# Patient Record
Sex: Male | Born: 1940 | Race: White | Hispanic: No | Smoking: Former smoker
Health system: Southern US, Community
[De-identification: ages and names within clinical notes are randomized; demographics above are authoritative.]

## PROBLEM LIST (undated history)

## (undated) DIAGNOSIS — R7303 Prediabetes: Secondary | ICD-10-CM

## (undated) DIAGNOSIS — I5032 Chronic diastolic (congestive) heart failure: Secondary | ICD-10-CM

## (undated) DIAGNOSIS — E785 Hyperlipidemia, unspecified: Secondary | ICD-10-CM

## (undated) DIAGNOSIS — R0600 Dyspnea, unspecified: Secondary | ICD-10-CM

## (undated) DIAGNOSIS — G4733 Obstructive sleep apnea (adult) (pediatric): Secondary | ICD-10-CM

## (undated) DIAGNOSIS — Z9989 Dependence on other enabling machines and devices: Secondary | ICD-10-CM

## (undated) DIAGNOSIS — I872 Venous insufficiency (chronic) (peripheral): Secondary | ICD-10-CM

## (undated) DIAGNOSIS — I509 Heart failure, unspecified: Secondary | ICD-10-CM

## (undated) DIAGNOSIS — D499 Neoplasm of unspecified behavior of unspecified site: Secondary | ICD-10-CM

## (undated) DIAGNOSIS — Z87442 Personal history of urinary calculi: Secondary | ICD-10-CM

## (undated) DIAGNOSIS — R159 Full incontinence of feces: Secondary | ICD-10-CM

## (undated) DIAGNOSIS — I714 Abdominal aortic aneurysm, without rupture, unspecified: Secondary | ICD-10-CM

## (undated) DIAGNOSIS — J189 Pneumonia, unspecified organism: Secondary | ICD-10-CM

## (undated) DIAGNOSIS — E669 Obesity, unspecified: Secondary | ICD-10-CM

## (undated) DIAGNOSIS — R42 Dizziness and giddiness: Secondary | ICD-10-CM

## (undated) DIAGNOSIS — C801 Malignant (primary) neoplasm, unspecified: Secondary | ICD-10-CM

## (undated) DIAGNOSIS — Z860101 Personal history of adenomatous and serrated colon polyps: Secondary | ICD-10-CM

## (undated) DIAGNOSIS — N200 Calculus of kidney: Secondary | ICD-10-CM

## (undated) DIAGNOSIS — M1990 Unspecified osteoarthritis, unspecified site: Secondary | ICD-10-CM

## (undated) DIAGNOSIS — I499 Cardiac arrhythmia, unspecified: Secondary | ICD-10-CM

## (undated) DIAGNOSIS — I272 Pulmonary hypertension, unspecified: Secondary | ICD-10-CM

## (undated) DIAGNOSIS — I10 Essential (primary) hypertension: Secondary | ICD-10-CM

## (undated) DIAGNOSIS — I739 Peripheral vascular disease, unspecified: Secondary | ICD-10-CM

## (undated) DIAGNOSIS — N12 Tubulo-interstitial nephritis, not specified as acute or chronic: Secondary | ICD-10-CM

## (undated) DIAGNOSIS — Z86010 Personal history of colonic polyps: Secondary | ICD-10-CM

## (undated) DIAGNOSIS — I4811 Longstanding persistent atrial fibrillation: Secondary | ICD-10-CM

## (undated) HISTORY — DX: Pulmonary hypertension, unspecified: I27.20

## (undated) HISTORY — DX: Venous insufficiency (chronic) (peripheral): I87.2

## (undated) HISTORY — DX: Essential (primary) hypertension: I10

## (undated) HISTORY — DX: Obstructive sleep apnea (adult) (pediatric): G47.33

## (undated) HISTORY — PX: OTHER SURGICAL HISTORY: SHX169

## (undated) HISTORY — DX: Longstanding persistent atrial fibrillation: I48.11

## (undated) HISTORY — PX: LIPOMA EXCISION: SHX5283

## (undated) HISTORY — DX: Obstructive sleep apnea (adult) (pediatric): Z99.89

## (undated) HISTORY — DX: Chronic diastolic (congestive) heart failure: I50.32

## (undated) HISTORY — PX: ESOPHAGOGASTRODUODENOSCOPY: SHX1529

## (undated) HISTORY — PX: COLONOSCOPY: SHX174

## (undated) HISTORY — DX: Obesity, unspecified: E66.9

## (undated) HISTORY — DX: Hyperlipidemia, unspecified: E78.5

## (undated) HISTORY — PX: TRANSTHORACIC ECHOCARDIOGRAM: SHX275

---
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
## 2005-11-24 HISTORY — PX: NM MYOVIEW LTD: HXRAD82

## 2009-08-26 HISTORY — PX: OTHER SURGICAL HISTORY: SHX169

## 2009-10-27 HISTORY — PX: CARDIAC CATHETERIZATION: SHX172

## 2010-02-24 HISTORY — PX: TRANSTHORACIC ECHOCARDIOGRAM: SHX275

## 2013-09-26 DIAGNOSIS — I872 Venous insufficiency (chronic) (peripheral): Secondary | ICD-10-CM

## 2013-09-26 HISTORY — DX: Venous insufficiency (chronic) (peripheral): I87.2

## 2014-07-27 HISTORY — PX: OTHER SURGICAL HISTORY: SHX169

## 2014-08-26 HISTORY — PX: IR TRANSCATH PLC STENT  INITIAL VEIN  INC ANGIOPLASTY: IMG5445

## 2014-10-27 HISTORY — PX: IR TRANSCATH PLC STENT  EA ADD VEIN  INC ANGIOPLASTY: IMG5446

## 2014-11-25 HISTORY — PX: OTHER SURGICAL HISTORY: SHX169

## 2016-10-31 DIAGNOSIS — Z79899 Other long term (current) drug therapy: Secondary | ICD-10-CM | POA: Diagnosis not present

## 2016-10-31 DIAGNOSIS — Z008 Encounter for other general examination: Secondary | ICD-10-CM | POA: Diagnosis not present

## 2016-10-31 DIAGNOSIS — I10 Essential (primary) hypertension: Secondary | ICD-10-CM | POA: Diagnosis not present

## 2016-10-31 DIAGNOSIS — E785 Hyperlipidemia, unspecified: Secondary | ICD-10-CM | POA: Diagnosis not present

## 2016-11-03 DIAGNOSIS — I872 Venous insufficiency (chronic) (peripheral): Secondary | ICD-10-CM | POA: Diagnosis not present

## 2016-11-03 DIAGNOSIS — I871 Compression of vein: Secondary | ICD-10-CM | POA: Diagnosis not present

## 2016-11-03 DIAGNOSIS — N189 Chronic kidney disease, unspecified: Secondary | ICD-10-CM | POA: Diagnosis not present

## 2016-11-03 DIAGNOSIS — I420 Dilated cardiomyopathy: Secondary | ICD-10-CM | POA: Diagnosis not present

## 2016-11-03 DIAGNOSIS — I4891 Unspecified atrial fibrillation: Secondary | ICD-10-CM | POA: Diagnosis not present

## 2016-11-03 DIAGNOSIS — I10 Essential (primary) hypertension: Secondary | ICD-10-CM | POA: Diagnosis not present

## 2016-11-03 DIAGNOSIS — I2510 Atherosclerotic heart disease of native coronary artery without angina pectoris: Secondary | ICD-10-CM | POA: Diagnosis not present

## 2016-11-03 DIAGNOSIS — I509 Heart failure, unspecified: Secondary | ICD-10-CM | POA: Diagnosis not present

## 2016-11-03 DIAGNOSIS — E784 Other hyperlipidemia: Secondary | ICD-10-CM | POA: Diagnosis not present

## 2016-11-03 DIAGNOSIS — I270 Primary pulmonary hypertension: Secondary | ICD-10-CM | POA: Diagnosis not present

## 2016-11-22 DIAGNOSIS — B351 Tinea unguium: Secondary | ICD-10-CM | POA: Diagnosis not present

## 2016-11-22 DIAGNOSIS — I739 Peripheral vascular disease, unspecified: Secondary | ICD-10-CM | POA: Diagnosis not present

## 2016-11-22 DIAGNOSIS — L97519 Non-pressure chronic ulcer of other part of right foot with unspecified severity: Secondary | ICD-10-CM | POA: Diagnosis not present

## 2017-01-27 DIAGNOSIS — E782 Mixed hyperlipidemia: Secondary | ICD-10-CM | POA: Diagnosis not present

## 2017-01-27 DIAGNOSIS — I509 Heart failure, unspecified: Secondary | ICD-10-CM | POA: Diagnosis not present

## 2017-01-27 DIAGNOSIS — I700 Atherosclerosis of aorta: Secondary | ICD-10-CM | POA: Diagnosis not present

## 2017-01-27 DIAGNOSIS — I4891 Unspecified atrial fibrillation: Secondary | ICD-10-CM | POA: Diagnosis not present

## 2017-01-27 DIAGNOSIS — L97519 Non-pressure chronic ulcer of other part of right foot with unspecified severity: Secondary | ICD-10-CM | POA: Diagnosis not present

## 2017-01-27 DIAGNOSIS — R7301 Impaired fasting glucose: Secondary | ICD-10-CM | POA: Diagnosis not present

## 2017-02-01 DIAGNOSIS — Z6841 Body Mass Index (BMI) 40.0 and over, adult: Secondary | ICD-10-CM | POA: Diagnosis not present

## 2017-02-01 DIAGNOSIS — I4891 Unspecified atrial fibrillation: Secondary | ICD-10-CM | POA: Diagnosis not present

## 2017-02-01 DIAGNOSIS — I890 Lymphedema, not elsewhere classified: Secondary | ICD-10-CM | POA: Diagnosis not present

## 2017-02-01 DIAGNOSIS — I700 Atherosclerosis of aorta: Secondary | ICD-10-CM | POA: Diagnosis not present

## 2017-02-01 DIAGNOSIS — I272 Pulmonary hypertension, unspecified: Secondary | ICD-10-CM | POA: Diagnosis not present

## 2017-02-01 DIAGNOSIS — E782 Mixed hyperlipidemia: Secondary | ICD-10-CM | POA: Diagnosis not present

## 2017-02-01 DIAGNOSIS — R32 Unspecified urinary incontinence: Secondary | ICD-10-CM | POA: Diagnosis not present

## 2017-02-01 DIAGNOSIS — I509 Heart failure, unspecified: Secondary | ICD-10-CM | POA: Diagnosis not present

## 2017-02-01 DIAGNOSIS — Z87891 Personal history of nicotine dependence: Secondary | ICD-10-CM | POA: Diagnosis not present

## 2017-02-02 DIAGNOSIS — I509 Heart failure, unspecified: Secondary | ICD-10-CM | POA: Diagnosis not present

## 2017-02-02 DIAGNOSIS — I871 Compression of vein: Secondary | ICD-10-CM | POA: Diagnosis not present

## 2017-02-02 DIAGNOSIS — N189 Chronic kidney disease, unspecified: Secondary | ICD-10-CM | POA: Diagnosis not present

## 2017-02-02 DIAGNOSIS — I4891 Unspecified atrial fibrillation: Secondary | ICD-10-CM | POA: Diagnosis not present

## 2017-02-02 DIAGNOSIS — E784 Other hyperlipidemia: Secondary | ICD-10-CM | POA: Diagnosis not present

## 2017-02-02 DIAGNOSIS — I872 Venous insufficiency (chronic) (peripheral): Secondary | ICD-10-CM | POA: Diagnosis not present

## 2017-02-02 DIAGNOSIS — I10 Essential (primary) hypertension: Secondary | ICD-10-CM | POA: Diagnosis not present

## 2017-02-02 DIAGNOSIS — I420 Dilated cardiomyopathy: Secondary | ICD-10-CM | POA: Diagnosis not present

## 2017-02-02 DIAGNOSIS — I270 Primary pulmonary hypertension: Secondary | ICD-10-CM | POA: Diagnosis not present

## 2017-02-02 DIAGNOSIS — I2510 Atherosclerotic heart disease of native coronary artery without angina pectoris: Secondary | ICD-10-CM | POA: Diagnosis not present

## 2017-02-22 DIAGNOSIS — E119 Type 2 diabetes mellitus without complications: Secondary | ICD-10-CM | POA: Diagnosis not present

## 2017-03-02 DIAGNOSIS — N359 Urethral stricture, unspecified: Secondary | ICD-10-CM | POA: Diagnosis not present

## 2017-03-02 DIAGNOSIS — N3281 Overactive bladder: Secondary | ICD-10-CM | POA: Diagnosis not present

## 2017-03-02 DIAGNOSIS — R32 Unspecified urinary incontinence: Secondary | ICD-10-CM | POA: Diagnosis not present

## 2017-03-02 DIAGNOSIS — R351 Nocturia: Secondary | ICD-10-CM | POA: Diagnosis not present

## 2017-03-06 DIAGNOSIS — R2689 Other abnormalities of gait and mobility: Secondary | ICD-10-CM | POA: Diagnosis not present

## 2017-03-06 DIAGNOSIS — I890 Lymphedema, not elsewhere classified: Secondary | ICD-10-CM | POA: Diagnosis not present

## 2017-03-07 DIAGNOSIS — B070 Plantar wart: Secondary | ICD-10-CM | POA: Diagnosis not present

## 2017-03-07 DIAGNOSIS — L859 Epidermal thickening, unspecified: Secondary | ICD-10-CM | POA: Diagnosis not present

## 2017-03-07 DIAGNOSIS — B351 Tinea unguium: Secondary | ICD-10-CM | POA: Diagnosis not present

## 2017-03-07 DIAGNOSIS — I890 Lymphedema, not elsewhere classified: Secondary | ICD-10-CM | POA: Diagnosis not present

## 2017-03-07 DIAGNOSIS — I739 Peripheral vascular disease, unspecified: Secondary | ICD-10-CM | POA: Diagnosis not present

## 2017-03-08 DIAGNOSIS — R2689 Other abnormalities of gait and mobility: Secondary | ICD-10-CM | POA: Diagnosis not present

## 2017-03-08 DIAGNOSIS — I890 Lymphedema, not elsewhere classified: Secondary | ICD-10-CM | POA: Diagnosis not present

## 2017-03-10 DIAGNOSIS — I890 Lymphedema, not elsewhere classified: Secondary | ICD-10-CM | POA: Diagnosis not present

## 2017-03-10 DIAGNOSIS — R2689 Other abnormalities of gait and mobility: Secondary | ICD-10-CM | POA: Diagnosis not present

## 2017-03-13 DIAGNOSIS — R2689 Other abnormalities of gait and mobility: Secondary | ICD-10-CM | POA: Diagnosis not present

## 2017-03-13 DIAGNOSIS — I890 Lymphedema, not elsewhere classified: Secondary | ICD-10-CM | POA: Diagnosis not present

## 2017-03-15 DIAGNOSIS — R609 Edema, unspecified: Secondary | ICD-10-CM | POA: Diagnosis not present

## 2017-03-15 DIAGNOSIS — I890 Lymphedema, not elsewhere classified: Secondary | ICD-10-CM | POA: Diagnosis not present

## 2017-03-15 DIAGNOSIS — R2689 Other abnormalities of gait and mobility: Secondary | ICD-10-CM | POA: Diagnosis not present

## 2017-03-17 DIAGNOSIS — R2689 Other abnormalities of gait and mobility: Secondary | ICD-10-CM | POA: Diagnosis not present

## 2017-03-17 DIAGNOSIS — I890 Lymphedema, not elsewhere classified: Secondary | ICD-10-CM | POA: Diagnosis not present

## 2017-03-20 DIAGNOSIS — I890 Lymphedema, not elsewhere classified: Secondary | ICD-10-CM | POA: Diagnosis not present

## 2017-03-20 DIAGNOSIS — N359 Urethral stricture, unspecified: Secondary | ICD-10-CM | POA: Diagnosis not present

## 2017-03-20 DIAGNOSIS — R2689 Other abnormalities of gait and mobility: Secondary | ICD-10-CM | POA: Diagnosis not present

## 2017-03-22 DIAGNOSIS — R2689 Other abnormalities of gait and mobility: Secondary | ICD-10-CM | POA: Diagnosis not present

## 2017-03-22 DIAGNOSIS — I890 Lymphedema, not elsewhere classified: Secondary | ICD-10-CM | POA: Diagnosis not present

## 2017-03-24 DIAGNOSIS — I890 Lymphedema, not elsewhere classified: Secondary | ICD-10-CM | POA: Diagnosis not present

## 2017-03-24 DIAGNOSIS — R2689 Other abnormalities of gait and mobility: Secondary | ICD-10-CM | POA: Diagnosis not present

## 2017-03-27 DIAGNOSIS — I890 Lymphedema, not elsewhere classified: Secondary | ICD-10-CM | POA: Diagnosis not present

## 2017-03-27 DIAGNOSIS — R2689 Other abnormalities of gait and mobility: Secondary | ICD-10-CM | POA: Diagnosis not present

## 2017-03-29 DIAGNOSIS — I890 Lymphedema, not elsewhere classified: Secondary | ICD-10-CM | POA: Diagnosis not present

## 2017-03-29 DIAGNOSIS — R2689 Other abnormalities of gait and mobility: Secondary | ICD-10-CM | POA: Diagnosis not present

## 2017-03-30 DIAGNOSIS — R609 Edema, unspecified: Secondary | ICD-10-CM | POA: Diagnosis not present

## 2017-03-30 DIAGNOSIS — I890 Lymphedema, not elsewhere classified: Secondary | ICD-10-CM | POA: Diagnosis not present

## 2017-03-31 DIAGNOSIS — R2689 Other abnormalities of gait and mobility: Secondary | ICD-10-CM | POA: Diagnosis not present

## 2017-03-31 DIAGNOSIS — I890 Lymphedema, not elsewhere classified: Secondary | ICD-10-CM | POA: Diagnosis not present

## 2017-04-03 DIAGNOSIS — R2689 Other abnormalities of gait and mobility: Secondary | ICD-10-CM | POA: Diagnosis not present

## 2017-04-03 DIAGNOSIS — I890 Lymphedema, not elsewhere classified: Secondary | ICD-10-CM | POA: Diagnosis not present

## 2017-04-05 DIAGNOSIS — R2689 Other abnormalities of gait and mobility: Secondary | ICD-10-CM | POA: Diagnosis not present

## 2017-04-05 DIAGNOSIS — I890 Lymphedema, not elsewhere classified: Secondary | ICD-10-CM | POA: Diagnosis not present

## 2017-04-10 DIAGNOSIS — N359 Urethral stricture, unspecified: Secondary | ICD-10-CM | POA: Diagnosis not present

## 2017-04-10 DIAGNOSIS — R350 Frequency of micturition: Secondary | ICD-10-CM | POA: Diagnosis not present

## 2017-04-18 DIAGNOSIS — L859 Epidermal thickening, unspecified: Secondary | ICD-10-CM | POA: Diagnosis not present

## 2017-04-18 DIAGNOSIS — I739 Peripheral vascular disease, unspecified: Secondary | ICD-10-CM | POA: Diagnosis not present

## 2017-04-18 DIAGNOSIS — B070 Plantar wart: Secondary | ICD-10-CM | POA: Diagnosis not present

## 2017-04-18 DIAGNOSIS — I890 Lymphedema, not elsewhere classified: Secondary | ICD-10-CM | POA: Diagnosis not present

## 2017-04-26 DIAGNOSIS — I4891 Unspecified atrial fibrillation: Secondary | ICD-10-CM | POA: Diagnosis not present

## 2017-04-26 HISTORY — PX: TRANSTHORACIC ECHOCARDIOGRAM: SHX275

## 2017-04-28 DIAGNOSIS — R7301 Impaired fasting glucose: Secondary | ICD-10-CM | POA: Diagnosis not present

## 2017-04-28 DIAGNOSIS — E782 Mixed hyperlipidemia: Secondary | ICD-10-CM | POA: Diagnosis not present

## 2017-04-28 DIAGNOSIS — I4891 Unspecified atrial fibrillation: Secondary | ICD-10-CM | POA: Diagnosis not present

## 2017-04-28 DIAGNOSIS — I509 Heart failure, unspecified: Secondary | ICD-10-CM | POA: Diagnosis not present

## 2017-04-28 DIAGNOSIS — L97519 Non-pressure chronic ulcer of other part of right foot with unspecified severity: Secondary | ICD-10-CM | POA: Diagnosis not present

## 2017-04-28 DIAGNOSIS — I700 Atherosclerosis of aorta: Secondary | ICD-10-CM | POA: Diagnosis not present

## 2017-05-03 DIAGNOSIS — I4891 Unspecified atrial fibrillation: Secondary | ICD-10-CM | POA: Diagnosis not present

## 2017-05-03 DIAGNOSIS — I700 Atherosclerosis of aorta: Secondary | ICD-10-CM | POA: Diagnosis not present

## 2017-05-03 DIAGNOSIS — I509 Heart failure, unspecified: Secondary | ICD-10-CM | POA: Diagnosis not present

## 2017-05-03 DIAGNOSIS — Z87891 Personal history of nicotine dependence: Secondary | ICD-10-CM | POA: Diagnosis not present

## 2017-05-03 DIAGNOSIS — I272 Pulmonary hypertension, unspecified: Secondary | ICD-10-CM | POA: Diagnosis not present

## 2017-05-03 DIAGNOSIS — I739 Peripheral vascular disease, unspecified: Secondary | ICD-10-CM | POA: Diagnosis not present

## 2017-05-03 DIAGNOSIS — N183 Chronic kidney disease, stage 3 (moderate): Secondary | ICD-10-CM | POA: Diagnosis not present

## 2017-05-03 DIAGNOSIS — R7301 Impaired fasting glucose: Secondary | ICD-10-CM | POA: Diagnosis not present

## 2017-05-03 DIAGNOSIS — Z6841 Body Mass Index (BMI) 40.0 and over, adult: Secondary | ICD-10-CM | POA: Diagnosis not present

## 2017-05-03 DIAGNOSIS — I890 Lymphedema, not elsewhere classified: Secondary | ICD-10-CM | POA: Diagnosis not present

## 2017-05-03 DIAGNOSIS — E782 Mixed hyperlipidemia: Secondary | ICD-10-CM | POA: Diagnosis not present

## 2017-05-04 DIAGNOSIS — I872 Venous insufficiency (chronic) (peripheral): Secondary | ICD-10-CM | POA: Diagnosis not present

## 2017-05-04 DIAGNOSIS — I871 Compression of vein: Secondary | ICD-10-CM | POA: Diagnosis not present

## 2017-05-04 DIAGNOSIS — N189 Chronic kidney disease, unspecified: Secondary | ICD-10-CM | POA: Diagnosis not present

## 2017-05-04 DIAGNOSIS — I420 Dilated cardiomyopathy: Secondary | ICD-10-CM | POA: Diagnosis not present

## 2017-05-04 DIAGNOSIS — I270 Primary pulmonary hypertension: Secondary | ICD-10-CM | POA: Diagnosis not present

## 2017-05-04 DIAGNOSIS — E784 Other hyperlipidemia: Secondary | ICD-10-CM | POA: Diagnosis not present

## 2017-05-04 DIAGNOSIS — I509 Heart failure, unspecified: Secondary | ICD-10-CM | POA: Diagnosis not present

## 2017-05-04 DIAGNOSIS — I10 Essential (primary) hypertension: Secondary | ICD-10-CM | POA: Diagnosis not present

## 2017-05-04 DIAGNOSIS — I4891 Unspecified atrial fibrillation: Secondary | ICD-10-CM | POA: Diagnosis not present

## 2017-05-04 DIAGNOSIS — I2510 Atherosclerotic heart disease of native coronary artery without angina pectoris: Secondary | ICD-10-CM | POA: Diagnosis not present

## 2017-05-30 DIAGNOSIS — I739 Peripheral vascular disease, unspecified: Secondary | ICD-10-CM | POA: Diagnosis not present

## 2017-05-30 DIAGNOSIS — N183 Chronic kidney disease, stage 3 (moderate): Secondary | ICD-10-CM | POA: Diagnosis not present

## 2017-05-30 DIAGNOSIS — I83899 Varicose veins of unspecified lower extremities with other complications: Secondary | ICD-10-CM | POA: Diagnosis not present

## 2017-05-30 DIAGNOSIS — B351 Tinea unguium: Secondary | ICD-10-CM | POA: Diagnosis not present

## 2017-07-11 DIAGNOSIS — I890 Lymphedema, not elsewhere classified: Secondary | ICD-10-CM | POA: Diagnosis not present

## 2017-07-11 DIAGNOSIS — I739 Peripheral vascular disease, unspecified: Secondary | ICD-10-CM | POA: Diagnosis not present

## 2017-07-11 DIAGNOSIS — B351 Tinea unguium: Secondary | ICD-10-CM | POA: Diagnosis not present

## 2017-08-03 DIAGNOSIS — I509 Heart failure, unspecified: Secondary | ICD-10-CM | POA: Diagnosis not present

## 2017-08-03 DIAGNOSIS — I2510 Atherosclerotic heart disease of native coronary artery without angina pectoris: Secondary | ICD-10-CM | POA: Diagnosis not present

## 2017-08-03 DIAGNOSIS — Z23 Encounter for immunization: Secondary | ICD-10-CM | POA: Diagnosis not present

## 2017-08-03 DIAGNOSIS — I420 Dilated cardiomyopathy: Secondary | ICD-10-CM | POA: Diagnosis not present

## 2017-08-03 DIAGNOSIS — Z6841 Body Mass Index (BMI) 40.0 and over, adult: Secondary | ICD-10-CM | POA: Diagnosis not present

## 2017-08-03 DIAGNOSIS — I4891 Unspecified atrial fibrillation: Secondary | ICD-10-CM | POA: Diagnosis not present

## 2017-08-03 DIAGNOSIS — R7301 Impaired fasting glucose: Secondary | ICD-10-CM | POA: Diagnosis not present

## 2017-08-03 DIAGNOSIS — I272 Pulmonary hypertension, unspecified: Secondary | ICD-10-CM | POA: Diagnosis not present

## 2017-08-03 DIAGNOSIS — I872 Venous insufficiency (chronic) (peripheral): Secondary | ICD-10-CM | POA: Diagnosis not present

## 2017-08-03 DIAGNOSIS — Z87891 Personal history of nicotine dependence: Secondary | ICD-10-CM | POA: Diagnosis not present

## 2017-08-03 DIAGNOSIS — N183 Chronic kidney disease, stage 3 (moderate): Secondary | ICD-10-CM | POA: Diagnosis not present

## 2017-08-03 DIAGNOSIS — I871 Compression of vein: Secondary | ICD-10-CM | POA: Diagnosis not present

## 2017-08-03 DIAGNOSIS — N189 Chronic kidney disease, unspecified: Secondary | ICD-10-CM | POA: Diagnosis not present

## 2017-08-03 DIAGNOSIS — I700 Atherosclerosis of aorta: Secondary | ICD-10-CM | POA: Diagnosis not present

## 2017-08-03 DIAGNOSIS — I270 Primary pulmonary hypertension: Secondary | ICD-10-CM | POA: Diagnosis not present

## 2017-08-03 DIAGNOSIS — I10 Essential (primary) hypertension: Secondary | ICD-10-CM | POA: Diagnosis not present

## 2017-09-04 DIAGNOSIS — H43812 Vitreous degeneration, left eye: Secondary | ICD-10-CM | POA: Diagnosis not present

## 2017-10-16 DIAGNOSIS — I4891 Unspecified atrial fibrillation: Secondary | ICD-10-CM | POA: Diagnosis not present

## 2017-10-16 DIAGNOSIS — R2681 Unsteadiness on feet: Secondary | ICD-10-CM | POA: Diagnosis not present

## 2017-10-16 DIAGNOSIS — I509 Heart failure, unspecified: Secondary | ICD-10-CM | POA: Diagnosis not present

## 2017-10-16 DIAGNOSIS — Z0000 Encounter for general adult medical examination without abnormal findings: Secondary | ICD-10-CM | POA: Diagnosis not present

## 2017-10-16 DIAGNOSIS — M6281 Muscle weakness (generalized): Secondary | ICD-10-CM | POA: Diagnosis not present

## 2017-10-16 DIAGNOSIS — G8929 Other chronic pain: Secondary | ICD-10-CM | POA: Diagnosis not present

## 2017-10-16 DIAGNOSIS — I10 Essential (primary) hypertension: Secondary | ICD-10-CM | POA: Diagnosis not present

## 2017-10-16 DIAGNOSIS — M1990 Unspecified osteoarthritis, unspecified site: Secondary | ICD-10-CM | POA: Diagnosis not present

## 2017-11-02 ENCOUNTER — Telehealth: Payer: Self-pay | Admitting: Cardiology

## 2017-11-02 NOTE — Telephone Encounter (Signed)
On 11/02/17 - received incoming records from [facility] for upcoming appointment on 11/14/17 @ 1:40 pm with Dr. Ellyn Hack . Records given to [facility] in Medical Records. 11/02/17 ab

## 2017-11-10 DIAGNOSIS — I110 Hypertensive heart disease with heart failure: Secondary | ICD-10-CM | POA: Diagnosis not present

## 2017-11-10 DIAGNOSIS — R2681 Unsteadiness on feet: Secondary | ICD-10-CM | POA: Diagnosis not present

## 2017-11-10 DIAGNOSIS — I482 Chronic atrial fibrillation: Secondary | ICD-10-CM | POA: Diagnosis not present

## 2017-11-10 DIAGNOSIS — I5032 Chronic diastolic (congestive) heart failure: Secondary | ICD-10-CM | POA: Diagnosis not present

## 2017-11-10 DIAGNOSIS — M6281 Muscle weakness (generalized): Secondary | ICD-10-CM | POA: Diagnosis not present

## 2017-11-10 DIAGNOSIS — Z7901 Long term (current) use of anticoagulants: Secondary | ICD-10-CM | POA: Diagnosis not present

## 2017-11-14 ENCOUNTER — Ambulatory Visit (INDEPENDENT_AMBULATORY_CARE_PROVIDER_SITE_OTHER): Payer: Medicare Other | Admitting: Cardiology

## 2017-11-14 ENCOUNTER — Encounter: Payer: Self-pay | Admitting: Cardiology

## 2017-11-14 VITALS — BP 129/65 | HR 57 | Ht 73.0 in | Wt 368.0 lb

## 2017-11-14 DIAGNOSIS — I10 Essential (primary) hypertension: Secondary | ICD-10-CM | POA: Diagnosis not present

## 2017-11-14 DIAGNOSIS — I272 Pulmonary hypertension, unspecified: Secondary | ICD-10-CM | POA: Insufficient documentation

## 2017-11-14 DIAGNOSIS — Z9989 Dependence on other enabling machines and devices: Secondary | ICD-10-CM

## 2017-11-14 DIAGNOSIS — I482 Chronic atrial fibrillation: Secondary | ICD-10-CM | POA: Diagnosis not present

## 2017-11-14 DIAGNOSIS — I4811 Longstanding persistent atrial fibrillation: Secondary | ICD-10-CM

## 2017-11-14 DIAGNOSIS — G4733 Obstructive sleep apnea (adult) (pediatric): Secondary | ICD-10-CM | POA: Insufficient documentation

## 2017-11-14 DIAGNOSIS — I872 Venous insufficiency (chronic) (peripheral): Secondary | ICD-10-CM | POA: Diagnosis not present

## 2017-11-14 DIAGNOSIS — I5032 Chronic diastolic (congestive) heart failure: Secondary | ICD-10-CM | POA: Diagnosis not present

## 2017-11-14 DIAGNOSIS — I110 Hypertensive heart disease with heart failure: Secondary | ICD-10-CM | POA: Diagnosis not present

## 2017-11-14 DIAGNOSIS — R2681 Unsteadiness on feet: Secondary | ICD-10-CM | POA: Diagnosis not present

## 2017-11-14 DIAGNOSIS — I4821 Permanent atrial fibrillation: Secondary | ICD-10-CM | POA: Insufficient documentation

## 2017-11-14 DIAGNOSIS — E785 Hyperlipidemia, unspecified: Secondary | ICD-10-CM | POA: Diagnosis not present

## 2017-11-14 DIAGNOSIS — Z7901 Long term (current) use of anticoagulants: Secondary | ICD-10-CM | POA: Diagnosis not present

## 2017-11-14 DIAGNOSIS — M6281 Muscle weakness (generalized): Secondary | ICD-10-CM | POA: Diagnosis not present

## 2017-11-14 DIAGNOSIS — I5042 Chronic combined systolic (congestive) and diastolic (congestive) heart failure: Secondary | ICD-10-CM

## 2017-11-14 DIAGNOSIS — I871 Compression of vein: Secondary | ICD-10-CM | POA: Diagnosis not present

## 2017-11-14 DIAGNOSIS — I481 Persistent atrial fibrillation: Secondary | ICD-10-CM | POA: Diagnosis not present

## 2017-11-14 MED ORDER — FUROSEMIDE 40 MG PO TABS: 40.0000 mg | ORAL_TABLET | Freq: Every day | ORAL | 3 refills | Status: DC

## 2017-11-14 MED ORDER — XARELTO 20 MG PO TABS: 20.0000 mg | ORAL_TABLET | Freq: Every day | ORAL | 3 refills | Status: DC

## 2017-11-14 MED ORDER — AMLODIPINE BESYLATE 10 MG PO TABS: 10.0000 mg | ORAL_TABLET | Freq: Every day | ORAL | 3 refills | Status: DC

## 2017-11-14 MED ORDER — METOPROLOL TARTRATE 25 MG PO TABS: 25.0000 mg | ORAL_TABLET | Freq: Two times a day (BID) | ORAL | 3 refills | Status: DC

## 2017-11-14 MED ORDER — ATORVASTATIN CALCIUM 10 MG PO TABS: 10.0000 mg | ORAL_TABLET | Freq: Every day | ORAL | 3 refills | Status: DC

## 2017-11-14 NOTE — Progress Notes (Signed)
PCP: Patrick Cuff, MD  Clinic Note: Chief Complaint  Patient presents with  . New Patient (Initial Visit)    Establish cardiology care, chronic A. fib and chronic venous stasis.    HPI:  Patrick Bishop is a 77 y.o. male who is being seen today for establishing Cardiology Care for Chronic Atrial Fibrillation, HFPEF, Chronic Venous Stasis & OSA at the request of Patrick Cuff, MD.  Patrick Bishop was last seen in Nov 2018 by his former Cardiologist: Dr. Saverio Bishop ([place]) Recent Hospitalizations: none  Studies Personally Reviewed - (if available, images/films reviewed: From Epic Chart or Care Everywhere) - The patient had a several thousand age packet of clinic notes from every 3 month visit since 2010, annual echo evaluations as well as several other reports. All reviewed. Pertinent data scanned. I did not scan all the echoes dating back to 2012 --> review of prior chart data and updating medical history: Close to 1 hour [facility] - Cardiologist Dr. Cleda Bishop; Venous Intervention - Dr. Eugenie Bishop.   Myocardial PET scan December 2010: Inferolateral partial reversible defect  Cardiac Catheterization February 2011: LAD 20%. RCA 30%. LVEF 45%. Mild pulmonary hypertension.  ECHOCARDIOGRAPHY (annual since 2011)   04/26/2017: Normal global function: EF 60-65%. Moderate concentric LVH. Moderate TR. Moderate pulmonary hypertension.  July 2017: Normal global LV function. Moderate pulmonary rib cage. Biatrial enlargement. Moderate LVH.  July 2016: Technically Limited. Normal LV function. EF 55-6%. Mild to moderate biatrial enlargement. Trace-mild AI. Monitor moderate TR. RV pressure estimated 50 mmHg.  August 2014: Normal LV function. LVH. Mild MR; July 2013: Normal LV size and function. Mild LVH; July 2012 normal function. Mild LVH  June 2011: Normal global LV function. Mild LVH. EF 60%.; November 2010. Moderate LV dilation. Moderate LVH. Normal EF  50-55%. Mild to moderate pulmonary hypertension.   November 2015: Lower Abdominal Venous Reflux [place] Report: IVC patent, B/L Iliac V no DVT.    Reflux: +++ Deep Venous Reflux Bilaterally  R Com Iliac V 1101 ms,  Ext Iliac 1449 ms;; L Com Iliac 1227 ms, Ext Iliac 3807 ms  R SFJ (Sapheno-Femoral Junction) 998 ms;; L SFJ 1685 ms  GSV Bilaterally Closed (prior procedure).  R SSV - No Reflux;; L SSV p-d 610 ms & 555 ms (tortuous) - not significant   Ant Accessory Saph V - absent Reflux.   Bilateral Ilio-Femoral Venous Intervention:   December 2015: Right common iliac to common femoral venous stenting. 24 x 70 mm stent - from right common iliac-external iliac-right common femoral vein.   February 2016: Left femoral venography - for left pelvic venous intervention - balloon angioplasty with 18 x 16 mm balloon with 20 x 60 mm stent followed by 22 x 70 mm and 20 x 80 mm stents in sequential fashion. -- Stent placement from inferior vena cava into the common iliac, external iliac and common femoral vein of the left side.    Venous Doppler ultrasound lower extremity March 2016:   Normal right leg is Doppler with no deep venous or greater saphenous thrombosis. Distal external iliac, common femoral, proximal profunda femoral, proximal superficial femoral, distal superficial femoral and popliteal vein reflux noted.  Deep venous reflux Reflux noted in the left common femoral and proximal femoral veins. No DVT. -- Left common femoral vein measured 12.5 mm deep venous reflux of 1519ms and left common femoral and 2228ms and left proximal femoral vein. Cystic structure noted in the left popliteal vein measuring 7.6 x 1.4  x 3.8 cm  July 2016 (report not available): 2 TDS - bilateral peroneal veins not well seen. No evidence of acute DVT or superficial thrombosis in either extremity.  Interval History: Patrick Bishop presents here today for establishing new cardiology care.  He has been followed every 3 months by  his prior cardiologist with minimal change to management of the time. [name] has chronic persistent atrial fibrillation that he is relatively asymptomatic of.  He may be notes his heart rate going up fast every so often.  And he may take an extra beta-blocker dose for that.  But not very frequently.  The episodes last less than a minute. He is significantly obese and has profound bilateral lower extremity edema essentially lymphedema with from venous reflux.  He is very limited from a mobility standpoint and uses a walker.  Simple moving around the examining room made him dyspneic.  When asked about this, this is pretty much his baseline.  He has at least 4-5+ pitting edema bilaterally.  Thankfully, both legs do not have any lesions identified.  He is not able to wear compression stockings or Unaboot because every time they are removed, he has skin peeling issues. Both legs hurt, and limit his ability to walk, but mostly it is his right knee that limits his walking.  If I ask him which limits him more, it is probably his legs hurting him then more than dyspnea.  He denies any chest tightness or pressure with rest or exertion.  He does not lie flat at baseline, and does not sleep without his CPAP.  He states that he thinks he may be needs a new CPAP machine.  He does not note any orthopnea or PND. Despite having some rapid heartbeat episodes, he really is asymptomatic of his atrial fib.  He denies any significant rapid palpitations that would lead to any symptoms of syncope/near syncope or TIA/amaurosis fugax.  He denies any bleeding issues with Xarelto with no melena, hematochezia, hematuria or epistaxis.  He pretty much takes his standing dose of Lasix, but it really does not help with swelling  No claudication.  ROS: A comprehensive was performed. Pertinent Sx noted above Review of Systems  Constitutional: Positive for malaise/fatigue (no Energy). Negative for chills and fever.  HENT: Positive for  hearing loss. Negative for nosebleeds.   Eyes: Negative.        Needs Cataract Sgx   Respiratory: Positive for shortness of breath (Baseline). Negative for cough and sputum production.   Cardiovascular: Positive for leg swelling (See above - chronic). Negative for chest pain.  Gastrointestinal: Negative for abdominal pain, blood in stool, constipation, heartburn, melena and nausea.  Genitourinary: Positive for frequency (with lasix). Negative for hematuria.  Musculoskeletal: Positive for joint pain (+++ R Knee).  Neurological: Positive for dizziness (positional) and weakness (B Lega now weak). Negative for focal weakness, seizures and loss of consciousness.  Endo/Heme/Allergies: Bruises/bleeds easily (BL LE skin - thin - easy skin tears).  Psychiatric/Behavioral: Negative for depression and memory loss. The patient is not nervous/anxious and does not have insomnia.   All other systems reviewed and are negative.    I have reviewed and (if needed) personally updated the patient's problem list, medications, allergies, past medical and surgical history, social and family history.   Past Medical History:  Diagnosis Date  . Chronic combined systolic and diastolic HF (heart failure) ([place])   . Chronic venous insufficiency 2015   s/p Bilateral Iliac & Femoral Stents (including IVC) - DEEP  Venous Reflux.  Also s/p Bilateral GSV Ablation.  . Essential hypertension   . Hyperlipidemia with target LDL less than 100   . Longstanding persistent atrial fibrillation ([place])   . Obesity   . OSA on CPAP    May need New CPAP machine & CPAP MD)  . Pulmonary hypertension ([place])     Past Surgical History:  Procedure Laterality Date  . ABDOMINAL VENOUS DUPLEX Bilateral 07/2014   ([facility]: Dr. Cleda Bishop): (prior to Bilateral Iliac-Femoral Venous Stent Placement): Patent IVC.  No DVT B/L Iliac or Com fem V..  +++ DEEP V REFLUX B/L ILIAC & FEM V.  Absent Superficial V Reflux B/L x L SSV (too tortuous for  intervention).  BL GSV occluded - prior ablation.   . ABDOMINAL VENOUS DUPLEX Bilateral 11/2014   Absent DVT.  Left common femoral venous reflux: 1508 MS left common femoral vein, 2284MS left proximal femoral vein -> cystic structure noted in the left popliteal vein (7.6 x 1.4 x 3.8 cm).;  Right leg deep venous reflux noted in the distal iliac, common femoral, proximal profunda femoral, proximal superficial femoral, superficial femoral and popliteal vein.  [name] CARDIAC CATHETERIZATION  10/2009   [facility] - Dr. 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  EF~45%.  Mild pulmonary pretension.  [name] CARDIAC PET  08/2009   Inferolateral partially reversible defect -- > false positive by cath  . GREATER SAPHENOUS VEIN ABLATION Bilateral Before 2015   [facility] (Dr. Eugenie Bishop)  . IR TRANSCATH PLC STENT  EA ADD VEIN  INC ANGIOPLASTY Left 10/2014   [facility], Dr. 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 IR TRANSCATH PLC STENT  INITIAL VEIN  INC ANGIOPLASTY Right 08/2014   [facility] - Dr. Stormy Fabian): US Guided --> R Common & External Iliac, Common Femoral Venography with Placement of 24 mmx 70 mm Stent - R Com Iliac-Ext Iliac- & Com Fem V. reducing 60-80% venous compression to ~0%).;; also noted significant L-sided venous compression (staged stenting).   [name] NM MYOVIEW LTD  11/2005   Adenosine Myoview [facility] - Dr. Cleda Bishop): Normal myocardial perfusion scan. No ischemia or infarction.  . TRANSTHORACIC ECHOCARDIOGRAM  04/26/2017    [facility] - Dr. Cleda Bishop) - report not available.  Per clinic note: Normal global LV function. Mild concentric temperature. Mild TR. Mild pulmonary hypertension.;;   . TRANSTHORACIC  ECHOCARDIOGRAM  7/'15; 7/'17   [facility] - Dr. Saverio Bishop) a) low normal LV function (EF 56%). Moderate concentric LVH. Mild LA dilation. Normal pulmonary pressures.  ;; b) normal global function - EF 60-65%. Moderate pulmonary hypertension. Biatrial enlargement. Moderate concentric LVH.  [name] TRANSTHORACIC ECHOCARDIOGRAM  02/2010   [facility] - Dr. Saverio Bishop): Normal global LV function. Mild LVH. EF 60%.; November 2010. Moderate LV dilation. Moderate LVH. Normal EF 50-55%. Mild to moderate pulmonary hypertension. (July 2012 showed normal function with mild LVH, mild MR noted in August 2014)    Current Meds  Medication Sig  . amLODipine (NORVASC) 10 MG tablet Take 1 tablet (10 mg total) by mouth daily.  [name] aspirin 81 MG tablet Take 81 mg by mouth daily.  [name] atorvastatin (LIPITOR) 10 MG tablet Take 1 tablet (10 mg  total) by mouth daily at 6 PM.  . furosemide (LASIX) 40 MG tablet Take 1 tablet (40 mg total) by mouth daily. 1 tablet am 1/2 tablet pm ,may take an extra tablet if need for swelli  . HYDROcodone-acetaminophen (NORCO/VICODIN) 5-325 MG tablet   . metoprolol tartrate (LOPRESSOR) 25 MG tablet Take 1 tablet (25 mg total) by mouth 2 (two) times daily. May take an extra  25 mg tablet as directed daily.  Alveda Reasons 20 MG TABS tablet Take 1 tablet (20 mg total) by mouth daily with supper.  . [DISCONTINUED] amLODipine (NORVASC) 10 MG tablet   . [DISCONTINUED] atorvastatin (LIPITOR) 10 MG tablet   . [DISCONTINUED] furosemide (LASIX) 40 MG tablet Take 40 mg by mouth. 1 tablet am 1/2 tablet pm  . [DISCONTINUED] metoprolol tartrate (LOPRESSOR) 25 MG tablet Take 25 mg by mouth 2 (two) times daily.   . [DISCONTINUED] XARELTO 20 MG TABS tablet     Allergies  Allergen Reactions  . Aldactone [Spironolactone]     - unknown     Social History   Tobacco Use  . Smoking status: Former Smoker    Packs/day: 1.00    Years: 35.00    Pack years: 35.00    Types: Cigarettes     Last attempt to quit: 1996    Years since quitting: 23.1  . Smokeless tobacco: Never Used  . Tobacco comment: smoked from age [age]   Substance Use Topics  . Alcohol use: No    Frequency: Never    Comment: Not any more  . Drug use: No   Social History   Social History Narrative   Oden recently moved to [place] in January 2019.  This was to be close to his daughter who is here with him today.  He has 2 daughters and one grandchild.      Jacqualine Code,  [place] --PCP was Sherlynn Carbon, MD.  Cardiologist: Patrick Danker, MD (who followed him every 3 months with routine visits.  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   He currently lives at [place] [facility] (Dr. Albesa Seen) --however he hopes to be moving into his daughter's house once they have not established and ready for him.    family history includes Cancer in his mother and sister; Heart attack (age of onset: 13) in his father; Hypertension in his brother.  --For the most part, he is not fully aware of his family history.  They did not speak much about health care.  Wt Readings from Last 3 Encounters:  11/14/17 (!) 368 lb (166.9 kg)    PHYSICAL EXAM BP 129/65   Pulse (!) 57   Ht 6\' 1"  (1.854 m)   Wt (!) 368 lb (166.9 kg)   BMI 48.55 kg/m  Physical Exam  Constitutional: He is oriented to person, place, and time. He appears well-developed. No distress (As long as he does not move around the room.).  Super morbidly obese gentleman sitting on his walker.  Significant effort simply moving around the room makes him dyspneic.    HENT:  Head: Normocephalic and atraumatic.  Mouth/Throat: No oropharyngeal exudate.  A bit hard of hearing  Eyes: No scleral icterus.  Pupils are somewhat irregular but reactive to light  Neck: Normal range of motion. Neck supple. No hepatojugular reflux (Unable to assess due to body  habitus) and no JVD (Unable to assess JVD due to body habitus)  present. Carotid bruit is not present. No tracheal deviation present. No thyromegaly present.  Cardiovascular: Normal rate. An irregularly irregular rhythm present. PMI is not displaced (Cannot assess). Exam reveals distant heart sounds and decreased pulses (Unable to palpate pedal pulses.  Bilateral radial pulses are stable.). Exam reveals no gallop.  No murmur heard. Pulmonary/Chest: Effort normal and breath sounds normal. No respiratory distress. He has no wheezes. He has no rales. He exhibits no tenderness.  Distant breath sounds.  Abdominal: Soft. Bowel sounds are normal. He exhibits no distension. There is no tenderness.  Grossly obese  Musculoskeletal: He exhibits edema (3-4+ bilateral pitting edema with significant chronic venous stasis dermatitis changes/pigmentation noted.) and tenderness.  Significant decreased range of motion in right knee  Neurological: He is alert and oriented to person, place, and time. No cranial nerve deficit.  Skin: Skin is warm and dry. He is not diaphoretic.  Significant venous stasis pigmentation/dermatitis noted with "lumpy "very thin skin.  No obvious lesions or wounds noted.  Psychiatric: He has a normal mood and affect. His behavior is normal. Judgment and thought content normal.  Nursing note and vitals reviewed.    Adult ECG Report  Rate: 61;  Rhythm: atrial fibrillation and Mild artifact but seems like normal axis, intervals and durations.  Borderline voltage (likely related to body habitus);   Narrative Interpretation: Otherwise normal EKG  From prior cardiologist: EKG May 2018 showed A. fib with slow response, 50 bpm.  August 2017 A. fib slow response, 49 bpm.  June 2011 sinus rhythm.  Other studies Reviewed: Additional studies/ records that were reviewed today include:  Recent Labs: May 2018  Na+ 144, K+ 4.0, Cl- 109, HCO3-25, BUN 17, Cr 1.04, Glu 104, Ca2+ 8.9; AST 16, ALT 14,  AlkP 64,Alb 3.8 (total protein 6.3).  CBC: W 5.6, H/H 12.7/37.7, Plt 188; TIBC 261.  Ferritin 96.  Total iron 78.    T C 115, TG 56, HDL 38, LDL 63;  A1c 5.2.  TSH 1.57.  T4 5.2.   ASSESSMENT / PLAN: Problem List Items Addressed This Visit    Chronic combined systolic and diastolic HF (heart failure) (HCC) (Chronic)    Honestly, I do not know that this is a major issue for him --he really does not have that much in the way of heart failure symptoms at all.  He has had normal EF and no real significant diastolic dysfunction noted on his multiple echocardiographic evaluations.  He does have some mild to moderate pulmonary hypertension, likely related to obesity, OHS and OSA. He is pretty compensated, but clearly not euvolemic because of his profound lower extremity edema.  Plan: He seems to be relatively stable, therefore I plan to continue his current medication regimen with standing dose of Lasix 40 mg - 20 mg daily along with low-dose beta-blocker. He is on amlodipine, which I would eventually hope to switch to ARB in order to avoid potential edema in relation to amlodipine.      Relevant Medications   aspirin 81 MG tablet   XARELTO 20 MG TABS tablet   furosemide (LASIX) 40 MG tablet   atorvastatin (LIPITOR) 10 MG tablet   amLODipine (NORVASC) 10 MG tablet   metoprolol tartrate (LOPRESSOR) 25 MG tablet   Other Relevant Orders   EKG 12-Lead   Chronic venous insufficiency (Chronic)    This really seems to be his major issue.  He has significant bilateral deep venous reflux with bilateral iliac stent placement and bilateral GS V ablation.  He has significant edema for which she takes furosemide.  He probably needs wound care to help follow him. Based on the extent of venous insufficiency and stents, I plan to refer him to Dr. Donzetta Matters from vascular surgery in order to appropriately follow with the appropriate studies and possible procedures.      Relevant Medications   aspirin 81 MG  tablet   XARELTO 20 MG TABS tablet   furosemide (LASIX) 40 MG tablet   atorvastatin (LIPITOR) 10 MG tablet   amLODipine (NORVASC) 10 MG tablet   metoprolol tartrate (LOPRESSOR) 25 MG tablet   Other Relevant Orders   Ambulatory referral to Vascular Surgery   Essential hypertension (Chronic)    Well-controlled on low-dose amlodipine and metoprolol. -Plan to taper off amlodipine and switch to ARB and follow-up visits.      Relevant Medications   aspirin 81 MG tablet   XARELTO 20 MG TABS tablet   furosemide (LASIX) 40 MG tablet   atorvastatin (LIPITOR) 10 MG tablet   amLODipine (NORVASC) 10 MG tablet   metoprolol tartrate (LOPRESSOR) 25 MG tablet   Hyperlipidemia with target LDL less than 100 (Chronic)    Very well controlled on minimal dose of atorvastatin.      Relevant Medications   aspirin 81 MG tablet   XARELTO 20 MG TABS tablet   furosemide (LASIX) 40 MG tablet   atorvastatin (LIPITOR) 10 MG tablet   amLODipine (NORVASC) 10 MG tablet   metoprolol tartrate (LOPRESSOR) 25 MG tablet   Longstanding persistent atrial fibrillation (HCC) - Primary (Chronic)    As far as I can tell this dates back to at least 2011.  Pretty asymptomatic.  Rate is been well controlled with low-dose metoprolol.  He takes occasional as needed doses for faster heart rates.  Otherwise he is anti-coag related with Xarelto with no bleeding issues. No plans for antiarrhythmic.  Monitor for bradycardia.      Relevant Medications   aspirin 81 MG tablet   XARELTO 20 MG TABS tablet   furosemide (LASIX) 40 MG tablet   atorvastatin (LIPITOR) 10 MG tablet   amLODipine (NORVASC) 10 MG tablet   metoprolol tartrate (LOPRESSOR) 25 MG tablet   Other Relevant Orders   EKG 12-Lead   OSA on CPAP    Continue current CPAP regimen.  Will refer him to Dr. Claiborne Billings to establish care for sleep medicine, in order to be available for equipment needs etc.      Pulmonary hypertension ([place]) (Chronic)    Probably related to OSA  and OHS. --Intermittently mild to moderate on his multiple annual echocardiograms.  That may be why he is on amlodipine.  I do not think that is enough of a reason, he would be fine with afterload reduction with an ARB. Plan: Continue CPAP.      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 He now has persistent deep venous reflux with profound lower extremity edema that is probably come by a combination of venous reflux and lymphedema. Plan: Refer to Dr. Donzetta Matters from vascular  surgery for assistance with management /appropriate studies and skin/wound care assessment.      Relevant Medications   aspirin 81 MG tablet   XARELTO 20 MG TABS tablet   furosemide (LASIX) 40 MG tablet   atorvastatin (LIPITOR) 10 MG tablet   amLODipine (NORVASC) 10 MG tablet   metoprolol tartrate (LOPRESSOR) 25 MG tablet   Other Relevant Orders   Ambulatory referral to Vascular Surgery     Extensive past medical history review with well over an hour of chart review and preparation.  Difficult historian.  All told close to 2 hours spent on this patient.  40 minutes in the patient's room.  Current medicines are reviewed at length with the patient today. (+/- concerns) none, just needs refills The following changes have been made:None  Patient Instructions  No change with medications   You have been referred to DR Cain-vascular surgery He will also be referred to Dr. Shelva Majestic for for sleep medicine/OSA  Your physician wants you to follow-up in 6 month with DR Verita Kuroda.You will receive a reminder letter  in the mail two months in advance. If you don't receive a letter, please call our office to schedule the follow-up appointment.    If you need a refill on your cardiac medications before your next appointment, please call your pharmacy.    Studies Ordered:   Orders Placed This Encounter  Procedures  . Ambulatory referral to Vascular Surgery  . EKG 12-Lead      Glenetta Hew, M.D., M.S. Interventional Cardiologist   Pager # [phone] Phone # [phone] [address] [place], [place] [address]   Thank you for choosing Heart[facility] at [facility]!!

## 2017-11-14 NOTE — Assessment & Plan Note (Signed)
Honestly, I do not know that this is a major issue for him --he really does not have that much in the way of heart failure symptoms at all.  He has had normal EF and no real significant diastolic dysfunction noted on his multiple echocardiographic evaluations.  He does have some mild to moderate pulmonary hypertension, likely related to obesity, OHS and OSA. He is pretty compensated, but clearly not euvolemic because of his profound lower extremity edema.  Plan: He seems to be relatively stable, therefore I plan to continue his current medication regimen with standing dose of Lasix 40 mg - 20 mg daily along with low-dose beta-blocker. He is on amlodipine, which I would eventually hope to switch to ARB in order to avoid potential edema in relation to amlodipine.

## 2017-11-14 NOTE — Patient Instructions (Addendum)
No change with medications   You have been referred to DR Cain-vascular surgery He will also be referred to Dr. Shelva Majestic for for sleep medicine/OSA  Your physician wants you to follow-up in 6 month with DR HARDING.You will receive a reminder letter in the mail two months in advance. If you don't receive a letter, please call our office to schedule the follow-up appointment.    If you need a refill on your cardiac medications before your next appointment, please call your pharmacy.

## 2017-11-14 NOTE — Assessment & Plan Note (Signed)
This really seems to be his major issue.  He has significant bilateral deep venous reflux with bilateral iliac stent placement and bilateral GS V ablation.  He has significant edema for which she takes furosemide.  He probably needs wound care to help follow him. Based on the extent of venous insufficiency and stents, I plan to refer him to Dr. Donzetta Matters from vascular surgery in order to appropriately follow with the appropriate studies and possible procedures.

## 2017-11-15 NOTE — Assessment & Plan Note (Signed)
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 He now has persistent deep venous reflux with profound lower extremity edema that is probably come by a combination of venous reflux and lymphedema. Plan: Refer to Dr. Donzetta Matters from vascular surgery for assistance with management /appropriate studies and skin/wound care assessment.

## 2017-11-15 NOTE — Assessment & Plan Note (Signed)
As far as I can tell this dates back to at least 2011.  Pretty asymptomatic.  Rate is been well controlled with low-dose metoprolol.  He takes occasional as needed doses for faster heart rates.  Otherwise he is anti-coag related with Xarelto with no bleeding issues. No plans for antiarrhythmic.  Monitor for bradycardia.

## 2017-11-15 NOTE — Assessment & Plan Note (Signed)
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 --Intermittently mild to moderate on his multiple annual echocardiograms.  That may be why he is on amlodipine.  I do not think that is enough of a reason, he would be fine with afterload reduction with an ARB. Plan: Continue CPAP.

## 2017-11-15 NOTE — Assessment & Plan Note (Signed)
Very well controlled on minimal dose of atorvastatin.

## 2017-11-15 NOTE — Assessment & Plan Note (Signed)
Well-controlled on low-dose amlodipine and metoprolol. -Plan to taper off amlodipine and switch to ARB and follow-up visits.

## 2017-11-15 NOTE — Assessment & Plan Note (Signed)
Continue current CPAP regimen.  Will refer him to Dr. Claiborne Billings to establish care for sleep medicine, in order to be available for equipment needs etc.

## 2017-11-16 ENCOUNTER — Other Ambulatory Visit: Payer: Self-pay

## 2017-11-16 ENCOUNTER — Telehealth: Payer: Self-pay | Admitting: Cardiology

## 2017-11-16 DIAGNOSIS — R2681 Unsteadiness on feet: Secondary | ICD-10-CM | POA: Diagnosis not present

## 2017-11-16 DIAGNOSIS — G8929 Other chronic pain: Secondary | ICD-10-CM | POA: Diagnosis not present

## 2017-11-16 DIAGNOSIS — I10 Essential (primary) hypertension: Secondary | ICD-10-CM | POA: Diagnosis not present

## 2017-11-16 DIAGNOSIS — I509 Heart failure, unspecified: Secondary | ICD-10-CM | POA: Diagnosis not present

## 2017-11-16 DIAGNOSIS — I4891 Unspecified atrial fibrillation: Secondary | ICD-10-CM | POA: Diagnosis not present

## 2017-11-16 DIAGNOSIS — M6281 Muscle weakness (generalized): Secondary | ICD-10-CM | POA: Diagnosis not present

## 2017-11-16 DIAGNOSIS — I5032 Chronic diastolic (congestive) heart failure: Secondary | ICD-10-CM | POA: Diagnosis not present

## 2017-11-16 DIAGNOSIS — I110 Hypertensive heart disease with heart failure: Secondary | ICD-10-CM | POA: Diagnosis not present

## 2017-11-16 DIAGNOSIS — I482 Chronic atrial fibrillation: Secondary | ICD-10-CM | POA: Diagnosis not present

## 2017-11-16 DIAGNOSIS — Z7901 Long term (current) use of anticoagulants: Secondary | ICD-10-CM | POA: Diagnosis not present

## 2017-11-16 MED ORDER — FUROSEMIDE 40 MG PO TABS: ORAL_TABLET | ORAL | 3 refills | Status: DC

## 2017-11-16 NOTE — Telephone Encounter (Signed)
Returned call to [facility], aware rx is correct.   Patient takes 25 mg BID, additional 25 mg AS NEEDED.     Verbalized understanding.

## 2017-11-16 NOTE — Telephone Encounter (Signed)
New message     Order [phone]   Needs to hear back in 48 business hours on medication    Needs clarification on quantity and dosage for  metoprolol tartrate (LOPRESSOR) 25 MG tablet 190 tablet 3 11/14/2017

## 2017-11-20 ENCOUNTER — Other Ambulatory Visit: Payer: Self-pay

## 2017-11-20 DIAGNOSIS — I872 Venous insufficiency (chronic) (peripheral): Secondary | ICD-10-CM

## 2017-11-20 DIAGNOSIS — I5032 Chronic diastolic (congestive) heart failure: Secondary | ICD-10-CM | POA: Diagnosis not present

## 2017-11-20 DIAGNOSIS — R2681 Unsteadiness on feet: Secondary | ICD-10-CM | POA: Diagnosis not present

## 2017-11-20 DIAGNOSIS — Z7901 Long term (current) use of anticoagulants: Secondary | ICD-10-CM | POA: Diagnosis not present

## 2017-11-20 DIAGNOSIS — I482 Chronic atrial fibrillation: Secondary | ICD-10-CM | POA: Diagnosis not present

## 2017-11-20 DIAGNOSIS — M6281 Muscle weakness (generalized): Secondary | ICD-10-CM | POA: Diagnosis not present

## 2017-11-20 DIAGNOSIS — I110 Hypertensive heart disease with heart failure: Secondary | ICD-10-CM | POA: Diagnosis not present

## 2017-11-24 DIAGNOSIS — I5032 Chronic diastolic (congestive) heart failure: Secondary | ICD-10-CM | POA: Diagnosis not present

## 2017-11-24 DIAGNOSIS — I482 Chronic atrial fibrillation: Secondary | ICD-10-CM | POA: Diagnosis not present

## 2017-11-24 DIAGNOSIS — I110 Hypertensive heart disease with heart failure: Secondary | ICD-10-CM | POA: Diagnosis not present

## 2017-11-24 DIAGNOSIS — R2681 Unsteadiness on feet: Secondary | ICD-10-CM | POA: Diagnosis not present

## 2017-11-24 DIAGNOSIS — Z7901 Long term (current) use of anticoagulants: Secondary | ICD-10-CM | POA: Diagnosis not present

## 2017-11-24 DIAGNOSIS — M6281 Muscle weakness (generalized): Secondary | ICD-10-CM | POA: Diagnosis not present

## 2017-11-28 DIAGNOSIS — I5032 Chronic diastolic (congestive) heart failure: Secondary | ICD-10-CM | POA: Diagnosis not present

## 2017-11-28 DIAGNOSIS — R2681 Unsteadiness on feet: Secondary | ICD-10-CM | POA: Diagnosis not present

## 2017-11-28 DIAGNOSIS — M6281 Muscle weakness (generalized): Secondary | ICD-10-CM | POA: Diagnosis not present

## 2017-11-28 DIAGNOSIS — I110 Hypertensive heart disease with heart failure: Secondary | ICD-10-CM | POA: Diagnosis not present

## 2017-11-28 DIAGNOSIS — Z7901 Long term (current) use of anticoagulants: Secondary | ICD-10-CM | POA: Diagnosis not present

## 2017-11-28 DIAGNOSIS — I482 Chronic atrial fibrillation: Secondary | ICD-10-CM | POA: Diagnosis not present

## 2017-12-04 DIAGNOSIS — Z7901 Long term (current) use of anticoagulants: Secondary | ICD-10-CM | POA: Diagnosis not present

## 2017-12-04 DIAGNOSIS — I482 Chronic atrial fibrillation: Secondary | ICD-10-CM | POA: Diagnosis not present

## 2017-12-04 DIAGNOSIS — M6281 Muscle weakness (generalized): Secondary | ICD-10-CM | POA: Diagnosis not present

## 2017-12-04 DIAGNOSIS — I5032 Chronic diastolic (congestive) heart failure: Secondary | ICD-10-CM | POA: Diagnosis not present

## 2017-12-04 DIAGNOSIS — R2681 Unsteadiness on feet: Secondary | ICD-10-CM | POA: Diagnosis not present

## 2017-12-04 DIAGNOSIS — I110 Hypertensive heart disease with heart failure: Secondary | ICD-10-CM | POA: Diagnosis not present

## 2017-12-04 DIAGNOSIS — S3091XD Unspecified superficial injury of lower back and pelvis, subsequent encounter: Secondary | ICD-10-CM | POA: Diagnosis not present

## 2017-12-07 DIAGNOSIS — I5032 Chronic diastolic (congestive) heart failure: Secondary | ICD-10-CM | POA: Diagnosis not present

## 2017-12-07 DIAGNOSIS — I110 Hypertensive heart disease with heart failure: Secondary | ICD-10-CM | POA: Diagnosis not present

## 2017-12-07 DIAGNOSIS — M6281 Muscle weakness (generalized): Secondary | ICD-10-CM | POA: Diagnosis not present

## 2017-12-07 DIAGNOSIS — R2681 Unsteadiness on feet: Secondary | ICD-10-CM | POA: Diagnosis not present

## 2017-12-07 DIAGNOSIS — I482 Chronic atrial fibrillation: Secondary | ICD-10-CM | POA: Diagnosis not present

## 2017-12-07 DIAGNOSIS — Z7901 Long term (current) use of anticoagulants: Secondary | ICD-10-CM | POA: Diagnosis not present

## 2017-12-08 DIAGNOSIS — Z7901 Long term (current) use of anticoagulants: Secondary | ICD-10-CM | POA: Diagnosis not present

## 2017-12-08 DIAGNOSIS — R2681 Unsteadiness on feet: Secondary | ICD-10-CM | POA: Diagnosis not present

## 2017-12-08 DIAGNOSIS — I110 Hypertensive heart disease with heart failure: Secondary | ICD-10-CM | POA: Diagnosis not present

## 2017-12-08 DIAGNOSIS — I5032 Chronic diastolic (congestive) heart failure: Secondary | ICD-10-CM | POA: Diagnosis not present

## 2017-12-08 DIAGNOSIS — I482 Chronic atrial fibrillation: Secondary | ICD-10-CM | POA: Diagnosis not present

## 2017-12-08 DIAGNOSIS — M6281 Muscle weakness (generalized): Secondary | ICD-10-CM | POA: Diagnosis not present

## 2017-12-09 DIAGNOSIS — I110 Hypertensive heart disease with heart failure: Secondary | ICD-10-CM | POA: Diagnosis not present

## 2017-12-09 DIAGNOSIS — R2681 Unsteadiness on feet: Secondary | ICD-10-CM | POA: Diagnosis not present

## 2017-12-09 DIAGNOSIS — Z7901 Long term (current) use of anticoagulants: Secondary | ICD-10-CM | POA: Diagnosis not present

## 2017-12-09 DIAGNOSIS — I482 Chronic atrial fibrillation: Secondary | ICD-10-CM | POA: Diagnosis not present

## 2017-12-09 DIAGNOSIS — I5032 Chronic diastolic (congestive) heart failure: Secondary | ICD-10-CM | POA: Diagnosis not present

## 2017-12-09 DIAGNOSIS — M6281 Muscle weakness (generalized): Secondary | ICD-10-CM | POA: Diagnosis not present

## 2017-12-11 DIAGNOSIS — M6281 Muscle weakness (generalized): Secondary | ICD-10-CM | POA: Diagnosis not present

## 2017-12-11 DIAGNOSIS — R2681 Unsteadiness on feet: Secondary | ICD-10-CM | POA: Diagnosis not present

## 2017-12-11 DIAGNOSIS — I5032 Chronic diastolic (congestive) heart failure: Secondary | ICD-10-CM | POA: Diagnosis not present

## 2017-12-11 DIAGNOSIS — I110 Hypertensive heart disease with heart failure: Secondary | ICD-10-CM | POA: Diagnosis not present

## 2017-12-11 DIAGNOSIS — I482 Chronic atrial fibrillation: Secondary | ICD-10-CM | POA: Diagnosis not present

## 2017-12-11 DIAGNOSIS — Z7901 Long term (current) use of anticoagulants: Secondary | ICD-10-CM | POA: Diagnosis not present

## 2017-12-14 DIAGNOSIS — M6281 Muscle weakness (generalized): Secondary | ICD-10-CM | POA: Diagnosis not present

## 2017-12-14 DIAGNOSIS — Z7901 Long term (current) use of anticoagulants: Secondary | ICD-10-CM | POA: Diagnosis not present

## 2017-12-14 DIAGNOSIS — I110 Hypertensive heart disease with heart failure: Secondary | ICD-10-CM | POA: Diagnosis not present

## 2017-12-14 DIAGNOSIS — R2681 Unsteadiness on feet: Secondary | ICD-10-CM | POA: Diagnosis not present

## 2017-12-14 DIAGNOSIS — I5032 Chronic diastolic (congestive) heart failure: Secondary | ICD-10-CM | POA: Diagnosis not present

## 2017-12-14 DIAGNOSIS — I482 Chronic atrial fibrillation: Secondary | ICD-10-CM | POA: Diagnosis not present

## 2017-12-20 DIAGNOSIS — G8929 Other chronic pain: Secondary | ICD-10-CM | POA: Diagnosis not present

## 2017-12-20 DIAGNOSIS — I890 Lymphedema, not elsewhere classified: Secondary | ICD-10-CM | POA: Diagnosis not present

## 2017-12-20 DIAGNOSIS — I4891 Unspecified atrial fibrillation: Secondary | ICD-10-CM | POA: Diagnosis not present

## 2017-12-20 DIAGNOSIS — I10 Essential (primary) hypertension: Secondary | ICD-10-CM | POA: Diagnosis not present

## 2017-12-23 DIAGNOSIS — M6281 Muscle weakness (generalized): Secondary | ICD-10-CM | POA: Diagnosis not present

## 2017-12-23 DIAGNOSIS — I482 Chronic atrial fibrillation: Secondary | ICD-10-CM | POA: Diagnosis not present

## 2017-12-23 DIAGNOSIS — R2681 Unsteadiness on feet: Secondary | ICD-10-CM | POA: Diagnosis not present

## 2017-12-23 DIAGNOSIS — I110 Hypertensive heart disease with heart failure: Secondary | ICD-10-CM | POA: Diagnosis not present

## 2017-12-23 DIAGNOSIS — Z7901 Long term (current) use of anticoagulants: Secondary | ICD-10-CM | POA: Diagnosis not present

## 2017-12-23 DIAGNOSIS — I5032 Chronic diastolic (congestive) heart failure: Secondary | ICD-10-CM | POA: Diagnosis not present

## 2017-12-29 ENCOUNTER — Other Ambulatory Visit: Payer: Self-pay

## 2017-12-29 ENCOUNTER — Ambulatory Visit (INDEPENDENT_AMBULATORY_CARE_PROVIDER_SITE_OTHER): Payer: Medicare Other | Admitting: Vascular Surgery

## 2017-12-29 ENCOUNTER — Ambulatory Visit (HOSPITAL_COMMUNITY)
Admission: RE | Admit: 2017-12-29 | Discharge: 2017-12-29 | Disposition: A | Discharge status: Home / Self Care | Payer: Medicare Other | Source: Ambulatory Visit | Attending: Vascular Surgery | Admitting: Vascular Surgery

## 2017-12-29 ENCOUNTER — Encounter: Payer: Self-pay | Admitting: Vascular Surgery

## 2017-12-29 VITALS — BP 149/95 | HR 75 | Temp 98.2°F | Resp 18 | Ht 73.0 in | Wt 361.0 lb

## 2017-12-29 DIAGNOSIS — I872 Venous insufficiency (chronic) (peripheral): Secondary | ICD-10-CM

## 2017-12-29 DIAGNOSIS — M7989 Other specified soft tissue disorders: Secondary | ICD-10-CM | POA: Diagnosis not present

## 2017-12-29 NOTE — Progress Notes (Signed)
Patient ID: Patrick Bishop, male   DOB: 1941-07-07, 77 y.o.   MRN: [id number]  Reason for Consult: New Patient (Initial Visit) (venous stasis)   Referred by [name], MD  Subjective:     HPI:  Patrick Bishop is a 77 y.o. male with history of bilateral lower extremity swelling right greater than left.  He has never had ulceration but has had skin tears with drainage in the past.  He is tried most attempts with lymphedema pumps as well as compression therapies that are preventing him from having ulcers.  He is undergone bilateral common external iliac vein stenting that did not give him any relief.  He is also had ablation of the saphenous veins bilaterally all this was done in [place].  He is recently moved here.  His chief complaint is really pain in both of his feet as well as his hips.  He does not have any history of DVT that he recalls.  He does take Xarelto and aspirin.  He does not have any wounds at this time.  Past Medical History:  Diagnosis Date  . Chronic combined systolic and diastolic HF (heart failure) ([place])   . Chronic venous insufficiency 2015   s/p Bilateral Iliac & Femoral Stents (including IVC) - DEEP Venous Reflux.  Also s/p Bilateral GSV Ablation.  . Essential hypertension   . Hyperlipidemia with target LDL less than 100   . Longstanding persistent atrial fibrillation ([place])   . Obesity   . OSA on CPAP    May need New CPAP machine & CPAP MD)  . Pulmonary hypertension ([facility])    Family History  Problem Relation Age of Onset  . Cancer Mother   . Heart attack Father 43  . Hypertension Brother   . Cancer Sister   . Other Neg Hx        He really does not know much about his parents and siblings health, but they did not speak about healthcare   Past Surgical History:  Procedure Laterality Date  . ABDOMINAL VENOUS DUPLEX Bilateral 07/2014   ([place]: Dr. Cleda Mccreedy): (prior to Bilateral Iliac-Femoral Venous Stent Placement): Patent IVC.  No DVT B/L  Iliac or Com fem V..  +++ DEEP V REFLUX B/L ILIAC & FEM V.  Absent Superficial V Reflux B/L x L SSV (too tortuous for intervention).  BL GSV occluded - prior ablation.   . ABDOMINAL VENOUS DUPLEX Bilateral 11/2014   Absent DVT.  Left common femoral venous reflux: 1508 MS left common femoral vein, 2284MS left proximal femoral vein -> cystic structure noted in the left popliteal vein (7.6 x 1.4 x 3.8 cm).;  Right leg deep venous reflux noted in the distal iliac, common femoral, proximal profunda femoral, proximal superficial femoral, superficial femoral and popliteal vein.  [name] CARDIAC CATHETERIZATION  10/2009   [facility] - Dr. 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  EF~45%.  Mild pulmonary pretension.  [name] CARDIAC PET  08/2009   Inferolateral partially reversible defect -- > false positive by cath  . GREATER SAPHENOUS VEIN ABLATION Bilateral Before 2015   [facility] (Dr. Eugenie Filler)  . IR TRANSCATH PLC STENT  EA ADD VEIN  INC ANGIOPLASTY Left 10/2014   [facility], Dr. 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 IR TRANSCATH PLC STENT  INITIAL VEIN  INC ANGIOPLASTY Right 08/2014   [facility] - Dr. Stormy Fabian): US Guided --> R Common & External Iliac, Common Femoral Venography with Placement of 24 mmx 70 mm Stent - R Com Iliac-Ext Iliac- & Com Fem V. reducing 60-80% venous compression to ~0%).;; also noted significant L-sided venous compression (staged stenting).   [name] NM MYOVIEW LTD  11/2005   Adenosine Myoview [facility] - Dr. Cleda Mccreedy): Normal myocardial perfusion scan. No ischemia or infarction.  . TRANSTHORACIC ECHOCARDIOGRAM  04/26/2017    [facility] - Dr. Cleda Mccreedy) - report not available.  Per clinic note:  Normal global LV function. Mild concentric temperature. Mild TR. Mild pulmonary hypertension.;;   . TRANSTHORACIC ECHOCARDIOGRAM  7/'15; 7/'17   [facility] - Dr. Saverio Danker) a) low normal LV function (EF 56%). Moderate concentric LVH. Mild LA dilation. Normal pulmonary pressures.  ;; b) normal global function - EF 60-65%. Moderate pulmonary hypertension. Biatrial enlargement. Moderate concentric LVH.  [name] TRANSTHORACIC ECHOCARDIOGRAM  02/2010   [facility] - Dr. Saverio Danker): Normal global LV function. Mild LVH. EF 60%.; November 2010. Moderate LV dilation. Moderate LVH. Normal EF 50-55%. Mild to moderate pulmonary hypertension. (July 2012 showed normal function with mild LVH, mild MR noted in August 2014)    Short Social History:  Social History   Tobacco Use  . Smoking status: Former Smoker    Packs/day: 1.00    Years: 35.00    Pack years: 35.00    Types: Cigarettes    Last attempt to quit: 1996    Years since quitting: 23.2  . Smokeless tobacco: Never Used  . Tobacco comment: smoked from age [age]   Substance Use Topics  . Alcohol use: No    Frequency: Never    Comment: Not any more    Allergies  Allergen Reactions  . Aldactone [Spironolactone]     - unknown     Current Outpatient Medications  Medication Sig Dispense Refill  . amLODipine (NORVASC) 10 MG tablet Take 1 tablet (10 mg total) by mouth daily. 90 tablet 3  . aspirin 81 MG tablet Take 81 mg by mouth daily.    [name] atorvastatin (LIPITOR) 10 MG tablet Take 1 tablet (10 mg total) by mouth daily at 6 PM. 90 tablet 3  . furosemide (LASIX) 40 MG tablet Take 1 tablet in the AM, 1/2 tablet in the PM, may take an extra tablet if needed for swelling 155 tablet 3  . HYDROcodone-acetaminophen (NORCO/VICODIN) 5-325 MG tablet     . metoprolol tartrate (LOPRESSOR) 25 MG tablet Take 1 tablet (25 mg total) by mouth 2 (two) times daily. May take an extra  25 mg tablet as directed daily. 190 tablet 3  . XARELTO 20  MG TABS tablet Take 1 tablet (20 mg total) by mouth daily with supper. 90 tablet 3   No current facility-administered medications for this visit.     Review of Systems  Constitutional:  Constitutional negative. HENT: HENT negative.  Eyes: Eyes negative.  Respiratory: Respiratory negative.  Cardiovascular: Positive for irregular heartbeat and leg swelling.  GI: Gastrointestinal negative.  GU: Genitourinary negative. Musculoskeletal: Positive for back pain, gait problem and joint pain.  Skin:       Thickened ble skin with woody appearance Hematologic: Hematologic/lymphatic negative.  Psychiatric: Psychiatric negative.        Objective:  Objective   Vitals:   12/29/17  1222  BP: (!) 144/85  Pulse: 75  Resp: 18  Temp: 98.2 F (36.8 C)  TempSrc: Oral  SpO2: 98%  Weight: (!) 361 lb (163.7 kg)  Height: 6\' 1"  (1.854 m)   Body mass index is 47.63 kg/m.  Physical Exam  Constitutional: He is oriented to person, place, and time. He appears well-developed.  obese  HENT:  Head: Normocephalic.  Neck: Normal range of motion.  Cardiovascular: Normal rate.  Pulses:      Radial pulses are 2+ on the right side, and 2+ on the left side.  Pulmonary/Chest: Effort normal.  Abdominal: Soft.  Musculoskeletal: He exhibits edema.  ble non pitting edema, skin changes C4 venous disease Legs are soft and skin in tact  Neurological: He is alert and oriented to person, place, and time.  Skin:  Woody appearing ble  Psychiatric: He has a normal mood and affect. His behavior is normal. Judgment and thought content normal.    Data: I have independently interpreted his bilateral lower extremity venous reflux studies which demonstrate absent veins on the right common femoral vein reflux bilaterally.  He does have a discernible vein on the left side which is small at the level of the knee and does have reflux.     Assessment/Plan:     77 year old obese male with C4 venous disease that is  quite impressive on physical exam.  He has tried most therapies including lymphedema pumps wraps and venous interventions to no avail.  Thankfully he has never had an ulceration.  I discussed with him the goal moving forward to be preventing ulceration given that all of his veins have been treated no further venous treatments are merited.  I discussed with him that it would be impossible to follow his stents with duplex given his size but since he had no improvement following stenting of his common and external iliac veins I would not get a CT venogram or invasive venogram.  He does have compression wraps at home that he will use and elevate his legs when he is recumbent.  We have also discussed weight loss and walking as much as possible.  Thankfully right now he does not have any ulceration that merits wound care.  He can follow-up here on a as needed basis.    Waynetta Sandy MD [facility]

## 2018-01-01 DIAGNOSIS — I110 Hypertensive heart disease with heart failure: Secondary | ICD-10-CM | POA: Diagnosis not present

## 2018-01-01 DIAGNOSIS — I5032 Chronic diastolic (congestive) heart failure: Secondary | ICD-10-CM | POA: Diagnosis not present

## 2018-01-01 DIAGNOSIS — Z7901 Long term (current) use of anticoagulants: Secondary | ICD-10-CM | POA: Diagnosis not present

## 2018-01-01 DIAGNOSIS — I482 Chronic atrial fibrillation: Secondary | ICD-10-CM | POA: Diagnosis not present

## 2018-01-01 DIAGNOSIS — M6281 Muscle weakness (generalized): Secondary | ICD-10-CM | POA: Diagnosis not present

## 2018-01-01 DIAGNOSIS — R2681 Unsteadiness on feet: Secondary | ICD-10-CM | POA: Diagnosis not present

## 2018-01-02 DIAGNOSIS — I509 Heart failure, unspecified: Secondary | ICD-10-CM | POA: Diagnosis not present

## 2018-01-02 DIAGNOSIS — M170 Bilateral primary osteoarthritis of knee: Secondary | ICD-10-CM | POA: Diagnosis not present

## 2018-01-02 DIAGNOSIS — G4733 Obstructive sleep apnea (adult) (pediatric): Secondary | ICD-10-CM | POA: Diagnosis not present

## 2018-01-02 DIAGNOSIS — I10 Essential (primary) hypertension: Secondary | ICD-10-CM | POA: Diagnosis not present

## 2018-01-02 DIAGNOSIS — R222 Localized swelling, mass and lump, trunk: Secondary | ICD-10-CM | POA: Diagnosis not present

## 2018-01-02 DIAGNOSIS — I890 Lymphedema, not elsewhere classified: Secondary | ICD-10-CM | POA: Diagnosis not present

## 2018-01-02 DIAGNOSIS — E7800 Pure hypercholesterolemia, unspecified: Secondary | ICD-10-CM | POA: Diagnosis not present

## 2018-01-02 DIAGNOSIS — Z79899 Other long term (current) drug therapy: Secondary | ICD-10-CM | POA: Diagnosis not present

## 2018-01-31 DIAGNOSIS — G4733 Obstructive sleep apnea (adult) (pediatric): Secondary | ICD-10-CM | POA: Diagnosis not present

## 2018-02-13 DIAGNOSIS — I10 Essential (primary) hypertension: Secondary | ICD-10-CM | POA: Diagnosis not present

## 2018-02-13 DIAGNOSIS — I509 Heart failure, unspecified: Secondary | ICD-10-CM | POA: Diagnosis not present

## 2018-02-13 DIAGNOSIS — G894 Chronic pain syndrome: Secondary | ICD-10-CM | POA: Diagnosis not present

## 2018-02-13 DIAGNOSIS — M170 Bilateral primary osteoarthritis of knee: Secondary | ICD-10-CM | POA: Diagnosis not present

## 2018-02-20 DIAGNOSIS — G4733 Obstructive sleep apnea (adult) (pediatric): Secondary | ICD-10-CM | POA: Diagnosis not present

## 2018-05-17 DIAGNOSIS — H2522 Age-related cataract, morgagnian type, left eye: Secondary | ICD-10-CM | POA: Diagnosis not present

## 2018-05-17 DIAGNOSIS — H52201 Unspecified astigmatism, right eye: Secondary | ICD-10-CM | POA: Diagnosis not present

## 2018-05-25 ENCOUNTER — Telehealth: Payer: Self-pay | Admitting: *Deleted

## 2018-05-25 NOTE — Telephone Encounter (Signed)
   Primary Cardiologist: Glenetta Hew, MD  Chart reviewed as part of pre-operative protocol coverage.   Patient has a hx of mild non-obstructive CAD by prior Cardiac Catheterization, persistent atrial fibrillation, combined systolic and diastolic CHF, chronic venous insufficiency, obesity, hypertension, OSA.  According to our office protocol, no testing is needed for cataract surgery and he may proceed at acceptable risk.  ASA can be held if needed.  He has no history of PCI.  He is on long term anticoagulation with Xarelto due to atrial fibrillation.  CHADS2-VASc=4 (age x 2, HTN, CHF).    I will route to CV[facility] for recommendations regarding anticoagulation.   Richardson Dopp, PA-C 05/25/2018, 12:39 PM

## 2018-05-25 NOTE — Telephone Encounter (Signed)
   [facility] Pre-operative Risk Assessment    Request for surgical clearance:  1. What type of surgery is being performed? Cataract surgery   2. When is this surgery scheduled? 06/13/18   3. What type of clearance is required (medical clearance vs. Pharmacy clearance to hold med vs. Both)? both  4. Are there any medications that need to be held prior to surgery and how long? Xarelto, ASA   5. Practice name and name of physician performing surgery? [facility] Dr. Valetta Close   6. What is your office phone number [phone]   7.   What is your office fax number [phone]  8.   Anesthesia type (None, local, MAC, general) ? Not specified   Silverio Lay 05/25/2018, 10:43 AM  _________________________________________________________________   (provider comments below)

## 2018-05-25 NOTE — Telephone Encounter (Signed)
Pt ok to continue Xarelto for cataract surgery.

## 2018-05-29 NOTE — Telephone Encounter (Signed)
   I will route this recommendation to the requesting party via Epic fax function and remove from pre-op pool.  Please call with questions.  [place], [place] 05/29/2018, 2:57 PM

## 2018-06-05 ENCOUNTER — Telehealth: Payer: Self-pay

## 2018-06-05 NOTE — Telephone Encounter (Signed)
   [facility] Pre-operative Risk Assessment    Request for surgical clearance:  1. What type of surgery is being performed? Cataract Surgery  2. When is this surgery scheduled? 06/13/18   3. What type of clearance is required (medical clearance vs. Pharmacy clearance to hold med vs. Both)? Both  4. Are there any medications that need to be held prior to surgery and how long? Xarelto and Aspirin   5. Practice name and name of physician performing surgery? [facility]    6. What is your office phone number [phone]    7.   What is your office fax number [phone]  8.   Anesthesia type (None, local, MAC, general) ? Local   Ena Dawley 06/05/2018, 4:44 PM  _________________________________________________________________   (provider comments below)

## 2018-06-06 NOTE — Telephone Encounter (Signed)
We do not recommend holding Xarelto for cataract surgery

## 2018-06-06 NOTE — Telephone Encounter (Signed)
No cardiac clearance needed for cataract surgery- ? Any reason to hold Xarelto and Aspirin?  Kerin Ransom PA-C 06/06/2018 4:17 PM

## 2018-06-07 ENCOUNTER — Encounter: Payer: Self-pay | Admitting: *Deleted

## 2018-06-07 NOTE — Telephone Encounter (Signed)
This encounter was created in error - please disregard.

## 2018-06-07 NOTE — Telephone Encounter (Signed)
   Primary Cardiologist: Glenetta Hew, MD  Chart reviewed as part of pre-operative protocol coverage. Cataract extractions are recognized in guidelines as low risk surgeries that do not typically require specific preoperative testing or holding of blood thinner therapy. Therefore, given past medical history and time since last visit, based on ACC/AHA guidelines, Patrick Bishop would be at acceptable risk for the planned procedure without further cardiovascular testing.   I will route this recommendation to the requesting party via Epic fax function and remove from pre-op pool.  Please call with questions.  Rosaria Ferries, PA-C 06/07/2018, 2:21 PM

## 2018-06-07 NOTE — Telephone Encounter (Addendum)
Per Dr. Ellyn Hack, okay to hold aspirin for 5 days.  I will route this to the requesting physician and remove from the preop pool.  Rosaria Ferries, PA-C 06/07/2018 5:06 PM Beeper [phone]

## 2018-06-07 NOTE — Telephone Encounter (Signed)
Received call from [facility]  States they request patient to hold Xarelto for 7 days prior to cataract surgery due to the type of anesthesia (local w/ block).   They request ASA to be held as well if possible.     Advised this was not relayed in initial request, will have re addressed by pharmD to address Xarelto and ASA

## 2018-06-07 NOTE — Telephone Encounter (Signed)
Patient with diagnosis of atrial fibrillation  on Xarelto for anticoagulation.    Procedure: cataract extraction Date of procedure: 06/13/18  CHADS2-VASc score of  4 (CHF, HTN, AGE, , AGE, )  CrCl 122.6  (with IBW 59.8) Platelet count 188  Per office protocol, patient can hold Xarelto for 3 days prior to procedure.   (due to pharmacokinetics 3 days is all that is needed to reduce bleed risk in high risk surgical procedures)  Patient should restart Xarelto on the evening of procedure or day after, at discretion of procedure MD

## 2018-06-10 NOTE — Telephone Encounter (Signed)
Okay to hold both aspirin and Xarelto.  Glenetta Hew, MD

## 2018-06-13 DIAGNOSIS — H25812 Combined forms of age-related cataract, left eye: Secondary | ICD-10-CM | POA: Diagnosis not present

## 2018-06-13 DIAGNOSIS — H2522 Age-related cataract, morgagnian type, left eye: Secondary | ICD-10-CM | POA: Diagnosis not present

## 2018-06-13 DIAGNOSIS — H2512 Age-related nuclear cataract, left eye: Secondary | ICD-10-CM | POA: Diagnosis not present

## 2018-06-22 DIAGNOSIS — I890 Lymphedema, not elsewhere classified: Secondary | ICD-10-CM | POA: Diagnosis not present

## 2018-06-22 DIAGNOSIS — M170 Bilateral primary osteoarthritis of knee: Secondary | ICD-10-CM | POA: Diagnosis not present

## 2018-06-22 DIAGNOSIS — I10 Essential (primary) hypertension: Secondary | ICD-10-CM | POA: Diagnosis not present

## 2018-06-22 DIAGNOSIS — E7800 Pure hypercholesterolemia, unspecified: Secondary | ICD-10-CM | POA: Diagnosis not present

## 2018-06-22 DIAGNOSIS — Z23 Encounter for immunization: Secondary | ICD-10-CM | POA: Diagnosis not present

## 2018-06-22 DIAGNOSIS — I509 Heart failure, unspecified: Secondary | ICD-10-CM | POA: Diagnosis not present

## 2018-06-30 ENCOUNTER — Other Ambulatory Visit: Payer: Self-pay | Admitting: Cardiology

## 2018-07-02 ENCOUNTER — Ambulatory Visit (INDEPENDENT_AMBULATORY_CARE_PROVIDER_SITE_OTHER): Payer: Medicare Other | Admitting: Cardiology

## 2018-07-02 ENCOUNTER — Encounter: Payer: Self-pay | Admitting: Cardiology

## 2018-07-02 ENCOUNTER — Encounter

## 2018-07-02 VITALS — BP 120/67 | HR 60 | Ht 73.0 in | Wt 366.0 lb

## 2018-07-02 DIAGNOSIS — G4733 Obstructive sleep apnea (adult) (pediatric): Secondary | ICD-10-CM | POA: Diagnosis not present

## 2018-07-02 DIAGNOSIS — Z9989 Dependence on other enabling machines and devices: Secondary | ICD-10-CM | POA: Diagnosis not present

## 2018-07-02 DIAGNOSIS — E785 Hyperlipidemia, unspecified: Secondary | ICD-10-CM

## 2018-07-02 DIAGNOSIS — I4811 Longstanding persistent atrial fibrillation: Secondary | ICD-10-CM | POA: Diagnosis not present

## 2018-07-02 DIAGNOSIS — I5032 Chronic diastolic (congestive) heart failure: Secondary | ICD-10-CM

## 2018-07-02 DIAGNOSIS — I871 Compression of vein: Secondary | ICD-10-CM

## 2018-07-02 DIAGNOSIS — I272 Pulmonary hypertension, unspecified: Secondary | ICD-10-CM

## 2018-07-02 DIAGNOSIS — I872 Venous insufficiency (chronic) (peripheral): Secondary | ICD-10-CM

## 2018-07-02 DIAGNOSIS — I10 Essential (primary) hypertension: Secondary | ICD-10-CM

## 2018-07-02 MED ORDER — METOPROLOL TARTRATE 25 MG PO TABS: ORAL_TABLET | ORAL | 3 refills | Status: DC

## 2018-07-02 MED ORDER — FUROSEMIDE 40 MG PO TABS: ORAL_TABLET | ORAL | 3 refills | Status: DC

## 2018-07-02 NOTE — Patient Instructions (Addendum)
Medication Instructions:   STOP ASPRIN Continue metoprolol morning dose 25 mg Increase metoprolol evening dose to 37.5 mg ( 1 and 1/2 tablets)  Take 40 mg of lasix ( furosemide) twice a day for 2 days  Then starting next week take 40 mg  Twice a day (2days a week- suggest mondays and thursdays),the other days 20mg   Twice a day.   If you need a refill on your cardiac medications before your next appointment, please call your pharmacy.       Lab work: Not needed If you have labs (blood work) drawn today and your tests are completely normal, you will receive your results only by: [name] MyChart Message (if you have MyChart) OR . A paper copy in the mail If you have any lab test that is abnormal or we need to change your treatment, we will call you to review the results.   Testing/Procedures: Not needed    Follow-Up: At [facility] [place], you and your health needs are our priority.  As part of our continuing mission to provide you with exceptional heart care, we have created designated Provider Care Teams.  These Care Teams include your primary Cardiologist (physician) and Advanced Practice Providers (APPs -  Physician Assistants and Nurse Practitioners) who all work together to provide you with the care you need, when you need it. Your physician recommends that you schedule a follow-up appointment in [place], PA-C OR [name], DNP, ANP  You will need a follow up appointment in 6 months April 2020.   Please call our office 2 months in advance to schedule this appointment.  You may see Glenetta Hew, MD or one of the following Advanced Practice Providers on your designated Care Team:   Rosaria Ferries, PA-C . [name], DNP, ANP  Any Other Special Instructions Will Be Listed Below (If Applicable).

## 2018-07-02 NOTE — Progress Notes (Signed)
PCP: Lujean Amel, MD  Clinic Note: Chief Complaint  Patient presents with  . Follow-up    No new complaints  . Edema    Bilateral venous disease and chronic venous stasis.  Superficial and deep veins  . Atrial Fibrillation    HPI: Patrick Bishop is a 77 y.o. male with a PMH notable for Chronic Persistent Afib, obesity, OSA/H+OHS with HFpEF / Pulm HTN & Associated BL LE edema (with venoous stasis & multiple Venous sgx procedures) who presents today for 8 month.  Patrick Bishop was initially seen on November 14, 2017 at the request of Lujean Amel, MD -- to establish Cardiology Care.  I had a ton of information that I reviewed and updated in the past medical history.  Multiple clinic visits, annual echoes etc. -->  Noted no real symptoms from A. fib.  Rate relatively well controlled.  No bleeding on Xarelto.  No ulcerations of his legs.  4-5+ bilateral lower extremity right greater than left pitting edema.  This keeps him from walking much. --Needs new CPAP machine, and was not able to sleep without CPAP.  With his significant lower extremity venous history with venous stasis with saphenous vein ablation & bilateral common iliac vein stents etc., I referred him to Dr. Dayton Bailiff from vascular surgery. --Dr. Donzetta Matters note indicates that he has tried lymphedema pumps and compression therapies but he keeps having skin tears, but has never had ulcers. -->  Recommendation was to continue to try to prevent ulceration because there is not much more treatment to be done. -->  Unfortunately he is not a candidate for following his venous stents via Dopplers because of his size.  And there is actually no benefit from venous stenting. -->  Recommended compression wraps and foot elevation as well as trying to walk for weight loss.[name] He is not able to wear compression stockings or Unaboot because every time they are removed, he has skin peeling issues.  I also referred him to Dr. Claiborne Billings for OSA  follow-up  Recent Hospitalizations: None  Studies Personally Reviewed - (if available, images/films reviewed: From Epic Chart or Care Everywhere)  No new studies  Interval History: Red presents here today pretty much the same.  He was a little bit of bummed out by the fact that the vascular surgeon was not able to offer him anything.  He says he notes that his heart rate goes up more than usual at night lately.  It usually when he starts to lie down to go to sleep.  Despite the only time he mentions that he notices his A. fib. He has not taken his extra dose of Lasix any, but said that when I bumped it on the last time for couple days the swelling actually did improve some and it will improve with foot elevation.  Besides feeling his heart rate going up during sleep, he denies any chest pain with rest or exertion.  He is definitely short of breath when he does just about anything.  He is somewhat disabled walking with a walker almost because of his his weight and knee pain.  Stuck between a rock and a hard place between bad knee and weight making it hard for him to lose weight.  He has yet to see Dr. Claiborne Billings for his sleep apnea.  We did not discuss this issue but he seems to be doing okay with CPAP for now.  We will have our CPAP team contact him.  He cannot sleep lying  flat without CPAP.  He definitely gets orthopnea.  As long as he uses CPAP he does not get PND. Despite feeling rapid heart rates at nighttime, he does not notice it during the day unless he is very active. He denies any syncope/near syncope or TIA/amorous fugax.  No melena, hematochezia or hematuria.  No epistaxis.  No claudication.  ROS: A comprehensive was performed. Review of Systems  Constitutional: Positive for malaise/fatigue (Really just no energy). Negative for chills and fever.  HENT: Positive for hearing loss. Negative for nosebleeds.   Respiratory: Positive for shortness of breath (Baseline).   Gastrointestinal:  Negative for blood in stool and melena.  Genitourinary: Negative for hematuria.  Musculoskeletal: Positive for joint pain (Right knee more than anything else) and myalgias (Legs ache).  Skin: Positive for rash (Venous stasis changes with blisters but no ulcers.).       Easily his tears skin.  Neurological: Positive for weakness (Mostly because of deconditioning). Negative for focal weakness.       Poor balance  Endo/Heme/Allergies: Does not bruise/bleed easily.  Psychiatric/Behavioral: Negative for memory loss. The patient is not nervous/anxious and does not have insomnia.    I have reviewed and (if needed) personally updated the patient's problem list, medications, allergies, past medical and surgical history, social and family history.   Past Medical History:  Diagnosis Date  . Chronic diastolic heart failure (HCC)    Normal LV Fxn by Echo 04/2017   . Chronic venous insufficiency 2015   s/p Bilateral Iliac & Femoral Stents (including IVC) - DEEP Venous Reflux.  Also s/p Bilateral GSV Ablation.  . Essential hypertension   . Hyperlipidemia with target LDL less than 100   . Longstanding persistent atrial fibrillation   . Obesity   . OSA on CPAP    May need New CPAP machine & CPAP MD)  . Pulmonary hypertension ([place])     Past Surgical History:  Procedure Laterality Date  . ABDOMINAL VENOUS DUPLEX Bilateral 07/2014   ([place] Cardiology: Dr. Cleda Mccreedy): (prior to Bilateral Iliac-Femoral Venous Stent Placement): Patent IVC.  No DVT B/L Iliac or Com fem V..  +++ DEEP V REFLUX B/L ILIAC & FEM V.  Absent Superficial V Reflux B/L x L SSV (too tortuous for intervention).  BL GSV occluded - prior ablation.   . ABDOMINAL VENOUS DUPLEX Bilateral 11/2014   Absent DVT.  Left common femoral venous reflux: 1508 MS left common femoral vein, 2284MS left proximal femoral vein -> cystic structure noted in the left popliteal vein (7.6 x 1.4 x 3.8 cm).;  Right leg deep venous reflux noted in the distal iliac,  common femoral, proximal profunda femoral, proximal superficial femoral, superficial femoral and popliteal vein.  [name] CARDIAC CATHETERIZATION  10/2009   [facility] Card[facility] - Dr. 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  EF~45%.  Mild pulmonary pretension.  [name] CARDIAC PET  08/2009   Inferolateral partially reversible defect -- > false positive by cath  . GREATER SAPHENOUS VEIN ABLATION Bilateral Before 2015   [facility] (Dr. Eugenie Filler)  . IR TRANSCATH PLC STENT  EA ADD VEIN  INC ANGIOPLASTY Left 10/2014   [facility], Dr. 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 IR TRANSCATH  PLC STENT  INITIAL VEIN  INC ANGIOPLASTY Right 08/2014   [facility] - Dr. Stormy Fabian): US Guided --> R Common & External Iliac, Common Femoral Venography with Placement of 24 mmx 70 mm Stent - R Com Iliac-Ext Iliac- & Com Fem V. reducing 60-80% venous compression to ~0%).;; also noted significant L-sided venous compression (staged stenting).   [name] NM MYOVIEW LTD  11/2005   Adenosine Myoview [facility] - Dr. Cleda Mccreedy): Normal myocardial perfusion scan. No ischemia or infarction.  . TRANSTHORACIC ECHOCARDIOGRAM  04/26/2017    [facility]iology - Dr. Cleda Mccreedy) - report not available.  Per clinic note: Normal global LV function. Mild concentric temperature. Mild TR. Mild pulmonary hypertension.;;   . TRANSTHORACIC ECHOCARDIOGRAM  7/'15; 7/'17   [facility] - Dr. Saverio Danker) a) low normal LV function (EF 56%). Moderate concentric LVH. Mild LA dilation. Normal pulmonary pressures.  ;; b) normal global function - EF 60-65%. Moderate pulmonary hypertension. Biatrial enlargement. Moderate concentric LVH.  [name] TRANSTHORACIC ECHOCARDIOGRAM  02/2010   [facility]  Cardiology - Dr. Saverio Danker): Normal global LV function. Mild LVH. EF 60%.; November 2010. Moderate LV dilation. Moderate LVH. Normal EF 50-55%. Mild to moderate pulmonary hypertension. (July 2012 showed normal function with mild LVH, mild MR noted in August 2014)    Current Meds  Medication Sig  . amLODipine (NORVASC) 10 MG tablet Take 1 tablet (10 mg total) by mouth daily.  [name] atorvastatin (LIPITOR) 10 MG tablet Take 1 tablet (10 mg total) by mouth daily at 6 PM.  . HYDROcodone-acetaminophen (NORCO/VICODIN) 5-325 MG tablet   . XARELTO 20 MG TABS tablet Take 1 tablet (20 mg total) by mouth daily with supper.  . [DISCONTINUED] aspirin 81 MG tablet Take 81 mg by mouth daily.  . [DISCONTINUED] furosemide (LASIX) 40 MG tablet Take 1 tablet in the AM, 1/2 tablet in the PM, may take an extra tablet if needed for swelling  . [DISCONTINUED] metoprolol tartrate (LOPRESSOR) 25 MG tablet Take 1 tablet (25 mg total) by mouth 2 (two) times daily. May take an extra  25 mg tablet as directed daily.    Allergies  Allergen Reactions  . Aldactone [Spironolactone]     - unknown     Social History   Tobacco Use  . Smoking status: Former Smoker    Packs/day: 1.00    Years: 35.00    Pack years: 35.00    Types: Cigarettes    Last attempt to quit: 1996    Years since quitting: 23.7  . Smokeless tobacco: Never Used  . Tobacco comment: smoked from age [age]   Substance Use Topics  . Alcohol use: No    Frequency: Never    Comment: Not any more  . Drug use: No   Social History   Social History Narrative   Yvon recently moved to [place] in January 2019.  This was to be close to his daughter who is here with him today.  He has 2 daughters and one grandchild.      Jacqualine Code,  [place] --PCP was Sherlynn Carbon, MD.  Cardiologist: Saverio Danker, MD (who followed him every 3 months with routine visits.  He also had annual echocardiography done)   He is essentially physically disabled due to profound  bilateral venous stasis disease with essential lymphedema.   He currently lives at [place] [facility] (Dr. Albesa Seen) --however he hopes to be moving into his daughter's house once they have not established and ready for him.   Family History family  history includes Cancer in his mother and sister; Heart attack (age of onset: 17) in his father; Hypertension in his brother.  Not really but familiar with families medical history because no one talked about it.  Wt Readings from Last 3 Encounters:  07/02/18 (!) 366 lb (166 kg)  12/29/17 (!) 361 lb (163.7 kg)  11/14/17 (!) 368 lb (166.9 kg)    PHYSICAL EXAM BP 120/67   Pulse 60   Ht 6\' 1"  (1.854 m)   Wt (!) 366 lb (166 kg)   BMI 48.29 kg/m  Physical Exam  Constitutional: He is oriented to person, place, and time. He appears well-developed. No distress.  Super morbidly obese.  Uses walker.  Short of breath with minimal activity because of obesity and deconditioning.  Well-groomed  HENT:  Head: Normocephalic and atraumatic.  Neck: Normal range of motion and full passive range of motion without pain. Neck supple. No hepatojugular reflux and no JVD (Cannot really assess due to body habitus) present. Carotid bruit is not present.  Cardiovascular: Normal rate. An irregularly irregular rhythm present. PMI is not displaced (Cannot palpate). Exam reveals distant heart sounds and decreased pulses (Cannot palpate pulses bilaterally because of edema and obesity). Exam reveals no gallop and no friction rub.  No murmur heard. Unable to hear any bruit and femoral arteries.  Pulmonary/Chest: Effort normal and breath sounds normal. No respiratory distress. He has no wheezes. He has no rales.  Abdominal: Soft. Bowel sounds are normal. He exhibits no distension. There is no tenderness. There is no guarding.  Protuberant, obese.  Unable to assess HSM  Musculoskeletal: Normal range of motion. He exhibits edema (4-5+ bilateral (right>L) tense pitting edema.).    Limited mobility because of his weight and leg swelling.  Neurological: He is alert and oriented to person, place, and time.  Skin: Skin is warm. No rash noted. There is erythema (Reddish-brawny venous stasis changes bilateral legs.).  Several small little tears but lumpy almost intermittently blistering skin with weeping.  No obvious wounds or ulcers.  Psychiatric: He has a normal mood and affect. His behavior is normal. Judgment and thought content normal.  Somewhat slow speech.  Vitals reviewed.    Adult ECG Report  Rate: 60 ;  Rhythm: atrial fibrillation and Low voltage with nonspecific ST and T wave changes.;   Narrative Interpretation: Stable EKG  Other studies Reviewed: Additional studies/ records that were reviewed today include:  Recent Labs:  No results found for: CHOL, HDL, LDLCALC, LDLDIRECT, TRIG, CHOLHDL No results found for: CREATININE, BUN, NA, K, CL, CO2 From K PN dated January 02, 2018: TC 107, TG 64, LDL 62 and LDL 32.  Cr 1.05.  K+ 5, Hgb 13.2.    ASSESSMENT / PLAN: Problem List Items Addressed This Visit    Chronic diastolic HF (heart failure) ([place]) (Chronic)    This is not a very major issue for him.  Most of his symptoms are related to pulmonary hypertension and lower extremity edema.  That is probably more related to OSA, obesity/OHS.  He has pretty significant venous stasis with lower extremity vein stenting etc.  Plan for now is to continue his current dose of diuretic and using additional doses as needed.  I would increase his evening dose of metoprolol to 37.5mg  for little more blood pressure control but more for nighttime palpitations. He will continue to use CPAP.      Relevant Medications   furosemide (LASIX) 40 MG tablet   metoprolol tartrate (LOPRESSOR) 25  MG tablet   Chronic venous insufficiency (Chronic)    Pretty significant issue for him.  Unfortunately it seems that there is not much that can be done for him.  He has been through lymphedema pumps  etc.  The best thing we can do is probably use compression wraps which would probably mean using gauze on his skin and then with the wraps on top of the gauze.  This would help prevent the skin tears.  I think he needs to try. I also think we can tolerate going up on his Lasix a little bit more.  Otherwise, he needs to elevate his feet whenever possible.  He needs to walk more often as well.  Unfortunately no real good Doppler studies or venograms would help for assessment.      Relevant Medications   furosemide (LASIX) 40 MG tablet   metoprolol tartrate (LOPRESSOR) 25 MG tablet   Other Relevant Orders   EKG 12-Lead (Completed)   Essential hypertension (Chronic)    Blood pressure looks great.  He is on amlodipine which is less than favorable with edema, but at this point the edema is well beyond what amlodipine would do.      Relevant Medications   furosemide (LASIX) 40 MG tablet   metoprolol tartrate (LOPRESSOR) 25 MG tablet   Hyperlipidemia with target LDL less than 100 (Chronic)    LDL is well controlled on current dose of statin.  No change      Relevant Medications   furosemide (LASIX) 40 MG tablet   metoprolol tartrate (LOPRESSOR) 25 MG tablet   Longstanding persistent atrial fibrillation: CHA2DSVasc = ~3 (age x 2, HTN) - Primary (Chronic)    Long-standing asymptomatic A. fib with exception of having some tachycardia spells at night.  Rate is pretty well controlled with metoprolol, but I think if we increase the nighttime dose this may decrease the tachycardia at night.  No bleeding with Xarelto. Since he is on Xarelto I think we can safely stop aspirin I do not know how much benefit he can get from that besides PAD/PVD.      Relevant Medications   furosemide (LASIX) 40 MG tablet   metoprolol tartrate (LOPRESSOR) 25 MG tablet   Other Relevant Orders   EKG 12-Lead (Completed)   Morbid obesity (HCC) (Chronic)    Clearly a major feature for him with deconditioning and  obesity.  Talked about some dietary modification.  I really think that he is catheter out some way to build do some exercise with just water aerobics or some type of floor calisthenics.  He needs to work on losing weight.   May not be a great candidate for gastric bypass surgery given his age.      OSA on CPAP (Chronic)    Need to establish sleep medicine follow-up with Dr. Claiborne Billings      Pulmonary hypertension [facility]) (Chronic)    If no other reason.  This is probably the best reason for using amlodipine for hypertension as there is some benefit for pulmonary hypertension.  I do think this is related to his obesity and OHS.  Has had mild to moderate pulmonary pretension on all his echoes.  Continue CPAP, and see if we can get him into our CPAP clinic with Dr. Claiborne Billings.      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  (+/- concerns) n/a The following changes have been made:  see below   Patient Instructions  Medication Instructions:   STOP ASPRIN Continue metoprolol morning dose 25 mg Increase metoprolol evening dose to 37.5 mg ( 1 and 1/2 tablets)  Take 40 mg of lasix ( furosemide) twice a day for 2 days  Then starting next week take 40 mg  Twice a day (2days a week- suggest mondays and thursdays),the other days 20mg   Twice a day.   If you need a refill on your cardiac medications before your next appointment, please call your pharmacy.       Lab work: Not needed If you have labs (blood work) drawn today and your tests are completely normal, you will receive your results only by: [name] MyChart Message (if you have MyChart) OR . A  paper copy in the mail If you have any lab test that is abnormal or we need to change your treatment, we will call you to review the results.   Testing/Procedures: Not needed    Follow-Up: At [facility], you and your health needs are our priority.  As part of our continuing mission to provide you with exceptional heart care, we have created designated Provider Care Teams.  These Care Teams include your primary Cardiologist (physician) and Advanced Practice Providers (APPs -  Physician Assistants and Nurse Practitioners) who all work together to provide you with the care you need, when you need it. Your physician recommends that you schedule a follow-up appointment in [place], PA-C OR [name], DNP, ANP  You will need a follow up appointment in 6 months April 2020.   Please call our office 2 months in advance to schedule this appointment.  You may see Glenetta Hew, MD or one of the following Advanced Practice Providers on your designated Care Team:   Rosaria Ferries, PA-C . [name], DNP, ANP  Any Other Special Instructions Will Be Listed Below (If Applicable).      Studies Ordered:   Orders Placed This Encounter  Procedures  . EKG 12-Lead      Glenetta Hew, M.D., M.S. Interventional Cardiologist   Pager # [phone] Phone # [phone] [address] [place], [place] [address]   Thank you for choosing Heartcare at [facility]!!

## 2018-07-08 ENCOUNTER — Encounter: Payer: Self-pay | Admitting: Cardiology

## 2018-07-08 NOTE — Assessment & Plan Note (Signed)
Blood pressure looks great.  He is on amlodipine which is less than favorable with edema, but at this point the edema is well beyond what amlodipine would do.

## 2018-07-08 NOTE — Assessment & Plan Note (Signed)
Clearly a major feature for him with deconditioning and obesity.  Talked about some dietary modification.  I really think that he is catheter out some way to build do some exercise with just water aerobics or some type of floor calisthenics.  He needs to work on losing weight.   May not be a great candidate for gastric bypass surgery given his age.

## 2018-07-08 NOTE — Assessment & Plan Note (Signed)
Need to establish sleep medicine follow-up with Dr. Claiborne Billings

## 2018-07-08 NOTE — Assessment & Plan Note (Signed)
Long-standing asymptomatic A. fib with exception of having some tachycardia spells at night.  Rate is pretty well controlled with metoprolol, but I think if we increase the nighttime dose this may decrease the tachycardia at night.  No bleeding with Xarelto. Since he is on Xarelto I think we can safely stop aspirin I do not know how much benefit he can get from that besides PAD/PVD.

## 2018-07-08 NOTE — Assessment & Plan Note (Signed)
This is not a very major issue for him.  Most of his symptoms are related to pulmonary hypertension and lower extremity edema.  That is probably more related to OSA, obesity/OHS.  He has pretty significant venous stasis with lower extremity vein stenting etc.  Plan for now is to continue his current dose of diuretic and using additional doses as needed.  I would increase his evening dose of metoprolol to 37.5mg  for little more blood pressure control but more for nighttime palpitations. He will continue to use CPAP.

## 2018-07-08 NOTE — Assessment & Plan Note (Signed)
If no other reason.  This is probably the best reason for using amlodipine for hypertension as there is some benefit for pulmonary hypertension.  I do think this is related to his obesity and OHS.  Has had mild to moderate pulmonary pretension on all his echoes.  Continue CPAP, and see if we can get him into our CPAP clinic with Dr. Claiborne Billings.

## 2018-07-08 NOTE — Assessment & Plan Note (Signed)
Pretty significant issue for him.  Unfortunately it seems that there is not much that can be done for him.  He has been through lymphedema pumps etc.  The best thing we can do is probably use compression wraps which would probably mean using gauze on his skin and then with the wraps on top of the gauze.  This would help prevent the skin tears.  I think he needs to try. I also think we can tolerate going up on his Lasix a little bit more.  Otherwise, he needs to elevate his feet whenever possible.  He needs to walk more often as well.  Unfortunately no real good Doppler studies or venograms would help for assessment.

## 2018-07-08 NOTE — Assessment & Plan Note (Signed)
LDL is well controlled on current dose of statin.  No change

## 2018-07-09 NOTE — Progress Notes (Signed)
Called patient to give him appointment to see Dr Claiborne Billings tomorrow for CPAP evaluation. Patient states that he is not having any issues with his machine. I asked him what MD is managing his therapy and he states he does not have. One. I explained to the patient that he needs to have someone to manage this. He will need to have regular machine downloads as well as someone to sign for supplies. patient agrees however cannot come to see Dr Claiborne Billings tomorrow due to having other appointments scheduled. I told him I will check the schedule for next available and either a scheduler or myself will call him back. Patient agrees with plan.

## 2018-07-12 ENCOUNTER — Ambulatory Visit (INDEPENDENT_AMBULATORY_CARE_PROVIDER_SITE_OTHER): Payer: Medicare Other | Admitting: Podiatry

## 2018-07-12 ENCOUNTER — Ambulatory Visit (INDEPENDENT_AMBULATORY_CARE_PROVIDER_SITE_OTHER): Payer: Medicare Other

## 2018-07-12 ENCOUNTER — Encounter: Payer: Self-pay | Admitting: Podiatry

## 2018-07-12 VITALS — BP 138/73 | HR 78

## 2018-07-12 DIAGNOSIS — L97511 Non-pressure chronic ulcer of other part of right foot limited to breakdown of skin: Secondary | ICD-10-CM

## 2018-07-12 DIAGNOSIS — I4891 Unspecified atrial fibrillation: Secondary | ICD-10-CM | POA: Insufficient documentation

## 2018-07-12 DIAGNOSIS — S91119A Laceration without foreign body of unspecified toe without damage to nail, initial encounter: Secondary | ICD-10-CM | POA: Diagnosis not present

## 2018-07-12 DIAGNOSIS — M1990 Unspecified osteoarthritis, unspecified site: Secondary | ICD-10-CM | POA: Insufficient documentation

## 2018-07-12 DIAGNOSIS — G8929 Other chronic pain: Secondary | ICD-10-CM | POA: Insufficient documentation

## 2018-07-12 DIAGNOSIS — I509 Heart failure, unspecified: Secondary | ICD-10-CM | POA: Insufficient documentation

## 2018-07-12 DIAGNOSIS — I739 Peripheral vascular disease, unspecified: Secondary | ICD-10-CM | POA: Diagnosis not present

## 2018-07-12 DIAGNOSIS — R2681 Unsteadiness on feet: Secondary | ICD-10-CM | POA: Insufficient documentation

## 2018-07-12 DIAGNOSIS — I890 Lymphedema, not elsewhere classified: Secondary | ICD-10-CM

## 2018-07-12 DIAGNOSIS — H9190 Unspecified hearing loss, unspecified ear: Secondary | ICD-10-CM | POA: Insufficient documentation

## 2018-07-12 DIAGNOSIS — M6281 Muscle weakness (generalized): Secondary | ICD-10-CM | POA: Insufficient documentation

## 2018-07-12 DIAGNOSIS — T798XXA Other early complications of trauma, initial encounter: Secondary | ICD-10-CM | POA: Diagnosis not present

## 2018-07-13 ENCOUNTER — Telehealth: Payer: Self-pay | Admitting: *Deleted

## 2018-07-13 MED ORDER — MEDIHONEY WOUND/BURN DRESSING EX GEL: CUTANEOUS | 5 refills | Status: DC

## 2018-07-13 NOTE — Addendum Note (Signed)
Addended by: Harriett Sine D on: 07/13/2018 01:53 PM   Modules accepted: Orders

## 2018-07-13 NOTE — Telephone Encounter (Signed)
Dr. March Rummage ordered B/L unna boots to lower extremities 2 times week. Faxed to [facility].

## 2018-07-13 NOTE — Telephone Encounter (Signed)
I spoke with Fara Chute - [facility] and informed I needed to make an addendum to pt's orders faxed this morning. Kaitlin requested the orders be faxed. I faxed additional orders for medihoney to right great toe twice weekly.

## 2018-07-13 NOTE — Telephone Encounter (Signed)
Dr. March Rummage reminded me to order medihoney to right great toe wound to be changed twice weekly with the unna boot to both lower extremities.

## 2018-07-13 NOTE — Progress Notes (Signed)
Bilateral Unna boots both lower extremities 2x weekly Medihoney and Bandage TID to the right great toe

## 2018-07-14 DIAGNOSIS — Z7901 Long term (current) use of anticoagulants: Secondary | ICD-10-CM | POA: Diagnosis not present

## 2018-07-14 DIAGNOSIS — I872 Venous insufficiency (chronic) (peripheral): Secondary | ICD-10-CM | POA: Diagnosis not present

## 2018-07-14 DIAGNOSIS — E669 Obesity, unspecified: Secondary | ICD-10-CM | POA: Diagnosis not present

## 2018-07-14 DIAGNOSIS — I5032 Chronic diastolic (congestive) heart failure: Secondary | ICD-10-CM | POA: Diagnosis not present

## 2018-07-14 DIAGNOSIS — I110 Hypertensive heart disease with heart failure: Secondary | ICD-10-CM | POA: Diagnosis not present

## 2018-07-14 DIAGNOSIS — I890 Lymphedema, not elsewhere classified: Secondary | ICD-10-CM | POA: Diagnosis not present

## 2018-07-14 DIAGNOSIS — L97511 Non-pressure chronic ulcer of other part of right foot limited to breakdown of skin: Secondary | ICD-10-CM | POA: Diagnosis not present

## 2018-07-14 DIAGNOSIS — I4891 Unspecified atrial fibrillation: Secondary | ICD-10-CM | POA: Diagnosis not present

## 2018-07-20 DIAGNOSIS — L97511 Non-pressure chronic ulcer of other part of right foot limited to breakdown of skin: Secondary | ICD-10-CM | POA: Diagnosis not present

## 2018-07-20 DIAGNOSIS — I4891 Unspecified atrial fibrillation: Secondary | ICD-10-CM | POA: Diagnosis not present

## 2018-07-20 DIAGNOSIS — I5032 Chronic diastolic (congestive) heart failure: Secondary | ICD-10-CM | POA: Diagnosis not present

## 2018-07-20 DIAGNOSIS — I872 Venous insufficiency (chronic) (peripheral): Secondary | ICD-10-CM | POA: Diagnosis not present

## 2018-07-20 DIAGNOSIS — I890 Lymphedema, not elsewhere classified: Secondary | ICD-10-CM | POA: Diagnosis not present

## 2018-07-20 DIAGNOSIS — I110 Hypertensive heart disease with heart failure: Secondary | ICD-10-CM | POA: Diagnosis not present

## 2018-07-23 DIAGNOSIS — I110 Hypertensive heart disease with heart failure: Secondary | ICD-10-CM | POA: Diagnosis not present

## 2018-07-23 DIAGNOSIS — I890 Lymphedema, not elsewhere classified: Secondary | ICD-10-CM | POA: Diagnosis not present

## 2018-07-23 DIAGNOSIS — I872 Venous insufficiency (chronic) (peripheral): Secondary | ICD-10-CM | POA: Diagnosis not present

## 2018-07-23 DIAGNOSIS — I5032 Chronic diastolic (congestive) heart failure: Secondary | ICD-10-CM | POA: Diagnosis not present

## 2018-07-23 DIAGNOSIS — I4891 Unspecified atrial fibrillation: Secondary | ICD-10-CM | POA: Diagnosis not present

## 2018-07-23 DIAGNOSIS — L97511 Non-pressure chronic ulcer of other part of right foot limited to breakdown of skin: Secondary | ICD-10-CM | POA: Diagnosis not present

## 2018-07-24 ENCOUNTER — Telehealth: Payer: Self-pay | Admitting: Podiatry

## 2018-07-24 NOTE — Telephone Encounter (Signed)
This is [employer] [facility] [employer] at [facility]. We are treating Patrick Bishop and we need the diagnosis as to what caused the ulcer on pt's right great toe. For the unna boots, does he have circulation problems? I can be reached at [phone].

## 2018-07-24 NOTE — Telephone Encounter (Signed)
I reviewed Chart Review 12/29/2017 Dr. Donzetta Matters diagnosed pt with Venous Insuffiencey.

## 2018-07-26 DIAGNOSIS — I890 Lymphedema, not elsewhere classified: Secondary | ICD-10-CM | POA: Diagnosis not present

## 2018-07-26 DIAGNOSIS — I5032 Chronic diastolic (congestive) heart failure: Secondary | ICD-10-CM | POA: Diagnosis not present

## 2018-07-26 DIAGNOSIS — I4891 Unspecified atrial fibrillation: Secondary | ICD-10-CM | POA: Diagnosis not present

## 2018-07-26 DIAGNOSIS — I872 Venous insufficiency (chronic) (peripheral): Secondary | ICD-10-CM | POA: Diagnosis not present

## 2018-07-26 DIAGNOSIS — I110 Hypertensive heart disease with heart failure: Secondary | ICD-10-CM | POA: Diagnosis not present

## 2018-07-26 DIAGNOSIS — L97511 Non-pressure chronic ulcer of other part of right foot limited to breakdown of skin: Secondary | ICD-10-CM | POA: Diagnosis not present

## 2018-07-30 DIAGNOSIS — I5032 Chronic diastolic (congestive) heart failure: Secondary | ICD-10-CM | POA: Diagnosis not present

## 2018-07-30 DIAGNOSIS — I4891 Unspecified atrial fibrillation: Secondary | ICD-10-CM | POA: Diagnosis not present

## 2018-07-30 DIAGNOSIS — L97511 Non-pressure chronic ulcer of other part of right foot limited to breakdown of skin: Secondary | ICD-10-CM | POA: Diagnosis not present

## 2018-07-30 DIAGNOSIS — I110 Hypertensive heart disease with heart failure: Secondary | ICD-10-CM | POA: Diagnosis not present

## 2018-07-30 DIAGNOSIS — I890 Lymphedema, not elsewhere classified: Secondary | ICD-10-CM | POA: Diagnosis not present

## 2018-07-30 DIAGNOSIS — I872 Venous insufficiency (chronic) (peripheral): Secondary | ICD-10-CM | POA: Diagnosis not present

## 2018-08-02 DIAGNOSIS — I890 Lymphedema, not elsewhere classified: Secondary | ICD-10-CM | POA: Diagnosis not present

## 2018-08-02 DIAGNOSIS — I110 Hypertensive heart disease with heart failure: Secondary | ICD-10-CM | POA: Diagnosis not present

## 2018-08-02 DIAGNOSIS — I5032 Chronic diastolic (congestive) heart failure: Secondary | ICD-10-CM | POA: Diagnosis not present

## 2018-08-02 DIAGNOSIS — I4891 Unspecified atrial fibrillation: Secondary | ICD-10-CM | POA: Diagnosis not present

## 2018-08-02 DIAGNOSIS — I872 Venous insufficiency (chronic) (peripheral): Secondary | ICD-10-CM | POA: Diagnosis not present

## 2018-08-02 DIAGNOSIS — L97511 Non-pressure chronic ulcer of other part of right foot limited to breakdown of skin: Secondary | ICD-10-CM | POA: Diagnosis not present

## 2018-08-03 ENCOUNTER — Ambulatory Visit (INDEPENDENT_AMBULATORY_CARE_PROVIDER_SITE_OTHER): Payer: Medicare Other | Admitting: Podiatry

## 2018-08-03 DIAGNOSIS — L97511 Non-pressure chronic ulcer of other part of right foot limited to breakdown of skin: Secondary | ICD-10-CM

## 2018-08-03 DIAGNOSIS — I890 Lymphedema, not elsewhere classified: Secondary | ICD-10-CM

## 2018-08-07 DIAGNOSIS — I5032 Chronic diastolic (congestive) heart failure: Secondary | ICD-10-CM | POA: Diagnosis not present

## 2018-08-07 DIAGNOSIS — I4891 Unspecified atrial fibrillation: Secondary | ICD-10-CM | POA: Diagnosis not present

## 2018-08-07 DIAGNOSIS — L97511 Non-pressure chronic ulcer of other part of right foot limited to breakdown of skin: Secondary | ICD-10-CM | POA: Diagnosis not present

## 2018-08-07 DIAGNOSIS — I872 Venous insufficiency (chronic) (peripheral): Secondary | ICD-10-CM | POA: Diagnosis not present

## 2018-08-07 DIAGNOSIS — I890 Lymphedema, not elsewhere classified: Secondary | ICD-10-CM | POA: Diagnosis not present

## 2018-08-07 DIAGNOSIS — I110 Hypertensive heart disease with heart failure: Secondary | ICD-10-CM | POA: Diagnosis not present

## 2018-08-10 DIAGNOSIS — I872 Venous insufficiency (chronic) (peripheral): Secondary | ICD-10-CM | POA: Diagnosis not present

## 2018-08-10 DIAGNOSIS — L97511 Non-pressure chronic ulcer of other part of right foot limited to breakdown of skin: Secondary | ICD-10-CM | POA: Diagnosis not present

## 2018-08-10 DIAGNOSIS — I110 Hypertensive heart disease with heart failure: Secondary | ICD-10-CM | POA: Diagnosis not present

## 2018-08-10 DIAGNOSIS — I890 Lymphedema, not elsewhere classified: Secondary | ICD-10-CM | POA: Diagnosis not present

## 2018-08-10 DIAGNOSIS — I4891 Unspecified atrial fibrillation: Secondary | ICD-10-CM | POA: Diagnosis not present

## 2018-08-10 DIAGNOSIS — I5032 Chronic diastolic (congestive) heart failure: Secondary | ICD-10-CM | POA: Diagnosis not present

## 2018-08-13 DIAGNOSIS — I890 Lymphedema, not elsewhere classified: Secondary | ICD-10-CM | POA: Diagnosis not present

## 2018-08-13 DIAGNOSIS — I110 Hypertensive heart disease with heart failure: Secondary | ICD-10-CM | POA: Diagnosis not present

## 2018-08-13 DIAGNOSIS — I5032 Chronic diastolic (congestive) heart failure: Secondary | ICD-10-CM | POA: Diagnosis not present

## 2018-08-13 DIAGNOSIS — I872 Venous insufficiency (chronic) (peripheral): Secondary | ICD-10-CM | POA: Diagnosis not present

## 2018-08-13 DIAGNOSIS — I4891 Unspecified atrial fibrillation: Secondary | ICD-10-CM | POA: Diagnosis not present

## 2018-08-13 DIAGNOSIS — L97511 Non-pressure chronic ulcer of other part of right foot limited to breakdown of skin: Secondary | ICD-10-CM | POA: Diagnosis not present

## 2018-08-16 DIAGNOSIS — L97511 Non-pressure chronic ulcer of other part of right foot limited to breakdown of skin: Secondary | ICD-10-CM | POA: Diagnosis not present

## 2018-08-16 DIAGNOSIS — I4891 Unspecified atrial fibrillation: Secondary | ICD-10-CM | POA: Diagnosis not present

## 2018-08-16 DIAGNOSIS — I5032 Chronic diastolic (congestive) heart failure: Secondary | ICD-10-CM | POA: Diagnosis not present

## 2018-08-16 DIAGNOSIS — I890 Lymphedema, not elsewhere classified: Secondary | ICD-10-CM | POA: Diagnosis not present

## 2018-08-16 DIAGNOSIS — I872 Venous insufficiency (chronic) (peripheral): Secondary | ICD-10-CM | POA: Diagnosis not present

## 2018-08-16 DIAGNOSIS — I110 Hypertensive heart disease with heart failure: Secondary | ICD-10-CM | POA: Diagnosis not present

## 2018-08-17 ENCOUNTER — Telehealth: Payer: Self-pay | Admitting: Podiatry

## 2018-08-17 NOTE — Telephone Encounter (Signed)
[name] from [facility] called requesting updated Wound care orders for patient. They have been caring for the wound but stated that the area seems to have scabbed over and they are curious if they should continue with unna boot or leave the area open for air to get to it. Please give them a call back.

## 2018-08-31 ENCOUNTER — Ambulatory Visit: Payer: Medicare Other | Admitting: Podiatry

## 2018-09-01 DIAGNOSIS — I110 Hypertensive heart disease with heart failure: Secondary | ICD-10-CM | POA: Diagnosis not present

## 2018-09-01 DIAGNOSIS — I890 Lymphedema, not elsewhere classified: Secondary | ICD-10-CM | POA: Diagnosis not present

## 2018-09-01 DIAGNOSIS — I4891 Unspecified atrial fibrillation: Secondary | ICD-10-CM | POA: Diagnosis not present

## 2018-09-01 DIAGNOSIS — I872 Venous insufficiency (chronic) (peripheral): Secondary | ICD-10-CM | POA: Diagnosis not present

## 2018-09-01 DIAGNOSIS — I5032 Chronic diastolic (congestive) heart failure: Secondary | ICD-10-CM | POA: Diagnosis not present

## 2018-09-01 DIAGNOSIS — L97511 Non-pressure chronic ulcer of other part of right foot limited to breakdown of skin: Secondary | ICD-10-CM | POA: Diagnosis not present

## 2018-09-06 ENCOUNTER — Ambulatory Visit (INDEPENDENT_AMBULATORY_CARE_PROVIDER_SITE_OTHER): Payer: Medicare Other | Admitting: Podiatry

## 2018-09-06 DIAGNOSIS — I890 Lymphedema, not elsewhere classified: Secondary | ICD-10-CM

## 2018-09-06 DIAGNOSIS — L97511 Non-pressure chronic ulcer of other part of right foot limited to breakdown of skin: Secondary | ICD-10-CM

## 2018-10-01 ENCOUNTER — Telehealth: Payer: Self-pay | Admitting: Podiatry

## 2018-10-01 NOTE — Telephone Encounter (Signed)
error 

## 2018-10-02 DIAGNOSIS — L97921 Non-pressure chronic ulcer of unspecified part of left lower leg limited to breakdown of skin: Secondary | ICD-10-CM | POA: Diagnosis not present

## 2018-10-02 DIAGNOSIS — I509 Heart failure, unspecified: Secondary | ICD-10-CM | POA: Diagnosis not present

## 2018-10-02 DIAGNOSIS — Z6841 Body Mass Index (BMI) 40.0 and over, adult: Secondary | ICD-10-CM | POA: Diagnosis not present

## 2018-10-02 DIAGNOSIS — I739 Peripheral vascular disease, unspecified: Secondary | ICD-10-CM | POA: Diagnosis not present

## 2018-10-02 DIAGNOSIS — I10 Essential (primary) hypertension: Secondary | ICD-10-CM | POA: Diagnosis not present

## 2018-10-02 DIAGNOSIS — I890 Lymphedema, not elsewhere classified: Secondary | ICD-10-CM | POA: Diagnosis not present

## 2018-10-10 DIAGNOSIS — Z6835 Body mass index (BMI) 35.0-35.9, adult: Secondary | ICD-10-CM | POA: Diagnosis not present

## 2018-10-10 DIAGNOSIS — I890 Lymphedema, not elsewhere classified: Secondary | ICD-10-CM | POA: Diagnosis not present

## 2018-10-10 DIAGNOSIS — Z7901 Long term (current) use of anticoagulants: Secondary | ICD-10-CM | POA: Diagnosis not present

## 2018-10-10 DIAGNOSIS — L97211 Non-pressure chronic ulcer of right calf limited to breakdown of skin: Secondary | ICD-10-CM | POA: Diagnosis not present

## 2018-10-10 DIAGNOSIS — L97221 Non-pressure chronic ulcer of left calf limited to breakdown of skin: Secondary | ICD-10-CM | POA: Diagnosis not present

## 2018-10-10 DIAGNOSIS — Z87891 Personal history of nicotine dependence: Secondary | ICD-10-CM | POA: Diagnosis not present

## 2018-10-11 DIAGNOSIS — Z6835 Body mass index (BMI) 35.0-35.9, adult: Secondary | ICD-10-CM | POA: Diagnosis not present

## 2018-10-11 DIAGNOSIS — Z7901 Long term (current) use of anticoagulants: Secondary | ICD-10-CM | POA: Diagnosis not present

## 2018-10-11 DIAGNOSIS — I890 Lymphedema, not elsewhere classified: Secondary | ICD-10-CM | POA: Diagnosis not present

## 2018-10-11 DIAGNOSIS — L97221 Non-pressure chronic ulcer of left calf limited to breakdown of skin: Secondary | ICD-10-CM | POA: Diagnosis not present

## 2018-10-11 DIAGNOSIS — Z87891 Personal history of nicotine dependence: Secondary | ICD-10-CM | POA: Diagnosis not present

## 2018-10-11 DIAGNOSIS — L97211 Non-pressure chronic ulcer of right calf limited to breakdown of skin: Secondary | ICD-10-CM | POA: Diagnosis not present

## 2018-10-12 ENCOUNTER — Telehealth: Payer: Self-pay | Admitting: Podiatry

## 2018-10-12 NOTE — Telephone Encounter (Signed)
Pt stated that at his last visit he was suppose to open a pump but now he has open wounds on his legs

## 2018-10-12 NOTE — Telephone Encounter (Signed)
Pt states the pumps for his legs have caused wounds on his legs and he does not want to order them at this time. Pt states he is being seen by another doctor for the wounds on his legs and would like to cancel his appt with Dr. March Rummage next week.

## 2018-10-17 DIAGNOSIS — Z7901 Long term (current) use of anticoagulants: Secondary | ICD-10-CM | POA: Diagnosis not present

## 2018-10-17 DIAGNOSIS — I890 Lymphedema, not elsewhere classified: Secondary | ICD-10-CM | POA: Diagnosis not present

## 2018-10-17 DIAGNOSIS — L97221 Non-pressure chronic ulcer of left calf limited to breakdown of skin: Secondary | ICD-10-CM | POA: Diagnosis not present

## 2018-10-17 DIAGNOSIS — Z6835 Body mass index (BMI) 35.0-35.9, adult: Secondary | ICD-10-CM | POA: Diagnosis not present

## 2018-10-17 DIAGNOSIS — Z87891 Personal history of nicotine dependence: Secondary | ICD-10-CM | POA: Diagnosis not present

## 2018-10-17 DIAGNOSIS — L97211 Non-pressure chronic ulcer of right calf limited to breakdown of skin: Secondary | ICD-10-CM | POA: Diagnosis not present

## 2018-10-18 ENCOUNTER — Ambulatory Visit: Payer: Medicare Other | Admitting: Podiatry

## 2018-10-23 DIAGNOSIS — Z6835 Body mass index (BMI) 35.0-35.9, adult: Secondary | ICD-10-CM | POA: Diagnosis not present

## 2018-10-23 DIAGNOSIS — Z87891 Personal history of nicotine dependence: Secondary | ICD-10-CM | POA: Diagnosis not present

## 2018-10-23 DIAGNOSIS — I890 Lymphedema, not elsewhere classified: Secondary | ICD-10-CM | POA: Diagnosis not present

## 2018-10-23 DIAGNOSIS — L97221 Non-pressure chronic ulcer of left calf limited to breakdown of skin: Secondary | ICD-10-CM | POA: Diagnosis not present

## 2018-10-23 DIAGNOSIS — Z7901 Long term (current) use of anticoagulants: Secondary | ICD-10-CM | POA: Diagnosis not present

## 2018-10-23 DIAGNOSIS — L97211 Non-pressure chronic ulcer of right calf limited to breakdown of skin: Secondary | ICD-10-CM | POA: Diagnosis not present

## 2018-10-29 DIAGNOSIS — L97211 Non-pressure chronic ulcer of right calf limited to breakdown of skin: Secondary | ICD-10-CM | POA: Diagnosis not present

## 2018-10-29 DIAGNOSIS — L97221 Non-pressure chronic ulcer of left calf limited to breakdown of skin: Secondary | ICD-10-CM | POA: Diagnosis not present

## 2018-10-29 DIAGNOSIS — I890 Lymphedema, not elsewhere classified: Secondary | ICD-10-CM | POA: Diagnosis not present

## 2018-10-29 DIAGNOSIS — Z7901 Long term (current) use of anticoagulants: Secondary | ICD-10-CM | POA: Diagnosis not present

## 2018-10-29 DIAGNOSIS — Z6835 Body mass index (BMI) 35.0-35.9, adult: Secondary | ICD-10-CM | POA: Diagnosis not present

## 2018-10-29 DIAGNOSIS — Z87891 Personal history of nicotine dependence: Secondary | ICD-10-CM | POA: Diagnosis not present

## 2018-11-05 DIAGNOSIS — Z7901 Long term (current) use of anticoagulants: Secondary | ICD-10-CM | POA: Diagnosis not present

## 2018-11-05 DIAGNOSIS — Z6835 Body mass index (BMI) 35.0-35.9, adult: Secondary | ICD-10-CM | POA: Diagnosis not present

## 2018-11-05 DIAGNOSIS — L97211 Non-pressure chronic ulcer of right calf limited to breakdown of skin: Secondary | ICD-10-CM | POA: Diagnosis not present

## 2018-11-05 DIAGNOSIS — L97221 Non-pressure chronic ulcer of left calf limited to breakdown of skin: Secondary | ICD-10-CM | POA: Diagnosis not present

## 2018-11-05 DIAGNOSIS — Z87891 Personal history of nicotine dependence: Secondary | ICD-10-CM | POA: Diagnosis not present

## 2018-11-05 DIAGNOSIS — I890 Lymphedema, not elsewhere classified: Secondary | ICD-10-CM | POA: Diagnosis not present

## 2018-11-08 ENCOUNTER — Other Ambulatory Visit: Payer: Self-pay

## 2018-11-08 ENCOUNTER — Ambulatory Visit (INDEPENDENT_AMBULATORY_CARE_PROVIDER_SITE_OTHER): Payer: Medicare Other | Admitting: Cardiovascular Disease

## 2018-11-08 ENCOUNTER — Encounter: Payer: Self-pay | Admitting: Cardiovascular Disease

## 2018-11-08 VITALS — BP 140/82 | HR 74 | Ht 73.0 in | Wt 370.0 lb

## 2018-11-08 DIAGNOSIS — I872 Venous insufficiency (chronic) (peripheral): Secondary | ICD-10-CM

## 2018-11-08 DIAGNOSIS — G4733 Obstructive sleep apnea (adult) (pediatric): Secondary | ICD-10-CM | POA: Diagnosis not present

## 2018-11-08 DIAGNOSIS — I10 Essential (primary) hypertension: Secondary | ICD-10-CM

## 2018-11-08 DIAGNOSIS — I272 Pulmonary hypertension, unspecified: Secondary | ICD-10-CM

## 2018-11-08 DIAGNOSIS — I5032 Chronic diastolic (congestive) heart failure: Secondary | ICD-10-CM

## 2018-11-08 DIAGNOSIS — I4811 Longstanding persistent atrial fibrillation: Secondary | ICD-10-CM

## 2018-11-08 DIAGNOSIS — Z7901 Long term (current) use of anticoagulants: Secondary | ICD-10-CM | POA: Diagnosis not present

## 2018-11-08 NOTE — Progress Notes (Signed)
Cardiology Office Note    Date:  11/10/2018   ID:  Patrick [place], DOB 1940-11-10, MRN [id number]  PCP:  Lujean Amel, MD  Cardiologist:  Shelva Majestic, MD (sleep); Dr. Ellyn Hack   New sleep evaluation  History of Present Illness:  Patrick Bishop is a 78 y.o. male who has a longstanding history of obstructive sleep apnea originally diagnosed in 2006 in [place].  There is reported history of severe sleep apnea on his initial study and he had been on CPAP therapy ever since..  He had recently undergone a home study in May 2019 prior to obtaining a new ResMed air sense 10 AutoSet CPAP unit.  On his home study, severe sleep apnea and his respiratory event index was 53/h.  He had severe oxygen desaturation to a nadir of 69% and time below 89% was 98 minutes or 24% of his monitoring time.  His home study was interpreted by Dr. Elenore Rota.  The patient received his new ResMed CPAP unit on March 02, 2018.  Apparently since that time he was set on an AutoSet with a minimum pressure of 4 and maximum pressure of 16.  He had not had a follow-up sleep evaluation.  A download was obtained in the office today from January 13 through November 06, 2018 shows 100% of usage days.  He is averaging 5 hours and 53 minutes of CPAP use.  Typically he states he goes to bed between 11 PM and midnight and often wakes up around 7.  He has been using a nasal mask.  I on his download is increased to 10.7 with an apnea index of 6 and hypopnea index of 4.7.  His 95th percentile pressure is 11.6 with a maximum average pressure 12.8.  He isI aware of significant improvement with his new machine.  He denies any breakthrough snoring. He has nocturia several times per night which is improved from he is 4 times per night.  His past history is notable for chronic atrial fibrillation, significant lower extremity edema with venous insufficiency, venous stasis with saphenous vein ablation of bilateral common iliac vein stents.  He  had previously tried lymphedema pumps and compression therapies.  He has noticed that his heart rate seems to increase during sleep.  He had recently seen Dr. Ellyn Hack and his beta-blocker was increased.  He now presents for initial sleep evaluation with me.   Past Medical History:  Diagnosis Date  . Chronic diastolic heart failure (HCC)    Normal LV Fxn by Echo 04/2017   . Chronic venous insufficiency 2015   s/p Bilateral Iliac & Femoral Stents (including IVC) - DEEP Venous Reflux.  Also s/p Bilateral GSV Ablation.  . Essential hypertension   . Hyperlipidemia with target LDL less than 100   . Longstanding persistent atrial fibrillation   . Obesity   . OSA on CPAP    May need New CPAP machine & CPAP MD)  . Pulmonary hypertension ([place])     Past Surgical History:  Procedure Laterality Date  . ABDOMINAL VENOUS DUPLEX Bilateral 07/2014   ([facility]: Dr. Cleda Mccreedy): (prior to Bilateral Iliac-Femoral Venous Stent Placement): Patent IVC.  No DVT B/L Iliac or Com fem V..  +++ DEEP V REFLUX B/L ILIAC & FEM V.  Absent Superficial V Reflux B/L x L SSV (too tortuous for intervention).  BL GSV occluded - prior ablation.   . ABDOMINAL VENOUS DUPLEX Bilateral 11/2014   Absent DVT.  Left common femoral venous reflux: 1508 MS left  common femoral vein, 2284MS left proximal femoral vein -> cystic structure noted in the left popliteal vein (7.6 x 1.4 x 3.8 cm).;  Right leg deep venous reflux noted in the distal iliac, common femoral, proximal profunda femoral, proximal superficial femoral, superficial femoral and popliteal vein.  [name] CARDIAC CATHETERIZATION  10/2009   [facility] - Dr. 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  EF~45%.  Mild pulmonary pretension.  [name] CARDIAC PET  08/2009   Inferolateral partially reversible defect -- > false positive by cath  . GREATER SAPHENOUS VEIN ABLATION Bilateral Before 2015   [facility] (Dr.  Eugenie Filler)  . IR TRANSCATH PLC STENT  EA ADD VEIN  INC ANGIOPLASTY Left 10/2014   [facility], Dr. 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 IR TRANSCATH PLC STENT  INITIAL VEIN  INC ANGIOPLASTY Right 08/2014   [facility] - Dr. Stormy Fabian): US Guided --> R Common & External Iliac, Common Femoral Venography with Placement of 24 mmx 70 mm Stent - R Com Iliac-Ext Iliac- & Com Fem V. reducing 60-80% venous compression to ~0%).;; also noted significant L-sided venous compression (staged stenting).   [name] NM MYOVIEW LTD  11/2005   Adenosine Myoview [facility] - Dr. Cleda Mccreedy): Normal myocardial perfusion scan. No ischemia or infarction.  . TRANSTHORACIC ECHOCARDIOGRAM  04/26/2017    [facility] - Dr. Cleda Mccreedy) - report not available.  Per clinic note: Normal global LV function. Mild concentric temperature. Mild TR. Mild pulmonary hypertension.;;   . TRANSTHORACIC ECHOCARDIOGRAM  7/'15; 7/'17   [facility] - Dr. Saverio Danker) a) low normal LV function (EF 56%). Moderate concentric LVH. Mild LA dilation. Normal pulmonary pressures.  ;; b) normal global function - EF 60-65%. Moderate pulmonary hypertension. Biatrial enlargement. Moderate concentric LVH.  [name] TRANSTHORACIC ECHOCARDIOGRAM  02/2010   [facility] - Dr. Saverio Danker): Normal global LV function. Mild LVH. EF 60%.; November 2010. Moderate LV dilation. Moderate LVH. Normal EF 50-55%. Mild to moderate pulmonary hypertension. (July 2012 showed normal function with mild LVH, mild MR noted in August 2014)    Current Medications: Outpatient Medications Prior to Visit  Medication Sig Dispense Refill  . amLODipine (NORVASC) 10 MG tablet Take 1 tablet (10 mg total) by mouth daily. 90 tablet 3  . amoxicillin-clavulanate  (AUGMENTIN) 875-125 MG tablet     . atorvastatin (LIPITOR) 10 MG tablet Take 1 tablet (10 mg total) by mouth daily at 6 PM. 90 tablet 3  . furosemide (LASIX) 40 MG tablet TAKE 1 TABLET IN THE MORNING AND TAKE ONE-HALF (1/2) TABLET IN THE EVENING. MAY TAKE AN EXTRA TABLET IF NEEDED FOR SWELLING 155 tablet 5  . furosemide (LASIX) 40 MG tablet Take  20 mg  ( 1/2 tablet) twice a day except on Monday and Thursday take 40 mg twice a day. 180 tablet 3  . HYDROcodone-acetaminophen (NORCO/VICODIN) 5-325 MG tablet     . moxifloxacin (VIGAMOX) 0.5 % ophthalmic solution     . neomycin-polymyxin b-dexamethasone (MAXITROL) 3.5-10000-0.1 OINT     . oxybutynin (DITROPAN-XL) 10 MG 24 hr tablet oxybutynin chloride ER 10 mg tablet,extended release 24 hr    . prednisoLONE acetate (PRED FORTE) 1 % ophthalmic suspension     .  Wound Dressings (MEDIHONEY WOUND/BURN DRESSING) GEL Apply to affected are 2 times a week, and cover with sterile dressing. 1 Tube 5  . XARELTO 20 MG TABS tablet Take 1 tablet (20 mg total) by mouth daily with supper. 90 tablet 3  . metoprolol tartrate (LOPRESSOR) 25 MG tablet Take 1 tablet (25 mg total) by mouth every morning AND 1.5 tablets (37.5 mg total) every evening. 180 tablet 3   No facility-administered medications prior to visit.      Allergies:   Aldactone [spironolactone]   Social History   Socioeconomic History  . Marital status: Widowed    Spouse name: Not on file  . Number of children: 2  . Years of education: Not on file  . Highest education level: Some college, no degree  Occupational History  . 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 Occupation: Lobbyist: [employer]    Comment: followed by several odd jobs  . 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 Financial resource strain: Not on file  . Food insecurity:    Worry: Not on file    Inability: Not on file  . Transportation needs:     Medical: Not on file    Non-medical: Not on file  Tobacco Use  . Smoking status: Former Smoker    Packs/day: 1.00    Years: 35.00    Pack years: 35.00    Types: Cigarettes    Last attempt to quit: 1996    Years since quitting: 24.1  . Smokeless tobacco: Never Used  . Tobacco comment: smoked from age [age]   Substance and Sexual Activity  . Alcohol use: No    Frequency: Never    Comment: Not any more  . Drug use: No  . Sexual activity: Not Currently  Lifestyle  . Physical activity:    Days per week: Not on file    Minutes per session: Not on file  . Stress: Not on file  Relationships  . Social connections:    Talks on phone: Not on file    Gets together: Not on file    Attends religious service: Not on file    Active member of club or organization: Not on file    Attends meetings of clubs or organizations: Not on file    Relationship status: Not on file  Other Topics Concern  . Not on file  Social History Narrative   [place] recently moved to [place] in January 2019.  This was to be close to his daughter who is here with him today.  He has 2 daughters and one grandchild.      [place],  [place] --PCP was Sherlynn Carbon, MD.  Cardiologist: Saverio Danker, MD (who followed him every 3 months with routine visits.  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   He currently lives at [place] (Dr. Albesa Seen) --however he hopes to be moving into his daughter's house once they have not established and ready for him.    Additional social history is notable that he was born in [place].  He is widowed.  He is retired and spent 24 years in [employer] and was on [place].  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  Family History:  The patient's family history includes Cancer in  his mother and sister; Heart attack (age of onset: 34) in his father;  Hypertension in his brother.  His father died of an MI.  His mother died with colon cancer.  He has 1 brother and 3 sisters, one sister is deceased with colon cancer.  ROS General: Negative; No fevers, chills, or night sweats;  HEENT: Negative; No changes in vision or hearing, sinus congestion, difficulty swallowing Pulmonary: Negative; No cough, wheezing, shortness of breath, hemoptysis Cardiovascular: Negative; No chest pain, presyncope, syncope, palpitations GI: Negative; No nausea, vomiting, diarrhea, or abdominal pain GU: Negative; No dysuria, hematuria, or difficulty voiding Musculoskeletal: Negative; no myalgias, joint pain, or weakness Hematologic/Oncology: Negative; no easy bruising, bleeding Endocrine: Negative; no heat/cold intolerance; no diabetes Neuro: Negative; no changes in balance, headaches Skin: Negative; No rashes or skin lesions Psychiatric: Negative; No behavioral problems, depression Sleep: OSA documented since 2006 in [place]; received a new CPAP machine March 02, 2018.  He is unaware of breakthrough snoring.no daytime sleepiness, hypersomnolence, bruxism, restless legs, hypnogognic hallucinations, no cataplexy  In the office today I calculated Epworth score which endorsed at 5 as shown below:   Epworth Sleepiness Scale: Situation   Chance of Dozing/Sleeping (0 = never , 1 = slight chance , 2 = moderate chance , 3 = high chance )   sitting and reading 0   watching TV 1   sitting inactive in a public place 0   being a passenger in a motor vehicle for an hour or more 0   lying down in the afternoon 3   sitting and talking to someone 0   sitting quietly after lunch (no alcohol) 0   while stopped for a few minutes in traffic as the driver 0   Total Score  5    Other comprehensive 14 point system review is negative.   PHYSICAL EXAM:   VS:  BP 140/82   Pulse 74   Ht 6\' 1"  (1.854 m)   Wt (!) 370 lb (167.8 kg)   BMI 48.82 kg/m     Repeat blood pressure by me  was 124/68  Wt Readings from Last 3 Encounters:  11/08/18 (!) 370 lb (167.8 kg)  07/02/18 (!) 366 lb (166 kg)  12/29/17 (!) 361 lb (163.7 kg)    General: Alert, oriented, no distress.  Morbidly obese Skin: normal turgor, no rashes, warm and dry HEENT: Normocephalic, atraumatic. Pupils equal round and reactive to light; sclera anicteric; extraocular muscles intact;  Nose without nasal septal hypertrophy Mouth/Parynx benign; Mallinpatti scale 3/4 Neck: Thick neck; no JVD, no carotid bruits; normal carotid upstroke Lungs: clear to ausculatation and percussion; no wheezing or rales Chest wall: without tenderness to palpitation Heart: PMI not displaced, RRR, s1 s2 normal, 1/6 systolic murmur, no diastolic murmur, no rubs, gallops, thrills, or heaves Abdomen: Central adiposity soft, nontender; no hepatosplenomehaly, BS+; abdominal aorta nontender and not dilated by palpation. Back: no CVA tenderness Pulses 2+ Musculoskeletal: full range of motion, normal strength, no joint deformities Extremities: Significant brawny erythema with venous stasis changes bilaterally, swelling no clubbing cyanosis; Homan's sign negative  Neurologic: grossly nonfocal; Cranial nerves grossly wnl Psychologic: Normal mood and affect   Studies/Labs Reviewed:   EKG:  EKG is ordered today. ECG (independently read by me): Atrial fibrillation with a ventricular rate at 60 bpm  Recent Labs: No flowsheet data found.   No flowsheet data found.  No flowsheet data found. No results found for: MCV No results found for: TSH No results found for: HGBA1C  BNP No results found for: BNP  ProBNP No results found for: PROBNP   Lipid Panel  No results found for: CHOL, TRIG, HDL, CHOLHDL, VLDL, LDLCALC, LDLDIRECT   RADIOLOGY: No results found.   Additional studies/ records that were reviewed today include:  I reviewed the records of Dr. Ellyn Hack, his ResMed home sleep study, recent laboratory   ASSESSMENT:     1. OSA (obstructive sleep apnea) on CPAP   2. Essential hypertension   3. Longstanding persistent atrial fibrillation   4. Pulmonary hypertension ([place])   5. Morbid obesity ([place])   6. Chronic diastolic HF (heart failure) ([place])   7. Chronic venous insufficiency   8. Anticoagulated      PLAN:  Mr. Sigurd Sos a very pleasant 78 year old who is retired from [employer] and has a history of morbid obesity, hypertension, chronic atrial fibrillation, significant lower extremity edema with venous stasis changes and various venous surgical procedures who has a history of obstructive sleep apnea for approximately 13 to 14 years.  He had been on therapy originally in [place].  He had moved to [place] in 2019.  I reviewed his home sleep study which reconfirmed the diagnosis of severe sleep apnea with a respiratory event index of 53/h and severe oxygen desaturation to a nadir of 69%.  He received a new ResMed AirSense10 auto CPAP unit and since that time has been on AutoPap with a minimum of 4 and maximum of 16.  His download still shows he is having significant events with an AHI of 10.7.  His 95th percentile pressure is 11.6 and maximum average pressure 12.8.  He is also having significant daily leak in his nasal mask.  At times he does find that he mouth breathes.  I have recommended we change his CPAP minimum pressure to 10 and will increase his maximum pressure up to 18.  I reviewed with him the adverse consequences of sleep apnea on cardiovascular health and I suspect some of his nocturnal palpitations may be related to continued events.  I have suggested he try changing to the new ResMed Airfit F 30i full facemask fully he will tolerate well and provide him with his mask capability, date his oral venting and discomfort with the older fullface masks on the bridge of the nose.  He has been only using CPAP on average 5 hours and 53 minutes.  We discussed the importance of improved sleep duration daily  he should sleep preferably 7 to 8 hours per night if at all possible.  He has a history of pulmonary hypertension which may be contributed by his underlying sleep apnea.  His blood pressure today by me was stable on his regimen consisting of metoprolol 5 mg in the morning and 37.5 mg in the evening patient to his furosemide 20 mg the latter portion of the day, and amlodipine 10 mg daily.  He is on chronic anticoagulation with Xarelto.  He has significant lymphedema and venous stasis changes.  Will obtain a download with his new splints and he will contact us regarding the new mask shoes arise.  As long as he remains stable I will see him in 1 year for follow-up sleep evaluation per Medicare requirements.   Medication Adjustments/Labs and Tests Ordered: Current medicines are reviewed at length with the patient today.  Concerns regarding medicines are outlined above.  Medication changes, Labs and Tests ordered today are listed in the Patient Instructions below. Patient Instructions  Medication Instructions:  The current medical regimen is  effective;  continue present plan and medications.  If you need a refill on your cardiac medications before your next appointment, please call your pharmacy.    Follow-Up: At [facility], you and your health needs are our priority.  As part of our continuing mission to provide you with exceptional heart care, we have created designated Provider Care Teams.  These Care Teams include your primary Cardiologist (physician) and Advanced Practice Providers (APPs -  Physician Assistants and Nurse Practitioners) who all work together to provide you with the care you need, when you need it. . You will need a follow up appointment in 12 months (sleep).  Please call our office 2 months in advance to schedule this appointment.  You may see Dr.Jesicca Dipierro (sleep).        Signed, Shelva Majestic, MD  11/10/2018 2:04 PM    [facility] [address]  [place], [place], [place]  [address] Phone: [phone]

## 2018-11-08 NOTE — Patient Instructions (Signed)
Medication Instructions:  The current medical regimen is effective;  continue present plan and medications.  If you need a refill on your cardiac medications before your next appointment, please call your pharmacy.    Follow-Up: At [facility], you and your health needs are our priority.  As part of our continuing mission to provide you with exceptional heart care, we have created designated Provider Care Teams.  These Care Teams include your primary Cardiologist (physician) and Advanced Practice Providers (APPs -  Physician Assistants and Nurse Practitioners) who all work together to provide you with the care you need, when you need it. . You will need a follow up appointment in 12 months (sleep).  Please call our office 2 months in advance to schedule this appointment.  You may see Dr.Kelly (sleep).

## 2018-11-09 ENCOUNTER — Other Ambulatory Visit: Payer: Self-pay | Admitting: Cardiology

## 2018-11-09 ENCOUNTER — Other Ambulatory Visit: Payer: Self-pay | Admitting: Cardiovascular Disease

## 2018-11-09 ENCOUNTER — Telehealth: Payer: Self-pay | Admitting: *Deleted

## 2018-11-09 DIAGNOSIS — G4733 Obstructive sleep apnea (adult) (pediatric): Secondary | ICD-10-CM

## 2018-11-09 DIAGNOSIS — Z9989 Dependence on other enabling machines and devices: Principal | ICD-10-CM

## 2018-11-09 NOTE — Telephone Encounter (Signed)
Order placed into Epic for Airfit F30-I mask per VO/Dr Claiborne Billings. Staff message sent to Jonn Shingles informing her th order is in [place].

## 2018-11-09 NOTE — Telephone Encounter (Signed)
-----   Message from Caprice Beaver, LPN sent at 1/56/1537 12:23 PM EST ----- Dr.Kelly would like to have an order for new mask- Resmed Airfit F30i  [facility]- DME

## 2018-11-10 ENCOUNTER — Encounter: Payer: Self-pay | Admitting: Cardiovascular Disease

## 2018-11-29 ENCOUNTER — Other Ambulatory Visit: Payer: Self-pay | Admitting: Cardiology

## 2018-12-04 ENCOUNTER — Other Ambulatory Visit: Payer: Self-pay | Admitting: Cardiology

## 2018-12-28 ENCOUNTER — Telehealth: Payer: Self-pay | Admitting: *Deleted

## 2018-12-28 NOTE — Telephone Encounter (Signed)
LEFT MESSAGE TO CALL BACK - NEED SCHEDULE E-VISIT 01/03/19 WITH DR HARDING.

## 2018-12-28 NOTE — Telephone Encounter (Signed)
Follow up:   Patient returning call back concerning appt.

## 2018-12-28 NOTE — Telephone Encounter (Signed)
   TELEPHONE CALL NOTE  This patient has been deemed a candidate for follow-up tele-health visit to limit community exposure during the Covid-19 pandemic. I spoke with the patient via phone to discuss instructions.  The patient will receive a phone call 2-3 days prior to their E-Visit at which time consent will be verbally confirmed. A Virtual Office Visit appointment type has been scheduled for 01/03/19 AT 10 AM with DR HARDING ,  - patient prefers TELEPHONE type.  I have either confirmed the patient is active in [place] or offered to send sign-up link to phone/email via Mychart. PATIENT DECLINED Raiford Simmonds, RN 12/28/2018 9:48 AM

## 2018-12-31 ENCOUNTER — Ambulatory Visit: Payer: Medicare Other | Admitting: Cardiology

## 2019-01-03 ENCOUNTER — Encounter: Payer: Self-pay | Admitting: Cardiology

## 2019-01-03 ENCOUNTER — Telehealth (INDEPENDENT_AMBULATORY_CARE_PROVIDER_SITE_OTHER): Payer: Medicare Other | Admitting: Cardiology

## 2019-01-03 VITALS — BP 124/54 | HR 75 | Ht 73.0 in

## 2019-01-03 DIAGNOSIS — I10 Essential (primary) hypertension: Secondary | ICD-10-CM | POA: Diagnosis not present

## 2019-01-03 DIAGNOSIS — G4733 Obstructive sleep apnea (adult) (pediatric): Secondary | ICD-10-CM

## 2019-01-03 DIAGNOSIS — Z9989 Dependence on other enabling machines and devices: Secondary | ICD-10-CM

## 2019-01-03 DIAGNOSIS — I872 Venous insufficiency (chronic) (peripheral): Secondary | ICD-10-CM

## 2019-01-03 DIAGNOSIS — I5032 Chronic diastolic (congestive) heart failure: Secondary | ICD-10-CM

## 2019-01-03 DIAGNOSIS — I4811 Longstanding persistent atrial fibrillation: Secondary | ICD-10-CM

## 2019-01-03 NOTE — Patient Instructions (Addendum)
   Medication Instructions:  OTHERWISE NO CHANGES EXCEPT CONTINUE TO TAKE LASIX AS DIRECTED   If you need a refill on your cardiac medications before your next appointment, please call your pharmacy.   Lab work: NO CHANGES  If you have labs (blood work) drawn today and your tests are completely normal, you will receive your results only by: [name] MyChart Message (if you have MyChart) OR . A paper copy in the mail If you have any lab test that is abnormal or we need to change your treatment, we will call you to review the results.  Testing/Procedures: NOT NEEDED  Follow-Up: At [facility], you and your health needs are our priority.  As part of our continuing mission to provide you with exceptional heart care, we have created designated Provider Care Teams.  These Care Teams include your primary Cardiologist (physician) and Advanced Practice Providers (APPs -  Physician Assistants and Nurse Practitioners) who all work together to provide you with the care you need, when you need it. You will need a follow up appointment in 5 months OCT 2020.  Please call our office 2 months in advance to schedule this appointment.  You may see Glenetta Hew, MD   or one of the following Advanced Practice Providers on your designated Care Team:   Rosaria Ferries, PA-C . [name], DNP, ANP  Any Other Special Instructions Will Be Listed Below (If Applicable).

## 2019-01-03 NOTE — Progress Notes (Signed)
Virtual Visit via Telephone Note   This visit type was conducted due to national recommendations for restrictions regarding the COVID-19 Pandemic (e.g. social distancing) in an effort to limit this patient's exposure and mitigate transmission in our community.  Due to his co-morbid illnesses, this patient is at least at moderate risk for complications without adequate follow up.  This format is felt to be most appropriate for this patient at this time.  The patient did not have access to video technology/had technical difficulties with video requiring transitioning to audio format only (telephone).  All issues noted in this document were discussed and addressed.  No physical exam could be performed with this format.  Please refer to the patient's chart for his  consent to telehealth for [facility].   Patient does not have a smart phone with camera.  Evaluation Performed:  Follow-up visit  Date:  01/04/2019   ID:  Patrick Bishop, DOB [birth year], MRN [id number]  Patient Location: Home  Provider Location: Home  PCP:  Lujean Amel, MD  Cardiologist:  Glenetta Hew, MD  Electrophysiologist:  None   Chief Complaint:  6 month follow-up  History of Present Illness:    Patrick Bishop is a 78 y.o. male who presents via audio/video conferencing for a telehealth visit today.    Patrick Bishop has a history of chronic persistent A. fib as well as morbid obesity, OSA/OHS on CPAP associated with mild pulmonary pretension and significant bilateral lower extremity edema from lymphedema/venous stasis despite multiple venous surgeries and stenting, no further options.    I initially saw him back to establish cardiology care in February 2019.  Last saw him in October 2019.  He has been maintained on beta-blocker for rate control of his A. fib and on Xarelto. --   INTERVAL HISTORY: Everything is "going pretty well." Still doesn't feel Afib. Stable swelling -- trying to keep feet up.  Will do wraps for  1 week @ a time when able - helps.  Very limited amount of exercise - especially with quarantining - getting some DOE.    Cardiovascular ROS: positive for - dyspnea on exertion, edema and Says edema is pretty stable (see above) negative for - chest pain, irregular heartbeat, orthopnea, palpitations, paroxysmal nocturnal dyspnea, rapid heart rate or Syncope/near syncope or TIA/amaurosis fugax; melena, hematochezia, hematuria or epistaxis. More exertional dyspnea than usual, partly because of deconditioning and now being quarantined in his room.  Hard to walk with OA - feet pain & weight - hard to walk.   Bruising.   ROS:  Please see the history of present illness.    The patient does not have symptoms concerning for COVID-19 infection (fever, chills, cough, or new shortness of breath). Currently on Quarantine - one of the staff members @ his facility was diagnosed with COVID-19  Review of Systems  Constitutional: Positive for malaise/fatigue (Getting more deconditioned, see above). Negative for chills, fever and weight loss.  HENT: Negative for congestion.   Respiratory: Negative for cough and wheezing.   Cardiovascular: Positive for leg swelling (See above).  Gastrointestinal: Negative for abdominal pain, constipation and diarrhea.  Genitourinary: Negative for dysuria.  Musculoskeletal: Positive for back pain, joint pain and neck pain. Negative for falls.       Foot pain.  Neurological: Positive for weakness (Generalized). Negative for dizziness, focal weakness and headaches.  Endo/Heme/Allergies: Bruises/bleeds easily.  Psychiatric/Behavioral: Positive for memory loss. The patient is not nervous/anxious and does not have insomnia.   All other systems reviewed  and are negative.  Tumor (Lipoma) under L arm -- hoping to remove, but worried about it this time - more extensive. 4 hr surgery.   Colonoscopy - benign polyps. Concerned about re-look b/c anesthesia concern.  Past Medical  History:  Diagnosis Date  . Chronic diastolic heart failure (HCC)    Normal LV Fxn by Echo 04/2017   . Chronic venous insufficiency 2015   s/p Bilateral Iliac & Femoral Stents (including IVC) - DEEP Venous Reflux.  Also s/p Bilateral GSV Ablation.  . Essential hypertension   . Hyperlipidemia with target LDL less than 100   . Longstanding persistent atrial fibrillation   . Obesity   . OSA on CPAP    May need New CPAP machine & CPAP MD)  . Pulmonary hypertension ([place])    Past Surgical History:  Procedure Laterality Date  . ABDOMINAL VENOUS DUPLEX Bilateral 07/2014   ([place] Cardiology: Dr. Cleda Mccreedy): (prior to Bilateral Iliac-Femoral Venous Stent Placement): Patent IVC.  No DVT B/L Iliac or Com fem V..  +++ DEEP V REFLUX B/L ILIAC & FEM V.  Absent Superficial V Reflux B/L x L SSV (too tortuous for intervention).  BL GSV occluded - prior ablation.   . ABDOMINAL VENOUS DUPLEX Bilateral 11/2014   Absent DVT.  Left common femoral venous reflux: 1508 MS left common femoral vein, 2284MS left proximal femoral vein -> cystic structure noted in the left popliteal vein (7.6 x 1.4 x 3.8 cm).;  Right leg deep venous reflux noted in the distal iliac, common femoral, proximal profunda femoral, proximal superficial femoral, superficial femoral and popliteal vein.  [name] CARDIAC CATHETERIZATION  10/2009   [facility] Cardiology - Dr. 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  EF~45%.  Mild pulmonary pretension.  [name] CARDIAC PET  08/2009   Inferolateral partially reversible defect -- > false positive by cath  . GREATER SAPHENOUS VEIN ABLATION Bilateral Before 2015   [facility] (Dr. Eugenie Filler)  . IR TRANSCATH PLC STENT  EA ADD VEIN  INC ANGIOPLASTY Left 10/2014   [facility], Dr. 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 IR TRANSCATH PLC STENT  INITIAL VEIN  INC ANGIOPLASTY Right 08/2014   [facility] - Dr. Stormy Fabian): [place] Guided --> R Common & External Iliac, Common Femoral Venography with Placement of 24 mmx 70 mm Stent - R Com Iliac-Ext Iliac- & Com Fem V. reducing 60-80% venous compression to ~0%).;; also noted significant L-sided venous compression (staged stenting).   [name] NM MYOVIEW LTD  11/2005   Adenosine Myoview [facility] Cardiology - Dr. Cleda Mccreedy): Normal myocardial perfusion scan. No ischemia or infarction.  . TRANSTHORACIC ECHOCARDIOGRAM  04/26/2017    [facility] - Dr. Cleda Mccreedy) - report not available.  Per clinic note: Normal global LV function. Mild concentric temperature. Mild TR. Mild pulmonary hypertension.;;   . TRANSTHORACIC ECHOCARDIOGRAM  7/'15; 7/'17   [facility] Cardiology - Dr. Saverio Danker) a) low normal LV function (EF 56%). Moderate concentric LVH. Mild LA dilation. Normal pulmonary pressures.  ;; b) normal global function - EF 60-65%. Moderate pulmonary hypertension. Biatrial enlargement. Moderate concentric LVH.  [name] TRANSTHORACIC ECHOCARDIOGRAM  02/2010   [facility] Cardiology - Dr. Saverio Danker): Normal global LV function. Mild LVH. EF 60%.;  November 2010. Moderate LV dilation. Moderate LVH. Normal EF 50-55%. Mild to moderate pulmonary hypertension. (July 2012 showed normal function with mild LVH, mild MR noted in August 2014)     Current Meds  Medication Sig  . amLODipine (NORVASC) 10 MG tablet TAKE 1 TABLET DAILY  . amoxicillin-clavulanate (AUGMENTIN) 875-125 MG tablet   . atorvastatin (LIPITOR) 10 MG tablet TAKE 1 TABLET DAILY AT 6 P.M.  . furosemide (LASIX) 40 MG tablet Take  20 mg  ( 1/2 tablet) twice a day except on Monday and Thursday take 40 mg twice a day.  [name] HYDROcodone-acetaminophen (NORCO/VICODIN) 5-325 MG tablet   . metoprolol tartrate (LOPRESSOR) 25 MG tablet Take 1 tablet (25 mg total) by mouth every  morning AND 1.5 tablets (37.5 mg total) every evening.  Alveda Reasons 20 MG TABS tablet TAKE 1 TABLET DAILY WITH SUPPER  . [DISCONTINUED] furosemide (LASIX) 40 MG tablet TAKE 1 TABLET IN THE MORNING AND TAKE ONE-HALF (1/2) TABLET IN THE EVENING. MAY TAKE AN EXTRA TABLET IF NEEDED FOR SWELLING     Allergies:   Aldactone [spironolactone]   Social History   Tobacco Use  . Smoking status: Former Smoker    Packs/day: 1.00    Years: 35.00    Pack years: 35.00    Types: Cigarettes    Last attempt to quit: 1996    Years since quitting: 24.2  . Smokeless tobacco: Never Used  . Tobacco comment: smoked from age [age]   Substance Use Topics  . Alcohol use: No    Frequency: Never    Comment: Not any more  . Drug use: No     Family Hx: The patient's family history includes Cancer in his mother and sister; Heart attack (age of onset: 22) in his father; Hypertension in his brother. There is no history of Other.   Prior CV studies:   The following studies were reviewed today: none   Labs/Other Tests and Data Reviewed:    EKG:  No ECG reviewed.  Recent Labs: No results found for requested labs within last 8760 hours.   Recent Lipid Panel No results found for: CHOL, TRIG, HDL, CHOLHDL, LDLCALC, LDLDIRECT  Wt Readings from Last 3 Encounters:  11/08/18 (!) 370 lb (167.8 kg)  07/02/18 (!) 366 lb (166 kg)  12/29/17 (!) 361 lb (163.7 kg)     Objective:    Vital Signs:  BP (!) 124/54   Pulse 75   Ht 6\' 1"  (1.854 m)   BMI 48.82 kg/m   Seems to not be under any significant distress over the telephone.  No respiratory distress noted.  ASSESSMENT & PLAN:    Problem List Items Addressed This Visit    Chronic diastolic HF (heart failure) (HCC) (Chronic)    Not really sure that he is having much the mid to diastolic heart failure symptoms besides his exertional dyspnea.  Certainly with A. fib is complicit in this as well. I do not think his edema is really related to heart failure as much  as it is related to pulmonary pretension from OSA/OHS.  Continue current Lasix dosing with PRN additional dosing..      Chronic venous insufficiency (Chronic)    Status post arterial and venous peripheral stents.  He now has longstanding venous insufficiency and lymphedema.  He says that when he is able to actually do his wraps for week at a time, he the edema goes down nicely with this.  But it is hard to find some help  and do the wraps as often.  Now especially with the COVID-19 restrictions in him being under quarantine because of potential exposure with a staff member from his facility, he is not able to have contact to put these wraps on.  Unfortunately no further procedures available to help.      Essential hypertension - Primary (Chronic)    Blood pressure well controlled on current meds.  No change.      Longstanding persistent atrial fibrillation: CHA2DSVasc = ~3 (age x 2, HTN) (Chronic)    Continues to be asymptomatic with his A. fib.  Rate controlled.  Not really noticing that much the way of any tachycardia episodes decide sometimes at nighttime. Well-controlled rate with metoprolol.  Better is having increase his nighttime dose.  Doing well on Xarelto.  No bleeding issues.   We stopped aspirin last visit.      Morbid obesity (HCC) (Chronic)   OSA on CPAP (Chronic)    Seems to be doing okay with CPAP.         COVID-19 Education: The signs and symptoms of COVID-19 were discussed with the patient and how to seek care for testing (follow up with PCP or arrange E-visit).  The importance of social distancing was discussed today.  He is quarantined because having a staff member at his living facility testing positive for COVID-19.  Not having symptoms.  Time:   Today, I have spent 16 minutes with the patient with telehealth technology discussing the above problems.  We also spent about 5 minutes talking about his concerns for having colonoscopy evaluation.  Anesthesiology  seem to be concerned about oxygenation.  I suggested that maybe it is time to consider expectant monitoring and not screening.  Perhaps not having these procedures done to avoid complications is the best thing.   Medication Adjustments/Labs and Tests Ordered: Current medicines are reviewed at length with the patient today.  Concerns regarding medicines are outlined above.  Tests Ordered: No orders of the defined types were placed in this encounter.  Medication Changes: No orders of the defined types were placed in this encounter.   Disposition:  Follow up in October   Signed, David Harding, MD  01/04/2019 2:40 PM    [facility]

## 2019-01-04 ENCOUNTER — Encounter: Payer: Self-pay | Admitting: Cardiology

## 2019-01-04 NOTE — Assessment & Plan Note (Signed)
Status post arterial and venous peripheral stents.  He now has longstanding venous insufficiency and lymphedema.  He says that when he is able to actually do his wraps for week at a time, he the edema goes down nicely with this.  But it is hard to find some help and do the wraps as often.  Now especially with the COVID-19 restrictions in him being under quarantine because of potential exposure with a staff member from his facility, he is not able to have contact to put these wraps on.  Unfortunately no further procedures available to help.

## 2019-01-04 NOTE — Assessment & Plan Note (Signed)
Continues to be asymptomatic with his A. fib.  Rate controlled.  Not really noticing that much the way of any tachycardia episodes decide sometimes at nighttime. Well-controlled rate with metoprolol.  Better is having increase his nighttime dose.  Doing well on Xarelto.  No bleeding issues.   We stopped aspirin last visit.

## 2019-01-04 NOTE — Assessment & Plan Note (Signed)
Seems to be doing okay with CPAP.

## 2019-01-04 NOTE — Assessment & Plan Note (Signed)
Blood pressure well controlled on current meds.  No change.

## 2019-01-04 NOTE — Assessment & Plan Note (Signed)
Not really sure that he is having much the mid to diastolic heart failure symptoms besides his exertional dyspnea.  Certainly with A. fib is complicit in this as well. I do not think his edema is really related to heart failure as much as it is related to pulmonary pretension from OSA/OHS.  Continue current Lasix dosing with PRN additional dosing.[name]

## 2019-01-07 DIAGNOSIS — R601 Generalized edema: Secondary | ICD-10-CM | POA: Diagnosis not present

## 2019-01-07 DIAGNOSIS — M6281 Muscle weakness (generalized): Secondary | ICD-10-CM | POA: Diagnosis not present

## 2019-01-07 DIAGNOSIS — M25561 Pain in right knee: Secondary | ICD-10-CM | POA: Diagnosis not present

## 2019-01-07 DIAGNOSIS — M25562 Pain in left knee: Secondary | ICD-10-CM | POA: Diagnosis not present

## 2019-01-08 DIAGNOSIS — M25562 Pain in left knee: Secondary | ICD-10-CM | POA: Diagnosis not present

## 2019-01-08 DIAGNOSIS — R601 Generalized edema: Secondary | ICD-10-CM | POA: Diagnosis not present

## 2019-01-08 DIAGNOSIS — M6281 Muscle weakness (generalized): Secondary | ICD-10-CM | POA: Diagnosis not present

## 2019-01-08 DIAGNOSIS — M25561 Pain in right knee: Secondary | ICD-10-CM | POA: Diagnosis not present

## 2019-01-09 DIAGNOSIS — R601 Generalized edema: Secondary | ICD-10-CM | POA: Diagnosis not present

## 2019-01-09 DIAGNOSIS — M25561 Pain in right knee: Secondary | ICD-10-CM | POA: Diagnosis not present

## 2019-01-09 DIAGNOSIS — M6281 Muscle weakness (generalized): Secondary | ICD-10-CM | POA: Diagnosis not present

## 2019-01-09 DIAGNOSIS — M25562 Pain in left knee: Secondary | ICD-10-CM | POA: Diagnosis not present

## 2019-01-10 DIAGNOSIS — M25562 Pain in left knee: Secondary | ICD-10-CM | POA: Diagnosis not present

## 2019-01-10 DIAGNOSIS — M6281 Muscle weakness (generalized): Secondary | ICD-10-CM | POA: Diagnosis not present

## 2019-01-10 DIAGNOSIS — R601 Generalized edema: Secondary | ICD-10-CM | POA: Diagnosis not present

## 2019-01-10 DIAGNOSIS — M25561 Pain in right knee: Secondary | ICD-10-CM | POA: Diagnosis not present

## 2019-01-11 DIAGNOSIS — M6281 Muscle weakness (generalized): Secondary | ICD-10-CM | POA: Diagnosis not present

## 2019-01-11 DIAGNOSIS — M25561 Pain in right knee: Secondary | ICD-10-CM | POA: Diagnosis not present

## 2019-01-11 DIAGNOSIS — R601 Generalized edema: Secondary | ICD-10-CM | POA: Diagnosis not present

## 2019-01-11 DIAGNOSIS — M25562 Pain in left knee: Secondary | ICD-10-CM | POA: Diagnosis not present

## 2019-01-14 DIAGNOSIS — M25562 Pain in left knee: Secondary | ICD-10-CM | POA: Diagnosis not present

## 2019-01-14 DIAGNOSIS — M25561 Pain in right knee: Secondary | ICD-10-CM | POA: Diagnosis not present

## 2019-01-14 DIAGNOSIS — M6281 Muscle weakness (generalized): Secondary | ICD-10-CM | POA: Diagnosis not present

## 2019-01-14 DIAGNOSIS — R601 Generalized edema: Secondary | ICD-10-CM | POA: Diagnosis not present

## 2019-01-15 DIAGNOSIS — R601 Generalized edema: Secondary | ICD-10-CM | POA: Diagnosis not present

## 2019-01-15 DIAGNOSIS — M6281 Muscle weakness (generalized): Secondary | ICD-10-CM | POA: Diagnosis not present

## 2019-01-15 DIAGNOSIS — M25562 Pain in left knee: Secondary | ICD-10-CM | POA: Diagnosis not present

## 2019-01-15 DIAGNOSIS — M25561 Pain in right knee: Secondary | ICD-10-CM | POA: Diagnosis not present

## 2019-01-16 DIAGNOSIS — M6281 Muscle weakness (generalized): Secondary | ICD-10-CM | POA: Diagnosis not present

## 2019-01-16 DIAGNOSIS — M25562 Pain in left knee: Secondary | ICD-10-CM | POA: Diagnosis not present

## 2019-01-16 DIAGNOSIS — M25561 Pain in right knee: Secondary | ICD-10-CM | POA: Diagnosis not present

## 2019-01-16 DIAGNOSIS — R601 Generalized edema: Secondary | ICD-10-CM | POA: Diagnosis not present

## 2019-01-17 DIAGNOSIS — M25561 Pain in right knee: Secondary | ICD-10-CM | POA: Diagnosis not present

## 2019-01-17 DIAGNOSIS — R601 Generalized edema: Secondary | ICD-10-CM | POA: Diagnosis not present

## 2019-01-17 DIAGNOSIS — M6281 Muscle weakness (generalized): Secondary | ICD-10-CM | POA: Diagnosis not present

## 2019-01-17 DIAGNOSIS — M25562 Pain in left knee: Secondary | ICD-10-CM | POA: Diagnosis not present

## 2019-01-18 DIAGNOSIS — M25562 Pain in left knee: Secondary | ICD-10-CM | POA: Diagnosis not present

## 2019-01-18 DIAGNOSIS — M6281 Muscle weakness (generalized): Secondary | ICD-10-CM | POA: Diagnosis not present

## 2019-01-18 DIAGNOSIS — R601 Generalized edema: Secondary | ICD-10-CM | POA: Diagnosis not present

## 2019-01-18 DIAGNOSIS — M25561 Pain in right knee: Secondary | ICD-10-CM | POA: Diagnosis not present

## 2019-01-21 DIAGNOSIS — M6281 Muscle weakness (generalized): Secondary | ICD-10-CM | POA: Diagnosis not present

## 2019-01-21 DIAGNOSIS — R601 Generalized edema: Secondary | ICD-10-CM | POA: Diagnosis not present

## 2019-01-21 DIAGNOSIS — M25561 Pain in right knee: Secondary | ICD-10-CM | POA: Diagnosis not present

## 2019-01-21 DIAGNOSIS — M25562 Pain in left knee: Secondary | ICD-10-CM | POA: Diagnosis not present

## 2019-01-22 DIAGNOSIS — M25562 Pain in left knee: Secondary | ICD-10-CM | POA: Diagnosis not present

## 2019-01-22 DIAGNOSIS — M25561 Pain in right knee: Secondary | ICD-10-CM | POA: Diagnosis not present

## 2019-01-22 DIAGNOSIS — M6281 Muscle weakness (generalized): Secondary | ICD-10-CM | POA: Diagnosis not present

## 2019-01-22 DIAGNOSIS — R601 Generalized edema: Secondary | ICD-10-CM | POA: Diagnosis not present

## 2019-01-23 DIAGNOSIS — R601 Generalized edema: Secondary | ICD-10-CM | POA: Diagnosis not present

## 2019-01-23 DIAGNOSIS — M6281 Muscle weakness (generalized): Secondary | ICD-10-CM | POA: Diagnosis not present

## 2019-01-23 DIAGNOSIS — M25562 Pain in left knee: Secondary | ICD-10-CM | POA: Diagnosis not present

## 2019-01-23 DIAGNOSIS — M25561 Pain in right knee: Secondary | ICD-10-CM | POA: Diagnosis not present

## 2019-01-24 DIAGNOSIS — M6281 Muscle weakness (generalized): Secondary | ICD-10-CM | POA: Diagnosis not present

## 2019-01-24 DIAGNOSIS — M25562 Pain in left knee: Secondary | ICD-10-CM | POA: Diagnosis not present

## 2019-01-24 DIAGNOSIS — R601 Generalized edema: Secondary | ICD-10-CM | POA: Diagnosis not present

## 2019-01-24 DIAGNOSIS — M25561 Pain in right knee: Secondary | ICD-10-CM | POA: Diagnosis not present

## 2019-01-26 DIAGNOSIS — R601 Generalized edema: Secondary | ICD-10-CM | POA: Diagnosis not present

## 2019-01-26 DIAGNOSIS — M6281 Muscle weakness (generalized): Secondary | ICD-10-CM | POA: Diagnosis not present

## 2019-01-26 DIAGNOSIS — M25562 Pain in left knee: Secondary | ICD-10-CM | POA: Diagnosis not present

## 2019-01-26 DIAGNOSIS — M25561 Pain in right knee: Secondary | ICD-10-CM | POA: Diagnosis not present

## 2019-01-28 DIAGNOSIS — M25562 Pain in left knee: Secondary | ICD-10-CM | POA: Diagnosis not present

## 2019-01-28 DIAGNOSIS — M25561 Pain in right knee: Secondary | ICD-10-CM | POA: Diagnosis not present

## 2019-01-28 DIAGNOSIS — R601 Generalized edema: Secondary | ICD-10-CM | POA: Diagnosis not present

## 2019-01-28 DIAGNOSIS — M6281 Muscle weakness (generalized): Secondary | ICD-10-CM | POA: Diagnosis not present

## 2019-01-29 DIAGNOSIS — M6281 Muscle weakness (generalized): Secondary | ICD-10-CM | POA: Diagnosis not present

## 2019-01-29 DIAGNOSIS — R601 Generalized edema: Secondary | ICD-10-CM | POA: Diagnosis not present

## 2019-01-29 DIAGNOSIS — M25561 Pain in right knee: Secondary | ICD-10-CM | POA: Diagnosis not present

## 2019-01-29 DIAGNOSIS — M25562 Pain in left knee: Secondary | ICD-10-CM | POA: Diagnosis not present

## 2019-01-30 DIAGNOSIS — M25561 Pain in right knee: Secondary | ICD-10-CM | POA: Diagnosis not present

## 2019-01-30 DIAGNOSIS — R601 Generalized edema: Secondary | ICD-10-CM | POA: Diagnosis not present

## 2019-01-30 DIAGNOSIS — M6281 Muscle weakness (generalized): Secondary | ICD-10-CM | POA: Diagnosis not present

## 2019-01-30 DIAGNOSIS — M25562 Pain in left knee: Secondary | ICD-10-CM | POA: Diagnosis not present

## 2019-01-31 DIAGNOSIS — R601 Generalized edema: Secondary | ICD-10-CM | POA: Diagnosis not present

## 2019-01-31 DIAGNOSIS — M25562 Pain in left knee: Secondary | ICD-10-CM | POA: Diagnosis not present

## 2019-01-31 DIAGNOSIS — M6281 Muscle weakness (generalized): Secondary | ICD-10-CM | POA: Diagnosis not present

## 2019-01-31 DIAGNOSIS — M25561 Pain in right knee: Secondary | ICD-10-CM | POA: Diagnosis not present

## 2019-02-01 DIAGNOSIS — M25562 Pain in left knee: Secondary | ICD-10-CM | POA: Diagnosis not present

## 2019-02-01 DIAGNOSIS — H5202 Hypermetropia, left eye: Secondary | ICD-10-CM | POA: Diagnosis not present

## 2019-02-01 DIAGNOSIS — H2511 Age-related nuclear cataract, right eye: Secondary | ICD-10-CM | POA: Diagnosis not present

## 2019-02-01 DIAGNOSIS — R601 Generalized edema: Secondary | ICD-10-CM | POA: Diagnosis not present

## 2019-02-01 DIAGNOSIS — M6281 Muscle weakness (generalized): Secondary | ICD-10-CM | POA: Diagnosis not present

## 2019-02-01 DIAGNOSIS — M25561 Pain in right knee: Secondary | ICD-10-CM | POA: Diagnosis not present

## 2019-02-04 DIAGNOSIS — R601 Generalized edema: Secondary | ICD-10-CM | POA: Diagnosis not present

## 2019-02-04 DIAGNOSIS — M6281 Muscle weakness (generalized): Secondary | ICD-10-CM | POA: Diagnosis not present

## 2019-02-04 DIAGNOSIS — M25561 Pain in right knee: Secondary | ICD-10-CM | POA: Diagnosis not present

## 2019-02-04 DIAGNOSIS — M25562 Pain in left knee: Secondary | ICD-10-CM | POA: Diagnosis not present

## 2019-02-05 DIAGNOSIS — M170 Bilateral primary osteoarthritis of knee: Secondary | ICD-10-CM | POA: Diagnosis not present

## 2019-02-05 DIAGNOSIS — I10 Essential (primary) hypertension: Secondary | ICD-10-CM | POA: Diagnosis not present

## 2019-02-05 DIAGNOSIS — M25561 Pain in right knee: Secondary | ICD-10-CM | POA: Diagnosis not present

## 2019-02-05 DIAGNOSIS — R601 Generalized edema: Secondary | ICD-10-CM | POA: Diagnosis not present

## 2019-02-05 DIAGNOSIS — M25562 Pain in left knee: Secondary | ICD-10-CM | POA: Diagnosis not present

## 2019-02-05 DIAGNOSIS — M6281 Muscle weakness (generalized): Secondary | ICD-10-CM | POA: Diagnosis not present

## 2019-02-05 DIAGNOSIS — I890 Lymphedema, not elsewhere classified: Secondary | ICD-10-CM | POA: Diagnosis not present

## 2019-02-06 DIAGNOSIS — R601 Generalized edema: Secondary | ICD-10-CM | POA: Diagnosis not present

## 2019-02-06 DIAGNOSIS — M25561 Pain in right knee: Secondary | ICD-10-CM | POA: Diagnosis not present

## 2019-02-06 DIAGNOSIS — M6281 Muscle weakness (generalized): Secondary | ICD-10-CM | POA: Diagnosis not present

## 2019-02-06 DIAGNOSIS — M25562 Pain in left knee: Secondary | ICD-10-CM | POA: Diagnosis not present

## 2019-02-07 ENCOUNTER — Telehealth: Payer: Self-pay

## 2019-02-07 DIAGNOSIS — M25562 Pain in left knee: Secondary | ICD-10-CM | POA: Diagnosis not present

## 2019-02-07 DIAGNOSIS — R601 Generalized edema: Secondary | ICD-10-CM | POA: Diagnosis not present

## 2019-02-07 DIAGNOSIS — M6281 Muscle weakness (generalized): Secondary | ICD-10-CM | POA: Diagnosis not present

## 2019-02-07 DIAGNOSIS — M25561 Pain in right knee: Secondary | ICD-10-CM | POA: Diagnosis not present

## 2019-02-07 NOTE — Telephone Encounter (Signed)
   [facility] Pre-operative Risk Assessment    Request for surgical clearance:  1. What type of surgery is being performed? Cataract Surgery   2. When is this surgery scheduled? 02/13/19   3. What type of clearance is required (medical clearance vs. Pharmacy clearance to hold med vs. Both)? Both  4. Are there any medications that need to be held prior to surgery and how long? Xarelto-7 days prior   5. Practice name and name of physician performing surgery? [facility]    6. What is your office phone number [phone]    7.   What is your office fax number [phone]  8.   Anesthesia type (None, local, MAC, general) ? Local (eye block) with moderate sedation.   Meryl Crutch 02/07/2019, 8:25 AM  _________________________________________________________________   (provider comments below)

## 2019-02-07 NOTE — Telephone Encounter (Signed)
   Primary Cardiologist: Glenetta Hew, MD  Chart reviewed as part of pre-operative protocol coverage. Cataract extractions are recognized in guidelines as low risk surgeries that do not typically require specific preoperative testing or holding of blood thinner therapy. Therefore, given past medical history and time since last visit, based on ACC/AHA guidelines, Daniel Ritthaler would be at acceptable risk for the planned procedure without further cardiovascular testing.   I will route this recommendation to the requesting party via Epic fax function and remove from pre-op pool.  Please call with questions.  Daune Perch, NP 02/07/2019, 8:39 AM

## 2019-02-10 DIAGNOSIS — M25562 Pain in left knee: Secondary | ICD-10-CM | POA: Diagnosis not present

## 2019-02-10 DIAGNOSIS — M25561 Pain in right knee: Secondary | ICD-10-CM | POA: Diagnosis not present

## 2019-02-10 DIAGNOSIS — M6281 Muscle weakness (generalized): Secondary | ICD-10-CM | POA: Diagnosis not present

## 2019-02-10 DIAGNOSIS — R601 Generalized edema: Secondary | ICD-10-CM | POA: Diagnosis not present

## 2019-02-11 DIAGNOSIS — M6281 Muscle weakness (generalized): Secondary | ICD-10-CM | POA: Diagnosis not present

## 2019-02-11 DIAGNOSIS — R601 Generalized edema: Secondary | ICD-10-CM | POA: Diagnosis not present

## 2019-02-11 DIAGNOSIS — M25562 Pain in left knee: Secondary | ICD-10-CM | POA: Diagnosis not present

## 2019-02-11 DIAGNOSIS — M25561 Pain in right knee: Secondary | ICD-10-CM | POA: Diagnosis not present

## 2019-02-12 DIAGNOSIS — R601 Generalized edema: Secondary | ICD-10-CM | POA: Diagnosis not present

## 2019-02-12 DIAGNOSIS — M25562 Pain in left knee: Secondary | ICD-10-CM | POA: Diagnosis not present

## 2019-02-12 DIAGNOSIS — M6281 Muscle weakness (generalized): Secondary | ICD-10-CM | POA: Diagnosis not present

## 2019-02-12 DIAGNOSIS — M25561 Pain in right knee: Secondary | ICD-10-CM | POA: Diagnosis not present

## 2019-02-13 DIAGNOSIS — M25562 Pain in left knee: Secondary | ICD-10-CM | POA: Diagnosis not present

## 2019-02-13 DIAGNOSIS — M25561 Pain in right knee: Secondary | ICD-10-CM | POA: Diagnosis not present

## 2019-02-13 DIAGNOSIS — R601 Generalized edema: Secondary | ICD-10-CM | POA: Diagnosis not present

## 2019-02-13 DIAGNOSIS — M6281 Muscle weakness (generalized): Secondary | ICD-10-CM | POA: Diagnosis not present

## 2019-02-14 DIAGNOSIS — R601 Generalized edema: Secondary | ICD-10-CM | POA: Diagnosis not present

## 2019-02-14 DIAGNOSIS — M6281 Muscle weakness (generalized): Secondary | ICD-10-CM | POA: Diagnosis not present

## 2019-02-14 DIAGNOSIS — M25561 Pain in right knee: Secondary | ICD-10-CM | POA: Diagnosis not present

## 2019-02-14 DIAGNOSIS — M25562 Pain in left knee: Secondary | ICD-10-CM | POA: Diagnosis not present

## 2019-02-15 DIAGNOSIS — M6281 Muscle weakness (generalized): Secondary | ICD-10-CM | POA: Diagnosis not present

## 2019-02-15 DIAGNOSIS — M25562 Pain in left knee: Secondary | ICD-10-CM | POA: Diagnosis not present

## 2019-02-15 DIAGNOSIS — M25561 Pain in right knee: Secondary | ICD-10-CM | POA: Diagnosis not present

## 2019-02-15 DIAGNOSIS — R601 Generalized edema: Secondary | ICD-10-CM | POA: Diagnosis not present

## 2019-02-18 DIAGNOSIS — R601 Generalized edema: Secondary | ICD-10-CM | POA: Diagnosis not present

## 2019-02-18 DIAGNOSIS — M6281 Muscle weakness (generalized): Secondary | ICD-10-CM | POA: Diagnosis not present

## 2019-02-18 DIAGNOSIS — M25562 Pain in left knee: Secondary | ICD-10-CM | POA: Diagnosis not present

## 2019-02-18 DIAGNOSIS — M25561 Pain in right knee: Secondary | ICD-10-CM | POA: Diagnosis not present

## 2019-02-19 DIAGNOSIS — M6281 Muscle weakness (generalized): Secondary | ICD-10-CM | POA: Diagnosis not present

## 2019-02-19 DIAGNOSIS — M25561 Pain in right knee: Secondary | ICD-10-CM | POA: Diagnosis not present

## 2019-02-19 DIAGNOSIS — M25562 Pain in left knee: Secondary | ICD-10-CM | POA: Diagnosis not present

## 2019-02-19 DIAGNOSIS — R601 Generalized edema: Secondary | ICD-10-CM | POA: Diagnosis not present

## 2019-02-20 DIAGNOSIS — M6281 Muscle weakness (generalized): Secondary | ICD-10-CM | POA: Diagnosis not present

## 2019-02-20 DIAGNOSIS — M25561 Pain in right knee: Secondary | ICD-10-CM | POA: Diagnosis not present

## 2019-02-20 DIAGNOSIS — R601 Generalized edema: Secondary | ICD-10-CM | POA: Diagnosis not present

## 2019-02-20 DIAGNOSIS — M25562 Pain in left knee: Secondary | ICD-10-CM | POA: Diagnosis not present

## 2019-02-21 DIAGNOSIS — M25561 Pain in right knee: Secondary | ICD-10-CM | POA: Diagnosis not present

## 2019-02-21 DIAGNOSIS — R601 Generalized edema: Secondary | ICD-10-CM | POA: Diagnosis not present

## 2019-02-21 DIAGNOSIS — M6281 Muscle weakness (generalized): Secondary | ICD-10-CM | POA: Diagnosis not present

## 2019-02-21 DIAGNOSIS — M25562 Pain in left knee: Secondary | ICD-10-CM | POA: Diagnosis not present

## 2019-02-22 DIAGNOSIS — M25562 Pain in left knee: Secondary | ICD-10-CM | POA: Diagnosis not present

## 2019-02-22 DIAGNOSIS — M25561 Pain in right knee: Secondary | ICD-10-CM | POA: Diagnosis not present

## 2019-02-22 DIAGNOSIS — M6281 Muscle weakness (generalized): Secondary | ICD-10-CM | POA: Diagnosis not present

## 2019-02-22 DIAGNOSIS — R601 Generalized edema: Secondary | ICD-10-CM | POA: Diagnosis not present

## 2019-02-25 DIAGNOSIS — M25561 Pain in right knee: Secondary | ICD-10-CM | POA: Diagnosis not present

## 2019-02-25 DIAGNOSIS — M25562 Pain in left knee: Secondary | ICD-10-CM | POA: Diagnosis not present

## 2019-02-25 DIAGNOSIS — R601 Generalized edema: Secondary | ICD-10-CM | POA: Diagnosis not present

## 2019-02-25 DIAGNOSIS — M6281 Muscle weakness (generalized): Secondary | ICD-10-CM | POA: Diagnosis not present

## 2019-02-26 DIAGNOSIS — R601 Generalized edema: Secondary | ICD-10-CM | POA: Diagnosis not present

## 2019-02-26 DIAGNOSIS — M25561 Pain in right knee: Secondary | ICD-10-CM | POA: Diagnosis not present

## 2019-02-26 DIAGNOSIS — M6281 Muscle weakness (generalized): Secondary | ICD-10-CM | POA: Diagnosis not present

## 2019-02-26 DIAGNOSIS — M25562 Pain in left knee: Secondary | ICD-10-CM | POA: Diagnosis not present

## 2019-02-27 DIAGNOSIS — M6281 Muscle weakness (generalized): Secondary | ICD-10-CM | POA: Diagnosis not present

## 2019-02-27 DIAGNOSIS — R601 Generalized edema: Secondary | ICD-10-CM | POA: Diagnosis not present

## 2019-02-27 DIAGNOSIS — M25562 Pain in left knee: Secondary | ICD-10-CM | POA: Diagnosis not present

## 2019-02-27 DIAGNOSIS — M25561 Pain in right knee: Secondary | ICD-10-CM | POA: Diagnosis not present

## 2019-02-28 DIAGNOSIS — R601 Generalized edema: Secondary | ICD-10-CM | POA: Diagnosis not present

## 2019-02-28 DIAGNOSIS — M25562 Pain in left knee: Secondary | ICD-10-CM | POA: Diagnosis not present

## 2019-02-28 DIAGNOSIS — M25561 Pain in right knee: Secondary | ICD-10-CM | POA: Diagnosis not present

## 2019-02-28 DIAGNOSIS — M6281 Muscle weakness (generalized): Secondary | ICD-10-CM | POA: Diagnosis not present

## 2019-03-01 DIAGNOSIS — R601 Generalized edema: Secondary | ICD-10-CM | POA: Diagnosis not present

## 2019-03-01 DIAGNOSIS — M25561 Pain in right knee: Secondary | ICD-10-CM | POA: Diagnosis not present

## 2019-03-01 DIAGNOSIS — M6281 Muscle weakness (generalized): Secondary | ICD-10-CM | POA: Diagnosis not present

## 2019-03-01 DIAGNOSIS — M25562 Pain in left knee: Secondary | ICD-10-CM | POA: Diagnosis not present

## 2019-03-04 DIAGNOSIS — R601 Generalized edema: Secondary | ICD-10-CM | POA: Diagnosis not present

## 2019-03-04 DIAGNOSIS — M6281 Muscle weakness (generalized): Secondary | ICD-10-CM | POA: Diagnosis not present

## 2019-03-04 DIAGNOSIS — M25562 Pain in left knee: Secondary | ICD-10-CM | POA: Diagnosis not present

## 2019-03-04 DIAGNOSIS — M25561 Pain in right knee: Secondary | ICD-10-CM | POA: Diagnosis not present

## 2019-03-05 DIAGNOSIS — M25562 Pain in left knee: Secondary | ICD-10-CM | POA: Diagnosis not present

## 2019-03-05 DIAGNOSIS — R601 Generalized edema: Secondary | ICD-10-CM | POA: Diagnosis not present

## 2019-03-05 DIAGNOSIS — M25561 Pain in right knee: Secondary | ICD-10-CM | POA: Diagnosis not present

## 2019-03-05 DIAGNOSIS — M6281 Muscle weakness (generalized): Secondary | ICD-10-CM | POA: Diagnosis not present

## 2019-03-06 DIAGNOSIS — M25561 Pain in right knee: Secondary | ICD-10-CM | POA: Diagnosis not present

## 2019-03-06 DIAGNOSIS — M6281 Muscle weakness (generalized): Secondary | ICD-10-CM | POA: Diagnosis not present

## 2019-03-06 DIAGNOSIS — R601 Generalized edema: Secondary | ICD-10-CM | POA: Diagnosis not present

## 2019-03-06 DIAGNOSIS — M25562 Pain in left knee: Secondary | ICD-10-CM | POA: Diagnosis not present

## 2019-03-07 DIAGNOSIS — R601 Generalized edema: Secondary | ICD-10-CM | POA: Diagnosis not present

## 2019-03-07 DIAGNOSIS — M6281 Muscle weakness (generalized): Secondary | ICD-10-CM | POA: Diagnosis not present

## 2019-03-07 DIAGNOSIS — M25561 Pain in right knee: Secondary | ICD-10-CM | POA: Diagnosis not present

## 2019-03-07 DIAGNOSIS — M25562 Pain in left knee: Secondary | ICD-10-CM | POA: Diagnosis not present

## 2019-03-08 DIAGNOSIS — M25562 Pain in left knee: Secondary | ICD-10-CM | POA: Diagnosis not present

## 2019-03-08 DIAGNOSIS — M25561 Pain in right knee: Secondary | ICD-10-CM | POA: Diagnosis not present

## 2019-03-08 DIAGNOSIS — R601 Generalized edema: Secondary | ICD-10-CM | POA: Diagnosis not present

## 2019-03-08 DIAGNOSIS — M6281 Muscle weakness (generalized): Secondary | ICD-10-CM | POA: Diagnosis not present

## 2019-03-11 DIAGNOSIS — M25561 Pain in right knee: Secondary | ICD-10-CM | POA: Diagnosis not present

## 2019-03-11 DIAGNOSIS — M6281 Muscle weakness (generalized): Secondary | ICD-10-CM | POA: Diagnosis not present

## 2019-03-11 DIAGNOSIS — M25562 Pain in left knee: Secondary | ICD-10-CM | POA: Diagnosis not present

## 2019-03-11 DIAGNOSIS — R601 Generalized edema: Secondary | ICD-10-CM | POA: Diagnosis not present

## 2019-03-12 DIAGNOSIS — M6281 Muscle weakness (generalized): Secondary | ICD-10-CM | POA: Diagnosis not present

## 2019-03-12 DIAGNOSIS — M25562 Pain in left knee: Secondary | ICD-10-CM | POA: Diagnosis not present

## 2019-03-12 DIAGNOSIS — R601 Generalized edema: Secondary | ICD-10-CM | POA: Diagnosis not present

## 2019-03-12 DIAGNOSIS — M25561 Pain in right knee: Secondary | ICD-10-CM | POA: Diagnosis not present

## 2019-03-13 DIAGNOSIS — M25561 Pain in right knee: Secondary | ICD-10-CM | POA: Diagnosis not present

## 2019-03-13 DIAGNOSIS — M6281 Muscle weakness (generalized): Secondary | ICD-10-CM | POA: Diagnosis not present

## 2019-03-13 DIAGNOSIS — M25562 Pain in left knee: Secondary | ICD-10-CM | POA: Diagnosis not present

## 2019-03-13 DIAGNOSIS — R601 Generalized edema: Secondary | ICD-10-CM | POA: Diagnosis not present

## 2019-03-14 DIAGNOSIS — M25562 Pain in left knee: Secondary | ICD-10-CM | POA: Diagnosis not present

## 2019-03-14 DIAGNOSIS — M25561 Pain in right knee: Secondary | ICD-10-CM | POA: Diagnosis not present

## 2019-03-14 DIAGNOSIS — M6281 Muscle weakness (generalized): Secondary | ICD-10-CM | POA: Diagnosis not present

## 2019-03-14 DIAGNOSIS — R601 Generalized edema: Secondary | ICD-10-CM | POA: Diagnosis not present

## 2019-03-15 DIAGNOSIS — M25562 Pain in left knee: Secondary | ICD-10-CM | POA: Diagnosis not present

## 2019-03-15 DIAGNOSIS — M25561 Pain in right knee: Secondary | ICD-10-CM | POA: Diagnosis not present

## 2019-03-15 DIAGNOSIS — M6281 Muscle weakness (generalized): Secondary | ICD-10-CM | POA: Diagnosis not present

## 2019-03-15 DIAGNOSIS — R601 Generalized edema: Secondary | ICD-10-CM | POA: Diagnosis not present

## 2019-03-20 DIAGNOSIS — M25562 Pain in left knee: Secondary | ICD-10-CM | POA: Diagnosis not present

## 2019-03-20 DIAGNOSIS — M25561 Pain in right knee: Secondary | ICD-10-CM | POA: Diagnosis not present

## 2019-03-20 DIAGNOSIS — R601 Generalized edema: Secondary | ICD-10-CM | POA: Diagnosis not present

## 2019-03-20 DIAGNOSIS — M6281 Muscle weakness (generalized): Secondary | ICD-10-CM | POA: Diagnosis not present

## 2019-03-22 DIAGNOSIS — M6281 Muscle weakness (generalized): Secondary | ICD-10-CM | POA: Diagnosis not present

## 2019-03-22 DIAGNOSIS — M25562 Pain in left knee: Secondary | ICD-10-CM | POA: Diagnosis not present

## 2019-03-22 DIAGNOSIS — R601 Generalized edema: Secondary | ICD-10-CM | POA: Diagnosis not present

## 2019-03-22 DIAGNOSIS — M25561 Pain in right knee: Secondary | ICD-10-CM | POA: Diagnosis not present

## 2019-03-25 DIAGNOSIS — R601 Generalized edema: Secondary | ICD-10-CM | POA: Diagnosis not present

## 2019-03-25 DIAGNOSIS — M25562 Pain in left knee: Secondary | ICD-10-CM | POA: Diagnosis not present

## 2019-03-25 DIAGNOSIS — M6281 Muscle weakness (generalized): Secondary | ICD-10-CM | POA: Diagnosis not present

## 2019-03-25 DIAGNOSIS — M25561 Pain in right knee: Secondary | ICD-10-CM | POA: Diagnosis not present

## 2019-03-27 DIAGNOSIS — M25562 Pain in left knee: Secondary | ICD-10-CM | POA: Diagnosis not present

## 2019-03-27 DIAGNOSIS — M25561 Pain in right knee: Secondary | ICD-10-CM | POA: Diagnosis not present

## 2019-03-27 DIAGNOSIS — R601 Generalized edema: Secondary | ICD-10-CM | POA: Diagnosis not present

## 2019-03-27 DIAGNOSIS — M6281 Muscle weakness (generalized): Secondary | ICD-10-CM | POA: Diagnosis not present

## 2019-03-29 DIAGNOSIS — M25561 Pain in right knee: Secondary | ICD-10-CM | POA: Diagnosis not present

## 2019-03-29 DIAGNOSIS — M6281 Muscle weakness (generalized): Secondary | ICD-10-CM | POA: Diagnosis not present

## 2019-03-29 DIAGNOSIS — R601 Generalized edema: Secondary | ICD-10-CM | POA: Diagnosis not present

## 2019-03-29 DIAGNOSIS — M25562 Pain in left knee: Secondary | ICD-10-CM | POA: Diagnosis not present

## 2019-04-01 DIAGNOSIS — M6281 Muscle weakness (generalized): Secondary | ICD-10-CM | POA: Diagnosis not present

## 2019-04-01 DIAGNOSIS — R601 Generalized edema: Secondary | ICD-10-CM | POA: Diagnosis not present

## 2019-04-01 DIAGNOSIS — M25562 Pain in left knee: Secondary | ICD-10-CM | POA: Diagnosis not present

## 2019-04-01 DIAGNOSIS — M25561 Pain in right knee: Secondary | ICD-10-CM | POA: Diagnosis not present

## 2019-04-07 NOTE — Progress Notes (Signed)
  Subjective:  Patient ID: Patrick Bishop, male    DOB: May 30, 1941,  MRN: [id number]  Chief Complaint  Patient presents with  . Nail Problem    (np) right great toe infection (Dr Ref Sadie Haber [facility] [place]    78 y.o. male presents with the above complaint. Reports right great toe ulceration with infection and states that he was performed to modify his PCP.  Reports chronic lymphedema at all times.   Review of Systems: Negative except as noted in the HPI. Denies N/V/F/Ch.  Past Medical History:  Diagnosis Date  . Chronic diastolic heart failure (HCC)    Normal LV Fxn by Echo 04/2017   . Chronic venous insufficiency 2015   s/p Bilateral Iliac & Femoral Stents (including IVC) - DEEP Venous Reflux.  Also s/p Bilateral GSV Ablation.  . Essential hypertension   . Hyperlipidemia with target LDL less than 100   . Longstanding persistent atrial fibrillation   . Obesity   . OSA on CPAP    May need New CPAP machine & CPAP MD)  . Pulmonary hypertension (HCC)     Current Outpatient Medications:  .  amLODipine (NORVASC) 10 MG tablet, TAKE 1 TABLET DAILY, Disp: 90 tablet, Rfl: 1 .  amoxicillin-clavulanate (AUGMENTIN) 875-125 MG tablet, , Disp: , Rfl:  .  atorvastatin (LIPITOR) 10 MG tablet, TAKE 1 TABLET DAILY AT 6 P.M., Disp: 90 tablet, Rfl: 2 .  furosemide (LASIX) 40 MG tablet, Take  20 mg  ( 1/2 tablet) twice a day except on Monday and Thursday take 40 mg twice a day., Disp: 180 tablet, Rfl: 3 .  HYDROcodone-acetaminophen (NORCO/VICODIN) 5-325 MG tablet, , Disp: , Rfl:  .  metoprolol tartrate (LOPRESSOR) 25 MG tablet, Take 1 tablet (25 mg total) by mouth every morning AND 1.5 tablets (37.5 mg total) every evening., Disp: 180 tablet, Rfl: 3 .  XARELTO 20 MG TABS tablet, TAKE 1 TABLET DAILY WITH SUPPER, Disp: 90 tablet, Rfl: 4  Social History   Tobacco Use  Smoking Status Former Smoker  . Packs/day: 1.00  . Years: 35.00  . Pack years: 35.00  . Types: Cigarettes  . Quit date: 28  . Years  since quitting: 24.5  Smokeless Tobacco Never Used  Tobacco Comment   smoked from age [age]     Allergies  Allergen Reactions  . Aldactone [Spironolactone]     - unknown    Objective:   Vitals:   07/12/18 1545  BP: 138/73  Pulse: 78   There is no height or weight on file to calculate BMI. Constitutional Well developed. Well nourished.  Vascular Dorsalis pedis pulses palpable bilaterally. Posterior tibial pulses palpable bilaterally. Lymphedema trophic skin changes bilaterally Capillary refill normal to all digits.  No cyanosis or clubbing noted. Pedal hair growth normal.  Neurologic Normal speech. Oriented to person, place, and time. Epicritic sensation to light touch grossly present bilaterally.  Dermatologic Nails normal Skin right hallux 3 x 2 cm ulceration  Orthopedic: Normal joint ROM without pain or crepitus bilaterally. No visible deformities. No bony tenderness.   Radiographs: None Assessment:   1. 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  Right hallux ulceration -Debrided about excess hyperkeratotic skin and subcutaneous tissue -Dressed with Silvadene and DSD  Lymphedema -Unna boot applied bilaterally  Return in about 3 weeks (around 08/02/2018) for Wound Care, Right.

## 2019-04-07 NOTE — Progress Notes (Signed)
Subjective:  Patient ID: Patrick Bishop, male    DOB: September 26, 1941,  MRN: [id number]  Chief Complaint  Patient presents with  . Wound Check    Right 1st toe wound check. Pt states has not noticed any signs of infection and has been keeping toe clean.  . Foot Problem    Bilateral swelling in feet. Pt states una boots have reduced swelling.     78 y.o. male presents with the above complaint. Hx as above.  Review of Systems: Negative except as noted in the HPI. Denies N/V/F/Ch.  Past Medical History:  Diagnosis Date  . Chronic diastolic heart failure (HCC)    Normal LV Fxn by Echo 04/2017   . Chronic venous insufficiency 2015   s/p Bilateral Iliac & Femoral Stents (including IVC) - DEEP Venous Reflux.  Also s/p Bilateral GSV Ablation.  . Essential hypertension   . Hyperlipidemia with target LDL less than 100   . Longstanding persistent atrial fibrillation   . Obesity   . OSA on CPAP    May need New CPAP machine & CPAP MD)  . Pulmonary hypertension (HCC)     Current Outpatient Medications:  .  amoxicillin-clavulanate (AUGMENTIN) 875-125 MG tablet, , Disp: , Rfl:  .  furosemide (LASIX) 40 MG tablet, Take  20 mg  ( 1/2 tablet) twice a day except on Monday and Thursday take 40 mg twice a day., Disp: 180 tablet, Rfl: 3 .  HYDROcodone-acetaminophen (NORCO/VICODIN) 5-325 MG tablet, , Disp: , Rfl:  .  metoprolol tartrate (LOPRESSOR) 25 MG tablet, Take 1 tablet (25 mg total) by mouth every morning AND 1.5 tablets (37.5 mg total) every evening., Disp: 180 tablet, Rfl: 3 .  amLODipine (NORVASC) 10 MG tablet, TAKE 1 TABLET DAILY, Disp: 90 tablet, Rfl: 1 .  atorvastatin (LIPITOR) 10 MG tablet, TAKE 1 TABLET DAILY AT 6 P.M., Disp: 90 tablet, Rfl: 2 .  XARELTO 20 MG TABS tablet, TAKE 1 TABLET DAILY WITH SUPPER, Disp: 90 tablet, Rfl: 4  Social History   Tobacco Use  Smoking Status Former Smoker  . Packs/day: 1.00  . Years: 35.00  . Pack years: 35.00  . Types: Cigarettes  . Quit date: 1   . Years since quitting: 24.5  Smokeless Tobacco Never Used  Tobacco Comment   smoked from age [age]     Allergies  Allergen Reactions  . Aldactone [Spironolactone]     - unknown    Objective:   There were no vitals filed for this visit. There is no height or weight on file to calculate BMI. Constitutional Well developed. Well nourished.  Vascular Dorsalis pedis pulses palpable bilaterally. Posterior tibial pulses palpable bilaterally. Lymphedema trophic skin changes bilaterally Capillary refill normal to all digits.  No cyanosis or clubbing noted. Pedal hair growth normal.  Neurologic Normal speech. Oriented to person, place, and time. Epicritic sensation to light touch grossly present bilaterally.  Dermatologic Nails normal Right hallux ulceration healing well  Orthopedic: Normal joint ROM without pain or crepitus bilaterally. No visible deformities. No bony tenderness.   Edema Circumference:  Left Right  Dorsum Foot 31 cm 32 cm  Ankle 35 cm 36 cm  Calf 55 cm 58 cm  Thigh 63 cm 68 cm   Treatment: Unna bilat  Radiographs: None Assessment:   1. 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 Lymphedema    Plan:  Patient was evaluated and treated and all questions answered.  Right hallux  ulceration -Improving, continue meta honey to the wound  Lymphedema -Unna boot applied bilaterally. HHC for unna changes -Measurements as above.  Return in about 4 weeks (around 08/31/2018) for Lymphedema bilat.

## 2019-04-07 NOTE — Progress Notes (Signed)
Subjective:  Patient ID: Patrick Bishop, male    DOB: 02/22/1941,  MRN: [id number]  Chief Complaint  Patient presents with  . Foot Ulcer    1 month follow up chronic right great toe ulcer  . Leg Swelling    follow up bilateral lymphadema     78 y.o. male presents with the above complaint. Hx as above.  Review of Systems: Negative except as noted in the HPI. Denies N/V/F/Ch.  Past Medical History:  Diagnosis Date  . Chronic diastolic heart failure (HCC)    Normal LV Fxn by Echo 04/2017   . Chronic venous insufficiency 2015   s/p Bilateral Iliac & Femoral Stents (including IVC) - DEEP Venous Reflux.  Also s/p Bilateral GSV Ablation.  . Essential hypertension   . Hyperlipidemia with target LDL less than 100   . Longstanding persistent atrial fibrillation   . Obesity   . OSA on CPAP    May need New CPAP machine & CPAP MD)  . Pulmonary hypertension (HCC)     Current Outpatient Medications:  .  amLODipine (NORVASC) 10 MG tablet, TAKE 1 TABLET DAILY, Disp: 90 tablet, Rfl: 1 .  amoxicillin-clavulanate (AUGMENTIN) 875-125 MG tablet, , Disp: , Rfl:  .  atorvastatin (LIPITOR) 10 MG tablet, TAKE 1 TABLET DAILY AT 6 P.M., Disp: 90 tablet, Rfl: 2 .  furosemide (LASIX) 40 MG tablet, Take  20 mg  ( 1/2 tablet) twice a day except on Monday and Thursday take 40 mg twice a day., Disp: 180 tablet, Rfl: 3 .  HYDROcodone-acetaminophen (NORCO/VICODIN) 5-325 MG tablet, , Disp: , Rfl:  .  metoprolol tartrate (LOPRESSOR) 25 MG tablet, Take 1 tablet (25 mg total) by mouth every morning AND 1.5 tablets (37.5 mg total) every evening., Disp: 180 tablet, Rfl: 3 .  XARELTO 20 MG TABS tablet, TAKE 1 TABLET DAILY WITH SUPPER, Disp: 90 tablet, Rfl: 4  Social History   Tobacco Use  Smoking Status Former Smoker  . Packs/day: 1.00  . Years: 35.00  . Pack years: 35.00  . Types: Cigarettes  . Quit date: 22  . Years since quitting: 24.5  Smokeless Tobacco Never Used  Tobacco Comment   smoked from age  [age]     Allergies  Allergen Reactions  . Aldactone [Spironolactone]     - unknown    Objective:   There were no vitals filed for this visit. There is no height or weight on file to calculate BMI. Constitutional Well developed. Well nourished.  Vascular Dorsalis pedis pulses palpable bilaterally. Posterior tibial pulses palpable bilaterally. Lymphedema trophic skin changes bilaterally Capillary refill normal to all digits.  No cyanosis or clubbing noted. Pedal hair growth normal.  Neurologic Normal speech. Oriented to person, place, and time. Epicritic sensation to light touch grossly present bilaterally.  Dermatologic Nails normal Right hallux ulceration healing well  Orthopedic: Normal joint ROM without pain or crepitus bilaterally. No visible deformities. No bony tenderness.   Edema Circumference:  Left Right  Dorsum Foot 31 cm 32 cm  Ankle 35 cm 36 cm  Calf 55 cm 58 cm  Thigh 63 cm 68 cm   Edema Circumference: 12/12 Left Right  Dorsum Foot 32 cm 34 cm  Ankle 38 cm 39 cm  Calf 58 cm 60 cm  Thigh 70 cm 70 cm   Treatment:Unna bilat  Radiographs: None Assessment:   1. 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 Lymphedema    Plan:  Patient was evaluated and treated and all questions answered.  Right hallux ulceration -Improving, continue meta honey to the wound  Lymphedema -Unna boot applied bilaterally. HHC for unna changes -Measurements as above. -Would benefit from lymphedema pumps.  Return in about 6 weeks (around 10/18/2018) for Lymphedema f/u.

## 2019-04-19 ENCOUNTER — Other Ambulatory Visit: Payer: Self-pay | Admitting: Cardiology

## 2019-05-28 DIAGNOSIS — N12 Tubulo-interstitial nephritis, not specified as acute or chronic: Secondary | ICD-10-CM

## 2019-05-28 DIAGNOSIS — N200 Calculus of kidney: Secondary | ICD-10-CM

## 2019-05-28 HISTORY — DX: Tubulo-interstitial nephritis, not specified as acute or chronic: N12

## 2019-05-28 HISTORY — DX: Calculus of kidney: N20.0

## 2019-05-28 HISTORY — PX: TRANSTHORACIC ECHOCARDIOGRAM: SHX275

## 2019-06-02 ENCOUNTER — Other Ambulatory Visit: Payer: Self-pay | Admitting: Cardiology

## 2019-06-24 ENCOUNTER — Encounter (HOSPITAL_COMMUNITY)
Admission: EM | Disposition: A | Discharge status: Skilled Nursing Facility | Payer: Self-pay | Source: Home / Self Care | Attending: Internal Medicine

## 2019-06-24 ENCOUNTER — Encounter (HOSPITAL_COMMUNITY): Payer: Self-pay | Admitting: Anesthesiology

## 2019-06-24 ENCOUNTER — Emergency Department (HOSPITAL_COMMUNITY): Payer: Medicare Other

## 2019-06-24 ENCOUNTER — Encounter (HOSPITAL_COMMUNITY): Payer: Self-pay | Admitting: Emergency Medicine

## 2019-06-24 ENCOUNTER — Other Ambulatory Visit: Payer: Self-pay

## 2019-06-24 ENCOUNTER — Inpatient Hospital Stay (HOSPITAL_COMMUNITY)
Admission: EM | Admit: 2019-06-24 | Discharge: 2019-07-02 | DRG: 871 | Disposition: A | Discharge status: Skilled Nursing Facility | Payer: Medicare Other | Attending: Internal Medicine | Admitting: Internal Medicine

## 2019-06-24 DIAGNOSIS — E872 Acidosis: Secondary | ICD-10-CM | POA: Diagnosis not present

## 2019-06-24 DIAGNOSIS — Z4659 Encounter for fitting and adjustment of other gastrointestinal appliance and device: Secondary | ICD-10-CM

## 2019-06-24 DIAGNOSIS — J9601 Acute respiratory failure with hypoxia: Secondary | ICD-10-CM | POA: Diagnosis present

## 2019-06-24 DIAGNOSIS — N138 Other obstructive and reflux uropathy: Secondary | ICD-10-CM

## 2019-06-24 DIAGNOSIS — N3001 Acute cystitis with hematuria: Secondary | ICD-10-CM

## 2019-06-24 DIAGNOSIS — R6521 Severe sepsis with septic shock: Secondary | ICD-10-CM | POA: Diagnosis present

## 2019-06-24 DIAGNOSIS — I872 Venous insufficiency (chronic) (peripheral): Secondary | ICD-10-CM | POA: Diagnosis present

## 2019-06-24 DIAGNOSIS — Z9989 Dependence on other enabling machines and devices: Secondary | ICD-10-CM | POA: Diagnosis not present

## 2019-06-24 DIAGNOSIS — R1084 Generalized abdominal pain: Secondary | ICD-10-CM | POA: Diagnosis not present

## 2019-06-24 DIAGNOSIS — E785 Hyperlipidemia, unspecified: Secondary | ICD-10-CM | POA: Diagnosis present

## 2019-06-24 DIAGNOSIS — N1330 Unspecified hydronephrosis: Secondary | ICD-10-CM | POA: Diagnosis not present

## 2019-06-24 DIAGNOSIS — N136 Pyonephrosis: Secondary | ICD-10-CM | POA: Diagnosis present

## 2019-06-24 DIAGNOSIS — G4733 Obstructive sleep apnea (adult) (pediatric): Secondary | ICD-10-CM | POA: Diagnosis present

## 2019-06-24 DIAGNOSIS — I739 Peripheral vascular disease, unspecified: Secondary | ICD-10-CM | POA: Diagnosis present

## 2019-06-24 DIAGNOSIS — K922 Gastrointestinal hemorrhage, unspecified: Secondary | ICD-10-CM | POA: Diagnosis not present

## 2019-06-24 DIAGNOSIS — Z7401 Bed confinement status: Secondary | ICD-10-CM | POA: Diagnosis not present

## 2019-06-24 DIAGNOSIS — R0902 Hypoxemia: Secondary | ICD-10-CM | POA: Diagnosis not present

## 2019-06-24 DIAGNOSIS — R6520 Severe sepsis without septic shock: Secondary | ICD-10-CM | POA: Diagnosis not present

## 2019-06-24 DIAGNOSIS — E669 Obesity, unspecified: Secondary | ICD-10-CM | POA: Diagnosis present

## 2019-06-24 DIAGNOSIS — J9811 Atelectasis: Secondary | ICD-10-CM | POA: Diagnosis not present

## 2019-06-24 DIAGNOSIS — Z6830 Body mass index (BMI) 30.0-30.9, adult: Secondary | ICD-10-CM

## 2019-06-24 DIAGNOSIS — I2510 Atherosclerotic heart disease of native coronary artery without angina pectoris: Secondary | ICD-10-CM | POA: Diagnosis present

## 2019-06-24 DIAGNOSIS — N200 Calculus of kidney: Secondary | ICD-10-CM

## 2019-06-24 DIAGNOSIS — Z20828 Contact with and (suspected) exposure to other viral communicable diseases: Secondary | ICD-10-CM | POA: Diagnosis present

## 2019-06-24 DIAGNOSIS — I871 Compression of vein: Secondary | ICD-10-CM | POA: Diagnosis not present

## 2019-06-24 DIAGNOSIS — N179 Acute kidney failure, unspecified: Secondary | ICD-10-CM

## 2019-06-24 DIAGNOSIS — K8020 Calculus of gallbladder without cholecystitis without obstruction: Secondary | ICD-10-CM | POA: Diagnosis present

## 2019-06-24 DIAGNOSIS — R710 Precipitous drop in hematocrit: Secondary | ICD-10-CM

## 2019-06-24 DIAGNOSIS — R0602 Shortness of breath: Secondary | ICD-10-CM | POA: Diagnosis not present

## 2019-06-24 DIAGNOSIS — E876 Hypokalemia: Secondary | ICD-10-CM | POA: Diagnosis present

## 2019-06-24 DIAGNOSIS — N390 Urinary tract infection, site not specified: Secondary | ICD-10-CM | POA: Diagnosis not present

## 2019-06-24 DIAGNOSIS — Z23 Encounter for immunization: Secondary | ICD-10-CM | POA: Diagnosis not present

## 2019-06-24 DIAGNOSIS — K760 Fatty (change of) liver, not elsewhere classified: Secondary | ICD-10-CM | POA: Diagnosis present

## 2019-06-24 DIAGNOSIS — A419 Sepsis, unspecified organism: Secondary | ICD-10-CM | POA: Diagnosis present

## 2019-06-24 DIAGNOSIS — N132 Hydronephrosis with renal and ureteral calculous obstruction: Secondary | ICD-10-CM | POA: Diagnosis present

## 2019-06-24 DIAGNOSIS — B964 Proteus (mirabilis) (morganii) as the cause of diseases classified elsewhere: Secondary | ICD-10-CM | POA: Diagnosis not present

## 2019-06-24 DIAGNOSIS — I272 Pulmonary hypertension, unspecified: Secondary | ICD-10-CM | POA: Diagnosis present

## 2019-06-24 DIAGNOSIS — R918 Other nonspecific abnormal finding of lung field: Secondary | ICD-10-CM | POA: Diagnosis present

## 2019-06-24 DIAGNOSIS — I4891 Unspecified atrial fibrillation: Secondary | ICD-10-CM | POA: Diagnosis not present

## 2019-06-24 DIAGNOSIS — A4159 Other Gram-negative sepsis: Principal | ICD-10-CM | POA: Diagnosis present

## 2019-06-24 DIAGNOSIS — J449 Chronic obstructive pulmonary disease, unspecified: Secondary | ICD-10-CM | POA: Diagnosis not present

## 2019-06-24 DIAGNOSIS — I110 Hypertensive heart disease with heart failure: Secondary | ICD-10-CM | POA: Diagnosis present

## 2019-06-24 DIAGNOSIS — Z8249 Family history of ischemic heart disease and other diseases of the circulatory system: Secondary | ICD-10-CM

## 2019-06-24 DIAGNOSIS — M6281 Muscle weakness (generalized): Secondary | ICD-10-CM | POA: Diagnosis not present

## 2019-06-24 DIAGNOSIS — R1032 Left lower quadrant pain: Secondary | ICD-10-CM | POA: Diagnosis not present

## 2019-06-24 DIAGNOSIS — I10 Essential (primary) hypertension: Secondary | ICD-10-CM | POA: Diagnosis not present

## 2019-06-24 DIAGNOSIS — D649 Anemia, unspecified: Secondary | ICD-10-CM | POA: Diagnosis not present

## 2019-06-24 DIAGNOSIS — Z95820 Peripheral vascular angioplasty status with implants and grafts: Secondary | ICD-10-CM

## 2019-06-24 DIAGNOSIS — K920 Hematemesis: Secondary | ICD-10-CM

## 2019-06-24 DIAGNOSIS — I4821 Permanent atrial fibrillation: Secondary | ICD-10-CM | POA: Diagnosis present

## 2019-06-24 DIAGNOSIS — R195 Other fecal abnormalities: Secondary | ICD-10-CM

## 2019-06-24 DIAGNOSIS — N201 Calculus of ureter: Secondary | ICD-10-CM

## 2019-06-24 DIAGNOSIS — I4811 Longstanding persistent atrial fibrillation: Secondary | ICD-10-CM | POA: Diagnosis present

## 2019-06-24 DIAGNOSIS — R778 Other specified abnormalities of plasma proteins: Secondary | ICD-10-CM | POA: Diagnosis not present

## 2019-06-24 DIAGNOSIS — I5032 Chronic diastolic (congestive) heart failure: Secondary | ICD-10-CM | POA: Diagnosis not present

## 2019-06-24 DIAGNOSIS — Z7901 Long term (current) use of anticoagulants: Secondary | ICD-10-CM

## 2019-06-24 DIAGNOSIS — Z4682 Encounter for fitting and adjustment of non-vascular catheter: Secondary | ICD-10-CM | POA: Diagnosis not present

## 2019-06-24 DIAGNOSIS — N135 Crossing vessel and stricture of ureter without hydronephrosis: Secondary | ICD-10-CM | POA: Diagnosis not present

## 2019-06-24 DIAGNOSIS — R112 Nausea with vomiting, unspecified: Secondary | ICD-10-CM | POA: Diagnosis not present

## 2019-06-24 DIAGNOSIS — I890 Lymphedema, not elsewhere classified: Secondary | ICD-10-CM | POA: Diagnosis present

## 2019-06-24 DIAGNOSIS — Z888 Allergy status to other drugs, medicaments and biological substances status: Secondary | ICD-10-CM

## 2019-06-24 DIAGNOSIS — I5033 Acute on chronic diastolic (congestive) heart failure: Secondary | ICD-10-CM | POA: Diagnosis not present

## 2019-06-24 DIAGNOSIS — Z86010 Personal history of colonic polyps: Secondary | ICD-10-CM

## 2019-06-24 DIAGNOSIS — R0689 Other abnormalities of breathing: Secondary | ICD-10-CM | POA: Diagnosis not present

## 2019-06-24 DIAGNOSIS — M2550 Pain in unspecified joint: Secondary | ICD-10-CM | POA: Diagnosis not present

## 2019-06-24 DIAGNOSIS — R52 Pain, unspecified: Secondary | ICD-10-CM | POA: Diagnosis not present

## 2019-06-24 DIAGNOSIS — Z79891 Long term (current) use of opiate analgesic: Secondary | ICD-10-CM

## 2019-06-24 DIAGNOSIS — R5381 Other malaise: Secondary | ICD-10-CM | POA: Diagnosis not present

## 2019-06-24 DIAGNOSIS — E663 Overweight: Secondary | ICD-10-CM | POA: Diagnosis not present

## 2019-06-24 DIAGNOSIS — Z79899 Other long term (current) drug therapy: Secondary | ICD-10-CM

## 2019-06-24 DIAGNOSIS — Z87891 Personal history of nicotine dependence: Secondary | ICD-10-CM

## 2019-06-24 HISTORY — DX: Personal history of adenomatous and serrated colon polyps: Z86.0101

## 2019-06-24 HISTORY — DX: Personal history of colonic polyps: Z86.010

## 2019-06-24 HISTORY — PX: IR NEPHROSTOMY PLACEMENT LEFT: IMG6063

## 2019-06-24 LAB — COMPREHENSIVE METABOLIC PANEL
ALT: 19 U/L (ref 0–44)
AST: 41 U/L (ref 15–41)
Albumin: 3.4 g/dL — ABNORMAL LOW (ref 3.5–5.0)
Alkaline Phosphatase: 59 U/L (ref 38–126)
Anion gap: 14 (ref 5–15)
BUN: 28 mg/dL — ABNORMAL HIGH (ref 8–23)
CO2: 17 mmol/L — ABNORMAL LOW (ref 22–32)
Calcium: 8.6 mg/dL — ABNORMAL LOW (ref 8.9–10.3)
Chloride: 110 mmol/L (ref 98–111)
Creatinine, Ser: 2.26 mg/dL — ABNORMAL HIGH (ref 0.61–1.24)
GFR calc Af Amer: 31 mL/min — ABNORMAL LOW (ref >60)
GFR calc non Af Amer: 27 mL/min — ABNORMAL LOW (ref >60)
Glucose, Bld: 122 mg/dL — ABNORMAL HIGH (ref 70–99)
Potassium: 3.1 mmol/L — ABNORMAL LOW (ref 3.5–5.1)
Sodium: 141 mmol/L (ref 135–145)
Total Bilirubin: 2 mg/dL — ABNORMAL HIGH (ref 0.3–1.2)
Total Protein: 6.7 g/dL (ref 6.5–8.1)

## 2019-06-24 LAB — CBC WITH DIFFERENTIAL/PLATELET
Abs Immature Granulocytes: 0.08 10*3/uL — ABNORMAL HIGH (ref 0.00–0.07)
Basophils Absolute: 0 10*3/uL (ref 0.0–0.1)
Basophils Relative: 0 %
Eosinophils Absolute: 0 10*3/uL (ref 0.0–0.5)
Eosinophils Relative: 0 %
HCT: 39.6 % (ref 39.0–52.0)
Hemoglobin: 13 g/dL (ref 13.0–17.0)
Immature Granulocytes: 1 %
Lymphocytes Relative: 2 %
Lymphs Abs: 0.2 10*3/uL — ABNORMAL LOW (ref 0.7–4.0)
MCH: 31.2 pg (ref 26.0–34.0)
MCHC: 32.8 g/dL (ref 30.0–36.0)
MCV: 95 fL (ref 80.0–100.0)
Monocytes Absolute: 0.6 10*3/uL (ref 0.1–1.0)
Monocytes Relative: 6 %
Neutro Abs: 8.5 10*3/uL — ABNORMAL HIGH (ref 1.7–7.7)
Neutrophils Relative %: 91 %
Platelets: 149 10*3/uL — ABNORMAL LOW (ref 150–400)
RBC: 4.17 MIL/uL — ABNORMAL LOW (ref 4.22–5.81)
RDW: 13.9 % (ref 11.5–15.5)
WBC Morphology: INCREASED
WBC: 9.4 10*3/uL (ref 4.0–10.5)
nRBC: 0 % (ref 0.0–0.2)

## 2019-06-24 LAB — URINALYSIS, ROUTINE W REFLEX MICROSCOPIC
Bilirubin Urine: NEGATIVE
Glucose, UA: NEGATIVE mg/dL
Ketones, ur: NEGATIVE mg/dL
Nitrite: POSITIVE — AB
Protein, ur: 30 mg/dL — AB
Specific Gravity, Urine: 1.016 (ref 1.005–1.030)
pH: 5 (ref 5.0–8.0)

## 2019-06-24 LAB — PROTIME-INR
INR: 2.2 — ABNORMAL HIGH (ref 0.8–1.2)
Prothrombin Time: 24.4 seconds — ABNORMAL HIGH (ref 11.4–15.2)

## 2019-06-24 LAB — LACTIC ACID, PLASMA
Lactic Acid, Venous: 5.6 mmol/L (ref 0.5–1.9)
Lactic Acid, Venous: 3 mmol/L (ref 0.5–1.9)
Lactic Acid, Venous: 2.4 mmol/L (ref 0.5–1.9)
Lactic Acid, Venous: 2.5 mmol/L (ref 0.5–1.9)

## 2019-06-24 LAB — SARS CORONAVIRUS 2 BY RT PCR (HOSPITAL ORDER, PERFORMED IN ~~LOC~~ HOSPITAL LAB): SARS Coronavirus 2: NEGATIVE

## 2019-06-24 LAB — TROPONIN I (HIGH SENSITIVITY)
Troponin I (High Sensitivity): 371 ng/L (ref <18)
Troponin I (High Sensitivity): 496 ng/L (ref <18)

## 2019-06-24 LAB — PROCALCITONIN: Procalcitonin: 57.08 ng/mL

## 2019-06-24 LAB — LIPASE, BLOOD: Lipase: 20 U/L (ref 11–51)

## 2019-06-24 SURGERY — CYSTOSCOPY, WITH RETROGRADE PYELOGRAM AND URETERAL STENT INSERTION
Anesthesia: General | Laterality: Left

## 2019-06-24 MED ORDER — SODIUM CHLORIDE 0.9 % IV SOLN
2.0000 g | Freq: Once | INTRAVENOUS | Status: AC
  Administered 2019-06-24: 2 g via INTRAVENOUS
  Filled 2019-06-24: qty 2

## 2019-06-24 MED ORDER — SODIUM CHLORIDE 0.9 % IV BOLUS
1000.0000 mL | Freq: Once | INTRAVENOUS | Status: AC
  Administered 2019-06-24: 13:00:00 1000 mL via INTRAVENOUS

## 2019-06-24 MED ORDER — MORPHINE SULFATE (PF) 4 MG/ML IV SOLN
4.0000 mg | Freq: Once | INTRAVENOUS | Status: AC
  Administered 2019-06-24: 12:00:00 4 mg via INTRAVENOUS
  Filled 2019-06-24: qty 1

## 2019-06-24 MED ORDER — CIPROFLOXACIN IN D5W 400 MG/200ML IV SOLN
INTRAVENOUS | Status: AC
  Filled 2019-06-24: qty 200

## 2019-06-24 MED ORDER — CIPROFLOXACIN IN D5W 400 MG/200ML IV SOLN
400.0000 mg | Freq: Two times a day (BID) | INTRAVENOUS | Status: DC
  Administered 2019-06-24 – 2019-06-25 (×2): 400 mg via INTRAVENOUS
  Filled 2019-06-24: qty 200

## 2019-06-24 MED ORDER — IBUPROFEN 800 MG PO TABS
800.0000 mg | ORAL_TABLET | Freq: Once | ORAL | Status: AC
  Administered 2019-06-24: 13:00:00 800 mg via ORAL
  Filled 2019-06-24: qty 1

## 2019-06-24 MED ORDER — FENTANYL CITRATE (PF) 100 MCG/2ML IJ SOLN
INTRAMUSCULAR | Status: AC
  Filled 2019-06-24: qty 2

## 2019-06-24 MED ORDER — METOPROLOL TARTRATE 5 MG/5ML IV SOLN
5.0000 mg | Freq: Once | INTRAVENOUS | Status: AC
  Administered 2019-06-24: 5 mg via INTRAVENOUS
  Filled 2019-06-24: qty 5

## 2019-06-24 MED ORDER — SODIUM CHLORIDE 0.9 % IV BOLUS
1000.0000 mL | Freq: Once | INTRAVENOUS | Status: AC
  Administered 2019-06-24: 1000 mL via INTRAVENOUS

## 2019-06-24 MED ORDER — SODIUM CHLORIDE 0.9 % IV SOLN
2.0000 g | Freq: Two times a day (BID) | INTRAVENOUS | Status: DC
  Filled 2019-06-24 (×2): qty 2

## 2019-06-24 MED ORDER — POTASSIUM CHLORIDE 10 MEQ/100ML IV SOLN
10.0000 meq | INTRAVENOUS | Status: AC
  Administered 2019-06-24 (×4): 10 meq via INTRAVENOUS
  Filled 2019-06-24 (×4): qty 100

## 2019-06-24 MED ORDER — ACETAMINOPHEN 500 MG PO TABS
1000.0000 mg | ORAL_TABLET | Freq: Once | ORAL | Status: AC
  Administered 2019-06-24: 12:00:00 1000 mg via ORAL
  Filled 2019-06-24: qty 2

## 2019-06-24 MED ORDER — MAGNESIUM SULFATE 2 GM/50ML IV SOLN
2.0000 g | Freq: Once | INTRAVENOUS | Status: AC
  Administered 2019-06-24: 2 g via INTRAVENOUS
  Filled 2019-06-24: qty 50

## 2019-06-24 MED ORDER — SODIUM CHLORIDE 0.9 % IV SOLN
INTRAVENOUS | Status: DC
  Administered 2019-06-24: 19:00:00 via INTRAVENOUS

## 2019-06-24 MED ORDER — POLYETHYLENE GLYCOL 3350 17 G PO PACK
17.0000 g | PACK | Freq: Every day | ORAL | Status: DC | PRN
  Administered 2019-06-30: 17 g via ORAL
  Filled 2019-06-24 (×2): qty 1

## 2019-06-24 MED ORDER — ONDANSETRON HCL 4 MG PO TABS: 4.0000 mg | ORAL_TABLET | Freq: Four times a day (QID) | ORAL | Status: DC | PRN

## 2019-06-24 MED ORDER — ONDANSETRON HCL 4 MG/2ML IJ SOLN
4.0000 mg | Freq: Once | INTRAMUSCULAR | Status: AC
  Administered 2019-06-24: 4 mg via INTRAVENOUS
  Filled 2019-06-24: qty 2

## 2019-06-24 MED ORDER — ACETAMINOPHEN 325 MG PO TABS: 650.0000 mg | ORAL_TABLET | Freq: Four times a day (QID) | ORAL | Status: DC | PRN

## 2019-06-24 MED ORDER — ATORVASTATIN CALCIUM 10 MG PO TABS
10.0000 mg | ORAL_TABLET | Freq: Every day | ORAL | Status: DC
  Administered 2019-06-25 – 2019-07-02 (×8): 10 mg via ORAL
  Filled 2019-06-24 (×8): qty 1

## 2019-06-24 MED ORDER — LIDOCAINE HCL 1 % IJ SOLN
INTRAMUSCULAR | Status: AC
  Filled 2019-06-24: qty 20

## 2019-06-24 MED ORDER — SODIUM CHLORIDE 0.9 % IV SOLN
1.0000 g | INTRAVENOUS | Status: DC
  Administered 2019-06-24: 14:00:00 1 g via INTRAVENOUS
  Filled 2019-06-24: qty 10

## 2019-06-24 MED ORDER — ONDANSETRON HCL 4 MG/2ML IJ SOLN
4.0000 mg | Freq: Four times a day (QID) | INTRAMUSCULAR | Status: DC | PRN
  Administered 2019-06-25 – 2019-06-26 (×2): 4 mg via INTRAVENOUS
  Filled 2019-06-24 (×2): qty 2

## 2019-06-24 MED ORDER — ACETAMINOPHEN 650 MG RE SUPP: 650.0000 mg | Freq: Four times a day (QID) | RECTAL | Status: DC | PRN

## 2019-06-24 MED ORDER — SODIUM CHLORIDE 0.9% FLUSH
3.0000 mL | Freq: Two times a day (BID) | INTRAVENOUS | Status: DC
  Administered 2019-06-25 – 2019-07-02 (×13): 3 mL via INTRAVENOUS

## 2019-06-24 MED ORDER — SODIUM CHLORIDE 0.9 % IV BOLUS
500.0000 mL | Freq: Once | INTRAVENOUS | Status: AC
  Administered 2019-06-24: 23:00:00 500 mL via INTRAVENOUS

## 2019-06-24 MED ORDER — IOHEXOL 300 MG/ML  SOLN
50.0000 mL | Freq: Once | INTRAMUSCULAR | Status: AC | PRN
  Administered 2019-06-24: 5 mL

## 2019-06-24 MED ORDER — SODIUM CHLORIDE 0.9 % IV BOLUS
1000.0000 mL | Freq: Once | INTRAVENOUS | Status: AC
  Administered 2019-06-24: 15:00:00 1000 mL via INTRAVENOUS

## 2019-06-24 NOTE — Consult Note (Signed)
Chief Complaint: Patient was seen in consultation today for urosepsis  Referring Physician(s): Dr. Lovena Neighbours  Supervising Physician: Corrie Mckusick  Patient Status: [facility] - ED  History of Present Illness: Patrick Bishop is a 78 y.o. male with past medical history of CHF, venous insufficiency, persistent atrial fibrillation on Xarelto at home, HTN who presented from his independent living facility with abdominal pain, nausea, vomiting, and tachycardia.  His O2 sats were 88% on room air.  Now on nasal cannula.   CT Abdomen Pelvis showed: 1. 1 cm calculus at the left ureteropelvic junction with mild to moderate proximal left hydronephrosis and extensive perinephric stranding indicative of obstruction. 2. No acute findings noted in the thorax. 3. 2 additional nonobstructive calculi in the lower pole collecting system of left kidney measuring up to 4 mm. 4. Aortic atherosclerosis, in addition to left main and 3 vessel coronary artery disease. Assessment for potential risk factor modification, dietary therapy or pharmacologic therapy may be warranted, if clinically indicated. 5. Probable cholelithiasis without evidence of acute cholecystitis at this time. 6. Mild cardiomegaly. 7. 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  Urology has evaluated the patient, however with increased cardiac risk with surgery.  IR consulted for percutaneous nephrostomy tube placement.  Case reviewed and approved by Dr. Earleen Newport.  Past Medical History:  Diagnosis Date   Chronic diastolic heart failure ([place])    Normal LV Fxn by Echo 04/2017    Chronic venous insufficiency 2015   s/p Bilateral Iliac & Femoral Stents (including IVC) - DEEP Venous Reflux.  Also s/p Bilateral GSV Ablation.   Essential hypertension    Hyperlipidemia with target LDL less than 100    Longstanding persistent atrial fibrillation    Obesity    OSA on CPAP    May need New CPAP machine  & CPAP MD)   Pulmonary hypertension [facility])     Past Surgical History:  Procedure Laterality Date   ABDOMINAL VENOUS DUPLEX Bilateral 07/2014   ([place] Cardiology: Dr. Cleda Mccreedy): (prior to Bilateral Iliac-Femoral Venous Stent Placement): Patent IVC.  No DVT B/L Iliac or Com fem V..  +++ DEEP V REFLUX B/L ILIAC & FEM V.  Absent Superficial V Reflux B/L x L SSV (too tortuous for intervention).  BL GSV occluded - prior ablation.    ABDOMINAL VENOUS DUPLEX Bilateral 11/2014   Absent DVT.  Left common femoral venous reflux: 1508 MS left common femoral vein, 2284MS left proximal femoral vein -> cystic structure noted in the left popliteal vein (7.6 x 1.4 x 3.8 cm).;  Right leg deep venous reflux noted in the distal iliac, common femoral, proximal profunda femoral, proximal superficial femoral, superficial femoral and popliteal vein.   CARDIAC CATHETERIZATION  10/2009   [facility] Cardiology - Dr. 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  EF~45%.  Mild pulmonary pretension.   CARDIAC PET  08/2009   Inferolateral partially reversible defect -- > false positive by cath   [place] Bilateral Before 2015   [place] [facility] (Dr. Eugenie Filler)   IR [facility]  EA ADD VEIN  INC ANGIOPLASTY Left 10/2014   [id number], Dr. 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 Stormy Fabian): US Guided --> R Common & External Iliac, Common Femoral Venography with Placement of 24 mmx 70 mm Stent - R Com Iliac-Ext Iliac- & Com Fem V. reducing 60-80% venous compression to ~0%).;; also noted significant L-sided venous compression (staged stenting).    NM  MYOVIEW LTD  11/2005   Adenosine Myoview [facility] Cardiology - Dr. Cleda Mccreedy): Normal myocardial perfusion scan. No ischemia or infarction.   TRANSTHORACIC ECHOCARDIOGRAM  04/26/2017    [facility] Cardiology - Dr. Cleda Mccreedy) - report not available.  Per clinic note: Normal global LV function. Mild concentric temperature. Mild TR. Mild pulmonary hypertension.;;    TRANSTHORACIC ECHOCARDIOGRAM  7/'15; 7/'17   [facility] - Dr. Saverio Danker) a) low normal LV function (EF 56%). Moderate concentric LVH. Mild LA dilation. Normal pulmonary pressures.  ;; b) normal global function - EF 60-65%. Moderate pulmonary hypertension. Biatrial enlargement. Moderate concentric LVH.   TRANSTHORACIC ECHOCARDIOGRAM  02/2010   [facility] Cardiology - Dr. Saverio Danker): Normal global LV function. Mild LVH. EF 60%.; November 2010. Moderate LV dilation. Moderate LVH. Normal EF 50-55%. Mild to moderate pulmonary hypertension. (July 2012 showed normal function with mild LVH, mild MR noted in August 2014)    Allergies: Aldactone [spironolactone]  Medications: Prior to Admission medications   Medication Sig Start Date End Date Taking? Authorizing Provider  amLODipine (NORVASC) 10 MG tablet TAKE 1 TABLET DAILY Patient taking differently: Take 10 mg by mouth daily.  06/04/19  Yes [name], MD  atorvastatin (LIPITOR) 10 MG tablet TAKE 1 TABLET DAILY AT 6 P.M. Patient taking differently: Take 10 mg by mouth daily.  11/30/18  Yes [name], MD  furosemide (LASIX) 40 MG tablet Take  20 mg  ( 1/2 tablet) twice a day except on Monday and Thursday take 40 mg twice a day. Patient taking differently: Take 20-40 mg by mouth See admin instructions. Take  20 mg  ( 1/2 tablet) twice a day except on Monday and Thursday take 40 mg twice a day. 07/02/18  Yes [name], MD  HYDROcodone-acetaminophen (NORCO/VICODIN) 5-325 MG tablet Take 1 tablet by mouth 2 (two) times daily.  09/04/17  Yes [provider]    metoprolol tartrate (LOPRESSOR) 25 MG tablet TAKE ONE TABLET (25 MG TOTAL) EVERY MORNING AND ONE AND ONE-HALF TABLETS (37.5 MG TOTAL) EVERY EVENING Patient taking differently: Take 25-37.5 mg by mouth See admin instructions. Take 25mg  in the morning and 37.5mg  in the evening. 04/19/19  Yes [name], MD  XARELTO 20 MG TABS tablet TAKE 1 TABLET DAILY WITH SUPPER Patient taking differently: Take 20 mg by mouth daily with supper.  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)   Occupation: Lobbyist: [employer]    Comment: followed by several odd jobs   Occupation: Retired    Comment: After several odd jobs  Scientist, product/process development strain: Not on Training and development officer insecurity    Worry: Not on file    Inability: Not on Lexicographer needs    Medical: Not on file    Non-medical: Not on file  Tobacco Use   Smoking status: Former Smoker    Packs/day: 1.00    Years: 35.00    Pack years: 35.00    Types: Cigarettes    Quit date: 1996    Years since quitting: 24.7   Smokeless tobacco: Never Used   Tobacco comment: smoked from age [age]   Substance and Sexual Activity   Alcohol use: No    Frequency: Never    Comment: Not any more   Drug use: No   Sexual activity: Not Currently  Lifestyle   Physical activity    Days per week: Not on file    Minutes per session: Not on file    Stress: Not on file  Relationships   Social connections    Talks on phone: Not on file    Gets together: Not on file    Attends religious service: Not on file    Active member of club or organization: Not on file    Attends meetings of clubs or organizations: Not on file    Relationship status: Not on file  Other Topics Concern   Not on file  Social History Narrative   Drayden recently moved to [place] in January 2019.  This was to be close to his daughter who is here with him today.  He has 2 daughters and one grandchild.      Jacqualine Code,  [place] --PCP was Sherlynn Carbon, MD.  Cardiologist: Saverio Danker, MD (who followed him every 3 months with routine visits.  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   He currently lives at [place] [facility] (Dr. Albesa Seen) --however he hopes to be moving into his daughter's house once they have not established and ready for him.     Review of Systems: A 12 point ROS discussed and pertinent positives are indicated in the HPI above.  All other systems are negative.  Review of Systems  Constitutional: Positive for fever. Negative for fatigue.  Respiratory: Negative for cough and shortness of breath.   Cardiovascular: Negative for chest pain.  Gastrointestinal: Negative for abdominal pain, diarrhea, nausea and vomiting.  Genitourinary: Negative for dysuria and flank pain.  Musculoskeletal: Positive for back pain (no more than usual).  Psychiatric/Behavioral: Negative for behavioral problems and confusion.    Vital Signs: BP (!) 89/47    Pulse 76    Temp 100.3 F (37.9 C) (Oral)    Resp (!) 28    Ht 6\' 1"  (1.854 m)    SpO2 95%    BMI 48.82 kg/m   Physical Exam Vitals signs and nursing note reviewed.  Constitutional:      Appearance: He is well-developed. He is ill-appearing.  HENT:     Head: Normocephalic.  Cardiovascular:      Rate and Rhythm: Normal rate. Rhythm irregular.  Pulmonary:     Effort: Pulmonary effort is normal. No respiratory distress.     Breath sounds: Normal breath sounds.  Abdominal:     General: Abdomen is flat.     Palpations: Abdomen is soft.     Tenderness: There is no abdominal tenderness.  Skin:    General: Skin is warm and dry.  Neurological:     General: No focal deficit present.     Mental Status: He is alert. He is disoriented.  Psychiatric:        Mood and Affect: Mood normal.        Behavior: Behavior normal.      MD Evaluation Airway: WNL Heart: WNL Abdomen: WNL Chest/ Lungs: WNL ASA  Classification: 3 Mallampati/Airway Score: Three   Imaging: Ct Abdomen Pelvis Wo Contrast  Result Date: 06/24/2019 CLINICAL DATA:  78 year old male with recent history of abdominal pain. EXAM: CT CHEST, ABDOMEN AND PELVIS WITHOUT CONTRAST TECHNIQUE: Multidetector CT imaging of the chest, abdomen and pelvis was performed following the standard protocol without IV contrast. COMPARISON:  No priors. FINDINGS: CT CHEST FINDINGS Cardiovascular: Heart size is mildly enlarged. There is no significant pericardial fluid, thickening or pericardial calcification. There is aortic atherosclerosis, as well as atherosclerosis of the great vessels of the mediastinum and the coronary arteries, including calcified atherosclerotic plaque in the left main, left anterior descending, left circumflex and right coronary arteries. Mediastinum/Nodes: No pathologically enlarged mediastinal or hilar lymph nodes. Please note that accurate exclusion of hilar adenopathy is limited on noncontrast CT scans. Esophagus is unremarkable in appearance. No axillary lymphadenopathy. Lungs/Pleura: Areas of scarring or subsegmental atelectasis in the lower lobes of the lungs bilaterally. A few scattered small pulmonary nodules are noted in the lungs, largest of which is in the right middle lobe (axial image 84 of series 4) measuring  5 mm. No other larger more suspicious appearing pulmonary nodules or masses are noted. No acute consolidative airspace disease. No pleural effusions. Musculoskeletal: Large fatty attenuation lesion in the left chest wall extending into the left axillary region, incompletely imaged, but measuring at least 22.5 x 14.8 cm. There are no aggressive appearing lytic or blastic lesions noted in the visualized portions of the skeleton. CT ABDOMEN PELVIS FINDINGS Hepatobiliary: Diffuse low attenuation throughout the hepatic parenchyma, indicative of hepatic steatosis. No definite suspicious cystic or solid hepatic lesions are confidently identified on today's noncontrast CT examination. Intermediate attenuation lying dependently in the gallbladder, likely to represent tiny noncalcified gallstones. No findings to suggest an acute cholecystitis at this time. Pancreas: No pancreatic mass. No pancreatic ductal dilatation. No pancreatic or peripancreatic fluid collections or inflammatory changes. Spleen: Unremarkable. Adrenals/Urinary Tract: Nonobstructive calculi in the lower pole collecting system of left kidney measuring up to 4 mm. In addition, there is a 1 cm calculus at the left ureteropelvic junction associated with mild-to-moderate proximal hydronephrosis and extensive perinephric stranding indicating obstruction at this time. No additional calculi are noted in the collecting system of the right kidney, along the course of the right ureter or within the lumen of the urinary bladder. No right hydroureteronephrosis. Unenhanced appearance of the kidneys is otherwise unremarkable. Urinary bladder is nearly decompressed, but otherwise unremarkable in appearance. Bilateral adrenal glands are normal in appearance. Stomach/Bowel: Unenhanced appearance of the stomach is normal. No pathologic dilatation of small bowel or colon. The appendix is not confidently identified and may be surgically absent. Regardless, there are no  inflammatory changes noted adjacent to the cecum to suggest the presence of an acute appendicitis at this time. Vascular/Lymphatic: Aortic atherosclerosis with mild fusiform aneurysmal dilatation of the infrarenal abdominal aorta which measures up to 3.1  x 3.3 cm. 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 No lymphadenopathy noted in the abdomen or pelvis. Reproductive: Prostate gland and seminal vesicles are unremarkable in appearance. Other: No significant volume of ascites.  No pneumoperitoneum. Musculoskeletal: There are no aggressive appearing lytic or blastic lesions noted in the visualized portions of the skeleton. IMPRESSION: 1. 1 cm calculus at the left ureteropelvic junction with mild to moderate proximal left hydronephrosis and extensive perinephric stranding indicative of obstruction. 2. No acute findings noted in the thorax. 3. 2 additional nonobstructive calculi in the lower pole collecting system of left kidney measuring up to 4 mm. 4. Aortic atherosclerosis, in addition to left main and 3 vessel coronary artery disease. Assessment for potential risk factor modification, dietary therapy or pharmacologic therapy may be warranted, if clinically indicated. 5. Probable cholelithiasis without evidence of acute cholecystitis at this time. 6. Mild cardiomegaly. 7. 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 Electronically Signed   By: Vinnie Langton M.D.   On: 06/24/2019 15:16   Ct Chest Wo Contrast  Result Date: 06/24/2019 CLINICAL DATA:  78 year old male with recent history of abdominal pain. EXAM: CT CHEST, ABDOMEN AND PELVIS WITHOUT CONTRAST TECHNIQUE: Multidetector CT imaging of the chest, abdomen and pelvis was performed following the standard protocol without IV contrast. COMPARISON:  No priors. FINDINGS: CT CHEST FINDINGS Cardiovascular: Heart size is mildly enlarged. There is no significant  pericardial fluid, thickening or pericardial calcification. There is aortic atherosclerosis, as well as atherosclerosis of the great vessels of the mediastinum and the coronary arteries, including calcified atherosclerotic plaque in the left main, left anterior descending, left circumflex and right coronary arteries. Mediastinum/Nodes: No pathologically enlarged mediastinal or hilar lymph nodes. Please note that accurate exclusion of hilar adenopathy is limited on noncontrast CT scans. Esophagus is unremarkable in appearance. No axillary lymphadenopathy. Lungs/Pleura: Areas of scarring or subsegmental atelectasis in the lower lobes of the lungs bilaterally. A few scattered small pulmonary nodules are noted in the lungs, largest of which is in the right middle lobe (axial image 84 of series 4) measuring 5 mm. No other larger more suspicious appearing pulmonary nodules or masses are noted. No acute consolidative airspace disease. No pleural effusions. Musculoskeletal: Large fatty attenuation lesion in the left chest wall extending into the left axillary region, incompletely imaged, but measuring at least 22.5 x 14.8 cm. There are no aggressive appearing lytic or blastic lesions noted in the visualized portions of the skeleton. CT ABDOMEN PELVIS FINDINGS Hepatobiliary: Diffuse low attenuation throughout the hepatic parenchyma, indicative of hepatic steatosis. No definite suspicious cystic or solid hepatic lesions are confidently identified on today's noncontrast CT examination. Intermediate attenuation lying dependently in the gallbladder, likely to represent tiny noncalcified gallstones. No findings to suggest an acute cholecystitis at this time. Pancreas: No pancreatic mass. No pancreatic ductal dilatation. No pancreatic or peripancreatic fluid collections or inflammatory changes. Spleen: Unremarkable. Adrenals/Urinary Tract: Nonobstructive calculi in the lower pole collecting system of left kidney measuring up to 4  mm. In addition, there is a 1 cm calculus at the left ureteropelvic junction associated with mild-to-moderate proximal hydronephrosis and extensive perinephric stranding indicating obstruction at this time. No additional calculi are noted in the collecting system of the right kidney, along the course of the right ureter or within the lumen of the urinary bladder. No right hydroureteronephrosis. Unenhanced appearance of the kidneys is otherwise unremarkable. Urinary bladder is nearly  decompressed, but otherwise unremarkable in appearance. Bilateral adrenal glands are normal in appearance. Stomach/Bowel: Unenhanced appearance of the stomach is normal. No pathologic dilatation of small bowel or colon. The appendix is not confidently identified and may be surgically absent. Regardless, there are no inflammatory changes noted adjacent to the cecum to suggest the presence of an acute appendicitis at this time. 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 No lymphadenopathy noted in the abdomen or pelvis. Reproductive: Prostate gland and seminal vesicles are unremarkable in appearance. Other: No significant volume of ascites.  No pneumoperitoneum. Musculoskeletal: There are no aggressive appearing lytic or blastic lesions noted in the visualized portions of the skeleton. IMPRESSION: 1. 1 cm calculus at the left ureteropelvic junction with mild to moderate proximal left hydronephrosis and extensive perinephric stranding indicative of obstruction. 2. No acute findings noted in the thorax. 3. 2 additional nonobstructive calculi in the lower pole collecting system of left kidney measuring up to 4 mm. 4. Aortic atherosclerosis, in addition to left main and 3 vessel coronary artery disease. Assessment for potential  risk factor modification, dietary therapy or pharmacologic therapy may be warranted, if clinically indicated. 5. Probable cholelithiasis without evidence of acute cholecystitis at this time. 6. Mild cardiomegaly. 7. 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 Electronically Signed   By: Vinnie Langton M.D.   On: 06/24/2019 15:16   Dg Chest Portable 1 View  Result Date: 06/24/2019 CLINICAL DATA:  Shortness of breath. EXAM: PORTABLE CHEST 1 VIEW COMPARISON:  None. FINDINGS: Lung volumes are low but the lungs are clear. Heart size is upper normal. No pneumothorax or pleural fluid. No acute or focal bony abnormality. IMPRESSION: No acute disease. Electronically Signed   By: Inge Rise M.D.   On: 06/24/2019 12:22    Labs:  CBC: Recent Labs    06/24/19 1158  WBC 9.4  HGB 13.0  HCT 39.6  PLT 149*    COAGS: Recent Labs    06/24/19 1158  INR 2.2*    BMP: Recent Labs    06/24/19 1158  NA 141  K 3.1*  CL 110  CO2 17*  GLUCOSE 122*  BUN 28*  CALCIUM 8.6*  CREATININE 2.26*  GFRNONAA 27*  GFRAA 31*    LIVER FUNCTION TESTS: Recent Labs    06/24/19 1158  BILITOT 2.0*  AST 41  ALT 19  ALKPHOS 59  PROT 6.7  ALBUMIN 3.4*    TUMOR MARKERS: No results for input(s): AFPTM, CEA, CA199, CHROMGRNA in the last 8760 hours.  Assessment and Plan: L ureteral stone with severe sepsis  Patient with no known history of kidney stones presents with several day history of feeling poorly at home.  He developed a fever and [facility].   He is found to have a left ureteral stone with sepsis with moderate hydronephrosis. IR consulted for percutaneous nephrostomy tube placement.  He last took Xarelto yesterday.  Case reviewed and approved by Dr. Earleen Newport.   Plan to proceed with PCN placement now in IR.   Risks and benefits of left PCN placement was discussed with the patient including, but not limited to,  infection, bleeding, significant bleeding causing loss or decrease in renal function or damage to adjacent structures.   All of  the patient's questions were answered, patient is agreeable to proceed.  Consent signed and in chart.  Thank you for this interesting consult.  I greatly enjoyed meeting Patrick Bishop and look forward to participating in their care.  A copy of this report was sent to the requesting provider on this date.  Electronically Signed: Docia Barrier, PA 06/24/2019, 5:09 PM   I spent a total of 40 Minutes    in face to face in clinical consultation, greater than 50% of which was counseling/coordinating care for urosepsis, hydronephrosis.

## 2019-06-24 NOTE — Procedures (Signed)
Interventional Radiology Procedure Note  Procedure: Placement of a left PCN.  43F drain.  Complications: None Recommendations:  - Follow up culture - Do not submerge - Routine care   Signed,  Dulcy Fanny. Earleen Newport, DO

## 2019-06-24 NOTE — Progress Notes (Addendum)
Pharmacy Antibiotic Note  Quinterious Walraven is a 78 y.o. male admitted on 06/24/2019 with sepsis. Pt complaining of left flank pain with a 1 cm stone in left UPJ. Febrile, WBC 9.4, Scr 2.26. Pharmacy has been consulted for cefepime dosing.  Received Ceftriaxone 1 g IV 1x in the ED.   Plan: Cefepime 2 g IV 1x then 2 g IV q12h Monitor clinical improvement and renal function Monitor C/s   Height: 6\' 1"  (185.4 cm) Weight: (!) 365 lb (165.6 kg) IBW/kg (Calculated) : 79.9  Temp (24hrs), Avg:100.8 F (38.2 C), Min:98.4 F (36.9 C), Max:103.7 F (39.8 C)  Recent Labs  Lab 06/24/19 1158 06/24/19 1400  WBC 9.4  --   CREATININE 2.26*  --   LATICACIDVEN 5.6* 3.0*    Estimated Creatinine Clearance: 42.8 mL/min (A) (by C-G formula based on SCr of 2.26 mg/dL (H)).    Allergies  Allergen Reactions  . Aldactone [Spironolactone]     - unknown     Antimicrobials this admission: Ceftriaxone 9/28 >> 9/28 Cefepime 9/28 >>  Microbiology results: 9/28 BCx: pending 9/28 UCx: pending 9/28 COVID: pending   Thank you for allowing pharmacy to be a part of this patient's care.  Lorel Monaco, PharmD PGY1 Ambulatory Care Resident [employer] # [phone]

## 2019-06-24 NOTE — Consult Note (Signed)
Cardiology Consultation:   Patient ID: Patrick Bishop MRN: [id number]; DOB: 1940-12-28  Admit date: 06/24/2019 Date of Consult: 06/24/2019  Primary Care Provider: Lujean Amel, MD Primary Cardiologist: Glenetta Hew, MD  Primary Electrophysiologist:  None    Patient Profile:   Patrick Bishop is a 78 y.o. male with a history mild non-obstructive CAD on cardiac catheterization in 2011 while in [place], chronic diastolic CHF, persistent atrial fibrillation on Xarelto,  chronic venous insufficiency s/p bilateral iliac and femoral stents while in [place], hypertension, hyperlipidemia, pulmonary hypertension, obstructive sleep apnea on CPAP, and morbid obesity at the request of Dr. Gilford Raid.   History of Present Illness:   Mr. Pickel is a 78 year old male with the above history. Patient previously lived in [place] and saw a Film/video editor (Dr. Cleda Mccreedy) there. Patient has left heart catheterization in 2011 in [place] which showed mild non-obstructive CAD with 20% stenosis of LAD and 30% stenosis of the RCA. LVEF 45% at that time. Mild pulmonary hypertension also noted at that time. Patient also has known chronic venous insufficiency and underwent stenting from right common iliac into to external iliac and right common femoral vein in 08/2014 and balloon angioplasty with stenting from inferior vena cava into the common iliac, external iliac and common femoral vein of the left side in 10/2014. Most recent Echo was in 04/26/2017 which showed LVEF of 60-65% with moderate concentric LVH, moderate TR, and moderate pulmonary hypertension. Patient first seen by Dr. Ellyn Hack in 10/2017 to establish care here in [place]. Patient was last seen by Dr. Ellyn Hack for telehealth visit in 12/2018 at which time patient reported stable edema and dyspnea on exertion but no chest pain or palpitations.   Patient presented to the ED today via EMS for independent living facility for further evaluation of abdominal pain. In the ED,  patient febrile with temp of 103, tachycardic, and tachypneic. EKG showed atrial fibrillation with rate of 129 bpm. High-sensitivity troponin elevated at 371 >> 496. Chest x-ray showed no acute findings. Chest CT showed mild cardiomegaly and aortic atherosclerosis in addition to left main and 3 vessel CAD. Large fatty attenuation lesion in the left chest wall also noted presumably a large lipoma. Abdominal/Pelvic CT showed 1cm calculus at the left ureteropelvic junction with mild to moderate proximal left hydronephrosis and extensive perinephric stranding indicative of obstruction, 2 additional non-obstructive calculi in the lower pole collecting system of left kidney measuring up to 15m, and probably cholelithiasis without evidence of acute cholecystitis.  WBC 9.4, Hgb 13.0, Plts 149. Na 141, K 3.1, Glucose 122, BUN 28, Scr 2.26. AST 41, ALT 19, Alk Phos 59, Total Bili 2.0. Lipase normal. Lactic acid elevated at 5.6. COVID-19 testing negative. Urinalysis showed hazy appearance, large Hgb, 30 protein, small leukocytes, positive nitrites, and many bacteria. Urine and blood cultures pending.   Cardiology consulted for elevated troponin.  At the time of this evaluation, patient is hypertensive with systolic BP in the 707Mto 80s.  Receiving IV fluids.  Patient is alert and oriented and able to hold a conversation.  He reports abdominal pain for couple days as well as nausea and vomiting.  He denies any chest pain.  He does state he has had significant shortness of breath recently both with activity and at rest.  He has known sleep apnea and has a CPAP machine.  However, over the last couple nights he states he has been breathing so heavily that his CPAP machine will not stay on.  He has chronic lower extremity  edema.  Daughter states she normally wraps his legs about 3 times per week she has not been doing that recently because he has some wounds.  Daughter states that his edema does look worse today but has been at  his baseline recently.  He denies any palpitations, lightheadedness, dizziness.  Patient has bladder control problems.  He is febrile here in the ED but is unsure how long he has had a fever.  Patient lives in a retirement facility.  He is very sedentary.  He denies any abnormal bleeding including hematochezia or hematuria.  Nephrology is planning urgent nephrostomy tube placement.  Heart Pathway Score:     Past Medical History:  Diagnosis Date   Chronic diastolic heart failure (HCC)    Normal LV Fxn by Echo 04/2017    Chronic venous insufficiency 2015   s/p Bilateral Iliac & Femoral Stents (including IVC) - DEEP Venous Reflux.  Also s/p Bilateral GSV Ablation.   Essential hypertension    Hyperlipidemia with target LDL less than 100    Longstanding persistent atrial fibrillation    Obesity    OSA on CPAP    May need New CPAP machine & CPAP MD)   Pulmonary hypertension [facility])     Past Surgical History:  Procedure Laterality Date   ABDOMINAL VENOUS DUPLEX Bilateral 07/2014   ([place]iology: Dr. Cleda Mccreedy): (prior to Bilateral Iliac-Femoral Venous Stent Placement): Patent IVC.  No DVT B/L Iliac or Com fem V..  +++ DEEP V REFLUX B/L ILIAC & FEM V.  Absent Superficial V Reflux B/L x L SSV (too tortuous for intervention).  BL GSV occluded - prior ablation.    ABDOMINAL VENOUS DUPLEX Bilateral 11/2014   Absent DVT.  Left common femoral venous reflux: 1508 MS left common femoral vein, 2284MS left proximal femoral vein -> cystic structure noted in the left popliteal vein (7.6 x 1.4 x 3.8 cm).;  Right leg deep venous reflux noted in the distal iliac, common femoral, proximal profunda femoral, proximal superficial femoral, superficial femoral and popliteal vein.   CARDIAC CATHETERIZATION  10/2009   [facility] - Dr. 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  EF~45%.  Mild pulmonary pretension.   CARDIAC PET  08/2009    Inferolateral partially reversible defect -- > false positive by cath   GREATER SAPHENOUS VEIN ABLATION Bilateral Before 2015   [facility] (Dr. Eugenie Filler)   IR [facility] [place]  EA ADD VEIN  INC ANGIOPLASTY Left 10/2014   [phone], Dr. 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 Stormy Fabian): US Guided --> R Common & External Iliac, Common Femoral Venography with Placement of 24 mmx 70 mm Stent - R Com Iliac-Ext Iliac- & Com Fem V. reducing 60-80% venous compression to ~0%).;; also noted significant L-sided venous compression (staged stenting).    NM MYOVIEW LTD  11/2005   Adenosine Myoview [facility]iology - Dr. Cleda Mccreedy): Normal myocardial perfusion scan. No ischemia or infarction.   TRANSTHORACIC ECHOCARDIOGRAM  04/26/2017    [facility] Cardiology - Dr. Cleda Mccreedy) - report not available.  Per clinic note: Normal global LV function. Mild concentric temperature. Mild  TR. Mild pulmonary hypertension.;;    TRANSTHORACIC ECHOCARDIOGRAM  7/'15; 7/'17   [facility] - Dr. Saverio Danker) a) low normal LV function (EF 56%). Moderate concentric LVH. Mild LA dilation. Normal pulmonary pressures.  ;; b) normal global function - EF 60-65%. Moderate pulmonary hypertension. Biatrial enlargement. Moderate concentric LVH.   TRANSTHORACIC ECHOCARDIOGRAM  02/2010   [facility] Cardiology - Dr. Saverio Danker): Normal global LV function. Mild LVH. EF 60%.; November 2010. Moderate LV dilation. Moderate LVH. Normal EF 50-55%. Mild to moderate pulmonary hypertension. (July 2012 showed normal function with mild LVH, mild MR noted in August 2014)     Home Medications:  Prior to Admission medications   Medication Sig  Start Date End Date Taking? Authorizing Provider  amLODipine (NORVASC) 10 MG tablet TAKE 1 TABLET DAILY Patient taking differently: Take 10 mg by mouth daily.  06/04/19  Yes [name], MD  atorvastatin (LIPITOR) 10 MG tablet TAKE 1 TABLET DAILY AT 6 P.M. Patient taking differently: Take 10 mg by mouth daily.  11/30/18  Yes [name], MD  furosemide (LASIX) 40 MG tablet Take  20 mg  ( 1/2 tablet) twice a day except on Monday and Thursday take 40 mg twice a day. Patient taking differently: Take 20-40 mg by mouth See admin instructions. Take  20 mg  ( 1/2 tablet) twice a day except on Monday and Thursday take 40 mg twice a day. 07/02/18  Yes [name], MD  HYDROcodone-acetaminophen (NORCO/VICODIN) 5-325 MG tablet Take 1 tablet by mouth 2 (two) times daily.  09/04/17  Yes [provider]  metoprolol tartrate (LOPRESSOR) 25 MG tablet TAKE ONE TABLET (25 MG TOTAL) EVERY MORNING AND ONE AND ONE-HALF TABLETS (37.5 MG TOTAL) EVERY EVENING Patient taking differently: Take 25-37.5 mg by mouth See admin instructions. Take 34m in the morning and 37.567min the evening. 04/19/19  Yes HaLeonie ManMD  XARELTO 20 MG TABS tablet TAKE 1 TABLET DAILY WITH SUPPER Patient taking differently: Take 20 mg by mouth daily with supper.  11/09/18  Yes HaLeonie ManMD    Inpatient Medications: Scheduled Meds:  Continuous Infusions:  cefTRIAXone (ROCEPHIN)  IV Stopped (06/24/19 1458)   PRN Meds:   Allergies:    Allergies  Allergen Reactions   Aldactone [Spironolactone]     - unknown     Social History:   Social History   Socioeconomic History   Marital status: Widowed    Spouse name: Not on file   Number of children: 2   Years of education: Not on file   Highest education level: Some college, no degree  Occupational History   Occupation: Retired    Comment: [employer] -- E-8 (Sr. 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    Worry: Not on file    Inability: Not on fiLexicographereeds    Medical: Not on file    Non-medical: Not on file  Tobacco Use   Smoking status: Former Smoker    Packs/day: 1.00    Years: 35.00    Pack years: 35.00    Types: Cigarettes    Quit date: 1996    Years since quitting: 24.7   Smokeless tobacco: Never Used   Tobacco comment:  smoked from age [age]   Substance and Sexual Activity   Alcohol use: No    Frequency: Never    Comment: Not any more   Drug use: No   Sexual activity: Not Currently  Lifestyle   Physical activity    Days per week: Not on file    Minutes per session: Not on file   Stress: Not on file  Relationships   Social connections    Talks on phone: Not on file    Gets together: Not on file    Attends religious service: Not on file    Active member of club or organization: Not on file    Attends meetings of clubs or organizations: Not on file    Relationship status: Not on file   Intimate partner violence    Fear of current or ex partner: Not on file    Emotionally abused: Not on file    Physically abused: Not on file    Forced sexual activity: Not on file  Other Topics Concern   Not on file  Social History Narrative   Kelcey recently moved to [place] in January 2019.  This was to be close to his daughter who is here with him today.  He has 2 daughters and one grandchild.      Jacqualine Code,  [place] --PCP was Sherlynn Carbon, MD.  Cardiologist: Saverio Danker, MD (who followed him every 3 months with routine visits.  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   He currently lives at [place] [facility] (Dr. Albesa Seen) --however he hopes to be moving into his  daughter's house once they have not established and ready for him.    Family History:    Family History  Problem Relation Age of Onset   Cancer Mother    Heart attack Father 52   Hypertension Brother    Cancer Sister    Other Neg Hx        He really does not know much about his parents and siblings health, but they did not speak about healthcare     ROS:  Please see the history of present illness.  All other ROS reviewed and negative.     Physical Exam/Data:   Vitals:   06/24/19 1445 06/24/19 1515 06/24/19 1530 06/24/19 1545  BP: (!) 73/63 (!) 83/52 (!) 90/55 102/86  Pulse: 86 90 76 87  Resp: 20 (!) 27 (!) 29 (!) 22  Temp:      TempSrc:      SpO2: 95% 95% 96% 94%  Height:        Intake/Output Summary (Last 24 hours) at 06/24/2019 1606 Last data filed at 06/24/2019 1417 Gross per 24 hour  Intake 1000 ml  Output --  Net 1000 ml   Last 3 Weights 01/03/2019 11/08/2018 07/02/2018  Weight (lbs) (No Data) 370 lb 366 lb  Weight (kg) (No Data) 167.831 kg 166.017 kg     Body mass index is 48.82 kg/m.  General: 78 y.o. ill appearing male resting comfortably in mild distress. HEENT: Normocephalic and atraumatic. Sclera clear. EOMs intact. Neck: Supple. No carotid bruits. Difficult to assess JVD due to body habitus.  Heart: Irregularly irregular rhythm with normal rate. Distinct S1 and S2. No murmurs, gallops, or rubs. Radial pulses 2+ and equal bilaterally. Lungs: Mildly tachypneic but no significant increased work of breathing. Clear to ausculation bilaterally. No significant wheezes, rhonchi, or  rales.  Abdomen: Soft, obese, and non-tender to palpation. Bowel sounds present. MSK: Generalized weakness Extremities: Chronic tight bilateral lower extremity edema (right > left) with skin changes consistent with chronic venous insufficiency. Skin: Warm and dry. Chronic hyperpigmentation of lower extremities up to mid shins with ulcer on dorsal surface of right foot. Neuro: Alert  and oriented x3. No focal deficits. Psych: Normal affect. Responds appropriately.   EKG:  The EKG was personally reviewed and demonstrates:  Atrial fibrillation, rate 129 bpm, with low voltage and non-specific ST/T changes. Telemetry:  Telemetry was personally reviewed and demonstrates:  Atrial fibrillation with rates mostly in the 80's to 110's but as high as the 120's to 140's.  Relevant CV Studies:  Echocardiogram 04/26/2017: Report not available. Per clinic note: Normal global LV function. Mild concentric temperature. Mild TR. Mild pulmonary hypertension.  Laboratory Data:  High Sensitivity Troponin:   Recent Labs  Lab 06/24/19 1158 06/24/19 1400  TROPONINIHS 371* 496*     Chemistry Recent Labs  Lab 06/24/19 1158  NA 141  K 3.1*  CL 110  CO2 17*  GLUCOSE 122*  BUN 28*  CREATININE 2.26*  CALCIUM 8.6*  GFRNONAA 27*  GFRAA 31*  ANIONGAP 14    Recent Labs  Lab 06/24/19 1158  PROT 6.7  ALBUMIN 3.4*  AST 41  ALT 19  ALKPHOS 59  BILITOT 2.0*   Hematology Recent Labs  Lab 06/24/19 1158  WBC 9.4  RBC 4.17*  HGB 13.0  HCT 39.6  MCV 95.0  MCH 31.2  MCHC 32.8  RDW 13.9  PLT 149*   BNPNo results for input(s): BNP, PROBNP in the last 168 hours.  DDimer No results for input(s): DDIMER in the last 168 hours.   Radiology/Studies:  Ct Abdomen Pelvis Wo Contrast  Result Date: 06/24/2019 CLINICAL DATA:  78 year old male with recent history of abdominal pain. EXAM: CT CHEST, ABDOMEN AND PELVIS WITHOUT CONTRAST TECHNIQUE: Multidetector CT imaging of the chest, abdomen and pelvis was performed following the standard protocol without IV contrast. COMPARISON:  No priors. FINDINGS: CT CHEST FINDINGS Cardiovascular: Heart size is mildly enlarged. There is no significant pericardial fluid, thickening or pericardial calcification. There is aortic atherosclerosis, as well as atherosclerosis of the great vessels of the mediastinum and the coronary arteries, including calcified  atherosclerotic plaque in the left main, left anterior descending, left circumflex and right coronary arteries. Mediastinum/Nodes: No pathologically enlarged mediastinal or hilar lymph nodes. Please note that accurate exclusion of hilar adenopathy is limited on noncontrast CT scans. Esophagus is unremarkable in appearance. No axillary lymphadenopathy. Lungs/Pleura: Areas of scarring or subsegmental atelectasis in the lower lobes of the lungs bilaterally. A few scattered small pulmonary nodules are noted in the lungs, largest of which is in the right middle lobe (axial image 84 of series 4) measuring 5 mm. No other larger more suspicious appearing pulmonary nodules or masses are noted. No acute consolidative airspace disease. No pleural effusions. Musculoskeletal: Large fatty attenuation lesion in the left chest wall extending into the left axillary region, incompletely imaged, but measuring at least 22.5 x 14.8 cm. There are no aggressive appearing lytic or blastic lesions noted in the visualized portions of the skeleton. CT ABDOMEN PELVIS FINDINGS Hepatobiliary: Diffuse low attenuation throughout the hepatic parenchyma, indicative of hepatic steatosis. No definite suspicious cystic or solid hepatic lesions are confidently identified on today's noncontrast CT examination. Intermediate attenuation lying dependently in the gallbladder, likely to represent tiny noncalcified gallstones. No findings to suggest an acute cholecystitis at  this time. Pancreas: No pancreatic mass. No pancreatic ductal dilatation. No pancreatic or peripancreatic fluid collections or inflammatory changes. Spleen: Unremarkable. Adrenals/Urinary Tract: Nonobstructive calculi in the lower pole collecting system of left kidney measuring up to 4 mm. In addition, there is a 1 cm calculus at the left ureteropelvic junction associated with mild-to-moderate proximal hydronephrosis and extensive perinephric stranding indicating obstruction at this time.  No additional calculi are noted in the collecting system of the right kidney, along the course of the right ureter or within the lumen of the urinary bladder. No right hydroureteronephrosis. Unenhanced appearance of the kidneys is otherwise unremarkable. Urinary bladder is nearly decompressed, but otherwise unremarkable in appearance. Bilateral adrenal glands are normal in appearance. Stomach/Bowel: Unenhanced appearance of the stomach is normal. No pathologic dilatation of small bowel or colon. The appendix is not confidently identified and may be surgically absent. Regardless, there are no inflammatory changes noted adjacent to the cecum to suggest the presence of an acute appendicitis at this time. 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 No lymphadenopathy noted in the abdomen or pelvis. Reproductive: Prostate gland and seminal vesicles are unremarkable in appearance. Other: No significant volume of ascites.  No pneumoperitoneum. Musculoskeletal: There are no aggressive appearing lytic or blastic lesions noted in the visualized portions of the skeleton. IMPRESSION: 1. 1 cm calculus at the left ureteropelvic junction with mild to moderate proximal left hydronephrosis and extensive perinephric stranding indicative of obstruction. 2. No acute findings noted in the thorax. 3. 2 additional nonobstructive calculi in the lower pole collecting system of left kidney measuring up to 4 mm. 4. Aortic atherosclerosis, in addition to left main and 3 vessel coronary artery disease. Assessment for potential risk factor modification, dietary therapy or pharmacologic therapy may be warranted, if clinically indicated. 5. Probable cholelithiasis without evidence of acute cholecystitis at this time. 6. Mild  cardiomegaly. 7. 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 Electronically Signed   By: Vinnie Langton M.D.   On: 06/24/2019 15:16   Ct Chest Wo Contrast  Result Date: 06/24/2019 CLINICAL DATA:  78 year old male with recent history of abdominal pain. EXAM: CT CHEST, ABDOMEN AND PELVIS WITHOUT CONTRAST TECHNIQUE: Multidetector CT imaging of the chest, abdomen and pelvis was performed following the standard protocol without IV contrast. COMPARISON:  No priors. FINDINGS: CT CHEST FINDINGS Cardiovascular: Heart size is mildly enlarged. There is no significant pericardial fluid, thickening or pericardial calcification. There is aortic atherosclerosis, as well as atherosclerosis of the great vessels of the mediastinum and the coronary arteries, including calcified atherosclerotic plaque in the left main, left anterior descending, left circumflex and right coronary arteries. Mediastinum/Nodes: No pathologically enlarged mediastinal or hilar lymph nodes. Please note that accurate exclusion of hilar adenopathy is limited on noncontrast CT scans. Esophagus is unremarkable in appearance. No axillary lymphadenopathy. Lungs/Pleura: Areas of scarring or subsegmental atelectasis in the lower lobes of the lungs bilaterally. A few scattered small pulmonary nodules are noted in the lungs, largest of which is in the right middle lobe (axial image 84 of series 4) measuring 5 mm. No other larger more suspicious appearing pulmonary nodules or masses are noted. No acute consolidative airspace disease. No pleural effusions. Musculoskeletal: Large fatty attenuation lesion in the left chest wall extending into the left axillary region,  incompletely imaged, but measuring at least 22.5 x 14.8 cm. There are no aggressive appearing lytic or blastic lesions noted in the visualized portions of the skeleton. CT ABDOMEN PELVIS FINDINGS Hepatobiliary: Diffuse low attenuation throughout the hepatic  parenchyma, indicative of hepatic steatosis. No definite suspicious cystic or solid hepatic lesions are confidently identified on today's noncontrast CT examination. Intermediate attenuation lying dependently in the gallbladder, likely to represent tiny noncalcified gallstones. No findings to suggest an acute cholecystitis at this time. Pancreas: No pancreatic mass. No pancreatic ductal dilatation. No pancreatic or peripancreatic fluid collections or inflammatory changes. Spleen: Unremarkable. Adrenals/Urinary Tract: Nonobstructive calculi in the lower pole collecting system of left kidney measuring up to 4 mm. In addition, there is a 1 cm calculus at the left ureteropelvic junction associated with mild-to-moderate proximal hydronephrosis and extensive perinephric stranding indicating obstruction at this time. No additional calculi are noted in the collecting system of the right kidney, along the course of the right ureter or within the lumen of the urinary bladder. No right hydroureteronephrosis. Unenhanced appearance of the kidneys is otherwise unremarkable. Urinary bladder is nearly decompressed, but otherwise unremarkable in appearance. Bilateral adrenal glands are normal in appearance. Stomach/Bowel: Unenhanced appearance of the stomach is normal. No pathologic dilatation of small bowel or colon. The appendix is not confidently identified and may be surgically absent. Regardless, there are no inflammatory changes noted adjacent to the cecum to suggest the presence of an acute appendicitis at this time. 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 No lymphadenopathy noted in the abdomen or pelvis. Reproductive: Prostate gland and seminal vesicles are unremarkable in appearance. Other: No significant  volume of ascites.  No pneumoperitoneum. Musculoskeletal: There are no aggressive appearing lytic or blastic lesions noted in the visualized portions of the skeleton. IMPRESSION: 1. 1 cm calculus at the left ureteropelvic junction with mild to moderate proximal left hydronephrosis and extensive perinephric stranding indicative of obstruction. 2. No acute findings noted in the thorax. 3. 2 additional nonobstructive calculi in the lower pole collecting system of left kidney measuring up to 4 mm. 4. Aortic atherosclerosis, in addition to left main and 3 vessel coronary artery disease. Assessment for potential risk factor modification, dietary therapy or pharmacologic therapy may be warranted, if clinically indicated. 5. Probable cholelithiasis without evidence of acute cholecystitis at this time. 6. Mild cardiomegaly. 7. 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 Electronically Signed   By: Vinnie Langton M.D.   On: 06/24/2019 15:16   Dg Chest Portable 1 View  Result Date: 06/24/2019 CLINICAL DATA:  Shortness of breath. EXAM: PORTABLE CHEST 1 VIEW COMPARISON:  None. FINDINGS: Lung volumes are low but the lungs are clear. Heart size is upper normal. No pneumothorax or pleural fluid. No acute or focal bony abnormality. IMPRESSION: No acute disease. Electronically Signed   By: Inge Rise M.D.   On: 06/24/2019 12:22    Assessment and Plan:   Elevated Troponin - High-sensitivity troponin elevated at 371 >> 496.  - EKG shows atrial fibrillation, rate 129 bpm, with low voltage and non-specific ST/T change. No acute changes compared to prior changes. - Patient denies any chest pain but has had more shortness of breath in recently. - Suspect this is demand ischemia in setting of sepsis secondary due to nephrolithiasis  with obstruction. Can check Echo tomorrow.  CAD - Cardiac cath in 2011 in [place] showed mild non-obstruction CAD with 20% stenosis of LAD and 30%  stenosis of the RCA. - No angina.  - Will check Echo tomorrow.   Persistent Atrial Fibrillation - Telemetry shows atrial fibrillation with rates mostly in 80's to 110's but as high as 140 at times. Suspect RVR is due to sepsis. - Will hold Lopressor now due to hypotension. - CHA2DS-VASc = 5 (CHF, CAD, HTN, age x2). Hold Xarelto due to plans for nephrostomy tube placement tonight. Restart when stable from bleeding standpoint.   Chronic Diastolic CHF - Most recent Echo from 2018 showed LVEF of 60-65% with moderate concentric LVH, moderate TR, and moderate pulmonary hypertension. - Patient does report worsening shortness of breath recently but likely multifactorial due to pulmonary hypertension, deconditioning, COPD, and diastolic CHF. - Chest x-ray showed no overt edema. - Will recheck Echo tomorrow. - Hold home Lasix for now given hypotension. - Will need to monitor volume status closely with IV fluids.  Hypotension - Patient has a history of hypertensive but is hypotensive with systolic BP in the 44'B to 80's. Receiving IV fluids. Patient alert and oriented at this time. Pulses present and able to hold conversation.  - Hold home Norvasc, Lopressor, and Lasix. - Continue fluids for now.  Hyperlipidemia - Will check lipid panel. - Continue home Lipitor 86m daily for now.  Chronic Venous Insufficiency s/p Stenting - Patient has history of chronic venous insufficiency and underwent stenting from right common iliac into to external iliac and right common femoral vein in 08/2014 and balloon angioplasty with stenting from inferior vena cava into the common iliac, external iliac and common femoral vein of the left side in 10/2014.  Sepsis Secondary to Nephrolithiasis with Obstruction - Patient febrile with temp of 103 on arrival. Lactic acid 5.6 improved to 3.0 with fluids but still elevated. - Urology and IR planning for nephrostomy tube placement tonight. - Management per primary  team.  AKI - Serum creatinine 2.26 on presentation. Baseline unknown. - Likely secondary to obstruction. Plan is for nephrostomy tube placement tonight. - Continue to monitor daily BMET.  Hypokalemia - Potassium 3.1 - Recommend supplementation. Will defer to primary team given AKI.  For questions or updates, please contact [place] consult [email or web address] for contact info under     Signed, CDarreld Mclean PA-C  06/24/2019 4:06 PM

## 2019-06-24 NOTE — Anesthesia Preprocedure Evaluation (Deleted)
Anesthesia Evaluation    Reviewed: Allergy & Precautions, Patient's Chart, lab work & pertinent test results  Airway        Dental   Pulmonary sleep apnea , former smoker,           Cardiovascular hypertension, Pt. on medications and Pt. on home beta blockers +CHF  + dysrhythmias Atrial Fibrillation      Neuro/Psych negative neurological ROS  negative psych ROS   GI/Hepatic negative GI ROS, Neg liver ROS,   Endo/Other  negative endocrine ROS  Renal/GU negative Renal ROS     Musculoskeletal  (+) Arthritis ,   Abdominal   Peds  Hematology negative hematology ROS (+)   Anesthesia Other Findings   Reproductive/Obstetrics                             Anesthesia Physical Anesthesia Plan  ASA: III  Anesthesia Plan: General   Post-op Pain Management:    Induction: Intravenous  PONV Risk Score and Plan: 3 and Ondansetron, Dexamethasone and Treatment may vary due to age or medical condition  Airway Management Planned: Oral ETT  Additional Equipment: None  Intra-op Plan:   Post-operative Plan: Extubation in OR  Informed Consent:   Plan Discussed with: CRNA  Anesthesia Plan Comments: (Possible LMA)        Anesthesia Quick Evaluation

## 2019-06-24 NOTE — ED Notes (Signed)
[facility] coverage team paged in reference to hypotension.

## 2019-06-24 NOTE — Consult Note (Signed)
Urology Consult   Physician requesting consult: Dr. Isla Pence  Reason for consult: left ureteral stone with severe sepsis  History of Present Illness: Patrick Bishop is a 78 y.o. male with the PMHx as outlined below who presents with a 24-hour history of worsening sharp, constant, non-radiating left-sided flank pain associated with nausea and vomiting as well as subjective fevers of 103 F at home.  He presented to the emergency department with hypotension with systolic pressures in the 70s, tachycardic and hypoxic.  CT abdomen and pelvis revealed a 1 cm left UPJ calculus with left-sided hydronephrosis and perinephric stranding.  He has no prior history of stones.  Currently, his pain is controlled, but his vital signs remain labile despite multiple fluid boluses.  From urinary standpoint, the patient reports that he has a fluctuating force of stream and struggles with urge incontinence due to his habitus.  He is accompanied at bedside by his daughter.  Past Medical History:  Diagnosis Date  . Chronic diastolic heart failure (HCC)    Normal LV Fxn by Echo 04/2017   . Chronic venous insufficiency 2015   s/p Bilateral Iliac & Femoral Stents (including IVC) - DEEP Venous Reflux.  Also s/p Bilateral GSV Ablation.  . Essential hypertension   . Hyperlipidemia with target LDL less than 100   . Longstanding persistent atrial fibrillation   . Obesity   . OSA on CPAP    May need New CPAP machine & CPAP MD)  . Pulmonary hypertension ([place])     Past Surgical History:  Procedure Laterality Date  . ABDOMINAL VENOUS DUPLEX Bilateral 07/2014   ([facility]: Dr. Cleda Mccreedy): (prior to Bilateral Iliac-Femoral Venous Stent Placement): Patent IVC.  No DVT B/L Iliac or Com fem V..  +++ DEEP V REFLUX B/L ILIAC & FEM V.  Absent Superficial V Reflux B/L x L SSV (too tortuous for intervention).  BL GSV occluded - prior ablation.   . ABDOMINAL VENOUS DUPLEX Bilateral 11/2014   Absent DVT.  Left common femoral  venous reflux: 1508 MS left common femoral vein, 2284MS left proximal femoral vein -> cystic structure noted in the left popliteal vein (7.6 x 1.4 x 3.8 cm).;  Right leg deep venous reflux noted in the distal iliac, common femoral, proximal profunda femoral, proximal superficial femoral, superficial femoral and popliteal vein.  [name] CARDIAC CATHETERIZATION  10/2009   [facility] - Dr. 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  EF~45%.  Mild pulmonary pretension.  [name] CARDIAC PET  08/2009   Inferolateral partially reversible defect -- > false positive by cath  . GREATER SAPHENOUS VEIN ABLATION Bilateral Before 2015   [facility] (Dr. Eugenie Filler)  . IR TRANSCATH PLC STENT  EA ADD VEIN  INC ANGIOPLASTY Left 10/2014   [facility], Dr. 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 IR TRANSCATH PLC STENT  INITIAL VEIN  INC ANGIOPLASTY Right 08/2014   [facility] - Dr. Stormy Fabian): US Guided --> R Common & External Iliac, Common Femoral Venography with Placement of 24 mmx 70 mm Stent - R Com Iliac-Ext Iliac- & Com Fem V. reducing 60-80% venous compression to ~0%).;; also noted significant L-sided venous compression (staged stenting).   [name] NM MYOVIEW LTD  11/2005   [facility] - Dr. Cleda Mccreedy): Normal myocardial perfusion scan. No ischemia or infarction.  . TRANSTHORACIC ECHOCARDIOGRAM  04/26/2017    [facility] - Dr. Cleda Mccreedy) - report not available.  Per clinic note: Normal global LV function. Mild concentric temperature. Mild TR. Mild pulmonary hypertension.;;   . TRANSTHORACIC ECHOCARDIOGRAM  7/'15; 7/'17   [facility] - Dr. Saverio Danker) a) low normal LV function (EF 56%). Moderate concentric LVH. Mild LA  dilation. Normal pulmonary pressures.  ;; b) normal global function - EF 60-65%. Moderate pulmonary hypertension. Biatrial enlargement. Moderate concentric LVH.  [name] TRANSTHORACIC ECHOCARDIOGRAM  02/2010   [facility] - Dr. Saverio Danker): Normal global LV function. Mild LVH. EF 60%.; November 2010. Moderate LV dilation. Moderate LVH. Normal EF 50-55%. Mild to moderate pulmonary hypertension. (July 2012 showed normal function with mild LVH, mild MR noted in August 2014)    Current Hospital Medications:  Home Meds:  Current Meds  Medication Sig  . amLODipine (NORVASC) 10 MG tablet TAKE 1 TABLET DAILY (Patient taking differently: Take 10 mg by mouth daily. )  . atorvastatin (LIPITOR) 10 MG tablet TAKE 1 TABLET DAILY AT 6 P.M. (Patient taking differently: Take 10 mg by mouth daily. )  . furosemide (LASIX) 40 MG tablet Take  20 mg  ( 1/2 tablet) twice a day except on Monday and Thursday take 40 mg twice a day. (Patient taking differently: Take 20-40 mg by mouth See admin instructions. Take  20 mg  ( 1/2 tablet) twice a day except on Monday and Thursday take 40 mg twice a day.)  . HYDROcodone-acetaminophen (NORCO/VICODIN) 5-325 MG tablet Take 1 tablet by mouth 2 (two) times daily.   . metoprolol tartrate (LOPRESSOR) 25 MG tablet TAKE ONE TABLET (25 MG TOTAL) EVERY MORNING AND ONE AND ONE-HALF TABLETS (37.5 MG TOTAL) EVERY EVENING (Patient taking differently: Take 25-37.5 mg by mouth See admin instructions. Take 25mg  in the morning and 37.5mg  in the evening.)  . XARELTO 20 MG TABS tablet TAKE 1 TABLET DAILY WITH SUPPER (Patient taking differently: Take 20 mg by mouth daily with supper. )    Scheduled Meds: Continuous Infusions: . cefTRIAXone (ROCEPHIN)  IV Stopped (06/24/19 1458)   PRN Meds:.  Allergies:  Allergies  Allergen Reactions  . Aldactone [Spironolactone]     - unknown     Family History  Problem Relation Age of Onset  . Cancer Mother   . Heart attack Father 61  .  Hypertension Brother   . Cancer Sister   . Other Neg Hx        He really does not know much about his parents and siblings health, but they did not speak about healthcare    Social History:  reports that he quit smoking about 24 years ago. His smoking use included cigarettes. He has a 35.00 pack-year smoking history. He has never used smokeless tobacco. He reports that he does not drink alcohol or use drugs.  ROS: A complete review of systems was performed.  All systems are negative except for pertinent findings as noted.  Physical Exam:  Vital signs in last 24 hours: Temp:  [98.4 F (36.9 C)-103.7 F (39.8 C)] 100.3 F (37.9 C) (09/28 1252) Pulse Rate:  [73-120] 76 (09/28 1615) Resp:  [20-47] 28 (09/28 1615) BP: (73-137)/(47-98) 89/47 (09/28 1615) SpO2:  [93 %-97 %] 95 % (09/28 1615) Constitutional:  Alert and oriented, No acute distress Cardiovascular: Regular rate and rhythm, No JVD Respiratory: Labored respiratory effort  despite 4 L nasal cannula GI: Abdomen obese, nontender, nondistended  GU: left CVA tenderness, condom catheter in place with no urine output Lymphatic: Diffuse bilateral lower extremity lymphedema Neurologic: Grossly intact, no focal deficits Psychiatric: Normal mood and affect  Laboratory Data:  Recent Labs    06/24/19 1158  WBC 9.4  HGB 13.0  HCT 39.6  PLT 149*    Recent Labs    06/24/19 1158  NA 141  K 3.1*  CL 110  GLUCOSE 122*  BUN 28*  CALCIUM 8.6*  CREATININE 2.26*     Results for orders placed or performed during the hospital encounter of 06/24/19 (from the past 24 hour(s))  CBC with Differential     Status: Abnormal   Collection Time: 06/24/19 11:58 AM  Result Value Ref Range   WBC 9.4 4.0 - 10.5 K/uL   RBC 4.17 (L) 4.22 - 5.81 MIL/uL   Hemoglobin 13.0 13.0 - 17.0 g/dL   HCT 39.6 39.0 - 52.0 %   MCV 95.0 80.0 - 100.0 fL   MCH 31.2 26.0 - 34.0 pg   MCHC 32.8 30.0 - 36.0 g/dL   RDW 13.9 11.5 - 15.5 %   Platelets 149 (L) 150  - 400 K/uL   nRBC 0.0 0.0 - 0.2 %   Neutrophils Relative % 91 %   Neutro Abs 8.5 (H) 1.7 - 7.7 K/uL   Lymphocytes Relative 2 %   Lymphs Abs 0.2 (L) 0.7 - 4.0 K/uL   Monocytes Relative 6 %   Monocytes Absolute 0.6 0.1 - 1.0 K/uL   Eosinophils Relative 0 %   Eosinophils Absolute 0.0 0.0 - 0.5 K/uL   Basophils Relative 0 %   Basophils Absolute 0.0 0.0 - 0.1 K/uL   WBC Morphology INCREASED BANDS (>20% BANDS)    Immature Granulocytes 1 %   Abs Immature Granulocytes 0.08 (H) 0.00 - 0.07 K/uL  Comprehensive metabolic panel     Status: Abnormal   Collection Time: 06/24/19 11:58 AM  Result Value Ref Range   Sodium 141 135 - 145 mmol/L   Potassium 3.1 (L) 3.5 - 5.1 mmol/L   Chloride 110 98 - 111 mmol/L   CO2 17 (L) 22 - 32 mmol/L   Glucose, Bld 122 (H) 70 - 99 mg/dL   BUN 28 (H) 8 - 23 mg/dL   Creatinine, Ser 2.26 (H) 0.61 - 1.24 mg/dL   Calcium 8.6 (L) 8.9 - 10.3 mg/dL   Total Protein 6.7 6.5 - 8.1 g/dL   Albumin 3.4 (L) 3.5 - 5.0 g/dL   AST 41 15 - 41 U/L   ALT 19 0 - 44 U/L   Alkaline Phosphatase 59 38 - 126 U/L   Total Bilirubin 2.0 (H) 0.3 - 1.2 mg/dL   GFR calc non Af Amer 27 (L) >60 mL/min   GFR calc Af Amer 31 (L) >60 mL/min   Anion gap 14 5 - 15  Lipase, blood     Status: None   Collection Time: 06/24/19 11:58 AM  Result Value Ref Range   Lipase 20 11 - 51 U/L  Lactic acid, plasma     Status: Abnormal   Collection Time: 06/24/19 11:58 AM  Result Value Ref Range   Lactic Acid, Venous 5.6 (HH) 0.5 - 1.9 mmol/L  Troponin I (High Sensitivity)     Status: Abnormal   Collection Time: 06/24/19 11:58 AM  Result Value Ref Range   Troponin I (High Sensitivity) 371 (HH) <18 ng/L  Protime-INR  Status: Abnormal   Collection Time: 06/24/19 11:58 AM  Result Value Ref Range   Prothrombin Time 24.4 (H) 11.4 - 15.2 seconds   INR 2.2 (H) 0.8 - 1.2  SARS Coronavirus 2 [facility] order, Performed in [facility] hospital lab) Nasopharyngeal Nasopharyngeal Swab     Status: None    Collection Time: 06/24/19 11:59 AM   Specimen: Nasopharyngeal Swab  Result Value Ref Range   SARS Coronavirus 2 NEGATIVE NEGATIVE  Lactic acid, plasma     Status: Abnormal   Collection Time: 06/24/19  2:00 PM  Result Value Ref Range   Lactic Acid, Venous 3.0 (HH) 0.5 - 1.9 mmol/L  Troponin I (High Sensitivity)     Status: Abnormal   Collection Time: 06/24/19  2:00 PM  Result Value Ref Range   Troponin I (High Sensitivity) 496 (HH) <18 ng/L  Urinalysis, Routine w reflex microscopic     Status: Abnormal   Collection Time: 06/24/19  2:30 PM  Result Value Ref Range   Color, Urine YELLOW YELLOW   APPearance HAZY (A) CLEAR   Specific Gravity, Urine 1.016 1.005 - 1.030   pH 5.0 5.0 - 8.0   Glucose, UA NEGATIVE NEGATIVE mg/dL   Hgb urine dipstick LARGE (A) NEGATIVE   Bilirubin Urine NEGATIVE NEGATIVE   Ketones, ur NEGATIVE NEGATIVE mg/dL   Protein, ur 30 (A) NEGATIVE mg/dL   Nitrite POSITIVE (A) NEGATIVE   Leukocytes,Ua SMALL (A) NEGATIVE   RBC / HPF 11-20 0 - 5 RBC/hpf   WBC, UA 6-10 0 - 5 WBC/hpf   Bacteria, UA MANY (A) NONE SEEN   Squamous Epithelial / LPF 0-5 0 - 5   Mucus PRESENT    Granular Casts, UA PRESENT    Recent Results (from the past 240 hour(s))  SARS Coronavirus 2 [facility] order, Performed in [facility] hospital lab) Nasopharyngeal Nasopharyngeal Swab     Status: None   Collection Time: 06/24/19 11:59 AM   Specimen: Nasopharyngeal Swab  Result Value Ref Range Status   SARS Coronavirus 2 NEGATIVE NEGATIVE Final    Comment: (NOTE) If result is NEGATIVE SARS-CoV-2 target nucleic acids are NOT DETECTED. The SARS-CoV-2 RNA is generally detectable in upper and lower  respiratory specimens during the acute phase of infection. The lowest  concentration of SARS-CoV-2 viral copies this assay can detect is 250  copies / mL. A negative result does not preclude SARS-CoV-2 infection  and should not be used as the sole basis for treatment or other  patient management  decisions.  A negative result may occur with  improper specimen collection / handling, submission of specimen other  than nasopharyngeal swab, presence of viral mutation(s) within the  areas targeted by this assay, and inadequate number of viral copies  (<250 copies / mL). A negative result must be combined with clinical  observations, patient history, and epidemiological information. If result is POSITIVE SARS-CoV-2 target nucleic acids are DETECTED. The SARS-CoV-2 RNA is generally detectable in upper and lower  respiratory specimens dur ing the acute phase of infection.  Positive  results are indicative of active infection with SARS-CoV-2.  Clinical  correlation with patient history and other diagnostic information is  necessary to determine patient infection status.  Positive results do  not rule out bacterial infection or co-infection with other viruses. If result is PRESUMPTIVE POSTIVE SARS-CoV-2 nucleic acids MAY BE PRESENT.   A presumptive positive result was obtained on the submitted specimen  and confirmed on repeat testing.  While 2019 novel coronavirus  (  SARS-CoV-2) nucleic acids may be present in the submitted sample  additional confirmatory testing may be necessary for epidemiological  and / or clinical management purposes  to differentiate between  SARS-CoV-2 and other Sarbecovirus currently known to infect humans.  If clinically indicated additional testing with an alternate test  methodology [phone]) is advised. The SARS-CoV-2 RNA is generally  detectable in upper and lower respiratory sp ecimens during the acute  phase of infection. The expected result is Negative. Fact Sheet for Patients:  StrictlyIdeas.no Fact Sheet for Healthcare Providers: BankingDealers.co.za This test is not yet approved or cleared by the [place] FDA and has been authorized for detection and/or diagnosis of SARS-CoV-2 by FDA under an  Emergency Use Authorization (EUA).  This EUA will remain in effect (meaning this test can be used) for the duration of the COVID-19 declaration under Section 564(b)(1) of the Act, 21 U.S.C. section 360bbb-3(b)(1), unless the authorization is terminated or revoked sooner. Performed at [facility], [place] [address], [place], [place] [address]     Renal Function: Recent Labs    06/24/19 1158  CREATININE 2.26*   CrCl cannot be calculated (Unknown ideal weight.).  Radiologic Imaging: Ct Abdomen Pelvis Wo Contrast  Result Date: 06/24/2019 CLINICAL DATA:  78 year old male with recent history of abdominal pain. EXAM: CT CHEST, ABDOMEN AND PELVIS WITHOUT CONTRAST TECHNIQUE: Multidetector CT imaging of the chest, abdomen and pelvis was performed following the standard protocol without IV contrast. COMPARISON:  No priors. FINDINGS: CT CHEST FINDINGS Cardiovascular: Heart size is mildly enlarged. There is no significant pericardial fluid, thickening or pericardial calcification. There is aortic atherosclerosis, as well as atherosclerosis of the great vessels of the mediastinum and the coronary arteries, including calcified atherosclerotic plaque in the left main, left anterior descending, left circumflex and right coronary arteries. Mediastinum/Nodes: No pathologically enlarged mediastinal or hilar lymph nodes. Please note that accurate exclusion of hilar adenopathy is limited on noncontrast CT scans. Esophagus is unremarkable in appearance. No axillary lymphadenopathy. Lungs/Pleura: Areas of scarring or subsegmental atelectasis in the lower lobes of the lungs bilaterally. A few scattered small pulmonary nodules are noted in the lungs, largest of which is in the right middle lobe (axial image 84 of series 4) measuring 5 mm. No other larger more suspicious appearing pulmonary nodules or masses are noted. No acute consolidative airspace disease. No pleural effusions. Musculoskeletal: Large fatty  attenuation lesion in the left chest wall extending into the left axillary region, incompletely imaged, but measuring at least 22.5 x 14.8 cm. There are no aggressive appearing lytic or blastic lesions noted in the visualized portions of the skeleton. CT ABDOMEN PELVIS FINDINGS Hepatobiliary: Diffuse low attenuation throughout the hepatic parenchyma, indicative of hepatic steatosis. No definite suspicious cystic or solid hepatic lesions are confidently identified on today's noncontrast CT examination. Intermediate attenuation lying dependently in the gallbladder, likely to represent tiny noncalcified gallstones. No findings to suggest an acute cholecystitis at this time. Pancreas: No pancreatic mass. No pancreatic ductal dilatation. No pancreatic or peripancreatic fluid collections or inflammatory changes. Spleen: Unremarkable. Adrenals/Urinary Tract: Nonobstructive calculi in the lower pole collecting system of left kidney measuring up to 4 mm. In addition, there is a 1 cm calculus at the left ureteropelvic junction associated with mild-to-moderate proximal hydronephrosis and extensive perinephric stranding indicating obstruction at this time. No additional calculi are noted in the collecting system of the right kidney, along the course of the right ureter or within the lumen of the urinary bladder. No right hydroureteronephrosis.  Unenhanced appearance of the kidneys is otherwise unremarkable. Urinary bladder is nearly decompressed, but otherwise unremarkable in appearance. Bilateral adrenal glands are normal in appearance. Stomach/Bowel: Unenhanced appearance of the stomach is normal. No pathologic dilatation of small bowel or colon. The appendix is not confidently identified and may be surgically absent. Regardless, there are no inflammatory changes noted adjacent to the cecum to suggest the presence of an acute appendicitis at this time. Vascular/Lymphatic: Aortic atherosclerosis with mild fusiform aneurysmal  dilatation of the infrarenal abdominal aorta which measures up to 3.1 x 3.3 cm. 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 No lymphadenopathy noted in the abdomen or pelvis. Reproductive: Prostate gland and seminal vesicles are unremarkable in appearance. Other: No significant volume of ascites.  No pneumoperitoneum. Musculoskeletal: There are no aggressive appearing lytic or blastic lesions noted in the visualized portions of the skeleton. IMPRESSION: 1. 1 cm calculus at the left ureteropelvic junction with mild to moderate proximal left hydronephrosis and extensive perinephric stranding indicative of obstruction. 2. No acute findings noted in the thorax. 3. 2 additional nonobstructive calculi in the lower pole collecting system of left kidney measuring up to 4 mm. 4. Aortic atherosclerosis, in addition to left main and 3 vessel coronary artery disease. Assessment for potential risk factor modification, dietary therapy or pharmacologic therapy may be warranted, if clinically indicated. 5. Probable cholelithiasis without evidence of acute cholecystitis at this time. 6. Mild cardiomegaly. 7. 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 Electronically Signed   By: Vinnie Langton M.D.   On: 06/24/2019 15:16   Ct Chest Wo Contrast  Result Date: 06/24/2019 CLINICAL DATA:  78 year old male with recent history of abdominal pain. EXAM: CT CHEST, ABDOMEN AND PELVIS WITHOUT CONTRAST TECHNIQUE: Multidetector CT imaging of the chest, abdomen and pelvis was performed following the standard protocol without IV contrast. COMPARISON:  No priors. FINDINGS: CT CHEST FINDINGS Cardiovascular: Heart size is mildly enlarged. There is no significant pericardial fluid, thickening or pericardial calcification. There is aortic atherosclerosis, as well as atherosclerosis of the great vessels of the mediastinum and the coronary arteries,  including calcified atherosclerotic plaque in the left main, left anterior descending, left circumflex and right coronary arteries. Mediastinum/Nodes: No pathologically enlarged mediastinal or hilar lymph nodes. Please note that accurate exclusion of hilar adenopathy is limited on noncontrast CT scans. Esophagus is unremarkable in appearance. No axillary lymphadenopathy. Lungs/Pleura: Areas of scarring or subsegmental atelectasis in the lower lobes of the lungs bilaterally. A few scattered small pulmonary nodules are noted in the lungs, largest of which is in the right middle lobe (axial image 84 of series 4) measuring 5 mm. No other larger more suspicious appearing pulmonary nodules or masses are noted. No acute consolidative airspace disease. No pleural effusions. Musculoskeletal: Large fatty attenuation lesion in the left chest wall extending into the left axillary region, incompletely imaged, but measuring at least 22.5 x 14.8 cm. There are no aggressive appearing lytic or blastic lesions noted in the visualized portions of the skeleton. CT ABDOMEN PELVIS FINDINGS Hepatobiliary: Diffuse low attenuation throughout the hepatic parenchyma, indicative of hepatic steatosis. No definite suspicious cystic or solid hepatic lesions are confidently identified on today's noncontrast CT examination. Intermediate attenuation lying dependently in the gallbladder, likely to represent tiny noncalcified gallstones. No findings to suggest an acute cholecystitis at this time. Pancreas: No pancreatic mass. No pancreatic ductal dilatation. No pancreatic or peripancreatic fluid collections or  inflammatory changes. Spleen: Unremarkable. Adrenals/Urinary Tract: Nonobstructive calculi in the lower pole collecting system of left kidney measuring up to 4 mm. In addition, there is a 1 cm calculus at the left ureteropelvic junction associated with mild-to-moderate proximal hydronephrosis and extensive perinephric stranding indicating  obstruction at this time. No additional calculi are noted in the collecting system of the right kidney, along the course of the right ureter or within the lumen of the urinary bladder. No right hydroureteronephrosis. Unenhanced appearance of the kidneys is otherwise unremarkable. Urinary bladder is nearly decompressed, but otherwise unremarkable in appearance. Bilateral adrenal glands are normal in appearance. Stomach/Bowel: Unenhanced appearance of the stomach is normal. No pathologic dilatation of small bowel or colon. The appendix is not confidently identified and may be surgically absent. Regardless, there are no inflammatory changes noted adjacent to the cecum to suggest the presence of an acute appendicitis at this time. 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 No lymphadenopathy noted in the abdomen or pelvis. Reproductive: Prostate gland and seminal vesicles are unremarkable in appearance. Other: No significant volume of ascites.  No pneumoperitoneum. Musculoskeletal: There are no aggressive appearing lytic or blastic lesions noted in the visualized portions of the skeleton. IMPRESSION: 1. 1 cm calculus at the left ureteropelvic junction with mild to moderate proximal left hydronephrosis and extensive perinephric stranding indicative of obstruction. 2. No acute findings noted in the thorax. 3. 2 additional nonobstructive calculi in the lower pole collecting system of left kidney measuring up to 4 mm. 4. Aortic atherosclerosis, in addition to left main and 3 vessel coronary artery disease. Assessment for potential risk factor modification, dietary therapy or pharmacologic therapy may be warranted, if clinically indicated. 5. Probable cholelithiasis without evidence of acute cholecystitis at this  time. 6. Mild cardiomegaly. 7. 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 Electronically Signed   By: Vinnie Langton M.D.   On: 06/24/2019 15:16   Dg Chest Portable 1 View  Result Date: 06/24/2019 CLINICAL DATA:  Shortness of breath. EXAM: PORTABLE CHEST 1 VIEW COMPARISON:  None. FINDINGS: Lung volumes are low but the lungs are clear. Heart size is upper normal. No pneumothorax or pleural fluid. No acute or focal bony abnormality. IMPRESSION: No acute disease. Electronically Signed   By: Inge Rise M.D.   On: 06/24/2019 12:22    I independently reviewed the above imaging studies.  Impression/Recommendation 78 year old male with multiple medical comorbidities and severe sepsis due to a 1 cm left UPJ calculus causing left-sided hydronephrosis and acute kidney injury.  -Due to his cardiopulmonary lability, interventional radiology has been consulted to place a left-sided percutaneous nephrostomy tube.  The risk of bleeding complications was discussed at length with the patient and his daughter and they voiced understanding and wished to proceed. -Blood and urine cultures are pending.  Continue broad-spectrum antibiotic coverage with cefepime -Recommend Foley catheter placement to monitor urine output   Ellison Hughs, MD [facility] 06/24/2019, 4:50 PM

## 2019-06-24 NOTE — ED Notes (Signed)
Pt placed in hospital bed

## 2019-06-24 NOTE — ED Notes (Signed)
Patient transported to IR 

## 2019-06-24 NOTE — Consult Note (Signed)
NAME:  Patrick Bishop, MRN:  [id number], DOB:  16-May-1941, LOS: 0 ADMISSION DATE:  06/24/2019, CONSULTATION DATE:  06/24/19 REFERRING MD:  Dr. Lorin Mercy, CHIEF COMPLAINT:  urosepsis  Brief History   78 year old male with one day history of progressive left flank pain. Found to be septic with 1cm stone in left UPJ.   History of present illness   One day history of progressive left flank pain and fever (Tmax 103F). Patient presented to ED with daughter. Was found to be hypotensive (SBPs in 70s), tachycardic and hypoxic up arrival. CT AP found 1cm stone lodged in left UPJ with associated hydronephrosis. Due to hemodynamic instability, patient proceeded to IR for nephrotomy tube placement.  Pt was also noted to have elevated troponins 371-->496. CT chest significant for left main and 3 vessel CAD and mild cardiomegaly.  Past Medical History  HFpEF HTN A fib OSA Pulm hypertension  Consults:  Cardiology IR PCCM  Procedures:  9/28 left nephrotomy tube  Significant Diagnostic Tests:  9/28 CT AP: 1 cm calculus at the left ureteropelvic junction with mild to moderate proximal left hydronephrosis and extensive perinephric stranding indicative of obstruction. 2 additional nonobstructive calculi in the lower pole collecting system of left kidney measuring up to 4 mm. Aortic atherosclerosis, in addition to left main and 3 vessel coronary artery disease 9/28 CXR and CT chest: neg for findings consistent with pna 9/28 EKG: in a fib. No ST elevations or T wave depressions in consecutive leads  Micro Data:  9/28 blood cultures>> 9/28 urine culture >> Antimicrobials:  9/28 cefepime>> 9/28 cipro>>  Interim history/subjective:  na  Objective   Blood pressure 102/71, pulse 75, temperature 100.3 F (37.9 C), temperature source Oral, resp. rate (!) 31, height 6\' 1"  (1.854 m), SpO2 95 %.        Intake/Output Summary (Last 24 hours) at 06/24/2019 1802 Last data filed at 06/24/2019 1644 Gross per  24 hour  Intake 2000 ml  Output -  Net 2000 ml   There were no vitals filed for this visit.  Examination: General: acutely ill appearing. HENT: [place] on. Nares patent. Lungs: tachypnea. No wheezes appreciated. Cardiovascular: tachycardic rate. irreg rhythm. +++lower extremity edema Abdomen: diffusely tender. bs active Extremities: warm. Stasis dermatitis of lower extremities Neuro: alert and oriented. GU: foley cath  Resolved Hospital Problem list   none  Assessment & Plan:   Urosepsis 2/2 nephrolithiasis. AKI 1d hx of progressive left sided flank pain and fever (Tmax 103). Upon arrive to ED, pt noted to be hypotensive, tachycardic and hypoxic. -Urology on board -left shift without leukocytosis. LA 5.6-->3 -CT AP--1cm left UPJ calculus with hydronephrosis and perinephric stranding.   -Urine/BC pending -UA +nitrites +blood +bacteria  -Cr 2.3, GFR 27 -Hypokalemia--3.1 on admission Plan Continue cefepime and cipro. Can de-escalate pending culture results. Management per primary Continue IVF Repeat BMP in am Replace K per primary  Respiratory failure with hypoxia. Requiring 6L [place] to maintain sats. No concerning findings of edema or infiltrate on CXR at this time. Continue to monitor.  Left main + 3 vessel CAD found seen on CT chest. Chronic diastolic heart failure. Pulm hypertension. Last echo 2018 significant for EF 60-65%, LVH and pulm hypertension. Persistent afib. CHADVASC 5. PAD s/p bilateral iliac and femoral stent placement 2015/2016 Elevated troponins. Likely 2/2 demand.  No EKG findings concerning for ACS.  Holding xarelto due to neph tube placement. Possible echo tomorrow. Hold lasix and antihypertensives due to hypotension. Management per cards.  Best practice:  Diet: NPO DVT prophylaxis: holding xarelto for nephrotomy tube placement. May resume when ok'd by IR. Mobility: as tol Code Status: Full Family Communication: daughter Disposition:  medicine   Labs   CBC: Recent Labs  Lab 06/24/19 1158  WBC 9.4  NEUTROABS 8.5*  HGB 13.0  HCT 39.6  MCV 95.0  PLT 149*    Basic Metabolic Panel: Recent Labs  Lab 06/24/19 1158  NA 141  K 3.1*  CL 110  CO2 17*  GLUCOSE 122*  BUN 28*  CREATININE 2.26*  CALCIUM 8.6*   GFR: CrCl cannot be calculated (Unknown ideal weight.). Recent Labs  Lab 06/24/19 1158 06/24/19 1400  WBC 9.4  --   LATICACIDVEN 5.6* 3.0*    Liver Function Tests: Recent Labs  Lab 06/24/19 1158  AST 41  ALT 19  ALKPHOS 59  BILITOT 2.0*  PROT 6.7  ALBUMIN 3.4*   Recent Labs  Lab 06/24/19 1158  LIPASE 20   No results for input(s): AMMONIA in the last 168 hours.  ABG No results found for: PHART, PCO2ART, PO2ART, HCO3, TCO2, ACIDBASEDEF, O2SAT   Coagulation Profile: Recent Labs  Lab 06/24/19 1158  INR 2.2*    Cardiac Enzymes: No results for input(s): CKTOTAL, CKMB, CKMBINDEX, TROPONINI in the last 168 hours.  HbA1C: No results found for: HGBA1C  CBG: No results for input(s): GLUCAP in the last 168 hours.  Review of Systems:   Review of Systems  Constitutional: Positive for chills, fever and malaise/fatigue.  HENT: Negative for sore throat.   Eyes: Negative for blurred vision.  Respiratory: Positive for shortness of breath. Negative for cough.   Cardiovascular: Negative for chest pain.  Gastrointestinal: Positive for abdominal pain, nausea and vomiting.  Genitourinary: Positive for dysuria and flank pain.  Musculoskeletal: Positive for myalgias.  Neurological: Positive for dizziness.  Psychiatric/Behavioral: Negative for depression.     Past Medical History  He,  has a past medical history of Chronic diastolic heart failure ([place]), Chronic venous insufficiency (2015), Essential hypertension, Hyperlipidemia with target LDL less than 100, Longstanding persistent atrial fibrillation, Obesity, OSA on CPAP, and Pulmonary hypertension ([place]).   Surgical History    Past Surgical  History:  Procedure Laterality Date  . ABDOMINAL VENOUS DUPLEX Bilateral 07/2014   ([place] Cardiology: Dr. Cleda Mccreedy): (prior to Bilateral Iliac-Femoral Venous Stent Placement): Patent IVC.  No DVT B/L Iliac or Com fem V..  +++ DEEP V REFLUX B/L ILIAC & FEM V.  Absent Superficial V Reflux B/L x L SSV (too tortuous for intervention).  BL GSV occluded - prior ablation.   . ABDOMINAL VENOUS DUPLEX Bilateral 11/2014   Absent DVT.  Left common femoral venous reflux: 1508 MS left common femoral vein, 2284MS left proximal femoral vein -> cystic structure noted in the left popliteal vein (7.6 x 1.4 x 3.8 cm).;  Right leg deep venous reflux noted in the distal iliac, common femoral, proximal profunda femoral, proximal superficial femoral, superficial femoral and popliteal vein.  [name] CARDIAC CATHETERIZATION  10/2009   [facility] - Dr. 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  EF~45%.  Mild pulmonary pretension.  [name] CARDIAC PET  08/2009   Inferolateral partially reversible defect -- > false positive by cath  . GREATER SAPHENOUS VEIN ABLATION Bilateral Before 2015   [facility] (Dr. Eugenie Filler)  . IR TRANSCATH PLC STENT  EA ADD VEIN  INC ANGIOPLASTY Left 10/2014   [facility], Dr. 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 IR TRANSCATH PLC STENT  INITIAL VEIN  INC ANGIOPLASTY Right 08/2014   [facility] - Dr. Stormy Fabian): US Guided --> R Common & External Iliac, Common Femoral Venography with Placement of 24 mmx 70 mm Stent - R Com Iliac-Ext Iliac- & Com Fem V. reducing 60-80% venous compression to ~0%).;; also noted significant L-sided venous compression (staged stenting).   [name] NM MYOVIEW LTD  11/2005   Adenosine Myoview [facility] - Dr.  Cleda Mccreedy): Normal myocardial perfusion scan. No ischemia or infarction.  . TRANSTHORACIC ECHOCARDIOGRAM  04/26/2017    [facility] - Dr. Cleda Mccreedy) - report not available.  Per clinic note: Normal global LV function. Mild concentric temperature. Mild TR. Mild pulmonary hypertension.;;   . TRANSTHORACIC ECHOCARDIOGRAM  7/'15; 7/'17   [facility] - Dr. Saverio Danker) a) low normal LV function (EF 56%). Moderate concentric LVH. Mild LA dilation. Normal pulmonary pressures.  ;; b) normal global function - EF 60-65%. Moderate pulmonary hypertension. Biatrial enlargement. Moderate concentric LVH.  [name] TRANSTHORACIC ECHOCARDIOGRAM  02/2010   [facility] - Dr. Saverio Danker): Normal global LV function. Mild LVH. EF 60%.; November 2010. Moderate LV dilation. Moderate LVH. Normal EF 50-55%. Mild to moderate pulmonary hypertension. (July 2012 showed normal function with mild LVH, mild MR noted in August 2014)     Social History   reports that he quit smoking about 24 years ago. His smoking use included cigarettes. He has a 35.00 pack-year smoking history. He has never used smokeless tobacco. He reports that he does not drink alcohol or use drugs.   Family History   His family history includes Cancer in his mother and sister; Heart attack (age of onset: 59) in his father; Hypertension in his brother. There is no history of Other.   Allergies Allergies  Allergen Reactions  . Aldactone [Spironolactone]     - unknown      Home Medications  Prior to Admission medications   Medication Sig Start Date End Date Taking? Authorizing Provider  amLODipine (NORVASC) 10 MG tablet TAKE 1 TABLET DAILY Patient taking differently: Take 10 mg by mouth daily.  06/04/19  Yes [name], MD  atorvastatin (LIPITOR) 10 MG tablet TAKE 1 TABLET DAILY AT 6 P.M. Patient taking differently: Take 10 mg by mouth daily.  11/30/18  Yes [name], MD  furosemide (LASIX) 40 MG tablet Take  20 mg  ( 1/2  tablet) twice a day except on Monday and Thursday take 40 mg twice a day. Patient taking differently: Take 20-40 mg by mouth See admin instructions. Take  20 mg  ( 1/2 tablet) twice a day except on Monday and Thursday take 40 mg twice a day. 07/02/18  Yes [name], MD  HYDROcodone-acetaminophen (NORCO/VICODIN) 5-325 MG tablet Take 1 tablet by mouth 2 (two) times daily.  09/04/17  Yes [provider]  metoprolol tartrate (LOPRESSOR) 25 MG tablet TAKE ONE TABLET (25 MG TOTAL) EVERY MORNING AND ONE AND ONE-HALF TABLETS (37.5 MG TOTAL) EVERY EVENING Patient taking differently: Take 25-37.5 mg by mouth See admin instructions. Take 25mg  in the morning and 37.5mg  in the evening. 04/19/19  Yes [name], MD  XARELTO 20 MG TABS tablet TAKE 1 TABLET DAILY WITH SUPPER Patient  taking differently: Take 20 mg by mouth daily with supper.  11/09/18  Yes [name], MD     Mitzi Hansen, [employer] PGY-1 06/24/19 6:02 PM

## 2019-06-24 NOTE — H&P (Signed)
History and Physical    Patrick Bishop GYJ:[id number] DOB: [birth year] DOA: 06/24/2019  PCP: Lujean Amel, MD Consultants:  Ellyn Hack - cardiology; March Rummage - podiatry; Donzetta Matters - vascular surgery Patient coming from: [place]; NOK: Daughter, [phone]  Chief Complaint:  Abdominal pain  HPI: Patrick Bishop is a 78 y.o. male with medical history significant of OSA on CPAP; pulmonary HTN; obesity; afib; HTN; HLD; PVD s/p stents; and chronic diastolic CHF presenting with abdominal pain.  He reports continuous suprapubic pain without other urinary symptoms.  ?fever at home, some chills.  He developed n/v a couple of days ago and has been unable to take his meds.  He felt SOB today.   ED Course:  Sick - SOB and abdominal pain.  88% on RA, placed on 4L.  Has afib, unable to take meds due to vomiting.  Appears to have sepsis from a urinary source with an obstructing stone.  Lactate 5.6, down to 3.  HS troponin 500.  AKI.  Dr. Lovena Neighbours - needs to go to the OR for a stent, cannot go to [facility].  Cards will also see.   Likely ok for SDU.  Review of Systems: As per HPI; otherwise review of systems reviewed and negative.   Ambulatory Status:  Ambulates with a walker  Past Medical History:  Diagnosis Date   Chronic diastolic heart failure ([place])    Normal LV Fxn by Echo 04/2017    Chronic venous insufficiency 2015   s/p Bilateral Iliac & Femoral Stents (including IVC) - DEEP Venous Reflux.  Also s/p Bilateral GSV Ablation.   Essential hypertension    Hyperlipidemia with target LDL less than 100    Longstanding persistent atrial fibrillation    Obesity    OSA on CPAP    May need New CPAP machine & CPAP MD)   Pulmonary hypertension [facility])     Past Surgical History:  Procedure Laterality Date   ABDOMINAL VENOUS DUPLEX Bilateral 07/2014   ([place] Cardiology: Dr. Cleda Mccreedy): (prior to Bilateral Iliac-Femoral Venous Stent Placement): Patent IVC.  No DVT B/L Iliac or Com fem V..   +++ DEEP V REFLUX B/L ILIAC & FEM V.  Absent Superficial V Reflux B/L x L SSV (too tortuous for intervention).  BL GSV occluded - prior ablation.    ABDOMINAL VENOUS DUPLEX Bilateral 11/2014   Absent DVT.  Left common femoral venous reflux: 1508 MS left common femoral vein, 2284MS left proximal femoral vein -> cystic structure noted in the left popliteal vein (7.6 x 1.4 x 3.8 cm).;  Right leg deep venous reflux noted in the distal iliac, common femoral, proximal profunda femoral, proximal superficial femoral, superficial femoral and popliteal vein.   CARDIAC CATHETERIZATION  10/2009   [facility] Cardiology - Dr. 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  EF~45%.  Mild pulmonary pretension.   CARDIAC PET  08/2009   Inferolateral partially reversible defect -- > false positive by cath   [place] Bilateral Before 2015   [place] [facility] (Dr. Eugenie Filler)   IR [facility]  EA ADD VEIN  INC ANGIOPLASTY Left 10/2014   [phone], Dr. 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 Stormy Fabian): US Guided --> R Common & External Iliac, Common Femoral Venography with Placement of 24 mmx 70 mm Stent - R Com Iliac-Ext Iliac- & Com Fem V. reducing 60-80% venous compression to ~0%).;; also noted significant L-sided venous compression (staged stenting).    NM MYOVIEW LTD  11/2005   Adenosine Myoview [facility]iology - Dr. Cleda Mccreedy): Normal myocardial perfusion scan. No ischemia or infarction.   TRANSTHORACIC ECHOCARDIOGRAM  04/26/2017    [facility] Cardiology - Dr. Cleda Mccreedy) - report not available.  Per clinic note: Normal global LV function.  Mild concentric temperature. Mild TR. Mild pulmonary hypertension.;;    TRANSTHORACIC ECHOCARDIOGRAM  7/'15; 7/'17   [facility] Cardiology - Dr. Saverio Danker) a) low normal LV function (EF 56%). Moderate concentric LVH. Mild LA dilation. Normal pulmonary pressures.  ;; b) normal global function - EF 60-65%. Moderate pulmonary hypertension. Biatrial enlargement. Moderate concentric LVH.   TRANSTHORACIC ECHOCARDIOGRAM  02/2010   [facility] Cardiology - Dr. Saverio Danker): Normal global LV function. Mild LVH. EF 60%.; November 2010. Moderate LV dilation. Moderate LVH. Normal EF 50-55%. Mild to moderate pulmonary hypertension. 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   Occupation: Retired    Comment: After several odd jobs  Scientist, product/process development strain: Not on Training and development officer insecurity    Worry: Not on file    Inability: Not on Lexicographer needs    Medical: Not on file    Non-medical: Not on file  Tobacco Use   Smoking status: Former Smoker    Packs/day: 1.00    Years: 35.00    Pack years: 35.00    Types: Cigarettes    Quit date: 1996    Years since quitting: 24.7   Smokeless tobacco: Never Used   Tobacco comment: smoked from age [age]   Substance and Sexual Activity   Alcohol use: No    Frequency: Never    Comment: Not any more   Drug use: No   Sexual activity: Not Currently  Lifestyle   Physical activity    Days per week: Not on file    Minutes per session: Not on file   Stress: Not on file  Relationships    Social connections    Talks on phone: Not on file    Gets together: Not on file    Attends religious service: Not on file    Active member of club or organization: Not on file    Attends meetings of clubs or organizations: Not on file    Relationship status: Not on file   Intimate partner violence    Fear of current or ex partner: Not on file    Emotionally abused: Not on file    Physically abused: Not on file    Forced sexual activity: Not on file  Other Topics Concern   Not on file  Social History Narrative   Patrick Bishop  recently moved to [place] in January 2019.  This was to be close to his daughter who is here with him today.  He has 2 daughters and one grandchild.      Jacqualine Code,  [place] --PCP was Sherlynn Carbon, MD.  Cardiologist: Saverio Danker, MD (who followed him every 3 months with routine visits.  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   He currently lives at [place] (Dr. Albesa Seen) --however he hopes to be moving into his daughter's house once they have not established and ready for him.    Allergies  Allergen Reactions   Aldactone [Spironolactone]     - unknown     Family History  Problem Relation Age of Onset   Cancer Mother    Heart attack Father 82   Hypertension Brother    Cancer Sister    Other Neg Hx        He really does not know much about his parents and siblings health, but they did not speak about healthcare    Prior to Admission medications   Medication Sig Start Date End Date Taking? Authorizing Provider  amLODipine (NORVASC) 10 MG tablet TAKE 1 TABLET DAILY Patient taking differently: Take 10 mg by mouth daily.  06/04/19  Yes [name], MD  atorvastatin (LIPITOR) 10 MG tablet TAKE 1 TABLET DAILY AT 6 P.M. Patient taking differently: Take 10 mg by mouth daily.  11/30/18  Yes [name], MD  furosemide (LASIX)  40 MG tablet Take  20 mg  ( 1/2 tablet) twice a day except on Monday and Thursday take 40 mg twice a day. Patient taking differently: Take 20-40 mg by mouth See admin instructions. Take  20 mg  ( 1/2 tablet) twice a day except on Monday and Thursday take 40 mg twice a day. 07/02/18  Yes [name], MD  HYDROcodone-acetaminophen (NORCO/VICODIN) 5-325 MG tablet Take 1 tablet by mouth 2 (two) times daily.  09/04/17  Yes [provider]  metoprolol tartrate (LOPRESSOR) 25 MG tablet TAKE ONE TABLET (25 MG TOTAL) EVERY MORNING AND ONE AND ONE-HALF TABLETS (37.5 MG TOTAL) EVERY EVENING Patient taking differently: Take 25-37.5 mg by mouth See admin instructions. Take 25mg  in the morning and 37.5mg  in the evening. 04/19/19  Yes [name], MD  XARELTO 20 MG TABS tablet TAKE 1 TABLET DAILY WITH SUPPER Patient taking differently: Take 20 mg by mouth daily with supper.  11/09/18  Yes [name], MD    Physical Exam: Vitals:   06/24/19 1719 06/24/19 1725 06/24/19 1730 06/24/19 1735  BP: (!) 99/51 104/61 106/81 102/71  Pulse: 78 88 85 75  Resp: 20 (!) 32 (!) 31 (!) 31  Temp:      TempSrc:      SpO2: 95% 95% 95% 95%  Height:          General:  Appears calm and comfortable and is NAD, MAP 61  Eyes:  EOMI, normal lids, iris  ENT:  Hard of hearing, lips & tongue, dry mm; edentulous  Neck:  no LAD, masses or thyromegaly; no carotid bruits  Cardiovascular:  RRR, no m/r/g. 3+ chronic LE edema.   Respiratory:   CTA bilaterally with no wheezes/rales/rhonchi.  Mildly increased respiratory effort.  Abdomen:  soft, NT, ND, NABS  Skin:  no rash or induration seen on limited exam other than stasis dermatitis of B lower  legs  Musculoskeletal:  grossly normal tone BUE/BLE, good ROM, no bony abnormality  Psychiatric:  grossly normal mood and affect, speech fluent and appropriate, AOx3  Neurologic:  CN 2-12 grossly intact, moves all extremities in coordinated fashion, sensation  intact    Radiological Exams on Admission: Ct Abdomen Pelvis Wo Contrast  Result Date: 06/24/2019 CLINICAL DATA:  78 year old male with recent history of abdominal pain. EXAM: CT CHEST, ABDOMEN AND PELVIS WITHOUT CONTRAST TECHNIQUE: Multidetector CT imaging of the chest, abdomen and pelvis was performed following the standard protocol without IV contrast. COMPARISON:  No priors. FINDINGS: CT CHEST FINDINGS Cardiovascular: Heart size is mildly enlarged. There is no significant pericardial fluid, thickening or pericardial calcification. There is aortic atherosclerosis, as well as atherosclerosis of the great vessels of the mediastinum and the coronary arteries, including calcified atherosclerotic plaque in the left main, left anterior descending, left circumflex and right coronary arteries. Mediastinum/Nodes: No pathologically enlarged mediastinal or hilar lymph nodes. Please note that accurate exclusion of hilar adenopathy is limited on noncontrast CT scans. Esophagus is unremarkable in appearance. No axillary lymphadenopathy. Lungs/Pleura: Areas of scarring or subsegmental atelectasis in the lower lobes of the lungs bilaterally. A few scattered small pulmonary nodules are noted in the lungs, largest of which is in the right middle lobe (axial image 84 of series 4) measuring 5 mm. No other larger more suspicious appearing pulmonary nodules or masses are noted. No acute consolidative airspace disease. No pleural effusions. Musculoskeletal: Large fatty attenuation lesion in the left chest wall extending into the left axillary region, incompletely imaged, but measuring at least 22.5 x 14.8 cm. There are no aggressive appearing lytic or blastic lesions noted in the visualized portions of the skeleton. CT ABDOMEN PELVIS FINDINGS Hepatobiliary: Diffuse low attenuation throughout the hepatic parenchyma, indicative of hepatic steatosis. No definite suspicious cystic or solid hepatic lesions are confidently identified  on today's noncontrast CT examination. Intermediate attenuation lying dependently in the gallbladder, likely to represent tiny noncalcified gallstones. No findings to suggest an acute cholecystitis at this time. Pancreas: No pancreatic mass. No pancreatic ductal dilatation. No pancreatic or peripancreatic fluid collections or inflammatory changes. Spleen: Unremarkable. Adrenals/Urinary Tract: Nonobstructive calculi in the lower pole collecting system of left kidney measuring up to 4 mm. In addition, there is a 1 cm calculus at the left ureteropelvic junction associated with mild-to-moderate proximal hydronephrosis and extensive perinephric stranding indicating obstruction at this time. No additional calculi are noted in the collecting system of the right kidney, along the course of the right ureter or within the lumen of the urinary bladder. No right hydroureteronephrosis. Unenhanced appearance of the kidneys is otherwise unremarkable. Urinary bladder is nearly decompressed, but otherwise unremarkable in appearance. Bilateral adrenal glands are normal in appearance. Stomach/Bowel: Unenhanced appearance of the stomach is normal. No pathologic dilatation of small bowel or colon. The appendix is not confidently identified and may be surgically absent. Regardless, there are no inflammatory changes noted adjacent to the cecum to suggest the presence of an acute appendicitis at this time. 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 No lymphadenopathy noted in the abdomen or pelvis. Reproductive: Prostate gland and seminal vesicles are unremarkable in appearance. Other: No significant volume of ascites.  No pneumoperitoneum. Musculoskeletal: There are no aggressive appearing lytic or blastic lesions noted in the  visualized  portions of the skeleton. IMPRESSION: 1. 1 cm calculus at the left ureteropelvic junction with mild to moderate proximal left hydronephrosis and extensive perinephric stranding indicative of obstruction. 2. No acute findings noted in the thorax. 3. 2 additional nonobstructive calculi in the lower pole collecting system of left kidney measuring up to 4 mm. 4. Aortic atherosclerosis, in addition to left main and 3 vessel coronary artery disease. Assessment for potential risk factor modification, dietary therapy or pharmacologic therapy may be warranted, if clinically indicated. 5. Probable cholelithiasis without evidence of acute cholecystitis at this time. 6. Mild cardiomegaly. 7. 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 Electronically Signed   By: Vinnie Langton M.D.   On: 06/24/2019 15:16   Ct Chest Wo Contrast  Result Date: 06/24/2019 CLINICAL DATA:  78 year old male with recent history of abdominal pain. EXAM: CT CHEST, ABDOMEN AND PELVIS WITHOUT CONTRAST TECHNIQUE: Multidetector CT imaging of the chest, abdomen and pelvis was performed following the standard protocol without IV contrast. COMPARISON:  No priors. FINDINGS: CT CHEST FINDINGS Cardiovascular: Heart size is mildly enlarged. There is no significant pericardial fluid, thickening or pericardial calcification. There is aortic atherosclerosis, as well as atherosclerosis of the great vessels of the mediastinum and the coronary arteries, including calcified atherosclerotic plaque in the left main, left anterior descending, left circumflex and right coronary arteries. Mediastinum/Nodes: No pathologically enlarged mediastinal or hilar lymph nodes. Please note that accurate exclusion of hilar adenopathy is limited on noncontrast CT scans. Esophagus is unremarkable in appearance. No axillary lymphadenopathy. Lungs/Pleura: Areas of scarring or subsegmental atelectasis in the lower lobes of the lungs  bilaterally. A few scattered small pulmonary nodules are noted in the lungs, largest of which is in the right middle lobe (axial image 84 of series 4) measuring 5 mm. No other larger more suspicious appearing pulmonary nodules or masses are noted. No acute consolidative airspace disease. No pleural effusions. Musculoskeletal: Large fatty attenuation lesion in the left chest wall extending into the left axillary region, incompletely imaged, but measuring at least 22.5 x 14.8 cm. There are no aggressive appearing lytic or blastic lesions noted in the visualized portions of the skeleton. CT ABDOMEN PELVIS FINDINGS Hepatobiliary: Diffuse low attenuation throughout the hepatic parenchyma, indicative of hepatic steatosis. No definite suspicious cystic or solid hepatic lesions are confidently identified on today's noncontrast CT examination. Intermediate attenuation lying dependently in the gallbladder, likely to represent tiny noncalcified gallstones. No findings to suggest an acute cholecystitis at this time. Pancreas: No pancreatic mass. No pancreatic ductal dilatation. No pancreatic or peripancreatic fluid collections or inflammatory changes. Spleen: Unremarkable. Adrenals/Urinary Tract: Nonobstructive calculi in the lower pole collecting system of left kidney measuring up to 4 mm. In addition, there is a 1 cm calculus at the left ureteropelvic junction associated with mild-to-moderate proximal hydronephrosis and extensive perinephric stranding indicating obstruction at this time. No additional calculi are noted in the collecting system of the right kidney, along the course of the right ureter or within the lumen of the urinary bladder. No right hydroureteronephrosis. Unenhanced appearance of the kidneys is otherwise unremarkable. Urinary bladder is nearly decompressed, but otherwise unremarkable in appearance. Bilateral adrenal glands are normal in appearance. Stomach/Bowel: Unenhanced appearance of the stomach is  normal. No pathologic dilatation of small bowel or colon. The appendix is not confidently identified and may be surgically absent. Regardless, there are no inflammatory changes noted adjacent to the cecum to suggest the presence of an acute appendicitis at this time.  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 No lymphadenopathy noted in the abdomen or pelvis. Reproductive: Prostate gland and seminal vesicles are unremarkable in appearance. Other: No significant volume of ascites.  No pneumoperitoneum. Musculoskeletal: There are no aggressive appearing lytic or blastic lesions noted in the visualized portions of the skeleton. IMPRESSION: 1. 1 cm calculus at the left ureteropelvic junction with mild to moderate proximal left hydronephrosis and extensive perinephric stranding indicative of obstruction. 2. No acute findings noted in the thorax. 3. 2 additional nonobstructive calculi in the lower pole collecting system of left kidney measuring up to 4 mm. 4. Aortic atherosclerosis, in addition to left main and 3 vessel coronary artery disease. Assessment for potential risk factor modification, dietary therapy or pharmacologic therapy may be warranted, if clinically indicated. 5. Probable cholelithiasis without evidence of acute cholecystitis at this time. 6. Mild cardiomegaly. 7. 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 Electronically Signed   By: Vinnie Langton M.D.   On: 06/24/2019 15:16   Dg Chest Portable 1 View  Result Date: 06/24/2019 CLINICAL DATA:  Shortness of breath. EXAM: PORTABLE CHEST 1 VIEW COMPARISON:  None. FINDINGS: Lung volumes are low but the lungs are clear. Heart size is upper normal. No pneumothorax or pleural fluid. No acute or focal bony  abnormality. IMPRESSION: No acute disease. Electronically Signed   By: Inge Rise M.D.   On: 06/24/2019 12:22    EKG: Independently reviewed.  Afib with rate 129; nonspecific ST changes with no evidence of acute ischemia   Labs on Admission: I have personally reviewed the available labs and imaging studies at the time of the admission.  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 -Sepsis protocol initiated -Patient had initial lactate >4 or SBP <90/MAP <65 and so has received the 30 cc/kg IVF bolus for IBW; additional IVF bolus may still be indicated for an additional 2L -Suspected source is UTI with obstructing stone -Blood and urine cultures pending -Will admit to progressive care unit due to: hemodynamic instability; hypoxemia; need for emergent procedure -Treat with IV Cefepime for presumed  urinary source with complicated UTI -Will add HIV -Will trend lactate to ensure improvement -Will order sepsis protocol procalcitonin level.   -  He needs source control, but appears to be too unstable for anesthesia for a cystoscopy and stent placement -As such, IR has been consulted and is planning to place an urgent nephrostomy tube -Urology will also consult on the patient -I have also spoken with PCCM and they will consult; while he currently is mentating well and surprisingly well appearing, he is at significant risk for decompensation with the procedure and due to his septic shock -Foley to monitor UOP, as per urology  Afib with RVR -Patient with known afib -With persistent severe pain and n/v, he appears to have reverted to RVR -This has improved while he has been in the ER and he is now rate controlled -Cardiology has been asked to consult on the patient -Xarelto is held for procedure -Consider Heparin drip vs. resumption of Xarelto when stable from a bleeding standpoint -Acute respiratory failure is likely related to RVR and is anticipated to resolve; no other significant respiratory symptoms and unremarkable evaluation -Continue [place] O2 prn  Elevated troponin -Markedly elevated troponin with increasing delta -While this may be consistent with demand ischemia, there is concern for NSTEMI -Unfortunately, he is not really a candidate for heparin infusion until after the procedure - but it should be reconsidered at that time -He has been on Xarelto at home -Cardiology is consulting, plans for echo tomorrow -Last cath was in 2011(?) in [place] with mild non-obstructive CAD  Acute vs. Chronic renal failure -No known baseline creatinine and so this could be CKD -For now, given sepsis with shock physiology in conjunction with an obstructing stone, will treat as acute -He was given the 30 cc/kg bolus already - but for IBW -He will receive ongoing IVF at 150 cc/hr  HTN -Hold PO  Norvasc and Lopressor in the setting of persistent hypotension  HLD -Continue Lipitor  Chronic diastolic CHF -Last echo appears to have been in 2018 in [place] -Normal EF at that time, no comment about EF -Echo ordered -Hold Lasix  Stasis dermatitis -Marked LE edema which appears to be chronic -Likely to worsen with significant IVF required and ongoing -His daughters reports that he gets skin breakdown and ulceration from SCDs -Will use footpulse for now  PVD s/p stenting -CAD seen on imaging today -Needs CVD RF modification  OSA on CPAP -Continue CPAP  Morbid obesity -BMI 48.8 -Weight loss should be encouraged -Outpatient PCP/bariatric medicine/bariatric surgery f/u encouraged  Cholelithiasis -Incidental finding on imaging today -Outpatient f/u unless this becomes symptomatic   Note: This patient has been tested and is negative for the novel coronavirus COVID-19.   DVT prophylaxis:  Foot pulse - resume Xarelto or Heparin drip when ok by IR Code Status:  Full - confirmed with patient/family Family Communication: Daughter present throughout evaluation  Disposition Plan:  To be determined Consults called: Urology; PCCM; IR; Cardiology Admission status: Admit - It is my clinical opinion that admission to INPATIENT is reasonable and necessary because of the expectation that this patient will require hospital care that crosses at least 2 midnights to treat this condition based on the medical complexity of the problems presented.  Given the aforementioned information, the predictability of an adverse outcome is felt to be significant.   Total critical care time: 75 minutes Critical care time was exclusive of separately billable procedures and treating other patients. Critical care was necessary to treat or prevent imminent or life-threatening deterioration. Critical care was time spent personally by me on the following activities: development of treatment plan with patient and/or  surrogate as well as nursing, discussions with consultants, evaluation of patient's response to treatment, examination of patient, obtaining history from patient or surrogate, ordering and performing treatments and interventions, ordering and review of laboratory studies, ordering and review of radiographic studies, pulse oximetry and re-evaluation of patient's condition.   Karmen Bongo MD [facility]   How to contact the [facility] Attending or Consulting provider [place] or covering provider during after hours [place], for this patient?  1. Check the care team in [facility] and look for a) attending/consulting [facility] provider listed and b) the [facility] team listed 2. Log into [email or web address] and use [place]'s universal password to access. If you do not have the password, please contact the hospital operator. 3. Locate the [facility] provider you are looking for under [facility] and page to a number that you can be directly reached. 4. If you still have difficulty reaching the provider, please page the [facility] (Director on Call) for the Hospitalists listed on amion for assistance.   06/24/2019, 5:43 PM

## 2019-06-24 NOTE — ED Provider Notes (Addendum)
[facility] EMERGENCY DEPARTMENT Provider Note   CSN: [id number] Arrival date & time: 06/24/19  1134     History   Chief Complaint Chief Complaint  Patient presents with   Abdominal Pain    HPI Patrick Bishop is a 78 y.o. male.     Pt presents to the ED today with abdominal pain.  The pt has had lower abdominal pain for the past few days.  He has had n/v as well.  He has been unable to take his meds today.  The pt has a hx of chronic afib on Xarelto.  He also is sob.  EMS said he was 88% on RA.  The pt was put on 4L oxygen via [place].  The pt feels like he is febrile.  CHA2DS2/VAS Stroke Risk Points  Current as of 2 weeks ago     4 >= 2 Points: High Risk  1 - 1.99 Points: Medium Risk  0 Points: Low Risk    This is the only CHA2DS2/VAS Stroke Risk Points available for the past  year.: Last Change: N/A     Details    This score determines the patient's risk of having a stroke if the  patient has atrial fibrillation.       Points Metrics  1 Has Congestive Heart Failure:  Yes    Current as of 2 weeks ago  0 Has Vascular Disease:  No    Current as of 2 weeks ago  1 Has Hypertension:  Yes    Current as of 2 weeks ago  2 Age:  53    Current as of 2 weeks ago  0 Has Diabetes:  No    Current as of 2 weeks ago  0 Had Stroke:  No  Had TIA:  No  Had thromboembolism:  No    Current as of 2 weeks ago  0 Male:  No    Current as of 2 weeks ago              Past Medical History:  Diagnosis Date   Chronic diastolic heart failure (HCC)    Normal LV Fxn by Echo 04/2017    Chronic venous insufficiency 2015   s/p Bilateral Iliac & Femoral Stents (including IVC) - DEEP Venous Reflux.  Also s/p Bilateral GSV Ablation.   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 Cardiology: Dr. Cleda Mccreedy): (prior  to Bilateral Iliac-Femoral Venous Stent Placement): Patent IVC.  No DVT B/L Iliac or Com fem V..  +++ DEEP V REFLUX B/L ILIAC & FEM V.  Absent Superficial V Reflux B/L x L SSV (too tortuous for intervention).  BL GSV occluded - prior ablation.    ABDOMINAL VENOUS DUPLEX Bilateral 11/2014   Absent DVT.  Left common femoral venous reflux: 1508 MS left common femoral vein, 2284MS left proximal femoral vein -> cystic structure noted in the left popliteal vein (7.6 x 1.4 x 3.8 cm).;  Right leg deep venous reflux noted in the distal iliac, common femoral, proximal profunda femoral, proximal superficial femoral, superficial femoral and popliteal vein.   CARDIAC CATHETERIZATION  10/2009   [facility] - Dr. 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  EF~45%.  Mild pulmonary pretension.   CARDIAC PET  08/2009   Inferolateral partially reversible defect -- > false positive by cath   GREATER SAPHENOUS VEIN ABLATION Bilateral Before 2015   [facility] (Dr. Eugenie Filler)   IR [facility] [place]  EA ADD VEIN  INC ANGIOPLASTY  Left 10/2014   [phone], Dr. 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 Stormy Fabian): US Guided --> R Common & External Iliac, Common Femoral Venography with Placement of 24 mmx 70 mm Stent - R Com Iliac-Ext Iliac- & Com Fem V. reducing 60-80% venous compression to ~0%).;; also noted significant L-sided venous compression (staged stenting).    NM MYOVIEW LTD  11/2005   Adenosine Myov[facility] - Dr. Cleda Mccreedy): Normal myocardial perfusion scan. No ischemia or infarction.   TRANSTHORACIC ECHOCARDIOGRAM  04/26/2017    [facility] - Dr. Cleda Mccreedy) - report not available.  Per clinic note: Normal global LV function. Mild concentric temperature. Mild TR. Mild pulmonary hypertension.;;    TRANSTHORACIC ECHOCARDIOGRAM  7/'15; 7/'17   [facility] - Dr. Saverio Danker) a) low normal LV function (EF 56%). Moderate concentric LVH. Mild LA dilation. Normal pulmonary pressures.  ;; b) normal global function - EF 60-65%. Moderate pulmonary hypertension. Biatrial enlargement. Moderate concentric LVH.   TRANSTHORACIC ECHOCARDIOGRAM  02/2010   [facility] - Dr. Saverio Danker): Normal global LV function. Mild LVH. EF 60%.; November 2010. Moderate LV dilation. Moderate LVH. Normal EF 50-55%. Mild to moderate pulmonary hypertension. (July 2012 showed normal function with mild LVH, mild MR noted in August 2014)        Home Medications    Prior to Admission medications   Medication Sig Start Date End Date Taking? Authorizing Provider  amLODipine (NORVASC) 10 MG tablet TAKE 1 TABLET DAILY Patient taking differently: Take 10 mg by mouth daily.  06/04/19  Yes [name], MD  atorvastatin  (LIPITOR) 10 MG tablet TAKE 1 TABLET DAILY AT 6 P.M. Patient taking differently: Take 10 mg by mouth daily.  11/30/18  Yes [name], MD  furosemide (LASIX) 40 MG tablet Take  20 mg  ( 1/2 tablet) twice a day except on Monday and Thursday take 40 mg twice a day. Patient taking differently: Take 20-40 mg by mouth See admin instructions. Take  20 mg  (  1/2 tablet) twice a day except on Monday and Thursday take 40 mg twice a day. 07/02/18  Yes [name], MD  HYDROcodone-acetaminophen (NORCO/VICODIN) 5-325 MG tablet Take 1 tablet by mouth 2 (two) times daily.  09/04/17  Yes [provider]  metoprolol tartrate (LOPRESSOR) 25 MG tablet TAKE ONE TABLET (25 MG TOTAL) EVERY MORNING AND ONE AND ONE-HALF TABLETS (37.5 MG TOTAL) EVERY EVENING Patient taking differently: Take 25-37.5 mg by mouth See admin instructions. Take 25mg  in the morning and 37.5mg  in the evening. 04/19/19  Yes [name], MD  XARELTO 20 MG TABS tablet TAKE 1 TABLET DAILY WITH SUPPER Patient taking differently: Take 20 mg by mouth daily with supper.  11/09/18  Yes [name], MD    Family History Family History  Problem Relation Age of Onset   Cancer Mother    Heart attack Father 9   Hypertension Brother    Cancer Sister    Other Neg Hx        He really does not know much about his parents and siblings health, but they did not speak about healthcare    Social History Social History   Tobacco Use   Smoking status: Former Smoker    Packs/day: 1.00    Years: 35.00    Pack years: 35.00    Types: Cigarettes    Quit date: 1996    Years since quitting: 24.7   Smokeless tobacco: Never Used   Tobacco comment: smoked from age [age]   Substance Use Topics   Alcohol use: No    Frequency: Never    Comment: Not any more   Drug use: No     Allergies   Aldactone [spironolactone]   Review of Systems Review of Systems  Constitutional: Positive for fatigue and fever.  Respiratory:  Positive for shortness of breath.   Gastrointestinal: Positive for abdominal pain and nausea.  All other systems reviewed and are negative.    Physical Exam Updated Vital Signs BP 102/86    Pulse 87    Temp 100.3 F (37.9 C) (Oral)    Resp (!) 22    Ht 6\' 1"  (1.854 m)    SpO2 94%    BMI 48.82 kg/m   Physical Exam Vitals signs and nursing note reviewed.  Constitutional:      Appearance: He is well-developed. He is obese.  HENT:     Head: Normocephalic and atraumatic.     Mouth/Throat:     Mouth: Mucous membranes are moist.     Pharynx: Oropharynx is clear.  Eyes:     Extraocular Movements: Extraocular movements intact.     Pupils: Pupils are equal, round, and reactive to light.  Cardiovascular:     Rate and Rhythm: Tachycardia present. Rhythm irregular.     Comments: Chronic lymphedema and skin changes to lower legs Pulmonary:     Effort: Pulmonary effort is normal. Tachypnea present.  Abdominal:     Palpations: Abdomen is soft.     Tenderness: There is abdominal tenderness in the suprapubic area.  Musculoskeletal:     Right lower leg: 3+ Edema present.     Left lower leg: 3+ Edema present.  Skin:    General: Skin is warm.     Capillary Refill: Capillary refill takes less than 2 seconds.  Neurological:     General: No focal deficit present.     Mental Status: He is alert and oriented to person, place, and time.  Psychiatric:  Mood and Affect: Mood normal.        Behavior: Behavior normal.      ED Treatments / Results  Labs (all labs ordered are listed, but only abnormal results are displayed) Labs Reviewed  CBC WITH DIFFERENTIAL/PLATELET - Abnormal; Notable for the following components:      Result Value   RBC 4.17 (*)    Platelets 149 (*)    Neutro Abs 8.5 (*)    Lymphs Abs 0.2 (*)    Abs Immature Granulocytes 0.08 (*)    All other components within normal limits  COMPREHENSIVE METABOLIC PANEL - Abnormal; Notable for the following components:    Potassium 3.1 (*)    CO2 17 (*)    Glucose, Bld 122 (*)    BUN 28 (*)    Creatinine, Ser 2.26 (*)    Calcium 8.6 (*)    Albumin 3.4 (*)    Total Bilirubin 2.0 (*)    GFR calc non Af Amer 27 (*)    GFR calc Af Amer 31 (*)    All other components within normal limits  URINALYSIS, ROUTINE W REFLEX MICROSCOPIC - Abnormal; Notable for the following components:   APPearance HAZY (*)    Hgb urine dipstick LARGE (*)    Protein, ur 30 (*)    Nitrite POSITIVE (*)    Leukocytes,Ua SMALL (*)    Bacteria, UA MANY (*)    All other components within normal limits  LACTIC ACID, PLASMA - Abnormal; Notable for the following components:   Lactic Acid, Venous 5.6 (*)    All other components within normal limits  LACTIC ACID, PLASMA - Abnormal; Notable for the following components:   Lactic Acid, Venous 3.0 (*)    All other components within normal limits  TROPONIN I (HIGH SENSITIVITY) - Abnormal; Notable for the following components:   Troponin I (High Sensitivity) 371 (*)    All other components within normal limits  TROPONIN I (HIGH SENSITIVITY) - Abnormal; Notable for the following components:   Troponin I (High Sensitivity) 496 (*)    All other components within normal limits  SARS CORONAVIRUS 2 (HOSPITAL ORDER, [place] [facility])  URINE CULTURE  CULTURE, BLOOD (ROUTINE X 2)  CULTURE, BLOOD (ROUTINE X 2)  LIPASE, BLOOD    EKG EKG Interpretation  Date/Time:  Monday June 24 2019 11:59:44 EDT Ventricular Rate:  129 PR Interval:    QRS Duration: 87 QT Interval:  296 QTC Calculation: 434 R Axis:   24 Text Interpretation:  Atrial fibrillation Low voltage, precordial leads Borderline repolarization abnormality No old tracing to compare Confirmed by Isla Pence [phone]) on 06/24/2019 12:18:18 PM   Radiology Ct Abdomen Pelvis Wo Contrast  Result Date: 06/24/2019 CLINICAL DATA:  78 year old male with recent history of abdominal pain. EXAM: CT CHEST, ABDOMEN AND  PELVIS WITHOUT CONTRAST TECHNIQUE: Multidetector CT imaging of the chest, abdomen and pelvis was performed following the standard protocol without IV contrast. COMPARISON:  No priors. FINDINGS: CT CHEST FINDINGS Cardiovascular: Heart size is mildly enlarged. There is no significant pericardial fluid, thickening or pericardial calcification. There is aortic atherosclerosis, as well as atherosclerosis of the great vessels of the mediastinum and the coronary arteries, including calcified atherosclerotic plaque in the left main, left anterior descending, left circumflex and right coronary arteries. Mediastinum/Nodes: No pathologically enlarged mediastinal or hilar lymph nodes. Please note that accurate exclusion of hilar adenopathy is limited on noncontrast CT scans. Esophagus is unremarkable in appearance. No axillary lymphadenopathy. Lungs/Pleura:  Areas of scarring or subsegmental atelectasis in the lower lobes of the lungs bilaterally. A few scattered small pulmonary nodules are noted in the lungs, largest of which is in the right middle lobe (axial image 84 of series 4) measuring 5 mm. No other larger more suspicious appearing pulmonary nodules or masses are noted. No acute consolidative airspace disease. No pleural effusions. Musculoskeletal: Large fatty attenuation lesion in the left chest wall extending into the left axillary region, incompletely imaged, but measuring at least 22.5 x 14.8 cm. There are no aggressive appearing lytic or blastic lesions noted in the visualized portions of the skeleton. CT ABDOMEN PELVIS FINDINGS Hepatobiliary: Diffuse low attenuation throughout the hepatic parenchyma, indicative of hepatic steatosis. No definite suspicious cystic or solid hepatic lesions are confidently identified on today's noncontrast CT examination. Intermediate attenuation lying dependently in the gallbladder, likely to represent tiny noncalcified gallstones. No findings to suggest an acute cholecystitis at this  time. Pancreas: No pancreatic mass. No pancreatic ductal dilatation. No pancreatic or peripancreatic fluid collections or inflammatory changes. Spleen: Unremarkable. Adrenals/Urinary Tract: Nonobstructive calculi in the lower pole collecting system of left kidney measuring up to 4 mm. In addition, there is a 1 cm calculus at the left ureteropelvic junction associated with mild-to-moderate proximal hydronephrosis and extensive perinephric stranding indicating obstruction at this time. No additional calculi are noted in the collecting system of the right kidney, along the course of the right ureter or within the lumen of the urinary bladder. No right hydroureteronephrosis. Unenhanced appearance of the kidneys is otherwise unremarkable. Urinary bladder is nearly decompressed, but otherwise unremarkable in appearance. Bilateral adrenal glands are normal in appearance. Stomach/Bowel: Unenhanced appearance of the stomach is normal. No pathologic dilatation of small bowel or colon. The appendix is not confidently identified and may be surgically absent. Regardless, there are no inflammatory changes noted adjacent to the cecum to suggest the presence of an acute appendicitis at this time. 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 No lymphadenopathy noted in the abdomen or pelvis. Reproductive: Prostate gland and seminal vesicles are unremarkable in appearance. Other: No significant volume of ascites.  No pneumoperitoneum. Musculoskeletal: There are no aggressive appearing lytic or blastic lesions noted in the visualized portions of the skeleton. IMPRESSION: 1. 1 cm calculus at the left ureteropelvic junction with mild to moderate proximal left hydronephrosis and extensive perinephric stranding indicative of  obstruction. 2. No acute findings noted in the thorax. 3. 2 additional nonobstructive calculi in the lower pole collecting system of left kidney measuring up to 4 mm. 4. Aortic atherosclerosis, in addition to left main and 3 vessel coronary artery disease. Assessment for potential risk factor modification, dietary therapy or pharmacologic therapy may be warranted, if clinically indicated. 5. Probable cholelithiasis without evidence of acute cholecystitis at this time. 6. Mild cardiomegaly. 7. 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 Electronically Signed   By: Vinnie Langton M.D.   On: 06/24/2019 15:16   Ct Chest Wo Contrast  Result Date: 06/24/2019 CLINICAL DATA:  78 year old male with recent history of abdominal pain. EXAM: CT CHEST, ABDOMEN AND PELVIS WITHOUT CONTRAST TECHNIQUE: Multidetector CT imaging of the chest, abdomen and pelvis was performed following the standard protocol without IV contrast. COMPARISON:  No priors. FINDINGS: CT CHEST FINDINGS Cardiovascular: Heart size is  mildly enlarged. There is no significant pericardial fluid, thickening or pericardial calcification. There is aortic atherosclerosis, as well as atherosclerosis of the great vessels of the mediastinum and the coronary arteries, including calcified atherosclerotic plaque in the left main, left anterior descending, left circumflex and right coronary arteries. Mediastinum/Nodes: No pathologically enlarged mediastinal or hilar lymph nodes. Please note that accurate exclusion of hilar adenopathy is limited on noncontrast CT scans. Esophagus is unremarkable in appearance. No axillary lymphadenopathy. Lungs/Pleura: Areas of scarring or subsegmental atelectasis in the lower lobes of the lungs bilaterally. A few scattered small pulmonary nodules are noted in the lungs, largest of which is in the right middle lobe (axial image 84 of series 4) measuring 5 mm. No other larger more suspicious appearing  pulmonary nodules or masses are noted. No acute consolidative airspace disease. No pleural effusions. Musculoskeletal: Large fatty attenuation lesion in the left chest wall extending into the left axillary region, incompletely imaged, but measuring at least 22.5 x 14.8 cm. There are no aggressive appearing lytic or blastic lesions noted in the visualized portions of the skeleton. CT ABDOMEN PELVIS FINDINGS Hepatobiliary: Diffuse low attenuation throughout the hepatic parenchyma, indicative of hepatic steatosis. No definite suspicious cystic or solid hepatic lesions are confidently identified on today's noncontrast CT examination. Intermediate attenuation lying dependently in the gallbladder, likely to represent tiny noncalcified gallstones. No findings to suggest an acute cholecystitis at this time. Pancreas: No pancreatic mass. No pancreatic ductal dilatation. No pancreatic or peripancreatic fluid collections or inflammatory changes. Spleen: Unremarkable. Adrenals/Urinary Tract: Nonobstructive calculi in the lower pole collecting system of left kidney measuring up to 4 mm. In addition, there is a 1 cm calculus at the left ureteropelvic junction associated with mild-to-moderate proximal hydronephrosis and extensive perinephric stranding indicating obstruction at this time. No additional calculi are noted in the collecting system of the right kidney, along the course of the right ureter or within the lumen of the urinary bladder. No right hydroureteronephrosis. Unenhanced appearance of the kidneys is otherwise unremarkable. Urinary bladder is nearly decompressed, but otherwise unremarkable in appearance. Bilateral adrenal glands are normal in appearance. Stomach/Bowel: Unenhanced appearance of the stomach is normal. No pathologic dilatation of small bowel or colon. The appendix is not confidently identified and may be surgically absent. Regardless, there are no inflammatory changes noted adjacent to the cecum to  suggest the presence of an acute appendicitis at this time. 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 No lymphadenopathy noted in the abdomen or pelvis. Reproductive: Prostate gland and seminal vesicles are unremarkable in appearance. Other: No significant volume of ascites.  No pneumoperitoneum. Musculoskeletal: There are no aggressive appearing lytic or blastic lesions noted in the visualized portions of the skeleton. IMPRESSION: 1. 1 cm calculus at the left ureteropelvic junction with mild to moderate proximal left hydronephrosis and extensive perinephric stranding indicative of obstruction. 2. No acute findings noted in the thorax. 3. 2 additional nonobstructive calculi in the lower pole collecting system of left kidney measuring up to 4 mm. 4. Aortic atherosclerosis, in addition to left main and 3 vessel coronary artery disease. Assessment for potential risk factor modification, dietary therapy or pharmacologic therapy may be warranted, if clinically indicated. 5. Probable cholelithiasis without evidence of acute cholecystitis at this time. 6. Mild cardiomegaly. 7. Large fatty attenuation lesion in the left  chest wall, presumably a large lipoma. Electronically Signed   By: Vinnie Langton M.D.   On: 06/24/2019 15:16   Dg Chest Portable 1 View  Result Date: 06/24/2019 CLINICAL DATA:  Shortness of breath. EXAM: PORTABLE CHEST 1 VIEW COMPARISON:  None. FINDINGS: Lung volumes are low but the lungs are clear. Heart size is upper normal. No pneumothorax or pleural fluid. No acute or focal bony abnormality. IMPRESSION: No acute disease. Electronically Signed   By: Inge Rise M.D.   On: 06/24/2019 12:22    Procedures Procedures (including critical care time)  Medications Ordered in  ED Medications  cefTRIAXone (ROCEPHIN) 1 g in sodium chloride 0.9 % 100 mL IVPB (0 g Intravenous Stopped 06/24/19 1458)  morphine 4 MG/ML injection 4 mg (4 mg Intravenous Given 06/24/19 1201)  ondansetron (ZOFRAN) injection 4 mg (4 mg Intravenous Given 06/24/19 1201)  sodium chloride 0.9 % bolus 1,000 mL (0 mLs Intravenous Stopped 06/24/19 1517)  metoprolol tartrate (LOPRESSOR) injection 5 mg (5 mg Intravenous Given 06/24/19 1202)  acetaminophen (TYLENOL) tablet 1,000 mg (1,000 mg Oral Given 06/24/19 1205)  sodium chloride 0.9 % bolus 1,000 mL (0 mLs Intravenous Stopped 06/24/19 1417)  ibuprofen (ADVIL) tablet 800 mg (800 mg Oral Given 06/24/19 1317)  sodium chloride 0.9 % bolus 1,000 mL (1,000 mLs Intravenous New Bag/Given 06/24/19 1459)     Initial Impression / Assessment and Plan / ED Course  I have reviewed the triage vital signs and the nursing notes.  Pertinent labs & imaging results that were available during my care of the patient were reviewed by me and considered in my medical decision making (see chart for details).   HR is elevated.  He has not taken his meds today due to n/v.  His fever was treated and HR treated with lopressor 5 mg IV. SOB has resolved with hr improvement.    Fluids were initially held as he has a hx of CH, but CXR was clear, so he was then given 1L and then an additional L of fluid.  BP did drop after the lopressor, but he did respond to IVFs.   The last labs I can find are from April 2019.  Cr of 1.05.  This is from Dr. Allison Quarry note in October of 2019.  So , he has an AKI.   Pt is likely septic from a left UVJ renal stone.  The pt was given 1g of rocephin IV.  He was d/w Dr. Lovena Neighbours (urology) who said pt needs a stent to his ureter asap.  Pt is not stable for transfer due to the elevated troponins, so Dr. Lovena Neighbours will work on seeing him here.  Pt's troponin is also elevated.  Pt d/w cardiology who will see in consult.  Pt is d/w Dr. Lorin Mercy (triad) who will  admit.  covid negative.  CRITICAL CARE Performed by: Isla Pence   Total critical care time: 60 minutes  Critical care time was exclusive of separately billable procedures and treating other patients.  Critical care was necessary to treat or prevent imminent or life-threatening deterioration.  Critical care was time spent personally by me on the following activities: development of treatment plan with patient and/or surrogate as well as nursing, discussions with consultants, evaluation of patient's response to treatment, examination of patient, obtaining history from patient or surrogate, ordering and performing treatments and interventions, ordering and review of laboratory studies, ordering and review of radiographic studies, pulse oximetry and re-evaluation of patient's condition.  Patrick Bishop was evaluated  in Emergency Department on 06/24/2019 for the symptoms described in the history of present illness. He was evaluated in the context of the global COVID-19 pandemic, which necessitated consideration that the patient might be at risk for infection with the SARS-CoV-2 virus that causes COVID-19. Institutional protocols and algorithms that pertain to the evaluation of patients at risk for COVID-19 are in a state of rapid change based on information released by regulatory bodies including the CDC and federal and state organizations. These policies and algorithms were followed during the patient's care in the ED.  Final Clinical Impressions(s) / ED Diagnoses   Final diagnoses:  Atrial fibrillation with rapid ventricular response (HCC)  Hypokalemia  AKI (acute kidney injury) ([place])  Acute cystitis with hematuria  Urinary tract obstruction by kidney stone  Sepsis with acute renal failure without septic shock, due to unspecified organism, unspecified acute renal failure type [facility])  Elevated troponin    ED Discharge Orders    None       Isla Pence, MD 06/24/19 1605     Isla Pence, MD 06/24/19 1605

## 2019-06-24 NOTE — ED Triage Notes (Signed)
Pt arrives via EMS from [place] living with reports of abd pain. Pt home O2 was 88% RA and placed on 4L. Hx of Afib and CHF. Pt HR 100-170 for EMS.

## 2019-06-25 ENCOUNTER — Encounter (HOSPITAL_COMMUNITY): Payer: Self-pay | Admitting: Interventional Radiology

## 2019-06-25 ENCOUNTER — Inpatient Hospital Stay (HOSPITAL_COMMUNITY): Payer: Medicare Other

## 2019-06-25 DIAGNOSIS — I4891 Unspecified atrial fibrillation: Secondary | ICD-10-CM

## 2019-06-25 LAB — CBC
HCT: 32 % — ABNORMAL LOW (ref 39.0–52.0)
Hemoglobin: 10.9 g/dL — ABNORMAL LOW (ref 13.0–17.0)
MCH: 32 pg (ref 26.0–34.0)
MCHC: 34.1 g/dL (ref 30.0–36.0)
MCV: 93.8 fL (ref 80.0–100.0)
Platelets: 144 10*3/uL — ABNORMAL LOW (ref 150–400)
RBC: 3.41 MIL/uL — ABNORMAL LOW (ref 4.22–5.81)
RDW: 14.6 % (ref 11.5–15.5)
WBC: 12.1 10*3/uL — ABNORMAL HIGH (ref 4.0–10.5)
nRBC: 0 % (ref 0.0–0.2)

## 2019-06-25 LAB — BASIC METABOLIC PANEL
Anion gap: 13 (ref 5–15)
BUN: 35 mg/dL — ABNORMAL HIGH (ref 8–23)
CO2: 14 mmol/L — ABNORMAL LOW (ref 22–32)
Calcium: 7.4 mg/dL — ABNORMAL LOW (ref 8.9–10.3)
Chloride: 111 mmol/L (ref 98–111)
Creatinine, Ser: 1.77 mg/dL — ABNORMAL HIGH (ref 0.61–1.24)
GFR calc Af Amer: 41 mL/min — ABNORMAL LOW (ref >60)
GFR calc non Af Amer: 36 mL/min — ABNORMAL LOW (ref >60)
Glucose, Bld: 97 mg/dL (ref 70–99)
Potassium: 4.9 mmol/L (ref 3.5–5.1)
Sodium: 138 mmol/L (ref 135–145)

## 2019-06-25 LAB — ECHOCARDIOGRAM COMPLETE
Height: 73 in
Weight: 6176.41 oz

## 2019-06-25 LAB — BLOOD CULTURE ID PANEL (REFLEXED)

## 2019-06-25 LAB — GLUCOSE, CAPILLARY: Glucose-Capillary: 157 mg/dL — ABNORMAL HIGH (ref 70–99)

## 2019-06-25 LAB — LACTIC ACID, PLASMA: Lactic Acid, Venous: 1.9 mmol/L (ref 0.5–1.9)

## 2019-06-25 LAB — MAGNESIUM: Magnesium: 2 mg/dL (ref 1.7–2.4)

## 2019-06-25 LAB — MRSA PCR SCREENING: MRSA by PCR: NEGATIVE

## 2019-06-25 MED ORDER — CHLORHEXIDINE GLUCONATE CLOTH 2 % EX PADS
6.0000 | MEDICATED_PAD | Freq: Every day | CUTANEOUS | Status: DC
  Administered 2019-06-25 – 2019-07-02 (×6): 6 via TOPICAL

## 2019-06-25 MED ORDER — LEVALBUTEROL HCL 0.63 MG/3ML IN NEBU: 0.6300 mg | INHALATION_SOLUTION | Freq: Four times a day (QID) | RESPIRATORY_TRACT | Status: DC | PRN

## 2019-06-25 MED ORDER — PROCHLORPERAZINE EDISYLATE 10 MG/2ML IJ SOLN
5.0000 mg | Freq: Once | INTRAMUSCULAR | Status: AC
  Administered 2019-06-25: 23:00:00 5 mg via INTRAVENOUS
  Filled 2019-06-25: qty 1

## 2019-06-25 MED ORDER — FUROSEMIDE 10 MG/ML IJ SOLN
40.0000 mg | Freq: Once | INTRAMUSCULAR | Status: AC
  Administered 2019-06-25: 20:00:00 40 mg via INTRAVENOUS
  Filled 2019-06-25: qty 4

## 2019-06-25 MED ORDER — IPRATROPIUM-ALBUTEROL 0.5-2.5 (3) MG/3ML IN SOLN
RESPIRATORY_TRACT | Status: AC
  Filled 2019-06-25: qty 3

## 2019-06-25 MED ORDER — SODIUM BICARBONATE 650 MG PO TABS
650.0000 mg | ORAL_TABLET | Freq: Three times a day (TID) | ORAL | Status: DC
  Administered 2019-06-25 – 2019-06-28 (×8): 650 mg via ORAL
  Filled 2019-06-25 (×8): qty 1

## 2019-06-25 MED ORDER — IPRATROPIUM-ALBUTEROL 0.5-2.5 (3) MG/3ML IN SOLN
3.0000 mL | Freq: Four times a day (QID) | RESPIRATORY_TRACT | Status: DC
  Administered 2019-06-25 (×2): 3 mL via RESPIRATORY_TRACT
  Filled 2019-06-25: qty 3

## 2019-06-25 MED ORDER — LEVALBUTEROL HCL 0.63 MG/3ML IN NEBU
0.6300 mg | INHALATION_SOLUTION | Freq: Four times a day (QID) | RESPIRATORY_TRACT | Status: DC
  Administered 2019-06-26 – 2019-06-29 (×16): 0.63 mg via RESPIRATORY_TRACT
  Filled 2019-06-25 (×16): qty 3

## 2019-06-25 MED ORDER — METOPROLOL SUCCINATE ER 25 MG PO TB24
12.5000 mg | ORAL_TABLET | Freq: Every day | ORAL | Status: DC
  Administered 2019-06-25: 12.5 mg via ORAL
  Filled 2019-06-25: qty 1

## 2019-06-25 MED ORDER — IPRATROPIUM-ALBUTEROL 0.5-2.5 (3) MG/3ML IN SOLN: 3.0000 mL | Freq: Four times a day (QID) | RESPIRATORY_TRACT | Status: DC

## 2019-06-25 MED ORDER — PERFLUTREN LIPID MICROSPHERE
1.0000 mL | INTRAVENOUS | Status: AC | PRN
  Administered 2019-06-25: 2 mL via INTRAVENOUS
  Filled 2019-06-25: qty 10

## 2019-06-25 MED ORDER — SODIUM CHLORIDE 0.9 % IV SOLN
2.0000 g | Freq: Every day | INTRAVENOUS | Status: DC
  Administered 2019-06-25 – 2019-06-27 (×3): 2 g via INTRAVENOUS
  Filled 2019-06-25: qty 20
  Filled 2019-06-25 (×2): qty 2

## 2019-06-25 NOTE — Progress Notes (Signed)
PHARMACY - PHYSICIAN COMMUNICATION CRITICAL VALUE ALERT - BLOOD CULTURE IDENTIFICATION (BCID)  Patrick Bishop is an 78 y.o. male who presented to [facility] on 06/24/2019 with a chief complaint of L ureteral stone/urosepsis  Assessment:  1/2 blood cultures growing Proteus species  Name of physician (or Provider) Contacted: Tylene Fantasia  Current antibiotics: Cefepime and Cipro  Changes to prescribed antibiotics recommended:  Will change Cefepime to Rocephin 2 g IV q24h. Consider D/C Cipro  Results for orders placed or performed during the hospital encounter of 06/24/19  Blood Culture ID Panel (Reflexed) (Collected: 06/24/2019 11:58 AM)  Result Value Ref Range   Enterococcus species NOT DETECTED NOT DETECTED   Listeria monocytogenes NOT DETECTED NOT DETECTED   Staphylococcus species NOT DETECTED NOT DETECTED   Staphylococcus aureus (BCID) NOT DETECTED NOT DETECTED   Streptococcus species NOT DETECTED NOT DETECTED   Streptococcus agalactiae NOT DETECTED NOT DETECTED   Streptococcus pneumoniae NOT DETECTED NOT DETECTED   Streptococcus pyogenes NOT DETECTED NOT DETECTED   Acinetobacter baumannii NOT DETECTED NOT DETECTED   Enterobacteriaceae species DETECTED (A) NOT DETECTED   Enterobacter cloacae complex NOT DETECTED NOT DETECTED   Escherichia coli NOT DETECTED NOT DETECTED   Klebsiella oxytoca NOT DETECTED NOT DETECTED   Klebsiella pneumoniae NOT DETECTED NOT DETECTED   Proteus species DETECTED (A) NOT DETECTED   Serratia marcescens NOT DETECTED NOT DETECTED   Carbapenem resistance NOT DETECTED NOT DETECTED   Haemophilus influenzae NOT DETECTED NOT DETECTED   Neisseria meningitidis NOT DETECTED NOT DETECTED   Pseudomonas aeruginosa NOT DETECTED NOT DETECTED   Candida albicans NOT DETECTED NOT DETECTED   Candida glabrata NOT DETECTED NOT DETECTED   Candida krusei NOT DETECTED NOT DETECTED   Candida parapsilosis NOT DETECTED NOT DETECTED   Candida tropicalis NOT DETECTED NOT DETECTED     [name] 06/25/2019  5:17 AM

## 2019-06-25 NOTE — Progress Notes (Signed)
Cardiology Progress Note  Patient ID: Patrick Bishop MRN: [id number] DOB: 24-Jul-1941 Date of Encounter: 06/25/2019  Primary Cardiologist: Glenetta Hew, MD  Subjective  Status post nephrostomy tube placement yesterday.  Hemodynamics much improved.  Telemetry reveals atrial fibrillation with heart rate in the 90-100 range.  He reports no symptoms of chest pain or trouble breathing.  ROS:  All other ROS reviewed and negative. Pertinent positives noted in the HPI.     Inpatient Medications  Scheduled Meds:  atorvastatin  10 mg Oral Daily   Chlorhexidine Gluconate Cloth  6 each Topical Daily   sodium chloride flush  3 mL Intravenous Q12H   Continuous Infusions:  cefTRIAXone (ROCEPHIN)  IV 2 g (06/25/19 0830)   PRN Meds: acetaminophen **OR** acetaminophen, ondansetron **OR** ondansetron (ZOFRAN) IV, polyethylene glycol   Vital Signs   Vitals:   06/25/19 0500 06/25/19 0522 06/25/19 0600 06/25/19 0840  BP:  105/67  109/64  Pulse:    (!) 129  Resp:    (!) 28  Temp:  98.3 F (36.8 C)    TempSrc:  Oral    SpO2:  91% 93%   Weight: (!) 175.1 kg     Height:        Intake/Output Summary (Last 24 hours) at 06/25/2019 1039 Last data filed at 06/25/2019 0845 Gross per 24 hour  Intake 2728.34 ml  Output 825 ml  Net 1903.34 ml   Last 3 Weights 06/25/2019 06/24/2019 01/03/2019  Weight (lbs) 386 lb 0.4 oz 365 lb (No Data)  Weight (kg) 175.1 kg 165.563 kg (No Data)      Telemetry  Overnight telemetry shows atrial fibrillation with heart rate in the 90-100 range, which I personally reviewed.   Physical Exam   Vitals:   06/25/19 0500 06/25/19 0522 06/25/19 0600 06/25/19 0840  BP:  105/67  109/64  Pulse:    (!) 129  Resp:    (!) 28  Temp:  98.3 F (36.8 C)    TempSrc:  Oral    SpO2:  91% 93%   Weight: (!) 175.1 kg     Height:         Intake/Output Summary (Last 24 hours) at 06/25/2019 1039 Last data filed at 06/25/2019 0845 Gross per 24 hour  Intake 2728.34 ml  Output  825 ml  Net 1903.34 ml    Last 3 Weights 06/25/2019 06/24/2019 01/03/2019  Weight (lbs) 386 lb 0.4 oz 365 lb (No Data)  Weight (kg) 175.1 kg 165.563 kg (No Data)    Body mass index is 50.93 kg/m.  General: Morbidly obese male no acute distress Head: Atraumatic, normal size  Eyes: PEERLA, EOMI  Neck: Neck adiposity precludes JVD assessment Endocrine: No thryomegaly Cardiac: Irregular rhythm no murmurs rubs or gallops Lungs: Diminished breath sounds bilaterally Abd: Soft, nontender, no hepatomegaly  Ext: Chronic venous insufficiency changes noted, 1+ lower extremity edema Musculoskeletal: No deformities, BUE and BLE strength normal and equal Skin: Venous insufficiency changes noted in the lower extremities Neuro: Alert and oriented to person, place, time, and situation, CNII-XII grossly intact, no focal deficits  Psych: Normal mood and affect   Labs  High Sensitivity Troponin:   Recent Labs  Lab 06/24/19 1158 06/24/19 1400  TROPONINIHS 371* 496*     Cardiac EnzymesNo results for input(s): TROPONINI in the last 168 hours. No results for input(s): TROPIPOC in the last 168 hours.  Chemistry Recent Labs  Lab 06/24/19 1158 06/25/19 0357  NA 141 138  K 3.1* 4.9  CL 110  111  CO2 17* 14*  GLUCOSE 122* 97  BUN 28* 35*  CREATININE 2.26* 1.77*  CALCIUM 8.6* 7.4*  PROT 6.7  --   ALBUMIN 3.4*  --   AST 41  --   ALT 19  --   ALKPHOS 59  --   BILITOT 2.0*  --   GFRNONAA 27* 36*  GFRAA 31* 41*  ANIONGAP 14 13    Hematology Recent Labs  Lab 06/24/19 1158 06/25/19 0816  WBC 9.4 12.1*  RBC 4.17* 3.41*  HGB 13.0 10.9*  HCT 39.6 32.0*  MCV 95.0 93.8  MCH 31.2 32.0  MCHC 32.8 34.1  RDW 13.9 14.6  PLT 149* 144*   BNPNo results for input(s): BNP, PROBNP in the last 168 hours.  DDimer No results for input(s): DDIMER in the last 168 hours.   Radiology  Ct Abdomen Pelvis Wo Contrast  Result Date: 06/24/2019 CLINICAL DATA:  78 year old male with recent history of abdominal  pain. EXAM: CT CHEST, ABDOMEN AND PELVIS WITHOUT CONTRAST TECHNIQUE: Multidetector CT imaging of the chest, abdomen and pelvis was performed following the standard protocol without IV contrast. COMPARISON:  No priors. FINDINGS: CT CHEST FINDINGS Cardiovascular: Heart size is mildly enlarged. There is no significant pericardial fluid, thickening or pericardial calcification. There is aortic atherosclerosis, as well as atherosclerosis of the great vessels of the mediastinum and the coronary arteries, including calcified atherosclerotic plaque in the left main, left anterior descending, left circumflex and right coronary arteries. Mediastinum/Nodes: No pathologically enlarged mediastinal or hilar lymph nodes. Please note that accurate exclusion of hilar adenopathy is limited on noncontrast CT scans. Esophagus is unremarkable in appearance. No axillary lymphadenopathy. Lungs/Pleura: Areas of scarring or subsegmental atelectasis in the lower lobes of the lungs bilaterally. A few scattered small pulmonary nodules are noted in the lungs, largest of which is in the right middle lobe (axial image 84 of series 4) measuring 5 mm. No other larger more suspicious appearing pulmonary nodules or masses are noted. No acute consolidative airspace disease. No pleural effusions. Musculoskeletal: Large fatty attenuation lesion in the left chest wall extending into the left axillary region, incompletely imaged, but measuring at least 22.5 x 14.8 cm. There are no aggressive appearing lytic or blastic lesions noted in the visualized portions of the skeleton. CT ABDOMEN PELVIS FINDINGS Hepatobiliary: Diffuse low attenuation throughout the hepatic parenchyma, indicative of hepatic steatosis. No definite suspicious cystic or solid hepatic lesions are confidently identified on today's noncontrast CT examination. Intermediate attenuation lying dependently in the gallbladder, likely to represent tiny noncalcified gallstones. No findings to  suggest an acute cholecystitis at this time. Pancreas: No pancreatic mass. No pancreatic ductal dilatation. No pancreatic or peripancreatic fluid collections or inflammatory changes. Spleen: Unremarkable. Adrenals/Urinary Tract: Nonobstructive calculi in the lower pole collecting system of left kidney measuring up to 4 mm. In addition, there is a 1 cm calculus at the left ureteropelvic junction associated with mild-to-moderate proximal hydronephrosis and extensive perinephric stranding indicating obstruction at this time. No additional calculi are noted in the collecting system of the right kidney, along the course of the right ureter or within the lumen of the urinary bladder. No right hydroureteronephrosis. Unenhanced appearance of the kidneys is otherwise unremarkable. Urinary bladder is nearly decompressed, but otherwise unremarkable in appearance. Bilateral adrenal glands are normal in appearance. Stomach/Bowel: Unenhanced appearance of the stomach is normal. No pathologic dilatation of small bowel or colon. The appendix is not confidently identified and may be surgically absent. Regardless, there are no inflammatory  changes noted adjacent to the cecum to suggest the presence of an acute appendicitis at this time. 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 No lymphadenopathy noted in the abdomen or pelvis. Reproductive: Prostate gland and seminal vesicles are unremarkable in appearance. Other: No significant volume of ascites.  No pneumoperitoneum. Musculoskeletal: There are no aggressive appearing lytic or blastic lesions noted in the visualized portions of the skeleton. IMPRESSION: 1. 1 cm calculus at the left ureteropelvic junction with mild to moderate proximal left hydronephrosis and extensive  perinephric stranding indicative of obstruction. 2. No acute findings noted in the thorax. 3. 2 additional nonobstructive calculi in the lower pole collecting system of left kidney measuring up to 4 mm. 4. Aortic atherosclerosis, in addition to left main and 3 vessel coronary artery disease. Assessment for potential risk factor modification, dietary therapy or pharmacologic therapy may be warranted, if clinically indicated. 5. Probable cholelithiasis without evidence of acute cholecystitis at this time. 6. Mild cardiomegaly. 7. 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 Electronically Signed   By: Vinnie Langton M.D.   On: 06/24/2019 15:16   Ct Chest Wo Contrast  Result Date: 06/24/2019 CLINICAL DATA:  78 year old male with recent history of abdominal pain. EXAM: CT CHEST, ABDOMEN AND PELVIS WITHOUT CONTRAST TECHNIQUE: Multidetector CT imaging of the chest, abdomen and pelvis was performed following the standard protocol without IV contrast. COMPARISON:  No priors. FINDINGS: CT CHEST FINDINGS Cardiovascular: Heart size is mildly enlarged. There is no significant pericardial fluid, thickening or pericardial calcification. There is aortic atherosclerosis, as well as atherosclerosis of the great vessels of the mediastinum and the coronary arteries, including calcified atherosclerotic plaque in the left main, left anterior descending, left circumflex and right coronary arteries. Mediastinum/Nodes: No pathologically enlarged mediastinal or hilar lymph nodes. Please note that accurate exclusion of hilar adenopathy is limited on noncontrast CT scans. Esophagus is unremarkable in appearance. No axillary lymphadenopathy. Lungs/Pleura: Areas of scarring or subsegmental atelectasis in the lower lobes of the lungs bilaterally. A few scattered small pulmonary nodules are noted in the lungs, largest of which is in the right middle lobe (axial image 84 of series 4) measuring 5 mm. No  other larger more suspicious appearing pulmonary nodules or masses are noted. No acute consolidative airspace disease. No pleural effusions. Musculoskeletal: Large fatty attenuation lesion in the left chest wall extending into the left axillary region, incompletely imaged, but measuring at least 22.5 x 14.8 cm. There are no aggressive appearing lytic or blastic lesions noted in the visualized portions of the skeleton. CT ABDOMEN PELVIS FINDINGS Hepatobiliary: Diffuse low attenuation throughout the hepatic parenchyma, indicative of hepatic steatosis. No definite suspicious cystic or solid hepatic lesions are confidently identified on today's noncontrast CT examination. Intermediate attenuation lying dependently in the gallbladder, likely to represent tiny noncalcified gallstones. No findings to suggest an acute cholecystitis at this time. Pancreas: No pancreatic mass. No pancreatic ductal dilatation. No pancreatic or peripancreatic fluid collections or inflammatory changes. Spleen: Unremarkable. Adrenals/Urinary Tract: Nonobstructive calculi in the lower pole collecting system of left kidney measuring up to 4 mm. In addition, there is a 1 cm calculus at the left ureteropelvic junction associated with mild-to-moderate proximal hydronephrosis and extensive perinephric stranding indicating obstruction at this time. No additional calculi are noted in the collecting  system of the right kidney, along the course of the right ureter or within the lumen of the urinary bladder. No right hydroureteronephrosis. Unenhanced appearance of the kidneys is otherwise unremarkable. Urinary bladder is nearly decompressed, but otherwise unremarkable in appearance. Bilateral adrenal glands are normal in appearance. Stomach/Bowel: Unenhanced appearance of the stomach is normal. No pathologic dilatation of small bowel or colon. The appendix is not confidently identified and may be surgically absent. Regardless, there are no inflammatory  changes noted adjacent to the cecum to suggest the presence of an acute appendicitis at this time. 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 No lymphadenopathy noted in the abdomen or pelvis. Reproductive: Prostate gland and seminal vesicles are unremarkable in appearance. Other: No significant volume of ascites.  No pneumoperitoneum. Musculoskeletal: There are no aggressive appearing lytic or blastic lesions noted in the visualized portions of the skeleton. IMPRESSION: 1. 1 cm calculus at the left ureteropelvic junction with mild to moderate proximal left hydronephrosis and extensive perinephric stranding indicative of obstruction. 2. No acute findings noted in the thorax. 3. 2 additional nonobstructive calculi in the lower pole collecting system of left kidney measuring up to 4 mm. 4. Aortic atherosclerosis, in addition to left main and 3 vessel coronary artery disease. Assessment for potential risk factor modification, dietary therapy or pharmacologic therapy may be warranted, if clinically indicated. 5. Probable cholelithiasis without evidence of acute cholecystitis at this time. 6. Mild cardiomegaly. 7. 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 Electronically Signed   By: Vinnie Langton M.D.   On: 06/24/2019 15:16   Ir US Guidance  Result Date: 06/25/2019 INDICATION: 78 year old male with a history of urosepsis structure ureteral stone EXAM: IMAGE GUIDED PERCUTANEOUS NEPHROSTOMY COMPARISON:  None. MEDICATIONS: Ciprofloxacin 400 mg IV; The antibiotic was administered in an appropriate time frame prior to skin puncture. ANESTHESIA/SEDATION: None The patient was continuously monitored during the procedure by the interventional radiology nurse under my  direct supervision. CONTRAST:  15 cc-administered into the collecting system(s) FLUOROSCOPY TIME:  Fluoroscopy Time: 0 minutes 42 seconds (16.9 mGy). COMPLICATIONS: None PROCEDURE: Informed written consent was obtained from the patient after a thorough discussion of the procedural risks, benefits and alternatives. All questions were addressed. Maximal Sterile Barrier Technique was utilized including caps, mask, sterile gowns, sterile gloves, sterile drape, hand hygiene and skin antiseptic. A timeout was performed prior to the initiation of the procedure. Patient positioned prone position on the fluoroscopy table. Ultrasound survey of the left flank was performed with images stored and sent to PACs. The patient was then prepped and draped in the usual sterile fashion. 1% lidocaine was used to anesthetize the skin and subcutaneous tissues for local anesthesia. A Chiba needle was then used to access a posterior inferior calyx with ultrasound guidance. With spontaneous urine returned through the needle, passage of an 018 micro wire into the collecting system was performed under fluoroscopy. A small incision was made with an 11 blade scalpel, and the needle was removed from the wire. An Accustick system was then advanced over the wire into the collecting system under fluoroscopy. The metal stiffener and inner dilator were removed, and then a sample of fluid was aspirated through the 4 French outer sheath. Bentson wire was passed into the collecting system and the sheath removed.  Ten French dilation of the soft tissues was performed. Using modified Seldinger technique, a 10 French pigtail catheter drain was placed over the Bentson wire. Wire and inner stiffener removed, and the pigtail was formed in the collecting system. Sample was sent for culture. Small amount of contrast confirmed position of the catheter. Patient tolerated the procedure well and remained hemodynamically stable throughout. No complications were  encountered and no significant blood loss encountered IMPRESSION: Status post image guided left-sided percutaneous nephrostomy. Signed, Dulcy Fanny. Dellia Nims, RPVI [facility] Radiology Electronically Signed   By: Corrie Mckusick D.O.   On: 06/25/2019 08:15   Dg Chest Port 1 View  Result Date: 06/25/2019 CLINICAL DATA:  Hypoxia EXAM: PORTABLE CHEST 1 VIEW COMPARISON:  06/24/2019 FINDINGS: Cardiac shadow remains enlarged but accentuated by the portable technique. The lungs are well aerated bilaterally. No focal infiltrate or sizable effusion is noted. Minimal basilar atelectatic changes are seen. IMPRESSION: Mild basilar atelectasis similar to that seen on prior CT examination. Electronically Signed   By: Inez Catalina M.D.   On: 06/25/2019 09:42   Dg Chest Portable 1 View  Result Date: 06/24/2019 CLINICAL DATA:  Shortness of breath. EXAM: PORTABLE CHEST 1 VIEW COMPARISON:  None. FINDINGS: Lung volumes are low but the lungs are clear. Heart size is upper normal. No pneumothorax or pleural fluid. No acute or focal bony abnormality. IMPRESSION: No acute disease. Electronically Signed   By: Inge Rise M.D.   On: 06/24/2019 12:22   Ir Nephrostomy Placement Left  Result Date: 06/25/2019 INDICATION: 78 year old male with a history of urosepsis structure ureteral stone EXAM: IMAGE GUIDED PERCUTANEOUS NEPHROSTOMY COMPARISON:  None. MEDICATIONS: Ciprofloxacin 400 mg IV; The antibiotic was administered in an appropriate time frame prior to skin puncture. ANESTHESIA/SEDATION: None The patient was continuously monitored during the procedure by the interventional radiology nurse under my direct supervision. CONTRAST:  15 cc-administered into the collecting system(s) FLUOROSCOPY TIME:  Fluoroscopy Time: 0 minutes 42 seconds (16.9 mGy). COMPLICATIONS: None PROCEDURE: Informed written consent was obtained from the patient after a thorough discussion of the procedural  risks, benefits and alternatives. All questions were addressed. Maximal Sterile Barrier Technique was utilized including caps, mask, sterile gowns, sterile gloves, sterile drape, hand hygiene and skin antiseptic. A timeout was performed prior to the initiation of the procedure. Patient positioned prone position on the fluoroscopy table. Ultrasound survey of the left flank was performed with images stored and sent to PACs. The patient was then prepped and draped in the usual sterile fashion. 1% lidocaine was used to anesthetize the skin and subcutaneous tissues for local anesthesia. A Chiba needle was then used to access a posterior inferior calyx with ultrasound guidance. With spontaneous urine returned through the needle, passage of an 018 micro wire into the collecting system was performed under fluoroscopy. A small incision was made with an 11 blade scalpel, and the needle was removed from the wire. An Accustick system was then advanced over the wire into the collecting system under fluoroscopy. The metal stiffener and inner dilator were removed, and then a sample of fluid was aspirated through the 4 French outer sheath. Bentson wire was passed into the collecting system and the sheath removed. Ten French dilation of the soft tissues was performed. Using modified Seldinger technique, a 10 French pigtail catheter drain was placed over the Bentson wire. Wire and inner stiffener removed, and the pigtail was formed in the collecting system. Sample was sent for culture. Small amount of  contrast confirmed position of the catheter. Patient tolerated the procedure well and remained hemodynamically stable throughout. No complications were encountered and no significant blood loss encountered IMPRESSION: Status post image guided left-sided percutaneous nephrostomy. Signed, Dulcy Fanny. Dellia Nims, RPVI [facility] Radiology Electronically Signed   By: Corrie Mckusick D.O.   On:  06/25/2019 08:15   Patient Profile  Andry Bogden is a 78 y.o. male with history of morbid obesity, chronic venous insufficiency status post lower extremity venous stenting, nonobstructive CAD, atrial fibrillation anticoagulation who was admitted for septic shock on 9/28.  Course complicated by his fibrillation RVR.  Assessment & Plan  1.  Non-myocardial infarction troponin elevation: He has no symptoms to suggest this is ACS.  Likely this is all demand in the setting of septic shock.  We will follow the results of his echocardiogram. 2.  Atrial fibrillation on anticoagulation: I think it is reasonable to resume his home metoprolol 25 mg succinate daily and I will do this today.  Please reach out to urology on when it is safe to resume his home anticoagulation.  There is no rush to do this. 3.  Volume overload: He has significant volume on him due to the fluid he received in the emergency room.  He will need diuresis in the next day or 2.  For questions or updates, please contact [place] Please consult [email or web address] for contact info under        Signed, [place] Bells T. Audie Box, [place]  06/25/2019 10:39 AM

## 2019-06-25 NOTE — Progress Notes (Addendum)
Patient vomits 20cc and clear brownish emesis. Continues to feel nauseated. Has zofran ordered q 6 hours. Paged [facility] for orders. Awaiting call back New orders noted.  63- Paged [facility] about patient vomiting 100cc of brown emesis. Appears to possibly be old blood. Awaiting call back   0324- Paged [facility] x 2. To notify of patient's HR during this shift. 110s-120s, also to notify of dark brown emesis from earlier. Patient nausea seems a little better, is resting with eyes closed.  New orders noted.   26- Paged [facility] about patient continuing to vomit. Zofran has been unsuccessful during this shift.  [facility] called back, ordered to inform if patient vomits again. Changes in med noted.   79- Paged [facility] about patient vomiting 175cc of dark brown emesis.   0510- NG tube attempted to right nostril- fail. Assisted by NT and Charge RN, Janett Billow.  Attempted to left nostril. Was able to advance. Some blood noted in tube. Gas sound not audible. Order for KUB ordered.   0555- [facility] was paged several times during the night as to patient's condition. Interventions ordered and carried out. HR has stayed elevated during the night at 110s-120s. Patient's only complaint during the night has been nausea and several episodes of vomiting dark, brown emesis. Concerns voiced to MD, b/ps WNL. Labs drawn this am. Hgb, hct normal. Abd x ray done, no obstruction. NG tube ordered. Attemptedx 2. Awaiting KUB. Compazine given as needed. Mews score red d/t patient's respirations, and heart rate increases when he is dry heaving and vomiting.

## 2019-06-25 NOTE — Progress Notes (Addendum)
1 Day Post-Op Subjective: Status post left PCN with interventional radiology.  The patient is resting comfortably in bed and reports improvement in his left-sided flank pain.  No complaints at this time.  Objective: Vital signs in last 24 hours: Temp:  [97.8 F (36.6 C)-103.7 F (39.8 C)] 98.8 F (37.1 C) (09/29 0030) Pulse Rate:  [72-120] 87 (09/29 0245) Resp:  [19-47] 23 (09/29 0245) BP: (73-137)/(43-98) 103/56 (09/29 0245) SpO2:  [93 %-98 %] 96 % (09/29 0245) Weight:  [165.6 kg] 165.6 kg (09/28 1800)  Intake/Output from previous day: 09/28 0701 - 09/29 0700 In: 2488.3 [IV Piggyback:2488.3] Out: 25 [Urine:25]  Intake/Output this shift: Total I/O In: 488.3 [IV Piggyback:488.3] Out: 25 [Urine:25]  Physical Exam:  General: Alert and oriented CV: RRR, palpable distal pulses Lungs: CTAB, equal chest rise Abdomen: Obese.  Soft, NTND, no rebound or guarding Gu: Left PCN in place and draining amber urine.  Foley catheter in place and draining amber urine Ext: NT, No erythema  Lab Results: Recent Labs    06/24/19 1158  HGB 13.0  HCT 39.6   BMET Recent Labs    06/24/19 1158  NA 141  K 3.1*  CL 110  CO2 17*  GLUCOSE 122*  BUN 28*  CREATININE 2.26*  CALCIUM 8.6*     Studies/Results: Ct Abdomen Pelvis Wo Contrast  Result Date: 06/24/2019 CLINICAL DATA:  78 year old male with recent history of abdominal pain. EXAM: CT CHEST, ABDOMEN AND PELVIS WITHOUT CONTRAST TECHNIQUE: Multidetector CT imaging of the chest, abdomen and pelvis was performed following the standard protocol without IV contrast. COMPARISON:  No priors. FINDINGS: CT CHEST FINDINGS Cardiovascular: Heart size is mildly enlarged. There is no significant pericardial fluid, thickening or pericardial calcification. There is aortic atherosclerosis, as well as atherosclerosis of the great vessels of the mediastinum and the coronary arteries, including calcified atherosclerotic plaque in the left main, left  anterior descending, left circumflex and right coronary arteries. Mediastinum/Nodes: No pathologically enlarged mediastinal or hilar lymph nodes. Please note that accurate exclusion of hilar adenopathy is limited on noncontrast CT scans. Esophagus is unremarkable in appearance. No axillary lymphadenopathy. Lungs/Pleura: Areas of scarring or subsegmental atelectasis in the lower lobes of the lungs bilaterally. A few scattered small pulmonary nodules are noted in the lungs, largest of which is in the right middle lobe (axial image 84 of series 4) measuring 5 mm. No other larger more suspicious appearing pulmonary nodules or masses are noted. No acute consolidative airspace disease. No pleural effusions. Musculoskeletal: Large fatty attenuation lesion in the left chest wall extending into the left axillary region, incompletely imaged, but measuring at least 22.5 x 14.8 cm. There are no aggressive appearing lytic or blastic lesions noted in the visualized portions of the skeleton. CT ABDOMEN PELVIS FINDINGS Hepatobiliary: Diffuse low attenuation throughout the hepatic parenchyma, indicative of hepatic steatosis. No definite suspicious cystic or solid hepatic lesions are confidently identified on today's noncontrast CT examination. Intermediate attenuation lying dependently in the gallbladder, likely to represent tiny noncalcified gallstones. No findings to suggest an acute cholecystitis at this time. Pancreas: No pancreatic mass. No pancreatic ductal dilatation. No pancreatic or peripancreatic fluid collections or inflammatory changes. Spleen: Unremarkable. Adrenals/Urinary Tract: Nonobstructive calculi in the lower pole collecting system of left kidney measuring up to 4 mm. In addition, there is a 1 cm calculus at the left ureteropelvic junction associated with mild-to-moderate proximal hydronephrosis and extensive perinephric stranding indicating obstruction at this time. No additional calculi are noted in the  collecting system  of the right kidney, along the course of the right ureter or within the lumen of the urinary bladder. No right hydroureteronephrosis. Unenhanced appearance of the kidneys is otherwise unremarkable. Urinary bladder is nearly decompressed, but otherwise unremarkable in appearance. Bilateral adrenal glands are normal in appearance. Stomach/Bowel: Unenhanced appearance of the stomach is normal. No pathologic dilatation of small bowel or colon. The appendix is not confidently identified and may be surgically absent. Regardless, there are no inflammatory changes noted adjacent to the cecum to suggest the presence of an acute appendicitis at this time. 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 No lymphadenopathy noted in the abdomen or pelvis. Reproductive: Prostate gland and seminal vesicles are unremarkable in appearance. Other: No significant volume of ascites.  No pneumoperitoneum. Musculoskeletal: There are no aggressive appearing lytic or blastic lesions noted in the visualized portions of the skeleton. IMPRESSION: 1. 1 cm calculus at the left ureteropelvic junction with mild to moderate proximal left hydronephrosis and extensive perinephric stranding indicative of obstruction. 2. No acute findings noted in the thorax. 3. 2 additional nonobstructive calculi in the lower pole collecting system of left kidney measuring up to 4 mm. 4. Aortic atherosclerosis, in addition to left main and 3 vessel coronary artery disease. Assessment for potential risk factor modification, dietary therapy or pharmacologic therapy may be warranted, if clinically indicated. 5. Probable cholelithiasis without evidence of acute cholecystitis at this time. 6. Mild cardiomegaly. 7. 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 Electronically Signed   By: Vinnie Langton M.D.   On: 06/24/2019 15:16   Ct Chest Wo Contrast  Result Date: 06/24/2019 CLINICAL DATA:  78 year old male with recent history of abdominal pain. EXAM: CT CHEST, ABDOMEN AND PELVIS WITHOUT CONTRAST TECHNIQUE: Multidetector CT imaging of the chest, abdomen and pelvis was performed following the standard protocol without IV contrast. COMPARISON:  No priors. FINDINGS: CT CHEST FINDINGS Cardiovascular: Heart size is mildly enlarged. There is no significant pericardial fluid, thickening or pericardial calcification. There is aortic atherosclerosis, as well as atherosclerosis of the great vessels of the mediastinum and the coronary arteries, including calcified atherosclerotic plaque in the left main, left anterior descending, left circumflex and right coronary arteries. Mediastinum/Nodes: No pathologically enlarged mediastinal or hilar lymph nodes. Please note that accurate exclusion of hilar adenopathy is limited on noncontrast CT scans. Esophagus is unremarkable in appearance. No axillary lymphadenopathy. Lungs/Pleura: Areas of scarring or subsegmental atelectasis in the lower lobes of the lungs bilaterally. A few scattered small pulmonary nodules are noted in the lungs, largest of which is in the right middle lobe (axial image 84 of series 4) measuring 5 mm. No other larger more suspicious appearing pulmonary nodules or masses are noted. No acute consolidative airspace disease. No pleural effusions. Musculoskeletal: Large fatty attenuation lesion in the left chest wall extending into the left axillary region, incompletely imaged, but measuring at least 22.5 x 14.8 cm. There are no aggressive appearing lytic or blastic lesions noted in the visualized portions of the skeleton. CT ABDOMEN PELVIS FINDINGS Hepatobiliary: Diffuse low attenuation throughout the hepatic parenchyma, indicative of hepatic steatosis. No definite  suspicious cystic or solid hepatic lesions are confidently identified on today's noncontrast CT examination. Intermediate attenuation lying dependently in the gallbladder, likely to represent tiny noncalcified gallstones. No findings to  suggest an acute cholecystitis at this time. Pancreas: No pancreatic mass. No pancreatic ductal dilatation. No pancreatic or peripancreatic fluid collections or inflammatory changes. Spleen: Unremarkable. Adrenals/Urinary Tract: Nonobstructive calculi in the lower pole collecting system of left kidney measuring up to 4 mm. In addition, there is a 1 cm calculus at the left ureteropelvic junction associated with mild-to-moderate proximal hydronephrosis and extensive perinephric stranding indicating obstruction at this time. No additional calculi are noted in the collecting system of the right kidney, along the course of the right ureter or within the lumen of the urinary bladder. No right hydroureteronephrosis. Unenhanced appearance of the kidneys is otherwise unremarkable. Urinary bladder is nearly decompressed, but otherwise unremarkable in appearance. Bilateral adrenal glands are normal in appearance. Stomach/Bowel: Unenhanced appearance of the stomach is normal. No pathologic dilatation of small bowel or colon. The appendix is not confidently identified and may be surgically absent. Regardless, there are no inflammatory changes noted adjacent to the cecum to suggest the presence of an acute appendicitis at this time. 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 No lymphadenopathy noted in the abdomen or pelvis. Reproductive: Prostate gland and seminal vesicles are unremarkable in appearance. Other: No significant volume of ascites.  No pneumoperitoneum. Musculoskeletal:  There are no aggressive appearing lytic or blastic lesions noted in the visualized portions of the skeleton. IMPRESSION: 1. 1 cm calculus at the left ureteropelvic junction with mild to moderate proximal left hydronephrosis and extensive perinephric stranding indicative of obstruction. 2. No acute findings noted in the thorax. 3. 2 additional nonobstructive calculi in the lower pole collecting system of left kidney measuring up to 4 mm. 4. Aortic atherosclerosis, in addition to left main and 3 vessel coronary artery disease. Assessment for potential risk factor modification, dietary therapy or pharmacologic therapy may be warranted, if clinically indicated. 5. Probable cholelithiasis without evidence of acute cholecystitis at this time. 6. Mild cardiomegaly. 7. 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 Electronically Signed   By: Vinnie Langton M.D.   On: 06/24/2019 15:16   Dg Chest Portable 1 View  Result Date: 06/24/2019 CLINICAL DATA:  Shortness of breath. EXAM: PORTABLE CHEST 1 VIEW COMPARISON:  None. FINDINGS: Lung volumes are low but the lungs are clear. Heart size is upper normal. No pneumothorax or pleural fluid. No acute or focal bony abnormality. IMPRESSION: No acute disease. Electronically Signed   By: Inge Rise M.D.   On: 06/24/2019 12:22    Assessment/Plan: 1.  Severe sepsis secondary to an obstructing 1 cm left UPJ stone s/p left PCN- blood and urine cultures are pending.  He is currently on empiric cefepime and Cipro.  He will need a total of 2 weeks of antibiotic coverage before any type of stone intervention will be pursued.  Okay to remove Foley catheter from a GU standpoint.  Will continue to monitor   LOS: 1 day   Patrick Hughs, MD [facility] Pager: [phone]  06/25/2019, 3:12 AM

## 2019-06-25 NOTE — Progress Notes (Addendum)
Paged [facility] about patient's order for 150cc/hr Normal saline. Patient received 3.5L in ED. Has a history of CHF. Patient is currently on 6L via nasal cannula, sats 91-93%. Patient states that his breathing is fine. Audible wheezing noted. Upon arrival to floor, nephro tube emptied out 300cc, while foley emptied out 200cc. Awaiting new orders or call back.  0624- New orders noted to cut IVF down to 75cc/hr instead of 150cc/hr. Patient resting at this time with eyes closed. Easily arousable. 02 sat 95% on 6L [place]

## 2019-06-25 NOTE — Progress Notes (Signed)
Dr. Florene Glen notified that patient had episode of vomiting and that O2 sats 87%-90% on 6L. He will come to see patient again.

## 2019-06-25 NOTE — Progress Notes (Signed)
Dr. Florene Glen notified that patient is still on 6L [place] and states he feels short of breath. NEBs ordered and will continue to monitor patient.

## 2019-06-25 NOTE — Progress Notes (Signed)
Referring Physician(s): Dr. Lorin Mercy  Supervising Physician: Aletta Edouard  Patient Status:  [facility] - In-pt  Chief Complaint: Follow up left PCN placed 06/24/19 by Dr. Earleen Newport  Subjective:  Patient laying in bed watching TV, breakfast tray at bedside with small amount eaten. Patient denies pain today, he states he is not that hungry but is very thirsty.   Allergies: Aldactone [spironolactone]  Medications: Prior to Admission medications   Medication Sig Start Date End Date Taking? Authorizing Provider  amLODipine (NORVASC) 10 MG tablet TAKE 1 TABLET DAILY Patient taking differently: Take 10 mg by mouth daily.  06/04/19  Yes [name], MD  atorvastatin (LIPITOR) 10 MG tablet TAKE 1 TABLET DAILY AT 6 P.M. Patient taking differently: Take 10 mg by mouth daily.  11/30/18  Yes [name], MD  furosemide (LASIX) 40 MG tablet Take  20 mg  ( 1/2 tablet) twice a day except on Monday and Thursday take 40 mg twice a day. Patient taking differently: Take 20-40 mg by mouth See admin instructions. Take  20 mg  ( 1/2 tablet) twice a day except on Monday and Thursday take 40 mg twice a day. 07/02/18  Yes [name], MD  HYDROcodone-acetaminophen (NORCO/VICODIN) 5-325 MG tablet Take 1 tablet by mouth 2 (two) times daily.  09/04/17  Yes [provider]  metoprolol tartrate (LOPRESSOR) 25 MG tablet TAKE ONE TABLET (25 MG TOTAL) EVERY MORNING AND ONE AND ONE-HALF TABLETS (37.5 MG TOTAL) EVERY EVENING Patient taking differently: Take 25-37.5 mg by mouth See admin instructions. Take 25mg  in the morning and 37.5mg  in the evening. 04/19/19  Yes [name], MD  XARELTO 20 MG TABS tablet TAKE 1 TABLET DAILY WITH SUPPER Patient taking differently: Take 20 mg by mouth daily with supper.  11/09/18  Yes [name], MD     Vital Signs: BP 109/64    Pulse (!) 129    Temp 98.3 F (36.8 C) (Oral)    Resp (!) 28    Ht 6\' 1"  (1.854 m)    Wt (!) 386 lb 0.4 oz (175.1 kg)    SpO2 93%     BMI 50.93 kg/m   Physical Exam Vitals signs and nursing note reviewed.  Constitutional:      General: He is not in acute distress.    Appearance: He is obese.  Cardiovascular:     Rate and Rhythm: Tachycardia present.  Pulmonary:     Effort: Pulmonary effort is normal.  Abdominal:     General: There is no distension.     Palpations: Abdomen is soft.     Tenderness: There is no abdominal tenderness.  Genitourinary:    Comments: (+) Left PCN to gravity with ~ 100 cc clear, dark yellow urine. Insertion site clean, dry , dressed appropriately. Suture and stat lock in tact. No leakage of fluid or active bleeding noted. No pain to palpation.  Skin:    General: Skin is warm and dry.  Neurological:     Mental Status: He is alert. Mental status is at baseline.     Imaging: Ct Abdomen Pelvis Wo Contrast  Result Date: 06/24/2019 CLINICAL DATA:  78 year old male with recent history of abdominal pain. EXAM: CT CHEST, ABDOMEN AND PELVIS WITHOUT CONTRAST TECHNIQUE: Multidetector CT imaging of the chest, abdomen and pelvis was performed following the standard protocol without IV contrast. COMPARISON:  No priors. FINDINGS: CT CHEST FINDINGS Cardiovascular: Heart size is mildly enlarged. There is no significant pericardial fluid,  thickening or pericardial calcification. There is aortic atherosclerosis, as well as atherosclerosis of the great vessels of the mediastinum and the coronary arteries, including calcified atherosclerotic plaque in the left main, left anterior descending, left circumflex and right coronary arteries. Mediastinum/Nodes: No pathologically enlarged mediastinal or hilar lymph nodes. Please note that accurate exclusion of hilar adenopathy is limited on noncontrast CT scans. Esophagus is unremarkable in appearance. No axillary lymphadenopathy. Lungs/Pleura: Areas of scarring or subsegmental atelectasis in the lower lobes of the lungs bilaterally. A few scattered small pulmonary nodules  are noted in the lungs, largest of which is in the right middle lobe (axial image 84 of series 4) measuring 5 mm. No other larger more suspicious appearing pulmonary nodules or masses are noted. No acute consolidative airspace disease. No pleural effusions. Musculoskeletal: Large fatty attenuation lesion in the left chest wall extending into the left axillary region, incompletely imaged, but measuring at least 22.5 x 14.8 cm. There are no aggressive appearing lytic or blastic lesions noted in the visualized portions of the skeleton. CT ABDOMEN PELVIS FINDINGS Hepatobiliary: Diffuse low attenuation throughout the hepatic parenchyma, indicative of hepatic steatosis. No definite suspicious cystic or solid hepatic lesions are confidently identified on today's noncontrast CT examination. Intermediate attenuation lying dependently in the gallbladder, likely to represent tiny noncalcified gallstones. No findings to suggest an acute cholecystitis at this time. Pancreas: No pancreatic mass. No pancreatic ductal dilatation. No pancreatic or peripancreatic fluid collections or inflammatory changes. Spleen: Unremarkable. Adrenals/Urinary Tract: Nonobstructive calculi in the lower pole collecting system of left kidney measuring up to 4 mm. In addition, there is a 1 cm calculus at the left ureteropelvic junction associated with mild-to-moderate proximal hydronephrosis and extensive perinephric stranding indicating obstruction at this time. No additional calculi are noted in the collecting system of the right kidney, along the course of the right ureter or within the lumen of the urinary bladder. No right hydroureteronephrosis. Unenhanced appearance of the kidneys is otherwise unremarkable. Urinary bladder is nearly decompressed, but otherwise unremarkable in appearance. Bilateral adrenal glands are normal in appearance. Stomach/Bowel: Unenhanced appearance of the stomach is normal. No pathologic dilatation of small bowel or  colon. The appendix is not confidently identified and may be surgically absent. Regardless, there are no inflammatory changes noted adjacent to the cecum to suggest the presence of an acute appendicitis at this time. 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 No lymphadenopathy noted in the abdomen or pelvis. Reproductive: Prostate gland and seminal vesicles are unremarkable in appearance. Other: No significant volume of ascites.  No pneumoperitoneum. Musculoskeletal: There are no aggressive appearing lytic or blastic lesions noted in the visualized portions of the skeleton. IMPRESSION: 1. 1 cm calculus at the left ureteropelvic junction with mild to moderate proximal left hydronephrosis and extensive perinephric stranding indicative of obstruction. 2. No acute findings noted in the thorax. 3. 2 additional nonobstructive calculi in the lower pole collecting system of left kidney measuring up to 4 mm. 4. Aortic atherosclerosis, in addition to left main and 3 vessel coronary artery disease. Assessment for potential risk factor modification, dietary therapy or pharmacologic therapy may be warranted, if clinically indicated. 5. Probable cholelithiasis without evidence of acute cholecystitis at this time. 6. Mild cardiomegaly. 7. 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 Electronically Signed  By: Vinnie Langton M.D.   On: 06/24/2019 15:16   Ct Chest Wo Contrast  Result Date: 06/24/2019 CLINICAL DATA:  78 year old male with recent history of abdominal pain. EXAM: CT CHEST, ABDOMEN AND PELVIS WITHOUT CONTRAST TECHNIQUE: Multidetector CT imaging of the chest, abdomen and pelvis was performed following the standard protocol without IV contrast. COMPARISON:  No  priors. FINDINGS: CT CHEST FINDINGS Cardiovascular: Heart size is mildly enlarged. There is no significant pericardial fluid, thickening or pericardial calcification. There is aortic atherosclerosis, as well as atherosclerosis of the great vessels of the mediastinum and the coronary arteries, including calcified atherosclerotic plaque in the left main, left anterior descending, left circumflex and right coronary arteries. Mediastinum/Nodes: No pathologically enlarged mediastinal or hilar lymph nodes. Please note that accurate exclusion of hilar adenopathy is limited on noncontrast CT scans. Esophagus is unremarkable in appearance. No axillary lymphadenopathy. Lungs/Pleura: Areas of scarring or subsegmental atelectasis in the lower lobes of the lungs bilaterally. A few scattered small pulmonary nodules are noted in the lungs, largest of which is in the right middle lobe (axial image 84 of series 4) measuring 5 mm. No other larger more suspicious appearing pulmonary nodules or masses are noted. No acute consolidative airspace disease. No pleural effusions. Musculoskeletal: Large fatty attenuation lesion in the left chest wall extending into the left axillary region, incompletely imaged, but measuring at least 22.5 x 14.8 cm. There are no aggressive appearing lytic or blastic lesions noted in the visualized portions of the skeleton. CT ABDOMEN PELVIS FINDINGS Hepatobiliary: Diffuse low attenuation throughout the hepatic parenchyma, indicative of hepatic steatosis. No definite suspicious cystic or solid hepatic lesions are confidently identified on today's noncontrast CT examination. Intermediate attenuation lying dependently in the gallbladder, likely to represent tiny noncalcified gallstones. No findings to suggest an acute cholecystitis at this time. Pancreas: No pancreatic mass. No pancreatic ductal dilatation. No pancreatic or peripancreatic fluid collections or inflammatory changes. Spleen: Unremarkable.  Adrenals/Urinary Tract: Nonobstructive calculi in the lower pole collecting system of left kidney measuring up to 4 mm. In addition, there is a 1 cm calculus at the left ureteropelvic junction associated with mild-to-moderate proximal hydronephrosis and extensive perinephric stranding indicating obstruction at this time. No additional calculi are noted in the collecting system of the right kidney, along the course of the right ureter or within the lumen of the urinary bladder. No right hydroureteronephrosis. Unenhanced appearance of the kidneys is otherwise unremarkable. Urinary bladder is nearly decompressed, but otherwise unremarkable in appearance. Bilateral adrenal glands are normal in appearance. Stomach/Bowel: Unenhanced appearance of the stomach is normal. No pathologic dilatation of small bowel or colon. The appendix is not confidently identified and may be surgically absent. Regardless, there are no inflammatory changes noted adjacent to the cecum to suggest the presence of an acute appendicitis at this time. 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 No lymphadenopathy noted in the abdomen or pelvis. Reproductive: Prostate gland and seminal vesicles are unremarkable in appearance. Other: No significant volume of ascites.  No pneumoperitoneum. Musculoskeletal: There are no aggressive appearing lytic or blastic lesions noted in the visualized portions of the skeleton. IMPRESSION: 1. 1 cm calculus at the left ureteropelvic junction with mild to moderate proximal left hydronephrosis and extensive perinephric stranding indicative of obstruction. 2. No acute findings noted in the thorax. 3. 2  additional nonobstructive calculi in the lower pole collecting system of left kidney measuring up to 4 mm. 4.  Aortic atherosclerosis, in addition to left main and 3 vessel coronary artery disease. Assessment for potential risk factor modification, dietary therapy or pharmacologic therapy may be warranted, if clinically indicated. 5. Probable cholelithiasis without evidence of acute cholecystitis at this time. 6. Mild cardiomegaly. 7. 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 Electronically Signed   By: Vinnie Langton M.D.   On: 06/24/2019 15:16   Ir US Guidance  Result Date: 06/25/2019 INDICATION: 78 year old male with a history of urosepsis structure ureteral stone EXAM: IMAGE GUIDED PERCUTANEOUS NEPHROSTOMY COMPARISON:  None. MEDICATIONS: Ciprofloxacin 400 mg IV; The antibiotic was administered in an appropriate time frame prior to skin puncture. ANESTHESIA/SEDATION: None The patient was continuously monitored during the procedure by the interventional radiology nurse under my direct supervision. CONTRAST:  15 cc-administered into the collecting system(s) FLUOROSCOPY TIME:  Fluoroscopy Time: 0 minutes 42 seconds (16.9 mGy). COMPLICATIONS: None PROCEDURE: Informed written consent was obtained from the patient after a thorough discussion of the procedural risks, benefits and alternatives. All questions were addressed. Maximal Sterile Barrier Technique was utilized including caps, mask, sterile gowns, sterile gloves, sterile drape, hand hygiene and skin antiseptic. A timeout was performed prior to the initiation of the procedure. Patient positioned prone position on the fluoroscopy table. Ultrasound survey of the left flank was performed with images stored and sent to PACs. The patient was then prepped and draped in the usual sterile fashion. 1% lidocaine was used to anesthetize the skin and subcutaneous tissues for local anesthesia. A Chiba needle was then used to access a posterior inferior calyx with ultrasound guidance. With spontaneous urine returned through the needle,  passage of an 018 micro wire into the collecting system was performed under fluoroscopy. A small incision was made with an 11 blade scalpel, and the needle was removed from the wire. An Accustick system was then advanced over the wire into the collecting system under fluoroscopy. The metal stiffener and inner dilator were removed, and then a sample of fluid was aspirated through the 4 French outer sheath. Bentson wire was passed into the collecting system and the sheath removed. Ten French dilation of the soft tissues was performed. Using modified Seldinger technique, a 10 French pigtail catheter drain was placed over the Bentson wire. Wire and inner stiffener removed, and the pigtail was formed in the collecting system. Sample was sent for culture. Small amount of contrast confirmed position of the catheter. Patient tolerated the procedure well and remained hemodynamically stable throughout. No complications were encountered and no significant blood loss encountered IMPRESSION: Status post image guided left-sided percutaneous nephrostomy. Signed, Dulcy Fanny. Dellia Nims, RPVI [facility] Radiology Electronically Signed   By: Corrie Mckusick D.O.   On: 06/25/2019 08:15   Dg Chest Port 1 View  Result Date: 06/25/2019 CLINICAL DATA:  Hypoxia EXAM: PORTABLE CHEST 1 VIEW COMPARISON:  06/24/2019 FINDINGS: Cardiac shadow remains enlarged but accentuated by the portable technique. The lungs are well aerated bilaterally. No focal infiltrate or sizable effusion is noted. Minimal basilar atelectatic changes are seen. IMPRESSION: Mild basilar atelectasis similar to that seen on prior CT examination. Electronically Signed   By: Inez Catalina M.D.   On: 06/25/2019 09:42   Dg Chest Portable 1 View  Result Date: 06/24/2019 CLINICAL DATA:  Shortness of breath. EXAM: PORTABLE CHEST 1 VIEW COMPARISON:  None. FINDINGS: Lung  volumes are low but the lungs are clear. Heart size is upper  normal. No pneumothorax or pleural fluid. No acute or focal bony abnormality. IMPRESSION: No acute disease. Electronically Signed   By: Inge Rise M.D.   On: 06/24/2019 12:22   Ir Nephrostomy Placement Left  Result Date: 06/25/2019 INDICATION: 78 year old male with a history of urosepsis structure ureteral stone EXAM: IMAGE GUIDED PERCUTANEOUS NEPHROSTOMY COMPARISON:  None. MEDICATIONS: Ciprofloxacin 400 mg IV; The antibiotic was administered in an appropriate time frame prior to skin puncture. ANESTHESIA/SEDATION: None The patient was continuously monitored during the procedure by the interventional radiology nurse under my direct supervision. CONTRAST:  15 cc-administered into the collecting system(s) FLUOROSCOPY TIME:  Fluoroscopy Time: 0 minutes 42 seconds (16.9 mGy). COMPLICATIONS: None PROCEDURE: Informed written consent was obtained from the patient after a thorough discussion of the procedural risks, benefits and alternatives. All questions were addressed. Maximal Sterile Barrier Technique was utilized including caps, mask, sterile gowns, sterile gloves, sterile drape, hand hygiene and skin antiseptic. A timeout was performed prior to the initiation of the procedure. Patient positioned prone position on the fluoroscopy table. Ultrasound survey of the left flank was performed with images stored and sent to PACs. The patient was then prepped and draped in the usual sterile fashion. 1% lidocaine was used to anesthetize the skin and subcutaneous tissues for local anesthesia. A Chiba needle was then used to access a posterior inferior calyx with ultrasound guidance. With spontaneous urine returned through the needle, passage of an 018 micro wire into the collecting system was performed under fluoroscopy. A small incision was made with an 11 blade scalpel, and the needle was removed from the wire. An Accustick system was then advanced over the wire into the collecting system under fluoroscopy. The metal  stiffener and inner dilator were removed, and then a sample of fluid was aspirated through the 4 French outer sheath. Bentson wire was passed into the collecting system and the sheath removed. Ten French dilation of the soft tissues was performed. Using modified Seldinger technique, a 10 French pigtail catheter drain was placed over the Bentson wire. Wire and inner stiffener removed, and the pigtail was formed in the collecting system. Sample was sent for culture. Small amount of contrast confirmed position of the catheter. Patient tolerated the procedure well and remained hemodynamically stable throughout. No complications were encountered and no significant blood loss encountered IMPRESSION: Status post image guided left-sided percutaneous nephrostomy. Signed, Dulcy Fanny. Dellia Nims, RPVI [facility] Radiology Electronically Signed   By: Corrie Mckusick D.O.   On: 06/25/2019 08:15    Labs:  CBC: Recent Labs    06/24/19 1158 06/25/19 0816  WBC 9.4 12.1*  HGB 13.0 10.9*  HCT 39.6 32.0*  PLT 149* 144*    COAGS: Recent Labs    06/24/19 1158  INR 2.2*    BMP: Recent Labs    06/24/19 1158 06/25/19 0357  NA 141 138  K 3.1* 4.9  CL 110 111  CO2 17* 14*  GLUCOSE 122* 97  BUN 28* 35*  CALCIUM 8.6* 7.4*  CREATININE 2.26* 1.77*  GFRNONAA 27* 36*  GFRAA 31* 41*    LIVER FUNCTION TESTS: Recent Labs    06/24/19 1158  BILITOT 2.0*  AST 41  ALT 19  ALKPHOS 59  PROT 6.7  ALBUMIN 3.4*    Assessment and Plan:  78 y/o M admitted for urosepsis s/p left PCN placement yesterday by Dr. Earleen Newport. Insertion site is unremarkable,  draining clear, dark yellow urine on exam today. No leakage of fluids or bleeding noted. Per I/OP 825 cc output since placement. Culture of aspirate pending - currently receiving Rocephin per primary team.   Continue current drain management - do not flush, dressing changes QD, record output Qshift.   Further plans  per urology/primary team. Please call IR with questions or concerns.    Electronically Signed: Joaquim Nam, PA-C 06/25/2019, 10:46 AM   I spent a total of 15 Minutes at the the patient's bedside AND on the patient's hospital floor or unit, greater than 50% of which was counseling/coordinating care for left PCN follow up.

## 2019-06-25 NOTE — Progress Notes (Signed)
*  PRELIMINARY RESULTS* Echocardiogram 2D Echocardiogram has been performed.  Patrick Bishop 06/25/2019, 10:06 AM

## 2019-06-25 NOTE — Progress Notes (Signed)
Spoke with patient about CPAP at home.  Patient stated he could not remember pressure setting for his home machine.  He stated that he used a nasal mask at home though.  I asked about using one of our machines tonight, and remembered that patient had spoke about having N/V today.  I told him that I could put the machine in his room if he felt like he could use it and I could set it up for him.  Patient shook is head no and stated that he did not feel well enough to go on machine tonight.  He was afraid he would have more N/V and had an emesis bag at bedside as he talked to me.  I said that would be fine, and will relate to staff in the morning for an RT to check back with patient tomorrow night about CPAP.

## 2019-06-25 NOTE — Progress Notes (Addendum)
PROGRESS NOTE    Patrick Bishop  JQZ:[id number] DOB: May 11, 1941 DOA: 06/24/2019 PCP: Lujean Amel, MD   Brief Narrative:  Patrick Bishop is Patrick Bishop 78 y.o. male with medical history significant of OSA on CPAP; pulmonary HTN; obesity; afib; HTN; HLD; PVD s/p stents; and chronic diastolic CHF presenting with abdominal pain.  He reports continuous suprapubic pain without other urinary symptoms.  ?fever at home, some chills.  He developed n/v Patrick Bishop couple of days ago and has been unable to take his meds.  He felt SOB today.   ED Course:  Sick - SOB and abdominal pain.  88% on RA, placed on 4L.  Has afib, unable to take meds due to vomiting.  Appears to have sepsis from Chanese Hartsough urinary source with an obstructing stone.  Lactate 5.6, down to 3.  HS troponin 500.  AKI.  Dr. Lovena Neighbours - needs to go to the OR for Parthiv Mucci stent, cannot go to [facility].  Cards will also see.   Likely ok for SDU.  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  Pt appears stable on 6 L satting in low 90's.  CXR earlier today with mild basilar atelectasis.  Will give lasix 40 mg x 1 and follow UOP.  Discussed with nursing.  Septic shock due to Proteus bacteremia 2/2 UTI associated with an obstructing stone   Proteus Bacteremia 2/2 UTI  -SIRS criteria in this patient includes: Fever, tachycardia, tachypnea, hypoxia  - Sepsis physiology improving - lactate resolved - Blood cx pending (both growing gram negative rods - BCID with proteus) - urine cx with >100,000  gram negative rods - Narrowed to ceftriaxone, appreciate pharmacy assistance - Follow HIV - S/p L nephrostomy placement with IR 9/28 - Urology following - pt needs 2 weeks abx coverage before any stone intervention will be pursued - ok to remove foley - will d/c this today - follow bladder scans - PCCM was c/s on arrival and noted pt should be fine for progressive care - call if needed  Afib with RVR -Patient with known afib -HR improved today -Cardiology - recommends resume home metop 25 mg daily - recommended to discuss with urology when to resume home anticoagulation - per Dr. Lovena Neighbours, would be ok to start 9/30  Elevated troponin -Markedly elevated troponin with increasing delta -Cardiology is consulting, plans for echo 9/29- no sx to suggest ACS, suspect demand in setting of shock -Last cath was in 2011(?) in [place] with mild non-obstructive CAD - follow echo  Acute Kidney Injury   NAGMA -No known baseline creatinine known - Improving after treatment of above - Appears overloaded at this time, will hold off on additional IVF for now and follow UOP - Will start PO bicarb for NAGMA likely related to NS resuscitation and AKI  Volume Overload   Acute Hypoxic Respiratory Failure   HFpEF Weight up 10 kg with resuscitation from day of admission to hospital day 1 CT chest without acute findings in the thorax - this  was non contrast - he was anticoagulated on presentation, VTE less likely Follow echo from today Currently on 6 L [place], will wean as tolerated Expect he'll likely need diuresis over next few days, but will hold for now with recent obstruction and AKI I/O, daily weights CXR 9/29 with mild basilar atelectasis  HTN -Hold PO Norvasc.  Metop restarted today.  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 -His daughters reports that he gets skin breakdown and ulceration from SCDs -Will use footpulse for now  PVD s/p  stenting -CAD seen on imaging on admission -Needs CVD RF modification  OSA on CPAP -Continue CPAP  Morbid obesity -BMI 48.8 -Weight loss should be encouraged -Outpatient PCP/bariatric medicine/bariatric surgery f/u encouraged  Cholelithiasis -Incidental finding on imaging today -Outpatient f/u unless this becomes symptomatic  Anemia: Likely dilutional with resuscitation yesterday Follow PM H/H  DVT prophylaxis: foot pump/plexipulse Code Status: full  Family Communication: none at bedside - discussed with daughter Disposition Plan: pending further improvement  Consultants:   IR  Urology  Cardiology  PCCM   Procedures:  L nephrostomy placement 9/28 by IR  Antimicrobials:  Anti-infectives (From admission, onward)   Start     Dose/Rate Route Frequency Ordered Stop   06/25/19 0700  cefTRIAXone (ROCEPHIN) 2 g in sodium chloride 0.9 % 100 mL IVPB     2 g 200 mL/hr over 30 Minutes Intravenous Daily 06/25/19 0520     06/25/19 0630  ceFEPIme (MAXIPIME) 2 g in sodium chloride 0.9 % 100 mL IVPB  Status:  Discontinued     2 g 200 mL/hr over 30 Minutes Intravenous Every 12 hours 06/24/19 1818 06/25/19 0518   06/24/19 1715  ciprofloxacin (CIPRO) IVPB 400 mg  Status:  Discontinued     400 mg 200 mL/hr over 60 Minutes Intravenous Every 12 hours 06/24/19 1707 06/25/19 0924   06/24/19 1702  ciprofloxacin (CIPRO) 400 MG/200ML IVPB    Note to Pharmacy: [name]   : cabinet override      06/24/19 1702 06/25/19 0514   06/24/19 1700  ceFEPIme (MAXIPIME) 2 g in sodium chloride 0.9 % 100 mL IVPB     2 g 200 mL/hr over 30 Minutes Intravenous  Once 06/24/19 1653 06/24/19 2115   06/24/19 1400  cefTRIAXone (ROCEPHIN) 1 g in sodium chloride 0.9 % 100 mL IVPB  Status:  Discontinued     1 g 200 mL/hr over 30 Minutes Intravenous Every 24 hours 06/24/19 1349 06/24/19 1708     Subjective: Feeling about the same Doesn't feel great No specific complaints  Objective: Vitals:    06/25/19 0500 06/25/19 0522 06/25/19 0600 06/25/19 0840  BP:  105/67  109/64  Pulse:    (!) 129  Resp:    (!) 28  Temp:  98.3 F (36.8 C)    TempSrc:  Oral    SpO2:  91% 93%   Weight: (!) 175.1 kg     Height:        Intake/Output Summary (Last 24 hours) at 06/25/2019 1124 Last data filed at 06/25/2019 0845 Gross per 24 hour  Intake 2728.34 ml  Output 825 ml  Net 1903.34 ml   Filed Weights   06/24/19 1800 06/25/19 0500  Weight: (!) 165.6 kg (!) 175.1 kg    Examination:  General exam: Appears calm and comfortable  Respiratory system: Clear to auscultation. Respiratory effort normal. Cardiovascular system: S1 & S2 heard, RRR Gastrointestinal system: Abdomen is nondistended, soft and  nontender.  L Nephrostomy tube in L flank Central nervous system: Alert and oriented. No focal neurological deficits. Extremities: bilateral LEE 2+ Skin: No rashes, lesions or ulcers Psychiatry: Judgement and insight appear normal. Mood & affect appropriate.     Data Reviewed: I have personally reviewed following labs and imaging studies  CBC: Recent Labs  Lab 06/24/19 1158 06/25/19 0816  WBC 9.4 12.1*  NEUTROABS 8.5*  --   HGB 13.0 10.9*  HCT 39.6 32.0*  MCV 95.0 93.8  PLT 149* 893*   Basic Metabolic Panel: Recent Labs  Lab 06/24/19 1158 06/25/19 0357  NA 141 138  K 3.1* 4.9  CL 110 111  CO2 17* 14*  GLUCOSE 122* 97  BUN 28* 35*  CREATININE 2.26* 1.77*  CALCIUM 8.6* 7.4*  MG  --  2.0   GFR: Estimated Creatinine Clearance: 56.5 mL/min (Judeth Gilles) (by C-G formula based on SCr of 1.77 mg/dL (H)). Liver Function Tests: Recent Labs  Lab 06/24/19 1158  AST 41  ALT 19  ALKPHOS 59  BILITOT 2.0*  PROT 6.7  ALBUMIN 3.4*   Recent Labs  Lab 06/24/19 1158  LIPASE 20   No results for input(s): AMMONIA in the last 168 hours. Coagulation Profile: Recent Labs  Lab 06/24/19 1158  INR 2.2*   Cardiac Enzymes: No results for input(s): CKTOTAL, CKMB, CKMBINDEX, TROPONINI in the  last 168 hours. BNP (last 3 results) No results for input(s): PROBNP in the last 8760 hours. HbA1C: No results for input(s): HGBA1C in the last 72 hours. CBG: No results for input(s): GLUCAP in the last 168 hours. Lipid Profile: No results for input(s): CHOL, HDL, LDLCALC, TRIG, CHOLHDL, LDLDIRECT in the last 72 hours. Thyroid Function Tests: No results for input(s): TSH, T4TOTAL, FREET4, T3FREE, THYROIDAB in the last 72 hours. Anemia Panel: No results for input(s): VITAMINB12, FOLATE, FERRITIN, TIBC, IRON, RETICCTPCT in the last 72 hours. Sepsis Labs: Recent Labs  Lab 06/24/19 1400 06/24/19 1840 06/24/19 1841 06/24/19 2043 06/24/19 2306  PROCALCITON  --   --  57.08  --   --   LATICACIDVEN 3.0* 2.4*  --  2.5* 1.9    Recent Results (from the past 240 hour(s))  Culture, blood (routine x 2)     Status: None (Preliminary result)   Collection Time: 06/24/19 11:58 AM   Specimen: BLOOD RIGHT HAND  Result Value Ref Range Status   Specimen Description BLOOD RIGHT HAND  Final   Special Requests   Final    BOTTLES DRAWN AEROBIC AND ANAEROBIC Blood Culture adequate volume   Culture  Setup Time   Final    IN BOTH AEROBIC AND ANAEROBIC BOTTLES GRAM NEGATIVE RODS CRITICAL RESULT CALLED TO, READ BACK BY AND VERIFIED WITH: Salli Real [id number] 06/25/2019 Mena Goes Performed at [facility], [address], [place], [place] [address]    Culture GRAM NEGATIVE RODS  Final   Report Status PENDING  Incomplete  Blood Culture ID Panel (Reflexed)     Status: Abnormal   Collection Time: 06/24/19 11:58 AM  Result Value Ref Range Status   Enterococcus species NOT DETECTED NOT DETECTED Final   Listeria monocytogenes NOT DETECTED NOT DETECTED Final   Staphylococcus species NOT DETECTED NOT DETECTED Final   Staphylococcus aureus (BCID) NOT DETECTED NOT DETECTED Final   Streptococcus species NOT DETECTED NOT DETECTED Final   Streptococcus agalactiae NOT DETECTED NOT DETECTED Final    Streptococcus pneumoniae NOT DETECTED NOT DETECTED Final   Streptococcus pyogenes NOT DETECTED NOT DETECTED  Final   Acinetobacter baumannii NOT DETECTED NOT DETECTED Final   Enterobacteriaceae species DETECTED (Sabino Denning) NOT DETECTED Final    Comment: Enterobacteriaceae represent Amoree Newlon large family of gram-negative bacteria, not Fay Bagg single organism. CRITICAL RESULT CALLED TO, READ BACK BY AND VERIFIED WITH: G. ABBOTT,PHARMD [id number] 06/25/2019 T. TYSOR    Enterobacter cloacae complex NOT DETECTED NOT DETECTED Final   Escherichia coli NOT DETECTED NOT DETECTED Final   Klebsiella oxytoca NOT DETECTED NOT DETECTED Final   Klebsiella pneumoniae NOT DETECTED NOT DETECTED Final   Proteus species DETECTED (Anagabriela Jokerst) NOT DETECTED Final    Comment: CRITICAL RESULT CALLED TO, READ BACK BY AND VERIFIED WITH: G. ABBOTT,PHARMD [id number] 06/25/2019 T. TYSOR    Serratia marcescens NOT DETECTED NOT DETECTED Final   Carbapenem resistance NOT DETECTED NOT DETECTED Final   Haemophilus influenzae NOT DETECTED NOT DETECTED Final   Neisseria meningitidis NOT DETECTED NOT DETECTED Final   Pseudomonas aeruginosa NOT DETECTED NOT DETECTED Final   Candida albicans NOT DETECTED NOT DETECTED Final   Candida glabrata NOT DETECTED NOT DETECTED Final   Candida krusei NOT DETECTED NOT DETECTED Final   Candida parapsilosis NOT DETECTED NOT DETECTED Final   Candida tropicalis NOT DETECTED NOT DETECTED Final    Comment: Performed at [facility], [place] [address] [place], [place] [address]  SARS Coronavirus 2 [facility] order, Performed in [facility] hospital lab) Nasopharyngeal Nasopharyngeal Swab     Status: None   Collection Time: 06/24/19 11:59 AM   Specimen: Nasopharyngeal Swab  Result Value Ref Range Status   SARS Coronavirus 2 NEGATIVE NEGATIVE Final    Comment: (NOTE) If result is NEGATIVE SARS-CoV-2 target nucleic acids are NOT DETECTED. The SARS-CoV-2 RNA is generally detectable in upper and lower  respiratory specimens during  the acute phase of infection. The lowest  concentration of SARS-CoV-2 viral copies this assay can detect is 250  copies / mL. Nikoloz Huy negative result does not preclude SARS-CoV-2 infection  and should not be used as the sole basis for treatment or other  patient management decisions.  Breda Bond negative result may occur with  improper specimen collection / handling, submission of specimen other  than nasopharyngeal swab, presence of viral mutation(s) within the  areas targeted by this assay, and inadequate number of viral copies  (<250 copies / mL). Julane Crock negative result must be combined with clinical  observations, patient history, and epidemiological information. If result is POSITIVE SARS-CoV-2 target nucleic acids are DETECTED. The SARS-CoV-2 RNA is generally detectable in upper and lower  respiratory specimens dur ing the acute phase of infection.  Positive  results are indicative of active infection with SARS-CoV-2.  Clinical  correlation with patient history and other diagnostic information is  necessary to determine patient infection status.  Positive results do  not rule out bacterial infection or co-infection with other viruses. If result is PRESUMPTIVE POSTIVE SARS-CoV-2 nucleic acids MAY BE PRESENT.   Amarisa Wilinski presumptive positive result was obtained on the submitted specimen  and confirmed on repeat testing.  While 2019 novel coronavirus  (SARS-CoV-2) nucleic acids may be present in the submitted sample  additional confirmatory testing may be necessary for epidemiological  and / or clinical management purposes  to differentiate between  SARS-CoV-2 and other Sarbecovirus currently known to infect humans.  If clinically indicated additional testing with an alternate test  methodology [phone]) is advised. The SARS-CoV-2 RNA is generally  detectable in upper and lower respiratory sp ecimens during the acute  phase of infection. The expected  result is Negative. Fact Sheet for Patients:   StrictlyIdeas.no Fact Sheet for Healthcare Providers: BankingDealers.co.za This test is not yet approved or cleared by the [place] FDA and has been authorized for detection and/or diagnosis of SARS-CoV-2 by FDA under an Emergency Use Authorization (EUA).  This EUA will remain in effect (meaning this test can be used) for the duration of the COVID-19 declaration under Section 564(b)(1) of the Act, 21 U.S.C. section 360bbb-3(b)(1), unless the authorization is terminated or revoked sooner. Performed at [facility], [place] [address], [place], [place] [address]   Culture, blood (routine x 2)     Status: None (Preliminary result)   Collection Time: 06/24/19  2:10 PM   Specimen: BLOOD LEFT HAND  Result Value Ref Range Status   Specimen Description BLOOD LEFT HAND  Final   Special Requests   Final    BOTTLES DRAWN AEROBIC AND ANAEROBIC Blood Culture adequate volume   Culture  Setup Time   Final    GRAM NEGATIVE RODS ANAEROBIC BOTTLE ONLY CRITICAL RESULT CALLED TO, READ BACK BY AND VERIFIED WITH: PHARMD J LEDFORD [id number] AT 642 AM BY CM    Culture   Final    NO GROWTH < 24 HOURS Performed at [facility], [place] [address] [place], [place] [address]    Report Status PENDING  Incomplete  Urine culture     Status: Abnormal (Preliminary result)   Collection Time: 06/24/19  2:30 PM   Specimen: Urine, Catheterized  Result Value Ref Range Status   Specimen Description URINE, CATHETERIZED  Final   Special Requests   Final    NONE Performed at [facility], [place] [address] [place], [place] [address]    Culture >=100,000 COLONIES/mL GRAM NEGATIVE RODS (Aune Adami)  Final   Report Status PENDING  Incomplete  Aerobic/Anaerobic Culture (surgical/deep wound)     Status: None (Preliminary result)   Collection Time: 06/24/19  5:44 PM   Specimen: Abscess  Result Value Ref Range Status   Specimen Description ABSCESS LEFT PCN  Final    Special Requests NONE  Final   Gram Stain   Final    ABUNDANT WBC PRESENT,BOTH PMN AND MONONUCLEAR ABUNDANT GRAM VARIABLE ROD    Culture   Final    CULTURE REINCUBATED FOR BETTER GROWTH Performed at [facility], [place] [address] [place], [place] [address]    Report Status PENDING  Incomplete  MRSA PCR Screening     Status: None   Collection Time: 06/25/19  5:13 AM   Specimen: Nasal Mucosa; Nasopharyngeal  Result Value Ref Range Status   MRSA by PCR NEGATIVE NEGATIVE Final    Comment:        The GeneXpert MRSA Assay (FDA approved for NASAL specimens only), is one component of [facility] comprehensive MRSA colonization surveillance program. It is not intended to diagnose MRSA infection nor to guide or monitor treatment for MRSA infections. Performed at [facility], [place] [address] [place],  [address]          Radiology Studies: Ct Abdomen Pelvis Wo Contrast  Result Date: 06/24/2019 CLINICAL DATA:  78 year old male with recent history of abdominal pain. EXAM: CT CHEST, ABDOMEN AND PELVIS WITHOUT CONTRAST TECHNIQUE: Multidetector CT imaging of the chest, abdomen and pelvis was performed following the standard protocol without IV contrast. COMPARISON:  No priors. FINDINGS: CT CHEST FINDINGS Cardiovascular: Heart size is mildly enlarged. There is no significant pericardial fluid, thickening or pericardial calcification.  There is aortic atherosclerosis, as well as atherosclerosis of the great vessels of the mediastinum and the coronary arteries, including calcified atherosclerotic plaque in the left main, left anterior descending, left circumflex and right coronary arteries. Mediastinum/Nodes: No pathologically enlarged mediastinal or hilar lymph nodes. Please note that accurate exclusion of hilar adenopathy is limited on noncontrast CT scans. Esophagus is unremarkable in appearance. No axillary lymphadenopathy. Lungs/Pleura: Areas of scarring or subsegmental atelectasis in  the lower lobes of the lungs bilaterally. Shalaina Guardiola few scattered small pulmonary nodules are noted in the lungs, largest of which is in the right middle lobe (axial image 84 of series 4) measuring 5 mm. No other larger more suspicious appearing pulmonary nodules or masses are noted. No acute consolidative airspace disease. No pleural effusions. Musculoskeletal: Large fatty attenuation lesion in the left chest wall extending into the left axillary region, incompletely imaged, but measuring at least 22.5 x 14.8 cm. There are no aggressive appearing lytic or blastic lesions noted in the visualized portions of the skeleton. CT ABDOMEN PELVIS FINDINGS Hepatobiliary: Diffuse low attenuation throughout the hepatic parenchyma, indicative of hepatic steatosis. No definite suspicious cystic or solid hepatic lesions are confidently identified on today's noncontrast CT examination. Intermediate attenuation lying dependently in the gallbladder, likely to represent tiny noncalcified gallstones. No findings to suggest an acute cholecystitis at this time. Pancreas: No pancreatic mass. No pancreatic ductal dilatation. No pancreatic or peripancreatic fluid collections or inflammatory changes. Spleen: Unremarkable. Adrenals/Urinary Tract: Nonobstructive calculi in the lower pole collecting system of left kidney measuring up to 4 mm. In addition, there is Beronica Lansdale 1 cm calculus at the left ureteropelvic junction associated with mild-to-moderate proximal hydronephrosis and extensive perinephric stranding indicating obstruction at this time. No additional calculi are noted in the collecting system of the right kidney, along the course of the right ureter or within the lumen of the urinary bladder. No right hydroureteronephrosis. Unenhanced appearance of the kidneys is otherwise unremarkable. Urinary bladder is nearly decompressed, but otherwise unremarkable in appearance. Bilateral adrenal glands are normal in appearance. Stomach/Bowel: Unenhanced  appearance of the stomach is normal. No pathologic dilatation of small bowel or colon. The appendix is not confidently identified and may be surgically absent. Regardless, there are no inflammatory changes noted adjacent to the cecum to suggest the presence of an acute appendicitis at this time. 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 No lymphadenopathy noted in the abdomen or pelvis. Reproductive: Prostate gland and seminal vesicles are unremarkable in appearance. Other: No significant volume of ascites.  No pneumoperitoneum. Musculoskeletal: There are no aggressive appearing lytic or blastic lesions noted in the visualized portions of the skeleton. IMPRESSION: 1. 1 cm calculus at the left ureteropelvic junction with mild to moderate proximal left hydronephrosis and extensive perinephric stranding indicative of obstruction. 2. No acute findings noted in the thorax. 3. 2 additional nonobstructive calculi in the lower pole collecting system of left kidney measuring up to 4 mm. 4. Aortic atherosclerosis, in addition to left main and 3 vessel coronary artery disease. Assessment for potential risk factor modification, dietary therapy or pharmacologic therapy may be warranted, if clinically indicated. 5. Probable cholelithiasis without evidence of acute cholecystitis at this time. 6. Mild cardiomegaly. 7. Large fatty attenuation lesion in the left chest wall, presumably Malena Timpone large lipoma. Electronically Signed   By: Quillian Quince  Entrikin M.D.   On: 06/24/2019 15:16   Ct Chest Wo Contrast  Result Date: 06/24/2019 CLINICAL DATA:  78 year old male with recent history of abdominal pain. EXAM: CT CHEST, ABDOMEN AND PELVIS WITHOUT CONTRAST TECHNIQUE: Multidetector CT imaging of the chest, abdomen and pelvis was  performed following the standard protocol without IV contrast. COMPARISON:  No priors. FINDINGS: CT CHEST FINDINGS Cardiovascular: Heart size is mildly enlarged. There is no significant pericardial fluid, thickening or pericardial calcification. There is aortic atherosclerosis, as well as atherosclerosis of the great vessels of the mediastinum and the coronary arteries, including calcified atherosclerotic plaque in the left main, left anterior descending, left circumflex and right coronary arteries. Mediastinum/Nodes: No pathologically enlarged mediastinal or hilar lymph nodes. Please note that accurate exclusion of hilar adenopathy is limited on noncontrast CT scans. Esophagus is unremarkable in appearance. No axillary lymphadenopathy. Lungs/Pleura: Areas of scarring or subsegmental atelectasis in the lower lobes of the lungs bilaterally. Sariya Trickey few scattered small pulmonary nodules are noted in the lungs, largest of which is in the right middle lobe (axial image 84 of series 4) measuring 5 mm. No other larger more suspicious appearing pulmonary nodules or masses are noted. No acute consolidative airspace disease. No pleural effusions. Musculoskeletal: Large fatty attenuation lesion in the left chest wall extending into the left axillary region, incompletely imaged, but measuring at least 22.5 x 14.8 cm. There are no aggressive appearing lytic or blastic lesions noted in the visualized portions of the skeleton. CT ABDOMEN PELVIS FINDINGS Hepatobiliary: Diffuse low attenuation throughout the hepatic parenchyma, indicative of hepatic steatosis. No definite suspicious cystic or solid hepatic lesions are confidently identified on today's noncontrast CT examination. Intermediate attenuation lying dependently in the gallbladder, likely to represent tiny noncalcified gallstones. No findings to suggest an acute cholecystitis at this time. Pancreas: No pancreatic mass. No pancreatic ductal dilatation. No pancreatic or  peripancreatic fluid collections or inflammatory changes. Spleen: Unremarkable. Adrenals/Urinary Tract: Nonobstructive calculi in the lower pole collecting system of left kidney measuring up to 4 mm. In addition, there is Marquerite Forsman 1 cm calculus at the left ureteropelvic junction associated with mild-to-moderate proximal hydronephrosis and extensive perinephric stranding indicating obstruction at this time. No additional calculi are noted in the collecting system of the right kidney, along the course of the right ureter or within the lumen of the urinary bladder. No right hydroureteronephrosis. Unenhanced appearance of the kidneys is otherwise unremarkable. Urinary bladder is nearly decompressed, but otherwise unremarkable in appearance. Bilateral adrenal glands are normal in appearance. Stomach/Bowel: Unenhanced appearance of the stomach is normal. No pathologic dilatation of small bowel or colon. The appendix is not confidently identified and may be surgically absent. Regardless, there are no inflammatory changes noted adjacent to the cecum to suggest the presence of an acute appendicitis at this time. 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 No lymphadenopathy noted in the abdomen or pelvis. Reproductive: Prostate gland and seminal vesicles are unremarkable in appearance. Other: No significant volume of ascites.  No pneumoperitoneum. Musculoskeletal: There are no aggressive appearing lytic or blastic lesions noted in the visualized portions of the skeleton. IMPRESSION: 1. 1 cm calculus at the left ureteropelvic junction with mild to moderate proximal left hydronephrosis and extensive perinephric stranding indicative of obstruction. 2. No acute findings noted in the thorax. 3. 2 additional nonobstructive  calculi in the lower pole collecting system of left kidney measuring up to 4 mm. 4. Aortic atherosclerosis, in addition to left main and 3 vessel coronary artery disease. Assessment for potential risk factor modification, dietary therapy or pharmacologic therapy may be warranted, if clinically indicated. 5. Probable cholelithiasis without evidence of acute cholecystitis at this time. 6. Mild cardiomegaly. 7. 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 Electronically Signed   By: Vinnie Langton M.D.   On: 06/24/2019 15:16   Dg Chest Port 1 View  Result Date: 06/25/2019 CLINICAL DATA:  Hypoxia EXAM: PORTABLE CHEST 1 VIEW COMPARISON:  06/24/2019 FINDINGS: Cardiac shadow remains enlarged but accentuated by the portable technique. The lungs are well aerated bilaterally. No focal infiltrate or sizable effusion is noted. Minimal basilar atelectatic changes are seen. IMPRESSION: Mild basilar atelectasis similar to that seen on prior CT examination. Electronically Signed   By: Inez Catalina M.D.   On: 06/25/2019 09:42   Dg Chest Portable 1 View  Result Date: 06/24/2019 CLINICAL DATA:  Shortness of breath. EXAM: PORTABLE CHEST 1 VIEW COMPARISON:  None. FINDINGS: Lung volumes are low but the lungs are clear. Heart size is upper normal. No pneumothorax or pleural fluid. No acute or focal bony abnormality. IMPRESSION: No acute disease. Electronically Signed   By: Inge Rise M.D.   On: 06/24/2019 12:22   Ir Nephrostomy Placement Left  Result Date: 06/25/2019 INDICATION: 78 year old male with Tiera Mensinger history of urosepsis structure ureteral stone EXAM: IMAGE GUIDED PERCUTANEOUS NEPHROSTOMY COMPARISON:  None. MEDICATIONS: Ciprofloxacin 400 mg IV; The antibiotic was administered in an appropriate time frame prior to skin puncture. ANESTHESIA/SEDATION: None The patient was continuously monitored during the procedure by the interventional radiology nurse under my direct supervision.  CONTRAST:  15 cc-administered into the collecting system(s) FLUOROSCOPY TIME:  Fluoroscopy Time: 0 minutes 42 seconds (16.9 mGy). COMPLICATIONS: None PROCEDURE: Informed written consent was obtained from the patient after Dina Mobley thorough discussion of the procedural risks, benefits and alternatives. All questions were addressed. Maximal Sterile Barrier Technique was utilized including caps, mask, sterile gowns, sterile gloves, sterile drape, hand hygiene and skin antiseptic. Rashmi Tallent timeout was performed prior to the initiation of the procedure. Patient positioned prone position on the fluoroscopy table. Ultrasound survey of the left flank was performed with images stored and sent to PACs. The patient was then prepped and draped in the usual sterile fashion. 1% lidocaine was used to anesthetize the skin and subcutaneous tissues for local anesthesia. Jillien Yakel Chiba needle was then used to access Isiaah Cuervo posterior inferior calyx with ultrasound guidance. With spontaneous urine returned through the needle, passage of an 018 micro wire into the collecting system was performed under fluoroscopy. Karcyn Menn small incision was made with an 11 blade scalpel, and the needle was removed from the wire. An Accustick system was then advanced over the wire into the collecting system under fluoroscopy. The metal stiffener and inner dilator were removed, and then Dillon Livermore sample of fluid was aspirated through the 4 French outer sheath. Bentson wire was passed into the collecting system and the sheath removed. Ten French dilation of the soft tissues was performed. Using modified Seldinger technique, Hortencia Martire 10 French pigtail catheter drain was placed over the Bentson wire. Wire and inner stiffener removed, and the pigtail was formed in the collecting system. Sample was sent for culture. Small amount of contrast confirmed position of the catheter. Patient tolerated the procedure well and remained hemodynamically stable throughout. No complications were encountered and  no  significant blood loss encountered IMPRESSION: Status post image guided left-sided percutaneous nephrostomy. Signed, Dulcy Fanny. Dellia Nims, RPVI [facility] Electronically Signed   By: Corrie Mckusick D.O.   On: 06/25/2019 08:15        Scheduled Meds:  atorvastatin  10 mg Oral Daily   Chlorhexidine Gluconate Cloth  6 each Topical Daily   metoprolol succinate  12.5 mg Oral Daily   sodium chloride flush  3 mL Intravenous Q12H   Continuous Infusions:  cefTRIAXone (ROCEPHIN)  IV 2 g (06/25/19 0830)     LOS: 1 day    Time spent: over 30 min    Fayrene Helper, MD [facility] Pager AMION  If 7PM-7AM, please contact night-coverage [email or web address] Password [facility] [place] 06/25/2019, 11:24 AM

## 2019-06-26 ENCOUNTER — Inpatient Hospital Stay (HOSPITAL_COMMUNITY): Payer: Medicare Other

## 2019-06-26 LAB — URINE CULTURE: Culture: >=100000 — AB

## 2019-06-26 LAB — COMPREHENSIVE METABOLIC PANEL
ALT: 30 U/L (ref 0–44)
AST: 85 U/L — ABNORMAL HIGH (ref 15–41)
Albumin: 2.8 g/dL — ABNORMAL LOW (ref 3.5–5.0)
Alkaline Phosphatase: 56 U/L (ref 38–126)
Anion gap: 11 (ref 5–15)
BUN: 39 mg/dL — ABNORMAL HIGH (ref 8–23)
CO2: 24 mmol/L (ref 22–32)
Calcium: 7.8 mg/dL — ABNORMAL LOW (ref 8.9–10.3)
Chloride: 107 mmol/L (ref 98–111)
Creatinine, Ser: 1.58 mg/dL — ABNORMAL HIGH (ref 0.61–1.24)
GFR calc Af Amer: 48 mL/min — ABNORMAL LOW (ref >60)
GFR calc non Af Amer: 41 mL/min — ABNORMAL LOW (ref >60)
Glucose, Bld: 147 mg/dL — ABNORMAL HIGH (ref 70–99)
Potassium: 4.2 mmol/L (ref 3.5–5.1)
Sodium: 142 mmol/L (ref 135–145)
Total Bilirubin: 1.6 mg/dL — ABNORMAL HIGH (ref 0.3–1.2)
Total Protein: 6.2 g/dL — ABNORMAL LOW (ref 6.5–8.1)

## 2019-06-26 LAB — CBC
HCT: 35.3 % — ABNORMAL LOW (ref 39.0–52.0)
Hemoglobin: 11.9 g/dL — ABNORMAL LOW (ref 13.0–17.0)
MCH: 31.2 pg (ref 26.0–34.0)
MCHC: 33.7 g/dL (ref 30.0–36.0)
MCV: 92.7 fL (ref 80.0–100.0)
Platelets: 140 10*3/uL — ABNORMAL LOW (ref 150–400)
RBC: 3.81 MIL/uL — ABNORMAL LOW (ref 4.22–5.81)
RDW: 14.6 % (ref 11.5–15.5)
WBC: 12.7 10*3/uL — ABNORMAL HIGH (ref 4.0–10.5)
nRBC: 0 % (ref 0.0–0.2)

## 2019-06-26 LAB — C DIFFICILE QUICK SCREEN W PCR REFLEX
C Diff antigen: NEGATIVE
C Diff interpretation: NOT DETECTED
C Diff toxin: NEGATIVE

## 2019-06-26 LAB — OCCULT BLOOD X 1 CARD TO LAB, STOOL: Fecal Occult Bld: POSITIVE — AB

## 2019-06-26 LAB — MAGNESIUM: Magnesium: 2.1 mg/dL (ref 1.7–2.4)

## 2019-06-26 MED ORDER — FUROSEMIDE 20 MG PO TABS
20.0000 mg | ORAL_TABLET | Freq: Two times a day (BID) | ORAL | Status: DC
  Administered 2019-06-26 – 2019-07-02 (×13): 20 mg via ORAL
  Filled 2019-06-26 (×12): qty 1

## 2019-06-26 MED ORDER — BISACODYL 10 MG RE SUPP
10.0000 mg | Freq: Every day | RECTAL | Status: DC
  Administered 2019-06-29: 10 mg via RECTAL
  Filled 2019-06-26 (×2): qty 1

## 2019-06-26 MED ORDER — PROCHLORPERAZINE EDISYLATE 10 MG/2ML IJ SOLN
10.0000 mg | INTRAMUSCULAR | Status: DC | PRN
  Administered 2019-06-26: 10 mg via INTRAVENOUS
  Filled 2019-06-26 (×2): qty 2

## 2019-06-26 MED ORDER — PROCHLORPERAZINE EDISYLATE 10 MG/2ML IJ SOLN
5.0000 mg | INTRAMUSCULAR | Status: DC | PRN
  Administered 2019-06-26: 5 mg via INTRAVENOUS
  Filled 2019-06-26 (×2): qty 1

## 2019-06-26 MED ORDER — PROMETHAZINE HCL 25 MG/ML IJ SOLN
12.5000 mg | Freq: Three times a day (TID) | INTRAMUSCULAR | Status: DC | PRN
  Administered 2019-06-26: 12:00:00 12.5 mg via INTRAVENOUS
  Filled 2019-06-26: qty 1

## 2019-06-26 MED ORDER — METOPROLOL SUCCINATE ER 25 MG PO TB24
25.0000 mg | ORAL_TABLET | Freq: Every day | ORAL | Status: DC
  Administered 2019-06-26 – 2019-07-02 (×7): 25 mg via ORAL
  Filled 2019-06-26 (×7): qty 1

## 2019-06-26 MED ORDER — RIVAROXABAN 20 MG PO TABS
20.0000 mg | ORAL_TABLET | Freq: Every day | ORAL | Status: DC
  Administered 2019-06-26 – 2019-06-27 (×2): 20 mg via ORAL
  Filled 2019-06-26 (×2): qty 1

## 2019-06-26 MED ORDER — PROMETHAZINE HCL 25 MG RE SUPP
25.0000 mg | Freq: Three times a day (TID) | RECTAL | Status: DC | PRN
  Filled 2019-06-26: qty 1

## 2019-06-26 NOTE — Progress Notes (Signed)
[name]  PROGRESS NOTE    Patrick Bishop  ZHY:[id number] DOB: 30-Dec-1940 DOA: 06/24/2019 PCP: Lujean Amel, MD   Brief Narrative:   Patrick Bishop a 78 y.o.malewith medical history significant ofOSA on CPAP; pulmonary HTN; obesity; afib; HTN; HLD; PVD s/p stents; and chronic diastolic CHF presenting with abdominal pain.He reports continuous suprapubic pain without other urinary symptoms. ?fever at home, some chills. He developed n/v a couple of days ago and has been unable to take his meds. He felt SOB today.  9/30: N/V last night. Concern for ileus by on-call. Attempted NGT placement, but unsuccessful. Pt refusing further attempts.    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 -    Morbid obesity (HCC)   Atrial fibrillation (HCC)   Hydronephrosis with obstructing calculus   Cholelithiasis   Elevated troponin   Septic shock due to Proteus bacteremia 2/2 UTI associated with an obstructing stone     - SIRS criteria in this patient includes:Fever, tachycardia, tachypnea, hypoxia     - Bld Cx: both growing gram negative rods - BCID with proteus; UCx: proteus     - now on rocephin     - Follow HIV     - S/p L nephrostomy placement with IR 9/28     - Urology following - pt needs 2 weeks abx coverage before any stone intervention will be pursued     - d/c'd foley; follow bladder scans     - PCCM was c/s on arrival and noted pt should be fine for progressive care - call if needed  Afib with RVR     - Patient with known afib     - Cardiology - recommends resume home metop 25 mg daily      - per Dr. Lovena Neighbours, [place] would be ok to start 9/30     - resume home  meds  Elevated troponin     - Markedly elevated troponin with increasing delta     - Cardiology onboard: echo 9/29 noted; appreciate assistance     - Last cath was in 2011(?) in [place] with mild non-obstructive CAD  Acute Kidney Injury   NAGMA     - No known baseline creatinine known     - Improving after treatment of above     - Appears overloaded at this time, will hold off on additional IVF for now and follow UOP     - PO bicarb for NAGMA likely related to NS resuscitation and AKI     - lasix yesterday, SCr improved  Volume Overload   Acute Hypoxic Respiratory Failure   HFpEF     - Weight up 10 kg with resuscitation from day of admission to hospital day 1     - CT chest without acute findings in the thorax - this was non contrast - he was anticoagulated on presentation, VTE less likely     - Currently on 6 L [place], will wean as tolerated     - home lasix is 20mg  PO BID 5 days/week and 40 mg BID 2 days/week     - currently NPO d/t N/V     - start IV lasix  20mg  daily     - I/O, daily weights     - CXR 9/29 with mild basilar atelectasis  HTN     - Hold PO Norvasc.     - metoprolol was resumed  HLD     - Continue Lipitor  Stasis dermatitis     - Marked LE edema which appears to be chronic     - His daughters reports that he gets skin breakdown and ulceration from SCDs     - Will use footpulse for now  PVD s/p stenting     - CAD seen on imaging on admission     - Needs CVD RF modification  OSA on CPAP     - Continue CPAP  Morbid obesity     - BMI48.8     - Weight loss should be encouraged     - Outpatient PCP/bariatric medicine/bariatric surgery f/u encouraged  Cholelithiasis     - Incidental finding on imaging today     - Outpatient f/u unless this becomes symptomatic  N/V     - zofran, phenergan  DVT prophylaxis: Foot pump, plexipulse Code Status: FULL   Disposition Plan: TBD  Consultants:   Cardiology  Urology  Antimicrobials:   Ceftriaxone    ROS:  Reports N, V, dyspnea. Denies CP, palpitations . Remainder 10-pt ROS is negative for all not previously mentioned.  Subjective: "I don't want to do that any more."  Objective: Vitals:   06/26/19 0326 06/26/19 0453 06/26/19 0724 06/26/19 0814  BP: (!) 142/80   118/68  Pulse: (!) 119  89 (!) 114  Resp: (!) 29  (!) 22   Temp:    97.7 F (36.5 C)  TempSrc:    Oral  SpO2: 93%  95% 92%  Weight:  (!) 165.3 kg    Height:        Intake/Output Summary (Last 24 hours) at 06/26/2019 0848 Last data filed at 06/26/2019 0444 Gross per 24 hour  Intake 120 ml  Output 2920 ml  Net -2800 ml   Filed Weights   06/24/19 1800 06/25/19 0500 06/26/19 0453  Weight: (!) 165.6 kg (!) 175.1 kg (!) 165.3 kg    Examination:  General: 78 y.o. male resting in bed in NAD Eyes: PERRL, normal sclera ENMT: Nares patent w/o discharge, orophaynx clear, dentition normal, ears w/o discharge/lesions/ulcers Cardiovascular: tachy, +S1, S2, no m/g/r Respiratory: CTABL, no w/r/r, normal WOB GI: BS+, obese, NT, no masses noted, no organomegaly noted MSK: No c/c; BLE edema, chronic venous changes Neuro: A&O x 3, no focal deficits Psyc: Appropriate interaction and affect, calm/cooperative   Data Reviewed: I have personally reviewed following labs and imaging studies.  CBC: Recent Labs  Lab 06/24/19 1158 06/25/19 0816 06/26/19 0346  WBC 9.4 12.1* 12.7*  NEUTROABS 8.5*  --   --   HGB 13.0 10.9* 11.9*  HCT 39.6 32.0* 35.3*  MCV 95.0 93.8 92.7  PLT 149* 144* 163*   Basic Metabolic Panel: Recent Labs  Lab 06/24/19 1158 06/25/19 0357 06/26/19 0346  NA 141 138 142  K 3.1* 4.9 4.2  CL 110 111 107  CO2 17* 14* 24  GLUCOSE 122* 97 147*  BUN 28* 35* 39*  CREATININE 2.26* 1.77* 1.58*  CALCIUM 8.6* 7.4* 7.8*  MG  --  2.0 2.1   GFR: Estimated Creatinine Clearance: 61.2 mL/min (A) (by C-G formula based on SCr of 1.58 mg/dL (H)). Liver Function Tests: Recent Labs  Lab 06/24/19 1158  06/26/19 0346  AST 41 85*  ALT 19 30  ALKPHOS 59 56  BILITOT 2.0* 1.6*  PROT 6.7 6.2*  ALBUMIN 3.4* 2.8*   Recent Labs  Lab 06/24/19 1158  LIPASE 20   No results for input(s): AMMONIA in the last 168 hours. Coagulation Profile: Recent Labs  Lab 06/24/19 1158  INR 2.2*   Cardiac Enzymes: No results for input(s): CKTOTAL, CKMB, CKMBINDEX, TROPONINI in the last 168 hours. BNP (last 3 results) No results for input(s): PROBNP in the last 8760 hours. HbA1C: No results for input(s): HGBA1C in the last 72 hours. CBG: Recent Labs  Lab 06/25/19 2149  GLUCAP 157*   Lipid Profile: No results for input(s): CHOL, HDL, LDLCALC, TRIG, CHOLHDL, LDLDIRECT in the last 72 hours. Thyroid Function Tests: No results for input(s): TSH, T4TOTAL, FREET4, T3FREE, THYROIDAB in the last 72 hours. Anemia Panel: No results for input(s): VITAMINB12, FOLATE, FERRITIN, TIBC, IRON, RETICCTPCT in the last 72 hours. Sepsis Labs: Recent Labs  Lab 06/24/19 1400 06/24/19 1840 06/24/19 1841 06/24/19 2043 06/24/19 2306  PROCALCITON  --   --  57.08  --   --   LATICACIDVEN 3.0* 2.4*  --  2.5* 1.9    Recent Results (from the past 240 hour(s))  Culture, blood (routine x 2)     Status: None (Preliminary result)   Collection Time: 06/24/19 11:58 AM   Specimen: BLOOD RIGHT HAND  Result Value Ref Range Status   Specimen Description BLOOD RIGHT HAND  Final   Special Requests   Final    BOTTLES DRAWN AEROBIC AND ANAEROBIC Blood Culture adequate volume   Culture  Setup Time   Final    IN BOTH AEROBIC AND ANAEROBIC BOTTLES GRAM NEGATIVE RODS CRITICAL RESULT CALLED TO, READ BACK BY AND VERIFIED WITH: Salli Real [id number] 06/25/2019 Mena Goes Performed at [facility], [address] [place], [place] [address]    Culture GRAM NEGATIVE RODS  Final   Report Status PENDING  Incomplete  Blood Culture ID Panel (Reflexed)     Status: Abnormal   Collection Time: 06/24/19 11:58 AM  Result Value Ref Range  Status   Enterococcus species NOT DETECTED NOT DETECTED Final   Listeria monocytogenes NOT DETECTED NOT DETECTED Final   Staphylococcus species NOT DETECTED NOT DETECTED Final   Staphylococcus aureus (BCID) NOT DETECTED NOT DETECTED Final   Streptococcus species NOT DETECTED NOT DETECTED Final   Streptococcus agalactiae NOT DETECTED NOT DETECTED Final   Streptococcus pneumoniae NOT DETECTED NOT DETECTED Final   Streptococcus pyogenes NOT DETECTED NOT DETECTED Final   Acinetobacter baumannii NOT DETECTED NOT DETECTED Final   Enterobacteriaceae species DETECTED (A) NOT DETECTED Final    Comment: Enterobacteriaceae represent a large family of gram-negative bacteria, not a single organism. CRITICAL RESULT CALLED TO, READ BACK BY AND VERIFIED WITH: G. ABBOTT,PHARMD 1443 06/25/2019 T. TYSOR    Enterobacter cloacae complex NOT DETECTED NOT DETECTED Final   Escherichia coli NOT DETECTED NOT DETECTED Final   Klebsiella oxytoca NOT DETECTED NOT DETECTED Final   Klebsiella pneumoniae NOT DETECTED NOT DETECTED Final   Proteus species DETECTED (A) NOT DETECTED Final    Comment: CRITICAL RESULT CALLED TO, READ BACK BY AND VERIFIED WITH: G. ABBOTT,PHARMD 1540 06/25/2019 T. TYSOR    Serratia marcescens NOT DETECTED NOT DETECTED Final   Carbapenem resistance NOT DETECTED NOT DETECTED Final   Haemophilus influenzae NOT DETECTED NOT DETECTED Final   Neisseria meningitidis NOT DETECTED NOT DETECTED Final   Pseudomonas aeruginosa NOT DETECTED NOT DETECTED  Final   Candida albicans NOT DETECTED NOT DETECTED Final   Candida glabrata NOT DETECTED NOT DETECTED Final   Candida krusei NOT DETECTED NOT DETECTED Final   Candida parapsilosis NOT DETECTED NOT DETECTED Final   Candida tropicalis NOT DETECTED NOT DETECTED Final    Comment: Performed at [facility], [place] [address] [place], [place] [address]  SARS Coronavirus 2 [facility] order, Performed in [facility] hospital lab) Nasopharyngeal  Nasopharyngeal Swab     Status: None   Collection Time: 06/24/19 11:59 AM   Specimen: Nasopharyngeal Swab  Result Value Ref Range Status   SARS Coronavirus 2 NEGATIVE NEGATIVE Final    Comment: (NOTE) If result is NEGATIVE SARS-CoV-2 target nucleic acids are NOT DETECTED. The SARS-CoV-2 RNA is generally detectable in upper and lower  respiratory specimens during the acute phase of infection. The lowest  concentration of SARS-CoV-2 viral copies this assay can detect is 250  copies / mL. A negative result does not preclude SARS-CoV-2 infection  and should not be used as the sole basis for treatment or other  patient management decisions.  A negative result may occur with  improper specimen collection / handling, submission of specimen other  than nasopharyngeal swab, presence of viral mutation(s) within the  areas targeted by this assay, and inadequate number of viral copies  (<250 copies / mL). A negative result must be combined with clinical  observations, patient history, and epidemiological information. If result is POSITIVE SARS-CoV-2 target nucleic acids are DETECTED. The SARS-CoV-2 RNA is generally detectable in upper and lower  respiratory specimens dur ing the acute phase of infection.  Positive  results are indicative of active infection with SARS-CoV-2.  Clinical  correlation with patient history and other diagnostic information is  necessary to determine patient infection status.  Positive results do  not rule out bacterial infection or co-infection with other viruses. If result is PRESUMPTIVE POSTIVE SARS-CoV-2 nucleic acids MAY BE PRESENT.   A presumptive positive result was obtained on the submitted specimen  and confirmed on repeat testing.  While 2019 novel coronavirus  (SARS-CoV-2) nucleic acids may be present in the submitted sample  additional confirmatory testing may be necessary for epidemiological  and / or clinical management purposes  to differentiate between   SARS-CoV-2 and other Sarbecovirus currently known to infect humans.  If clinically indicated additional testing with an alternate test  methodology [id number]) is advised. The SARS-CoV-2 RNA is generally  detectable in upper and lower respiratory sp ecimens during the acute  phase of infection. The expected result is Negative. Fact Sheet for Patients:  StrictlyIdeas.no Fact Sheet for Healthcare Providers: BankingDealers.co.za This test is not yet approved or cleared by the [place] FDA and has been authorized for detection and/or diagnosis of SARS-CoV-2 by FDA under an Emergency Use Authorization (EUA).  This EUA will remain in effect (meaning this test can be used) for the duration of the COVID-19 declaration under Section 564(b)(1) of the Act, 21 U.S.C. section 360bbb-3(b)(1), unless the authorization is terminated or revoked sooner. Performed at [facility], [place] [address], [place], [place] [address]   Culture, blood (routine x 2)     Status: None (Preliminary result)   Collection Time: 06/24/19  2:10 PM   Specimen: BLOOD LEFT HAND  Result Value Ref Range Status   Specimen Description BLOOD LEFT HAND  Final   Special Requests   Final    BOTTLES DRAWN AEROBIC AND ANAEROBIC Blood Culture adequate volume  Culture  Setup Time   Final    GRAM NEGATIVE RODS IN BOTH AEROBIC AND ANAEROBIC BOTTLES CRITICAL RESULT CALLED TO, READ BACK BY AND VERIFIED WITH: PHARMD J LEDFORD [id number] AT 642 AM BY CM Performed at [facility], [place] [address], [place], [place] [address]    Culture GRAM NEGATIVE RODS  Final   Report Status PENDING  Incomplete  Urine culture     Status: Abnormal   Collection Time: 06/24/19  2:30 PM   Specimen: Urine, Catheterized  Result Value Ref Range Status   Specimen Description URINE, CATHETERIZED  Final   Special Requests   Final    NONE Performed at [facility], [place] [address] [place],  [place] [address]    Culture >=100,000 COLONIES/mL PROTEUS MIRABILIS (A)  Final   Report Status 06/26/2019 FINAL  Final   Organism ID, Bacteria PROTEUS MIRABILIS (A)  Final      Susceptibility   Proteus mirabilis - MIC*    AMPICILLIN <=2 SENSITIVE Sensitive     CEFAZOLIN <=4 SENSITIVE Sensitive     CEFTRIAXONE <=1 SENSITIVE Sensitive     CIPROFLOXACIN <=0.25 SENSITIVE Sensitive     GENTAMICIN <=1 SENSITIVE Sensitive     IMIPENEM 2 SENSITIVE Sensitive     NITROFURANTOIN 256 RESISTANT Resistant     TRIMETH/SULFA <=20 SENSITIVE Sensitive     AMPICILLIN/SULBACTAM <=2 SENSITIVE Sensitive     PIP/TAZO <=4 SENSITIVE Sensitive     * >=100,000 COLONIES/mL PROTEUS MIRABILIS  Aerobic/Anaerobic Culture (surgical/deep wound)     Status: None (Preliminary result)   Collection Time: 06/24/19  5:44 PM   Specimen: Abscess  Result Value Ref Range Status   Specimen Description ABSCESS LEFT PCN  Final   Special Requests NONE  Final   Gram Stain   Final    ABUNDANT WBC PRESENT,BOTH PMN AND MONONUCLEAR ABUNDANT GRAM VARIABLE ROD    Culture   Final    CULTURE REINCUBATED FOR BETTER GROWTH Performed at [facility], [address], [place], [place] [address]    Report Status PENDING  Incomplete  MRSA PCR Screening     Status: None   Collection Time: 06/25/19  5:13 AM   Specimen: Nasal Mucosa; Nasopharyngeal  Result Value Ref Range Status   MRSA by PCR NEGATIVE NEGATIVE Final    Comment:        The GeneXpert MRSA Assay (FDA approved for NASAL specimens only), is one component of a comprehensive MRSA colonization surveillance program. It is not intended to diagnose MRSA infection nor to guide or monitor treatment for MRSA infections. Performed at [facility], [place] [address] [place], [place] [address]       Radiology Studies: Ct Abdomen Pelvis Wo Contrast  Result Date: 06/24/2019 CLINICAL DATA:  78 year old male with recent history of abdominal pain. EXAM: CT CHEST, ABDOMEN AND  PELVIS WITHOUT CONTRAST TECHNIQUE: Multidetector CT imaging of the chest, abdomen and pelvis was performed following the standard protocol without IV contrast. COMPARISON:  No priors. FINDINGS: CT CHEST FINDINGS Cardiovascular: Heart size is mildly enlarged. There is no significant pericardial fluid, thickening or pericardial calcification. There is aortic atherosclerosis, as well as atherosclerosis of the great vessels of the mediastinum and the coronary arteries, including calcified atherosclerotic plaque in the left main, left anterior descending, left circumflex and right coronary arteries. Mediastinum/Nodes: No pathologically enlarged mediastinal or hilar lymph nodes. Please note that accurate exclusion of hilar adenopathy is limited on noncontrast CT scans. Esophagus is  unremarkable in appearance. No axillary lymphadenopathy. Lungs/Pleura: Areas of scarring or subsegmental atelectasis in the lower lobes of the lungs bilaterally. A few scattered small pulmonary nodules are noted in the lungs, largest of which is in the right middle lobe (axial image 84 of series 4) measuring 5 mm. No other larger more suspicious appearing pulmonary nodules or masses are noted. No acute consolidative airspace disease. No pleural effusions. Musculoskeletal: Large fatty attenuation lesion in the left chest wall extending into the left axillary region, incompletely imaged, but measuring at least 22.5 x 14.8 cm. There are no aggressive appearing lytic or blastic lesions noted in the visualized portions of the skeleton. CT ABDOMEN PELVIS FINDINGS Hepatobiliary: Diffuse low attenuation throughout the hepatic parenchyma, indicative of hepatic steatosis. No definite suspicious cystic or solid hepatic lesions are confidently identified on today's noncontrast CT examination. Intermediate attenuation lying dependently in the gallbladder, likely to represent tiny noncalcified gallstones. No findings to suggest an acute cholecystitis at this  time. Pancreas: No pancreatic mass. No pancreatic ductal dilatation. No pancreatic or peripancreatic fluid collections or inflammatory changes. Spleen: Unremarkable. Adrenals/Urinary Tract: Nonobstructive calculi in the lower pole collecting system of left kidney measuring up to 4 mm. In addition, there is a 1 cm calculus at the left ureteropelvic junction associated with mild-to-moderate proximal hydronephrosis and extensive perinephric stranding indicating obstruction at this time. No additional calculi are noted in the collecting system of the right kidney, along the course of the right ureter or within the lumen of the urinary bladder. No right hydroureteronephrosis. Unenhanced appearance of the kidneys is otherwise unremarkable. Urinary bladder is nearly decompressed, but otherwise unremarkable in appearance. Bilateral adrenal glands are normal in appearance. Stomach/Bowel: Unenhanced appearance of the stomach is normal. No pathologic dilatation of small bowel or colon. The appendix is not confidently identified and may be surgically absent. Regardless, there are no inflammatory changes noted adjacent to the cecum to suggest the presence of an acute appendicitis at this time. 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 No lymphadenopathy noted in the abdomen or pelvis. Reproductive: Prostate gland and seminal vesicles are unremarkable in appearance. Other: No significant volume of ascites.  No pneumoperitoneum. Musculoskeletal: There are no aggressive appearing lytic or blastic lesions noted in the visualized portions of the skeleton. IMPRESSION: 1. 1 cm calculus at the left ureteropelvic junction with mild to moderate proximal left hydronephrosis and extensive perinephric stranding indicative of  obstruction. 2. No acute findings noted in the thorax. 3. 2 additional nonobstructive calculi in the lower pole collecting system of left kidney measuring up to 4 mm. 4. Aortic atherosclerosis, in addition to left main and 3 vessel coronary artery disease. Assessment for potential risk factor modification, dietary therapy or pharmacologic therapy may be warranted, if clinically indicated. 5. Probable cholelithiasis without evidence of acute cholecystitis at this time. 6. Mild cardiomegaly. 7. 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 Electronically Signed   By: Vinnie Langton M.D.   On: 06/24/2019 15:16   Dg Abd 1 View  Result Date: 06/26/2019 CLINICAL DATA:  Hematemesis. EXAM: ABDOMEN - 1 VIEW COMPARISON:  CT 06/24/2019 FINDINGS: Upper abdomen, right lateral abdomen, left lateral abdomen are not included in the field of view. Prominent gaseous distention of stomach is partially included. Air-filled prominent colon. No small  bowel obstruction. Left for ostomy tube is partially included. Bilateral iliac venous stents. IMPRESSION: 1. Prominent gaseous distention of the stomach, partially included in the field of view. 2. Prominent air-filled colon. No small bowel obstruction. Electronically Signed   By: Keith Rake M.D.   On: 06/26/2019 03:45   Ct Chest Wo Contrast  Result Date: 06/24/2019 CLINICAL DATA:  78 year old male with recent history of abdominal pain. EXAM: CT CHEST, ABDOMEN AND PELVIS WITHOUT CONTRAST TECHNIQUE: Multidetector CT imaging of the chest, abdomen and pelvis was performed following the standard protocol without IV contrast. COMPARISON:  No priors. FINDINGS: CT CHEST FINDINGS Cardiovascular: Heart size is mildly enlarged. There is no significant pericardial fluid, thickening or pericardial calcification. There is aortic atherosclerosis, as well as atherosclerosis of the great vessels of the mediastinum and the coronary arteries, including  calcified atherosclerotic plaque in the left main, left anterior descending, left circumflex and right coronary arteries. Mediastinum/Nodes: No pathologically enlarged mediastinal or hilar lymph nodes. Please note that accurate exclusion of hilar adenopathy is limited on noncontrast CT scans. Esophagus is unremarkable in appearance. No axillary lymphadenopathy. Lungs/Pleura: Areas of scarring or subsegmental atelectasis in the lower lobes of the lungs bilaterally. A few scattered small pulmonary nodules are noted in the lungs, largest of which is in the right middle lobe (axial image 84 of series 4) measuring 5 mm. No other larger more suspicious appearing pulmonary nodules or masses are noted. No acute consolidative airspace disease. No pleural effusions. Musculoskeletal: Large fatty attenuation lesion in the left chest wall extending into the left axillary region, incompletely imaged, but measuring at least 22.5 x 14.8 cm. There are no aggressive appearing lytic or blastic lesions noted in the visualized portions of the skeleton. CT ABDOMEN PELVIS FINDINGS Hepatobiliary: Diffuse low attenuation throughout the hepatic parenchyma, indicative of hepatic steatosis. No definite suspicious cystic or solid hepatic lesions are confidently identified on today's noncontrast CT examination. Intermediate attenuation lying dependently in the gallbladder, likely to represent tiny noncalcified gallstones. No findings to suggest an acute cholecystitis at this time. Pancreas: No pancreatic mass. No pancreatic ductal dilatation. No pancreatic or peripancreatic fluid collections or inflammatory changes. Spleen: Unremarkable. Adrenals/Urinary Tract: Nonobstructive calculi in the lower pole collecting system of left kidney measuring up to 4 mm. In addition, there is a 1 cm calculus at the left ureteropelvic junction associated with mild-to-moderate proximal hydronephrosis and extensive perinephric stranding indicating obstruction at  this time. No additional calculi are noted in the collecting system of the right kidney, along the course of the right ureter or within the lumen of the urinary bladder. No right hydroureteronephrosis. Unenhanced appearance of the kidneys is otherwise unremarkable. Urinary bladder is nearly decompressed, but otherwise unremarkable in appearance. Bilateral adrenal glands are normal in appearance. Stomach/Bowel: Unenhanced appearance of the stomach is normal. No pathologic dilatation of small bowel or colon. The appendix is not confidently identified and may be surgically absent. Regardless, there are no inflammatory changes noted adjacent to the cecum to suggest the presence of an acute appendicitis at this time. 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 No lymphadenopathy noted in the abdomen or pelvis. Reproductive: Prostate gland and seminal vesicles are unremarkable in appearance. Other: No significant volume of ascites.  No pneumoperitoneum. Musculoskeletal: There are no aggressive appearing lytic or blastic lesions noted in  the visualized portions of the skeleton. IMPRESSION: 1. 1 cm calculus at the left ureteropelvic junction with mild to moderate proximal left hydronephrosis and extensive perinephric stranding indicative of obstruction. 2. No acute findings noted in the thorax. 3. 2 additional nonobstructive calculi in the lower pole collecting system of left kidney measuring up to 4 mm. 4. Aortic atherosclerosis, in addition to left main and 3 vessel coronary artery disease. Assessment for potential risk factor modification, dietary therapy or pharmacologic therapy may be warranted, if clinically indicated. 5. Probable cholelithiasis without evidence of acute cholecystitis at this time. 6. Mild  cardiomegaly. 7. 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 Electronically Signed   By: Vinnie Langton M.D.   On: 06/24/2019 15:16   Dg Chest Port 1 View  Result Date: 06/25/2019 CLINICAL DATA:  Hypoxia EXAM: PORTABLE CHEST 1 VIEW COMPARISON:  06/24/2019 FINDINGS: Cardiac shadow remains enlarged but accentuated by the portable technique. The lungs are well aerated bilaterally. No focal infiltrate or sizable effusion is noted. Minimal basilar atelectatic changes are seen. IMPRESSION: Mild basilar atelectasis similar to that seen on prior CT examination. Electronically Signed   By: Inez Catalina M.D.   On: 06/25/2019 09:42   Dg Chest Portable 1 View  Result Date: 06/24/2019 CLINICAL DATA:  Shortness of breath. EXAM: PORTABLE CHEST 1 VIEW COMPARISON:  None. FINDINGS: Lung volumes are low but the lungs are clear. Heart size is upper normal. No pneumothorax or pleural fluid. No acute or focal bony abnormality. IMPRESSION: No acute disease. Electronically Signed   By: Inge Rise M.D.   On: 06/24/2019 12:22   Dg Abd Portable 1v  Result Date: 06/26/2019 CLINICAL DATA:  NG tube placement EXAM: PORTABLE ABDOMEN - 1 VIEW COMPARISON:  06/26/2019 at 0337 hours FINDINGS: Single abdominal radiograph centered at the hemidiaphragms. Enteric tube looped in the distal esophagus. Gastric distension. Pigtail drain/nephrostomy catheter overlying the left mid abdomen, incompletely visualized. Lung bases are clear. IMPRESSION: Enteric tube looped in the distal esophagus. Advancement/adjustment is suggested. These results will be called to the ordering clinician or representative by the Radiologist Assistant, and communication documented in the PACS or zVision Dashboard. Electronically Signed   By: Julian Hy M.D.   On: 06/26/2019 07:47   Ir Nephrostomy Placement Left  Result Date: 06/25/2019 INDICATION: 78 year old male with a history of urosepsis structure ureteral stone  EXAM: IMAGE GUIDED PERCUTANEOUS NEPHROSTOMY COMPARISON:  None. MEDICATIONS: Ciprofloxacin 400 mg IV; The antibiotic was administered in an appropriate time frame prior to skin puncture. ANESTHESIA/SEDATION: None The patient was continuously monitored during the procedure by the interventional radiology nurse under my direct supervision. CONTRAST:  15 cc-administered into the collecting system(s) FLUOROSCOPY TIME:  Fluoroscopy Time: 0 minutes 42 seconds (16.9 mGy). COMPLICATIONS: None PROCEDURE: Informed written consent was obtained from the patient after a thorough discussion of the procedural risks, benefits and alternatives. All questions were addressed. Maximal Sterile Barrier Technique was utilized including caps, mask, sterile gowns, sterile gloves, sterile drape, hand hygiene and skin antiseptic. A timeout was performed prior to the initiation of the procedure. Patient positioned prone position on the fluoroscopy table. Ultrasound survey of the left flank was performed with images stored and sent to PACs. The patient was then prepped and draped in the usual sterile fashion. 1% lidocaine was used to anesthetize the skin and subcutaneous tissues for local anesthesia. A Chiba needle was then used to access a posterior inferior calyx with ultrasound guidance. With spontaneous urine returned through  the needle, passage of an 018 micro wire into the collecting system was performed under fluoroscopy. A small incision was made with an 11 blade scalpel, and the needle was removed from the wire. An Accustick system was then advanced over the wire into the collecting system under fluoroscopy. The metal stiffener and inner dilator were removed, and then a sample of fluid was aspirated through the 4 French outer sheath. Bentson wire was passed into the collecting system and the sheath removed. Ten French dilation of the soft tissues was performed. Using modified Seldinger technique, a 10 French pigtail catheter drain was  placed over the Bentson wire. Wire and inner stiffener removed, and the pigtail was formed in the collecting system. Sample was sent for culture. Small amount of contrast confirmed position of the catheter. Patient tolerated the procedure well and remained hemodynamically stable throughout. No complications were encountered and no significant blood loss encountered IMPRESSION: Status post image guided left-sided percutaneous nephrostomy. Signed, Dulcy Fanny. Dellia Nims, RPVI [facility] Electronically Signed   By: Corrie Mckusick D.O.   On: 06/25/2019 08:15     Scheduled Meds:  atorvastatin  10 mg Oral Daily   Chlorhexidine Gluconate Cloth  6 each Topical Daily   levalbuterol  0.63 mg Nebulization QID   metoprolol succinate  12.5 mg Oral Daily   sodium bicarbonate  650 mg Oral TID   sodium chloride flush  3 mL Intravenous Q12H   Continuous Infusions:  cefTRIAXone (ROCEPHIN)  IV 2 g (06/25/19 0830)     LOS: 2 days    Time spent: 35 minutes spent in the coordination of care today with half the time spent counseling patient on his respiratory status and possible ileus.   Jonnie Finner, DO [facility] Pager [phone]  If 7PM-7AM, please contact night-coverage [email or web address] Password [email or web address] 06/26/2019, 8:48 AM

## 2019-06-26 NOTE — Progress Notes (Signed)
Night shift made 2 attempts to place NG tube without success.  Writer attempted and tube curled up into back of patient's mouth.  Patient stated just take it out.  Removed per patient request

## 2019-06-26 NOTE — Progress Notes (Signed)
Patient sleep sound.  Reported earier that he has slept in days

## 2019-06-26 NOTE — Progress Notes (Signed)
Patient refused CPAP for tonight due to nausea and vomiting. Patient wants to try to wear CPAP tomorrow night if he is feeling better. RT will monitor as needed.

## 2019-06-26 NOTE — Progress Notes (Signed)
Patient continues to sleep.

## 2019-06-26 NOTE — Progress Notes (Signed)
Shift event: Pt has vomited 3 times since shift change. Compazine given in addition to Zofran, then Zofran changed to Compazine due to Zofran being inadequate. KUB ordered and showed gasseous distention of the stomach and air filled colon. Asked RN to page back if n/v recurred. RN paged back that pt had vomited again. NGT ordered for suspected ileus. No signs of bowel obstruction on KUB. He had a CT abd and pelvis 2 days ago on admission which showed the obstructing ureteral stone and cholelithiasis without evidence of cholecystitis. His WBCC is not higher tonight. His Hgb is up so NP does not think this is hematemesis. Pt NPO except chips. Note left for oncoming attending.  KJKG, NP [facility]

## 2019-06-26 NOTE — Progress Notes (Signed)
Patient with very large watery nearly black /brown stool running off the bed and continuing uncontrollably.  30 cc sample obtained and hemoccult card swabbed.  Call to Dr  Marylyn Ishihara to update and get orders

## 2019-06-26 NOTE — Progress Notes (Signed)
Patient with episode of emesis, brown in nature.  Phenergan 12.5 mg in 10 ccs of NS

## 2019-06-26 NOTE — Progress Notes (Addendum)
Patient with soiled bedding and gown.  Requested to sleep and not be disturbed at the moment to change linens and gown.  Door closed, lights off

## 2019-06-26 NOTE — Progress Notes (Signed)
Cardiology Progress Note  Patient ID: Graysyn Bache MRN: [id number] DOB: [birth year] Date of Encounter: 06/26/2019  Primary Cardiologist: Glenetta Hew, MD  Subjective  Atrial fibrillation is well controlled with heart rate in the 70-80 range.  He reports he is still nauseated and having significant vomiting overnight.  He reports no chest pain.  He does report increased work of breathing.  He was given 40 mg of IV Lasix with nearly 3 L of urine output.  Urology is okay to restart Xarelto tonight.  ROS:  All other ROS reviewed and negative. Pertinent positives noted in the HPI.     Inpatient Medications  Scheduled Meds:  atorvastatin  10 mg Oral Daily   bisacodyl  10 mg Rectal Daily   Chlorhexidine Gluconate Cloth  6 each Topical Daily   furosemide  20 mg Oral BID   levalbuterol  0.63 mg Nebulization QID   metoprolol succinate  25 mg Oral Daily   rivaroxaban  20 mg Oral Q supper   sodium bicarbonate  650 mg Oral TID   sodium chloride flush  3 mL Intravenous Q12H   Continuous Infusions:  cefTRIAXone (ROCEPHIN)  IV 2 g (06/25/19 0830)   PRN Meds: acetaminophen **OR** acetaminophen, levalbuterol, polyethylene glycol, prochlorperazine, promethazine **OR** promethazine   Vital Signs   Vitals:   06/26/19 0326 06/26/19 0453 06/26/19 0724 06/26/19 0814  BP: (!) 142/80   118/68  Pulse: (!) 119  89 (!) 114  Resp: (!) 29  (!) 22   Temp:    97.7 F (36.5 C)  TempSrc:    Oral  SpO2: 93%  95% 92%  Weight:  (!) 165.3 kg    Height:        Intake/Output Summary (Last 24 hours) at 06/26/2019 0920 Last data filed at 06/26/2019 0444 Gross per 24 hour  Intake 120 ml  Output 2920 ml  Net -2800 ml   Last 3 Weights 06/26/2019 06/25/2019 06/24/2019  Weight (lbs) 364 lb 6.7 oz 386 lb 0.4 oz 365 lb  Weight (kg) 165.3 kg 175.1 kg 165.563 kg      Telemetry  Overnight telemetry shows atrial fibrillation with heart rate in the 70-90 range, which I personally reviewed.   Physical  Exam   Vitals:   06/26/19 0326 06/26/19 0453 06/26/19 0724 06/26/19 0814  BP: (!) 142/80   118/68  Pulse: (!) 119  89 (!) 114  Resp: (!) 29  (!) 22   Temp:    97.7 F (36.5 C)  TempSrc:    Oral  SpO2: 93%  95% 92%  Weight:  (!) 165.3 kg    Height:         Intake/Output Summary (Last 24 hours) at 06/26/2019 0920 Last data filed at 06/26/2019 0444 Gross per 24 hour  Intake 120 ml  Output 2920 ml  Net -2800 ml    Last 3 Weights 06/26/2019 06/25/2019 06/24/2019  Weight (lbs) 364 lb 6.7 oz 386 lb 0.4 oz 365 lb  Weight (kg) 165.3 kg 175.1 kg 165.563 kg    Body mass index is 48.08 kg/m.  General: Obese male with tachypnea Head: Atraumatic, normal size  Eyes: PEERLA, EOMI  Neck: Neck adiposity precludes JVD assessment Endocrine: No thryomegaly Cardiac: Irregular rhythm no murmurs rubs or gallops Lungs: Diminished breath sounds bilaterally Abd: Soft, nontender, no hepatomegaly  Ext: Chronic venous insufficiency changes noted, 1+ lower extreme edema Musculoskeletal: No deformities, BUE and BLE strength normal and equal Skin: Venous insufficiency changes noted lower extremities Neuro: Alert and  oriented to person, place, time, and situation, CNII-XII grossly intact, no focal deficits  Psych: Normal mood and affect   Labs  High Sensitivity Troponin:   Recent Labs  Lab 06/24/19 1158 06/24/19 1400  TROPONINIHS 371* 496*     Cardiac EnzymesNo results for input(s): TROPONINI in the last 168 hours. No results for input(s): TROPIPOC in the last 168 hours.  Chemistry Recent Labs  Lab 06/24/19 1158 06/25/19 0357 06/26/19 0346  NA 141 138 142  K 3.1* 4.9 4.2  CL 110 111 107  CO2 17* 14* 24  GLUCOSE 122* 97 147*  BUN 28* 35* 39*  CREATININE 2.26* 1.77* 1.58*  CALCIUM 8.6* 7.4* 7.8*  PROT 6.7  --  6.2*  ALBUMIN 3.4*  --  2.8*  AST 41  --  85*  ALT 19  --  30  ALKPHOS 59  --  56  BILITOT 2.0*  --  1.6*  GFRNONAA 27* 36* 41*  GFRAA 31* 41* 48*  ANIONGAP 14 13 11       Hematology Recent Labs  Lab 06/24/19 1158 06/25/19 0816 06/26/19 0346  WBC 9.4 12.1* 12.7*  RBC 4.17* 3.41* 3.81*  HGB 13.0 10.9* 11.9*  HCT 39.6 32.0* 35.3*  MCV 95.0 93.8 92.7  MCH 31.2 32.0 31.2  MCHC 32.8 34.1 33.7  RDW 13.9 14.6 14.6  PLT 149* 144* 140*   BNPNo results for input(s): BNP, PROBNP in the last 168 hours.  DDimer No results for input(s): DDIMER in the last 168 hours.   Radiology  Ct Abdomen Pelvis Wo Contrast  Result Date: 06/24/2019 CLINICAL DATA:  78 year old male with recent history of abdominal pain. EXAM: CT CHEST, ABDOMEN AND PELVIS WITHOUT CONTRAST TECHNIQUE: Multidetector CT imaging of the chest, abdomen and pelvis was performed following the standard protocol without IV contrast. COMPARISON:  No priors. FINDINGS: CT CHEST FINDINGS Cardiovascular: Heart size is mildly enlarged. There is no significant pericardial fluid, thickening or pericardial calcification. There is aortic atherosclerosis, as well as atherosclerosis of the great vessels of the mediastinum and the coronary arteries, including calcified atherosclerotic plaque in the left main, left anterior descending, left circumflex and right coronary arteries. Mediastinum/Nodes: No pathologically enlarged mediastinal or hilar lymph nodes. Please note that accurate exclusion of hilar adenopathy is limited on noncontrast CT scans. Esophagus is unremarkable in appearance. No axillary lymphadenopathy. Lungs/Pleura: Areas of scarring or subsegmental atelectasis in the lower lobes of the lungs bilaterally. A few scattered small pulmonary nodules are noted in the lungs, largest of which is in the right middle lobe (axial image 84 of series 4) measuring 5 mm. No other larger more suspicious appearing pulmonary nodules or masses are noted. No acute consolidative airspace disease. No pleural effusions. Musculoskeletal: Large fatty attenuation lesion in the left chest wall extending into the left axillary region,  incompletely imaged, but measuring at least 22.5 x 14.8 cm. There are no aggressive appearing lytic or blastic lesions noted in the visualized portions of the skeleton. CT ABDOMEN PELVIS FINDINGS Hepatobiliary: Diffuse low attenuation throughout the hepatic parenchyma, indicative of hepatic steatosis. No definite suspicious cystic or solid hepatic lesions are confidently identified on today's noncontrast CT examination. Intermediate attenuation lying dependently in the gallbladder, likely to represent tiny noncalcified gallstones. No findings to suggest an acute cholecystitis at this time. Pancreas: No pancreatic mass. No pancreatic ductal dilatation. No pancreatic or peripancreatic fluid collections or inflammatory changes. Spleen: Unremarkable. Adrenals/Urinary Tract: Nonobstructive calculi in the lower pole collecting system of left kidney measuring up to  4 mm. In addition, there is a 1 cm calculus at the left ureteropelvic junction associated with mild-to-moderate proximal hydronephrosis and extensive perinephric stranding indicating obstruction at this time. No additional calculi are noted in the collecting system of the right kidney, along the course of the right ureter or within the lumen of the urinary bladder. No right hydroureteronephrosis. Unenhanced appearance of the kidneys is otherwise unremarkable. Urinary bladder is nearly decompressed, but otherwise unremarkable in appearance. Bilateral adrenal glands are normal in appearance. Stomach/Bowel: Unenhanced appearance of the stomach is normal. No pathologic dilatation of small bowel or colon. The appendix is not confidently identified and may be surgically absent. Regardless, there are no inflammatory changes noted adjacent to the cecum to suggest the presence of an acute appendicitis at this time. 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 No lymphadenopathy noted in the abdomen or pelvis. Reproductive: Prostate gland and seminal vesicles are unremarkable in appearance. Other: No significant volume of ascites.  No pneumoperitoneum. Musculoskeletal: There are no aggressive appearing lytic or blastic lesions noted in the visualized portions of the skeleton. IMPRESSION: 1. 1 cm calculus at the left ureteropelvic junction with mild to moderate proximal left hydronephrosis and extensive perinephric stranding indicative of obstruction. 2. No acute findings noted in the thorax. 3. 2 additional nonobstructive calculi in the lower pole collecting system of left kidney measuring up to 4 mm. 4. Aortic atherosclerosis, in addition to left main and 3 vessel coronary artery disease. Assessment for potential risk factor modification, dietary therapy or pharmacologic therapy may be warranted, if clinically indicated. 5. Probable cholelithiasis without evidence of acute cholecystitis at this time. 6. Mild cardiomegaly. 7. 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 Electronically Signed   By: Vinnie Langton M.D.   On: 06/24/2019 15:16   Dg Abd 1 View  Result Date: 06/26/2019 CLINICAL DATA:  Hematemesis. EXAM: ABDOMEN - 1 VIEW COMPARISON:  CT 06/24/2019 FINDINGS: Upper abdomen, right lateral abdomen, left lateral abdomen are not included in the field of view. Prominent gaseous distention of stomach is partially included. Air-filled prominent colon. No small bowel obstruction. Left for ostomy tube is partially included. Bilateral iliac venous stents. IMPRESSION: 1. Prominent gaseous distention of the stomach, partially included in the field of view. 2. Prominent air-filled colon. No small bowel obstruction. Electronically Signed   By: Keith Rake M.D.   On: 06/26/2019 03:45   Ct Chest Wo Contrast  Result Date:  06/24/2019 CLINICAL DATA:  78 year old male with recent history of abdominal pain. EXAM: CT CHEST, ABDOMEN AND PELVIS WITHOUT CONTRAST TECHNIQUE: Multidetector CT imaging of the chest, abdomen and pelvis was performed following the standard protocol without IV contrast. COMPARISON:  No priors. FINDINGS: CT CHEST FINDINGS Cardiovascular: Heart size is mildly enlarged. There is no significant pericardial fluid, thickening or pericardial calcification. There is aortic atherosclerosis, as well as atherosclerosis of the great vessels of the mediastinum and the coronary arteries, including calcified atherosclerotic plaque in the left main, left anterior descending, left circumflex and right coronary arteries. Mediastinum/Nodes: No pathologically enlarged mediastinal or hilar lymph nodes. Please note that accurate exclusion of hilar adenopathy is limited on noncontrast CT scans. Esophagus is unremarkable in appearance. No axillary lymphadenopathy. Lungs/Pleura: Areas of scarring or subsegmental atelectasis in the lower lobes of the lungs  bilaterally. A few scattered small pulmonary nodules are noted in the lungs, largest of which is in the right middle lobe (axial image 84 of series 4) measuring 5 mm. No other larger more suspicious appearing pulmonary nodules or masses are noted. No acute consolidative airspace disease. No pleural effusions. Musculoskeletal: Large fatty attenuation lesion in the left chest wall extending into the left axillary region, incompletely imaged, but measuring at least 22.5 x 14.8 cm. There are no aggressive appearing lytic or blastic lesions noted in the visualized portions of the skeleton. CT ABDOMEN PELVIS FINDINGS Hepatobiliary: Diffuse low attenuation throughout the hepatic parenchyma, indicative of hepatic steatosis. No definite suspicious cystic or solid hepatic lesions are confidently identified on today's noncontrast CT examination. Intermediate attenuation lying dependently in the  gallbladder, likely to represent tiny noncalcified gallstones. No findings to suggest an acute cholecystitis at this time. Pancreas: No pancreatic mass. No pancreatic ductal dilatation. No pancreatic or peripancreatic fluid collections or inflammatory changes. Spleen: Unremarkable. Adrenals/Urinary Tract: Nonobstructive calculi in the lower pole collecting system of left kidney measuring up to 4 mm. In addition, there is a 1 cm calculus at the left ureteropelvic junction associated with mild-to-moderate proximal hydronephrosis and extensive perinephric stranding indicating obstruction at this time. No additional calculi are noted in the collecting system of the right kidney, along the course of the right ureter or within the lumen of the urinary bladder. No right hydroureteronephrosis. Unenhanced appearance of the kidneys is otherwise unremarkable. Urinary bladder is nearly decompressed, but otherwise unremarkable in appearance. Bilateral adrenal glands are normal in appearance. Stomach/Bowel: Unenhanced appearance of the stomach is normal. No pathologic dilatation of small bowel or colon. The appendix is not confidently identified and may be surgically absent. Regardless, there are no inflammatory changes noted adjacent to the cecum to suggest the presence of an acute appendicitis at this time. 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 No lymphadenopathy noted in the abdomen or pelvis. Reproductive: Prostate gland and seminal vesicles are unremarkable in appearance. Other: No significant volume of ascites.  No pneumoperitoneum. Musculoskeletal: There are no aggressive appearing lytic or blastic lesions noted in the visualized portions of the skeleton. IMPRESSION: 1. 1 cm calculus at the left  ureteropelvic junction with mild to moderate proximal left hydronephrosis and extensive perinephric stranding indicative of obstruction. 2. No acute findings noted in the thorax. 3. 2 additional nonobstructive calculi in the lower pole collecting system of left kidney measuring up to 4 mm. 4. Aortic atherosclerosis, in addition to left main and 3 vessel coronary artery disease. Assessment for potential risk factor modification, dietary therapy or pharmacologic therapy may be warranted, if clinically indicated. 5. Probable cholelithiasis without evidence of acute cholecystitis at this time. 6. Mild cardiomegaly. 7. 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 Electronically Signed   By: Vinnie Langton M.D.   On: 06/24/2019 15:16   Dg Chest Port 1 View  Result Date: 06/25/2019 CLINICAL DATA:  Hypoxia EXAM: PORTABLE CHEST 1 VIEW COMPARISON:  06/24/2019 FINDINGS: Cardiac shadow remains enlarged but accentuated by the portable technique. The lungs are well aerated bilaterally. No focal infiltrate or sizable effusion is noted. Minimal basilar atelectatic changes are seen. IMPRESSION: Mild basilar atelectasis similar to that seen on prior CT examination. Electronically Signed   By: Elta Guadeloupe  Lukens M.D.   On: 06/25/2019 09:42   Dg Chest Portable 1 View  Result Date: 06/24/2019 CLINICAL DATA:  Shortness of breath. EXAM: PORTABLE CHEST 1 VIEW COMPARISON:  None. FINDINGS: Lung volumes are low but the lungs are clear. Heart size is upper normal. No pneumothorax or pleural fluid. No acute or focal bony abnormality. IMPRESSION: No acute disease. Electronically Signed   By: Inge Rise M.D.   On: 06/24/2019 12:22   Dg Abd Portable 1v  Result Date: 06/26/2019 CLINICAL DATA:  NG tube placement EXAM: PORTABLE ABDOMEN - 1 VIEW COMPARISON:  06/26/2019 at 0337 hours FINDINGS: Single abdominal radiograph centered at the hemidiaphragms. Enteric tube looped in the distal esophagus.  Gastric distension. Pigtail drain/nephrostomy catheter overlying the left mid abdomen, incompletely visualized. Lung bases are clear. IMPRESSION: Enteric tube looped in the distal esophagus. Advancement/adjustment is suggested. These results will be called to the ordering clinician or representative by the Radiologist Assistant, and communication documented in the PACS or zVision Dashboard. Electronically Signed   By: Julian Hy M.D.   On: 06/26/2019 07:47   Ir Nephrostomy Placement Left  Result Date: 06/25/2019 INDICATION: 78 year old male with a history of urosepsis structure ureteral stone EXAM: IMAGE GUIDED PERCUTANEOUS NEPHROSTOMY COMPARISON:  None. MEDICATIONS: Ciprofloxacin 400 mg IV; The antibiotic was administered in an appropriate time frame prior to skin puncture. ANESTHESIA/SEDATION: None The patient was continuously monitored during the procedure by the interventional radiology nurse under my direct supervision. CONTRAST:  15 cc-administered into the collecting system(s) FLUOROSCOPY TIME:  Fluoroscopy Time: 0 minutes 42 seconds (16.9 mGy). COMPLICATIONS: None PROCEDURE: Informed written consent was obtained from the patient after a thorough discussion of the procedural risks, benefits and alternatives. All questions were addressed. Maximal Sterile Barrier Technique was utilized including caps, mask, sterile gowns, sterile gloves, sterile drape, hand hygiene and skin antiseptic. A timeout was performed prior to the initiation of the procedure. Patient positioned prone position on the fluoroscopy table. Ultrasound survey of the left flank was performed with images stored and sent to PACs. The patient was then prepped and draped in the usual sterile fashion. 1% lidocaine was used to anesthetize the skin and subcutaneous tissues for local anesthesia. A Chiba needle was then used to access a posterior inferior calyx with ultrasound guidance. With spontaneous urine returned through the needle,  passage of an 018 micro wire into the collecting system was performed under fluoroscopy. A small incision was made with an 11 blade scalpel, and the needle was removed from the wire. An Accustick system was then advanced over the wire into the collecting system under fluoroscopy. The metal stiffener and inner dilator were removed, and then a sample of fluid was aspirated through the 4 French outer sheath. Bentson wire was passed into the collecting system and the sheath removed. Ten French dilation of the soft tissues was performed. Using modified Seldinger technique, a 10 French pigtail catheter drain was placed over the Bentson wire. Wire and inner stiffener removed, and the pigtail was formed in the collecting system. Sample was sent for culture. Small amount of contrast confirmed position of the catheter. Patient tolerated the procedure well and remained hemodynamically stable throughout. No complications were encountered and no significant blood loss encountered IMPRESSION: Status post image guided left-sided percutaneous nephrostomy. Signed, Dulcy Fanny. Dellia Nims, RPVI [facility] Radiology Electronically Signed   By: Corrie Mckusick D.O.   On: 06/25/2019 08:15    Cardiac Studies  TTE 9/29  1. Left ventricular  ejection fraction, by visual estimation, is 60 to 65%. The left ventricle has normal function. Normal left ventricular size. There is mildly increased left ventricular hypertrophy.  2. Definity contrast agent was given IV to delineate the left ventricular endocardial borders.  3. Global right ventricle has severely reduced systolic function.The right ventricular size is mildly enlarged.  4. Left atrial size was normal.  5. Right atrial size was mildly dilated.  6. The mitral valve is normal in structure. Trace mitral valve regurgitation. No evidence of mitral stenosis.  7. The tricuspid valve is normal in structure. Tricuspid valve regurgitation is  trivial.  8. The aortic valve is tricuspid Aortic valve regurgitation was not visualized by color flow Doppler. Structurally normal aortic valve, with no evidence of sclerosis or stenosis.  9. The pulmonic valve was not well visualized. Pulmonic valve regurgitation is not visualized by color flow Doppler. 10. Mildly elevated pulmonary artery systolic pressure. 11. The inferior vena cava is dilated in size with <50% respiratory variability, suggesting right atrial pressure of 15 mmHg. 12. Definity used; normal LV function; mild LVH; right heart not well visualized but right atrium and ventricle appear to be dilated with significant RV dysfunction.  Patient Profile  Tiburcio Linder is a 78 y.o. male with history of morbid obesity, chronic venous insufficiency status post lower extremity venous stenting, nonobstructive CAD, atrial fibrillation anticoagulation who was admitted for septic shock on 9/28.  Course complicated by his fibrillation RVR.  Assessment & Plan  1.  Non-myocardial infarction troponin elevation - no symptoms of ACS, no acute EKG changes, normal echocardiogram -This does not represent ACS no further work-up is needed  2.  Atrial fibrillation on anticoagulation -Increase metoprolol to home dose of 25 mg succinate daily -Xarelto will start tonight as urology is okay with this  3.  Volume overload -Good IV diuresis -Primary team with oral p.o. Lasix  [facility] will sign off.   Medication Recommendations: Metoprolol succinate 25 mg daily, Xarelto 20 mg daily Other recommendations (labs, testing, etc): None Follow up as an outpatient: We will arrange follow-up with Dr. Ellyn Hack in 2 to 3 months post discharge  For questions or updates, please contact [facility] Please consult [email or web address] for contact info under   Signed, [place] Bells T. Audie Box, [place]  06/26/2019 9:20 AM

## 2019-06-26 NOTE — Progress Notes (Signed)
2 Days Post-Op Subjective: NAEO.  Continues to have nausea with vomiting.  Denies flank pain.    Objective: Vital signs in last 24 hours: Temp:  [97.7 F (36.5 C)-99.1 F (37.3 C)] 97.7 F (36.5 C) (09/30 0814) Pulse Rate:  [89-126] 114 (09/30 0814) Resp:  [22-33] 22 (09/30 0724) BP: (111-142)/(53-86) 118/68 (09/30 0814) SpO2:  [90 %-95 %] 92 % (09/30 0814) Weight:  [165.3 kg] 165.3 kg (09/30 0453)  Intake/Output from previous day: 09/29 0701 - 09/30 0700 In: 360 [P.O.:360] Out: 3220 [Urine:2925; Emesis/NG output:295]  Intake/Output this shift: No intake/output data recorded.  Physical Exam:  General: Alert and oriented Gu: Foley and left PCN draining clear-amber urine Ext: NT, No erythema  Lab Results: Recent Labs    06/24/19 1158 06/25/19 0816 06/26/19 0346  HGB 13.0 10.9* 11.9*  HCT 39.6 32.0* 35.3*   BMET Recent Labs    06/25/19 0357 06/26/19 0346  NA 138 142  K 4.9 4.2  CL 111 107  CO2 14* 24  GLUCOSE 97 147*  BUN 35* 39*  CREATININE 1.77* 1.58*  CALCIUM 7.4* 7.8*     Studies/Results: Ct Abdomen Pelvis Wo Contrast  Result Date: 06/24/2019 CLINICAL DATA:  78 year old male with recent history of abdominal pain. EXAM: CT CHEST, ABDOMEN AND PELVIS WITHOUT CONTRAST TECHNIQUE: Multidetector CT imaging of the chest, abdomen and pelvis was performed following the standard protocol without IV contrast. COMPARISON:  No priors. FINDINGS: CT CHEST FINDINGS Cardiovascular: Heart size is mildly enlarged. There is no significant pericardial fluid, thickening or pericardial calcification. There is aortic atherosclerosis, as well as atherosclerosis of the great vessels of the mediastinum and the coronary arteries, including calcified atherosclerotic plaque in the left main, left anterior descending, left circumflex and right coronary arteries. Mediastinum/Nodes: No pathologically enlarged mediastinal or hilar lymph nodes. Please note that accurate exclusion of hilar  adenopathy is limited on noncontrast CT scans. Esophagus is unremarkable in appearance. No axillary lymphadenopathy. Lungs/Pleura: Areas of scarring or subsegmental atelectasis in the lower lobes of the lungs bilaterally. A few scattered small pulmonary nodules are noted in the lungs, largest of which is in the right middle lobe (axial image 84 of series 4) measuring 5 mm. No other larger more suspicious appearing pulmonary nodules or masses are noted. No acute consolidative airspace disease. No pleural effusions. Musculoskeletal: Large fatty attenuation lesion in the left chest wall extending into the left axillary region, incompletely imaged, but measuring at least 22.5 x 14.8 cm. There are no aggressive appearing lytic or blastic lesions noted in the visualized portions of the skeleton. CT ABDOMEN PELVIS FINDINGS Hepatobiliary: Diffuse low attenuation throughout the hepatic parenchyma, indicative of hepatic steatosis. No definite suspicious cystic or solid hepatic lesions are confidently identified on today's noncontrast CT examination. Intermediate attenuation lying dependently in the gallbladder, likely to represent tiny noncalcified gallstones. No findings to suggest an acute cholecystitis at this time. Pancreas: No pancreatic mass. No pancreatic ductal dilatation. No pancreatic or peripancreatic fluid collections or inflammatory changes. Spleen: Unremarkable. Adrenals/Urinary Tract: Nonobstructive calculi in the lower pole collecting system of left kidney measuring up to 4 mm. In addition, there is a 1 cm calculus at the left ureteropelvic junction associated with mild-to-moderate proximal hydronephrosis and extensive perinephric stranding indicating obstruction at this time. No additional calculi are noted in the collecting system of the right kidney, along the course of the right ureter or within the lumen of the urinary bladder. No right hydroureteronephrosis. Unenhanced appearance of the kidneys is  otherwise unremarkable.  Urinary bladder is nearly decompressed, but otherwise unremarkable in appearance. Bilateral adrenal glands are normal in appearance. Stomach/Bowel: Unenhanced appearance of the stomach is normal. No pathologic dilatation of small bowel or colon. The appendix is not confidently identified and may be surgically absent. Regardless, there are no inflammatory changes noted adjacent to the cecum to suggest the presence of an acute appendicitis at this time. 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 No lymphadenopathy noted in the abdomen or pelvis. Reproductive: Prostate gland and seminal vesicles are unremarkable in appearance. Other: No significant volume of ascites.  No pneumoperitoneum. Musculoskeletal: There are no aggressive appearing lytic or blastic lesions noted in the visualized portions of the skeleton. IMPRESSION: 1. 1 cm calculus at the left ureteropelvic junction with mild to moderate proximal left hydronephrosis and extensive perinephric stranding indicative of obstruction. 2. No acute findings noted in the thorax. 3. 2 additional nonobstructive calculi in the lower pole collecting system of left kidney measuring up to 4 mm. 4. Aortic atherosclerosis, in addition to left main and 3 vessel coronary artery disease. Assessment for potential risk factor modification, dietary therapy or pharmacologic therapy may be warranted, if clinically indicated. 5. Probable cholelithiasis without evidence of acute cholecystitis at this time. 6. Mild cardiomegaly. 7. 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 Electronically Signed   By: Vinnie Langton M.D.   On: 06/24/2019 15:16   Dg Abd 1 View  Result Date: 06/26/2019 CLINICAL DATA:   Hematemesis. EXAM: ABDOMEN - 1 VIEW COMPARISON:  CT 06/24/2019 FINDINGS: Upper abdomen, right lateral abdomen, left lateral abdomen are not included in the field of view. Prominent gaseous distention of stomach is partially included. Air-filled prominent colon. No small bowel obstruction. Left for ostomy tube is partially included. Bilateral iliac venous stents. IMPRESSION: 1. Prominent gaseous distention of the stomach, partially included in the field of view. 2. Prominent air-filled colon. No small bowel obstruction. Electronically Signed   By: Keith Rake M.D.   On: 06/26/2019 03:45   Ct Chest Wo Contrast  Result Date: 06/24/2019 CLINICAL DATA:  78 year old male with recent history of abdominal pain. EXAM: CT CHEST, ABDOMEN AND PELVIS WITHOUT CONTRAST TECHNIQUE: Multidetector CT imaging of the chest, abdomen and pelvis was performed following the standard protocol without IV contrast. COMPARISON:  No priors. FINDINGS: CT CHEST FINDINGS Cardiovascular: Heart size is mildly enlarged. There is no significant pericardial fluid, thickening or pericardial calcification. There is aortic atherosclerosis, as well as atherosclerosis of the great vessels of the mediastinum and the coronary arteries, including calcified atherosclerotic plaque in the left main, left anterior descending, left circumflex and right coronary arteries. Mediastinum/Nodes: No pathologically enlarged mediastinal or hilar lymph nodes. Please note that accurate exclusion of hilar adenopathy is limited on noncontrast CT scans. Esophagus is unremarkable in appearance. No axillary lymphadenopathy. Lungs/Pleura: Areas of scarring or subsegmental atelectasis in the lower lobes of the lungs bilaterally. A few scattered small pulmonary nodules are noted in the lungs, largest of which is in the right middle lobe (axial image 84 of series 4) measuring 5 mm. No other larger more suspicious appearing pulmonary nodules or masses are noted. No acute  consolidative airspace disease. No pleural effusions. Musculoskeletal: Large fatty attenuation lesion in the left chest wall extending into the left axillary region, incompletely imaged,  but measuring at least 22.5 x 14.8 cm. There are no aggressive appearing lytic or blastic lesions noted in the visualized portions of the skeleton. CT ABDOMEN PELVIS FINDINGS Hepatobiliary: Diffuse low attenuation throughout the hepatic parenchyma, indicative of hepatic steatosis. No definite suspicious cystic or solid hepatic lesions are confidently identified on today's noncontrast CT examination. Intermediate attenuation lying dependently in the gallbladder, likely to represent tiny noncalcified gallstones. No findings to suggest an acute cholecystitis at this time. Pancreas: No pancreatic mass. No pancreatic ductal dilatation. No pancreatic or peripancreatic fluid collections or inflammatory changes. Spleen: Unremarkable. Adrenals/Urinary Tract: Nonobstructive calculi in the lower pole collecting system of left kidney measuring up to 4 mm. In addition, there is a 1 cm calculus at the left ureteropelvic junction associated with mild-to-moderate proximal hydronephrosis and extensive perinephric stranding indicating obstruction at this time. No additional calculi are noted in the collecting system of the right kidney, along the course of the right ureter or within the lumen of the urinary bladder. No right hydroureteronephrosis. Unenhanced appearance of the kidneys is otherwise unremarkable. Urinary bladder is nearly decompressed, but otherwise unremarkable in appearance. Bilateral adrenal glands are normal in appearance. Stomach/Bowel: Unenhanced appearance of the stomach is normal. No pathologic dilatation of small bowel or colon. The appendix is not confidently identified and may be surgically absent. Regardless, there are no inflammatory changes noted adjacent to the cecum to suggest the presence of an acute appendicitis at  this time. 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 No lymphadenopathy noted in the abdomen or pelvis. Reproductive: Prostate gland and seminal vesicles are unremarkable in appearance. Other: No significant volume of ascites.  No pneumoperitoneum. Musculoskeletal: There are no aggressive appearing lytic or blastic lesions noted in the visualized portions of the skeleton. IMPRESSION: 1. 1 cm calculus at the left ureteropelvic junction with mild to moderate proximal left hydronephrosis and extensive perinephric stranding indicative of obstruction. 2. No acute findings noted in the thorax. 3. 2 additional nonobstructive calculi in the lower pole collecting system of left kidney measuring up to 4 mm. 4. Aortic atherosclerosis, in addition to left main and 3 vessel coronary artery disease. Assessment for potential risk factor modification, dietary therapy or pharmacologic therapy may be warranted, if clinically indicated. 5. Probable cholelithiasis without evidence of acute cholecystitis at this time. 6. Mild cardiomegaly. 7. 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 Electronically Signed   By: Vinnie Langton M.D.   On: 06/24/2019 15:16   Dg Chest Port 1 View  Result Date: 06/25/2019 CLINICAL DATA:  Hypoxia EXAM: PORTABLE CHEST 1 VIEW COMPARISON:  06/24/2019 FINDINGS: Cardiac shadow remains enlarged but accentuated by the portable technique. The lungs are well aerated bilaterally. No focal infiltrate or sizable effusion is noted. Minimal basilar atelectatic changes are seen. IMPRESSION: Mild basilar atelectasis similar to that seen on prior CT examination. Electronically Signed   By: Inez Catalina M.D.   On: 06/25/2019 09:42   Dg Chest Portable 1 View  Result  Date: 06/24/2019 CLINICAL DATA:  Shortness of breath. EXAM: PORTABLE CHEST 1 VIEW COMPARISON:  None. FINDINGS: Lung volumes are low but the lungs are clear. Heart size is upper normal. No pneumothorax or pleural fluid. No acute or focal bony abnormality. IMPRESSION: No acute disease. Electronically Signed   By: Marcello Moores  Dalessio M.D.   On: 06/24/2019 12:22   Dg Abd Portable 1v  Result Date: 06/26/2019 CLINICAL DATA:  NG tube placement EXAM: PORTABLE ABDOMEN - 1 VIEW COMPARISON:  06/26/2019 at 0337 hours FINDINGS: Single abdominal radiograph centered at the hemidiaphragms. Enteric tube looped in the distal esophagus. Gastric distension. Pigtail drain/nephrostomy catheter overlying the left mid abdomen, incompletely visualized. Lung bases are clear. IMPRESSION: Enteric tube looped in the distal esophagus. Advancement/adjustment is suggested. These results will be called to the ordering clinician or representative by the Radiologist Assistant, and communication documented in the PACS or zVision Dashboard. Electronically Signed   By: Julian Hy M.D.   On: 06/26/2019 07:47   Ir Nephrostomy Placement Left  Result Date: 06/25/2019 INDICATION: 78 year old male with a history of urosepsis structure ureteral stone EXAM: IMAGE GUIDED PERCUTANEOUS NEPHROSTOMY COMPARISON:  None. MEDICATIONS: Ciprofloxacin 400 mg IV; The antibiotic was administered in an appropriate time frame prior to skin puncture. ANESTHESIA/SEDATION: None The patient was continuously monitored during the procedure by the interventional radiology nurse under my direct supervision. CONTRAST:  15 cc-administered into the collecting system(s) FLUOROSCOPY TIME:  Fluoroscopy Time: 0 minutes 42 seconds (16.9 mGy). COMPLICATIONS: None PROCEDURE: Informed written consent was obtained from the patient after a thorough discussion of the procedural risks, benefits and alternatives. All questions were addressed. Maximal Sterile Barrier Technique was utilized  including caps, mask, sterile gowns, sterile gloves, sterile drape, hand hygiene and skin antiseptic. A timeout was performed prior to the initiation of the procedure. Patient positioned prone position on the fluoroscopy table. Ultrasound survey of the left flank was performed with images stored and sent to PACs. The patient was then prepped and draped in the usual sterile fashion. 1% lidocaine was used to anesthetize the skin and subcutaneous tissues for local anesthesia. A Chiba needle was then used to access a posterior inferior calyx with ultrasound guidance. With spontaneous urine returned through the needle, passage of an 018 micro wire into the collecting system was performed under fluoroscopy. A small incision was made with an 11 blade scalpel, and the needle was removed from the wire. An Accustick system was then advanced over the wire into the collecting system under fluoroscopy. The metal stiffener and inner dilator were removed, and then a sample of fluid was aspirated through the 4 French outer sheath. Bentson wire was passed into the collecting system and the sheath removed. Ten French dilation of the soft tissues was performed. Using modified Seldinger technique, a 10 French pigtail catheter drain was placed over the Bentson wire. Wire and inner stiffener removed, and the pigtail was formed in the collecting system. Sample was sent for culture. Small amount of contrast confirmed position of the catheter. Patient tolerated the procedure well and remained hemodynamically stable throughout. No complications were encountered and no significant blood loss encountered IMPRESSION: Status post image guided left-sided percutaneous nephrostomy. Signed, Dulcy Fanny. Dellia Nims, RPVI [facility] Electronically Signed   By: Corrie Mckusick D.O.   On: 06/25/2019 08:15    Assessment/Plan: 1.  Sepsis secondary to obstructing 1 cm left UPJ stone s/p left PCN  placement on 06/24/19- improving.  Urine cx grew proteus that is resistant only to macrobid.  Ok to transition to PO abx once he is tolerating PO.  Recommend at least 2 weeks of abx coverage.  Ok to remove Foley once the patient is more ambulatory.  I will arrange OP f/u in ~2 weeks to assess his overall status and plan  for definitive stone treatment, which will likely be in ~1 month.   Please call back with any questions or concerns   LOS: 2 days   Ellison Hughs, MD [facility] Pager: [phone] Cell: [phone]  06/26/2019, 10:43 AM

## 2019-06-26 NOTE — Progress Notes (Signed)
Patient with episode of emesis while rilling in the bed to bathe, change gown and bedding.  Just gave compizine at 1500

## 2019-06-27 LAB — CBC WITH DIFFERENTIAL/PLATELET
Abs Immature Granulocytes: 0.06 10*3/uL (ref 0.00–0.07)
Basophils Absolute: 0 10*3/uL (ref 0.0–0.1)
Basophils Relative: 0 %
Eosinophils Absolute: 0 10*3/uL (ref 0.0–0.5)
Eosinophils Relative: 0 %
HCT: 35.8 % — ABNORMAL LOW (ref 39.0–52.0)
Hemoglobin: 11.9 g/dL — ABNORMAL LOW (ref 13.0–17.0)
Immature Granulocytes: 1 %
Lymphocytes Relative: 8 %
Lymphs Abs: 0.8 10*3/uL (ref 0.7–4.0)
MCH: 31.2 pg (ref 26.0–34.0)
MCHC: 33.2 g/dL (ref 30.0–36.0)
MCV: 94 fL (ref 80.0–100.0)
Monocytes Absolute: 0.7 10*3/uL (ref 0.1–1.0)
Monocytes Relative: 6 %
Neutro Abs: 8.7 10*3/uL — ABNORMAL HIGH (ref 1.7–7.7)
Neutrophils Relative %: 85 %
Platelets: 140 10*3/uL — ABNORMAL LOW (ref 150–400)
RBC: 3.81 MIL/uL — ABNORMAL LOW (ref 4.22–5.81)
RDW: 14.6 % (ref 11.5–15.5)
WBC: 10.3 10*3/uL (ref 4.0–10.5)
nRBC: 0 % (ref 0.0–0.2)

## 2019-06-27 LAB — CULTURE, BLOOD (ROUTINE X 2)
Special Requests: ADEQUATE
Special Requests: ADEQUATE

## 2019-06-27 LAB — RENAL FUNCTION PANEL
Albumin: 2.4 g/dL — ABNORMAL LOW (ref 3.5–5.0)
Anion gap: 6 (ref 5–15)
BUN: 42 mg/dL — ABNORMAL HIGH (ref 8–23)
CO2: 28 mmol/L (ref 22–32)
Calcium: 8.1 mg/dL — ABNORMAL LOW (ref 8.9–10.3)
Chloride: 113 mmol/L — ABNORMAL HIGH (ref 98–111)
Creatinine, Ser: 1.29 mg/dL — ABNORMAL HIGH (ref 0.61–1.24)
GFR calc Af Amer: >60 mL/min (ref >60)
GFR calc non Af Amer: 52 mL/min — ABNORMAL LOW (ref >60)
Glucose, Bld: 113 mg/dL — ABNORMAL HIGH (ref 70–99)
Phosphorus: 2.2 mg/dL — ABNORMAL LOW (ref 2.5–4.6)
Potassium: 3.5 mmol/L (ref 3.5–5.1)
Sodium: 147 mmol/L — ABNORMAL HIGH (ref 135–145)

## 2019-06-27 LAB — MAGNESIUM: Magnesium: 2.2 mg/dL (ref 1.7–2.4)

## 2019-06-27 MED ORDER — HYDROCODONE-ACETAMINOPHEN 5-325 MG PO TABS
1.0000 | ORAL_TABLET | Freq: Two times a day (BID) | ORAL | Status: DC | PRN
  Administered 2019-06-27 – 2019-07-01 (×4): 1 via ORAL
  Filled 2019-06-27 (×4): qty 1

## 2019-06-27 MED ORDER — GERHARDT'S BUTT CREAM
TOPICAL_CREAM | CUTANEOUS | Status: DC | PRN
  Filled 2019-06-27: qty 1

## 2019-06-27 MED ORDER — CEFAZOLIN SODIUM-DEXTROSE 2-4 GM/100ML-% IV SOLN
2.0000 g | Freq: Three times a day (TID) | INTRAVENOUS | Status: DC
  Administered 2019-06-27 – 2019-06-30 (×8): 2 g via INTRAVENOUS
  Filled 2019-06-27 (×10): qty 100

## 2019-06-27 MED ORDER — IPRATROPIUM BROMIDE 0.02 % IN SOLN
0.5000 mg | Freq: Four times a day (QID) | RESPIRATORY_TRACT | Status: DC
  Administered 2019-06-27 – 2019-06-29 (×11): 0.5 mg via RESPIRATORY_TRACT
  Filled 2019-06-27 (×11): qty 2.5

## 2019-06-27 NOTE — Discharge Instructions (Signed)

## 2019-06-27 NOTE — Evaluation (Signed)
Physical Therapy Evaluation Patient Details Name: Patrick Bishop MRN: [id number] DOB: [birth year] Today's Date: 06/27/2019   History of Present Illness  78 y.o. male admitted with abdominal pain with UTI and sepsis. PMHx: OSA on CPAP; pulmonary HTN; obesity; afib; HTN; HLD; PVD s/p stents; and chronic diastolic CHF  Clinical Impression  Pt in chair on arrival having just transferred OOB via [place] with nursing. Pt able to stand from recliner with min assist, perform standing marching and limited gait. Pt with desaturation to 76% on 3L with HR 140 with limited activity and required seated rest and cues for recovery. Pt lives alone without family support and will benefit from ST-SNF to return to mod I prior to home. Pt with decreased strength, gait, transfers and mobility who will benefit from acute therapy to decrease burden of care and maximize function.  HR 115 at rest with SpO2 95% on 3L     Follow Up Recommendations SNF;Supervision/Assistance - 24 hour    Equipment Recommendations  3in1 (PT)    Recommendations for Other Services OT consult     Precautions / Restrictions Precautions Precautions: Fall Precaution Comments: watch sats      Mobility  Bed Mobility               General bed mobility comments: in chair on arrival  Transfers Overall transfer level: Needs assistance   Transfers: Sit to/from Stand Sit to Stand: Min assist         General transfer comment: min assist with cues for sequence, scooting to EOB, and assist to rise from surface x 2 trials  Ambulation/Gait Ambulation/Gait assistance: Min assist Gait Distance (Feet): 12 Feet Assistive device: Rolling walker (2 wheeled) Gait Pattern/deviations: Step-to pattern;Trunk flexed   Gait velocity interpretation: <1.8 ft/sec, indicate of risk for recurrent falls General Gait Details: pt able to walk 3' forward and back x 2 trials limited by fatigue with SpO2 dropping to 76% on 3L with activity with seated  rest and cues for pursed lip breathing with HR 140.  Stairs            Wheelchair Mobility    Modified Rankin (Stroke Patients Only)       Balance Overall balance assessment: Needs assistance   Sitting balance-Leahy Scale: Fair     Standing balance support: Bilateral upper extremity supported Standing balance-Leahy Scale: Poor                               Pertinent Vitals/Pain Pain Assessment: 0-10 Pain Score: 5  Pain Location: bil knees and back Pain Descriptors / Indicators: Aching Pain Intervention(s): Limited activity within patient's tolerance;RN gave pain meds during session;Repositioned    Home Living Family/patient expects to be discharged to:: Private residence Living Arrangements: Alone   Type of Home: Apartment Home Access: Level entry     Home Layout: One level Home Equipment: Environmental consultant - 2 wheels;Shower seat - built in;Grab bars - tub/shower;Grab bars - toilet;Cane - single point      Prior Function Level of Independence: Independent with assistive device(s)         Comments: pt normally walks 200' to dining hall for lunch/dinner with walker.  He performs ADLs on his own and does simple meal prep     Hand Dominance        Extremity/Trunk Assessment   Upper Extremity Assessment Upper Extremity Assessment: Generalized weakness    Lower Extremity Assessment Lower Extremity Assessment: Generalized  weakness    Cervical / Trunk Assessment Cervical / Trunk Assessment: Kyphotic  Communication   Communication: HOH  Cognition Arousal/Alertness: Awake/alert Behavior During Therapy: WFL for tasks assessed/performed Overall Cognitive Status: Within Functional Limits for tasks assessed                                        General Comments      Exercises General Exercises - Lower Extremity Long Arc Quad: AROM;Both;Seated;15 reps Hip Flexion/Marching: AROM;Both;Seated;10 reps;Standing(10 reps in sitting and  standing with RW)   Assessment/Plan    PT Assessment Patient needs continued PT services  PT Problem List Decreased strength;Decreased mobility;Decreased safety awareness;Decreased activity tolerance;Decreased balance;Decreased knowledge of use of DME;Cardiopulmonary status limiting activity       PT Treatment Interventions Gait training;Therapeutic exercise;Patient/family education;DME instruction;Therapeutic activities;Functional mobility training;Balance training    PT Goals (Current goals can be found in the Care Plan section)  Acute Rehab PT Goals Patient Stated Goal: return to my apartment, play games PT Goal Formulation: With patient Time For Goal Achievement: 07/11/19 Potential to Achieve Goals: Fair    Frequency Min 3X/week   Barriers to discharge Decreased caregiver support      Co-evaluation               AM-PAC PT "6 Clicks" Mobility  Outcome Measure Help needed turning from your back to your side while in a flat bed without using bedrails?: A Little Help needed moving from lying on your back to sitting on the side of a flat bed without using bedrails?: A Little Help needed moving to and from a bed to a chair (including a wheelchair)?: A Little Help needed standing up from a chair using your arms (e.g., wheelchair or bedside chair)?: A Little Help needed to walk in hospital room?: A Lot Help needed climbing 3-5 steps with a railing? : Total 6 Click Score: 15    End of Session Equipment Utilized During Treatment: Oxygen Activity Tolerance: Patient tolerated treatment well Patient left: in chair;with call bell/phone within reach Nurse Communication: Mobility status PT Visit Diagnosis: Other abnormalities of gait and mobility (R26.89);Muscle weakness (generalized) (M62.81)    Time: 2376-2831 PT Time Calculation (min) (ACUTE ONLY): 24 min   Charges:   PT Evaluation $PT Eval Moderate Complexity: 1 Mod          [place], PT Acute  Rehabilitation Services Pager: [phone] Office: [place] 06/27/2019, 1:13 PM

## 2019-06-27 NOTE — Progress Notes (Signed)
[name]  PROGRESS NOTE    Patrick Bishop  GYI:[id number] DOB: 1941/04/26 DOA: 06/24/2019 PCP: Lujean Amel, MD   Brief Narrative:   Patrick Bishop a 78 y.o.malewith medical history significant ofOSA on CPAP; pulmonary HTN; obesity; afib; HTN; HLD; PVD s/p stents; and chronic diastolic CHF presenting with abdominal pain.He reports continuous suprapubic pain without other urinary symptoms. ?fever at home, some chills. He developed n/v a couple of days ago and has been unable to take his meds. He felt SOB today.  9/30: N/V last night. Concern for ileus by on-call. Attempted NGT placement, but unsuccessful. Pt refusing further attempts.  10/1: Says he still has some nausea, but no vomiting. Feel much better after BM yesterday. Wants to try diet.   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 -    Morbid obesity (HCC)   Atrial fibrillation (HCC)   Hydronephrosis with obstructing calculus   Cholelithiasis   Elevated troponin   Septic shock due to Proteus bacteremia 2/2 UTI associated with an obstructing stone     - SIRS criteria in this patient includes:Fever, tachycardia, tachypnea, hypoxia     - Bld Cx: both growing gram negative rods - BCID with proteus; UCx: proteus     - now on rocephin     - Follow HIV     - S/p L nephrostomy placement with IR 9/28     - Urology following - pt needs 2 weeks abx coverage before any stone intervention will be pursued     - d/c'd foley; follow bladder scans     - PCCM was c/s on arrival and noted pt should be fine for progressive care - call if needed     - continuing rocephin for now and PO intake is still non-existent   Afib with RVR     - Patient with known afib     - Cardiology - recommends resume home metop 25 mg daily      - per Dr. Lovena Neighbours, [place] would be ok to start 9/30     - resume home meds  Elevated troponin     - Markedly elevated troponin with increasing delta     - Cardiology onboard: echo 9/29 noted; appreciate assistance     - Last cath was in 2011(?) in [place] with mild non-obstructive CAD     - per cards, this does not represent ACS and no further workup is necessary  Acute Kidney Injury NAGMA     - No known baseline creatinine known     - Improving after treatment of above     - Appears overloaded at this time, will hold off on additional IVF for now and follow UOP     - PO bicarb for NAGMA likely related to NS resuscitation and AKI     - lasix yesterday, SCr improved     - SCr improved today; have resumed home lasix regimen  Volume Overload Acute Hypoxic Respiratory Failure HFpEF     - Weight up 10 kg with resuscitation from day of admission to hospital day 1     - CT chest without acute findings in the thorax - this was non  contrast - he was anticoagulated on presentation, VTE less likely     - Currently on 6 L [place], will wean as tolerated     - home lasix is 20mg  PO BID 5 days/week and 40 mg BID 2 days/week     - currently NPO d/t N/V     - started home lasix regimen     - I/O, daily weights     - CXR 9/29 with mild basilar atelectasis     - no evidence of pulm edema on imaging; question if this is more of a COPD picture; adding atrovent to regimen and IS  HTN     - Hold PO Norvasc.     - metoprolol was resumed     - BP ok  HLD     - Continue Lipitor  Stasis dermatitis     - Marked LE edema which appears to be chronic     - His daughters reports that he gets skin breakdown and ulceration from SCDs     - Will use footpulse for now  PVD s/p stenting     - CAD seen on imaging on admission     - Needs CVD RF modification  OSA on CPAP     - Continue CPAP   Morbid obesity     - BMI48.8     - Weight loss should be encouraged     - Outpatient PCP/bariatric medicine/bariatric surgery f/u encouraged  Cholelithiasis     - Incidental finding on imaging today     - Outpatient f/u unless this becomes symptomatic  N/V     - zofran, phenergan     - no vomiting ON and stomach does not feel as distended; he reports feeling better and wants to try CLD; ordered  Debility Deconditioning     - PT eval  No vomiting ON. Says he still has some N but wants to try CLD. Have ordered. Needs PT eval. Lab work improving. Still need to wean O2. Adding atrovent and IS. He is ok for TTF (med-tele)  DVT prophylaxis: xarelto Code Status: FULL   Disposition Plan: TBD  Consultants:   Cardiology  Urology  IR  Antimicrobials:  . Rocephin   ROS:  Reports N. Denies CP, palpitations, V, ab pain. Remainder 10-pt ROS is negative for all not previously mentioned.  Subjective: "I feel better after the diarrhea."  Objective: Vitals:   06/27/19 0651 06/27/19 0654 06/27/19 0714 06/27/19 0729  BP:    131/74  Pulse: 85 100  84  Resp:   20 (!) 29  Temp:    98.7 F (37.1 C)  TempSrc:    Oral  SpO2:   97% 95%  Weight:      Height:        Intake/Output Summary (Last 24 hours) at 06/27/2019 0831 Last data filed at 06/27/2019 0600 Gross per 24 hour  Intake 600 ml  Output 700 ml  Net -100 ml   Filed Weights   06/24/19 1800 06/25/19 0500 06/26/19 0453  Weight: (!) 165.6 kg (!) 175.1 kg (!) 165.3 kg    Examination:  General: 78 y.o. male resting in bed in NAD Eyes: PERRL, normal sclera ENMT: Nares patent w/o discharge, orophaynx clear, dentition normal, ears w/o discharge/lesions/ulcers Cardiovascular: RRR, +S1, S2, no m/g/r, equal pulses throughout Respiratory: CTABL, no w/r/r, normal WOB GI: BS+, NDNT, no masses noted, no organomegaly noted, obese MSK: BLE edema w/ chronic changes, no c/c Neuro: A&O x 3, no  focal deficits Psyc: Appropriate  interaction and affect, calm/cooperative   Data Reviewed: I have personally reviewed following labs and imaging studies.  CBC: Recent Labs  Lab 06/24/19 1158 06/25/19 0816 06/26/19 0346 06/27/19 0156  WBC 9.4 12.1* 12.7* 10.3  NEUTROABS 8.5*  --   --  8.7*  HGB 13.0 10.9* 11.9* 11.9*  HCT 39.6 32.0* 35.3* 35.8*  MCV 95.0 93.8 92.7 94.0  PLT 149* 144* 140* 277*   Basic Metabolic Panel: Recent Labs  Lab 06/24/19 1158 06/25/19 0357 06/26/19 0346 06/27/19 0156  NA 141 138 142 147*  K 3.1* 4.9 4.2 3.5  CL 110 111 107 113*  CO2 17* 14* 24 28  GLUCOSE 122* 97 147* 113*  BUN 28* 35* 39* 42*  CREATININE 2.26* 1.77* 1.58* 1.29*  CALCIUM 8.6* 7.4* 7.8* 8.1*  MG  --  2.0 2.1 2.2  PHOS  --   --   --  2.2*   GFR: Estimated Creatinine Clearance: 74.9 mL/min (A) (by C-G formula based on SCr of 1.29 mg/dL (H)). Liver Function Tests: Recent Labs  Lab 06/24/19 1158 06/26/19 0346 06/27/19 0156  AST 41 85*  --   ALT 19 30  --   ALKPHOS 59 56  --   BILITOT 2.0* 1.6*  --   PROT 6.7 6.2*  --   ALBUMIN 3.4* 2.8* 2.4*   Recent Labs  Lab 06/24/19 1158  LIPASE 20   No results for input(s): AMMONIA in the last 168 hours. Coagulation Profile: Recent Labs  Lab 06/24/19 1158  INR 2.2*   Cardiac Enzymes: No results for input(s): CKTOTAL, CKMB, CKMBINDEX, TROPONINI in the last 168 hours. BNP (last 3 results) No results for input(s): PROBNP in the last 8760 hours. HbA1C: No results for input(s): HGBA1C in the last 72 hours. CBG: Recent Labs  Lab 06/25/19 2149  GLUCAP 157*   Lipid Profile: No results for input(s): CHOL, HDL, LDLCALC, TRIG, CHOLHDL, LDLDIRECT in the last 72 hours. Thyroid Function Tests: No results for input(s): TSH, T4TOTAL, FREET4, T3FREE, THYROIDAB in the last 72 hours. Anemia Panel: No results for input(s): VITAMINB12, FOLATE, FERRITIN, TIBC, IRON, RETICCTPCT in the last 72 hours. Sepsis Labs: Recent Labs  Lab 06/24/19 1400 06/24/19 1840 06/24/19  1841 06/24/19 2043 06/24/19 2306  PROCALCITON  --   --  57.08  --   --   LATICACIDVEN 3.0* 2.4*  --  2.5* 1.9    Recent Results (from the past 240 hour(s))  Culture, blood (routine x 2)     Status: Abnormal (Preliminary result)   Collection Time: 06/24/19 11:58 AM   Specimen: BLOOD RIGHT HAND  Result Value Ref Range Status   Specimen Description BLOOD RIGHT HAND  Final   Special Requests   Final    BOTTLES DRAWN AEROBIC AND ANAEROBIC Blood Culture adequate volume   Culture  Setup Time   Final    IN BOTH AEROBIC AND ANAEROBIC BOTTLES GRAM NEGATIVE RODS CRITICAL RESULT CALLED TO, READ BACK BY AND VERIFIED WITH: G. ABBOTT,PHARMD 0509 06/25/2019 T. TYSOR    Culture (A)  Final    PROTEUS MIRABILIS SUSCEPTIBILITIES TO FOLLOW Performed at [facility], [address] [place], [place] [address]    Report Status PENDING  Incomplete  Blood Culture ID Panel (Reflexed)     Status: Abnormal   Collection Time: 06/24/19 11:58 AM  Result Value Ref Range Status   Enterococcus species NOT DETECTED NOT DETECTED Final   Listeria monocytogenes NOT DETECTED NOT DETECTED Final  Staphylococcus species NOT DETECTED NOT DETECTED Final   Staphylococcus aureus (BCID) NOT DETECTED NOT DETECTED Final   Streptococcus species NOT DETECTED NOT DETECTED Final   Streptococcus agalactiae NOT DETECTED NOT DETECTED Final   Streptococcus pneumoniae NOT DETECTED NOT DETECTED Final   Streptococcus pyogenes NOT DETECTED NOT DETECTED Final   Acinetobacter baumannii NOT DETECTED NOT DETECTED Final   Enterobacteriaceae species DETECTED (A) NOT DETECTED Final    Comment: Enterobacteriaceae represent a large family of gram-negative bacteria, not a single organism. CRITICAL RESULT CALLED TO, READ BACK BY AND VERIFIED WITH: G. ABBOTT,PHARMD [id number] 06/25/2019 T. TYSOR    Enterobacter cloacae complex NOT DETECTED NOT DETECTED Final   Escherichia coli NOT DETECTED NOT DETECTED Final   Klebsiella oxytoca NOT DETECTED  NOT DETECTED Final   Klebsiella pneumoniae NOT DETECTED NOT DETECTED Final   Proteus species DETECTED (A) NOT DETECTED Final    Comment: CRITICAL RESULT CALLED TO, READ BACK BY AND VERIFIED WITH: G. ABBOTT,PHARMD 0509 06/25/2019 T. TYSOR    Serratia marcescens NOT DETECTED NOT DETECTED Final   Carbapenem resistance NOT DETECTED NOT DETECTED Final   Haemophilus influenzae NOT DETECTED NOT DETECTED Final   Neisseria meningitidis NOT DETECTED NOT DETECTED Final   Pseudomonas aeruginosa NOT DETECTED NOT DETECTED Final   Candida albicans NOT DETECTED NOT DETECTED Final   Candida glabrata NOT DETECTED NOT DETECTED Final   Candida krusei NOT DETECTED NOT DETECTED Final   Candida parapsilosis NOT DETECTED NOT DETECTED Final   Candida tropicalis NOT DETECTED NOT DETECTED Final    Comment: Performed at [facility], [place] [address], [place], [place] [address]  SARS Coronavirus 2 [facility] order, Performed in [facility] hospital lab) Nasopharyngeal Nasopharyngeal Swab     Status: None   Collection Time: 06/24/19 11:59 AM   Specimen: Nasopharyngeal Swab  Result Value Ref Range Status   SARS Coronavirus 2 NEGATIVE NEGATIVE Final    Comment: (NOTE) If result is NEGATIVE SARS-CoV-2 target nucleic acids are NOT DETECTED. The SARS-CoV-2 RNA is generally detectable in upper and lower  respiratory specimens during the acute phase of infection. The lowest  concentration of SARS-CoV-2 viral copies this assay can detect is 250  copies / mL. A negative result does not preclude SARS-CoV-2 infection  and should not be used as the sole basis for treatment or other  patient management decisions.  A negative result may occur with  improper specimen collection / handling, submission of specimen other  than nasopharyngeal swab, presence of viral mutation(s) within the  areas targeted by this assay, and inadequate number of viral copies  (<250 copies / mL). A negative result must be combined with clinical   observations, patient history, and epidemiological information. If result is POSITIVE SARS-CoV-2 target nucleic acids are DETECTED. The SARS-CoV-2 RNA is generally detectable in upper and lower  respiratory specimens dur ing the acute phase of infection.  Positive  results are indicative of active infection with SARS-CoV-2.  Clinical  correlation with patient history and other diagnostic information is  necessary to determine patient infection status.  Positive results do  not rule out bacterial infection or co-infection with other viruses. If result is PRESUMPTIVE POSTIVE SARS-CoV-2 nucleic acids MAY BE PRESENT.   A presumptive positive result was obtained on the submitted specimen  and confirmed on repeat testing.  While 2019 novel coronavirus  (SARS-CoV-2) nucleic acids may be present in the submitted sample  additional confirmatory testing may be necessary for epidemiological  and / or clinical management  purposes  to differentiate between  SARS-CoV-2 and other Sarbecovirus currently known to infect humans.  If clinically indicated additional testing with an alternate test  methodology [phone]) is advised. The SARS-CoV-2 RNA is generally  detectable in upper and lower respiratory sp ecimens during the acute  phase of infection. The expected result is Negative. Fact Sheet for Patients:  StrictlyIdeas.no Fact Sheet for Healthcare Providers: BankingDealers.co.za This test is not yet approved or cleared by the [place] FDA and has been authorized for detection and/or diagnosis of SARS-CoV-2 by FDA under an Emergency Use Authorization (EUA).  This EUA will remain in effect (meaning this test can be used) for the duration of the COVID-19 declaration under Section 564(b)(1) of the Act, 21 U.S.C. section 360bbb-3(b)(1), unless the authorization is terminated or revoked sooner. Performed at [facility], [place] [address],  [place], [place] [address]   Culture, blood (routine x 2)     Status: Abnormal (Preliminary result)   Collection Time: 06/24/19  2:10 PM   Specimen: BLOOD LEFT HAND  Result Value Ref Range Status   Specimen Description BLOOD LEFT HAND  Final   Special Requests   Final    BOTTLES DRAWN AEROBIC AND ANAEROBIC Blood Culture adequate volume   Culture  Setup Time   Final    GRAM NEGATIVE RODS IN BOTH AEROBIC AND ANAEROBIC BOTTLES CRITICAL RESULT CALLED TO, READ BACK BY AND VERIFIED WITH: PHARMD J LEDFORD [id number] AT 642 AM BY CM Performed at [facility], [place] [address] [place], [place] [address]    Culture PROTEUS MIRABILIS (A)  Final   Report Status PENDING  Incomplete  Urine culture     Status: Abnormal   Collection Time: 06/24/19  2:30 PM   Specimen: Urine, Catheterized  Result Value Ref Range Status   Specimen Description URINE, CATHETERIZED  Final   Special Requests   Final    NONE Performed at [facility], [place] [address] [place], [place] [address]    Culture >=100,000 COLONIES/mL PROTEUS MIRABILIS (A)  Final   Report Status 06/26/2019 FINAL  Final   Organism ID, Bacteria PROTEUS MIRABILIS (A)  Final      Susceptibility   Proteus mirabilis - MIC*    AMPICILLIN <=2 SENSITIVE Sensitive     CEFAZOLIN <=4 SENSITIVE Sensitive     CEFTRIAXONE <=1 SENSITIVE Sensitive     CIPROFLOXACIN <=0.25 SENSITIVE Sensitive     GENTAMICIN <=1 SENSITIVE Sensitive     IMIPENEM 2 SENSITIVE Sensitive     NITROFURANTOIN 256 RESISTANT Resistant     TRIMETH/SULFA <=20 SENSITIVE Sensitive     AMPICILLIN/SULBACTAM <=2 SENSITIVE Sensitive     PIP/TAZO <=4 SENSITIVE Sensitive     * >=100,000 COLONIES/mL PROTEUS MIRABILIS  Aerobic/Anaerobic Culture (surgical/deep wound)     Status: None (Preliminary result)   Collection Time: 06/24/19  5:44 PM   Specimen: Abscess  Result Value Ref Range Status   Specimen Description ABSCESS LEFT PCN  Final   Special Requests NONE  Final   Gram Stain   Final     ABUNDANT WBC PRESENT,BOTH PMN AND MONONUCLEAR ABUNDANT GRAM VARIABLE ROD Performed at [facility], [address] [place], [place] [address]    Culture   Final    FEW PROTEUS MIRABILIS SUSCEPTIBILITIES TO FOLLOW NO ANAEROBES ISOLATED; CULTURE IN PROGRESS FOR 5 DAYS    Report Status PENDING  Incomplete  MRSA PCR Screening     Status: None   Collection Time:  06/25/19  5:13 AM   Specimen: Nasal Mucosa; Nasopharyngeal  Result Value Ref Range Status   MRSA by PCR NEGATIVE NEGATIVE Final    Comment:        The GeneXpert MRSA Assay (FDA approved for NASAL specimens only), is one component of a comprehensive MRSA colonization surveillance program. It is not intended to diagnose MRSA infection nor to guide or monitor treatment for MRSA infections. Performed at [facility], [place] [address] [place], [place] [address]   C Difficile Quick Screen w PCR reflex     Status: None   Collection Time: 06/26/19  5:30 PM   Specimen: Stool  Result Value Ref Range Status   C Diff antigen NEGATIVE NEGATIVE Final   C Diff toxin NEGATIVE NEGATIVE Final   C Diff interpretation No C. difficile detected.  Final    Comment: Performed at [facility], [place] [address] [place], [place] [address]      Radiology Studies: Dg Abd 1 View  Result Date: 06/26/2019 CLINICAL DATA:  Hematemesis. EXAM: ABDOMEN - 1 VIEW COMPARISON:  CT 06/24/2019 FINDINGS: Upper abdomen, right lateral abdomen, left lateral abdomen are not included in the field of view. Prominent gaseous distention of stomach is partially included. Air-filled prominent colon. No small bowel obstruction. Left for ostomy tube is partially included. Bilateral iliac venous stents. IMPRESSION: 1. Prominent gaseous distention of the stomach, partially included in the field of view. 2. Prominent air-filled colon. No small bowel obstruction. Electronically Signed   By: Keith Rake M.D.   On: 06/26/2019 03:45   Dg Chest Port 1 View   Result Date: 06/25/2019 CLINICAL DATA:  Hypoxia EXAM: PORTABLE CHEST 1 VIEW COMPARISON:  06/24/2019 FINDINGS: Cardiac shadow remains enlarged but accentuated by the portable technique. The lungs are well aerated bilaterally. No focal infiltrate or sizable effusion is noted. Minimal basilar atelectatic changes are seen. IMPRESSION: Mild basilar atelectasis similar to that seen on prior CT examination. Electronically Signed   By: Inez Catalina M.D.   On: 06/25/2019 09:42   Dg Abd Portable 1v  Result Date: 06/26/2019 CLINICAL DATA:  NG tube placement EXAM: PORTABLE ABDOMEN - 1 VIEW COMPARISON:  06/26/2019 at 0337 hours FINDINGS: Single abdominal radiograph centered at the hemidiaphragms. Enteric tube looped in the distal esophagus. Gastric distension. Pigtail drain/nephrostomy catheter overlying the left mid abdomen, incompletely visualized. Lung bases are clear. IMPRESSION: Enteric tube looped in the distal esophagus. Advancement/adjustment is suggested. These results will be called to the ordering clinician or representative by the Radiologist Assistant, and communication documented in the PACS or zVision Dashboard. Electronically Signed   By: Julian Hy M.D.   On: 06/26/2019 07:47     Scheduled Meds: . atorvastatin  10 mg Oral Daily  . bisacodyl  10 mg Rectal Daily  . Chlorhexidine Gluconate Cloth  6 each Topical Daily  . furosemide  20 mg Oral BID  . ipratropium  0.5 mg Nebulization QID  . levalbuterol  0.63 mg Nebulization QID  . metoprolol succinate  25 mg Oral Daily  . rivaroxaban  20 mg Oral Q supper  . sodium bicarbonate  650 mg Oral TID  . sodium chloride flush  3 mL Intravenous Q12H   Continuous Infusions: . cefTRIAXone (ROCEPHIN)  IV 2 g (06/26/19 1016)     LOS: 3 days    Time spent: 35 minutes spent in the coordination of care today.    Jonnie Finner, DO [facility] Pager [phone]  If 7PM-7AM, please  contact night-coverage [email or web address] Password [email or web address]  06/27/2019, 8:31 AM

## 2019-06-28 ENCOUNTER — Encounter (HOSPITAL_COMMUNITY): Payer: Self-pay | Admitting: Internal Medicine

## 2019-06-28 DIAGNOSIS — R710 Precipitous drop in hematocrit: Secondary | ICD-10-CM

## 2019-06-28 DIAGNOSIS — I4811 Longstanding persistent atrial fibrillation: Secondary | ICD-10-CM

## 2019-06-28 DIAGNOSIS — R195 Other fecal abnormalities: Secondary | ICD-10-CM

## 2019-06-28 DIAGNOSIS — I871 Compression of vein: Secondary | ICD-10-CM

## 2019-06-28 DIAGNOSIS — I4891 Unspecified atrial fibrillation: Secondary | ICD-10-CM

## 2019-06-28 DIAGNOSIS — I872 Venous insufficiency (chronic) (peripheral): Secondary | ICD-10-CM

## 2019-06-28 DIAGNOSIS — I10 Essential (primary) hypertension: Secondary | ICD-10-CM

## 2019-06-28 DIAGNOSIS — G4733 Obstructive sleep apnea (adult) (pediatric): Secondary | ICD-10-CM

## 2019-06-28 DIAGNOSIS — Z9989 Dependence on other enabling machines and devices: Secondary | ICD-10-CM

## 2019-06-28 DIAGNOSIS — K8020 Calculus of gallbladder without cholecystitis without obstruction: Secondary | ICD-10-CM

## 2019-06-28 DIAGNOSIS — R778 Other specified abnormalities of plasma proteins: Secondary | ICD-10-CM

## 2019-06-28 DIAGNOSIS — N132 Hydronephrosis with renal and ureteral calculous obstruction: Secondary | ICD-10-CM

## 2019-06-28 DIAGNOSIS — I5032 Chronic diastolic (congestive) heart failure: Secondary | ICD-10-CM

## 2019-06-28 LAB — HEMOGLOBIN AND HEMATOCRIT, BLOOD
HCT: 34.4 % — ABNORMAL LOW (ref 39.0–52.0)
Hemoglobin: 11.7 g/dL — ABNORMAL LOW (ref 13.0–17.0)

## 2019-06-28 MED ORDER — AMLODIPINE BESYLATE 10 MG PO TABS
10.0000 mg | ORAL_TABLET | Freq: Every day | ORAL | Status: DC
  Administered 2019-06-28 – 2019-06-29 (×2): 10 mg via ORAL
  Filled 2019-06-28 (×2): qty 1

## 2019-06-28 MED ORDER — PANTOPRAZOLE SODIUM 40 MG PO TBEC
40.0000 mg | DELAYED_RELEASE_TABLET | Freq: Every day | ORAL | Status: DC
  Administered 2019-06-28 – 2019-07-02 (×5): 40 mg via ORAL
  Filled 2019-06-28 (×5): qty 1

## 2019-06-28 NOTE — Progress Notes (Signed)
SATURATION QUALIFICATIONS: (This note is used to comply with regulatory documentation for home oxygen)  Patient Saturations on Room Air at Rest = 92%  Patient Saturations on Room Air while Ambulating = 80-85%  Patient Saturations on 2 Liters of oxygen while Ambulating = 94%  Please briefly explain why patient needs home oxygen: Pt at rest  Stats  >90s. Pt gets out of breath with little movement. Desats while ambulating. Require 2L to maintain sats >90.

## 2019-06-28 NOTE — Evaluation (Signed)
Occupational Therapy Evaluation Patient Details Name: Patrick Bishop MRN: [id number] DOB: Nov 19, 1940 Today's Date: 06/28/2019    History of Present Illness 78 y.o. male admitted with abdominal pain with UTI and sepsis. PMHx: OSA on CPAP; pulmonary HTN; obesity; afib; HTN; HLD; PVD s/p stents; and chronic diastolic CHF   Clinical Impression   This 78 yo male admitted with above presents to acute OT with decreased mobility, decreased balance, increased work of breathing with decrease sats on RA with ambulation all affecting his safety and independence with basic ADLs. He will benefit from acute OT with follow up OT at SNF. Normally pt is Mod I at rollator level and does not wear O2 at home.    Follow Up Recommendations  SNF;Supervision/Assistance - 24 hour    Equipment Recommendations  Other (comment)(TBD at next venue)       Precautions / Restrictions Precautions Precautions: Fall Precaution Comments: watch sats and HR (goal is 65-105) Restrictions Weight Bearing Restrictions: No      Mobility Bed Mobility Overal bed mobility: Needs Assistance Bed Mobility: Supine to Sit     Supine to sit: Mod assist     General bed mobility comments: lifting assist for trunk  Transfers Overall transfer level: Needs assistance Equipment used: Rolling walker (2 wheeled) Transfers: Sit to/from Stand Sit to Stand: Min assist;From elevated surface         General transfer comment: elevated bed to help with sit to stand, then stood from w/c in hallway pulling up on walker with min A    Balance Overall balance assessment: Needs assistance Sitting-balance support: No upper extremity supported;Feet supported Sitting balance-Leahy Scale: Good     Standing balance support: Bilateral upper extremity supported Standing balance-Leahy Scale: Poor Standing balance comment: leaning over and heavy UE support on walker                           ADL either performed or assessed with  clinical judgement   ADL Overall ADL's : Needs assistance/impaired Eating/Feeding: Independent;Sitting   Grooming: Set up;Sitting   Upper Body Bathing: Set up;Sitting   Lower Body Bathing: Moderate assistance Lower Body Bathing Details (indicate cue type and reason): min A sit<>stand Upper Body Dressing : Set up;Sitting   Lower Body Dressing: Maximal assistance Lower Body Dressing Details (indicate cue type and reason): min A sit<>stand; pt reports he usually turns sideways on bed to don socks Toilet Transfer: Minimal assistance;+2 for safety/equipment;RW   Toileting- Clothing Manipulation and Hygiene: Moderate assistance Toileting - Clothing Manipulation Details (indicate cue type and reason): min A sit<>stand             Vision Patient Visual Report: No change from baseline              Pertinent Vitals/Pain Pain Assessment: Faces Faces Pain Scale: Hurts little more Pain Location: generalized with mobility Pain Descriptors / Indicators: Aching Pain Intervention(s): Monitored during session;Limited activity within patient's tolerance     Hand Dominance Right   Extremity/Trunk Assessment Upper Extremity Assessment Upper Extremity Assessment: Generalized weakness           Communication Communication Communication: HOH   Cognition Arousal/Alertness: Awake/alert Behavior During Therapy: WFL for tasks assessed/performed Overall Cognitive Status: Within Functional Limits for tasks assessed  General Comments  edema R UE and bilateral LE's R >L            Home Living Family/patient expects to be discharged to:: Skilled nursing facility Living Arrangements: Alone   Type of Home: Apartment Home Access: Level entry     Home Layout: One level     Bathroom Shower/Tub: Occupational psychologist: Handicapped height     Home Equipment: Shower seat - built in;Grab bars - tub/shower;Grab bars -  toilet;Cane - single point;Walker - 4 wheels          Prior Functioning/Environment Level of Independence: Independent with assistive device(s)        Comments: pt normally walks 200' to dining hall for lunch/dinner with walker.  He performs ADLs on his own and does simple meal prep        OT Problem List: Decreased strength;Impaired balance (sitting and/or standing);Decreased activity tolerance;Obesity;Pain      OT Treatment/Interventions: Self-care/ADL training;DME and/or AE instruction;Patient/family education;Balance training    OT Goals(Current goals can be found in the care plan section) Acute Rehab OT Goals Patient Stated Goal: to go back home OT Goal Formulation: With patient Time For Goal Achievement: 07/12/19 Potential to Achieve Goals: Good  OT Frequency: Min 2X/week   Barriers to D/C: Decreased caregiver support          Co-evaluation PT/OT/SLP Co-Evaluation/Treatment: Yes Reason for Co-Treatment: For patient/therapist safety;To address functional/ADL transfers PT goals addressed during session: Mobility/safety with mobility;Balance OT goals addressed during session: ADL's and self-care;Strengthening/ROM      AM-PAC OT "6 Clicks" Daily Activity     Outcome Measure Help from another person eating meals?: None Help from another person taking care of personal grooming?: A Little Help from another person toileting, which includes using toliet, bedpan, or urinal?: A Lot Help from another person bathing (including washing, rinsing, drying)?: A Little Help from another person to put on and taking off regular upper body clothing?: A Lot Help from another person to put on and taking off regular lower body clothing?: A Lot 6 Click Score: 16   End of Session Equipment Utilized During Treatment: Gait belt;Rolling walker(O2 2 liters) Nurse Communication: (sats in 87-88% on RA when we arrived, dropped to same level when ambulating on RA)  Activity Tolerance: Patient  tolerated treatment well Patient left: (sitting EOB)  OT Visit Diagnosis: Unsteadiness on feet (R26.81);Other abnormalities of gait and mobility (R26.89);Muscle weakness (generalized) (M62.81)                Time: 3299-2426 OT Time Calculation (min): 28 min Charges:  OT General Charges $OT Visit: 1 Visit OT Evaluation $OT Eval Moderate Complexity: [place], OTR/L Acute [employer] Pager [phone] Office [phone]     Almon Register 06/28/2019, 3:52 PM

## 2019-06-28 NOTE — Progress Notes (Signed)
Physical Therapy Treatment Patient Details Name: Patrick Bishop MRN: [id number] DOB: 03/20/1941 Today's Date: 06/28/2019    History of Present Illness 78 y.o. male admitted with abdominal pain with UTI and sepsis. PMHx: OSA on CPAP; pulmonary HTN; obesity; afib; HTN; HLD; PVD s/p stents; and chronic diastolic CHF    PT Comments    Patient progressing some with ambulation this session, but with definite oxygen needs.  Currently min A for short distance ambulation.  Discussed with pt recommendation for SNF prior to home alone.  He is not completely opposed, but wants to think about it.  PT to follow acutely.    Follow Up Recommendations  SNF;Supervision/Assistance - 24 hour     Equipment Recommendations  3in1 (PT)    Recommendations for Other Services       Precautions / Restrictions Precautions Precautions: Fall Precaution Comments: watch sats    Mobility  Bed Mobility Overal bed mobility: Needs Assistance Bed Mobility: Supine to Sit     Supine to sit: Mod assist     General bed mobility comments: lifting assist for trunk  Transfers Overall transfer level: Needs assistance Equipment used: Rolling walker (2 wheeled) Transfers: Sit to/from Stand Sit to Stand: Min assist;From elevated surface         General transfer comment: elevated bed to help with sit to stand, then stood from w/c in hallway pulling up on walker with min A  Ambulation/Gait Ambulation/Gait assistance: Min assist;+2 safety/equipment Gait Distance (Feet): 25 Feet(x 2) Assistive device: Rolling walker (2 wheeled) Gait Pattern/deviations: Step-to pattern;Trunk flexed;Decreased stride length;Shuffle     General Gait Details: flexed trunk, dragging his feet somewhat with R leg bigger than L.  Encouraged upright posture and pursed lip breathing during one seated rest.  HR max 133 with ambulation, SpO2 dropping to 80's on RA (acutally prior to ambulation) applied O2 @ 2LPM with SpO2 in 90's thereafter,  RN aware   Stairs             Wheelchair Mobility    Modified Rankin (Stroke Patients Only)       Balance Overall balance assessment: Needs assistance   Sitting balance-Leahy Scale: Fair     Standing balance support: Bilateral upper extremity supported Standing balance-Leahy Scale: Poor Standing balance comment: leaning over and heavy UE support on walker                            Cognition Arousal/Alertness: Awake/alert Behavior During Therapy: WFL for tasks assessed/performed Overall Cognitive Status: Within Functional Limits for tasks assessed                                        Exercises      General Comments General comments (skin integrity, edema, etc.): edema R UE and bilateral LE's R >L      Pertinent Vitals/Pain Pain Assessment: Faces Faces Pain Scale: Hurts little more Pain Location: generalized with mobility Pain Descriptors / Indicators: Aching Pain Intervention(s): Monitored during session;Limited activity within patient's tolerance    Home Living                      Prior Function            PT Goals (current goals can now be found in the care plan section) Progress towards PT goals: Progressing toward goals  Frequency           PT Plan Current plan remains appropriate    Co-evaluation PT/OT/SLP Co-Evaluation/Treatment: Yes Reason for Co-Treatment: For patient/therapist safety;To address functional/ADL transfers PT goals addressed during session: Mobility/safety with mobility;Balance        AM-PAC PT "6 Clicks" Mobility   Outcome Measure  Help needed turning from your back to your side while in a flat bed without using bedrails?: A Little Help needed moving from lying on your back to sitting on the side of a flat bed without using bedrails?: A Little Help needed moving to and from a bed to a chair (including a wheelchair)?: A Little Help needed standing up from a chair using  your arms (e.g., wheelchair or bedside chair)?: A Little Help needed to walk in hospital room?: A Lot Help needed climbing 3-5 steps with a railing? : Total 6 Click Score: 15    End of Session Equipment Utilized During Treatment: Gait belt;Oxygen Activity Tolerance: Patient limited by fatigue Patient left: in bed;with call bell/phone within reach(sitting EOB to eat lunch) Nurse Communication: Mobility status;Other (comment)(O2 needs) PT Visit Diagnosis: Other abnormalities of gait and mobility (R26.89);Muscle weakness (generalized) (M62.81)     Time: 4825-0037 PT Time Calculation (min) (ACUTE ONLY): 28 min  Charges:  $Gait Training: 8-22 mins                     Magda Kiel, [place] [phone] 06/28/2019    Reginia Naas 06/28/2019, 1:59 PM

## 2019-06-28 NOTE — Consult Note (Addendum)
Consultation  Referring Provider: [facility]/Dr. Louanne Belton  primary Care Physician:  Lujean Amel, MD Primary Gastroenterologist:  None/ prior GI work-up in [place]  Reason for Consultation: Dark stools, heme positive  HPI: Patrick Bishop is a 78 y.o. male, who was admitted on 06/24/2019 with abdominal pain nausea vomiting and dyspnea.  Found to be septic secondary to pyelonephritis, and left UPJ stone.  He has history of atrial fibrillation, has been on chronic Xarelto, history of coronary artery disease, peripheral vascular disease, pulmonary hypertension  chronic congestive heart failure, morbid obesity and chronic lymphedema lower extremities. He underwent left percutaneous nephrostomy per IR on 06/24/2019.  His sepsis is resolving, and acute kidney injury improved.  He is requiring low-dose oxygen.  CT of the abdomen and pelvis done on 06/24/2019 showed a few small scattered pulmonary nodules the largest in the right middle lobe, a large fatty attenuation lesion in the left axilla consistent with lipoma, hepatic steatosis, tiny noncalcified gallstones and a left UPJ stone. He was continued on Xarelto. He has been noted to have loose dark stools over the last couple of days.  He did have stool for C. difficile quick scan and GI path panel done since admission both negative.  He has been documented heme positive. Hemoglobin has been relatively stable, hemoglobin 13 on admission, on 06/26/2019 hemoglobin 11.9 and yesterday hemoglobin 11.9, pending today.  Patient says he developed nausea and vomiting on the day prior to admission had several episodes of vomiting and says the material was very dark in color but no obvious blood.  He had at least one episode of this after admission.  Stools noticed to be very dark after that.  He has not had a bowel movement today.  He has no complaints of heartburn or indigestion no dysphasia.  No prior EGD as far as he is aware.  No chronic GERD and not on PPI. Says  his abdominal pain has improved but not resolved, nephrostomy tube on the left in place.  There is a small amount of blood in the nephrostomy bag.  He has been started on oral Protonix today, Xarelto to be placed on hold.  Patient had a gastroenterologist in [place], has history of colon polyps presumably adenomatous and has had 3 prior colonoscopies.  His daughter did bring those reports with her today.  He had several polyps on colonoscopy 2008, follow-up colonoscopy 2010 believe with 4 polyps removed.  Last colonoscopy was in 2015 with 1 small polyp removed.  That indicates 5-year interval follow-up,  no path reports included.  He does have family history of colon cancer  Past Medical History:  Diagnosis Date  . Chronic diastolic heart failure (HCC)    Normal LV Fxn by Echo 04/2017   . Chronic venous insufficiency 2015   s/p Bilateral Iliac & Femoral Stents (including IVC) - DEEP Venous Reflux.  Also s/p Bilateral GSV Ablation.  . Essential hypertension   . Hyperlipidemia with target LDL less than 100   . Longstanding persistent atrial fibrillation   . Obesity   . OSA on CPAP    May need New CPAP machine & CPAP MD)  . Pulmonary hypertension ([place])     Past Surgical History:  Procedure Laterality Date  . ABDOMINAL VENOUS DUPLEX Bilateral 07/2014   ([facility]: Dr. Cleda Mccreedy): (prior to Bilateral Iliac-Femoral Venous Stent Placement): Patent IVC.  No DVT B/L Iliac or Com fem V..  +++ DEEP V REFLUX B/L ILIAC & FEM V.  Absent Superficial V  Reflux B/L x L SSV (too tortuous for intervention).  BL GSV occluded - prior ablation.   . ABDOMINAL VENOUS DUPLEX Bilateral 11/2014   Absent DVT.  Left common femoral venous reflux: 1508 MS left common femoral vein, 2284MS left proximal femoral vein -> cystic structure noted in the left popliteal vein (7.6 x 1.4 x 3.8 cm).;  Right leg deep venous reflux noted in the distal iliac, common femoral, proximal profunda femoral, proximal superficial femoral,  superficial femoral and popliteal vein.  [name] CARDIAC CATHETERIZATION  10/2009   [facility] - Dr. 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  EF~45%.  Mild pulmonary pretension.  [name] CARDIAC PET  08/2009   Inferolateral partially reversible defect -- > false positive by cath  . GREATER SAPHENOUS VEIN ABLATION Bilateral Before 2015   [facility] (Dr. Eugenie Filler)  . IR NEPHROSTOMY PLACEMENT LEFT  06/24/2019  . IR TRANSCATH PLC STENT  EA ADD VEIN  INC ANGIOPLASTY Left 10/2014   [facility], Dr. 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 IR TRANSCATH PLC STENT  INITIAL VEIN  INC ANGIOPLASTY Right 08/2014   [facility] - Dr. Stormy Fabian): US Guided --> R Common & External Iliac, Common Femoral Venography with Placement of 24 mmx 70 mm Stent - R Com Iliac-Ext Iliac- & Com Fem V. reducing 60-80% venous compression to ~0%).;; also noted significant L-sided venous compression (staged stenting).   [name] NM MYOVIEW LTD  11/2005   Adenosine Myoview [facility] - Dr. Cleda Mccreedy): Normal myocardial perfusion scan. No ischemia or infarction.  . TRANSTHORACIC ECHOCARDIOGRAM  04/26/2017    [facility] - Dr. Cleda Mccreedy) - report not available.  Per clinic note: Normal global LV function. Mild concentric temperature. Mild TR. Mild pulmonary hypertension.;;   . TRANSTHORACIC ECHOCARDIOGRAM  7/'15; 7/'17   [facility] - Dr. Saverio Danker) a) low normal LV function (EF 56%). Moderate concentric LVH. Mild LA dilation. Normal pulmonary pressures.  ;; b) normal global function - EF 60-65%. Moderate pulmonary hypertension. Biatrial enlargement. Moderate concentric LVH.  [name] TRANSTHORACIC ECHOCARDIOGRAM  02/2010   [facility] Card[facility] - Dr. Saverio Danker): Normal global LV function. Mild LVH. EF 60%.; November 2010. Moderate LV dilation. Moderate LVH. Normal EF 50-55%. Mild to moderate pulmonary hypertension. (July 2012 showed normal function with mild LVH, mild MR noted in August 2014)    Prior to Admission medications   Medication Sig Start Date End Date Taking? Authorizing Provider  amLODipine (NORVASC) 10 MG tablet TAKE 1 TABLET DAILY Patient taking differently: Take 10 mg by mouth daily.  06/04/19  Yes [name], MD  atorvastatin (LIPITOR) 10 MG tablet TAKE 1 TABLET DAILY AT 6 P.M. Patient taking differently: Take 10 mg by mouth daily.  11/30/18  Yes [name], MD  furosemide (LASIX) 40 MG tablet Take  20 mg  ( 1/2 tablet) twice a day except on Monday and Thursday take 40 mg twice a day. Patient taking differently: Take 20-40 mg by mouth See admin instructions. Take  20 mg  ( 1/2 tablet) twice a day except on Monday and Thursday take 40 mg twice a day. 07/02/18  Yes [name], MD  HYDROcodone-acetaminophen (NORCO/VICODIN) 5-325 MG tablet Take 1 tablet  by mouth 2 (two) times daily.  09/04/17  Yes [provider]  metoprolol tartrate (LOPRESSOR) 25 MG tablet TAKE ONE TABLET (25 MG TOTAL) EVERY MORNING AND ONE AND ONE-HALF TABLETS (37.5 MG TOTAL) EVERY EVENING Patient taking differently: Take 25-37.5 mg by mouth See admin instructions. Take 25mg  in the morning and 37.5mg  in the evening. 04/19/19  Yes [name], MD  XARELTO 20 MG TABS tablet TAKE 1 TABLET DAILY WITH SUPPER Patient taking differently: Take 20 mg by mouth daily with supper.  11/09/18  Yes [name], MD    Current Facility-Administered Medications  Medication Dose Route Frequency Provider Last Rate Last Dose  . acetaminophen (TYLENOL) tablet 650 mg  650 mg Oral Q6H PRN Karmen Bongo, MD       Or  . acetaminophen (TYLENOL) suppository 650 mg  650 mg Rectal Q6H PRN Karmen Bongo, MD      . amLODipine (NORVASC) tablet 10 mg  10 mg  Oral Daily Pokhrel, Laxman, MD   10 mg at 06/28/19 1326  . atorvastatin (LIPITOR) tablet 10 mg  10 mg Oral Daily Karmen Bongo, MD   10 mg at 06/28/19 0919  . bisacodyl (DULCOLAX) suppository 10 mg  10 mg Rectal Daily Kyle, Tyrone A, DO      . ceFAZolin (ANCEF) IVPB 2g/100 mL premix  2 g Intravenous Q8H Kyle, Tyrone A, DO 200 mL/hr at 06/28/19 1331 2 g at 06/28/19 1331  . Chlorhexidine Gluconate Cloth 2 % PADS 6 each  6 each Topical Daily Elodia Florence., MD   6 each at 06/28/19 1332  . furosemide (LASIX) tablet 20 mg  20 mg Oral BID Marylyn Ishihara, Tyrone A, DO   20 mg at 06/28/19 3557  . Gerhardt's butt cream   Topical PRN Marylyn Ishihara, Tyrone A, DO      . HYDROcodone-acetaminophen (NORCO/VICODIN) 5-325 MG per tablet 1 tablet  1 tablet Oral Q12H PRN Marylyn Ishihara, Tyrone A, DO   1 tablet at 06/28/19 1327  . ipratropium (ATROVENT) nebulizer solution 0.5 mg  0.5 mg Nebulization QID Kyle, Tyrone A, DO   0.5 mg at 06/28/19 1316  . levalbuterol (XOPENEX) nebulizer solution 0.63 mg  0.63 mg Nebulization Q6H PRN Elodia Florence., MD      . levalbuterol [facility]) nebulizer solution 0.63 mg  0.63 mg Nebulization QID Elodia Florence., MD   0.63 mg at 06/28/19 1316  . metoprolol succinate (TOPROL-XL) 24 hr tablet 25 mg  25 mg Oral Daily O'Neal, Cassie Freer, MD   25 mg at 06/28/19 0919  . pantoprazole (PROTONIX) EC tablet 40 mg  40 mg Oral Daily Pokhrel, Laxman, MD   40 mg at 06/28/19 1327  . polyethylene glycol (MIRALAX / GLYCOLAX) packet 17 g  17 g Oral Daily PRN Karmen Bongo, MD      . prochlorperazine (COMPAZINE) injection 10 mg  10 mg Intravenous Q4H PRN Gardiner Barefoot, NP   10 mg at 06/26/19 1648  . promethazine (PHENERGAN) injection 12.5 mg  12.5 mg Intravenous Q8H PRN Marylyn Ishihara, Tyrone A, DO   12.5 mg at 06/26/19 1200   Or  . promethazine (PHENERGAN) suppository 25 mg  25 mg Rectal Q8H PRN Marylyn Ishihara, Tyrone A, DO      . rivaroxaban (XARELTO) tablet 20 mg  20 mg Oral Q supper Marylyn Ishihara, Tyrone A, DO   20 mg at  06/27/19 1626  . sodium chloride flush (NS) 0.9 % injection 3 mL  3 mL Intravenous Q12H Yates,  Anderson Malta, MD   3 mL at 06/28/19 5784    Allergies as of 06/24/2019 - Review Complete 06/24/2019  Allergen Reaction Noted  . Aldactone [spironolactone]  11/14/2017    Family History  Problem Relation Age of Onset  . Cancer Mother   . Heart attack Father 47  . Hypertension Brother   . Cancer Sister   . Other Neg Hx        He really does not know much about his parents and siblings health, but they did not speak about healthcare    Social History   Socioeconomic History  . Marital status: Widowed    Spouse name: Not on file  . Number of children: 2  . Years of education: Not on file  . Highest education level: Some college, no degree  Occupational History  . 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 Occupation: Lobbyist: [employer]    Comment: followed by several odd jobs  . 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 Financial resource strain: Not on file  . Food insecurity    Worry: Not on file    Inability: Not on file  . Transportation needs    Medical: Not on file    Non-medical: Not on file  Tobacco Use  . Smoking status: Former Smoker    Packs/day: 1.00    Years: 35.00    Pack years: 35.00    Types: Cigarettes    Quit date: 1996    Years since quitting: 24.7  . Smokeless tobacco: Never Used  . Tobacco comment: smoked from age [age]   Substance and Sexual Activity  . Alcohol use: No    Frequency: Never    Comment: Not any more  . Drug use: No  . Sexual activity: Not Currently  Lifestyle  . Physical activity    Days per week: Not on file    Minutes per session: Not on file  . Stress: Not on file  Relationships  . Social Herbalist on phone: Not on file    Gets together: Not on file    Attends religious service: Not on file    Active member of club or organization: Not  on file    Attends meetings of clubs or organizations: Not on file    Relationship status: Not on file  . Intimate partner violence    Fear of current or ex partner: Not on file    Emotionally abused: Not on file    Physically abused: Not on file    Forced sexual activity: Not on file  Other Topics Concern  . Not on file  Social History Narrative   Wetzel recently moved to [place] in January 2019.  This was to be close to his daughter who is here with him today.  He has 2 daughters and one grandchild.      Jacqualine Code,  [place] --PCP was Sherlynn Carbon, MD.  Cardiologist: Saverio Danker, MD (who followed him every 3 months with routine visits.  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   He currently lives at [place] [facility] (Dr. Albesa Seen) --however he hopes to be moving into his daughter's house once they have not established and ready for him.    Review of Systems: Pertinent positive and negative review  of systems were noted in the above HPI section.  All other review of systems was otherwise negative.  Physical Exam: Vital signs in last 24 hours: Temp:  [98 F (36.7 C)-98.5 F (36.9 C)] 98.4 F (36.9 C) (10/02 1238) Pulse Rate:  [82-117] 91 (10/02 1238) Resp:  [20-32] 32 (10/02 1238) BP: (118-150)/(63-83) 130/69 (10/02 1238) SpO2:  [95 %-100 %] 100 % (10/02 1317) Weight:  [160.2 kg] 160.2 kg (10/02 0309) Last BM Date: 06/27/19 General:   Alert,  Well-developed, elderly, obese, well-nourished, pleasant and cooperative in NAD.  Daughter at bedside Head:  Normocephalic and atraumatic. Eyes:  Sclera clear, no icterus.   Conjunctiva pink. Ears:  Normal auditory acuity. Nose:  No deformity, discharge,  or lesions. Mouth:  No deformity or lesions.   Neck:  Supple; no masses or thyromegaly. Lungs:  Clear throughout to auscultation.   No wheezes, crackles, or rhonchi.   Heart:  irRegular rate and rhythm; no murmurs, clicks, rubs,  or gallops. Abdomen:  Obese, soft, bowel sounds are present, no focal tenderness, no palpable mass or hepatosplenomegaly Rectal:  Deferred  Msk:  Symmetrical with significant chronic stasis changes bilateral lower extremities. Pulses:  Normal pulses noted. Extremities: Significant chronic lower extremity edema Neurologic:  Alert and  oriented x4;  grossly normal neurologically. Skin:  Intact without significant lesions or rashes.. Psych:  Alert and cooperative. Normal mood and affect.  Intake/Output from previous day: 10/01 0701 - 10/02 0700 In: 962 [P.O.:462] Out: 950 [Urine:950] Intake/Output this shift: Total I/O In: 540 [P.O.:540] Out: 550 [Urine:550]  Lab Results: Recent Labs    06/26/19 0346 06/27/19 0156  WBC 12.7* 10.3  HGB 11.9* 11.9*  HCT 35.3* 35.8*  PLT 140* 140*   BMET Recent Labs    06/26/19 0346 06/27/19 0156  NA 142 147*  K 4.2 3.5  CL 107 113*  CO2 24 28  GLUCOSE 147* 113*  BUN 39* 42*  CREATININE 1.58* 1.29*  CALCIUM 7.8* 8.1*   LFT Recent Labs    06/26/19 0346 06/27/19 0156  PROT 6.2*  --   ALBUMIN 2.8* 2.4*  AST 85*  --   ALT 30  --   ALKPHOS 56  --   BILITOT 1.6*  --     IMPRESSION:  #67 78 year old white male with dark heme positive stool x4 days, coffee-ground emesis by patient report on admit, in setting of chronic anticoagulation with Xarelto.  Patient was admitted with abdominal pain nausea and vomiting and found septic secondary to pyelonephritis and left UPJ stone.  He is status post left percutaneous nephrostomy 06/24/2019.  Sepsis resolving, acute kidney injury improving  Hemoglobin has been stable, no evidence of active bleeding.  No further nausea or vomiting, last bowel movement yesterday  Etiology of heme positive stool is not definite, however with history suspect acute gastropathy or esophagitis  #2 history of colon polyps, presumed adenomatous, last  colonoscopy 2015 done in [place]. #3 chronic congestive heart failure, pulmonary hypertension 4.  Peripheral vascular disease status prior stents 5.  Chronic lower extremity stasis/lymphedema severe #6 obesity 7.  Atrial fibrillation 8.  Coronary artery disease  Plan; Agree with holding Xarelto short-term observe for couple of days Check hemoglobin this afternoon and every 12 hours Continue oral Protonix Patient is still recuperating from sepsis, still requiring oxygen, and is high risk  for sedation.  Would not plan endoscopic evaluation at this time unless he has evidence of active GI bleeding.  If evidence of then, drop in hemoglobin  would pursue EGD.  We will follow along with you.   Amy Esterwood PA-C 06/28/2019, 2:38 PM    [facility] Attending   I have taken an interval history, reviewed the chart and examined the patient. I agree with the Advanced Practitioner's note, impression and recommendations.   Multiple reasons for decline in Hgb Reserve endoscopy for potential therapy.  Gatha Mayer, MD, [facility] 06/28/2019 8:03 PM Pager [phone]

## 2019-06-28 NOTE — Progress Notes (Addendum)
PROGRESS NOTE  Patrick Bishop BDZ:[id number] DOB: [birth year] DOA: 06/24/2019 PCP: Lujean Amel, MD   LOS: 4 days   Brief narrative/HPI:  Patrick Bishop a 78 y.o.malewith medical history significant ofOSA on CPAP; pulmonary HTN; obesity; afib; HTN; HLD; PVD s/p stents; and chronic diastolic CHF presented to hospital with abdominal pain.He reported continuous suprapubic pain without other urinary symptoms.  He did have subjective fever at home, some chills. He developed n/v a couple of days ago and has been unable to take his meds. He felt SOB on the day of presentation.  On 9/30 he had nausea vomiting and there was concern for ileus.  NG tube was attempted but was unsuccessful.  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  Patient was seen by urology Dr. Gilford Rile.  Blood culture showing Proteus.  On Rocephin IV.  Patient is status post left nephrostomy placement on 06/24/2019.  Patient was seen by urology and recommend 2 weeks of antibiotics prior to stone intervention.  Blood pressure is significantly improved.  Latest blood pressure of 150/63.  Leukocytosis has improved WBC of 10.3 today.  Latest temperature of 98.3 F.  Black stool as per the patient.  Patient denies taking any iron.  Hemoglobin has remained stable at 11.9 x 2 days.   Currently on anticoagulation with Xarelto.  Check  stool for occult blood.  Add Protonix empirically. Had occult blood positive on 06/26/19.  Will consult GI.  I spoke with patient about the same that he wishes to talk to GI.  He stated that he had endoscopy colonoscopy almost 5 years back and had polyps and hemorrhoids at that time.  He also stated difficulty with breathing when he had the procedure but had twice in the past.  Atrial fibrillation with rapid ventricular response.  Rate controlled at this time.  History of atrial fibrillation in the past.  Patient was resumed on his home metoprolol.  Cardiology has seen the patient at this time.  He has been resumed on anticoagulation with Xarelto.  Elevated troponin - Markedly elevated troponin with increasing delta.  Cardiology was consulted.  No indication of acute coronary syndrome as per cardiology.  No further work-up was advised.  2D echocardiogram on 06/25/2019 showed the left ventricular ejection fraction of 60 to 65%. Last cath was in 2011(?) in [place] with mild non-obstructive CAD.  Continue Lipitor  Acute Kidney Injury On presentation.  Unknown baseline creatinine levels..  Has improved at this time.  On p.o. bicarb.  Patient has been started on p.o. Lasix.  Closely monitor creatinine levels.  Volume Overload with Acute Hypoxic Respiratory Failure-resolved at this time. - Weight up 10 kg with resuscitation from day of admission to hospital day 1.  Has been restarted on oral diuretic regimen. CT chest without acute findings in the thorax . home lasix is 20mg  PO BID 5 days/week  and 40 mg BID 2 days/week  Essential HTN -Metoprolol has been resumed.  Norvasc still on hold.  Blood pressure today is 150/63 today.  Resume Norvasc.  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  Continue supportive stool for occult blood.  PVD s/p stenting -Continue  current medication including statins.  OSA on CPAP -Continue at nighttime and during sleeping  Morbid obesity - BMI48.8 -Lifestyle modification discussed - Outpatient PCP/bariatric medicine/bariatric surgery f/u encouraged  Cholelithiasis - Incidental finding on imaging  - Outpatient f/u unless this becomes symptomatic  Debility/Deconditioning     - PT eval recommended skilled nursing facility placement  VTE Prophylaxis: xarelto  Code Status:  Full   Family Communication:  I spoke with the patient's daughter on the phone and updated her about the clinical condition of the patient.  Patient's daughter stated that he had undergone colonoscopy,endoscopy back in [place] almost 5 years back and had polyps and hemorrhoids at that time.  Patient daughter did defer the decision of endoscopy/colonoscopy if needed to the patient.  Disposition Plan: Patient has been seen by physical therapy and recommended skilled nursing facility placement for rehabilitation.  Advance diet today.  Add amlodipine.  Will consult GI.   Consultants:  Cardiology  Urology  IR  Procedures:  none  Antibiotics: Rocephin  Anti-infectives (From admission, onward)   Start     Dose/Rate Route Frequency Ordered Stop   06/27/19 2200  ceFAZolin (ANCEF) IVPB 2g/100 mL premix     2 g 200 mL/hr over 30 Minutes Intravenous Every 8 hours 06/27/19 1120     06/25/19 0700  cefTRIAXone (ROCEPHIN) 2 g in sodium chloride 0.9 % 100 mL IVPB  Status:  Discontinued     2 g 200 mL/hr over 30 Minutes Intravenous Daily 06/25/19 0520 06/27/19 1120   06/25/19 0630  ceFEPIme (MAXIPIME) 2 g in sodium chloride 0.9 % 100 mL IVPB  Status:  Discontinued     2 g 200 mL/hr over 30 Minutes Intravenous Every 12 hours 06/24/19 1818 06/25/19 0518   06/24/19 1715  ciprofloxacin (CIPRO) IVPB 400 mg  Status:  Discontinued     400 mg 200 mL/hr over 60 Minutes Intravenous Every 12 hours 06/24/19 1707 06/25/19 0924    06/24/19 1702  ciprofloxacin (CIPRO) 400 MG/200ML IVPB    Note to Pharmacy: [name]   : cabinet override      06/24/19 1702 06/25/19 0514   06/24/19 1700  ceFEPIme (MAXIPIME) 2 g in sodium chloride 0.9 % 100 mL IVPB     2 g 200 mL/hr over 30 Minutes Intravenous  Once 06/24/19 1653 06/24/19 2115   06/24/19 1400  cefTRIAXone (ROCEPHIN) 1 g in sodium chloride 0.9 % 100 mL IVPB  Status:  Discontinued     1 g 200 mL/hr over 30 Minutes Intravenous Every 24 hours 06/24/19 1349 06/24/19 1708       Subjective: Today, patient feels little better.  Denies any fever, chills or rigor.  He is however been complaining of some black stools with occasional abdominal cramps.  Objective: Vitals:   06/28/19 0753 06/28/19 0918  BP: (!) 150/63 (!) 150/63  Pulse: (!) 117 95  Resp: (!) 30   Temp: 98.3 F (36.8 C)   SpO2: 98%     Intake/Output Summary (Last 24 hours) at 06/28/2019 1113 Last data filed at 06/28/2019 0900 Gross per 24 hour  Intake 740 ml  Output 900 ml  Net -160 ml  Filed Weights   06/25/19 0500 06/26/19 0453 06/28/19 0309  Weight: (!) 175.1 kg (!) 165.3 kg (!) 160.2 kg   Body mass index is 46.6 kg/m.   Physical Exam: GENERAL: Patient is alert awake and oriented. Not in obvious distress.  Morbidly obese  HENT: No scleral pallor or icterus. Pupils equally reactive to light. Oral mucosa is moist NECK: is supple, no palpable thyroid enlargement. CHEST: Diminished breath sounds bilaterally. CVS: S1 and S2 heard, no murmur. Regular rate and rhythm. No pericardial rub. ABDOMEN: Soft, non-tender, bowel sounds are present.  On condom catheter. No palpable hepato-splenomegaly. EXTREMITIES: Bilateral lower extremity with chronic venous insufficiency/chronic stasis dermatitis. CNS: Cranial nerves are intact. No focal motor or sensory deficits. SKIN: warm and dry without rashes.  Data Review: I have personally reviewed the following laboratory data and studies,  CBC:  Recent Labs  Lab 07/17/2019 1158 06/25/19 0816 06/26/19 0346 06/27/19 0156  WBC 9.4 12.1* 12.7* 10.3  NEUTROABS 8.5*  --   --  8.7*  HGB 13.0 10.9* 11.9* 11.9*  HCT 39.6 32.0* 35.3* 35.8*  MCV 95.0 93.8 92.7 94.0  PLT 149* 144* 140* 259*   Basic Metabolic Panel: Recent Labs  Lab Jul 17, 2019 1158 06/25/19 0357 06/26/19 0346 06/27/19 0156  NA 141 138 142 147*  K 3.1* 4.9 4.2 3.5  CL 110 111 107 113*  CO2 17* 14* 24 28  GLUCOSE 122* 97 147* 113*  BUN 28* 35* 39* 42*  CREATININE 2.26* 1.77* 1.58* 1.29*  CALCIUM 8.6* 7.4* 7.8* 8.1*  MG  --  2.0 2.1 2.2  PHOS  --   --   --  2.2*   Liver Function Tests: Recent Labs  Lab 07-17-2019 1158 06/26/19 0346 06/27/19 0156  AST 41 85*  --   ALT 19 30  --   ALKPHOS 59 56  --   BILITOT 2.0* 1.6*  --   PROT 6.7 6.2*  --   ALBUMIN 3.4* 2.8* 2.4*   Recent Labs  Lab 07-17-2019 1158  LIPASE 20   No results for input(s): AMMONIA in the last 168 hours. Cardiac Enzymes: No results for input(s): CKTOTAL, CKMB, CKMBINDEX, TROPONINI in the last 168 hours. BNP (last 3 results) No results for input(s): BNP in the last 8760 hours.  ProBNP (last 3 results) No results for input(s): PROBNP in the last 8760 hours.  CBG: Recent Labs  Lab 06/25/19 2149  GLUCAP 157*   Recent Results (from the past 240 hour(s))  Culture, blood (routine x 2)     Status: Abnormal   Collection Time: Jul 17, 2019 11:58 AM   Specimen: BLOOD RIGHT HAND  Result Value Ref Range Status   Specimen Description BLOOD RIGHT HAND  Final   Special Requests   Final    BOTTLES DRAWN AEROBIC AND ANAEROBIC Blood Culture adequate volume   Culture  Setup Time   Final    IN BOTH AEROBIC AND ANAEROBIC BOTTLES GRAM NEGATIVE RODS CRITICAL RESULT CALLED TO, READ BACK BY AND VERIFIED WITH: Salli Real [id number] 06/25/2019 Mena Goes Performed at [facility], [place] [address], [place], [place] [address]    Culture PROTEUS MIRABILIS (A)  Final   Report Status 06/27/2019 FINAL  Final    Organism ID, Bacteria PROTEUS MIRABILIS  Final      Susceptibility   Proteus mirabilis - MIC*    AMPICILLIN <=2 SENSITIVE Sensitive     CEFAZOLIN <=4 SENSITIVE Sensitive     CEFEPIME <=1 SENSITIVE Sensitive  CEFTAZIDIME <=1 SENSITIVE Sensitive     CEFTRIAXONE <=1 SENSITIVE Sensitive     CIPROFLOXACIN <=0.25 SENSITIVE Sensitive     GENTAMICIN <=1 SENSITIVE Sensitive     IMIPENEM 1 SENSITIVE Sensitive     TRIMETH/SULFA <=20 SENSITIVE Sensitive     AMPICILLIN/SULBACTAM <=2 SENSITIVE Sensitive     PIP/TAZO <=4 SENSITIVE Sensitive     * PROTEUS MIRABILIS  Blood Culture ID Panel (Reflexed)     Status: Abnormal   Collection Time: 06/24/19 11:58 AM  Result Value Ref Range Status   Enterococcus species NOT DETECTED NOT DETECTED Final   Listeria monocytogenes NOT DETECTED NOT DETECTED Final   Staphylococcus species NOT DETECTED NOT DETECTED Final   Staphylococcus aureus (BCID) NOT DETECTED NOT DETECTED Final   Streptococcus species NOT DETECTED NOT DETECTED Final   Streptococcus agalactiae NOT DETECTED NOT DETECTED Final   Streptococcus pneumoniae NOT DETECTED NOT DETECTED Final   Streptococcus pyogenes NOT DETECTED NOT DETECTED Final   Acinetobacter baumannii NOT DETECTED NOT DETECTED Final   Enterobacteriaceae species DETECTED (A) NOT DETECTED Final    Comment: Enterobacteriaceae represent a large family of gram-negative bacteria, not a single organism. CRITICAL RESULT CALLED TO, READ BACK BY AND VERIFIED WITH: G. ABBOTT,PHARMD [id number] 06/25/2019 T. TYSOR    Enterobacter cloacae complex NOT DETECTED NOT DETECTED Final   Escherichia coli NOT DETECTED NOT DETECTED Final   Klebsiella oxytoca NOT DETECTED NOT DETECTED Final   Klebsiella pneumoniae NOT DETECTED NOT DETECTED Final   Proteus species DETECTED (A) NOT DETECTED Final    Comment: CRITICAL RESULT CALLED TO, READ BACK BY AND VERIFIED WITH: G. ABBOTT,PHARMD 0509 06/25/2019 T. TYSOR    Serratia marcescens NOT DETECTED NOT  DETECTED Final   Carbapenem resistance NOT DETECTED NOT DETECTED Final   Haemophilus influenzae NOT DETECTED NOT DETECTED Final   Neisseria meningitidis NOT DETECTED NOT DETECTED Final   Pseudomonas aeruginosa NOT DETECTED NOT DETECTED Final   Candida albicans NOT DETECTED NOT DETECTED Final   Candida glabrata NOT DETECTED NOT DETECTED Final   Candida krusei NOT DETECTED NOT DETECTED Final   Candida parapsilosis NOT DETECTED NOT DETECTED Final   Candida tropicalis NOT DETECTED NOT DETECTED Final    Comment: Performed at [facility], [place] [address], [place], [place] [address]  SARS Coronavirus 2 [facility] order, Performed in [facility] hospital lab) Nasopharyngeal Nasopharyngeal Swab     Status: None   Collection Time: 06/24/19 11:59 AM   Specimen: Nasopharyngeal Swab  Result Value Ref Range Status   SARS Coronavirus 2 NEGATIVE NEGATIVE Final    Comment: (NOTE) If result is NEGATIVE SARS-CoV-2 target nucleic acids are NOT DETECTED. The SARS-CoV-2 RNA is generally detectable in upper and lower  respiratory specimens during the acute phase of infection. The lowest  concentration of SARS-CoV-2 viral copies this assay can detect is 250  copies / mL. A negative result does not preclude SARS-CoV-2 infection  and should not be used as the sole basis for treatment or other  patient management decisions.  A negative result may occur with  improper specimen collection / handling, submission of specimen other  than nasopharyngeal swab, presence of viral mutation(s) within the  areas targeted by this assay, and inadequate number of viral copies  (<250 copies / mL). A negative result must be combined with clinical  observations, patient history, and epidemiological information. If result is POSITIVE SARS-CoV-2 target nucleic acids are DETECTED. The SARS-CoV-2 RNA is generally detectable in upper and lower  respiratory specimens dur  ing the acute phase of infection.  Positive  results are  indicative of active infection with SARS-CoV-2.  Clinical  correlation with patient history and other diagnostic information is  necessary to determine patient infection status.  Positive results do  not rule out bacterial infection or co-infection with other viruses. If result is PRESUMPTIVE POSTIVE SARS-CoV-2 nucleic acids MAY BE PRESENT.   A presumptive positive result was obtained on the submitted specimen  and confirmed on repeat testing.  While 2019 novel coronavirus  (SARS-CoV-2) nucleic acids may be present in the submitted sample  additional confirmatory testing may be necessary for epidemiological  and / or clinical management purposes  to differentiate between  SARS-CoV-2 and other Sarbecovirus currently known to infect humans.  If clinically indicated additional testing with an alternate test  methodology [phone]) is advised. The SARS-CoV-2 RNA is generally  detectable in upper and lower respiratory sp ecimens during the acute  phase of infection. The expected result is Negative. Fact Sheet for Patients:  StrictlyIdeas.no Fact Sheet for Healthcare Providers: BankingDealers.co.za This test is not yet approved or cleared by the [place] FDA and has been authorized for detection and/or diagnosis of SARS-CoV-2 by FDA under an Emergency Use Authorization (EUA).  This EUA will remain in effect (meaning this test can be used) for the duration of the COVID-19 declaration under Section 564(b)(1) of the Act, 21 U.S.C. section 360bbb-3(b)(1), unless the authorization is terminated or revoked sooner. Performed at [facility], [place] [address] [place], [place] [address]   Culture, blood (routine x 2)     Status: Abnormal   Collection Time: 06/24/19  2:10 PM   Specimen: BLOOD LEFT HAND  Result Value Ref Range Status   Specimen Description BLOOD LEFT HAND  Final   Special Requests   Final    BOTTLES DRAWN AEROBIC AND  ANAEROBIC Blood Culture adequate volume   Culture  Setup Time   Final    GRAM NEGATIVE RODS IN BOTH AEROBIC AND ANAEROBIC BOTTLES CRITICAL RESULT CALLED TO, READ BACK BY AND VERIFIED WITH: PHARMD J LEDFORD [id number] AT 642 AM BY CM    Culture (A)  Final    PROTEUS MIRABILIS SUSCEPTIBILITIES PERFORMED ON PREVIOUS CULTURE WITHIN THE LAST 5 DAYS. Performed at [facility], [place] [address], [place], [place] [address]    Report Status 06/27/2019 FINAL  Final  Urine culture     Status: Abnormal   Collection Time: 06/24/19  2:30 PM   Specimen: Urine, Catheterized  Result Value Ref Range Status   Specimen Description URINE, CATHETERIZED  Final   Special Requests   Final    NONE Performed at [facility], [place] [address], [place], [place] [address]    Culture >=100,000 COLONIES/mL PROTEUS MIRABILIS (A)  Final   Report Status 06/26/2019 FINAL  Final   Organism ID, Bacteria PROTEUS MIRABILIS (A)  Final      Susceptibility   Proteus mirabilis - MIC*    AMPICILLIN <=2 SENSITIVE Sensitive     CEFAZOLIN <=4 SENSITIVE Sensitive     CEFTRIAXONE <=1 SENSITIVE Sensitive     CIPROFLOXACIN <=0.25 SENSITIVE Sensitive     GENTAMICIN <=1 SENSITIVE Sensitive     IMIPENEM 2 SENSITIVE Sensitive     NITROFURANTOIN 256 RESISTANT Resistant     TRIMETH/SULFA <=20 SENSITIVE Sensitive     AMPICILLIN/SULBACTAM <=2 SENSITIVE Sensitive     PIP/TAZO <=4 SENSITIVE Sensitive     * >=100,000 COLONIES/mL PROTEUS MIRABILIS  Aerobic/Anaerobic Culture (  surgical/deep wound)     Status: None (Preliminary result)   Collection Time: 06/24/19  5:44 PM   Specimen: Abscess  Result Value Ref Range Status   Specimen Description ABSCESS LEFT PCN  Final   Special Requests NONE  Final   Gram Stain   Final    ABUNDANT WBC PRESENT,BOTH PMN AND MONONUCLEAR ABUNDANT GRAM VARIABLE ROD Performed at [facility], [place] [address], [place], [place] [address]    Culture   Final    FEW PROTEUS MIRABILIS NO ANAEROBES  ISOLATED; CULTURE IN PROGRESS FOR 5 DAYS    Report Status PENDING  Incomplete   Organism ID, Bacteria PROTEUS MIRABILIS  Final      Susceptibility   Proteus mirabilis - MIC*    AMPICILLIN <=2 SENSITIVE Sensitive     CEFAZOLIN <=4 SENSITIVE Sensitive     CEFEPIME <=1 SENSITIVE Sensitive     CEFTAZIDIME <=1 SENSITIVE Sensitive     CEFTRIAXONE <=1 SENSITIVE Sensitive     CIPROFLOXACIN <=0.25 SENSITIVE Sensitive     GENTAMICIN <=1 SENSITIVE Sensitive     IMIPENEM 2 SENSITIVE Sensitive     TRIMETH/SULFA <=20 SENSITIVE Sensitive     AMPICILLIN/SULBACTAM <=2 SENSITIVE Sensitive     PIP/TAZO <=4 SENSITIVE Sensitive     * FEW PROTEUS MIRABILIS  MRSA PCR Screening     Status: None   Collection Time: 06/25/19  5:13 AM   Specimen: Nasal Mucosa; Nasopharyngeal  Result Value Ref Range Status   MRSA by PCR NEGATIVE NEGATIVE Final    Comment:        The GeneXpert MRSA Assay (FDA approved for NASAL specimens only), is one component of a comprehensive MRSA colonization surveillance program. It is not intended to diagnose MRSA infection nor to guide or monitor treatment for MRSA infections. Performed at [facility], [place] [address], [place], [place] [address]   C Difficile Quick Screen w PCR reflex     Status: None   Collection Time: 06/26/19  5:30 PM   Specimen: Stool  Result Value Ref Range Status   C Diff antigen NEGATIVE NEGATIVE Final   C Diff toxin NEGATIVE NEGATIVE Final   C Diff interpretation No C. difficile detected.  Final    Comment: Performed at [facility], [place] [address], [place], [place] [address]     Studies: No results found.  Scheduled Meds: . atorvastatin  10 mg Oral Daily  . bisacodyl  10 mg Rectal Daily  . Chlorhexidine Gluconate Cloth  6 each Topical Daily  . furosemide  20 mg Oral BID  . ipratropium  0.5 mg Nebulization QID  . levalbuterol  0.63 mg Nebulization QID  . metoprolol succinate  25 mg Oral Daily  . rivaroxaban  20 mg Oral Q supper   . sodium bicarbonate  650 mg Oral TID  . sodium chloride flush  3 mL Intravenous Q12H    Continuous Infusions: .  ceFAZolin (ANCEF) IV 2 g (06/28/19 3007)     Flora Lipps, MD  [facility] 06/28/2019

## 2019-06-29 LAB — AEROBIC/ANAEROBIC CULTURE W GRAM STAIN (SURGICAL/DEEP WOUND)

## 2019-06-29 LAB — CBC
HCT: 32 % — ABNORMAL LOW (ref 39.0–52.0)
Hemoglobin: 10.9 g/dL — ABNORMAL LOW (ref 13.0–17.0)
MCH: 31.6 pg (ref 26.0–34.0)
MCHC: 34.1 g/dL (ref 30.0–36.0)
MCV: 92.8 fL (ref 80.0–100.0)
Platelets: 168 10*3/uL (ref 150–400)
RBC: 3.45 MIL/uL — ABNORMAL LOW (ref 4.22–5.81)
RDW: 14.3 % (ref 11.5–15.5)
WBC: 8.3 10*3/uL (ref 4.0–10.5)
nRBC: 0.4 % — ABNORMAL HIGH (ref 0.0–0.2)

## 2019-06-29 LAB — BASIC METABOLIC PANEL
Anion gap: 9 (ref 5–15)
BUN: 24 mg/dL — ABNORMAL HIGH (ref 8–23)
CO2: 26 mmol/L (ref 22–32)
Calcium: 7.8 mg/dL — ABNORMAL LOW (ref 8.9–10.3)
Chloride: 106 mmol/L (ref 98–111)
Creatinine, Ser: 1.25 mg/dL — ABNORMAL HIGH (ref 0.61–1.24)
GFR calc Af Amer: >60 mL/min (ref >60)
GFR calc non Af Amer: 54 mL/min — ABNORMAL LOW (ref >60)
Glucose, Bld: 132 mg/dL — ABNORMAL HIGH (ref 70–99)
Potassium: 3.4 mmol/L — ABNORMAL LOW (ref 3.5–5.1)
Sodium: 141 mmol/L (ref 135–145)

## 2019-06-29 LAB — MAGNESIUM: Magnesium: 1.7 mg/dL (ref 1.7–2.4)

## 2019-06-29 MED ORDER — POTASSIUM CHLORIDE CRYS ER 20 MEQ PO TBCR
40.0000 meq | EXTENDED_RELEASE_TABLET | Freq: Once | ORAL | Status: AC
  Administered 2019-06-29: 40 meq via ORAL
  Filled 2019-06-29: qty 2

## 2019-06-29 MED ORDER — MAGNESIUM OXIDE 400 (241.3 MG) MG PO TABS
400.0000 mg | ORAL_TABLET | Freq: Three times a day (TID) | ORAL | Status: DC
  Administered 2019-06-29 – 2019-07-02 (×10): 400 mg via ORAL
  Filled 2019-06-29 (×10): qty 1

## 2019-06-29 NOTE — Plan of Care (Signed)
  Problem: Education: Goal: Knowledge of General Education information will improve Description: Including pain rating scale, medication(s)/side effects and non-pharmacologic comfort measures Outcome: Progressing   Problem: Clinical Measurements: Goal: Ability to maintain clinical measurements within normal limits will improve Outcome: Progressing Goal: Will remain free from infection Outcome: Progressing Goal: Diagnostic test results will improve Outcome: Progressing Goal: Respiratory complications will improve Outcome: Progressing   Problem: Elimination: Goal: Will not experience complications related to bowel motility Outcome: Progressing Goal: Will not experience complications related to urinary retention Outcome: Progressing   Problem: Safety: Goal: Ability to remain free from injury will improve Outcome: Progressing   Problem: Fluid Volume: Goal: Hemodynamic stability will improve Outcome: Progressing   Problem: Clinical Measurements: Goal: Diagnostic test results will improve Outcome: Progressing Goal: Signs and symptoms of infection will decrease Outcome: Progressing   Problem: Respiratory: Goal: Ability to maintain adequate ventilation will improve Outcome: Progressing

## 2019-06-29 NOTE — Progress Notes (Signed)
PROGRESS NOTE    Patrick Bishop  TYO:[id number] DOB: [birth year] DOA: 06/24/2019 PCP: Lujean Amel, MD    Brief Narrative:  78 year old male who presented with abdominal pain.  He does have significant past medical history for atrial fibrillation, obesity, pulmonary hypertension, dyslipidemia, hypertension, peripheral vascular disease and chronic diastolic heart failure.  He reported continued suprapubic abdominal pain, questionable fevers positive chills, no dysuria or increased urinary frequency.  On his initial physical examination he was ill looking appearing, in pain, his oxygen saturation was 88% on room air, blood pressure 99/51, pulse rate 88, respiratory rate 32, his lungs were clear to auscultation bilaterally, increased respiratory effort, heart S1-S2 present, tachycardic, abdomen soft, 3+ lower extremity edema.  Sodium 141, potassium 3.1, chloride 110, bicarb 17, glucose 122, BUN 28, creatinine 2.26, troponin high sensitivity 371, lactic acid 5.6, white count 9.4, hemoglobin 13.0, hematocrit 39.6, platelets 149.  SARS COVID-19 was negative.  Urinalysis 6-10 white cells 11-20 red cells, positive nitrates.  Small leukocytes.  CT of the abdomen with a 1 cm calculus in the left ureteropelvic junction moderate proximal left hydronephrosis and extensive perinephric stranding indicating of obstruction.  Patient was admitted to the hospital with a working diagnosis of septic shock due to urinary tract infection.   Urology was consulted, recommendations for IR placement of left-sided percutaneous nephrostomy tube. Blood cultures positive for proteus.   Patient had black stools, GI consultation with recommendations to hold anticoagulation with rivaroxaban for now.    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 Septic shock due to left pyelonephritis with gram negative (Proteus) bacteremia. Present on admission. Patient is no sp nephrostomy tube in the left. WBC at 8,3. Patient has been afebrile wit no significant left flank pain. Plan to continue with antibiotic therapy for 2 weeks before stone intervention. Continue with cefazolin IV.   Troponin elevation due to septic shock, no acute coronary syndrome.  2. Acute gastrointestinal bleed, possible lower. Hgb has been stable at 10.9 with no current melena or hematochezia, continue to hold on rivaroxaban for now. Continue on pantoprazole. Patient is tolerating po well.  3. Chronic atrial fibrillation with RVR. Continue with rate control with metoprolol, continue to hold on anticoagulation. Will discontinue telemetry for now.   4. Acute on chronica diastolic heart failure.  Echocardiogram with preserved LV systolic function 60 to 74%. Today with euvolemia, will continue oral diuresis with furosemide.  5. HTN. Blood pressure systolic 99 to 142 mmHg, will continue blood pressure control with metoprolol. Hold amlodipine to prevent hypotension.   6. AKI with hypomagnesemia and hypokalemia. Renal function with serum cr down to 1,25 from 1,29, K at 3,4 and serum bicarbonate at 26. Will continue diuresis with po furosemide and follow on renal panel in am.   7. Obesity with dyslipidemia. Calculated BMI is 46,7. Continue with statin therapy.  DVT prophylaxis: heparin   Code Status: full Family Communication: no family at the bedside  Disposition Plan/ discharge barriers: pending clinical improvement.   Body mass index is 46.74  kg/m. Malnutrition Type:       Malnutrition Characteristics:      Nutrition Interventions:     RN Pressure Injury Documentation:     Consultants:     Procedures:     Antimicrobials:   Cefazolin.     Subjective: Patient is feeling better, no dyspnea or chest pain, no nausea or vomiting,. No bowel movement today.   Objective: Vitals:   06/29/19 0256 06/29/19 0547 06/29/19 0743 06/29/19 0800  BP: 123/70   134/74  Pulse: 89   94  Resp: (!) 34   (!) 30  Temp: 98.1 F (36.7 C)   98.5 F (36.9 C)  TempSrc: Oral   Oral  SpO2: 95%  94% 94%  Weight:  (!) 160.7 kg    Height:        Intake/Output Summary (Last 24 hours) at 06/29/2019 1042 Last data filed at 06/29/2019 0800 Gross per 24 hour  Intake 1177 ml  Output 1975 ml  Net -798 ml   Filed Weights   06/26/19 0453 06/28/19 0309 06/29/19 0547  Weight: (!) 165.3 kg (!) 160.2 kg (!) 160.7 kg    Examination:   General: Not in pain or dyspnea, deconditioned  Neurology: Awake and alert, non focal  E ENT: mild pallor, no icterus, oral mucosa moist Cardiovascular: No JVD. S1-S2 present, rhythmic, no gallops, rubs, or murmurs. ++/ +++ non pitting lower extremity edema. Pulmonary: positive breath sounds bilaterally, adequate air movement, no wheezing, rhonchi or rales. Gastrointestinal. Abdomen mil distended with no organomegaly, non tender, no rebound or guarding Skin. No rashes Musculoskeletal: no joint deformities     Data Reviewed: I have personally reviewed following labs and imaging studies  CBC: Recent Labs  Lab 06/24/19 1158 06/25/19 0816 06/26/19 0346 06/27/19 0156 06/28/19 1824 06/29/19 0328  WBC 9.4 12.1* 12.7* 10.3  --  8.3  NEUTROABS 8.5*  --   --  8.7*  --   --   HGB 13.0 10.9* 11.9* 11.9* 11.7* 10.9*  HCT 39.6 32.0* 35.3* 35.8* 34.4* 32.0*  MCV 95.0 93.8 92.7 94.0  --  92.8  PLT 149* 144* 140* 140*  --  619   Basic Metabolic Panel: Recent Labs  Lab 06/24/19 1158 06/25/19 0357 06/26/19 0346 06/27/19 0156  06/29/19 0328  NA 141 138 142 147* 141  K 3.1* 4.9 4.2 3.5 3.4*  CL 110 111 107 113* 106  CO2 17* 14* 24 28 26   GLUCOSE 122* 97 147* 113* 132*  BUN 28* 35* 39* 42* 24*  CREATININE 2.26* 1.77* 1.58* 1.29* 1.25*  CALCIUM 8.6* 7.4* 7.8* 8.1* 7.8*  MG  --  2.0 2.1 2.2 1.7  PHOS  --   --   --  2.2*  --    GFR: Estimated Creatinine Clearance: 76 mL/min (A) (by C-G formula based on SCr of 1.25 mg/dL (H)). Liver Function Tests: Recent Labs  Lab 06/24/19 1158 06/26/19 0346 06/27/19 0156  AST 41 85*  --   ALT 19 30  --   ALKPHOS 59 56  --   BILITOT 2.0* 1.6*  --   PROT 6.7 6.2*  --   ALBUMIN 3.4* 2.8* 2.4*   Recent Labs  Lab 06/24/19 1158  LIPASE 20   No results for input(s): AMMONIA in the last 168 hours. Coagulation Profile: Recent Labs  Lab 06/24/19 1158  INR 2.2*   Cardiac Enzymes: No results for input(s): CKTOTAL, CKMB, CKMBINDEX, TROPONINI in the last 168 hours. BNP (last 3 results) No results for  input(s): PROBNP in the last 8760 hours. HbA1C: No results for input(s): HGBA1C in the last 72 hours. CBG: Recent Labs  Lab 06/25/19 2149  GLUCAP 157*   Lipid Profile: No results for input(s): CHOL, HDL, LDLCALC, TRIG, CHOLHDL, LDLDIRECT in the last 72 hours. Thyroid Function Tests: No results for input(s): TSH, T4TOTAL, FREET4, T3FREE, THYROIDAB in the last 72 hours. Anemia Panel: No results for input(s): VITAMINB12, FOLATE, FERRITIN, TIBC, IRON, RETICCTPCT in the last 72 hours.    Radiology Studies: I have reviewed all of the imaging during this hospital visit personally     Scheduled Meds: . amLODipine  10 mg Oral Daily  . atorvastatin  10 mg Oral Daily  . bisacodyl  10 mg Rectal Daily  . Chlorhexidine Gluconate Cloth  6 each Topical Daily  . furosemide  20 mg Oral BID  . ipratropium  0.5 mg Nebulization QID  . levalbuterol  0.63 mg Nebulization QID  . metoprolol succinate  25 mg Oral Daily  . pantoprazole  40 mg Oral Daily  . rivaroxaban  20 mg  Oral Q supper  . sodium chloride flush  3 mL Intravenous Q12H   Continuous Infusions: .  ceFAZolin (ANCEF) IV 2 g (06/29/19 0559)     LOS: 5 days        Mauricio Gerome Apley, MD

## 2019-06-29 NOTE — Progress Notes (Signed)
Patient refused CPAP tonight, states the mask is too bluky and he can sleep better without it.

## 2019-06-30 DIAGNOSIS — R710 Precipitous drop in hematocrit: Secondary | ICD-10-CM

## 2019-06-30 DIAGNOSIS — R195 Other fecal abnormalities: Secondary | ICD-10-CM

## 2019-06-30 LAB — BASIC METABOLIC PANEL
Anion gap: 10 (ref 5–15)
BUN: 20 mg/dL (ref 8–23)
CO2: 25 mmol/L (ref 22–32)
Calcium: 7.8 mg/dL — ABNORMAL LOW (ref 8.9–10.3)
Chloride: 106 mmol/L (ref 98–111)
Creatinine, Ser: 1.2 mg/dL (ref 0.61–1.24)
GFR calc Af Amer: >60 mL/min (ref >60)
GFR calc non Af Amer: 57 mL/min — ABNORMAL LOW (ref >60)
Glucose, Bld: 134 mg/dL — ABNORMAL HIGH (ref 70–99)
Potassium: 3.8 mmol/L (ref 3.5–5.1)
Sodium: 141 mmol/L (ref 135–145)

## 2019-06-30 LAB — PREPARE RBC (CROSSMATCH)

## 2019-06-30 MED ORDER — SODIUM CHLORIDE 0.9% IV SOLUTION: Freq: Once | INTRAVENOUS | Status: DC

## 2019-06-30 MED ORDER — LEVALBUTEROL HCL 0.63 MG/3ML IN NEBU
0.6300 mg | INHALATION_SOLUTION | Freq: Three times a day (TID) | RESPIRATORY_TRACT | Status: DC
  Administered 2019-06-30 – 2019-07-02 (×8): 0.63 mg via RESPIRATORY_TRACT
  Filled 2019-06-30 (×8): qty 3

## 2019-06-30 MED ORDER — BISACODYL 5 MG PO TBEC: 5.0000 mg | DELAYED_RELEASE_TABLET | Freq: Every day | ORAL | Status: DC | PRN

## 2019-06-30 MED ORDER — CEPHALEXIN 500 MG PO CAPS
500.0000 mg | ORAL_CAPSULE | Freq: Two times a day (BID) | ORAL | Status: DC
  Administered 2019-06-30 – 2019-07-02 (×5): 500 mg via ORAL
  Filled 2019-06-30 (×5): qty 1

## 2019-06-30 MED ORDER — IPRATROPIUM BROMIDE 0.02 % IN SOLN
0.5000 mg | Freq: Three times a day (TID) | RESPIRATORY_TRACT | Status: DC
  Administered 2019-06-30 – 2019-07-02 (×8): 0.5 mg via RESPIRATORY_TRACT
  Filled 2019-06-30 (×8): qty 2.5

## 2019-06-30 NOTE — Progress Notes (Signed)
Pt states he does not want to wear CPAP tonight. He cannot sleep with that mask. Instructed pt he can ask his daughter to bring home machine.

## 2019-06-30 NOTE — Plan of Care (Signed)
  Problem: Education: Goal: Knowledge of General Education information will improve Description: Including pain rating scale, medication(s)/side effects and non-pharmacologic comfort measures Outcome: Progressing   Problem: Clinical Measurements: Goal: Ability to maintain clinical measurements within normal limits will improve Outcome: Progressing Goal: Will remain free from infection Outcome: Progressing Goal: Diagnostic test results will improve Outcome: Progressing Goal: Respiratory complications will improve Outcome: Progressing   Problem: Elimination: Goal: Will not experience complications related to bowel motility Outcome: Progressing Goal: Will not experience complications related to urinary retention Outcome: Progressing   Problem: Safety: Goal: Ability to remain free from injury will improve Outcome: Progressing   Problem: Fluid Volume: Goal: Hemodynamic stability will improve Outcome: Progressing   Problem: Clinical Measurements: Goal: Diagnostic test results will improve Outcome: Progressing Goal: Signs and symptoms of infection will decrease Outcome: Progressing   Problem: Respiratory: Goal: Ability to maintain adequate ventilation will improve Outcome: Progressing

## 2019-06-30 NOTE — Progress Notes (Signed)
PROGRESS NOTE    Tamari Busic  WOE:[id number] DOB: 1941/04/25 DOA: 06/24/2019 PCP: Lujean Amel, MD    Brief Narrative:  78 year old male who presented with abdominal pain.  He does have significant past medical history for atrial fibrillation, obesity, pulmonary hypertension, dyslipidemia, hypertension, peripheral vascular disease and chronic diastolic heart failure.  He reported continued suprapubic abdominal pain, questionable fevers but positive chills, no dysuria or increased urinary frequency.  On his initial physical examination he was ill looking appearing, in pain, his oxygen saturation was 88% on room air, blood pressure 99/51, pulse rate 88, respiratory rate 32, his lungs were clear to auscultation bilaterally, increased respiratory effort, heart S1-S2 present, tachycardic, abdomen soft, 3+ lower extremity edema.  Sodium 141, potassium 3.1, chloride 110, bicarb 17, glucose 122, BUN 28, creatinine 2.26, troponin high sensitivity 371, lactic acid 5.6, white count 9.4, hemoglobin 13.0, hematocrit 39.6, platelets 149.  SARS COVID-19 was negative.  Urinalysis 6-10 white cells 11-20 red cells, positive nitrates.  Small leukocytes.  CT of the abdomen with a 1 cm calculus in the left ureteropelvic junction moderate proximal left hydronephrosis and extensive perinephric stranding indicating of obstruction.  Patient was admitted to the hospital with a working diagnosis of septic shock due to urinary tract infection.   Urology was consulted, recommendations for IR placement of left-sided percutaneous nephrostomy tube. Blood cultures positive for Proteus.   Patient had black stools, GI consultation with recommendations to hold anticoagulation with rivaroxaban for now.   No further signs of bleeding. Patient very weak and deconditioned, pending placement SNF.    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 Septic shock due to left pyelonephritis with gram negative (Proteus) bacteremia. Present on admission. sp nephrostomy tube in the left. Patient has remained afebrile, no leukocytosis,  Proteus is sensitive to cephalosporins, wil change to cephalexin 500 q12 H, plan to continue for total of 14 days of antibiotic therapy per urology recommendations.   Troponin elevation due to septic shock, no acute coronary syndrome.  2. Acute gastrointestinal bleed, possible lower. No signs of bleeding actively, will check cell count in am, if no significant drop will consider resuming rivaroxaban, will follow with GI recommendations. Continue with pantoprazole.   3. Chronic atrial fibrillation with RVR. On metoprolol for rate control, for now anticoagulation is on hold. Off telemetry monitoring.  4. Acute on chronica diastolic heart failure.  Echocardiogram with preserved LV systolic function 60 to 00%. Likely chronic edema lower extremities, venous insufficiency,  will continue with furosemide po.   5. HTN. Blood pressure today 111/60, will continue metoprolol for blood pressure control. Furosemide to keep negative fluid balance.   6. AKI with hypomagnesemia and hypokalemia. Stable renal function with serum cr down to 1,20,  K at 3,8 and serum bicarbonate at 25. Continue furosemide. Follow with renal panel in am.   7. Obesity with dyslipidemia. BMI is 46,7. On statin therapy.  DVT prophylaxis: heparin   Code Status: full Family Communication: no family at the bedside  Disposition Plan/ discharge barriers: pending SNF  placement.   Body mass index is 46.74 kg/m. Malnutrition Type:      Malnutrition Characteristics:      Nutrition Interventions:     RN Pressure Injury Documentation:     Consultants:   Urology   IR   PCCM   Procedures:   Left nephrostomy tube   Antimicrobials:   Cephalexin.     Subjective: Patient is feeling well, continue to be very weak and deconditioned, no dyspnea, no melena or hematochezia, no chest pain.   Objective: Vitals:   06/29/19 2147 06/30/19 0724 06/30/19 0735 06/30/19 1129  BP:  121/71  111/60  Pulse:    90  Resp:  16  16  Temp:  97.9 F (36.6 C)  98.1 F (36.7 C)  TempSrc:  Oral  Oral  SpO2: 95% 93% 93% 93%  Weight:      Height:        Intake/Output Summary (Last 24 hours) at 06/30/2019 1235 Last data filed at 06/30/2019 0730 Gross per 24 hour  Intake 1145 ml  Output 1600 ml  Net -455 ml   Filed Weights   06/26/19 0453 06/28/19 0309 06/29/19 0547  Weight: (!) 165.3 kg (!) 160.2 kg (!) 160.7 kg    Examination:   General: Not in pain or dyspnea, deconditioned  Neurology: Awake and alert, non focal  E ENT: mild pallor, no icterus, oral mucosa moist Cardiovascular: No JVD. S1-S2 present, rhythmic, no gallops, rubs, or murmurs. +++ non pitting lower extremity edema. Pulmonary: positive breath sounds bilaterally, adequate air movement, no wheezing, rhonchi or rales. Gastrointestinal. Abdomen with no organomegaly, non tender, no rebound or guarding Skin. No rashes Musculoskeletal: no joint deformities     Data Reviewed: I have personally reviewed following labs and imaging studies  CBC: Recent Labs  Lab 06/24/19 1158 06/25/19 0816 06/26/19 0346 06/27/19 0156 06/28/19 1824 06/29/19 0328  WBC 9.4 12.1* 12.7* 10.3  --  8.3  NEUTROABS 8.5*  --   --  8.7*  --   --   HGB 13.0 10.9* 11.9* 11.9* 11.7* 10.9*  HCT 39.6 32.0* 35.3* 35.8* 34.4* 32.0*  MCV 95.0 93.8 92.7 94.0  --  92.8  PLT 149* 144* 140* 140*  --  846    Basic Metabolic Panel: Recent Labs  Lab 06/25/19 0357 06/26/19 0346 06/27/19 0156 06/29/19 0328 06/30/19 0302  NA 138 142 147* 141 141  K 4.9 4.2 3.5 3.4* 3.8  CL 111 107 113* 106 106  CO2 14* 24 28 26 25   GLUCOSE 97 147* 113* 132* 134*  BUN 35* 39* 42* 24* 20  CREATININE 1.77* 1.58* 1.29* 1.25* 1.20  CALCIUM 7.4* 7.8* 8.1* 7.8* 7.8*  MG 2.0 2.1 2.2 1.7  --   PHOS  --   --  2.2*  --   --    GFR: Estimated Creatinine Clearance: 79.2 mL/min (by C-G formula based on SCr of 1.2 mg/dL). Liver Function Tests: Recent Labs  Lab 06/24/19 1158 06/26/19 0346 06/27/19 0156  AST 41 85*  --   ALT 19 30  --   ALKPHOS 59 56  --   BILITOT 2.0* 1.6*  --   PROT 6.7 6.2*  --   ALBUMIN 3.4* 2.8* 2.4*   Recent Labs  Lab 06/24/19 1158  LIPASE 20   No results for input(s): AMMONIA in the last 168 hours. Coagulation Profile: Recent Labs  Lab 06/24/19 1158  INR 2.2*   Cardiac Enzymes: No results  for input(s): CKTOTAL, CKMB, CKMBINDEX, TROPONINI in the last 168 hours. BNP (last 3 results) No results for input(s): PROBNP in the last 8760 hours. HbA1C: No results for input(s): HGBA1C in the last 72 hours. CBG: Recent Labs  Lab 06/25/19 2149  GLUCAP 157*   Lipid Profile: No results for input(s): CHOL, HDL, LDLCALC, TRIG, CHOLHDL, LDLDIRECT in the last 72 hours. Thyroid Function Tests: No results for input(s): TSH, T4TOTAL, FREET4, T3FREE, THYROIDAB in the last 72 hours. Anemia Panel: No results for input(s): VITAMINB12, FOLATE, FERRITIN, TIBC, IRON, RETICCTPCT in the last 72 hours.    Radiology Studies: I have reviewed all of the imaging during this hospital visit personally     Scheduled Meds: . atorvastatin  10 mg Oral Daily  . bisacodyl  10 mg Rectal Daily  . Chlorhexidine Gluconate Cloth  6 each Topical Daily  . furosemide  20 mg Oral BID  . ipratropium  0.5 mg Nebulization TID  . levalbuterol  0.63 mg Nebulization TID  . magnesium oxide  400 mg Oral TID  .  metoprolol succinate  25 mg Oral Daily  . pantoprazole  40 mg Oral Daily  . sodium chloride flush  3 mL Intravenous Q12H   Continuous Infusions: .  ceFAZolin (ANCEF) IV 2 g (06/30/19 0600)     LOS: 6 days         Gerome Apley, MD

## 2019-06-30 NOTE — Progress Notes (Addendum)
Patient ID: Patrick Bishop, male   DOB: 04-Aug-1941, 78 y.o.   MRN: [id number]    Progress Note   Subjective  Day # 6 CC: Heme positive stool in setting of Xarelto, mild anemia  Last hemoglobin yesterday 10.9-stable  Up in chair, had bowel movement morning small dark, first BM in a few days No complaints of abdominal discomfort eating without difficulty.  Relates he may be discharged tomorrow, to nursing home for rehab    Objective   Vital signs in last 24 hours: Temp:  [97.9 F (36.6 C)-98.6 F (37 C)] 98.1 F (36.7 C) (10/04 1129) Pulse Rate:  [76-90] 90 (10/04 1129) Resp:  [12-22] 16 (10/04 1129) BP: (98-121)/(56-71) 111/60 (10/04 1129) SpO2:  [91 %-100 %] 93 % (10/04 1129) FiO2 (%):  [28 %] 28 % (10/04 0735) Last BM Date: 06/27/19 General:    Obese elderly white male in NAD Heart:  Regular rate and rhythm; no murmurs Lungs: Respirations even and unlabored, lungs CTA bilaterally Abdomen:  Soft, morbidly obese, nontender, and nondistended. Normal bowel sounds. Extremities: Severe bilateral lower extremity stasis changes/edema Neurologic:  Alert and oriented,  grossly normal neurologically. Psych:  Cooperative. Normal mood and affect.  Intake/Output from previous day: 10/03 0701 - 10/04 0700 In: 1505 [P.O.:1505] Out: 1950 [Urine:1950] Intake/Output this shift: Total I/O In: 240 [P.O.:240] Out: 50 [Urine:50]   CBC Latest Ref Rng & Units 06/29/2019 06/28/2019 06/27/2019  WBC 4.0 - 10.5 K/uL 8.3 - 10.3  Hemoglobin 13.0 - 17.0 g/dL 10.9(L) 11.7(L) 11.9(L)  Hematocrit 39.0 - 52.0 % 32.0(L) 34.4(L) 35.8(L)  Platelets 150 - 400 K/uL 168 - 140(L)      Assessment / Plan:    #77 78 year old male with dark heme positive stool x4 days episode of coffee-ground emesis on admission, in setting of Xarelto  Xarelto has been on hold over the past 2 days, no active bleeding and hemoglobin stable  Suspect acute gastropathy or esophagitis  #2 sepsis secondary to pyelonephritis and  left UPJ stone-status post percutaneous nephrostomy 06/24/2019  Acute kidney injury resolving  #3 chronic congestive heart failure, pulmonary hypertension #4 peripheral vascular disease status post stents #5 chronic lower extremity stasis/lymphedema severe #6 atrial fibrillation  #7 coronary artery disease  #8 history of colon polyps-prior colonoscopies done in [place] last procedure 2015 1 small polyp removed, do not have path report  Plan; continue oral PPI Check hemoglobin today Not planning endoscopic evaluation. resatart Xarelto tomorrow unless signs of bleeding - drift down in Hgb w/o bleeding would not stop from resuming  GI will sign off, available if needed    LOS: 6 days   Amy EsterwoodPA-C  06/30/2019, 12:17 PM   [employer] Attending   I have taken an interval history, reviewed the chart and examined the patient. I agree with the Advanced Practitioner's note, impression and recommendations.   Gatha Mayer, MD, [facility] 06/30/2019 4:36 PM Pager [phone]

## 2019-06-30 NOTE — Progress Notes (Signed)
Pt was stable whole day, did back and forth to chair and bed and ambulated inside the room but refused to ambulate in a hallway, used oxygen on and off, daughter was in bed side and updated, denies any pain, will continue to monitor the patient  Palma Holter, RN

## 2019-07-01 LAB — BASIC METABOLIC PANEL
Anion gap: 8 (ref 5–15)
BUN: 17 mg/dL (ref 8–23)
CO2: 24 mmol/L (ref 22–32)
Calcium: 8.2 mg/dL — ABNORMAL LOW (ref 8.9–10.3)
Chloride: 107 mmol/L (ref 98–111)
Creatinine, Ser: 1.11 mg/dL (ref 0.61–1.24)
GFR calc Af Amer: >60 mL/min (ref >60)
GFR calc non Af Amer: >60 mL/min (ref >60)
Glucose, Bld: 130 mg/dL — ABNORMAL HIGH (ref 70–99)
Potassium: 3.8 mmol/L (ref 3.5–5.1)
Sodium: 139 mmol/L (ref 135–145)

## 2019-07-01 LAB — CBC WITH DIFFERENTIAL/PLATELET
Abs Immature Granulocytes: 0.2 10*3/uL — ABNORMAL HIGH (ref 0.00–0.07)
Basophils Absolute: 0 10*3/uL (ref 0.0–0.1)
Basophils Relative: 1 %
Eosinophils Absolute: 0.2 10*3/uL (ref 0.0–0.5)
Eosinophils Relative: 2 %
HCT: 33.9 % — ABNORMAL LOW (ref 39.0–52.0)
Hemoglobin: 11 g/dL — ABNORMAL LOW (ref 13.0–17.0)
Immature Granulocytes: 2 %
Lymphocytes Relative: 11 %
Lymphs Abs: 0.9 10*3/uL (ref 0.7–4.0)
MCH: 30.2 pg (ref 26.0–34.0)
MCHC: 32.4 g/dL (ref 30.0–36.0)
MCV: 93.1 fL (ref 80.0–100.0)
Monocytes Absolute: 0.9 10*3/uL (ref 0.1–1.0)
Monocytes Relative: 11 %
Neutro Abs: 6 10*3/uL (ref 1.7–7.7)
Neutrophils Relative %: 73 %
Platelets: 182 10*3/uL (ref 150–400)
RBC: 3.64 MIL/uL — ABNORMAL LOW (ref 4.22–5.81)
RDW: 14.5 % (ref 11.5–15.5)
WBC: 8.2 10*3/uL (ref 4.0–10.5)
nRBC: 0 % (ref 0.0–0.2)

## 2019-07-01 LAB — SARS CORONAVIRUS 2 (TAT 6-24 HRS): SARS Coronavirus 2: NEGATIVE

## 2019-07-01 MED ORDER — RIVAROXABAN 20 MG PO TABS
20.0000 mg | ORAL_TABLET | Freq: Every day | ORAL | Status: DC
  Administered 2019-07-01: 20 mg via ORAL
  Filled 2019-07-01: qty 1

## 2019-07-01 MED ORDER — CEPHALEXIN 500 MG PO CAPS: 500.0000 mg | ORAL_CAPSULE | Freq: Two times a day (BID) | ORAL | Status: DC

## 2019-07-01 MED ORDER — MAGNESIUM OXIDE 400 (241.3 MG) MG PO TABS: 400.0000 mg | ORAL_TABLET | Freq: Every day | ORAL | Status: DC

## 2019-07-01 MED ORDER — PANTOPRAZOLE SODIUM 40 MG PO TBEC: 40.0000 mg | DELAYED_RELEASE_TABLET | Freq: Every day | ORAL | Status: DC

## 2019-07-01 MED ORDER — METOPROLOL SUCCINATE ER 25 MG PO TB24: 25.0000 mg | ORAL_TABLET | Freq: Every day | ORAL | Status: DC

## 2019-07-01 MED ORDER — HYDROCODONE-ACETAMINOPHEN 5-325 MG PO TABS: 1.0000 | ORAL_TABLET | Freq: Two times a day (BID) | ORAL | 0 refills | Status: DC

## 2019-07-01 NOTE — TOC Initial Note (Signed)
Transition of Care [facility]) - Initial/Assessment Note    Patient Details  Name: Patrick Bishop MRN: [id number] Date of Birth: May 23, 1941  Transition of Care [facility]) CM/SW Contact:    Vinie Sill, [place] Phone Number: 07/01/2019, 12:57 PM  Clinical Narrative:                  CSW visited with patient at bedside long with his daughter,Lynda. CSW introduced self and explained role. CSW discussed PT recommendation of ST rehab before returning home. Patient states he resides at [facility]. Patient states he lives alone and is agreeable to ST rehab at [facility]. Patient's daughter expressed preference for SNF in the [place] area. Patient and family states no questions or concerns at this time.   Patient will need COVID test and results prior to discharge to SNF.  CSW will continue to follow and assist with discharge planning.   Thurmond Butts, MSW, [facility] Clinical Social Worker [phone]   Expected Discharge Plan: [place]     Patient Goals and CMS Choice        Expected Discharge Plan and Services Expected Discharge Plan: [place] In-house Referral: Clinical Social Work       Expected Discharge Date: 07/01/19                                    Prior Living Arrangements/Services   Lives with:: Self Patient language and need for interpreter reviewed:: Yes Do you feel safe going back to the place where you live?: No   patient willing to go to SNF  Need for Family Participation in Patient Care: Yes (Comment) Care giver support system in place?: Yes (comment)   Criminal Activity/Legal Involvement Pertinent to Current Situation/Hospitalization: No - Comment as needed  Activities of Daily Living Home Assistive Devices/Equipment: Cane (specify quad or straight), CPAP, Walker (specify type) ADL Screening (condition at time of admission) Patient's cognitive ability adequate to safely complete daily activities?:  No Is the patient deaf or have difficulty hearing?: No Does the patient have difficulty seeing, even when wearing glasses/contacts?: No Does the patient have difficulty concentrating, remembering, or making decisions?: No Patient able to express need for assistance with ADLs?: No Does the patient have difficulty dressing or bathing?: No Independently performs ADLs?: Yes (appropriate for developmental age) Does the patient have difficulty walking or climbing stairs?: Yes Weakness of Legs: Both Weakness of Arms/Hands: None  Permission Sought/Granted Permission sought to share information with : Family Supports Permission granted to share information with : Yes, Verbal Permission Granted  Share Information with NAME: Neena Rhymes  Permission granted to share info w AGENCY: SNFs  Permission granted to share info w Relationship: daughter  Permission granted to share info w Contact Information: [phone]  Emotional Assessment Appearance:: Appears stated age Attitude/Demeanor/Rapport: Engaged Affect (typically observed): Accepting, Appropriate, Pleasant Orientation: : Oriented to Self, Oriented to Place, Oriented to  Time, Oriented to Situation Alcohol / Substance Use: Not Applicable Psych Involvement: No (comment)  Admission diagnosis:  Hypokalemia [E87.6] Elevated troponin [R77.8] Atrial fibrillation with rapid ventricular response (HCC) [I48.91] Acute cystitis with hematuria [N30.01] AKI (acute kidney injury) ([place]) [N17.9] Urinary tract obstruction by kidney stone [N20.0, N13.8] Left ureteral stone [N20.1] Sepsis with acute renal failure without septic shock, due to unspecified organism, unspecified acute renal failure type ([place]) [A41.9, R65.20, N17.9] Patient Active Problem List   Diagnosis Date Noted  .  Heme + stool   . Decreased hemoglobin   . Septic shock due to urinary tract infection ([place]) 06/24/2019  . Hydronephrosis with obstructing calculus 06/24/2019  .  Cholelithiasis 06/24/2019  . Elevated troponin 06/24/2019  . Arthritis 07/12/2018  . Atrial fibrillation ([place]) 07/12/2018  . Chronic pain 07/12/2018  . Congestive heart failure ([place]) 07/12/2018  . Hearing loss 07/12/2018  . Lymphedema 07/12/2018  . Muscle weakness 07/12/2018  . Unsteady gait 07/12/2018  . Morbid obesity ([place]) 07/08/2018  . 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 Chronic diastolic HF (heart failure) ([place])   . Chronic venous insufficiency   . Essential hypertension   . Longstanding persistent atrial fibrillation: CHA2DSVasc = ~3 (age x 2, HTN)   . OSA on CPAP   . Pulmonary hypertension ([place])   . Hyperlipidemia with target LDL less than 100    PCP:  Lujean Amel, MD Pharmacy:   [facility] - [address] [place] [address] Phone: [phone] Fax: [phone]  [facility] [place], [place] [address] [place] [address] Phone: [phone] Fax: [place], [place] - [address] [place] [address] [place] [address] Phone: [phone] Fax: [phone]     Social Determinants of Health (SDOH) Interventions    Readmission Risk Interventions No flowsheet data found.

## 2019-07-01 NOTE — Plan of Care (Signed)
  Problem: Education: Goal: Knowledge of General Education information will improve Description: Including pain rating scale, medication(s)/side effects and non-pharmacologic comfort measures Outcome: Progressing   Problem: Clinical Measurements: Goal: Ability to maintain clinical measurements within normal limits will improve Outcome: Progressing Goal: Will remain free from infection Outcome: Progressing Goal: Diagnostic test results will improve Outcome: Progressing Goal: Respiratory complications will improve Outcome: Progressing   Problem: Elimination: Goal: Will not experience complications related to bowel motility Outcome: Progressing Goal: Will not experience complications related to urinary retention Outcome: Progressing   Problem: Safety: Goal: Ability to remain free from injury will improve Outcome: Progressing   Problem: Fluid Volume: Goal: Hemodynamic stability will improve Outcome: Progressing   Problem: Clinical Measurements: Goal: Diagnostic test results will improve Outcome: Progressing Goal: Signs and symptoms of infection will decrease Outcome: Progressing   Problem: Respiratory: Goal: Ability to maintain adequate ventilation will improve Outcome: Progressing

## 2019-07-01 NOTE — TOC Progression Note (Signed)
Transition of Care [facility]) - Progression Note    Patient Details  Name: Patrick Bishop MRN: [id number] Date of Birth: 10-13-1940  Transition of Care [facility]) CM/SW [place], [place] Phone Number: 07/01/2019, 3:39 PM  Clinical Narrative:     [facility]- decline patient  [place] - called and left voice message with admissions coordinator.  Thurmond Butts, MSW, [facility] Clinical Social Worker [phone]   Expected Discharge Plan: [place]    Expected Discharge Plan and Services Expected Discharge Plan: [place] In-house Referral: Clinical Social Work       Expected Discharge Date: 07/01/19                                     Social Determinants of Health (SDOH) Interventions    Readmission Risk Interventions No flowsheet data found.

## 2019-07-01 NOTE — Discharge Summary (Addendum)
Physician Discharge Summary  Patrick Bishop VHQ:[id number] DOB: 1940-11-15 DOA: 06/24/2019  PCP: Lujean Amel, MD  Admit date: 06/24/2019 Discharge date: 07/02/2019  Admitted From: home Discharge disposition: SNF   Recommendations for Outpatient Follow-Up:   1. Keflex until 10/13 2. Cbc 1 week 3. Outpatient urology follow up 4. 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  Diet recommendation: Low sodium, heart healthy  Wound care: None.  Code status: Full.   History of Present Illness:   78 year old male who presented with abdominal pain. He does have significant past medical history for atrial fibrillation, obesity, pulmonary hypertension, dyslipidemia, hypertension, peripheral vascular disease and chronic diastolic heart failure. He reported continued suprapubic abdominal pain, questionable fevers but positive chills, no dysuria or increased urinary frequency. On his initial physical examination he was ill looking appearing, in pain, his oxygen saturation was 88% on room air, blood pressure 99/51, pulse rate 88, respiratory rate 32, his lungs were clear to auscultation bilaterally, increased respiratory effort, heart S1-S2 present, tachycardic, abdomen soft, 3+ lower extremity edema. Sodium 141, potassium 3.1, chloride 110, bicarb 17, glucose 122, BUN 28, creatinine 2.26, troponin highsensitivity371, lactic acid 5.6, white  count 9.4, hemoglobin 13.0, hematocrit 39.6, platelets 149.SARS COVID-19 was negative. Urinalysis 6-10 white cells 11-20 red cells, positive nitrates. Small leukocytes. CT of the abdomen with a 1 cm calculus in the left ureteropelvic junction moderate proximal left hydronephrosis and extensive perinephric stranding indicating of obstruction.  Patient was admitted to the hospitalwith a workingdiagnosis of septic shock due to urinarytract infection.  Urology was consulted, recommendations for IR placement of left-sided percutaneous nephrostomy tube.Blood cultures positive for Proteus.   Patient had black stools, GI consultation with recommendations to hold anticoagulation with rivaroxaban for now.  No further signs of bleeding. Patient very weak and deconditioned, pending placement SNF.    Hospital Course by Problem:   1. Septic shock due to left pyelonephritis with gram negative (Proteus) bacteremia. Present on admission. sp nephrostomy tube in the left. Patient has remained afebrile, no leukocytosis,  Proteus is sensitive to cephalosporins, wil change to cephalexin 500 q12 H, plan to continue for total of 14 days of antibiotic therapy per urology recommendations.  Troponin elevation due to septic shock, no acute coronary syndrome.  2. Acute gastrointestinal bleed, possible lower. -PPI -GI consult appreciated -resolved  3. Chronic atrial fibrillation with RVR.  -On metoprolol for rate control -resume xarelto per GI as Hgb stable  4. Acute on chronica diastolic heart failure.  -Echocardiogram with preserved LV systolic function 60 to 41%. Likely chronic edema lower extremities, venous insufficiency,  will continue with furosemide po.   5. HTN. -continue metoprolol for blood pressure control. Furosemide to keep negative fluid balance.  -d/c norvasc  6. AKI with hypomagnesemia and hypokalemia -resolved  7. Obesity with dyslipidemia. Estimated body mass index is  30.57 kg/m as calculated from the following:   Height as  of this encounter: 6\' 1"  (1.854 m).   Weight as of this encounter: 105.1 kg.  8. OSA -CPAP QHS  Medical Consultants:   Urology GI    Discharge Exam:   Vitals:   07/02/19 0832 07/02/19 1119  BP:  (!) 118/56  Pulse:  71  Resp:  20  Temp:  98.3 F (36.8 C)  SpO2: 94% 90%   Vitals:   07/02/19 0616 07/02/19 0814 07/02/19 0832 07/02/19 1119  BP:  113/66  (!) 118/56  Pulse:  72  71  Resp:    20  Temp:  97.6 F (36.4 C)  98.3 F (36.8 C)  TempSrc:  Oral  Oral  SpO2:  91% 94% 90%  Weight: 105.1 kg     Height:        General exam: Appears calm and comfortable.  The results of significant diagnostics from this hospitalization (including imaging, microbiology, ancillary and laboratory) are listed below for reference.     Procedures and Diagnostic Studies:   Ct Abdomen Pelvis Wo Contrast  Result Date: 06/24/2019 CLINICAL DATA:  78 year old male with recent history of abdominal pain. EXAM: CT CHEST, ABDOMEN AND PELVIS WITHOUT CONTRAST TECHNIQUE: Multidetector CT imaging of the chest, abdomen and pelvis was performed following the standard protocol without IV contrast. COMPARISON:  No priors. FINDINGS: CT CHEST FINDINGS Cardiovascular: Heart size is mildly enlarged. There is no significant pericardial fluid, thickening or pericardial calcification. There is aortic atherosclerosis, as well as atherosclerosis of the great vessels of the mediastinum and the coronary arteries, including calcified atherosclerotic plaque in the left main, left anterior descending, left circumflex and right coronary arteries. Mediastinum/Nodes: No pathologically enlarged mediastinal or hilar lymph nodes. Please note that accurate exclusion of hilar adenopathy is limited on noncontrast CT scans. Esophagus is unremarkable in appearance. No axillary lymphadenopathy. Lungs/Pleura: Areas of scarring or subsegmental atelectasis in the lower lobes of the  lungs bilaterally. A few scattered small pulmonary nodules are noted in the lungs, largest of which is in the right middle lobe (axial image 84 of series 4) measuring 5 mm. No other larger more suspicious appearing pulmonary nodules or masses are noted. No acute consolidative airspace disease. No pleural effusions. Musculoskeletal: Large fatty attenuation lesion in the left chest wall extending into the left axillary region, incompletely imaged, but measuring at least 22.5 x 14.8 cm. There are no aggressive appearing lytic or blastic lesions noted in the visualized portions of the skeleton. CT ABDOMEN PELVIS FINDINGS Hepatobiliary: Diffuse low attenuation throughout the hepatic parenchyma, indicative of hepatic steatosis. No definite suspicious cystic or solid hepatic lesions are confidently identified on today's noncontrast CT examination. Intermediate attenuation lying dependently in the gallbladder, likely to represent tiny noncalcified gallstones. No findings to suggest an acute cholecystitis at this time. Pancreas: No pancreatic mass. No pancreatic ductal dilatation. No pancreatic or peripancreatic fluid collections or inflammatory changes. Spleen: Unremarkable. Adrenals/Urinary Tract: Nonobstructive calculi in the lower pole collecting system of left kidney measuring up to 4 mm. In addition, there is a 1 cm calculus at the left ureteropelvic junction associated with mild-to-moderate proximal hydronephrosis and extensive perinephric stranding indicating obstruction at this time. No additional calculi are noted in the collecting system of the right kidney, along the course of the right ureter or within the lumen of the urinary bladder. No right hydroureteronephrosis. Unenhanced appearance of the kidneys is otherwise unremarkable. Urinary bladder is nearly decompressed, but otherwise unremarkable in appearance. Bilateral adrenal glands are normal in appearance. Stomach/Bowel: Unenhanced appearance of the  stomach  is normal. No pathologic dilatation of small bowel or colon. The appendix is not confidently identified and may be surgically absent. Regardless, there are no inflammatory changes noted adjacent to the cecum to suggest the presence of an acute appendicitis at this time. 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 No lymphadenopathy noted in the abdomen or pelvis. Reproductive: Prostate gland and seminal vesicles are unremarkable in appearance. Other: No significant volume of ascites.  No pneumoperitoneum. Musculoskeletal: There are no aggressive appearing lytic or blastic lesions noted in the visualized portions of the skeleton. IMPRESSION: 1. 1 cm calculus at the left ureteropelvic junction with mild to moderate proximal left hydronephrosis and extensive perinephric stranding indicative of obstruction. 2. No acute findings noted in the thorax. 3. 2 additional nonobstructive calculi in the lower pole collecting system of left kidney measuring up to 4 mm. 4. Aortic atherosclerosis, in addition to left main and 3 vessel coronary artery disease. Assessment for potential risk factor modification, dietary therapy or pharmacologic therapy may be warranted, if clinically indicated. 5. Probable cholelithiasis without evidence of acute cholecystitis at this time. 6. Mild cardiomegaly. 7. 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 Electronically Signed   By: Vinnie Langton M.D.   On: 06/24/2019 15:16   Ct Chest Wo Contrast  Result Date: 06/24/2019 CLINICAL DATA:  78 year old male with recent history of abdominal pain. EXAM: CT CHEST, ABDOMEN AND PELVIS WITHOUT CONTRAST TECHNIQUE: Multidetector CT imaging of the chest, abdomen and pelvis was performed following the  standard protocol without IV contrast. COMPARISON:  No priors. FINDINGS: CT CHEST FINDINGS Cardiovascular: Heart size is mildly enlarged. There is no significant pericardial fluid, thickening or pericardial calcification. There is aortic atherosclerosis, as well as atherosclerosis of the great vessels of the mediastinum and the coronary arteries, including calcified atherosclerotic plaque in the left main, left anterior descending, left circumflex and right coronary arteries. Mediastinum/Nodes: No pathologically enlarged mediastinal or hilar lymph nodes. Please note that accurate exclusion of hilar adenopathy is limited on noncontrast CT scans. Esophagus is unremarkable in appearance. No axillary lymphadenopathy. Lungs/Pleura: Areas of scarring or subsegmental atelectasis in the lower lobes of the lungs bilaterally. A few scattered small pulmonary nodules are noted in the lungs, largest of which is in the right middle lobe (axial image 84 of series 4) measuring 5 mm. No other larger more suspicious appearing pulmonary nodules or masses are noted. No acute consolidative airspace disease. No pleural effusions. Musculoskeletal: Large fatty attenuation lesion in the left chest wall extending into the left axillary region, incompletely imaged, but measuring at least 22.5 x 14.8 cm. There are no aggressive appearing lytic or blastic lesions noted in the visualized portions of the skeleton. CT ABDOMEN PELVIS FINDINGS Hepatobiliary: Diffuse low attenuation throughout the hepatic parenchyma, indicative of hepatic steatosis. No definite suspicious cystic or solid hepatic lesions are confidently identified on today's noncontrast CT examination. Intermediate attenuation lying dependently in the gallbladder, likely to represent tiny noncalcified gallstones. No findings to suggest an acute cholecystitis at this time. Pancreas: No pancreatic mass. No pancreatic ductal dilatation. No pancreatic or peripancreatic fluid collections  or inflammatory changes. Spleen: Unremarkable. Adrenals/Urinary Tract: Nonobstructive calculi in the lower pole collecting system of left kidney measuring up to 4 mm. In addition, there is a 1 cm  calculus at the left ureteropelvic junction associated with mild-to-moderate proximal hydronephrosis and extensive perinephric stranding indicating obstruction at this time. No additional calculi are noted in the collecting system of the right kidney, along the course of the right ureter or within the lumen of the urinary bladder. No right hydroureteronephrosis. Unenhanced appearance of the kidneys is otherwise unremarkable. Urinary bladder is nearly decompressed, but otherwise unremarkable in appearance. Bilateral adrenal glands are normal in appearance. Stomach/Bowel: Unenhanced appearance of the stomach is normal. No pathologic dilatation of small bowel or colon. The appendix is not confidently identified and may be surgically absent. Regardless, there are no inflammatory changes noted adjacent to the cecum to suggest the presence of an acute appendicitis at this time. 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 No lymphadenopathy noted in the abdomen or pelvis. Reproductive: Prostate gland and seminal vesicles are unremarkable in appearance. Other: No significant volume of ascites.  No pneumoperitoneum. Musculoskeletal: There are no aggressive appearing lytic or blastic lesions noted in the visualized portions of the skeleton. IMPRESSION: 1. 1 cm calculus at the left ureteropelvic junction with mild to moderate proximal left hydronephrosis and extensive perinephric stranding indicative of obstruction. 2. No acute findings noted in the thorax. 3. 2 additional nonobstructive calculi in the lower pole collecting  system of left kidney measuring up to 4 mm. 4. Aortic atherosclerosis, in addition to left main and 3 vessel coronary artery disease. Assessment for potential risk factor modification, dietary therapy or pharmacologic therapy may be warranted, if clinically indicated. 5. Probable cholelithiasis without evidence of acute cholecystitis at this time. 6. Mild cardiomegaly. 7. 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 Electronically Signed   By: Vinnie Langton M.D.   On: 06/24/2019 15:16   Dg Chest Port 1 View  Result Date: 06/25/2019 CLINICAL DATA:  Hypoxia EXAM: PORTABLE CHEST 1 VIEW COMPARISON:  06/24/2019 FINDINGS: Cardiac shadow remains enlarged but accentuated by the portable technique. The lungs are well aerated bilaterally. No focal infiltrate or sizable effusion is noted. Minimal basilar atelectatic changes are seen. IMPRESSION: Mild basilar atelectasis similar to that seen on prior CT examination. Electronically Signed   By: Inez Catalina M.D.   On: 06/25/2019 09:42   Dg Chest Portable 1 View  Result Date: 06/24/2019 CLINICAL DATA:  Shortness of breath. EXAM: PORTABLE CHEST 1 VIEW COMPARISON:  None. FINDINGS: Lung volumes are low but the lungs are clear. Heart size is upper normal. No pneumothorax or pleural fluid. No acute or focal bony abnormality. IMPRESSION: No acute disease. Electronically Signed   By: Inge Rise M.D.   On: 06/24/2019 12:22   Ir Nephrostomy Placement Left  Result Date: 06/25/2019 INDICATION: 78 year old male with a history of urosepsis structure ureteral stone EXAM: IMAGE GUIDED PERCUTANEOUS NEPHROSTOMY COMPARISON:  None. MEDICATIONS: Ciprofloxacin 400 mg IV; The antibiotic was administered in an appropriate time frame prior to skin puncture. ANESTHESIA/SEDATION: None The patient was continuously monitored during the procedure by the interventional radiology nurse under my direct supervision. CONTRAST:  15 cc-administered into the  collecting system(s) FLUOROSCOPY TIME:  Fluoroscopy Time: 0 minutes 42 seconds (16.9 mGy). COMPLICATIONS: None PROCEDURE: Informed written consent was obtained from the patient after a thorough discussion of the procedural risks, benefits and alternatives. All questions were addressed. Maximal Sterile Barrier Technique was  utilized including caps, mask, sterile gowns, sterile gloves, sterile drape, hand hygiene and skin antiseptic. A timeout was performed prior to the initiation of the procedure. Patient positioned prone position on the fluoroscopy table. Ultrasound survey of the left flank was performed with images stored and sent to PACs. The patient was then prepped and draped in the usual sterile fashion. 1% lidocaine was used to anesthetize the skin and subcutaneous tissues for local anesthesia. A Chiba needle was then used to access a posterior inferior calyx with ultrasound guidance. With spontaneous urine returned through the needle, passage of an 018 micro wire into the collecting system was performed under fluoroscopy. A small incision was made with an 11 blade scalpel, and the needle was removed from the wire. An Accustick system was then advanced over the wire into the collecting system under fluoroscopy. The metal stiffener and inner dilator were removed, and then a sample of fluid was aspirated through the 4 French outer sheath. Bentson wire was passed into the collecting system and the sheath removed. Ten French dilation of the soft tissues was performed. Using modified Seldinger technique, a 10 French pigtail catheter drain was placed over the Bentson wire. Wire and inner stiffener removed, and the pigtail was formed in the collecting system. Sample was sent for culture. Small amount of contrast confirmed position of the catheter. Patient tolerated the procedure well and remained hemodynamically stable throughout. No complications were encountered and no significant blood loss encountered IMPRESSION:  Status post image guided left-sided percutaneous nephrostomy. Signed, Dulcy Fanny. Dellia Nims, RPVI [facility] Rad[facility] Electronically Signed   By: Corrie Mckusick D.O.   On: 06/25/2019 08:15     Labs:   Basic Metabolic Panel: Recent Labs  Lab 06/26/19 0346 06/27/19 0156 06/29/19 0328 06/30/19 0302 07/01/19 0236  NA 142 147* 141 141 139  K 4.2 3.5 3.4* 3.8 3.8  CL 107 113* 106 106 107  CO2 24 28 26 25 24   GLUCOSE 147* 113* 132* 134* 130*  BUN 39* 42* 24* 20 17  CREATININE 1.58* 1.29* 1.25* 1.20 1.11  CALCIUM 7.8* 8.1* 7.8* 7.8* 8.2*  MG 2.1 2.2 1.7  --   --   PHOS  --  2.2*  --   --   --    GFR Estimated Creatinine Clearance: 68.7 mL/min (by C-G formula based on SCr of 1.11 mg/dL). Liver Function Tests: Recent Labs  Lab 06/26/19 0346 06/27/19 0156  AST 85*  --   ALT 30  --   ALKPHOS 56  --   BILITOT 1.6*  --   PROT 6.2*  --   ALBUMIN 2.8* 2.4*   No results for input(s): LIPASE, AMYLASE in the last 168 hours. No results for input(s): AMMONIA in the last 168 hours. Coagulation profile No results for input(s): INR, PROTIME in the last 168 hours.  CBC: Recent Labs  Lab 06/26/19 0346 06/27/19 0156 06/28/19 1824 06/29/19 0328 07/01/19 0236  WBC 12.7* 10.3  --  8.3 8.2  NEUTROABS  --  8.7*  --   --  6.0  HGB 11.9* 11.9* 11.7* 10.9* 11.0*  HCT 35.3* 35.8* 34.4* 32.0* 33.9*  MCV 92.7 94.0  --  92.8 93.1  PLT 140* 140*  --  168 182   Cardiac Enzymes: No results for input(s): CKTOTAL, CKMB, CKMBINDEX, TROPONINI in the last 168 hours. BNP: Invalid input(s): POCBNP CBG: Recent Labs  Lab 06/25/19 2149  GLUCAP 157*   D-Dimer No results for input(s): DDIMER  in the last 72 hours. Hgb A1c No results for input(s): HGBA1C in the last 72 hours. Lipid Profile No results for input(s): CHOL, HDL, LDLCALC, TRIG, CHOLHDL, LDLDIRECT in the last 72 hours. Thyroid function studies No results for input(s): TSH, T4TOTAL,  T3FREE, THYROIDAB in the last 72 hours.  Invalid input(s): FREET3 Anemia work up No results for input(s): VITAMINB12, FOLATE, FERRITIN, TIBC, IRON, RETICCTPCT in the last 72 hours. Microbiology Recent Results (from the past 240 hour(s))  Culture, blood (routine x 2)     Status: Abnormal   Collection Time: 06/24/19 11:58 AM   Specimen: BLOOD RIGHT HAND  Result Value Ref Range Status   Specimen Description BLOOD RIGHT HAND  Final   Special Requests   Final    BOTTLES DRAWN AEROBIC AND ANAEROBIC Blood Culture adequate volume   Culture  Setup Time   Final    IN BOTH AEROBIC AND ANAEROBIC BOTTLES GRAM NEGATIVE RODS CRITICAL RESULT CALLED TO, READ BACK BY AND VERIFIED WITH: Salli Real [id number] 06/25/2019 Mena Goes Performed at [facility], [address] [place], [place] [address]    Culture PROTEUS MIRABILIS (A)  Final   Report Status 06/27/2019 FINAL  Final   Organism ID, Bacteria PROTEUS MIRABILIS  Final      Susceptibility   Proteus mirabilis - MIC*    AMPICILLIN <=2 SENSITIVE Sensitive     CEFAZOLIN <=4 SENSITIVE Sensitive     CEFEPIME <=1 SENSITIVE Sensitive     CEFTAZIDIME <=1 SENSITIVE Sensitive     CEFTRIAXONE <=1 SENSITIVE Sensitive     CIPROFLOXACIN <=0.25 SENSITIVE Sensitive     GENTAMICIN <=1 SENSITIVE Sensitive     IMIPENEM 1 SENSITIVE Sensitive     TRIMETH/SULFA <=20 SENSITIVE Sensitive     AMPICILLIN/SULBACTAM <=2 SENSITIVE Sensitive     PIP/TAZO <=4 SENSITIVE Sensitive     * PROTEUS MIRABILIS  Blood Culture ID Panel (Reflexed)     Status: Abnormal   Collection Time: 06/24/19 11:58 AM  Result Value Ref Range Status   Enterococcus species NOT DETECTED NOT DETECTED Final   Listeria monocytogenes NOT DETECTED NOT DETECTED Final   Staphylococcus species NOT DETECTED NOT DETECTED Final   Staphylococcus aureus (BCID) NOT DETECTED NOT DETECTED Final   Streptococcus species NOT DETECTED NOT DETECTED Final   Streptococcus agalactiae NOT DETECTED NOT DETECTED  Final   Streptococcus pneumoniae NOT DETECTED NOT DETECTED Final   Streptococcus pyogenes NOT DETECTED NOT DETECTED Final   Acinetobacter baumannii NOT DETECTED NOT DETECTED Final   Enterobacteriaceae species DETECTED (A) NOT DETECTED Final    Comment: Enterobacteriaceae represent a large family of gram-negative bacteria, not a single organism. CRITICAL RESULT CALLED TO, READ BACK BY AND VERIFIED WITH: G. ABBOTT,PHARMD [id number] 06/25/2019 T. TYSOR    Enterobacter cloacae complex NOT DETECTED NOT DETECTED Final   Escherichia coli NOT DETECTED NOT DETECTED Final   Klebsiella oxytoca NOT DETECTED NOT DETECTED Final   Klebsiella pneumoniae NOT DETECTED NOT DETECTED Final   Proteus species DETECTED (A) NOT DETECTED Final    Comment: CRITICAL RESULT CALLED TO, READ BACK BY AND VERIFIED WITH: G. ABBOTT,PHARMD [id number] 06/25/2019 T. TYSOR    Serratia marcescens NOT DETECTED NOT DETECTED Final   Carbapenem resistance NOT DETECTED NOT DETECTED Final   Haemophilus influenzae NOT DETECTED NOT DETECTED Final   Neisseria meningitidis NOT DETECTED NOT DETECTED Final   Pseudomonas aeruginosa NOT DETECTED NOT DETECTED Final   Candida albicans NOT DETECTED NOT DETECTED Final   Candida glabrata NOT DETECTED NOT  DETECTED Final   Candida krusei NOT DETECTED NOT DETECTED Final   Candida parapsilosis NOT DETECTED NOT DETECTED Final   Candida tropicalis NOT DETECTED NOT DETECTED Final    Comment: Performed at [facility], [place] [address], [place], [place] [address]  SARS Coronavirus 2 [facility] order, Performed in [facility] hospital lab) Nasopharyngeal Nasopharyngeal Swab     Status: None   Collection Time: 06/24/19 11:59 AM   Specimen: Nasopharyngeal Swab  Result Value Ref Range Status   SARS Coronavirus 2 NEGATIVE NEGATIVE Final    Comment: (NOTE) If result is NEGATIVE SARS-CoV-2 target nucleic acids are NOT DETECTED. The SARS-CoV-2 RNA is generally detectable in upper and lower  respiratory specimens  during the acute phase of infection. The lowest  concentration of SARS-CoV-2 viral copies this assay can detect is 250  copies / mL. A negative result does not preclude SARS-CoV-2 infection  and should not be used as the sole basis for treatment or other  patient management decisions.  A negative result may occur with  improper specimen collection / handling, submission of specimen other  than nasopharyngeal swab, presence of viral mutation(s) within the  areas targeted by this assay, and inadequate number of viral copies  (<250 copies / mL). A negative result must be combined with clinical  observations, patient history, and epidemiological information. If result is POSITIVE SARS-CoV-2 target nucleic acids are DETECTED. The SARS-CoV-2 RNA is generally detectable in upper and lower  respiratory specimens dur ing the acute phase of infection.  Positive  results are indicative of active infection with SARS-CoV-2.  Clinical  correlation with patient history and other diagnostic information is  necessary to determine patient infection status.  Positive results do  not rule out bacterial infection or co-infection with other viruses. If result is PRESUMPTIVE POSTIVE SARS-CoV-2 nucleic acids MAY BE PRESENT.   A presumptive positive result was obtained on the submitted specimen  and confirmed on repeat testing.  While 2019 novel coronavirus  (SARS-CoV-2) nucleic acids may be present in the submitted sample  additional confirmatory testing may be necessary for epidemiological  and / or clinical management purposes  to differentiate between  SARS-CoV-2 and other Sarbecovirus currently known to infect humans.  If clinically indicated additional testing with an alternate test  methodology [phone]) is advised. The SARS-CoV-2 RNA is generally  detectable in upper and lower respiratory sp ecimens during the acute  phase of infection. The expected result is Negative. Fact Sheet for Patients:   StrictlyIdeas.no Fact Sheet for Healthcare Providers: BankingDealers.co.za This test is not yet approved or cleared by the [place] FDA and has been authorized for detection and/or diagnosis of SARS-CoV-2 by FDA under an Emergency Use Authorization (EUA).  This EUA will remain in effect (meaning this test can be used) for the duration of the COVID-19 declaration under Section 564(b)(1) of the Act, 21 U.S.C. section 360bbb-3(b)(1), unless the authorization is terminated or revoked sooner. Performed at [facility], [place] [address] [place], [place] [address]   Culture, blood (routine x 2)     Status: Abnormal   Collection Time: 06/24/19  2:10 PM   Specimen: BLOOD LEFT HAND  Result Value Ref Range Status   Specimen Description BLOOD LEFT HAND  Final   Special Requests   Final    BOTTLES DRAWN AEROBIC AND ANAEROBIC Blood Culture adequate volume   Culture  Setup Time   Final    GRAM NEGATIVE RODS IN BOTH AEROBIC AND ANAEROBIC BOTTLES  CRITICAL RESULT CALLED TO, READ BACK BY AND VERIFIED WITH: PHARMD J LEDFORD [id number] AT 642 AM BY CM    Culture (A)  Final    PROTEUS MIRABILIS SUSCEPTIBILITIES PERFORMED ON PREVIOUS CULTURE WITHIN THE LAST 5 DAYS. Performed at [facility], [place] [address], [place], [place] [address]    Report Status 06/27/2019 FINAL  Final  Urine culture     Status: Abnormal   Collection Time: 06/24/19  2:30 PM   Specimen: Urine, Catheterized  Result Value Ref Range Status   Specimen Description URINE, CATHETERIZED  Final   Special Requests   Final    NONE Performed at [facility], [place] [address] [place], [place] [address]    Culture >=100,000 COLONIES/mL PROTEUS MIRABILIS (A)  Final   Report Status 06/26/2019 FINAL  Final   Organism ID, Bacteria PROTEUS MIRABILIS (A)  Final      Susceptibility   Proteus mirabilis - MIC*    AMPICILLIN <=2 SENSITIVE Sensitive     CEFAZOLIN <=4 SENSITIVE  Sensitive     CEFTRIAXONE <=1 SENSITIVE Sensitive     CIPROFLOXACIN <=0.25 SENSITIVE Sensitive     GENTAMICIN <=1 SENSITIVE Sensitive     IMIPENEM 2 SENSITIVE Sensitive     NITROFURANTOIN 256 RESISTANT Resistant     TRIMETH/SULFA <=20 SENSITIVE Sensitive     AMPICILLIN/SULBACTAM <=2 SENSITIVE Sensitive     PIP/TAZO <=4 SENSITIVE Sensitive     * >=100,000 COLONIES/mL PROTEUS MIRABILIS  Aerobic/Anaerobic Culture (surgical/deep wound)     Status: None   Collection Time: 06/24/19  5:44 PM   Specimen: Abscess  Result Value Ref Range Status   Specimen Description ABSCESS LEFT PCN  Final   Special Requests NONE  Final   Gram Stain   Final    ABUNDANT WBC PRESENT,BOTH PMN AND MONONUCLEAR ABUNDANT GRAM VARIABLE ROD    Culture   Final    FEW PROTEUS MIRABILIS NO ANAEROBES ISOLATED Performed at [facility], [address], [place], [place] [address]    Report Status 06/29/2019 FINAL  Final   Organism ID, Bacteria PROTEUS MIRABILIS  Final      Susceptibility   Proteus mirabilis - MIC*    AMPICILLIN <=2 SENSITIVE Sensitive     CEFAZOLIN <=4 SENSITIVE Sensitive     CEFEPIME <=1 SENSITIVE Sensitive     CEFTAZIDIME <=1 SENSITIVE Sensitive     CEFTRIAXONE <=1 SENSITIVE Sensitive     CIPROFLOXACIN <=0.25 SENSITIVE Sensitive     GENTAMICIN <=1 SENSITIVE Sensitive     IMIPENEM 2 SENSITIVE Sensitive     TRIMETH/SULFA <=20 SENSITIVE Sensitive     AMPICILLIN/SULBACTAM <=2 SENSITIVE Sensitive     PIP/TAZO <=4 SENSITIVE Sensitive     * FEW PROTEUS MIRABILIS  MRSA PCR Screening     Status: None   Collection Time: 06/25/19  5:13 AM   Specimen: Nasal Mucosa; Nasopharyngeal  Result Value Ref Range Status   MRSA by PCR NEGATIVE NEGATIVE Final    Comment:        The GeneXpert MRSA Assay (FDA approved for NASAL specimens only), is one component of a comprehensive MRSA colonization surveillance program. It is not intended to diagnose MRSA infection nor to guide or monitor treatment  for MRSA infections. Performed at [facility], [place] [address], [place], [place] [address]   C Difficile Quick Screen w PCR reflex     Status: None   Collection Time: 06/26/19  5:30 PM   Specimen: Stool  Result Value Ref Range Status   C Diff antigen NEGATIVE NEGATIVE Final   C Diff toxin NEGATIVE NEGATIVE Final   C Diff interpretation No C. difficile detected.  Final    Comment: Performed at [facility], [place] [address], [place], [place] [address]  SARS CORONAVIRUS 2 (TAT 6-24 HRS)     Status: None   Collection Time: 07/01/19  3:45 PM  Result Value Ref Range Status   SARS Coronavirus 2 NEGATIVE NEGATIVE Final    Comment: (NOTE) SARS-CoV-2 target nucleic acids are NOT DETECTED. The SARS-CoV-2 RNA is generally detectable in upper and lower respiratory specimens during the acute phase of infection. Negative results do not preclude SARS-CoV-2 infection, do not rule out co-infections with other pathogens, and should not be used as the sole basis for treatment or other patient management decisions. Negative results must be combined with clinical observations, patient history, and epidemiological information. The expected result is Negative. Fact Sheet for Patients: SugarRoll.be Fact Sheet for Healthcare Providers: https://www.woods-mathews.com/ This test is not yet approved or cleared by the [place] FDA and  has been authorized for detection and/or diagnosis of SARS-CoV-2 by FDA under an Emergency Use Authorization (EUA). This EUA will remain  in effect (meaning this test can be used) for the duration of the COVID-19 declaration under Section 56 4(b)(1) of the Act, 21 U.S.C. section 360bbb-3(b)(1), unless the authorization is terminated or revoked sooner. Performed at [facility], [place] [address] [place], [place] [address]      Discharge Instructions:   Discharge Instructions    Diet - low sodium heart healthy    Complete by: As directed    Increase activity slowly   Complete by: As directed      Allergies as of 07/02/2019      Reactions   Aldactone [spironolactone]    - unknown      Medication List    STOP taking these medications   amLODipine 10 MG tablet Commonly known as: NORVASC   metoprolol tartrate 25 MG tablet Commonly known as: LOPRESSOR     TAKE these medications   atorvastatin 10 MG tablet Commonly known as: LIPITOR TAKE 1 TABLET DAILY AT 6 P.M. What changed: See the new instructions.   cephALEXin 500 MG capsule Commonly known as: KEFLEX Take 1 capsule (500 mg total) by mouth every 12 (twelve) hours.   furosemide 40 MG tablet Commonly known as: LASIX Take  20 mg  ( 1/2 tablet) twice a day except on Monday and Thursday take 40 mg twice a day. What changed:   how much to take  how to take this  when to take this   HYDROcodone-acetaminophen 5-325 MG tablet Commonly known as: NORCO/VICODIN Take 1 tablet by mouth 2 (two) times daily as needed for moderate pain. What changed:   when to take this  reasons to take this   magnesium oxide 400 (241.3 Mg) MG tablet Commonly known as: MAG-OX Take 1 tablet (400 mg total) by mouth daily.   metoprolol succinate 25 MG 24 hr tablet Commonly known as: TOPROL-XL Take 1 tablet (25 mg total) by mouth daily.   pantoprazole 40 MG tablet Commonly known as: PROTONIX Take 1 tablet (40 mg total) by mouth daily.   Xarelto 20 MG Tabs tablet Generic drug: rivaroxaban TAKE 1 TABLET DAILY WITH SUPPER What changed: See the new instructions.       Contact information for follow-up providers    Ceasar Mons, MD In 2  weeks.   Specialty: Urology Why: call for follow-up Contact information: [address] [place] [address] [phone]        [name], MD Follow up.   Specialty: Cardiology Why: You have a follow-up visit scheduled for 08/27/2019 at 9:20am with Dr. Ellyn Hack. Please arrive  15 minutes early for check-in. If this date/time does not work for you, please call our office to reschedule. Contact information: [address] [place] [address] [phone]        [name], [name], MD Follow up in 1 week(s).   Specialty: Family Medicine Contact information: [place] [address] [phone]            Contact information for after-discharge care    Destination    [facility] .   Service: Skilled Nursing Contact information: [address] [place] [phone]                   Time coordinating discharge: 35 min  Signed:  Geradine Girt DO  [facility] 07/02/2019, 1:01 PM

## 2019-07-01 NOTE — Care Management Important Message (Signed)
Important Message  Patient Details  Name: Patrick Bishop MRN: [id number] Date of Birth: [birth year]   Medicare Important Message Given:  Yes     Memory Argue 07/01/2019, 4:20 PM

## 2019-07-01 NOTE — NC FL2 (Signed)
[place] LEVEL OF CARE SCREENING TOOL     IDENTIFICATION  Patient Name: Patrick Bishop Birthdate: Apr 07, 1941 Sex: male Admission Date (Current Location): 06/24/2019  [facility] and [place] Number:  Herbalist and Address:  [facility], [place] [address], [place], [place] [address]      Provider Number: [id number]  Attending Physician Name and Address:  Geradine Girt, DO  Relative Name and Phone Number:  Neena Rhymes    Current Level of Care: Hospital Recommended Level of Care: [place] Prior Approval Number:    Date Approved/Denied:   PASRR Number: [id number]  Discharge Plan: SNF    Current Diagnoses: Patient Active Problem List   Diagnosis Date Noted  . Heme + stool   . Decreased hemoglobin   . Septic shock due to urinary tract infection ([place]) 06/24/2019  . Hydronephrosis with obstructing calculus 06/24/2019  . Cholelithiasis 06/24/2019  . Elevated troponin 06/24/2019  . Arthritis 07/12/2018  . Atrial fibrillation ([place]) 07/12/2018  . Chronic pain 07/12/2018  . Congestive heart failure ([place]) 07/12/2018  . Hearing loss 07/12/2018  . Lymphedema 07/12/2018  . Muscle weakness 07/12/2018  . Unsteady gait 07/12/2018  . Morbid obesity ([place]) 07/08/2018  . 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 Chronic diastolic HF (heart failure) ([place])   . Chronic venous insufficiency   . Essential hypertension   . Longstanding persistent atrial fibrillation: CHA2DSVasc = ~3 (age x 2, HTN)   . OSA on CPAP   . Pulmonary hypertension ([place])   . Hyperlipidemia with target LDL less than 100     Orientation RESPIRATION BLADDER Height & Weight     Self, Time, Situation, Place  Normal, Other (Comment)(uses C-pap at night) External catheter, Incontinent Weight: (!) 351 lb 6.6 oz (159.4 kg) Height:  6\' 1"  (185.4 cm)  BEHAVIORAL SYMPTOMS/MOOD NEUROLOGICAL BOWEL NUTRITION STATUS      Continent  Diet(please see discharge summary)  AMBULATORY STATUS COMMUNICATION OF NEEDS Skin     Verbally (cracking echymosis heel, le, right left  and echymosis buttocks, right)                       Personal Care Assistance Level of Assistance  Bathing, Feeding, Dressing Bathing Assistance: Limited assistance Feeding assistance: Independent Dressing Assistance: Limited assistance     Functional Limitations Info  Hearing, Sight, Speech Sight Info: Adequate Hearing Info: (patient is hard of hearing) Speech Info: Adequate    SPECIAL CARE FACTORS FREQUENCY  PT (By licensed PT), OT (By licensed OT)     PT Frequency: 5x per week OT Frequency: 5x per week            Contractures Contractures Info: Not present    Additional Factors Info  Code Status, Allergies Code Status Info: FULL Allergies Info: Aldactone           Current Medications (07/01/2019):  This is the current hospital active medication list Current Facility-Administered Medications  Medication Dose Route Frequency Provider Last Rate Last Dose  . 0.9 %  sodium chloride infusion (Manually program via Guardrails IV Fluids)   Intravenous Once Arrien, Jimmy Picket, MD      . acetaminophen (TYLENOL) tablet 650 mg  650 mg Oral Q6H PRN Karmen Bongo, MD       Or  . acetaminophen (TYLENOL) suppository 650 mg  650 mg Rectal Q6H PRN Karmen Bongo, MD      .  atorvastatin (LIPITOR) tablet 10 mg  10 mg Oral Daily Karmen Bongo, MD   10 mg at 07/01/19 1003  . bisacodyl (DULCOLAX) EC tablet 5 mg  5 mg Oral Daily PRN Arrien, Jimmy Picket, MD      . cephALEXin [facility]) capsule 500 mg  500 mg Oral Q12H Arrien, Jimmy Picket, MD   500 mg at 07/01/19 1003  . Chlorhexidine Gluconate Cloth 2 % PADS 6 each  6 each Topical Daily Elodia Florence., MD   6 each at 06/30/19 [id number]  . furosemide (LASIX) tablet 20 mg  20 mg Oral BID Marylyn Ishihara, Tyrone A, DO   20 mg at 07/01/19 0805  . Gerhardt's butt cream   Topical PRN Marylyn Ishihara, Tyrone  A, DO      . HYDROcodone-acetaminophen (NORCO/VICODIN) 5-325 MG per tablet 1 tablet  1 tablet Oral Q12H PRN Cherylann Ratel A, DO   1 tablet at 07/01/19 0804  . ipratropium (ATROVENT) nebulizer solution 0.5 mg  0.5 mg Nebulization TID Arrien, Jimmy Picket, MD   0.5 mg at 07/01/19 1320  . levalbuterol (XOPENEX) nebulizer solution 0.63 mg  0.63 mg Nebulization Q6H PRN Elodia Florence., MD      . levalbuterol [facility]) nebulizer solution 0.63 mg  0.63 mg Nebulization TID Tawni Millers, MD   0.63 mg at 07/01/19 1320  . magnesium oxide (MAG-OX) tablet 400 mg  400 mg Oral TID Tawni Millers, MD   400 mg at 07/01/19 1003  . metoprolol succinate (TOPROL-XL) 24 hr tablet 25 mg  25 mg Oral Daily O'Neal, Cassie Freer, MD   25 mg at 07/01/19 1003  . pantoprazole (PROTONIX) EC tablet 40 mg  40 mg Oral Daily Pokhrel, Laxman, MD   40 mg at 07/01/19 1005  . polyethylene glycol (MIRALAX / GLYCOLAX) packet 17 g  17 g Oral Daily PRN Karmen Bongo, MD   17 g at 06/30/19 0908  . prochlorperazine (COMPAZINE) injection 10 mg  10 mg Intravenous Q4H PRN Gardiner Barefoot, NP   10 mg at 06/26/19 1648  . promethazine (PHENERGAN) injection 12.5 mg  12.5 mg Intravenous Q8H PRN Marylyn Ishihara, Tyrone A, DO   12.5 mg at 06/26/19 1200   Or  . promethazine (PHENERGAN) suppository 25 mg  25 mg Rectal Q8H PRN Marylyn Ishihara, Tyrone A, DO      . rivaroxaban (XARELTO) tablet 20 mg  20 mg Oral Q supper Vann, Jessica U, DO      . sodium chloride flush (NS) 0.9 % injection 3 mL  3 mL Intravenous Q12H Karmen Bongo, MD   3 mL at 07/01/19 1008     Discharge Medications: Please see discharge summary for a list of discharge medications.  Relevant Imaging Results:  Relevant Lab Results:   Additional Information SSN [id number]    CPAP at night (Adult - Nasal Mask Med)  Vinie Sill, LCSWA

## 2019-07-02 DIAGNOSIS — N200 Calculus of kidney: Secondary | ICD-10-CM | POA: Diagnosis not present

## 2019-07-02 DIAGNOSIS — E663 Overweight: Secondary | ICD-10-CM | POA: Diagnosis not present

## 2019-07-02 DIAGNOSIS — M6281 Muscle weakness (generalized): Secondary | ICD-10-CM | POA: Diagnosis not present

## 2019-07-02 DIAGNOSIS — I5033 Acute on chronic diastolic (congestive) heart failure: Secondary | ICD-10-CM | POA: Diagnosis not present

## 2019-07-02 DIAGNOSIS — R0902 Hypoxemia: Secondary | ICD-10-CM | POA: Diagnosis not present

## 2019-07-02 DIAGNOSIS — A419 Sepsis, unspecified organism: Secondary | ICD-10-CM | POA: Diagnosis not present

## 2019-07-02 DIAGNOSIS — I482 Chronic atrial fibrillation, unspecified: Secondary | ICD-10-CM | POA: Diagnosis not present

## 2019-07-02 DIAGNOSIS — R6521 Severe sepsis with septic shock: Secondary | ICD-10-CM | POA: Diagnosis not present

## 2019-07-02 DIAGNOSIS — M2550 Pain in unspecified joint: Secondary | ICD-10-CM | POA: Diagnosis not present

## 2019-07-02 DIAGNOSIS — J449 Chronic obstructive pulmonary disease, unspecified: Secondary | ICD-10-CM | POA: Diagnosis not present

## 2019-07-02 DIAGNOSIS — I5032 Chronic diastolic (congestive) heart failure: Secondary | ICD-10-CM | POA: Diagnosis not present

## 2019-07-02 DIAGNOSIS — R52 Pain, unspecified: Secondary | ICD-10-CM | POA: Diagnosis not present

## 2019-07-02 DIAGNOSIS — R002 Palpitations: Secondary | ICD-10-CM | POA: Diagnosis not present

## 2019-07-02 DIAGNOSIS — I10 Essential (primary) hypertension: Secondary | ICD-10-CM | POA: Diagnosis not present

## 2019-07-02 DIAGNOSIS — B964 Proteus (mirabilis) (morganii) as the cause of diseases classified elsewhere: Secondary | ICD-10-CM | POA: Diagnosis not present

## 2019-07-02 DIAGNOSIS — N390 Urinary tract infection, site not specified: Secondary | ICD-10-CM | POA: Diagnosis not present

## 2019-07-02 DIAGNOSIS — N12 Tubulo-interstitial nephritis, not specified as acute or chronic: Secondary | ICD-10-CM | POA: Diagnosis not present

## 2019-07-02 DIAGNOSIS — I872 Venous insufficiency (chronic) (peripheral): Secondary | ICD-10-CM | POA: Diagnosis not present

## 2019-07-02 DIAGNOSIS — Z7401 Bed confinement status: Secondary | ICD-10-CM | POA: Diagnosis not present

## 2019-07-02 DIAGNOSIS — R5381 Other malaise: Secondary | ICD-10-CM | POA: Diagnosis not present

## 2019-07-02 DIAGNOSIS — N201 Calculus of ureter: Secondary | ICD-10-CM | POA: Diagnosis not present

## 2019-07-02 DIAGNOSIS — E785 Hyperlipidemia, unspecified: Secondary | ICD-10-CM | POA: Diagnosis not present

## 2019-07-02 LAB — GI PATHOGEN PANEL BY PCR, STOOL

## 2019-07-02 MED ORDER — HYDROCODONE-ACETAMINOPHEN 5-325 MG PO TABS: 1.0000 | ORAL_TABLET | Freq: Two times a day (BID) | ORAL | 0 refills | Status: DC | PRN

## 2019-07-02 NOTE — TOC Progression Note (Addendum)
Transition of Care [facility]) - Progression Note    Patient Details  Name: Patrick Bishop MRN: [id number] Date of Birth: 1941/06/16  [facility] CM/SW [place], [place] Phone Number: [phone] 07/02/2019, 9:24 AM  Clinical Narrative:     Update: Lyda texted CSW they choose [facility]. CSW has reached out to rep [place] for bed number and ensure bed avail today for patient's discharge. Juliann Pulse got back with CSW and reports no beds avail today but bed tomorrow. CSW informed Lyda who reports next choice is [facility]. CSW has contacted Bryson Ha with [facility] who reports no beds but does have avail at [facility]. Lyda reports [place] is fine.   CSW spoke with patient's daughter [name] regarding bed offers and informed her that their preference of [facility] did not make a bed offer. CSW informed her of the current bed offers and each facility to choose from, [name] wrote them down and reports she will be researching them and will call CSW back with choice. CSW reiterated to [name] that patient is medically stable to discharge today and that a timely response is needed. Lynda expressed understanding.    Expected Discharge Plan: [place]    Expected Discharge Plan and Services Expected Discharge Plan: [place] In-house Referral: Clinical Social Work       Expected Discharge Date: 07/01/19                                     Social Determinants of Health (SDOH) Interventions    Readmission Risk Interventions No flowsheet data found.

## 2019-07-02 NOTE — Plan of Care (Signed)
  Problem: Education: Goal: Knowledge of General Education information will improve Description: Including pain rating scale, medication(s)/side effects and non-pharmacologic comfort measures 07/02/2019 1417 by Shanon Ace, RN Outcome: Adequate for Discharge 07/02/2019 1417 by Shanon Ace, RN Outcome: Adequate for Discharge 07/02/2019 1107 by Shanon Ace, RN Outcome: Progressing   Problem: Clinical Measurements: Goal: Ability to maintain clinical measurements within normal limits will improve 07/02/2019 1417 by Shanon Ace, RN Outcome: Adequate for Discharge 07/02/2019 1417 by Shanon Ace, RN Outcome: Adequate for Discharge 07/02/2019 1107 by Shanon Ace, RN Outcome: Progressing Goal: Will remain free from infection 07/02/2019 1417 by Shanon Ace, RN Outcome: Adequate for Discharge 07/02/2019 1417 by Shanon Ace, RN Outcome: Adequate for Discharge 07/02/2019 1107 by Shanon Ace, RN Outcome: Progressing Goal: Diagnostic test results will improve 07/02/2019 1417 by Shanon Ace, RN Outcome: Adequate for Discharge 07/02/2019 1417 by Shanon Ace, RN Outcome: Adequate for Discharge 07/02/2019 1107 by Shanon Ace, RN Outcome: Progressing Goal: Respiratory complications will improve 07/02/2019 1417 by Shanon Ace, RN Outcome: Adequate for Discharge 07/02/2019 1417 by Shanon Ace, RN Outcome: Adequate for Discharge 07/02/2019 1107 by Shanon Ace, RN Outcome: Progressing   Problem: Elimination: Goal: Will not experience complications related to bowel motility 07/02/2019 1417 by Shanon Ace, RN Outcome: Adequate for Discharge 07/02/2019 1417 by Shanon Ace, RN Outcome: Adequate for Discharge 07/02/2019 1107 by Shanon Ace, RN Outcome: Progressing Goal: Will not experience complications related to urinary retention 07/02/2019 1417 by Shanon Ace, RN Outcome: Adequate for Discharge 07/02/2019 1417 by Shanon Ace, RN Outcome: Adequate for Discharge 07/02/2019  1107 by Shanon Ace, RN Outcome: Progressing   Problem: Safety: Goal: Ability to remain free from injury will improve 07/02/2019 1417 by Shanon Ace, RN Outcome: Adequate for Discharge 07/02/2019 1417 by Shanon Ace, RN Outcome: Adequate for Discharge 07/02/2019 1107 by Shanon Ace, RN Outcome: Progressing   Problem: Fluid Volume: Goal: Hemodynamic stability will improve 07/02/2019 1417 by Shanon Ace, RN Outcome: Adequate for Discharge 07/02/2019 1417 by Shanon Ace, RN Outcome: Adequate for Discharge 07/02/2019 1107 by Shanon Ace, RN Outcome: Progressing   Problem: Clinical Measurements: Goal: Diagnostic test results will improve 07/02/2019 1417 by Shanon Ace, RN Outcome: Adequate for Discharge 07/02/2019 1417 by Shanon Ace, RN Outcome: Adequate for Discharge 07/02/2019 1107 by Shanon Ace, RN Outcome: Progressing Goal: Signs and symptoms of infection will decrease 07/02/2019 1417 by Shanon Ace, RN Outcome: Adequate for Discharge 07/02/2019 1417 by Shanon Ace, RN Outcome: Adequate for Discharge 07/02/2019 1107 by Shanon Ace, RN Outcome: Progressing   Problem: Respiratory: Goal: Ability to maintain adequate ventilation will improve 07/02/2019 1417 by Shanon Ace, RN Outcome: Adequate for Discharge 07/02/2019 1417 by Shanon Ace, RN Outcome: Adequate for Discharge 07/02/2019 1107 by Shanon Ace, RN Outcome: Progressing   Problem: Acute Rehab PT Goals(only PT should resolve) Goal: Pt Will Go Supine/Side To Sit Outcome: Adequate for Discharge Goal: Patient Will Transfer Sit To/From Stand Outcome: Adequate for Discharge Goal: Pt Will Ambulate Outcome: Adequate for Discharge Goal: Pt/caregiver will Perform Home Exercise Program Outcome: Adequate for Discharge   Problem: Acute Rehab OT Goals (only OT should resolve) Goal: Pt. Will Perform Grooming Outcome: Adequate for Discharge Goal: Pt. Will Perform Lower Body Dressing Outcome: Adequate  for Discharge Goal: Pt. Will Transfer To Toilet Outcome: Adequate for Discharge Goal: Pt. Will Perform Toileting-Clothing Manipulation Outcome: Adequate for Discharge Goal: OT Additional ADL Goal #1 Outcome: Adequate for Discharge

## 2019-07-02 NOTE — Plan of Care (Signed)
  Problem: Education: Goal: Knowledge of General Education information will improve Description: Including pain rating scale, medication(s)/side effects and non-pharmacologic comfort measures 07/02/2019 1417 by Shanon Ace, RN Outcome: Adequate for Discharge 07/02/2019 1107 by Shanon Ace, RN Outcome: Progressing   Problem: Clinical Measurements: Goal: Ability to maintain clinical measurements within normal limits will improve 07/02/2019 1417 by Shanon Ace, RN Outcome: Adequate for Discharge 07/02/2019 1107 by Shanon Ace, RN Outcome: Progressing Goal: Will remain free from infection 07/02/2019 1417 by Shanon Ace, RN Outcome: Adequate for Discharge 07/02/2019 1107 by Shanon Ace, RN Outcome: Progressing Goal: Diagnostic test results will improve 07/02/2019 1417 by Shanon Ace, RN Outcome: Adequate for Discharge 07/02/2019 1107 by Shanon Ace, RN Outcome: Progressing Goal: Respiratory complications will improve 07/02/2019 1417 by Shanon Ace, RN Outcome: Adequate for Discharge 07/02/2019 1107 by Shanon Ace, RN Outcome: Progressing   Problem: Elimination: Goal: Will not experience complications related to bowel motility 07/02/2019 1417 by Shanon Ace, RN Outcome: Adequate for Discharge 07/02/2019 1107 by Shanon Ace, RN Outcome: Progressing Goal: Will not experience complications related to urinary retention 07/02/2019 1417 by Shanon Ace, RN Outcome: Adequate for Discharge 07/02/2019 1107 by Shanon Ace, RN Outcome: Progressing   Problem: Safety: Goal: Ability to remain free from injury will improve 07/02/2019 1417 by Shanon Ace, RN Outcome: Adequate for Discharge 07/02/2019 1107 by Shanon Ace, RN Outcome: Progressing   Problem: Fluid Volume: Goal: Hemodynamic stability will improve 07/02/2019 1417 by Shanon Ace, RN Outcome: Adequate for Discharge 07/02/2019 1107 by Shanon Ace, RN Outcome: Progressing   Problem: Clinical  Measurements: Goal: Diagnostic test results will improve 07/02/2019 1417 by Shanon Ace, RN Outcome: Adequate for Discharge 07/02/2019 1107 by Shanon Ace, RN Outcome: Progressing Goal: Signs and symptoms of infection will decrease 07/02/2019 1417 by Shanon Ace, RN Outcome: Adequate for Discharge 07/02/2019 1107 by Shanon Ace, RN Outcome: Progressing   Problem: Respiratory: Goal: Ability to maintain adequate ventilation will improve 07/02/2019 1417 by Shanon Ace, RN Outcome: Adequate for Discharge 07/02/2019 1107 by Shanon Ace, RN Outcome: Progressing

## 2019-07-02 NOTE — Progress Notes (Signed)
No overnight events.  Please see H&P from 10/5. COVID-19 negative on 10/5 Patrick Bear DO

## 2019-07-02 NOTE — TOC Transition Note (Signed)
Transition of Care [facility]) - CM/SW Discharge Note   Patient Details  Name: Patrick Bishop MRN: [id number] Date of Birth: 02-05-1941  [facility]) CM/SW Contact:  Alberteen Sam, LCSW Phone Number: 07/02/2019, 1:18 PM   Clinical Narrative:     Patient will DC to: [place] Anticipated DC date: 07/02/2019 Family notified:Lynda Transport PY:KDXI  Per MD patient ready for DC to [facility] . RN, patient, patient's family, and facility notified of DC. Discharge Summary sent to facility. RN given number for report [phone]  . DC packet on chart. Ambulance transport requested for patient.  CSW signing off.  [place], [place]   Final next level of care: Skilled Nursing Facility Barriers to Discharge: No Barriers Identified   Patient Goals and CMS Choice   CMS Medicare.gov Compare Post Acute Care list provided to:: Patient Represenative (must comment)(daughter [name]) Choice offered to / list presented to : Adult Children(Lynda)  Discharge Placement PASRR number recieved: 07/01/19            Patient chooses bed at: Other - please specify in the comment section below:([facility]) Patient to be transferred to facility by: [place] Name of family member notified: Lynda Patient and family notified of of transfer: 07/02/19  Discharge Plan and Services In-house Referral: Clinical Social Work                                   Social Determinants of Health (SDOH) Interventions     Readmission Risk Interventions No flowsheet data found.

## 2019-07-02 NOTE — Progress Notes (Signed)
Called report to SNF x2 Nurse was busy in room.. Left message for nurse to return call.  PTAR her to pick up Pt.

## 2019-07-02 NOTE — Plan of Care (Signed)
  Problem: Education: Goal: Knowledge of General Education information will improve Description: Including pain rating scale, medication(s)/side effects and non-pharmacologic comfort measures Outcome: Progressing   Problem: Clinical Measurements: Goal: Ability to maintain clinical measurements within normal limits will improve Outcome: Progressing Goal: Will remain free from infection Outcome: Progressing Goal: Diagnostic test results will improve Outcome: Progressing Goal: Respiratory complications will improve Outcome: Progressing   Problem: Elimination: Goal: Will not experience complications related to bowel motility Outcome: Progressing Goal: Will not experience complications related to urinary retention Outcome: Progressing   Problem: Safety: Goal: Ability to remain free from injury will improve Outcome: Progressing   Problem: Fluid Volume: Goal: Hemodynamic stability will improve Outcome: Progressing   Problem: Clinical Measurements: Goal: Diagnostic test results will improve Outcome: Progressing Goal: Signs and symptoms of infection will decrease Outcome: Progressing   Problem: Respiratory: Goal: Ability to maintain adequate ventilation will improve Outcome: Progressing

## 2019-07-05 DIAGNOSIS — N390 Urinary tract infection, site not specified: Secondary | ICD-10-CM | POA: Diagnosis not present

## 2019-07-05 DIAGNOSIS — R6521 Severe sepsis with septic shock: Secondary | ICD-10-CM | POA: Diagnosis not present

## 2019-07-05 DIAGNOSIS — N12 Tubulo-interstitial nephritis, not specified as acute or chronic: Secondary | ICD-10-CM | POA: Diagnosis not present

## 2019-07-05 DIAGNOSIS — A419 Sepsis, unspecified organism: Secondary | ICD-10-CM | POA: Diagnosis not present

## 2019-07-11 DIAGNOSIS — N201 Calculus of ureter: Secondary | ICD-10-CM | POA: Diagnosis not present

## 2019-07-12 ENCOUNTER — Telehealth: Payer: Self-pay | Admitting: Cardiology

## 2019-07-12 DIAGNOSIS — I5033 Acute on chronic diastolic (congestive) heart failure: Secondary | ICD-10-CM | POA: Diagnosis not present

## 2019-07-12 DIAGNOSIS — N12 Tubulo-interstitial nephritis, not specified as acute or chronic: Secondary | ICD-10-CM | POA: Diagnosis not present

## 2019-07-12 DIAGNOSIS — R6521 Severe sepsis with septic shock: Secondary | ICD-10-CM | POA: Diagnosis not present

## 2019-07-12 DIAGNOSIS — I10 Essential (primary) hypertension: Secondary | ICD-10-CM | POA: Diagnosis not present

## 2019-07-12 NOTE — Telephone Encounter (Signed)
New message      [facility] Pre-operative Risk Assessment    Request for surgical clearance:  1. What type of surgery is being performed?Cystoscopy left retrograde pyelogram ureteroscopy laser lithotripsy stent placement  2. When is this surgery scheduled? TBD  3. What type of clearance is required (medical clearance vs. Pharmacy clearance to hold med vs. Both)? both  Are there any medications that need to be held prior to surgery and how long?xarelto needs to be held 3 days prior  4. Practice name and name of physician performing surgery? [facility], Dr. Ellison Hughs  5. What is your office phone [phone] ext [phone]   7.   What is your office fax number [phone]  8.   Anesthesia type (None, local, MAC, general) ? general   Maryjane Hurter 07/12/2019, 9:23 AM  _________________________________________________________________   (provider comments below)

## 2019-07-15 NOTE — Telephone Encounter (Signed)
Pt takes Xarelto for afib with CHADS2VASc score of 5 (age x2, CHF, HTN, PVD). Renal function is normal. Recently admitted for sepsis secondary to UTI. Ok to hold Xarelto for 3 days as requested prior to procedure.

## 2019-07-15 NOTE — Telephone Encounter (Signed)
   Primary Cardiologist: Glenetta Hew, MD  Chart reviewed as part of pre-operative protocol coverage. He was admitted last month for septic shock due to urinary tract infection. Hospital course complicated by afib RVR. Currently at rehab facility. Doing well without any cardiac issue.  Given past medical history and time since last visit, based on ACC/AHA guidelines, Patrick Bishop would be at acceptable risk for the planned procedure without further cardiovascular testing.   Pharmacy to review anticoagulation.   [place], [place] 07/15/2019, 9:32 AM

## 2019-07-16 DIAGNOSIS — I482 Chronic atrial fibrillation, unspecified: Secondary | ICD-10-CM | POA: Diagnosis not present

## 2019-07-16 DIAGNOSIS — N12 Tubulo-interstitial nephritis, not specified as acute or chronic: Secondary | ICD-10-CM | POA: Diagnosis not present

## 2019-07-16 DIAGNOSIS — I10 Essential (primary) hypertension: Secondary | ICD-10-CM | POA: Diagnosis not present

## 2019-07-16 DIAGNOSIS — E785 Hyperlipidemia, unspecified: Secondary | ICD-10-CM | POA: Diagnosis not present

## 2019-07-23 DIAGNOSIS — N200 Calculus of kidney: Secondary | ICD-10-CM | POA: Diagnosis not present

## 2019-07-23 DIAGNOSIS — I482 Chronic atrial fibrillation, unspecified: Secondary | ICD-10-CM | POA: Diagnosis not present

## 2019-07-23 DIAGNOSIS — N12 Tubulo-interstitial nephritis, not specified as acute or chronic: Secondary | ICD-10-CM | POA: Diagnosis not present

## 2019-07-23 DIAGNOSIS — R5381 Other malaise: Secondary | ICD-10-CM | POA: Diagnosis not present

## 2019-07-26 DIAGNOSIS — I509 Heart failure, unspecified: Secondary | ICD-10-CM | POA: Diagnosis not present

## 2019-07-26 DIAGNOSIS — Z79899 Other long term (current) drug therapy: Secondary | ICD-10-CM | POA: Diagnosis not present

## 2019-07-26 DIAGNOSIS — N201 Calculus of ureter: Secondary | ICD-10-CM | POA: Diagnosis not present

## 2019-07-26 DIAGNOSIS — Z01818 Encounter for other preprocedural examination: Secondary | ICD-10-CM | POA: Diagnosis not present

## 2019-07-26 DIAGNOSIS — I10 Essential (primary) hypertension: Secondary | ICD-10-CM | POA: Diagnosis not present

## 2019-07-26 DIAGNOSIS — I714 Abdominal aortic aneurysm, without rupture: Secondary | ICD-10-CM | POA: Diagnosis not present

## 2019-07-26 DIAGNOSIS — I890 Lymphedema, not elsewhere classified: Secondary | ICD-10-CM | POA: Diagnosis not present

## 2019-07-26 DIAGNOSIS — I739 Peripheral vascular disease, unspecified: Secondary | ICD-10-CM | POA: Diagnosis not present

## 2019-07-27 DIAGNOSIS — I890 Lymphedema, not elsewhere classified: Secondary | ICD-10-CM | POA: Diagnosis not present

## 2019-07-27 DIAGNOSIS — Z7901 Long term (current) use of anticoagulants: Secondary | ICD-10-CM | POA: Diagnosis not present

## 2019-07-27 DIAGNOSIS — I110 Hypertensive heart disease with heart failure: Secondary | ICD-10-CM | POA: Diagnosis not present

## 2019-07-27 DIAGNOSIS — I872 Venous insufficiency (chronic) (peripheral): Secondary | ICD-10-CM | POA: Diagnosis not present

## 2019-07-27 DIAGNOSIS — I4811 Longstanding persistent atrial fibrillation: Secondary | ICD-10-CM | POA: Diagnosis not present

## 2019-07-27 DIAGNOSIS — Z6830 Body mass index (BMI) 30.0-30.9, adult: Secondary | ICD-10-CM | POA: Diagnosis not present

## 2019-07-27 DIAGNOSIS — I5033 Acute on chronic diastolic (congestive) heart failure: Secondary | ICD-10-CM | POA: Diagnosis not present

## 2019-07-27 DIAGNOSIS — G4733 Obstructive sleep apnea (adult) (pediatric): Secondary | ICD-10-CM | POA: Diagnosis not present

## 2019-07-27 DIAGNOSIS — E785 Hyperlipidemia, unspecified: Secondary | ICD-10-CM | POA: Diagnosis not present

## 2019-07-27 DIAGNOSIS — I272 Pulmonary hypertension, unspecified: Secondary | ICD-10-CM | POA: Diagnosis not present

## 2019-07-27 DIAGNOSIS — I739 Peripheral vascular disease, unspecified: Secondary | ICD-10-CM | POA: Diagnosis not present

## 2019-07-27 DIAGNOSIS — I5032 Chronic diastolic (congestive) heart failure: Secondary | ICD-10-CM | POA: Diagnosis not present

## 2019-07-27 DIAGNOSIS — L89312 Pressure ulcer of right buttock, stage 2: Secondary | ICD-10-CM | POA: Diagnosis not present

## 2019-07-27 DIAGNOSIS — A419 Sepsis, unspecified organism: Secondary | ICD-10-CM | POA: Diagnosis not present

## 2019-07-27 DIAGNOSIS — Z436 Encounter for attention to other artificial openings of urinary tract: Secondary | ICD-10-CM | POA: Diagnosis not present

## 2019-07-28 DIAGNOSIS — Z436 Encounter for attention to other artificial openings of urinary tract: Secondary | ICD-10-CM | POA: Diagnosis not present

## 2019-07-28 DIAGNOSIS — I272 Pulmonary hypertension, unspecified: Secondary | ICD-10-CM | POA: Diagnosis not present

## 2019-07-28 DIAGNOSIS — L89312 Pressure ulcer of right buttock, stage 2: Secondary | ICD-10-CM | POA: Diagnosis not present

## 2019-07-28 DIAGNOSIS — I110 Hypertensive heart disease with heart failure: Secondary | ICD-10-CM | POA: Diagnosis not present

## 2019-07-28 DIAGNOSIS — I4811 Longstanding persistent atrial fibrillation: Secondary | ICD-10-CM | POA: Diagnosis not present

## 2019-07-28 DIAGNOSIS — I5033 Acute on chronic diastolic (congestive) heart failure: Secondary | ICD-10-CM | POA: Diagnosis not present

## 2019-07-31 DIAGNOSIS — L89312 Pressure ulcer of right buttock, stage 2: Secondary | ICD-10-CM | POA: Diagnosis not present

## 2019-07-31 DIAGNOSIS — I110 Hypertensive heart disease with heart failure: Secondary | ICD-10-CM | POA: Diagnosis not present

## 2019-07-31 DIAGNOSIS — I272 Pulmonary hypertension, unspecified: Secondary | ICD-10-CM | POA: Diagnosis not present

## 2019-07-31 DIAGNOSIS — I4811 Longstanding persistent atrial fibrillation: Secondary | ICD-10-CM | POA: Diagnosis not present

## 2019-07-31 DIAGNOSIS — I5033 Acute on chronic diastolic (congestive) heart failure: Secondary | ICD-10-CM | POA: Diagnosis not present

## 2019-07-31 DIAGNOSIS — Z436 Encounter for attention to other artificial openings of urinary tract: Secondary | ICD-10-CM | POA: Diagnosis not present

## 2019-08-01 DIAGNOSIS — I272 Pulmonary hypertension, unspecified: Secondary | ICD-10-CM | POA: Diagnosis not present

## 2019-08-01 DIAGNOSIS — L89312 Pressure ulcer of right buttock, stage 2: Secondary | ICD-10-CM | POA: Diagnosis not present

## 2019-08-01 DIAGNOSIS — I4811 Longstanding persistent atrial fibrillation: Secondary | ICD-10-CM | POA: Diagnosis not present

## 2019-08-01 DIAGNOSIS — Z436 Encounter for attention to other artificial openings of urinary tract: Secondary | ICD-10-CM | POA: Diagnosis not present

## 2019-08-01 DIAGNOSIS — I5033 Acute on chronic diastolic (congestive) heart failure: Secondary | ICD-10-CM | POA: Diagnosis not present

## 2019-08-01 DIAGNOSIS — I110 Hypertensive heart disease with heart failure: Secondary | ICD-10-CM | POA: Diagnosis not present

## 2019-08-02 ENCOUNTER — Other Ambulatory Visit: Payer: Self-pay

## 2019-08-02 ENCOUNTER — Encounter (HOSPITAL_COMMUNITY): Payer: Self-pay | Admitting: Emergency Medicine

## 2019-08-02 ENCOUNTER — Inpatient Hospital Stay (HOSPITAL_COMMUNITY)
Admission: EM | Admit: 2019-08-02 | Discharge: 2019-08-09 | DRG: 177 | Disposition: A | Discharge status: Home Health | Payer: Medicare Other | Attending: Internal Medicine | Admitting: Internal Medicine

## 2019-08-02 ENCOUNTER — Emergency Department (HOSPITAL_COMMUNITY): Payer: Medicare Other

## 2019-08-02 DIAGNOSIS — R05 Cough: Secondary | ICD-10-CM | POA: Diagnosis not present

## 2019-08-02 DIAGNOSIS — I4821 Permanent atrial fibrillation: Secondary | ICD-10-CM | POA: Diagnosis present

## 2019-08-02 DIAGNOSIS — Z8249 Family history of ischemic heart disease and other diseases of the circulatory system: Secondary | ICD-10-CM

## 2019-08-02 DIAGNOSIS — I5032 Chronic diastolic (congestive) heart failure: Secondary | ICD-10-CM | POA: Diagnosis not present

## 2019-08-02 DIAGNOSIS — Z20822 Contact with and (suspected) exposure to covid-19: Secondary | ICD-10-CM | POA: Diagnosis present

## 2019-08-02 DIAGNOSIS — R9431 Abnormal electrocardiogram [ECG] [EKG]: Secondary | ICD-10-CM | POA: Diagnosis not present

## 2019-08-02 DIAGNOSIS — Z79891 Long term (current) use of opiate analgesic: Secondary | ICD-10-CM

## 2019-08-02 DIAGNOSIS — I872 Venous insufficiency (chronic) (peripheral): Secondary | ICD-10-CM | POA: Diagnosis present

## 2019-08-02 DIAGNOSIS — J1282 Pneumonia due to coronavirus disease 2019: Secondary | ICD-10-CM

## 2019-08-02 DIAGNOSIS — J189 Pneumonia, unspecified organism: Secondary | ICD-10-CM | POA: Diagnosis present

## 2019-08-02 DIAGNOSIS — U071 COVID-19: Secondary | ICD-10-CM | POA: Diagnosis not present

## 2019-08-02 DIAGNOSIS — Z87891 Personal history of nicotine dependence: Secondary | ICD-10-CM

## 2019-08-02 DIAGNOSIS — Z936 Other artificial openings of urinary tract status: Secondary | ICD-10-CM

## 2019-08-02 DIAGNOSIS — I110 Hypertensive heart disease with heart failure: Secondary | ICD-10-CM | POA: Diagnosis present

## 2019-08-02 DIAGNOSIS — Z87442 Personal history of urinary calculi: Secondary | ICD-10-CM

## 2019-08-02 DIAGNOSIS — N132 Hydronephrosis with renal and ureteral calculous obstruction: Secondary | ICD-10-CM | POA: Diagnosis present

## 2019-08-02 DIAGNOSIS — D72819 Decreased white blood cell count, unspecified: Secondary | ICD-10-CM | POA: Diagnosis present

## 2019-08-02 DIAGNOSIS — Z7901 Long term (current) use of anticoagulants: Secondary | ICD-10-CM

## 2019-08-02 DIAGNOSIS — Z20828 Contact with and (suspected) exposure to other viral communicable diseases: Secondary | ICD-10-CM

## 2019-08-02 DIAGNOSIS — R0602 Shortness of breath: Secondary | ICD-10-CM | POA: Diagnosis present

## 2019-08-02 DIAGNOSIS — Z79899 Other long term (current) drug therapy: Secondary | ICD-10-CM

## 2019-08-02 DIAGNOSIS — I4811 Longstanding persistent atrial fibrillation: Secondary | ICD-10-CM | POA: Diagnosis present

## 2019-08-02 DIAGNOSIS — E785 Hyperlipidemia, unspecified: Secondary | ICD-10-CM | POA: Diagnosis present

## 2019-08-02 DIAGNOSIS — J9601 Acute respiratory failure with hypoxia: Secondary | ICD-10-CM | POA: Diagnosis not present

## 2019-08-02 DIAGNOSIS — Z6841 Body Mass Index (BMI) 40.0 and over, adult: Secondary | ICD-10-CM | POA: Diagnosis not present

## 2019-08-02 DIAGNOSIS — Z86010 Personal history of colonic polyps: Secondary | ICD-10-CM

## 2019-08-02 DIAGNOSIS — I10 Essential (primary) hypertension: Secondary | ICD-10-CM | POA: Diagnosis present

## 2019-08-02 DIAGNOSIS — D649 Anemia, unspecified: Secondary | ICD-10-CM | POA: Diagnosis present

## 2019-08-02 DIAGNOSIS — G4733 Obstructive sleep apnea (adult) (pediatric): Secondary | ICD-10-CM | POA: Diagnosis present

## 2019-08-02 DIAGNOSIS — D72810 Lymphocytopenia: Secondary | ICD-10-CM | POA: Diagnosis present

## 2019-08-02 LAB — CBC WITH DIFFERENTIAL/PLATELET
Abs Immature Granulocytes: 0.01 10*3/uL (ref 0.00–0.07)
Basophils Absolute: 0 10*3/uL (ref 0.0–0.1)
Basophils Relative: 0 %
Eosinophils Absolute: 0 10*3/uL (ref 0.0–0.5)
Eosinophils Relative: 2 %
HCT: 34.6 % — ABNORMAL LOW (ref 39.0–52.0)
Hemoglobin: 11.1 g/dL — ABNORMAL LOW (ref 13.0–17.0)
Immature Granulocytes: 1 %
Lymphocytes Relative: 24 %
Lymphs Abs: 0.5 10*3/uL — ABNORMAL LOW (ref 0.7–4.0)
MCH: 30 pg (ref 26.0–34.0)
MCHC: 32.1 g/dL (ref 30.0–36.0)
MCV: 93.5 fL (ref 80.0–100.0)
Monocytes Absolute: 0.3 10*3/uL (ref 0.1–1.0)
Monocytes Relative: 15 %
Neutro Abs: 1.2 10*3/uL — ABNORMAL LOW (ref 1.7–7.7)
Neutrophils Relative %: 58 %
Platelets: 143 10*3/uL — ABNORMAL LOW (ref 150–400)
RBC: 3.7 MIL/uL — ABNORMAL LOW (ref 4.22–5.81)
RDW: 14.6 % (ref 11.5–15.5)
WBC: 2 10*3/uL — ABNORMAL LOW (ref 4.0–10.5)
nRBC: 0 % (ref 0.0–0.2)

## 2019-08-02 LAB — BASIC METABOLIC PANEL
Anion gap: 8 (ref 5–15)
BUN: 12 mg/dL (ref 8–23)
CO2: 21 mmol/L — ABNORMAL LOW (ref 22–32)
Calcium: 8.1 mg/dL — ABNORMAL LOW (ref 8.9–10.3)
Chloride: 108 mmol/L (ref 98–111)
Creatinine, Ser: 1.22 mg/dL (ref 0.61–1.24)
GFR calc Af Amer: >60 mL/min (ref >60)
GFR calc non Af Amer: 56 mL/min — ABNORMAL LOW (ref >60)
Glucose, Bld: 111 mg/dL — ABNORMAL HIGH (ref 70–99)
Potassium: 4.2 mmol/L (ref 3.5–5.1)
Sodium: 137 mmol/L (ref 135–145)

## 2019-08-02 LAB — D-DIMER, QUANTITATIVE: D-Dimer, Quant: 0.41 ug/mL-FEU (ref 0.00–0.50)

## 2019-08-02 LAB — HEPATIC FUNCTION PANEL
ALT: 17 U/L (ref 0–44)
AST: 28 U/L (ref 15–41)
Albumin: 2.9 g/dL — ABNORMAL LOW (ref 3.5–5.0)
Alkaline Phosphatase: 48 U/L (ref 38–126)
Bilirubin, Direct: 0.2 mg/dL (ref 0.0–0.2)
Indirect Bilirubin: 0.7 mg/dL (ref 0.3–0.9)
Total Bilirubin: 0.9 mg/dL (ref 0.3–1.2)
Total Protein: 6.4 g/dL — ABNORMAL LOW (ref 6.5–8.1)

## 2019-08-02 LAB — CBC
HCT: 35.9 % — ABNORMAL LOW (ref 39.0–52.0)
Hemoglobin: 11.4 g/dL — ABNORMAL LOW (ref 13.0–17.0)
MCH: 29.7 pg (ref 26.0–34.0)
MCHC: 31.8 g/dL (ref 30.0–36.0)
MCV: 93.5 fL (ref 80.0–100.0)
Platelets: 146 10*3/uL — ABNORMAL LOW (ref 150–400)
RBC: 3.84 MIL/uL — ABNORMAL LOW (ref 4.22–5.81)
RDW: 14.6 % (ref 11.5–15.5)
WBC: 1.9 10*3/uL — ABNORMAL LOW (ref 4.0–10.5)
nRBC: 0 % (ref 0.0–0.2)

## 2019-08-02 LAB — FERRITIN: Ferritin: 196 ng/mL (ref 24–336)

## 2019-08-02 LAB — LACTIC ACID, PLASMA: Lactic Acid, Venous: 1.3 mmol/L (ref 0.5–1.9)

## 2019-08-02 LAB — TRIGLYCERIDES: Triglycerides: 57 mg/dL (ref <150)

## 2019-08-02 LAB — FIBRINOGEN: Fibrinogen: 436 mg/dL (ref 210–475)

## 2019-08-02 LAB — C-REACTIVE PROTEIN: CRP: 2.2 mg/dL — ABNORMAL HIGH (ref <1.0)

## 2019-08-02 LAB — LACTATE DEHYDROGENASE: LDH: 220 U/L — ABNORMAL HIGH (ref 98–192)

## 2019-08-02 MED ORDER — SODIUM CHLORIDE 0.9% FLUSH: 3.0000 mL | Freq: Once | INTRAVENOUS | Status: DC

## 2019-08-02 NOTE — ED Provider Notes (Signed)
[facility] EMERGENCY DEPARTMENT Provider Note   CSN: [id number] Arrival date & time: 08/02/19  1841     History   Chief Complaint Chief Complaint  Patient presents with  . Shortness of Breath  . Cough  . COVID +    HPI Patrick Bishop is a 78 y.o. male.     The history is provided by the patient and medical records. No language interpreter was used.  Shortness of Breath Associated symptoms: cough   Cough Associated symptoms: shortness of breath    Patrick Bishop is a 78 y.o. male who presents to the Emergency Department complaining of [place]. He presents to the emergency department at urgent by his PCP for evaluation of COVID19 infection. He was notified recently of a positive coronavirus test, tested on November 3. He has been experiencing fatigue, shortness of breath and nonproductive cough for the last few days. No fevers at home. He currently resides at [facility] independent living facility. He was recently discharged from a rehab facility one week ago. He does have an indwelling nephrectomy tube in place, last drink at 6 PM today. Symptoms are severe, constant, worsening. Past Medical History:  Diagnosis Date  . Chronic diastolic heart failure (HCC)    Normal LV Fxn by Echo 04/2017   . Chronic venous insufficiency 2015   s/p Bilateral Iliac & Femoral Stents (including IVC) - DEEP Venous Reflux.  Also s/p Bilateral GSV Ablation.  . Essential hypertension   . Hx of adenomatous colonic polyps   . Hyperlipidemia with target LDL less than 100   . Longstanding persistent atrial fibrillation ([place])   . Nephrolithiasis 05/2019  . Obesity   . OSA on CPAP    May need New CPAP machine & CPAP MD)  . Pulmonary hypertension ([place])   . Pyelonephritis 05/2019    Patient Active Problem List   Diagnosis Date Noted  . Suspected COVID-19 virus infection   . Shortness of breath 08/02/2019  . Leukopenia   . Heme + stool   . Decreased hemoglobin   .  Hydronephrosis with obstructing calculus 06/24/2019  . Cholelithiasis 06/24/2019  . Elevated troponin 06/24/2019  . Arthritis 07/12/2018  . Atrial fibrillation ([place]) 07/12/2018  . Chronic pain 07/12/2018  . Congestive heart failure ([place]) 07/12/2018  . Hearing loss 07/12/2018  . Lymphedema 07/12/2018  . Muscle weakness 07/12/2018  . Unsteady gait 07/12/2018  . Morbid obesity ([place]) 07/08/2018  . 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 Chronic diastolic HF (heart failure) ([place])   . Chronic venous insufficiency   . Essential hypertension   . Longstanding persistent atrial fibrillation: CHA2DSVasc = ~3 (age x 2, HTN)   . OSA on CPAP   . Pulmonary hypertension ([place])   . Hyperlipidemia with target LDL less than 100     Past Surgical History:  Procedure Laterality Date  . ABDOMINAL VENOUS DUPLEX Bilateral 07/2014   ([place]. Cardiology: Dr. Cleda Mccreedy): (prior to Bilateral Iliac-Femoral Venous Stent Placement): Patent IVC.  No DVT B/L Iliac or Com fem V..  +++ DEEP V REFLUX B/L ILIAC & FEM V.  Absent Superficial V Reflux B/L x L SSV (too tortuous for intervention).  BL GSV occluded - prior ablation.   . ABDOMINAL VENOUS DUPLEX Bilateral 11/2014   Absent DVT.  Left common femoral venous reflux: 1508 MS left common femoral vein, 2284MS left proximal femoral vein -> cystic structure noted in the left popliteal vein (7.6 x 1.4 x 3.8  cm).;  Right leg deep venous reflux noted in the distal iliac, common femoral, proximal profunda femoral, proximal superficial femoral, superficial femoral and popliteal vein.  [name] CARDIAC CATHETERIZATION  10/2009   [facility] Cardiology - Dr. 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  EF~45%.  Mild pulmonary pretension.  [name] CARDIAC PET  08/2009   Inferolateral partially reversible defect -- > false positive by cath  . COLONOSCOPY    . ESOPHAGOGASTRODUODENOSCOPY    . GREATER SAPHENOUS VEIN  ABLATION Bilateral Before 2015   [facility] (Dr. Eugenie Filler)  . IR NEPHROSTOMY PLACEMENT LEFT  06/24/2019  . IR TRANSCATH PLC STENT  EA ADD VEIN  INC ANGIOPLASTY Left 10/2014   [facility], Dr. 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 IR TRANSCATH PLC STENT  INITIAL VEIN  INC ANGIOPLASTY Right 08/2014   [facility] - Dr. Stormy Fabian): US Guided --> R Common & External Iliac, Common Femoral Venography with Placement of 24 mmx 70 mm Stent - R Com Iliac-Ext Iliac- & Com Fem V. reducing 60-80% venous compression to ~0%).;; also noted significant L-sided venous compression (staged stenting).   [name] NM MYOVIEW LTD  11/2005   Adenosine Myoview [facility] Cardiology - Dr. Cleda Mccreedy): Normal myocardial perfusion scan. No ischemia or infarction.  . TRANSTHORACIC ECHOCARDIOGRAM  04/26/2017    [facility] Cardiology - Dr. Cleda Mccreedy) - report not available.  Per clinic note: Normal global LV function. Mild concentric temperature. Mild TR. Mild pulmonary hypertension.;;   . TRANSTHORACIC ECHOCARDIOGRAM  7/'15; 7/'17   [facility] Cardiology - Dr. Saverio Danker) a) low normal LV function (EF 56%). Moderate concentric LVH. Mild LA dilation. Normal pulmonary pressures.  ;; b) normal global function - EF 60-65%. Moderate pulmonary hypertension. Biatrial enlargement. Moderate concentric LVH.  [name] TRANSTHORACIC ECHOCARDIOGRAM  02/2010   [facility] Cardiology - Dr. Saverio Danker): Normal global LV function. Mild LVH. EF 60%.; November 2010. Moderate LV dilation. Moderate LVH. Normal EF 50-55%. Mild to moderate pulmonary hypertension. (July 2012 showed normal function with mild LVH, mild MR noted in August 2014)        Home Medications    Prior to Admission medications   Medication Sig Start Date End Date Taking? Authorizing  Provider  atorvastatin (LIPITOR) 10 MG tablet TAKE 1 TABLET DAILY AT 6 P.M. Patient taking differently: Take 10 mg by mouth daily.  11/30/18   [name], MD  cephALEXin (KEFLEX) 500 MG capsule Take 1 capsule (500 mg total) by mouth every 12 (twelve) hours. 07/01/19   Geradine Girt, DO  furosemide (LASIX) 40 MG tablet Take  20 mg  ( 1/2 tablet) twice a day except on Monday and Thursday take 40 mg twice a day. Patient taking differently: Take 20-40 mg by mouth See admin instructions. Take  20 mg  ( 1/2 tablet) twice a day except on Monday and Thursday take 40 mg twice a day. 07/02/18   [name], MD  HYDROcodone-acetaminophen (NORCO/VICODIN) 5-325 MG tablet Take 1 tablet by mouth 2 (two) times daily as needed for moderate pain. 07/02/19   Geradine Girt, DO  magnesium oxide (MAG-OX) 400 (241.3 Mg) MG tablet Take 1 tablet (400 mg total) by mouth daily. 07/01/19   Geradine Girt,  DO  metoprolol succinate (TOPROL-XL) 25 MG 24 hr tablet Take 1 tablet (25 mg total) by mouth daily. 07/02/19   Geradine Girt, DO  pantoprazole (PROTONIX) 40 MG tablet Take 1 tablet (40 mg total) by mouth daily. 07/02/19   Eulogio Bear U, DO  XARELTO 20 MG TABS tablet TAKE 1 TABLET DAILY WITH SUPPER Patient taking differently: Take 20 mg by mouth daily with supper.  11/09/18   [name], MD    Family History Family History  Problem Relation Age of Onset  . Cancer Mother   . Heart attack Father 44  . Hypertension Brother   . Cancer Sister   . Other Neg Hx        He really does not know much about his parents and siblings health, but they did not speak about healthcare    Social History Social History   Tobacco Use  . Smoking status: Former Smoker    Packs/day: 1.00    Years: 35.00    Pack years: 35.00    Types: Cigarettes    Quit date: 1996    Years since quitting: 24.8  . Smokeless tobacco: Never Used  . Tobacco comment: smoked from age [age]   Substance Use Topics  . Alcohol use: No     Frequency: Never    Comment: Not any more  . Drug use: No     Allergies   Aldactone [spironolactone]   Review of Systems Review of Systems  Respiratory: Positive for cough and shortness of breath.      Physical Exam Updated Vital Signs BP 108/68   Pulse 75   Temp 99.2 F (37.3 C) (Oral)   Resp (!) 23   Ht 6\' 1"  (1.854 m)   Wt (!) 153.3 kg   SpO2 (!) 88%   BMI 44.59 kg/m   Physical Exam Vitals signs and nursing note reviewed.  Constitutional:      Appearance: He is well-developed.  HENT:     Head: Normocephalic and atraumatic.  Cardiovascular:     Rate and Rhythm: Normal rate and regular rhythm.  Pulmonary:     Effort: Pulmonary effort is normal. No respiratory distress.  Abdominal:     Palpations: Abdomen is soft.     Tenderness: There is no abdominal tenderness. There is no guarding or rebound.  Musculoskeletal:        General: No tenderness.     Comments: 2+DP pulses bilaterally. There chronic venous stasis changes to bilateral lower extremities with 3+ edema to bilateral lower extremities. The left flank has a number ostomy catheter in place. Surrounding areas without erythema or edema. The catheter bag has about 50 mL of dark urine.  Skin:    General: Skin is warm and dry.  Neurological:     Mental Status: He is alert and oriented to person, place, and time.  Psychiatric:        Behavior: Behavior normal.      ED Treatments / Results  Labs (all labs ordered are listed, but only abnormal results are displayed) Labs Reviewed  BASIC METABOLIC PANEL - Abnormal; Notable for the following components:      Result Value   CO2 21 (*)    Glucose, Bld 111 (*)    Calcium 8.1 (*)    GFR calc non Af Amer 56 (*)    All other components within normal limits  CBC - Abnormal; Notable for the following components:   WBC 1.9 (*)    RBC  3.84 (*)    Hemoglobin 11.4 (*)    HCT 35.9 (*)    Platelets 146 (*)    All other components within normal limits  LACTATE  DEHYDROGENASE - Abnormal; Notable for the following components:   LDH 220 (*)    All other components within normal limits  C-REACTIVE PROTEIN - Abnormal; Notable for the following components:   CRP 2.2 (*)    All other components within normal limits  CBC WITH DIFFERENTIAL/PLATELET - Abnormal; Notable for the following components:   WBC 2.0 (*)    RBC 3.70 (*)    Hemoglobin 11.1 (*)    HCT 34.6 (*)    Platelets 143 (*)    Neutro Abs 1.2 (*)    Lymphs Abs 0.5 (*)    All other components within normal limits  HEPATIC FUNCTION PANEL - Abnormal; Notable for the following components:   Total Protein 6.4 (*)    Albumin 2.9 (*)    All other components within normal limits  CULTURE, BLOOD (ROUTINE X 2)  CULTURE, BLOOD (ROUTINE X 2)  LACTIC ACID, PLASMA  D-DIMER, QUANTITATIVE (NOT AT [facility])  FERRITIN  TRIGLYCERIDES  FIBRINOGEN  LACTIC ACID, PLASMA  PROCALCITONIN    EKG EKG Interpretation  Date/Time:  Friday August 02 2019 21:06:54 EST Ventricular Rate:  64 PR Interval:    QRS Duration: 102 QT Interval:  403 QTC Calculation: 416 R Axis:   25 Text Interpretation: Sinus rhythm Low voltage, precordial leads Abnormal R-wave progression, early transition Confirmed by Quintella Reichert [phone]) on 08/02/2019 9:26:11 PM   Radiology Dg Chest Portable 1 View  Result Date: 08/02/2019 CLINICAL DATA:  Dyspnea, cough, COVID-19 EXAM: PORTABLE CHEST 1 VIEW COMPARISON:  06/25/2019 FINDINGS: Cardiomegaly. Unchanged right basilar scarring or atelectasis. No acute appearing airspace opacity. The visualized skeletal structures are unremarkable. IMPRESSION: 1.  Cardiomegaly. 2. Unchanged right basilar scarring or atelectasis. No acute appearing airspace opacity. Electronically Signed   By: Eddie Candle M.D.   On: 08/02/2019 21:00    Procedures Procedures (including critical care time)  Medications Ordered in ED Medications  sodium chloride flush (NS) 0.9 % injection 3 mL (has no administration  in time range)  HYDROcodone-acetaminophen (NORCO/VICODIN) 5-325 MG per tablet 1 tablet (has no administration in time range)  dexamethasone (DECADRON) injection 6 mg (has no administration in time range)  acetaminophen (TYLENOL) tablet 650 mg (has no administration in time range)  ondansetron (ZOFRAN) tablet 4 mg (has no administration in time range)    Or  ondansetron (ZOFRAN) injection 4 mg (has no administration in time range)     Initial Impression / Assessment and Plan / ED Course  I have reviewed the triage vital signs and the nursing notes.  Pertinent labs & imaging results that were available during my care of the patient were reviewed by me and considered in my medical decision making (see chart for details).        Patient here for evaluation of increased shortness of breath and fatigue, recently diagnosed with COVID-19. He is chronically ill appearing on evaluation but in no acute distress. He did develop some mild hypoxia in the emergency department and was treated with 2 L nasal cannula with rapid improvement in his oxygenation. Plan to admit to the hospital service for further treatment. Discussed the case with Dr. Myna Hidalgo, who will see the patient in the emergency department. Attempted to contact the patient's daughter for patient status update but there was no answer.  [name] was evaluated  in Emergency Department on 08/03/2019 for the symptoms described in the history of present illness. He was evaluated in the context of the global COVID-19 pandemic, which necessitated consideration that the patient might be at risk for infection with the SARS-CoV-2 virus that causes COVID-19. Institutional protocols and algorithms that pertain to the evaluation of patients at risk for COVID-19 are in a state of rapid change based on information released by regulatory bodies including the CDC and federal and state organizations. These policies and algorithms were followed during the  patient's care in the ED.   Final Clinical Impressions(s) / ED Diagnoses   Final diagnoses:  COVID-19 virus infection  Shortness of breath    ED Discharge Orders    None       Quintella Reichert, MD 08/03/19 [phone]

## 2019-08-02 NOTE — ED Triage Notes (Addendum)
Pt was discharged from a rehab facility one week ago for kidney stones, recent left nephrostomy placement. Pt reports facility contacted him stating he was exposed to covid + staff. Pt reports dyspnea with exertion, productive cough with white mucous, and fatigue the past few days. Pt was tested 11/3 and notified + for COVID. Pt denies any pain. Pt's doctor sent him here to get checked out. Pt lives at [facility], dropped off by daughter Patrick Bishop [phone]. Hx of Afib, pt takes Xarelto. No complications with nephrostomy.

## 2019-08-03 DIAGNOSIS — E785 Hyperlipidemia, unspecified: Secondary | ICD-10-CM | POA: Diagnosis present

## 2019-08-03 DIAGNOSIS — Z936 Other artificial openings of urinary tract status: Secondary | ICD-10-CM | POA: Diagnosis not present

## 2019-08-03 DIAGNOSIS — J189 Pneumonia, unspecified organism: Secondary | ICD-10-CM | POA: Diagnosis present

## 2019-08-03 DIAGNOSIS — Z87442 Personal history of urinary calculi: Secondary | ICD-10-CM | POA: Diagnosis not present

## 2019-08-03 DIAGNOSIS — D72810 Lymphocytopenia: Secondary | ICD-10-CM | POA: Diagnosis present

## 2019-08-03 DIAGNOSIS — U071 COVID-19: Principal | ICD-10-CM

## 2019-08-03 DIAGNOSIS — Z6841 Body Mass Index (BMI) 40.0 and over, adult: Secondary | ICD-10-CM | POA: Diagnosis not present

## 2019-08-03 DIAGNOSIS — R0602 Shortness of breath: Secondary | ICD-10-CM | POA: Diagnosis not present

## 2019-08-03 DIAGNOSIS — Z20828 Contact with and (suspected) exposure to other viral communicable diseases: Secondary | ICD-10-CM | POA: Diagnosis not present

## 2019-08-03 DIAGNOSIS — Z79891 Long term (current) use of opiate analgesic: Secondary | ICD-10-CM | POA: Diagnosis not present

## 2019-08-03 DIAGNOSIS — Z79899 Other long term (current) drug therapy: Secondary | ICD-10-CM | POA: Diagnosis not present

## 2019-08-03 DIAGNOSIS — Z4682 Encounter for fitting and adjustment of non-vascular catheter: Secondary | ICD-10-CM | POA: Diagnosis not present

## 2019-08-03 DIAGNOSIS — Z7901 Long term (current) use of anticoagulants: Secondary | ICD-10-CM | POA: Diagnosis not present

## 2019-08-03 DIAGNOSIS — Z960 Presence of urogenital implants: Secondary | ICD-10-CM | POA: Diagnosis not present

## 2019-08-03 DIAGNOSIS — I110 Hypertensive heart disease with heart failure: Secondary | ICD-10-CM | POA: Diagnosis present

## 2019-08-03 DIAGNOSIS — G4733 Obstructive sleep apnea (adult) (pediatric): Secondary | ICD-10-CM | POA: Diagnosis present

## 2019-08-03 DIAGNOSIS — N200 Calculus of kidney: Secondary | ICD-10-CM | POA: Diagnosis not present

## 2019-08-03 DIAGNOSIS — Z87891 Personal history of nicotine dependence: Secondary | ICD-10-CM | POA: Diagnosis not present

## 2019-08-03 DIAGNOSIS — J1289 Other viral pneumonia: Secondary | ICD-10-CM | POA: Diagnosis not present

## 2019-08-03 DIAGNOSIS — J9601 Acute respiratory failure with hypoxia: Secondary | ICD-10-CM | POA: Diagnosis present

## 2019-08-03 DIAGNOSIS — D649 Anemia, unspecified: Secondary | ICD-10-CM | POA: Diagnosis present

## 2019-08-03 DIAGNOSIS — I5032 Chronic diastolic (congestive) heart failure: Secondary | ICD-10-CM | POA: Diagnosis present

## 2019-08-03 DIAGNOSIS — I10 Essential (primary) hypertension: Secondary | ICD-10-CM | POA: Diagnosis not present

## 2019-08-03 DIAGNOSIS — N132 Hydronephrosis with renal and ureteral calculous obstruction: Secondary | ICD-10-CM | POA: Diagnosis present

## 2019-08-03 DIAGNOSIS — Z86010 Personal history of colonic polyps: Secondary | ICD-10-CM | POA: Diagnosis not present

## 2019-08-03 DIAGNOSIS — Z8249 Family history of ischemic heart disease and other diseases of the circulatory system: Secondary | ICD-10-CM | POA: Diagnosis not present

## 2019-08-03 DIAGNOSIS — D72819 Decreased white blood cell count, unspecified: Secondary | ICD-10-CM | POA: Diagnosis not present

## 2019-08-03 DIAGNOSIS — Z20822 Contact with and (suspected) exposure to covid-19: Secondary | ICD-10-CM | POA: Diagnosis present

## 2019-08-03 DIAGNOSIS — I872 Venous insufficiency (chronic) (peripheral): Secondary | ICD-10-CM | POA: Diagnosis present

## 2019-08-03 DIAGNOSIS — I4811 Longstanding persistent atrial fibrillation: Secondary | ICD-10-CM | POA: Diagnosis present

## 2019-08-03 LAB — CBC WITH DIFFERENTIAL/PLATELET
Abs Immature Granulocytes: 0.01 10*3/uL (ref 0.00–0.07)
Basophils Absolute: 0 10*3/uL (ref 0.0–0.1)
Basophils Relative: 0 %
Eosinophils Absolute: 0 10*3/uL (ref 0.0–0.5)
Eosinophils Relative: 0 %
HCT: 35.7 % — ABNORMAL LOW (ref 39.0–52.0)
Hemoglobin: 11.5 g/dL — ABNORMAL LOW (ref 13.0–17.0)
Immature Granulocytes: 1 %
Lymphocytes Relative: 17 %
Lymphs Abs: 0.3 10*3/uL — ABNORMAL LOW (ref 0.7–4.0)
MCH: 29.6 pg (ref 26.0–34.0)
MCHC: 32.2 g/dL (ref 30.0–36.0)
MCV: 92 fL (ref 80.0–100.0)
Monocytes Absolute: 0.1 10*3/uL (ref 0.1–1.0)
Monocytes Relative: 8 %
Neutro Abs: 1.1 10*3/uL — ABNORMAL LOW (ref 1.7–7.7)
Neutrophils Relative %: 74 %
Platelets: 125 10*3/uL — ABNORMAL LOW (ref 150–400)
RBC: 3.88 MIL/uL — ABNORMAL LOW (ref 4.22–5.81)
RDW: 14.5 % (ref 11.5–15.5)
WBC: 1.4 10*3/uL — CL (ref 4.0–10.5)
nRBC: 0 % (ref 0.0–0.2)

## 2019-08-03 LAB — FERRITIN: Ferritin: 173 ng/mL (ref 24–336)

## 2019-08-03 LAB — COMPREHENSIVE METABOLIC PANEL
ALT: 15 U/L (ref 0–44)
AST: 25 U/L (ref 15–41)
Albumin: 2.7 g/dL — ABNORMAL LOW (ref 3.5–5.0)
Alkaline Phosphatase: 53 U/L (ref 38–126)
Anion gap: 8 (ref 5–15)
BUN: 13 mg/dL (ref 8–23)
CO2: 20 mmol/L — ABNORMAL LOW (ref 22–32)
Calcium: 8 mg/dL — ABNORMAL LOW (ref 8.9–10.3)
Chloride: 110 mmol/L (ref 98–111)
Creatinine, Ser: 1.07 mg/dL (ref 0.61–1.24)
GFR calc Af Amer: >60 mL/min (ref >60)
GFR calc non Af Amer: >60 mL/min (ref >60)
Glucose, Bld: 144 mg/dL — ABNORMAL HIGH (ref 70–99)
Potassium: 4.5 mmol/L (ref 3.5–5.1)
Sodium: 138 mmol/L (ref 135–145)
Total Bilirubin: 0.7 mg/dL (ref 0.3–1.2)
Total Protein: 6.1 g/dL — ABNORMAL LOW (ref 6.5–8.1)

## 2019-08-03 LAB — PROCALCITONIN
Procalcitonin: <0.1 ng/mL
Procalcitonin: <0.1 ng/mL

## 2019-08-03 LAB — MAGNESIUM: Magnesium: 1.8 mg/dL (ref 1.7–2.4)

## 2019-08-03 LAB — D-DIMER, QUANTITATIVE: D-Dimer, Quant: 0.49 ug/mL-FEU (ref 0.00–0.50)

## 2019-08-03 LAB — ABO/RH: ABO/RH(D): O POS

## 2019-08-03 LAB — C-REACTIVE PROTEIN: CRP: 2.2 mg/dL — ABNORMAL HIGH (ref <1.0)

## 2019-08-03 LAB — PHOSPHORUS: Phosphorus: 2.9 mg/dL (ref 2.5–4.6)

## 2019-08-03 LAB — SARS CORONAVIRUS 2 (TAT 6-24 HRS): SARS Coronavirus 2: POSITIVE — AB

## 2019-08-03 MED ORDER — FUROSEMIDE 20 MG PO TABS: 20.0000 mg | ORAL_TABLET | ORAL | Status: DC

## 2019-08-03 MED ORDER — ATORVASTATIN CALCIUM 10 MG PO TABS
10.0000 mg | ORAL_TABLET | Freq: Every day | ORAL | Status: DC
  Administered 2019-08-03 – 2019-08-08 (×6): 10 mg via ORAL
  Filled 2019-08-03 (×6): qty 1

## 2019-08-03 MED ORDER — FUROSEMIDE 20 MG PO TABS
40.0000 mg | ORAL_TABLET | ORAL | Status: DC
  Administered 2019-08-05: 40 mg via ORAL
  Filled 2019-08-03: qty 1
  Filled 2019-08-03: qty 2

## 2019-08-03 MED ORDER — SODIUM CHLORIDE 0.9 % IV SOLN: 250.0000 mL | INTRAVENOUS | Status: DC | PRN

## 2019-08-03 MED ORDER — DEXAMETHASONE SODIUM PHOSPHATE 10 MG/ML IJ SOLN
6.0000 mg | Freq: Every day | INTRAMUSCULAR | Status: DC
  Administered 2019-08-03 – 2019-08-09 (×8): 6 mg via INTRAVENOUS
  Filled 2019-08-03 (×8): qty 1

## 2019-08-03 MED ORDER — ONDANSETRON HCL 4 MG PO TABS: 4.0000 mg | ORAL_TABLET | Freq: Four times a day (QID) | ORAL | Status: DC | PRN

## 2019-08-03 MED ORDER — SODIUM CHLORIDE 0.9% FLUSH
3.0000 mL | Freq: Two times a day (BID) | INTRAVENOUS | Status: DC
  Administered 2019-08-03 – 2019-08-09 (×13): 3 mL via INTRAVENOUS

## 2019-08-03 MED ORDER — SODIUM CHLORIDE 0.9% FLUSH: 3.0000 mL | INTRAVENOUS | Status: DC | PRN

## 2019-08-03 MED ORDER — METOPROLOL SUCCINATE ER 25 MG PO TB24
25.0000 mg | ORAL_TABLET | Freq: Every day | ORAL | Status: DC
  Administered 2019-08-03 – 2019-08-08 (×6): 25 mg via ORAL
  Filled 2019-08-03 (×6): qty 1

## 2019-08-03 MED ORDER — PANTOPRAZOLE SODIUM 40 MG PO TBEC
40.0000 mg | DELAYED_RELEASE_TABLET | Freq: Every day | ORAL | Status: DC
  Administered 2019-08-03 – 2019-08-09 (×7): 40 mg via ORAL
  Filled 2019-08-03 (×7): qty 1

## 2019-08-03 MED ORDER — ACETAMINOPHEN 325 MG PO TABS: 650.0000 mg | ORAL_TABLET | Freq: Four times a day (QID) | ORAL | Status: DC | PRN

## 2019-08-03 MED ORDER — ONDANSETRON HCL 4 MG/2ML IJ SOLN: 4.0000 mg | Freq: Four times a day (QID) | INTRAMUSCULAR | Status: DC | PRN

## 2019-08-03 MED ORDER — RIVAROXABAN 20 MG PO TABS
20.0000 mg | ORAL_TABLET | Freq: Every day | ORAL | Status: DC
  Administered 2019-08-03 – 2019-08-08 (×6): 20 mg via ORAL
  Filled 2019-08-03 (×10): qty 1

## 2019-08-03 MED ORDER — FUROSEMIDE 20 MG PO TABS
20.0000 mg | ORAL_TABLET | ORAL | Status: DC
  Administered 2019-08-03 – 2019-08-04 (×4): 20 mg via ORAL
  Filled 2019-08-03 (×3): qty 1

## 2019-08-03 MED ORDER — HYDROCODONE-ACETAMINOPHEN 5-325 MG PO TABS: 1.0000 | ORAL_TABLET | Freq: Two times a day (BID) | ORAL | Status: DC | PRN

## 2019-08-03 NOTE — Progress Notes (Signed)
PROGRESS NOTE    Rihaan Barrack  ZJQ:[id number] DOB: 08-30-1941 DOA: 08/02/2019 PCP: Lujean Amel, MD    Brief Narrative:  78 y.o. 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  Patient was noted to have increased work of breathing today at his independent living facility, reports that he has felt short of breath with exertion for more than a month now, reports a mild cough that has developed more recently, and denies subjective fevers or chills.  He was admitted in late September with septic shock secondary to urinary source, had obstructing left UPJ stone that was addressed with percutaneous nephrostomy tube, and he was discharged to an SNF where he was reportedly exposed to COVID-19.  He reports testing positive for COVID-19 on 07/30/2019 but does not have these results with him and they are not visible in his electronic medical record.  He believes that his daughter has documentation of the positive result but she is not answering her phone.  Patient denies any chest pain.  Reports that his chronic leg swelling seems to be unchanged.  Denies any flank pain or dysuria.  ED Course: Upon arrival to the ED, patient is found to be afebrile, saturating 88% on room air, mildly tachypneic, and with stable blood pressure.  EKG features a sinus rhythm and chest x-ray is negative for acute findings.  Chemistry panel features and albumin of 2.9 and protein of 6.4.  There is a lymphocytopenia and neutropenia on CBC.  Lactic acid is normal.  D-dimer is normal.  CRP elevated to 2.2.  Blood cultures were collected in the emergency department and the patient was started on 2 L/min of supplemental oxygen.  Assessment & Plan:   Principal Problem:   Shortness of breath Active Problems:  Chronic diastolic HF (heart failure) (HCC)   Essential hypertension   Longstanding persistent atrial fibrillation: CHA2DSVasc = ~3 (age x 2, HTN)   Hydronephrosis with obstructing calculus   Leukopenia   Suspected COVID-19 virus infection  1. Suspected COVID-19 infection; acute hypoxic respiratory failure  - Presents from his ILF where he was noted to have increased WOB, reportedly outside test from facility returned positive -In-house covid test obtained overnight, penidng - In ED, pt noted to have O2 sats as low as 88%, started on steroids - No acute findings on CXR; d-dimer is normal -F/u COVID test. If pos, then plan transfer to [place]   2. Leukopenia  - WBC is 2000 on admission with ANC 1160 and ALC 480  - Likely secondary to viral infection -Will repeat CBC in AM  3. Chronic diastolic CHF  - Appears compensated at this time - Continue Lasix and beta-blocker   4. Atrial fibrillation  - In sinus rhythm on admission  - CHADS-VASc is 14 (age x2, CHF) - For now, will continue Xarelto and metoprolol    5. Hydronephrosis s/p nephrostomy tube  - Patient was admitted with septic shock secondary to urinary source in late September, was found to have 1 cm left UPJ stone with hydronephrosis, and had percutaneous nephrostomy tube placed by IR on 9/28  - Nephrostomy appears patent and renal function preserved on admission  - He is followed by urology and planned for upcoming cystoscopy with laser lithotripsy and stent placement    DVT prophylaxis: Xarelto Code Status: Full Family Communication: Pt in  room, family not at bedside Disposition Plan: Uncertain at this time  Consultants:     Procedures:     Antimicrobials: Anti-infectives (From admission, onward)   None       Subjective: Reports feeling better today  Objective: Vitals:   08/03/19 0800 08/03/19 0900 08/03/19 1000 08/03/19 1100  BP: (!) 126/59 110/61 122/72 (!) 114/57  Pulse: 80 80  74  Resp: (!) 21 (!)  21 (!) 25 (!) 27  Temp:    99.2 F (37.3 C)  TempSrc:    Oral  SpO2: 94% 93%  94%  Weight:    (!) 153.3 kg  Height:    6\' 1"  (1.854 m)   No intake or output data in the 24 hours ending 08/03/19 1215 Filed Weights   08/02/19 1857 08/03/19 1100  Weight: (!) 153.3 kg (!) 153.3 kg    Examination:  General exam: Appears calm and comfortable  Respiratory system: no audible wheezing. Respiratory effort normal. Cardiovascular system: perfused, regular Gastrointestinal system: Abdomen is nondistended, soft and nontender. No organomegaly or masses felt. Normal bowel sounds heard. Central nervous system: Alert and oriented. No focal neurological deficits. Extremities: Symmetric 5 x 5 power. Skin: No rashes, lesions Psychiatry: Judgement and insight appear normal. Mood & affect appropriate.   Data Reviewed: I have personally reviewed following labs and imaging studies  CBC: Recent Labs  Lab 08/02/19 1916 08/02/19 2155 08/03/19 0535  WBC 1.9* 2.0* 1.4*  NEUTROABS  --  1.2* 1.1*  HGB 11.4* 11.1* 11.5*  HCT 35.9* 34.6* 35.7*  MCV 93.5 93.5 92.0  PLT 146* 143* 539*   Basic Metabolic Panel: Recent Labs  Lab 08/02/19 1916 08/03/19 0535  NA 137 138  K 4.2 4.5  CL 108 110  CO2 21* 20*  GLUCOSE 111* 144*  BUN 12 13  CREATININE 1.22 1.07  CALCIUM 8.1* 8.0*  MG  --  1.8  PHOS  --  2.9   GFR: Estimated Creatinine Clearance: 86.5 mL/min (by C-G formula based on SCr of 1.07 mg/dL). Liver Function Tests: Recent Labs  Lab 08/02/19 2155 08/03/19 0535  AST 28 25  ALT 17 15  ALKPHOS 48 53  BILITOT 0.9 0.7  PROT 6.4* 6.1*  ALBUMIN 2.9* 2.7*   No results for input(s): LIPASE, AMYLASE in the last 168 hours. No results for input(s): AMMONIA in the last 168 hours. Coagulation Profile: No results for input(s): INR, PROTIME in the last 168 hours. Cardiac Enzymes: No results for input(s): CKTOTAL, CKMB, CKMBINDEX, TROPONINI in the last 168 hours. BNP (last 3 results) No results  for input(s): PROBNP in the last 8760 hours. HbA1C: No results for input(s): HGBA1C in the last 72 hours. CBG: No results for input(s): GLUCAP in the last 168 hours. Lipid Profile: Recent Labs    08/02/19 2158  TRIG 57   Thyroid Function Tests: No results for input(s): TSH, T4TOTAL, FREET4, T3FREE, THYROIDAB in the last 72 hours. Anemia Panel: Recent Labs    08/02/19 2155 08/03/19 0535  FERRITIN 196 173   Sepsis Labs: Recent Labs  Lab 08/02/19 2155 08/02/19 2158 08/03/19 0535  PROCALCITON <0.10  --  <0.10  LATICACIDVEN  --  1.3  --     Recent Results (from the past 240 hour(s))  Blood Culture (routine x 2)     Status: None (Preliminary result)   Collection Time: 08/02/19  9:58 PM   Specimen: BLOOD RIGHT FOREARM  Result Value Ref Range Status   Specimen Description BLOOD RIGHT FOREARM  Final   Special Requests   Final    BOTTLES DRAWN AEROBIC AND ANAEROBIC Blood Culture adequate volume   Culture   Final    NO GROWTH < 12 HOURS Performed at [facility], [address], [place], [place] [address]    Report Status PENDING  Incomplete     Radiology Studies: Dg Chest Portable 1 View  Result Date: 08/02/2019 CLINICAL DATA:  Dyspnea, cough, COVID-19 EXAM: PORTABLE CHEST 1 VIEW COMPARISON:  06/25/2019 FINDINGS: Cardiomegaly. Unchanged right basilar scarring or atelectasis. No acute appearing airspace opacity. The visualized skeletal structures are unremarkable. IMPRESSION: 1.  Cardiomegaly. 2. Unchanged right basilar scarring or atelectasis. No acute appearing airspace opacity. Electronically Signed   By: Eddie Candle M.D.   On: 08/02/2019 21:00    Scheduled Meds: . atorvastatin  10 mg Oral q1800  . dexamethasone (DECADRON) injection  6 mg Intravenous Daily  . furosemide  20-40 mg Oral See admin instructions  . metoprolol succinate  25 mg Oral Daily  . pantoprazole  40 mg Oral Daily  . rivaroxaban  20 mg Oral Q supper  . sodium chloride flush  3 mL Intravenous  Once  . sodium chloride flush  3 mL Intravenous Q12H   Continuous Infusions: . sodium chloride       LOS: 0 days   Marylu Lund, MD [facility] Pager On Amion  If 7PM-7AM, please contact night-coverage 08/03/2019, 12:15 PM

## 2019-08-03 NOTE — ED Notes (Signed)
Attempted report, Advertising account executive advised RN for room 31 is in a COVID room and will call back.

## 2019-08-03 NOTE — ED Notes (Signed)
+  Covid   Breakfast ordered  

## 2019-08-03 NOTE — ED Notes (Signed)
ED TO INPATIENT HANDOFF REPORT  ED Nurse Name and Phone #: Ramond Dial [phone]  S Name/Age/Gender Patrick Bishop 78 y.o. male Room/Bed: 025C/025C  Code Status   Code Status: Full Code  Home/SNF/Other Home Patient oriented to: self, place, time and situation Is this baseline? Yes   Triage Complete: Triage complete  Chief Complaint COVID +  sob  fatigue  Triage Note Pt was discharged from a rehab facility one week ago for kidney stones, recent left nephrostomy placement. Pt reports facility contacted him stating he was exposed to covid + staff. Pt reports dyspnea with exertion, productive cough with white mucous, and fatigue the past few days. Pt was tested 11/3 and notified + for COVID. Pt denies any pain. Pt's doctor sent him here to get checked out. Pt lives at [facility], dropped off by daughter Patrick Bishop [phone]. Hx of Afib, pt takes Xarelto. No complications with nephrostomy.    Allergies Allergies  Allergen Reactions  . Aldactone [Spironolactone]     - unknown     Level of Care/Admitting Diagnosis ED Disposition    ED Disposition Condition [facility] Area: [place] [100100]  Level of Care: Telemetry Medical [104]  I expect the patient will be discharged within 24 hours: Yes  LOW acuity---Tx typically complete <24 hrs---ACUTE conditions typically can be evaluated <24 hours---LABS likely to return to acceptable levels <24 hours---IS near functional baseline---EXPECTED to return to current living arrangement---NOT newly hypoxic: Does not meet criteria for 5C-Observation unit  Covid Evaluation: Person Under Investigation (PUI)  Diagnosis: Shortness of breath [786.05.ICD-9-CM]  Admitting Physician: Vianne Bulls [[id number]]  Attending Physician: Vianne Bulls [[id number]]  PT Class (Do Not Modify): Observation [104]  PT Acc Code (Do Not Modify): Observation [10022]       B Medical/Surgery History Past Medical History:   Diagnosis Date  . Chronic diastolic heart failure (HCC)    Normal LV Fxn by Echo 04/2017   . Chronic venous insufficiency 2015   s/p Bilateral Iliac & Femoral Stents (including IVC) - DEEP Venous Reflux.  Also s/p Bilateral GSV Ablation.  . Essential hypertension   . Hx of adenomatous colonic polyps   . Hyperlipidemia with target LDL less than 100   . Longstanding persistent atrial fibrillation ([place])   . Nephrolithiasis 05/2019  . Obesity   . OSA on CPAP    May need New CPAP machine & CPAP MD)  . Pulmonary hypertension ([place])   . Pyelonephritis 05/2019   Past Surgical History:  Procedure Laterality Date  . ABDOMINAL VENOUS DUPLEX Bilateral 07/2014   ([place]iology: Dr. Cleda Mccreedy): (prior to Bilateral Iliac-Femoral Venous Stent Placement): Patent IVC.  No DVT B/L Iliac or Com fem V..  +++ DEEP V REFLUX B/L ILIAC & FEM V.  Absent Superficial V Reflux B/L x L SSV (too tortuous for intervention).  BL GSV occluded - prior ablation.   . ABDOMINAL VENOUS DUPLEX Bilateral 11/2014   Absent DVT.  Left common femoral venous reflux: 1508 MS left common femoral vein, 2284MS left proximal femoral vein -> cystic structure noted in the left popliteal vein (7.6 x 1.4 x 3.8 cm).;  Right leg deep venous reflux noted in the distal iliac, common femoral, proximal profunda femoral, proximal superficial femoral, superficial femoral and popliteal vein.  [name] CARDIAC CATHETERIZATION  10/2009   [facility] Cardiology - Dr. 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  EF~45%.  Mild pulmonary  pretension.  [name] CARDIAC PET  08/2009   Inferolateral partially reversible defect -- > false positive by cath  . COLONOSCOPY    . ESOPHAGOGASTRODUODENOSCOPY    . GREATER SAPHENOUS VEIN ABLATION Bilateral Before 2015   [facility] (Dr. Eugenie Filler)  . IR NEPHROSTOMY PLACEMENT LEFT  06/24/2019  . IR TRANSCATH PLC STENT  EA ADD VEIN  INC ANGIOPLASTY Left 10/2014   [facility], Dr.  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 IR TRANSCATH PLC STENT  INITIAL VEIN  INC ANGIOPLASTY Right 08/2014   [facility] - Dr. Stormy Fabian): US Guided --> R Common & External Iliac, Common Femoral Venography with Placement of 24 mmx 70 mm Stent - R Com Iliac-Ext Iliac- & Com Fem V. reducing 60-80% venous compression to ~0%).;; also noted significant L-sided venous compression (staged stenting).   [name] NM MYOVIEW LTD  11/2005   Adenosine Myoview [facility] Cardiology - Dr. Cleda Mccreedy): Normal myocardial perfusion scan. No ischemia or infarction.  . TRANSTHORACIC ECHOCARDIOGRAM  04/26/2017    [facility] Cardiology - Dr. Cleda Mccreedy) - report not available.  Per clinic note: Normal global LV function. Mild concentric temperature. Mild TR. Mild pulmonary hypertension.;;   . TRANSTHORACIC ECHOCARDIOGRAM  7/'15; 7/'17   [facility]iology - Dr. Saverio Danker) a) low normal LV function (EF 56%). Moderate concentric LVH. Mild LA dilation. Normal pulmonary pressures.  ;; b) normal global function - EF 60-65%. Moderate pulmonary hypertension. Biatrial enlargement. Moderate concentric LVH.  [name] TRANSTHORACIC ECHOCARDIOGRAM  02/2010   [facility] Cardiology - Dr. Saverio Danker): Normal global LV function. Mild LVH. EF 60%.; November 2010. Moderate LV dilation. Moderate LVH. Normal EF 50-55%. Mild to moderate pulmonary hypertension. (July 2012 showed normal function with mild LVH, mild MR noted in August 2014)     A IV Location/Drains/Wounds Patient Lines/Drains/Airways Status   Active Line/Drains/Airways    Name:   Placement date:   Placement time:   Site:   Days:   Peripheral IV 08/02/19 Right Forearm   08/02/19    2204    Forearm   1   Nephrostomy Left 10.2 Fr.   06/24/19    1734    Left   40   External Urinary  Catheter   06/25/19    1900    -   39          Intake/Output Last 24 hours No intake or output data in the 24 hours ending 08/03/19 1120  Labs/Imaging Results for orders placed or performed during the hospital encounter of 08/02/19 (from the past 48 hour(s))  Basic metabolic panel     Status: Abnormal   Collection Time: 08/02/19  7:16 PM  Result Value Ref Range   Sodium 137 135 - 145 mmol/L   Potassium 4.2 3.5 - 5.1 mmol/L   Chloride 108 98 - 111 mmol/L   CO2 21 (L) 22 - 32 mmol/L   Glucose, Bld 111 (H) 70 - 99 mg/dL   BUN 12 8 - 23 mg/dL   Creatinine, Ser 1.22 0.61 - 1.24 mg/dL   Calcium 8.1 (L) 8.9 - 10.3 mg/dL   GFR calc non Af Amer 56 (L) >60 mL/min   GFR calc Af Amer >60 >60 mL/min   Anion gap 8 5 - 15  Comment: Performed at [facility], [place] [address], [place], [place] [address]  CBC     Status: Abnormal   Collection Time: 08/02/19  7:16 PM  Result Value Ref Range   WBC 1.9 (L) 4.0 - 10.5 K/uL   RBC 3.84 (L) 4.22 - 5.81 MIL/uL   Hemoglobin 11.4 (L) 13.0 - 17.0 g/dL   HCT 35.9 (L) 39.0 - 52.0 %   MCV 93.5 80.0 - 100.0 fL   MCH 29.7 26.0 - 34.0 pg   MCHC 31.8 30.0 - 36.0 g/dL   RDW 14.6 11.5 - 15.5 %   Platelets 146 (L) 150 - 400 K/uL   nRBC 0.0 0.0 - 0.2 %    Comment: Performed at [facility], [place] [address], [place], [place] [address]  D-dimer, quantitative     Status: None   Collection Time: 08/02/19  9:55 PM  Result Value Ref Range   D-Dimer, Quant 0.41 0.00 - 0.50 ug/mL-FEU    Comment: (NOTE) At the manufacturer cut-off of 0.50 ug/mL FEU, this assay has been documented to exclude PE with a sensitivity and negative predictive value of 97 to 99%.  At this time, this assay has not been approved by the FDA to exclude DVT/VTE. Results should be correlated with clinical presentation. Performed at [facility], [place] [address] [place], [place] [address]   Procalcitonin     Status: None   Collection Time: 08/02/19  9:55 PM  Result Value  Ref Range   Procalcitonin <0.10 ng/mL    Comment:        Interpretation: PCT (Procalcitonin) <= 0.5 ng/mL: Systemic infection (sepsis) is not likely. Local bacterial infection is possible. (NOTE)       Sepsis PCT Algorithm           Lower Respiratory Tract                                      Infection PCT Algorithm    ----------------------------     ----------------------------         PCT < 0.25 ng/mL                PCT < 0.10 ng/mL         Strongly encourage             Strongly discourage   discontinuation of antibiotics    initiation of antibiotics    ----------------------------     -----------------------------       PCT 0.25 - 0.50 ng/mL            PCT 0.10 - 0.25 ng/mL               OR       >80% decrease in PCT            Discourage initiation of                                            antibiotics      Encourage discontinuation           of antibiotics    ----------------------------     -----------------------------         PCT >= 0.50 ng/mL  PCT 0.26 - 0.50 ng/mL               AND        <80% decrease in PCT             Encourage initiation of                                             antibiotics       Encourage continuation           of antibiotics    ----------------------------     -----------------------------        PCT >= 0.50 ng/mL                  PCT > 0.50 ng/mL               AND         increase in PCT                  Strongly encourage                                      initiation of antibiotics    Strongly encourage escalation           of antibiotics                                     -----------------------------                                           PCT <= 0.25 ng/mL                                                 OR                                        > 80% decrease in PCT                                     Discontinue / Do not initiate                                             antibiotics Performed at [facility], [address] [place], [place] [address]   Lactate dehydrogenase     Status: Abnormal   Collection Time: 08/02/19  9:55 PM  Result Value Ref Range   LDH 220 (H) 98 - 192 U/L    Comment: Performed at [facility], [place] [address] [place], [place] [address]  Ferritin     Status: None   Collection Time: 08/02/19  9:55 PM  Result Value Ref  Range   Ferritin 196 24 - 336 ng/mL    Comment: Performed at [facility] [address] [place], [place] [address]  Fibrinogen     Status: None   Collection Time: 08/02/19  9:55 PM  Result Value Ref Range   Fibrinogen 436 210 - 475 mg/dL    Comment: Performed at [facility] [address], [place], [place] [address]  C-reactive protein     Status: Abnormal   Collection Time: 08/02/19  9:55 PM  Result Value Ref Range   CRP 2.2 (H) <1.0 mg/dL    Comment: Performed at [facility], [place] [address], [place], [place] [address]  CBC WITH DIFFERENTIAL     Status: Abnormal   Collection Time: 08/02/19  9:55 PM  Result Value Ref Range   WBC 2.0 (L) 4.0 - 10.5 K/uL   RBC 3.70 (L) 4.22 - 5.81 MIL/uL   Hemoglobin 11.1 (L) 13.0 - 17.0 g/dL   HCT 34.6 (L) 39.0 - 52.0 %   MCV 93.5 80.0 - 100.0 fL   MCH 30.0 26.0 - 34.0 pg   MCHC 32.1 30.0 - 36.0 g/dL   RDW 14.6 11.5 - 15.5 %   Platelets 143 (L) 150 - 400 K/uL   nRBC 0.0 0.0 - 0.2 %   Neutrophils Relative % 58 %   Neutro Abs 1.2 (L) 1.7 - 7.7 K/uL   Lymphocytes Relative 24 %   Lymphs Abs 0.5 (L) 0.7 - 4.0 K/uL   Monocytes Relative 15 %   Monocytes Absolute 0.3 0.1 - 1.0 K/uL   Eosinophils Relative 2 %   Eosinophils Absolute 0.0 0.0 - 0.5 K/uL   Basophils Relative 0 %   Basophils Absolute 0.0 0.0 - 0.1 K/uL   Immature Granulocytes 1 %   Abs Immature Granulocytes 0.01 0.00 - 0.07 K/uL    Comment: Performed at [facility], [place] [address] [place], [place] [address]  Hepatic function panel     Status: Abnormal   Collection Time: 08/02/19  9:55 PM  Result Value  Ref Range   Total Protein 6.4 (L) 6.5 - 8.1 g/dL   Albumin 2.9 (L) 3.5 - 5.0 g/dL   AST 28 15 - 41 U/L   ALT 17 0 - 44 U/L   Alkaline Phosphatase 48 38 - 126 U/L   Total Bilirubin 0.9 0.3 - 1.2 mg/dL   Bilirubin, Direct 0.2 0.0 - 0.2 mg/dL   Indirect Bilirubin 0.7 0.3 - 0.9 mg/dL    Comment: Performed at [facility] [address], [place], [place] [address]  Lactic acid, plasma     Status: None   Collection Time: 08/02/19  9:58 PM  Result Value Ref Range   Lactic Acid, Venous 1.3 0.5 - 1.9 mmol/L    Comment: Performed at [place] [address], [place], [place] [address]  Blood Culture (routine x 2)     Status: None (Preliminary result)   Collection Time: 08/02/19  9:58 PM   Specimen: BLOOD RIGHT FOREARM  Result Value Ref Range   Specimen Description BLOOD RIGHT FOREARM    Special Requests      BOTTLES DRAWN AEROBIC AND ANAEROBIC Blood Culture adequate volume   Culture      NO GROWTH < 12 HOURS Performed at [facility], [place] [address], [place], [place] [address]    Report Status PENDING   Triglycerides     Status: None  Collection Time: 08/02/19  9:58 PM  Result Value Ref Range   Triglycerides 57 <150 mg/dL    Comment: Performed at [facility], [place] [address] [place], [place] [address]  ABO/Rh     Status: None   Collection Time: 08/03/19  5:00 AM  Result Value Ref Range   ABO/RH(D)      O POS Performed at [facility] [address] [place], [place] [address]   Procalcitonin     Status: None   Collection Time: 08/03/19  5:35 AM  Result Value Ref Range   Procalcitonin <0.10 ng/mL    Comment:        Interpretation: PCT (Procalcitonin) <= 0.5 ng/mL: Systemic infection (sepsis) is not likely. Local bacterial infection is possible. (NOTE)       Sepsis PCT Algorithm           Lower Respiratory Tract                                      Infection PCT Algorithm    ----------------------------     ----------------------------          PCT < 0.25 ng/mL                PCT < 0.10 ng/mL         Strongly encourage             Strongly discourage   discontinuation of antibiotics    initiation of antibiotics    ----------------------------     -----------------------------       PCT 0.25 - 0.50 ng/mL            PCT 0.10 - 0.25 ng/mL               OR       >80% decrease in PCT            Discourage initiation of                                            antibiotics      Encourage discontinuation           of antibiotics    ----------------------------     -----------------------------         PCT >= 0.50 ng/mL              PCT 0.26 - 0.50 ng/mL               AND        <80% decrease in PCT             Encourage initiation of                                             antibiotics       Encourage continuation           of antibiotics    ----------------------------     -----------------------------        PCT >= 0.50 ng/mL                  PCT > 0.50 ng/mL  AND         increase in PCT                  Strongly encourage                                      initiation of antibiotics    Strongly encourage escalation           of antibiotics                                     -----------------------------                                           PCT <= 0.25 ng/mL                                                 OR                                        > 80% decrease in PCT                                     Discontinue / Do not initiate                                             antibiotics Performed at [facility], [address], [place], [place] [address]   CBC with Differential/Platelet     Status: Abnormal   Collection Time: 08/03/19  5:35 AM  Result Value Ref Range   WBC 1.4 (LL) 4.0 - 10.5 K/uL    Comment: REPEATED TO VERIFY WHITE COUNT CONFIRMED ON SMEAR THIS CRITICAL RESULT HAS VERIFIED AND BEEN CALLED TO BECCA Jamar Weatherall RN. BY TAMEECO CALDWELL ON 11 07 2020 AT 4010, AND HAS BEEN READ  BACK.     RBC 3.88 (L) 4.22 - 5.81 MIL/uL   Hemoglobin 11.5 (L) 13.0 - 17.0 g/dL   HCT 35.7 (L) 39.0 - 52.0 %   MCV 92.0 80.0 - 100.0 fL   MCH 29.6 26.0 - 34.0 pg   MCHC 32.2 30.0 - 36.0 g/dL   RDW 14.5 11.5 - 15.5 %   Platelets 125 (L) 150 - 400 K/uL   nRBC 0.0 0.0 - 0.2 %   Neutrophils Relative % 74 %   Neutro Abs 1.1 (L) 1.7 - 7.7 K/uL   Lymphocytes Relative 17 %   Lymphs Abs 0.3 (L) 0.7 - 4.0 K/uL   Monocytes Relative 8 %   Monocytes Absolute 0.1 0.1 - 1.0 K/uL   Eosinophils Relative 0 %   Eosinophils Absolute 0.0 0.0 - 0.5 K/uL   Basophils Relative 0 %   Basophils Absolute 0.0 0.0 - 0.1 K/uL   Immature Granulocytes 1 %  Abs Immature Granulocytes 0.01 0.00 - 0.07 K/uL    Comment: Performed at [facility], [place] [address] [place], [place] [address]  Comprehensive metabolic panel     Status: Abnormal   Collection Time: 08/03/19  5:35 AM  Result Value Ref Range   Sodium 138 135 - 145 mmol/L   Potassium 4.5 3.5 - 5.1 mmol/L   Chloride 110 98 - 111 mmol/L   CO2 20 (L) 22 - 32 mmol/L   Glucose, Bld 144 (H) 70 - 99 mg/dL   BUN 13 8 - 23 mg/dL   Creatinine, Ser 1.07 0.61 - 1.24 mg/dL   Calcium 8.0 (L) 8.9 - 10.3 mg/dL   Total Protein 6.1 (L) 6.5 - 8.1 g/dL   Albumin 2.7 (L) 3.5 - 5.0 g/dL   AST 25 15 - 41 U/L   ALT 15 0 - 44 U/L   Alkaline Phosphatase 53 38 - 126 U/L   Total Bilirubin 0.7 0.3 - 1.2 mg/dL   GFR calc non Af Amer >60 >60 mL/min   GFR calc Af Amer >60 >60 mL/min   Anion gap 8 5 - 15    Comment: Performed at [facility] [address] [place], [place] [address]  C-reactive protein     Status: Abnormal   Collection Time: 08/03/19  5:35 AM  Result Value Ref Range   CRP 2.2 (H) <1.0 mg/dL    Comment: Performed at [place] [address] [place], [place] [address]  D-dimer, quantitative (not at [facility])     Status: None   Collection Time: 08/03/19  5:35 AM  Result Value Ref Range   D-Dimer, Quant 0.49 0.00 - 0.50 ug/mL-FEU     Comment: (NOTE) At the manufacturer cut-off of 0.50 ug/mL FEU, this assay has been documented to exclude PE with a sensitivity and negative predictive value of 97 to 99%.  At this time, this assay has not been approved by the FDA to exclude DVT/VTE. Results should be correlated with clinical presentation. Performed at [facility], [place] [address], [place], [place] [address]   Ferritin     Status: None   Collection Time: 08/03/19  5:35 AM  Result Value Ref Range   Ferritin 173 24 - 336 ng/mL    Comment: Performed at [facility], [place] [address] [place], [place] [address]  Magnesium     Status: None   Collection Time: 08/03/19  5:35 AM  Result Value Ref Range   Magnesium 1.8 1.7 - 2.4 mg/dL    Comment: Performed at [facility] [address] [place], [place] [address]  Phosphorus     Status: None   Collection Time: 08/03/19  5:35 AM  Result Value Ref Range   Phosphorus 2.9 2.5 - 4.6 mg/dL    Comment: Performed at [facility] [address] [place], [place] [address]   Dg Chest Portable 1 View  Result Date: 08/02/2019 CLINICAL DATA:  Dyspnea, cough, COVID-19 EXAM: PORTABLE CHEST 1 VIEW COMPARISON:  06/25/2019 FINDINGS: Cardiomegaly. Unchanged right basilar scarring or atelectasis. No acute appearing airspace opacity. The visualized skeletal structures are unremarkable. IMPRESSION: 1.  Cardiomegaly. 2. Unchanged right basilar scarring or atelectasis. No acute appearing airspace opacity. Electronically Signed   By: Eddie Candle M.D.   On: 08/02/2019 21:00    Pending Labs Unresulted Labs (From admission, onward)    Start     Ordered   08/10/19 0500  Creatinine, serum  (enoxaparin (LOVENOX)    CrCl >/= 30 ml/min)  Weekly,   R    Comments: while on enoxaparin therapy    08/03/19 0450   08/03/19 0500  CBC with Differential/Platelet  Daily,   R     08/03/19 0450   08/03/19 0500  Comprehensive metabolic panel  Daily,   R     08/03/19 0450   08/03/19 0500   C-reactive protein  Daily,   R     08/03/19 0450   08/03/19 0500  D-dimer, quantitative (not at [facility])  Daily,   R     08/03/19 0450   08/03/19 0500  Ferritin  Daily,   R     08/03/19 0450   08/03/19 0500  Magnesium  Daily,   R     08/03/19 0450   08/03/19 0500  Phosphorus  Daily,   R     08/03/19 0450   08/03/19 0239  SARS CORONAVIRUS 2 (TAT 6-24 HRS) Nasopharyngeal Nasopharyngeal Swab  (Symptomatic/High Risk of Exposure/Tier 1 Patients Labs with Precautions)  Once,   STAT    Question Answer Comment  Is this test for diagnosis or screening Diagnosis of ill patient   Symptomatic for COVID-19 as defined by CDC Yes   Date of Symptom Onset 08/02/2019   Hospitalized for COVID-19 Yes   Admitted to ICU for COVID-19 No   Previously tested for COVID-19 Yes   Resident in a congregate (group) care setting No   Employed in healthcare setting No      08/03/19 0239   08/02/19 2052  Blood Culture (routine x 2)  BLOOD CULTURE X 2,   STAT     08/02/19 2053          Vitals/Pain Today's Vitals   08/03/19 0938 08/03/19 1000 08/03/19 1100 08/03/19 1116  BP:  122/72 (!) 114/57   Pulse:   74   Resp:  (!) 25 (!) 27   Temp:   99.2 F (37.3 C)   TempSrc:   Oral   SpO2:   94%   Weight:   (!) 153.3 kg   Height:   6\' 1"  (1.854 m)   PainSc: Asleep  0-No pain 0-No pain    Isolation Precautions Airborne and Contact precautions  Medications Medications  sodium chloride flush (NS) 0.9 % injection 3 mL (3 mLs Intravenous Not Given 08/03/19 0035)  HYDROcodone-acetaminophen (NORCO/VICODIN) 5-325 MG per tablet 1 tablet (has no administration in time range)  atorvastatin (LIPITOR) tablet 10 mg (has no administration in time range)  furosemide (LASIX) tablet 20-40 mg (has no administration in time range)  metoprolol succinate (TOPROL-XL) 24 hr tablet 25 mg (25 mg Oral Given 08/03/19 1016)  pantoprazole (PROTONIX) EC tablet 40 mg (40 mg Oral Given 08/03/19 1016)  rivaroxaban (XARELTO) tablet 20 mg (has no  administration in time range)  sodium chloride flush (NS) 0.9 % injection 3 mL (3 mLs Intravenous Given 08/03/19 1016)  sodium chloride flush (NS) 0.9 % injection 3 mL (has no administration in time range)  0.9 %  sodium chloride infusion (has no administration in time range)  dexamethasone (DECADRON) injection 6 mg (6 mg Intravenous Given 08/03/19 1016)  acetaminophen (TYLENOL) tablet 650 mg (has no administration in time range)  ondansetron (ZOFRAN) tablet 4 mg (has no administration in time range)    Or  ondansetron (ZOFRAN) injection 4 mg (has no administration in time range)    Mobility  Moderate fall risk   Focused Assessments Pulmonary  Assessment Handoff:  Lung sounds:   O2 Device: Room Air        R Recommendations: See Admitting Provider Note  Report given to:   Additional Notes: Patient has depends as well as a bag draining fluid from his kidneys. Pt is post-op kidney surgery.

## 2019-08-03 NOTE — H&P (Signed)
History and Physical    Abdulwahab Demelo ZOX:[id number] DOB: May 29, 1941 DOA: 08/02/2019  PCP: Lujean Amel, MD   Patient coming from: [place]   Chief Complaint: SOB   HPI: Patrick Bishop is a 78 y.o. 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  Patient was noted to have increased work of breathing today at his independent living facility, reports that he has felt short of breath with exertion for more than a month now, reports a mild cough that has developed more recently, and denies subjective fevers or chills.  He was admitted in late September with septic shock secondary to urinary source, had obstructing left UPJ stone that was addressed with percutaneous nephrostomy tube, and he was discharged to an SNF where he was reportedly exposed to COVID-19.  He reports testing positive for COVID-19 on 07/30/2019 but does not have these results with him and they are not visible in his electronic medical record.  He believes that his daughter has documentation of the positive result but she is not answering her phone.  Patient denies any chest pain.  Reports that his chronic leg swelling seems to be unchanged.  Denies any flank pain or dysuria.  ED Course: Upon arrival to the ED, patient is found to be afebrile, saturating 88% on room air, mildly tachypneic, and with stable blood pressure.  EKG features a sinus rhythm and chest x-ray is negative for acute findings.  Chemistry panel features and albumin of 2.9 and protein of 6.4.  There is a lymphocytopenia and neutropenia on CBC.  Lactic acid is normal.  D-dimer is normal.  CRP elevated to 2.2.  Blood cultures were collected in the emergency department and the patient was started on 2 L/min of supplemental oxygen.  ED  physician is attempting to have the COVID-19 results faxed to [place].  Review of Systems:  All other systems reviewed and apart from HPI, are negative.  Past Medical History:  Diagnosis Date   Chronic diastolic heart failure ([place])    Normal LV Fxn by Echo 04/2017    Chronic venous insufficiency 2015   s/p Bilateral Iliac & Femoral Stents (including IVC) - DEEP Venous Reflux.  Also s/p Bilateral GSV Ablation.   Essential hypertension    Hx of adenomatous colonic polyps    Hyperlipidemia with target LDL less than 100    Longstanding persistent atrial fibrillation ([place])    Nephrolithiasis 05/2019   Obesity    OSA on CPAP    May need New CPAP machine & CPAP MD)   Pulmonary hypertension ([place])    Pyelonephritis 05/2019    Past Surgical History:  Procedure Laterality Date   ABDOMINAL VENOUS DUPLEX Bilateral 07/2014   ([place]: Dr. Cleda Mccreedy): (prior to Bilateral Iliac-Femoral Venous Stent Placement): Patent IVC.  No DVT B/L Iliac or Com fem V..  +++ DEEP V REFLUX B/L ILIAC & FEM V.  Absent Superficial V Reflux B/L x L SSV (too tortuous for intervention).  BL GSV occluded - prior ablation.    ABDOMINAL VENOUS DUPLEX Bilateral 11/2014   Absent DVT.  Left common femoral venous reflux: 1508 MS left common femoral vein, 2284MS left proximal femoral vein -> cystic structure noted in the left popliteal vein (7.6 x 1.4 x 3.8 cm).;  Right leg deep venous reflux noted in the distal iliac,  common femoral, proximal profunda femoral, proximal superficial femoral, superficial femoral and popliteal vein.   CARDIAC CATHETERIZATION  10/2009   [facility] - Dr. 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  EF~45%.  Mild pulmonary pretension.   CARDIAC PET  08/2009   Inferolateral partially reversible defect -- > false positive by cath   COLONOSCOPY     ESOPHAGOGASTRODUODENOSCOPY     GREATER SAPHENOUS VEIN ABLATION Bilateral  Before 2015   [facility] (Dr. Eugenie Filler)   IR NEPHROSTOMY PLACEMENT LEFT  06/24/2019   IR TRANSCATH PLC STENT  EA ADD VEIN  INC ANGIOPLASTY Left 10/2014   ([place], Dr. 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 Stormy Fabian): US Guided --> R Common & External Iliac, Common Femoral Venography with Placement of 24 mmx 70 mm Stent - R Com Iliac-Ext Iliac- & Com Fem V. reducing 60-80% venous compression to ~0%).;; also noted significant L-sided venous compression (staged stenting).    NM MYOVIEW LTD  11/2005   Adenosine Myoview [facility] - Dr. Cleda Mccreedy): Normal myocardial perfusion scan. No ischemia or infarction.   TRANSTHORACIC ECHOCARDIOGRAM  04/26/2017    [facility] - Dr. Cleda Mccreedy) - report not available.  Per clinic note: Normal global LV function. Mild concentric temperature. Mild TR. Mild pulmonary hypertension.;;    TRANSTHORACIC ECHOCARDIOGRAM  7/'15; 7/'17   [facility] - Dr. Saverio Danker) a) low normal LV function (EF 56%). Moderate concentric LVH. Mild LA dilation. Normal pulmonary pressures.  ;; b) normal global function - EF 60-65%. Moderate pulmonary hypertension. Biatrial enlargement. Moderate concentric LVH.   TRANSTHORACIC ECHOCARDIOGRAM  02/2010   [facility] - Dr. Saverio Danker): Normal global LV function. Mild LVH. EF 60%.; November 2010. Moderate LV dilation. Moderate LVH. Normal EF 50-55%. Mild to moderate pulmonary hypertension. (July 2012 showed normal function with mild LVH, mild MR noted in August 2014)     reports that he quit smoking about 24 years ago. His smoking use included cigarettes. He has a 35.00 pack-year smoking history. He has  never used smokeless tobacco. He reports that he does not drink alcohol or use drugs.  Allergies  Allergen Reactions   Aldactone [Spironolactone]     - unknown     Family History  Problem Relation Age of Onset   Cancer Mother    Heart attack Father 10   Hypertension Brother    Cancer Sister    Other Neg Hx        He really does not know much about his parents and siblings health, but they did not speak about healthcare     Prior to Admission medications   Medication Sig Start Date End Date Taking? Authorizing Provider  atorvastatin (LIPITOR) 10 MG tablet TAKE 1 TABLET DAILY AT 6 P.M. Patient taking differently: Take 10 mg by mouth daily.  11/30/18   [name], MD  cephALEXin (KEFLEX) 500 MG capsule Take 1 capsule (500 mg total) by mouth every 12 (twelve) hours. 07/01/19   Geradine Girt, DO  furosemide (LASIX) 40 MG tablet Take  20 mg  ( 1/2 tablet) twice a day except  on Monday and Thursday take 40 mg twice a day. Patient taking differently: Take 20-40 mg by mouth See admin instructions. Take  20 mg  ( 1/2 tablet) twice a day except on Monday and Thursday take 40 mg twice a day. 07/02/18   [name], MD  HYDROcodone-acetaminophen (NORCO/VICODIN) 5-325 MG tablet Take 1 tablet by mouth 2 (two) times daily as needed for moderate pain. 07/02/19   Geradine Girt, DO  magnesium oxide (MAG-OX) 400 (241.3 Mg) MG tablet Take 1 tablet (400 mg total) by mouth daily. 07/01/19   Geradine Girt, DO  metoprolol succinate (TOPROL-XL) 25 MG 24 hr tablet Take 1 tablet (25 mg total) by mouth daily. 07/02/19   Geradine Girt, DO  pantoprazole (PROTONIX) 40 MG tablet Take 1 tablet (40 mg total) by mouth daily. 07/02/19   Eulogio Bear U, DO  XARELTO 20 MG TABS tablet TAKE 1 TABLET DAILY WITH SUPPER Patient taking differently: Take 20 mg by mouth daily with supper.  11/09/18   [name], MD    Physical Exam: Vitals:   08/02/19 1855 08/02/19 1857 08/02/19 2100 08/02/19 2104  BP:  122/66  92/64 108/68  Pulse: 70  70 75  Resp: 18  16 (!) 23  Temp: 99.2 F (37.3 C)     TempSrc: Oral     SpO2: 95%  94% (!) 88%  Weight:  (!) 153.3 kg    Height:  6\' 1"  (1.854 m)      Constitutional: NAD, calm  Eyes: PERTLA, lids and conjunctivae normal ENMT: Mucous membranes are moist. Posterior pharynx clear of any exudate or lesions.   Neck: normal, supple, no masses, no thyromegaly Respiratory:  no wheezing, no crackles. Mild tachypnea. No accessory muscle use.  Cardiovascular: S1 & S2 heard, regular rate and rhythm. Bilateral leg swelling. Abdomen: No distension, no tenderness, soft. Bowel sounds active.  Musculoskeletal: no clubbing / cyanosis. No joint deformity upper and lower extremities.  Skin: no significant rashes, lesions, ulcers. Warm, dry, well-perfused. Neurologic: No facial asymmetry. Sensation intact. Moving all extremities.  Psychiatric: Alert and oriented to person, place, and situation. Calm, cooperative.     Labs on Admission: I have personally reviewed following labs and imaging studies  CBC: Recent Labs  Lab 08/02/19 1916 08/02/19 2155  WBC 1.9* 2.0*  NEUTROABS  --  1.2*  HGB 11.4* 11.1*  HCT 35.9* 34.6*  MCV 93.5 93.5  PLT 146* 798*   Basic Metabolic Panel: Recent Labs  Lab 08/02/19 1916  NA 137  K 4.2  CL 108  CO2 21*  GLUCOSE 111*  BUN 12  CREATININE 1.22  CALCIUM 8.1*   GFR: Estimated Creatinine Clearance: 75.9 mL/min (by C-G formula based on SCr of 1.22 mg/dL). Liver Function Tests: Recent Labs  Lab 08/02/19 2155  AST 28  ALT 17  ALKPHOS 48  BILITOT 0.9  PROT 6.4*  ALBUMIN 2.9*   No results for input(s): LIPASE, AMYLASE in the last 168 hours. No results for input(s): AMMONIA in the last 168 hours. Coagulation Profile: No results for input(s): INR, PROTIME in the last 168 hours. Cardiac Enzymes: No results for input(s): CKTOTAL, CKMB, CKMBINDEX, TROPONINI in the last 168 hours. BNP (last 3 results) No results for  input(s): PROBNP in the last 8760 hours. HbA1C: No results for input(s): HGBA1C in the last 72 hours. CBG: No results for input(s): GLUCAP in the last 168 hours. Lipid Profile: Recent Labs    08/02/19 2158  TRIG 57  Thyroid Function Tests: No results for input(s): TSH, T4TOTAL, FREET4, T3FREE, THYROIDAB in the last 72 hours. Anemia Panel: Recent Labs    08/02/19 2155  FERRITIN 196   Urine analysis:    Component Value Date/Time   COLORURINE YELLOW 06/24/2019 1430   APPEARANCEUR HAZY (A) 06/24/2019 1430   LABSPEC 1.016 06/24/2019 1430   PHURINE 5.0 06/24/2019 1430   GLUCOSEU NEGATIVE 06/24/2019 1430   HGBUR LARGE (A) 06/24/2019 1430   BILIRUBINUR NEGATIVE 06/24/2019 1430   KETONESUR NEGATIVE 06/24/2019 1430   PROTEINUR 30 (A) 06/24/2019 1430   NITRITE POSITIVE (A) 06/24/2019 1430   LEUKOCYTESUR SMALL (A) 06/24/2019 1430   Sepsis Labs: @LABRCNTIP (procalcitonin:4,lacticidven:4) )No results found for this or any previous visit (from the past 240 hour(s)).   Radiological Exams on Admission: Dg Chest Portable 1 View  Result Date: 08/02/2019 CLINICAL DATA:  Dyspnea, cough, COVID-19 EXAM: PORTABLE CHEST 1 VIEW COMPARISON:  06/25/2019 FINDINGS: Cardiomegaly. Unchanged right basilar scarring or atelectasis. No acute appearing airspace opacity. The visualized skeletal structures are unremarkable. IMPRESSION: 1.  Cardiomegaly. 2. Unchanged right basilar scarring or atelectasis. No acute appearing airspace opacity. Electronically Signed   By: Eddie Candle M.D.   On: 08/02/2019 21:00    EKG: Independently reviewed. Sinus rhythm.   Assessment/Plan   1. Suspected COVID-19 infection; acute hypoxic respiratory failure  - Presents from his ILF where he was noted to have increased WOB, reports testing positive for COVID-19 on 07/30/19 but results of this not currently available  - He was initially saturating mid-90's on rm air but now 88% at rest and being started on supplemental O2  -  No acute findings on CXR; d-dimer is normal - ED physician is attempting to get COVID test results faxed over; if we are unable to confirm he may need to be retested  - Blood cultures were collected in ED  - Continue supplemental O2 as needed, continue isolation, prone as tolerated, trend inflammatory markers, start Decadron, if unable to get positive COVID result faxed will need to retest     2. Leukopenia  - WBC is 2000 on admission with ANC 1160 and ALC 480  - Likely secondary to viral infection, will repeat CBC in am    3. Chronic diastolic CHF  - Appears compensated  - Continue Lasix and beta-blocker   4. Atrial fibrillation  - In sinus rhythm on admission  - CHADS-VASc is 76 (age x2, CHF) - Continue Xarelto and metoprolol    5. Hydronephrosis s/p nephrostomy tube  - Patient was admitted with septic shock secondary to urinary source in late September, was found to have 1 cm left UPJ stone with hydronephrosis, and had percutaneous nephrostomy tube placed by IR on 9/28  - Nephrostomy is draining and renal function preserved on admission  - He is followed by urology and planned for upcoming cystoscopy with laser lithotripsy and stent placement     PPE: CAPR, gown, gloves  DVT prophylaxis: Xarelto  Code Status: Full  Family Communication: Discussed with patient  Consults called: None  Admission status: Observation     Vianne Bulls, MD [facility] Pager [phone]  If 7PM-7AM, please contact night-coverage [email or web address] Password [email or web address]  08/03/2019, 12:10 AM

## 2019-08-03 NOTE — Discharge Instructions (Addendum)
COVID-19 COVID-19 is a respiratory infection that is caused by a virus called severe acute respiratory syndrome coronavirus 2 (SARS-CoV-2). The disease is also known as coronavirus disease or novel coronavirus. In some people, the virus may not cause any symptoms. In others, it may cause a serious infection. The infection can get worse quickly and can lead to complications, such as:  Pneumonia, or infection of the lungs.  Acute respiratory distress syndrome or ARDS. This is fluid build-up in the lungs.  Acute respiratory failure. This is a condition in which there is not enough oxygen passing from the lungs to the body.  Sepsis or septic shock. This is a serious bodily reaction to an infection.  Blood clotting problems.  Secondary infections due to bacteria or fungus. The virus that causes COVID-19 is contagious. This means that it can spread from person to person through droplets from coughs and sneezes (respiratory secretions). What are the causes? This illness is caused by a virus. You may catch the virus by:  Breathing in droplets from an infected person's cough or sneeze.  Touching something, like a table or a doorknob, that was exposed to the virus (contaminated) and then touching your mouth, nose, or eyes. What increases the risk? Risk for infection You are more likely to be infected with this virus if you:  Live in or travel to an area with a COVID-19 outbreak.  Come in contact with a sick person who recently traveled to an area with a COVID-19 outbreak.  Provide care for or live with a person who is infected with COVID-19. Risk for serious illness You are more likely to become seriously ill from the virus if you:  Are 66 years of age or older.  Have a long-term disease that lowers your body's ability to fight infection (immunocompromised).  Live in a nursing home or long-term care facility.  Have a long-term (chronic) disease such as: ? Chronic lung disease,  including chronic obstructive pulmonary disease or asthma ? Heart disease. ? Diabetes. ? Chronic kidney disease. ? Liver disease.  Are obese. What are the signs or symptoms? Symptoms of this condition can range from mild to severe. Symptoms may appear any time from 2 to 14 days after being exposed to the virus. They include:  A fever.  A cough.  Difficulty breathing.  Chills.  Muscle pains.  A sore throat.  Loss of taste or smell. Some people may also have stomach problems, such as nausea, vomiting, or diarrhea. Other people may not have any symptoms of COVID-19. How is this diagnosed? This condition may be diagnosed based on:  Your signs and symptoms, especially if: ? You live in an area with a COVID-19 outbreak. ? You recently traveled to or from an area where the virus is common. ? You provide care for or live with a person who was diagnosed with COVID-19.  A physical exam.  Lab tests, which may include: ? A nasal swab to take a sample of fluid from your nose. ? A throat swab to take a sample of fluid from your throat. ? A sample of mucus from your lungs (sputum). ? Blood tests.  Imaging tests, which may include, X-rays, CT scan, or ultrasound. How is this treated? At present, there is no medicine to treat COVID-19. Medicines that treat other diseases are being used on a trial basis to see if they are effective against COVID-19. Your health care provider will talk with you about ways to treat your symptoms. For  most people, the infection is mild and can be managed at home with rest, fluids, and over-the-counter medicines. Treatment for a serious infection usually takes places in a hospital intensive care unit (ICU). It may include one or more of the following treatments. These treatments are given until your symptoms improve.  Receiving fluids and medicines through an IV.  Supplemental oxygen. Extra oxygen is given through a tube in the nose, a face mask, or a  hood.  Positioning you to lie on your stomach (prone position). This makes it easier for oxygen to get into the lungs.  Continuous positive airway pressure (CPAP) or bi-level positive airway pressure (BPAP) machine. This treatment uses mild air pressure to keep the airways open. A tube that is connected to a motor delivers oxygen to the body.  Ventilator. This treatment moves air into and out of the lungs by using a tube that is placed in your windpipe.  Tracheostomy. This is a procedure to create a hole in the neck so that a breathing tube can be inserted.  Extracorporeal membrane oxygenation (ECMO). This procedure gives the lungs a chance to recover by taking over the functions of the heart and lungs. It supplies oxygen to the body and removes carbon dioxide. Follow these instructions at home: Lifestyle  If you are sick, stay home except to get medical care. Your health care provider will tell you how long to stay home. Call your health care provider before you go for medical care.  Rest at home as told by your health care provider.  Do not use any products that contain nicotine or tobacco, such as cigarettes, e-cigarettes, and chewing tobacco. If you need help quitting, ask your health care provider.  Return to your normal activities as told by your health care provider. Ask your health care provider what activities are safe for you. General instructions  Take over-the-counter and prescription medicines only as told by your health care provider.  Drink enough fluid to keep your urine pale yellow.  Keep all follow-up visits as told by your health care provider. This is important. How is this prevented?  There is no vaccine to help prevent COVID-19 infection. However, there are steps you can take to protect yourself and others from this virus. To protect yourself:   Do not travel to areas where COVID-19 is a risk. The areas where COVID-19 is reported change often. To identify  high-risk areas and travel restrictions, check the CDC travel website: FatFares.com.br  If you live in, or must travel to, an area where COVID-19 is a risk, take precautions to avoid infection. ? Stay away from people who are sick. ? Wash your hands often with soap and water for 20 seconds. If soap and water are not available, use an alcohol-based hand sanitizer. ? Avoid touching your mouth, face, eyes, or nose. ? Avoid going out in public, follow guidance from your state and local health authorities. ? If you must go out in public, wear a cloth face covering or face mask. ? Disinfect objects and surfaces that are frequently touched every day. This may include:  Counters and tables.  Doorknobs and light switches.  Sinks and faucets.  Electronics, such as phones, remote controls, keyboards, computers, and tablets. To protect others: If you have symptoms of COVID-19, take steps to prevent the virus from spreading to others.  If you think you have a COVID-19 infection, contact your health care provider right away. Tell your health care team that you think you  may have a COVID-19 infection.  Stay home. Leave your house only to seek medical care. Do not use public transport.  Do not travel while you are sick.  Wash your hands often with soap and water for 20 seconds. If soap and water are not available, use alcohol-based hand sanitizer.  Stay away from other members of your household. Let healthy household members care for children and pets, if possible. If you have to care for children or pets, wash your hands often and wear a mask. If possible, stay in your own room, separate from others. Use a different bathroom.  Make sure that all people in your household wash their hands well and often.  Cough or sneeze into a tissue or your sleeve or elbow. Do not cough or sneeze into your hand or into the air.  Wear a cloth face covering or face mask. Where to find more  information  Centers for Disease Control and Prevention: PurpleGadgets.be  World Health Organization: https://www.castaneda.info/ Contact a health care provider if:  You live in or have traveled to an area where COVID-19 is a risk and you have symptoms of the infection.  You have had contact with someone who has COVID-19 and you have symptoms of the infection. Get help right away if:  You have trouble breathing.  You have pain or pressure in your chest.  You have confusion.  You have bluish lips and fingernails.  You have difficulty waking from sleep.  You have symptoms that get worse. These symptoms may represent a serious problem that is an emergency. Do not wait to see if the symptoms will go away. Get medical help right away. Call your local emergency services (911 in the [place] Do not drive yourself to the hospital. Let the emergency medical personnel know if you think you have COVID-19. Summary  COVID-19 is a respiratory infection that is caused by a virus. It is also known as coronavirus disease or novel coronavirus. It can cause serious infections, such as pneumonia, acute respiratory distress syndrome, acute respiratory failure, or sepsis.  The virus that causes COVID-19 is contagious. This means that it can spread from person to person through droplets from coughs and sneezes.  You are more likely to develop a serious illness if you are 21 years of age or older, have a weak immunity, live in a nursing home, or have chronic disease.  There is no medicine to treat COVID-19. Your health care provider will talk with you about ways to treat your symptoms.  Take steps to protect yourself and others from infection. Wash your hands often and disinfect objects and surfaces that are frequently touched every day. Stay away from people who are sick and wear a mask if you are sick. This information is not intended to replace advice given to you by  your health care provider. Make sure you discuss any questions you have with your health care provider. Document Released: 10/18/2018 Document Revised: 02/07/2019 Document Reviewed: 10/18/2018 Elsevier Patient Education  2020 [place] on my medicine - XARELTO (Rivaroxaban)  Why was Xarelto prescribed for you? Xarelto was prescribed for you to reduce the risk of a blood clot forming that can cause a stroke if you have a medical condition called atrial fibrillation (a type of irregular heartbeat).  What do you need to know about xarelto ? Take your Xarelto ONCE DAILY at the same time every day with your evening meal. If you have difficulty swallowing the tablet whole, you may  crush it and mix in applesauce just prior to taking your dose.  Take Xarelto exactly as prescribed by your doctor and DO NOT stop taking Xarelto without talking to the doctor who prescribed the medication.  Stopping without other stroke prevention medication to take the place of Xarelto may increase your risk of developing a clot that causes a stroke.  Refill your prescription before you run out.  After discharge, you should have regular check-up appointments with your healthcare provider that is prescribing your Xarelto.  In the future your dose may need to be changed if your kidney function or weight changes by a significant amount.  What do you do if you miss a dose? If you are taking Xarelto ONCE DAILY and you miss a dose, take it as soon as you remember on the same day then continue your regularly scheduled once daily regimen the next day. Do not take two doses of Xarelto at the same time or on the same day.   Important Safety Information A possible side effect of Xarelto is bleeding. You should call your healthcare provider right away if you experience any of the following: ? Bleeding from an injury or your nose that does not stop. ? Unusual colored urine (red or dark brown) or unusual  colored stools (red or black). ? Unusual bruising for unknown reasons. ? A serious fall or if you hit your head (even if there is no bleeding).  Some medicines may interact with Xarelto and might increase your risk of bleeding while on Xarelto. To help avoid this, consult your healthcare provider or pharmacist prior to using any new prescription or non-prescription medications, including herbals, vitamins, non-steroidal anti-inflammatory drugs (NSAIDs) and supplements.  This website has more information on Xarelto: https://guerra-benson.com/.

## 2019-08-03 NOTE — Plan of Care (Signed)
  Problem: Education: Goal: Knowledge of General Education information will improve Description Including pain rating scale, medication(s)/side effects and non-pharmacologic comfort measures Outcome: Progressing   

## 2019-08-03 NOTE — ED Notes (Signed)
Lunch Try Ordered @ 1028.

## 2019-08-03 NOTE — Progress Notes (Signed)
AOx4 no complaints offered, patient with L nephrostomy tube draining large amount brownish output, site clean and without erythema or exudate, patient incontinent of urine x2, cleaned and made comfortable. Continues to have some tachypnea rate of 30, but denies SOB or feeling labored. Oxygen 2 L via nasal cannula SAT maintained at 97%. Will continue to observe and monitor for change.

## 2019-08-04 LAB — COMPREHENSIVE METABOLIC PANEL
ALT: 14 U/L (ref 0–44)
AST: 21 U/L (ref 15–41)
Albumin: 2.6 g/dL — ABNORMAL LOW (ref 3.5–5.0)
Alkaline Phosphatase: 47 U/L (ref 38–126)
Anion gap: 9 (ref 5–15)
BUN: 14 mg/dL (ref 8–23)
CO2: 24 mmol/L (ref 22–32)
Calcium: 8.3 mg/dL — ABNORMAL LOW (ref 8.9–10.3)
Chloride: 109 mmol/L (ref 98–111)
Creatinine, Ser: 1.07 mg/dL (ref 0.61–1.24)
GFR calc Af Amer: >60 mL/min (ref >60)
GFR calc non Af Amer: >60 mL/min (ref >60)
Glucose, Bld: 160 mg/dL — ABNORMAL HIGH (ref 70–99)
Potassium: 4.1 mmol/L (ref 3.5–5.1)
Sodium: 142 mmol/L (ref 135–145)
Total Bilirubin: 0.5 mg/dL (ref 0.3–1.2)
Total Protein: 6 g/dL — ABNORMAL LOW (ref 6.5–8.1)

## 2019-08-04 LAB — CBC WITH DIFFERENTIAL/PLATELET
Abs Immature Granulocytes: 0.01 10*3/uL (ref 0.00–0.07)
Basophils Absolute: 0 10*3/uL (ref 0.0–0.1)
Basophils Relative: 0 %
Eosinophils Absolute: 0 10*3/uL (ref 0.0–0.5)
Eosinophils Relative: 0 %
HCT: 35.3 % — ABNORMAL LOW (ref 39.0–52.0)
Hemoglobin: 11.8 g/dL — ABNORMAL LOW (ref 13.0–17.0)
Immature Granulocytes: 0 %
Lymphocytes Relative: 10 %
Lymphs Abs: 0.4 10*3/uL — ABNORMAL LOW (ref 0.7–4.0)
MCH: 29.9 pg (ref 26.0–34.0)
MCHC: 33.4 g/dL (ref 30.0–36.0)
MCV: 89.4 fL (ref 80.0–100.0)
Monocytes Absolute: 0.5 10*3/uL (ref 0.1–1.0)
Monocytes Relative: 12 %
Neutro Abs: 3.1 10*3/uL (ref 1.7–7.7)
Neutrophils Relative %: 78 %
Platelets: 157 10*3/uL (ref 150–400)
RBC: 3.95 MIL/uL — ABNORMAL LOW (ref 4.22–5.81)
RDW: 14 % (ref 11.5–15.5)
WBC: 3.9 10*3/uL — ABNORMAL LOW (ref 4.0–10.5)
nRBC: 0 % (ref 0.0–0.2)

## 2019-08-04 LAB — MRSA PCR SCREENING: MRSA by PCR: NEGATIVE

## 2019-08-04 LAB — D-DIMER, QUANTITATIVE: D-Dimer, Quant: 0.44 ug/mL-FEU (ref 0.00–0.50)

## 2019-08-04 LAB — PHOSPHORUS: Phosphorus: 2.4 mg/dL — ABNORMAL LOW (ref 2.5–4.6)

## 2019-08-04 LAB — FERRITIN: Ferritin: 204 ng/mL (ref 24–336)

## 2019-08-04 LAB — C-REACTIVE PROTEIN: CRP: 1.2 mg/dL — ABNORMAL HIGH (ref <1.0)

## 2019-08-04 LAB — MAGNESIUM: Magnesium: 1.8 mg/dL (ref 1.7–2.4)

## 2019-08-04 MED ORDER — ALBUTEROL SULFATE HFA 108 (90 BASE) MCG/ACT IN AERS
1.0000 | INHALATION_SPRAY | RESPIRATORY_TRACT | Status: DC | PRN
  Filled 2019-08-04: qty 6.7

## 2019-08-04 NOTE — Progress Notes (Signed)
Lab called at 1538 patient was Covid Positive and Dr Wyline Copas was made aware. Rico Sheehan RN

## 2019-08-04 NOTE — Progress Notes (Signed)
[facility] was notified regarding patient needing transport to [facility]

## 2019-08-04 NOTE — Progress Notes (Signed)
PROGRESS NOTE    Patrick Bishop  HYW:[id number] DOB: 07/31/1941 DOA: 08/02/2019 PCP: Lujean Amel, MD    Brief Narrative:  78 y.o. 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  Patient was noted to have increased work of breathing today at his independent living facility, reports that he has felt short of breath with exertion for more than a month now, reports a mild cough that has developed more recently, and denies subjective fevers or chills.  He was admitted in late September with septic shock secondary to urinary source, had obstructing left UPJ stone that was addressed with percutaneous nephrostomy tube, and he was discharged to an SNF where he was reportedly exposed to COVID-19.  He reports testing positive for COVID-19 on 07/30/2019 but does not have these results with him and they are not visible in his electronic medical record.  He believes that his daughter has documentation of the positive result but she is not answering her phone.  Patient denies any chest pain.  Reports that his chronic leg swelling seems to be unchanged.  Denies any flank pain or dysuria.  ED Course: Upon arrival to the ED, patient is found to be afebrile, saturating 88% on room air, mildly tachypneic, and with stable blood pressure.  EKG features a sinus rhythm and chest x-ray is negative for acute findings.  Chemistry panel features and albumin of 2.9 and protein of 6.4.  There is a lymphocytopenia and neutropenia on CBC.  Lactic acid is normal.  D-dimer is normal.  CRP elevated to 2.2.  Blood cultures were collected in the emergency department and the patient was started on 2 L/min of supplemental oxygen.  Assessment & Plan:   Principal Problem:   Shortness of breath Active Problems:  Chronic diastolic HF (heart failure) (HCC)   Essential hypertension   Longstanding persistent atrial fibrillation: CHA2DSVasc = ~3 (age x 2, HTN)   Hydronephrosis with obstructing calculus   Leukopenia   Suspected COVID-19 virus infection   Pneumonia  1. Suspected COVID-19 infection; acute hypoxic respiratory failure  - Presents from his ILF where he was noted to have increased WOB, reportedly outside test from facility returned positive -In-house covid test confirms positive result - In ED, pt noted to have O2 sats as low as 88%, started on steroids -No acute findings on CXR; d-dimer is normal -Patient reports feeling better and remains on minimal O2 support. However, pt noted to have tachypnea overnight and into today with RR as high as the 30's -CRP is down to 1.2 this AM  2. Leukopenia  - WBC is 2000 on admission with ANC 1160 and ALC 480  - Likely secondary to viral infection -WBC up to 3.9 -Cont to follow CBC  3. Chronic diastolic CHF  - Appears compensated at this time - Continue home PO Lasix and beta-blocker   4. Atrial fibrillation  - In sinus rhythm on admission  - CHADS-VASc is 32 (age x2, CHF) - For now, will continue Xarelto and metoprolol as tolerated  5. Hydronephrosis s/p nephrostomy tube  - Patient was admitted with septic shock secondary to urinary source in late September, was found to have 1 cm left UPJ stone with hydronephrosis, and had percutaneous nephrostomy tube placed by IR on 9/28  - Nephrostomy appears patent and renal function preserved on admission  -  He is followed by urology and planned for upcoming cystoscopy with laser lithotripsy and stent placement    6. Chronic venous insufficiency -Pt with hemosiderin related skin changes in BLE and has past hx of vascular surgery for symptomatic varicose veins -some dependent swelling this AM that, per pt, resolves with elevation and worse while sitting. -Encourage elevating LE when at rest  DVT  prophylaxis: Xarelto Code Status: Full Family Communication: Pt in room, family not at bedside Disposition Plan: Uncertain at this time  Consultants:     Procedures:     Antimicrobials: Anti-infectives (From admission, onward)   None      Subjective: States feeling better today. Denies feeling sob despite RR in the 30's overnight  Objective: Vitals:   08/04/19 0000 08/04/19 0826 08/04/19 0830 08/04/19 0842  BP:  123/72  117/77  Pulse:  88  85  Resp:  16    Temp:  98.2 F (36.8 C)    TempSrc:      SpO2: 97% 94% 96%   Weight:      Height:        Intake/Output Summary (Last 24 hours) at 08/04/2019 1554 Last data filed at 08/04/2019 0830 Gross per 24 hour  Intake 240 ml  Output 1200 ml  Net -960 ml   Filed Weights   08/02/19 1857 08/03/19 1100 08/03/19 2300  Weight: (!) 153.3 kg (!) 153.3 kg (!) 151.2 kg    Examination: General exam: Awake, sitting in chair, in nad Respiratory system: Normal respiratory effort, no audible wheezing Cardiovascular system: regular rate, perfused Gastrointestinal system: Soft, nondistended, positive BS Central nervous system: CN2-12 grossly intact, strength intact Extremities: Perfused, no clubbing Skin: Normal skin turgor, chronic skin changes in BLE related to venous insufficiency Psychiatry: Mood normal // no visual hallucinations   Data Reviewed: I have personally reviewed following labs and imaging studies  CBC: Recent Labs  Lab 08/02/19 1916 08/02/19 2155 08/03/19 0535 08/04/19 0418  WBC 1.9* 2.0* 1.4* 3.9*  NEUTROABS  --  1.2* 1.1* 3.1  HGB 11.4* 11.1* 11.5* 11.8*  HCT 35.9* 34.6* 35.7* 35.3*  MCV 93.5 93.5 92.0 89.4  PLT 146* 143* 125* 751   Basic Metabolic Panel: Recent Labs  Lab 08/02/19 1916 08/03/19 0535 08/04/19 0418  NA 137 138 142  K 4.2 4.5 4.1  CL 108 110 109  CO2 21* 20* 24  GLUCOSE 111* 144* 160*  BUN 12 13 14   CREATININE 1.22 1.07 1.07  CALCIUM 8.1* 8.0* 8.3*  MG  --  1.8 1.8  PHOS  --   2.9 2.4*   GFR: Estimated Creatinine Clearance: 85.8 mL/min (by C-G formula based on SCr of 1.07 mg/dL). Liver Function Tests: Recent Labs  Lab 08/02/19 2155 08/03/19 0535 08/04/19 0418  AST 28 25 21   ALT 17 15 14   ALKPHOS 48 53 47  BILITOT 0.9 0.7 0.5  PROT 6.4* 6.1* 6.0*  ALBUMIN 2.9* 2.7* 2.6*   No results for input(s): LIPASE, AMYLASE in the last 168 hours. No results for input(s): AMMONIA in the last 168 hours. Coagulation Profile: No results for input(s): INR, PROTIME in the last 168 hours. Cardiac Enzymes: No results for input(s): CKTOTAL, CKMB, CKMBINDEX, TROPONINI in the last 168 hours. BNP (last 3 results) No results for input(s): PROBNP in the last 8760 hours. HbA1C: No results for input(s): HGBA1C in the last 72 hours. CBG: No results for input(s): GLUCAP in the last 168 hours. Lipid Profile: Recent Labs    08/02/19 2158  TRIG  57   Thyroid Function Tests: No results for input(s): TSH, T4TOTAL, FREET4, T3FREE, THYROIDAB in the last 72 hours. Anemia Panel: Recent Labs    08/03/19 0535 08/04/19 0418  FERRITIN 173 204   Sepsis Labs: Recent Labs  Lab 08/02/19 2155 08/02/19 2158 08/03/19 0535  PROCALCITON <0.10  --  <0.10  LATICACIDVEN  --  1.3  --     Recent Results (from the past 240 hour(s))  Blood Culture (routine x 2)     Status: None (Preliminary result)   Collection Time: 08/02/19  9:58 PM   Specimen: BLOOD RIGHT FOREARM  Result Value Ref Range Status   Specimen Description BLOOD RIGHT FOREARM  Final   Special Requests   Final    BOTTLES DRAWN AEROBIC AND ANAEROBIC Blood Culture adequate volume   Culture   Final    NO GROWTH 2 DAYS Performed at [facility], [place] [address], [place], [place] [address]    Report Status PENDING  Incomplete  Blood Culture (routine x 2)     Status: None (Preliminary result)   Collection Time: 08/02/19 11:27 PM   Specimen: BLOOD  Result Value Ref Range Status   Specimen Description BLOOD LEFT  ANTECUBITAL  Final   Special Requests   Final    BOTTLES DRAWN AEROBIC AND ANAEROBIC Blood Culture adequate volume   Culture   Final    NO GROWTH 1 DAY Performed at [facility], [place] [address], [place], [place] [address]    Report Status PENDING  Incomplete  SARS CORONAVIRUS 2 (TAT 6-24 HRS) Nasopharyngeal Nasopharyngeal Swab     Status: Abnormal   Collection Time: 08/03/19  4:09 AM   Specimen: Nasopharyngeal Swab  Result Value Ref Range Status   SARS Coronavirus 2 POSITIVE (A) NEGATIVE Final    Comment: RESULT CALLED TO, READ BACK BY AND VERIFIED WITH: NADINE WELLINGTON RN.@1538  ON 11.7.2020 BY TCALDWELL MT. (NOTE) SARS-CoV-2 target nucleic acids are DETECTED. The SARS-CoV-2 RNA is generally detectable in upper and lower respiratory specimens during the acute phase of infection. Positive results are indicative of active infection with SARS-CoV-2. Clinical  correlation with patient history and other diagnostic information is necessary to determine patient infection status. Positive results do  not rule out bacterial infection or co-infection with other viruses. The expected result is Negative. Fact Sheet for Patients: SugarRoll.be Fact Sheet for Healthcare Providers: https://www.woods-mathews.com/ This test is not yet approved or cleared by the [place] FDA and  has been authorized for detection and/or diagnosis of SARS-CoV-2 by FDA under an Emergency Use Authorization (EUA). This EUA will remain  in effect (meaning this te st can be used) for the duration of the COVID-19 declaration under Section 564(b)(1) of the Act, 21 U.S.C. section 360bbb-3(b)(1), unless the authorization is terminated or revoked sooner. Performed at [facility], [place] [address], [place], [place] [address]   MRSA PCR Screening     Status: None   Collection Time: 08/03/19  7:51 PM   Specimen: Nasopharyngeal  Result Value Ref Range Status   MRSA  by PCR NEGATIVE NEGATIVE Final    Comment:        The GeneXpert MRSA Assay (FDA approved for NASAL specimens only), is one component of a comprehensive MRSA colonization surveillance program. It is not intended to diagnose MRSA infection nor to guide or monitor treatment for MRSA infections. Performed at [facility], [place] [address] [place], [place] [address]      Radiology  Studies: Dg Chest Portable 1 View  Result Date: 08/02/2019 CLINICAL DATA:  Dyspnea, cough, COVID-19 EXAM: PORTABLE CHEST 1 VIEW COMPARISON:  06/25/2019 FINDINGS: Cardiomegaly. Unchanged right basilar scarring or atelectasis. No acute appearing airspace opacity. The visualized skeletal structures are unremarkable. IMPRESSION: 1.  Cardiomegaly. 2. Unchanged right basilar scarring or atelectasis. No acute appearing airspace opacity. Electronically Signed   By: Eddie Candle M.D.   On: 08/02/2019 21:00    Scheduled Meds: . atorvastatin  10 mg Oral q1800  . dexamethasone (DECADRON) injection  6 mg Intravenous Daily  . furosemide  20 mg Oral 2 times per day on Sun Tue Wed Fri Sat   And  . [START ON 08/05/2019] furosemide  40 mg Oral 2 times per day on Mon Thu  . metoprolol succinate  25 mg Oral Daily  . pantoprazole  40 mg Oral Daily  . rivaroxaban  20 mg Oral Q supper  . sodium chloride flush  3 mL Intravenous Once  . sodium chloride flush  3 mL Intravenous Q12H   Continuous Infusions: . sodium chloride       LOS: 1 day   Marylu Lund, MD [facility] Pager On Amion  If 7PM-7AM, please contact night-coverage 08/04/2019, 3:54 PM

## 2019-08-05 DIAGNOSIS — J1282 Pneumonia due to coronavirus disease 2019: Secondary | ICD-10-CM

## 2019-08-05 DIAGNOSIS — J1289 Other viral pneumonia: Secondary | ICD-10-CM

## 2019-08-05 DIAGNOSIS — U071 COVID-19: Secondary | ICD-10-CM

## 2019-08-05 LAB — SAMPLE TO BLOOD BANK

## 2019-08-05 MED ORDER — HYDROCOD POLST-CPM POLST ER 10-8 MG/5ML PO SUER
5.0000 mL | Freq: Two times a day (BID) | ORAL | Status: DC
  Administered 2019-08-05 – 2019-08-09 (×9): 5 mL via ORAL
  Filled 2019-08-05 (×9): qty 5

## 2019-08-05 MED ORDER — SODIUM CHLORIDE 0.9 % IV SOLN
200.0000 mg | Freq: Once | INTRAVENOUS | Status: AC
  Administered 2019-08-05: 200 mg via INTRAVENOUS
  Filled 2019-08-05: qty 40

## 2019-08-05 MED ORDER — ADULT MULTIVITAMIN W/MINERALS CH
1.0000 | ORAL_TABLET | Freq: Every day | ORAL | Status: DC
  Administered 2019-08-05 – 2019-08-09 (×5): 1 via ORAL
  Filled 2019-08-05 (×4): qty 1

## 2019-08-05 MED ORDER — MAGNESIUM SULFATE 2 GM/50ML IV SOLN
2.0000 g | Freq: Once | INTRAVENOUS | Status: AC
  Administered 2019-08-05: 2 g via INTRAVENOUS
  Filled 2019-08-05: qty 50

## 2019-08-05 MED ORDER — SODIUM CHLORIDE 0.9 % IV SOLN
100.0000 mg | INTRAVENOUS | Status: AC
  Administered 2019-08-06 – 2019-08-09 (×4): 100 mg via INTRAVENOUS
  Filled 2019-08-05 (×3): qty 20
  Filled 2019-08-05: qty 100

## 2019-08-05 MED ORDER — ENSURE ENLIVE PO LIQD
237.0000 mL | Freq: Three times a day (TID) | ORAL | Status: DC
  Administered 2019-08-05 – 2019-08-09 (×11): 237 mL via ORAL

## 2019-08-05 MED ORDER — FUROSEMIDE 20 MG PO TABS
40.0000 mg | ORAL_TABLET | Freq: Every day | ORAL | Status: DC
  Administered 2019-08-05 – 2019-08-08 (×4): 40 mg via ORAL
  Filled 2019-08-05 (×4): qty 2

## 2019-08-05 NOTE — Evaluation (Addendum)
Occupational Therapy Evaluation Patient Details Name: Patrick Bishop MRN: [id number] DOB: 1941-02-02 Today's Date: 08/05/2019    History of Present Illness 78 y.o.malewith medical history significant foratrial fibrillation, chronic diastolic CHF, OSA on CPAP, now presenting to the ED for evaluation of shortness of breath. He was admitted in late September with septic shock secondary to urinary source, had obstructing left UPJ stone that was addressed with percutaneous nephrostomy tube, and he was discharged to an SNF where he was reportedly exposed to COVID-19. Pt now admitted for Suspected COVID-19 infection; acute hypoxic respiratory failure.    Clinical Impression   This 78 y/o male presents with the above. PTA pt reports mod independent with ADL and functional mobility using SPC/rollator; pt reports increased effort/difficulties with LB bathing tasks, he had just returned back to ILF from [facility] rehab stay prior to this admission, lives alone. Pt overall requiring minA using RW for functional mobility this session (room level). He currently requires mod-maxA for LB ADL. Pt on RA throughout session with O2 sats maintaining >90% with activity (measured via earlobe), max HR noted 121. He will benefit from continued acute OT services and recommend follow up [facility] services after discharge to maximize his safety and independence with ADL and mobility. Will follow.     Follow Up Recommendations  Home health OT;Supervision/Assistance - 24 hour(rec close to 24hr initially)    Equipment Recommendations  3 in 1 bedside commode           Precautions / Restrictions Precautions Precautions: Fall Restrictions Weight Bearing Restrictions: No      Mobility Bed Mobility Overal bed mobility: Needs Assistance Bed Mobility: Supine to Sit     Supine to sit: Min guard     General bed mobility comments: for lines and safety  Transfers Overall transfer level: Needs assistance Equipment used:  Rolling walker (2 wheeled) Transfers: Sit to/from Stand Sit to Stand: +2 safety/equipment;Min assist         General transfer comment: pt required increased boosting assist from lower EOB, VCs for safe hand placement as pt with preference for pulling up on RW    Balance Overall balance assessment: Needs assistance Sitting-balance support: Feet supported Sitting balance-Leahy Scale: Good     Standing balance support: Bilateral upper extremity supported Standing balance-Leahy Scale: Poor Standing balance comment: reliant on UE support/external assist                           ADL either performed or assessed with clinical judgement   ADL Overall ADL's : Needs assistance/impaired Eating/Feeding: Modified independent;Sitting   Grooming: Set up;Min guard;Sitting   Upper Body Bathing: Sitting;Min guard   Lower Body Bathing: Moderate assistance;Sit to/from stand Lower Body Bathing Details (indicate cue type and reason): pt able to reach lower portion of LEs, reports difficulty reaching buttocks Upper Body Dressing : Set up;Min guard;Sitting Upper Body Dressing Details (indicate cue type and reason): doffing/donning new gown Lower Body Dressing: Moderate assistance;Maximal assistance;+2 for safety/equipment;Sit to/from stand   Toilet Transfer: Minimal assistance;+2 for safety/equipment;Ambulation Toilet Transfer Details (indicate cue type and reason): simulated via transfer to/from recliner Toileting- Clothing Manipulation and Hygiene: Moderate assistance;Sit to/from stand Toileting - Clothing Manipulation Details (indicate cue type and reason): pt with leaking condom cath start of session, required assist for pericare/bathing ADL and changing of gown. suspect pt may have increased difficulty with pericare after BM, may benefit from education on use of AE     Functional mobility  during ADLs: Minimal assistance;+2 for safety/equipment;Rolling walker       Vision  Baseline Vision/History: Cataracts(R eye) Patient Visual Report: No change from baseline Additional Comments: will continue to assess, pt appeared to have some difficulty with navigating through room using RW                Pertinent Vitals/Pain Pain Assessment: No/denies pain     Hand Dominance Right   Extremity/Trunk Assessment Upper Extremity Assessment Upper Extremity Assessment: Generalized weakness   Lower Extremity Assessment Lower Extremity Assessment: Defer to PT evaluation   Cervical / Trunk Assessment Cervical / Trunk Assessment: Other exceptions Cervical / Trunk Exceptions: increased body habitus   Communication Communication Communication: HOH   Cognition Arousal/Alertness: Awake/alert Behavior During Therapy: WFL for tasks assessed/performed Overall Cognitive Status: Within Functional Limits for tasks assessed                                 General Comments: for basic functional tasks   General Comments       Exercises     Shoulder Instructions      Home Living Family/patient expects to be discharged to:: Private residence(ILF) Living Arrangements: Alone Available Help at Discharge: Family;Available PRN/intermittently Type of Home: Apartment Home Access: Level entry     Home Layout: One level     Bathroom Shower/Tub: Occupational psychologist: Handicapped height     Home Equipment: Shower seat - built in;Grab bars - tub/shower;Grab bars - toilet;Walker - 4 wheels;Cane - quad;Cane - single point          Prior Functioning/Environment Level of Independence: Independent with assistive device(s)        Comments: reports use of SPC/rollator for mobility, reports able to complete ADL without assist but increased efforts for bathing, especially LB, had been sponge bathing due to recent nephrostomy        OT Problem List: Decreased strength;Decreased activity tolerance;Decreased range of motion;Impaired balance  (sitting and/or standing);Decreased knowledge of use of DME or AE;Cardiopulmonary status limiting activity;Obesity      OT Treatment/Interventions: Self-care/ADL training;Therapeutic exercise;Energy conservation;DME and/or AE instruction;Therapeutic activities;Patient/family education;Balance training    OT Goals(Current goals can be found in the care plan section) Acute Rehab OT Goals Patient Stated Goal: return home, maintain his independence OT Goal Formulation: With patient Time For Goal Achievement: 08/19/19 Potential to Achieve Goals: Good  OT Frequency: Min 3X/week   Barriers to D/C:            Co-evaluation PT/OT/SLP Co-Evaluation/Treatment: Yes Reason for Co-Treatment: For patient/therapist safety;To address functional/ADL transfers   OT goals addressed during session: ADL's and self-care      AM-PAC OT "6 Clicks" Daily Activity     Outcome Measure Help from another person eating meals?: None Help from another person taking care of personal grooming?: A Little Help from another person toileting, which includes using toliet, bedpan, or urinal?: A Lot Help from another person bathing (including washing, rinsing, drying)?: A Lot Help from another person to put on and taking off regular upper body clothing?: A Little Help from another person to put on and taking off regular lower body clothing?: A Lot 6 Click Score: 16   End of Session Equipment Utilized During Treatment: Rolling walker Nurse Communication: Mobility status  Activity Tolerance: Patient tolerated treatment well Patient left: in chair;with call bell/phone within reach;with chair alarm set  OT Visit Diagnosis: Unsteadiness on feet (  R26.81);Muscle weakness (generalized) (M62.81)                Time: 0950-1020 OT Time Calculation (min): 30 min Charges:  OT General Charges $OT Visit: 1 Visit OT Evaluation $OT Eval Moderate Complexity: 1 Mod  Lou Cal, OT [employer] Pager [phone] Office [phone]  Raymondo Band 08/05/2019, 11:07 AM

## 2019-08-05 NOTE — Plan of Care (Signed)
  Problem: Education: Goal: Knowledge of General Education information will improve Description: Including pain rating scale, medication(s)/side effects and non-pharmacologic comfort measures Outcome: Progressing   Problem: Education: Goal: Knowledge of General Education information will improve Description: Including pain rating scale, medication(s)/side effects and non-pharmacologic comfort measures Outcome: Progressing   Problem: Health Behavior/Discharge Planning: Goal: Ability to manage health-related needs will improve Outcome: Progressing   Problem: Clinical Measurements: Goal: Ability to maintain clinical measurements within normal limits will improve Outcome: Progressing Goal: Will remain free from infection Outcome: Progressing Goal: Diagnostic test results will improve Outcome: Progressing Goal: Respiratory complications will improve Outcome: Progressing Goal: Cardiovascular complication will be avoided Outcome: Progressing   Problem: Activity: Goal: Risk for activity intolerance will decrease Outcome: Progressing   Problem: Nutrition: Goal: Adequate nutrition will be maintained Outcome: Progressing   Problem: Coping: Goal: Level of anxiety will decrease Outcome: Progressing   Problem: Elimination: Goal: Will not experience complications related to bowel motility Outcome: Progressing Goal: Will not experience complications related to urinary retention Outcome: Progressing   Problem: Pain Managment: Goal: General experience of comfort will improve Outcome: Progressing   Problem: Safety: Goal: Ability to remain free from injury will improve Outcome: Progressing   Problem: Skin Integrity: Goal: Risk for impaired skin integrity will decrease Outcome: Progressing   Problem: Education: Goal: Knowledge of risk factors and measures for prevention of condition will improve Outcome: Progressing   Problem: Coping: Goal: Psychosocial and spiritual needs  will be supported Outcome: Progressing   Problem: Respiratory: Goal: Will maintain a patent airway Outcome: Progressing Goal: Complications related to the disease process, condition or treatment will be avoided or minimized Outcome: Progressing

## 2019-08-05 NOTE — Progress Notes (Addendum)
PROGRESS NOTE    Larron Armor  TIW:[id number] DOB: 1940/10/27 DOA: 08/02/2019 PCP: Lujean Amel, MD    Brief Narrative:  78 year old male who presented with dyspnea.  He does have significant past medical history for atrial fibrillation, diastolic heart failure, obstructive sleep apnea and left percutaneous nephrostomy tube due to left UPJ stone with hydronephrosis.  Patient lives at independent living facility where he was noted to have increased work of breathing, apparently he has been developing progressive dyspnea on exertion associated with cough.  After recent hospitalization in September 2020 related to septic shock due to urinary source he was discharged to a skilled nursing facility where he was exposed to COVID-19 and apparently tested + November 3.  On his initial physical examination his oximetry was 88% on room air, blood pressure 122/66, heart rate 75, respirate 23, he had tachypnea, his lungs had no wheezing or rails, heart S1-S2 present rhythmic, abdomen soft, positive lower extremity edema. Sodium 137, potassium 4.2, chloride 108, bicarb 21, glucose 111, BUN 12, creatinine 1.2, white count 2.0, hemoglobin 9.1, hematocrit 34.6, platelets 143.  SARS COVID-19 was pending.  His a chest x-ray had a right basal interstitial infiltrate.  EKG 71 bpm, normal axis, normal QRS prolongation and normal QTC, atrial fibrillation rhythm, no ST segment or T wave changes, low voltage.  Patient was admitted to the hospital with a working diagnosis of acute hypoxic respiratory failure suspected SARS COVID-19 viral pneumonia.  Patient initially was admitted to [facility] as patient under investigation for COVID-19, he received supplemental oxygen per nasal cannula and systemic corticosteroids.  He had persistent tachypnea, with respiratory rate in the 30s.  His COVID-19 PCR resulted positive, and he was transferred to [place] to continue his care.   Assessment & Plan:    Principal Problem:   Pneumonia due to COVID-19 virus Active Problems:   Chronic diastolic HF (heart failure) (HCC)   Essential hypertension   Longstanding persistent atrial fibrillation: CHA2DSVasc = ~3 (age x 2, HTN)   Hydronephrosis with obstructing calculus   Shortness of breath   Leukopenia   Suspected COVID-19 virus infection   Pneumonia   1. Acute hypoxic respiratory failure due to SARS COVID 19 viral pneumonia.   RR: 22  Pulse oxymetry: 94 to 95%  Fi02:21% room air.  COVID-19 Labs  Recent Labs    08/02/19 2155 08/03/19 0535 08/04/19 0418  DDIMER 0.41 0.49 0.44  FERRITIN 196 173 204  LDH 220*  --   --   CRP 2.2* 2.2* 1.2*    Lab Results  Component Value Date   SARSCOV2NAA POSITIVE (A) 08/03/2019   [place] NEGATIVE 07/01/2019   [place] NEGATIVE 06/24/2019   Inflammatory markers are trending down.   Will continue medical therapy with Remdesivir #2/5, systemic corticosteroids with dexamethasone. Antitussive with chlorpheniramine, guaifenesin DM, and airway clearing techniques with flutter valve and incentive spirometer.   Leukopenia likely related to viral process.   2. Chronic diastolic HF. Stable with no signs of exacerbation, will continue oral furosemide (simplified regimen with 40 mg daily) and blood pressure control.   3. Chronic atrial fibrillation. Will continue rate control with metoprolol and anticoagulation with rivaroxaban. Ok to discontinue telemetry.   4. Sp Left nephrostomy tube. Tube is in place, functioning well, renal function is preserved.  5. Chronic venous insufficiency. Continue elevation as tolerated. DVT prophylaxis.   6. Morbid obesity with dyslipidemia. Calculated BMI [place] 43,9. Continue with atorvastatin.   7. Hypomagnesemia. Serum Mg at 1,8, target  of 2, correction with Mag sulfate.    DVT prophylaxis: rivaroxaban   Code Status:  full Family Communication: no family at the bedside  Disposition Plan/ discharge barriers:  pending clinical improvement.   Body mass index is 43.98 kg/m. Malnutrition Type:      Malnutrition Characteristics:      Nutrition Interventions:     RN Pressure Injury Documentation:     Consultants:     Procedures:     Antimicrobials:       Subjective: Patient is feeling better, dyspnea is improving but not yet back to baseline, continue to have cough, no nausea or vomiting.   Objective: Vitals:   08/04/19 1610 08/04/19 1733 08/04/19 2136 08/05/19 0400  BP: 118/64  124/75 (!) 141/68  Pulse: 89  98   Resp:    (!) 22  Temp: 98.9 F (37.2 C)  97.8 F (36.6 C) 98.6 F (37 C)  TempSrc: Oral  Oral Oral  SpO2: 95% 98% 94% 95%  Weight:      Height:        Intake/Output Summary (Last 24 hours) at 08/05/2019 0735 Last data filed at 08/04/2019 2128 Gross per 24 hour  Intake 240 ml  Output 125 ml  Net 115 ml   Filed Weights   08/02/19 1857 08/03/19 1100 08/03/19 2300  Weight: (!) 153.3 kg (!) 153.3 kg (!) 151.2 kg    Examination:   General: Not in pain or dyspnea, deconditioned  Neurology: Awake and alert, non focal  E ENT: no pallor, no icterus, oral mucosa moist Cardiovascular: No JVD. S1-S2 present, rhythmic, no gallops, rubs, or murmurs. Positive non pitting lower extremity edema. Pulmonary: positive breath sounds bilaterally. Gastrointestinal. Abdomen with no organomegaly, non tender, no rebound or guarding Skin. No rashes Musculoskeletal: no joint deformities     Data Reviewed: I have personally reviewed following labs and imaging studies  CBC: Recent Labs  Lab 08/02/19 1916 08/02/19 2155 08/03/19 0535 08/04/19 0418  WBC 1.9* 2.0* 1.4* 3.9*  NEUTROABS  --  1.2* 1.1* 3.1  HGB 11.4* 11.1* 11.5* 11.8*  HCT 35.9* 34.6* 35.7* 35.3*  MCV 93.5 93.5 92.0 89.4  PLT 146* 143* 125* 952   Basic Metabolic Panel: Recent Labs  Lab 08/02/19 1916 08/03/19 0535 08/04/19 0418  NA 137 138 142  K 4.2 4.5 4.1  CL 108 110 109  CO2 21* 20*  24  GLUCOSE 111* 144* 160*  BUN 12 13 14   CREATININE 1.22 1.07 1.07  CALCIUM 8.1* 8.0* 8.3*  MG  --  1.8 1.8  PHOS  --  2.9 2.4*   GFR: Estimated Creatinine Clearance: 85.8 mL/min (by C-G formula based on SCr of 1.07 mg/dL). Liver Function Tests: Recent Labs  Lab 08/02/19 2155 08/03/19 0535 08/04/19 0418  AST 28 25 21   ALT 17 15 14   ALKPHOS 48 53 47  BILITOT 0.9 0.7 0.5  PROT 6.4* 6.1* 6.0*  ALBUMIN 2.9* 2.7* 2.6*   No results for input(s): LIPASE, AMYLASE in the last 168 hours. No results for input(s): AMMONIA in the last 168 hours. Coagulation Profile: No results for input(s): INR, PROTIME in the last 168 hours. Cardiac Enzymes: No results for input(s): CKTOTAL, CKMB, CKMBINDEX, TROPONINI in the last 168 hours. BNP (last 3 results) No results for input(s): PROBNP in the last 8760 hours. HbA1C: No results for input(s): HGBA1C in the last 72 hours. CBG: No results for input(s): GLUCAP in the last 168 hours. Lipid Profile: Recent Labs    08/02/19  2158  TRIG 57   Thyroid Function Tests: No results for input(s): TSH, T4TOTAL, FREET4, T3FREE, THYROIDAB in the last 72 hours. Anemia Panel: Recent Labs    08/03/19 0535 08/04/19 0418  FERRITIN 173 204      Radiology Studies: I have reviewed all of the imaging during this hospital visit personally     Scheduled Meds: . atorvastatin  10 mg Oral q1800  . dexamethasone (DECADRON) injection  6 mg Intravenous Daily  . furosemide  20 mg Oral 2 times per day on Sun Tue Wed Fri Sat   And  . furosemide  40 mg Oral 2 times per day on Mon Thu  . metoprolol succinate  25 mg Oral Daily  . pantoprazole  40 mg Oral Daily  . rivaroxaban  20 mg Oral Q supper  . sodium chloride flush  3 mL Intravenous Once  . sodium chloride flush  3 mL Intravenous Q12H   Continuous Infusions: . sodium chloride       LOS: 2 days        Faiza Bansal Gerome Apley, MD

## 2019-08-05 NOTE — Progress Notes (Signed)
Called and spoke with the patient's daughter/point of contact [place]. I updated her on the patient's condition, lab values, medications, vitals, and the plan of care. I answered all of her questions at this time, encouraged her to reach out with any other concerns.

## 2019-08-05 NOTE — Progress Notes (Signed)
MEDICATION RELATED CONSULT NOTE - INITIAL   Pharmacy Consult for remdesivir Indication: COVID-19   Allergies  Allergen Reactions  . Aldactone [Spironolactone]     - unknown     Patient Measurements: Height: 6\' 1"  (185.4 cm) Weight: (!) 333 lb 5.4 oz (151.2 kg) IBW/kg (Calculated) : 79.9  Vital Signs: Temp: 98.6 F (37 C) (11/09 0400) Temp Source: Oral (11/09 0400) BP: 141/68 (11/09 0400) Pulse Rate: 98 (11/08 2136) Intake/Output from previous day: 11/08 0701 - 11/09 0700 In: 240 [P.O.:240] Out: 125 [Urine:125] Intake/Output from this shift: No intake/output data recorded.  Labs: Recent Labs    08/02/19 1916 08/02/19 2155 08/03/19 0535 08/04/19 0418  WBC 1.9* 2.0* 1.4* 3.9*  HGB 11.4* 11.1* 11.5* 11.8*  HCT 35.9* 34.6* 35.7* 35.3*  PLT 146* 143* 125* 157  CREATININE 1.22  --  1.07 1.07  MG  --   --  1.8 1.8  PHOS  --   --  2.9 2.4*  ALBUMIN  --  2.9* 2.7* 2.6*  PROT  --  6.4* 6.1* 6.0*  AST  --  28 25 21   ALT  --  17 15 14   ALKPHOS  --  48 53 47  BILITOT  --  0.9 0.7 0.5  BILIDIR  --  0.2  --   --   IBILI  --  0.7  --   --    Estimated Creatinine Clearance: 85.8 mL/min (by C-G formula based on SCr of 1.07 mg/dL).   Microbiology: Recent Results (from the past 720 hour(s))  Blood Culture (routine x 2)     Status: None (Preliminary result)   Collection Time: 08/02/19  9:58 PM   Specimen: BLOOD RIGHT FOREARM  Result Value Ref Range Status   Specimen Description BLOOD RIGHT FOREARM  Final   Special Requests   Final    BOTTLES DRAWN AEROBIC AND ANAEROBIC Blood Culture adequate volume   Culture   Final    NO GROWTH 3 DAYS Performed at [facility], [address] [place], [place] [address]    Report Status PENDING  Incomplete  Blood Culture (routine x 2)     Status: None (Preliminary result)   Collection Time: 08/02/19 11:27 PM   Specimen: BLOOD  Result Value Ref Range Status   Specimen Description BLOOD LEFT ANTECUBITAL  Final   Special  Requests   Final    BOTTLES DRAWN AEROBIC AND ANAEROBIC Blood Culture adequate volume   Culture   Final    NO GROWTH 2 DAYS Performed at [facility], [place] [address] [place],  [address]    Report Status PENDING  Incomplete  SARS CORONAVIRUS 2 (TAT 6-24 HRS) Nasopharyngeal Nasopharyngeal Swab     Status: Abnormal   Collection Time: 08/03/19  4:09 AM   Specimen: Nasopharyngeal Swab  Result Value Ref Range Status   SARS Coronavirus 2 POSITIVE (A) NEGATIVE Final    Comment: RESULT CALLED TO, READ BACK BY AND VERIFIED WITH: NADINE WELLINGTON RN.@[email or web address]  ON 11.7.2020 BY TCALDWELL MT. (NOTE) SARS-CoV-2 target nucleic acids are DETECTED. The SARS-CoV-2 RNA is generally detectable in upper and lower respiratory specimens during the acute phase of infection. Positive results are indicative of active infection with SARS-CoV-2. Clinical  correlation with patient history and other diagnostic information is necessary to determine patient infection status. Positive results do  not rule out bacterial infection or co-infection with other viruses. The expected result is Negative. Fact Sheet for Patients: SugarRoll.be Fact Sheet for Healthcare Providers:  https://www.woods-mathews.com/ This test is not yet approved or cleared by the [place] and  has been authorized for detection and/or diagnosis of SARS-CoV-2 by FDA under an Emergency Use Authorization (EUA). This EUA will remain  in effect (meaning this te st can be used) for the duration of the COVID-19 declaration under Section 564(b)(1) of the Act, 21 U.S.C. section 360bbb-3(b)(1), unless the authorization is terminated or revoked sooner. Performed at [facility], [place] [address] [place], [place] [address]   MRSA PCR Screening     Status: None   Collection Time: 08/03/19  7:51 PM   Specimen: Nasopharyngeal  Result Value Ref Range Status   MRSA by PCR NEGATIVE NEGATIVE Final     Comment:        The GeneXpert MRSA Assay (FDA approved for NASAL specimens only), is one component of a comprehensive MRSA colonization surveillance program. It is not intended to diagnose MRSA infection nor to guide or monitor treatment for MRSA infections. Performed at [facility], [place] [address] [place], [place] [address]     Medical History: Past Medical History:  Diagnosis Date  . Chronic diastolic heart failure (HCC)    Normal LV Fxn by Echo 04/2017   . Chronic venous insufficiency 2015   s/p Bilateral Iliac & Femoral Stents (including IVC) - DEEP Venous Reflux.  Also s/p Bilateral GSV Ablation.  . Essential hypertension   . Hx of adenomatous colonic polyps   . Hyperlipidemia with target LDL less than 100   . Longstanding persistent atrial fibrillation ([place])   . Nephrolithiasis 05/2019  . Obesity   . OSA on CPAP    May need New CPAP machine & CPAP MD)  . Pulmonary hypertension ([place])   . Pyelonephritis 05/2019    Assessment: 53 YOM with COVID-19 pneumonia to start IV remdesivir. Hypoxic on room air. ALT wnl.    Plan:  Start remdesivir 200 mg IV once, then start 100 mg daily x 4 hours  Monitor ALT  Albertina Parr, PharmD., BCPS Clinical Pharmacist Clinical phone for 08/05/19 until 5pm: [phone]

## 2019-08-05 NOTE — Progress Notes (Signed)
Initial Nutrition Assessment  DOCUMENTATION CODES:   Morbid obesity  INTERVENTION:    Ensure Enlive po TID, each supplement provides 350 kcal and 20 grams of protein.  Multivitamin with minerals daily.  Pt receiving Hormel Shake daily with Breakfast which provides 520 kcals and 22 g of protein and Magic cup BID with lunch and dinner, each supplement provides 290 kcal and 9 grams of protein, automatically on meal trays to optimize nutritional intake.   NUTRITION DIAGNOSIS:   Increased nutrient needs related to acute illness(COVID-19) as evidenced by estimated needs.  GOAL:   Patient will meet greater than or equal to 90% of their needs  MONITOR:   PO intake, Supplement acceptance, Skin  REASON FOR ASSESSMENT:   Malnutrition Screening Tool    ASSESSMENT:   78 yo male admitted with progressive SOB, COVID positive on 11/3. PMH includes A fib, CHF, OSA, HTN, chronic venous insufficiency, HLD, perc nephrostomy tube placed September 2020.   On admission, patient reported recent poor appetite with associated weight loss. Per review of usual weights, patient weighed 105.1 kg one month ago. Up to 151.2 kg on admission. Suspect weight fluctuations are related to volume status (leg swelling with venous insufficiency and hx of CHF).   Labs reviewed. Phosphorus 2.4 (L)  Medications reviewed and include decadron, lasix.  Currently on a heart healthy diet, consuming 50% of meals. Patient would benefit from PO supplements to ensure adequate intake to meet increased nutrition needs for COVID-19.  NUTRITION - FOCUSED PHYSICAL EXAM:  deferred  Diet Order:   Diet Order            Diet Heart Room service appropriate? Yes; Fluid consistency: Thin  Diet effective now              EDUCATION NEEDS:   Not appropriate for education at this time  Skin:  Skin Assessment: Reviewed RN Assessment  Last BM:  11/5  Height:   Ht Readings from Last 1 Encounters:  08/03/19 6\' 1"  (1.854  m)    Weight:   Wt Readings from Last 1 Encounters:  08/03/19 (!) 151.2 kg    Ideal Body Weight:  83.6 kg  BMI:  Body mass index is 43.98 kg/m.  Estimated Nutritional Needs:   Kcal:  2200-2400  Protein:  140-160 gm  Fluid:  2 L    Molli Barrows, RD, LDN, [place] Pager [phone] After Hours Pager [phone]

## 2019-08-05 NOTE — Evaluation (Signed)
Physical Therapy Evaluation Patient Details Name: Patrick Bishop MRN: [id number] DOB: 08-Apr-1941 Today's Date: 08/05/2019   History of Present Illness  78 y.o.malewith medical history significant foratrial fibrillation, chronic diastolic CHF, OSA on CPAP, now presenting to the ED for evaluation of shortness of breath. He was admitted in late September with septic shock secondary to urinary source, had obstructing left UPJ stone that was addressed with percutaneous nephrostomy tube, and he was discharged to an SNF where he was reportedly exposed to COVID-19. Pt now admitted for Suspected COVID-19 infection; acute hypoxic respiratory failure.   Clinical Impression   Pt admitted with above diagnosis. Patient had recent stay at [facility] for therapy and returned to his [place] for a few days before becoming ill with COVID. He is ambulatory with RW vs cane "hurrycane." Anticipate he should be able to return to ILF upon discharge. Pt currently with functional limitations due to the deficits listed below (see PT Problem List). Pt will benefit from skilled PT to increase their independence and safety with mobility to allow discharge to the venue listed below.       Follow Up Recommendations Home health PT;Supervision - Intermittent    Equipment Recommendations  None recommended by PT    Recommendations for Other Services       Precautions / Restrictions Precautions Precautions: Fall Restrictions Weight Bearing Restrictions: No      Mobility  Bed Mobility Overal bed mobility: Needs Assistance Bed Mobility: Supine to Sit     Supine to sit: Min guard     General bed mobility comments: for lines and safety  Transfers Overall transfer level: Needs assistance Equipment used: Rolling walker (2 wheeled) Transfers: Sit to/from Stand Sit to Stand: +2 safety/equipment;Min assist         General transfer comment: pt required increased boosting assist from lower EOB, VCs  for safe hand placement as pt with preference for pushing down on RW  Ambulation/Gait Ambulation/Gait assistance: Min guard;+2 safety/equipment Gait Distance (Feet): 40 Feet Assistive device: Rolling walker (2 wheeled) Gait Pattern/deviations: Step-to pattern;Decreased stride length;Wide base of support;Trunk flexed     General Gait Details: reg RW in room, therefore pt had to push it further in front of him as he could not fit inside the RW for upright posture  Stairs            Wheelchair Mobility    Modified Rankin (Stroke Patients Only)       Balance Overall balance assessment: Needs assistance Sitting-balance support: Feet supported Sitting balance-Leahy Scale: Good     Standing balance support: Bilateral upper extremity supported Standing balance-Leahy Scale: Poor Standing balance comment: reliant on UE support/external assist                             Pertinent Vitals/Pain Pain Assessment: No/denies pain    Home Living Family/patient expects to be discharged to:: Private residence(ILF) Living Arrangements: Alone Available Help at Discharge: Family;Available PRN/intermittently Type of Home: Independent living facility Home Access: Level entry     Home Layout: One level Home Equipment: Shower seat - built in;Grab bars - tub/shower;Grab bars - toilet;Walker - 4 wheels;Cane - quad;Cane - single point      Prior Function Level of Independence: Independent with assistive device(s)         Comments: reports use of SPC/rollator for mobility, reports able to complete ADL without assist but increased efforts for bathing, especially LB, had been sponge  bathing due to recent nephrostomy     Hand Dominance   Dominant Hand: Right    Extremity/Trunk Assessment   Upper Extremity Assessment Upper Extremity Assessment: Defer to OT evaluation    Lower Extremity Assessment Lower Extremity Assessment: Generalized weakness(legs very edematous)     Cervical / Trunk Assessment Cervical / Trunk Assessment: Other exceptions Cervical / Trunk Exceptions: morbidly obese  Communication   Communication: HOH  Cognition Arousal/Alertness: Awake/alert Behavior During Therapy: WFL for tasks assessed/performed Overall Cognitive Status: Within Functional Limits for tasks assessed                                 General Comments: for basic functional tasks      General Comments      Exercises Other Exercises Other Exercises: educated on use of IS and pt able to pull 1250 ml    Assessment/Plan    PT Assessment Patient needs continued PT services  PT Problem List Decreased strength;Decreased activity tolerance;Decreased balance;Decreased mobility;Decreased knowledge of use of DME;Cardiopulmonary status limiting activity;Obesity       PT Treatment Interventions DME instruction;Gait training;Functional mobility training;Therapeutic activities;Therapeutic exercise;Balance training;Patient/family education    PT Goals (Current goals can be found in the Care Plan section)  Acute Rehab PT Goals Patient Stated Goal: return home, maintain his independence PT Goal Formulation: With patient Time For Goal Achievement: 08/19/19 Potential to Achieve Goals: Good    Frequency Min 3X/week   Barriers to discharge        Co-evaluation PT/OT/SLP Co-Evaluation/Treatment: Yes Reason for Co-Treatment: For patient/therapist safety;To address functional/ADL transfers PT goals addressed during session: Mobility/safety with mobility;Balance;Proper use of DME         AM-PAC PT "6 Clicks" Mobility  Outcome Measure Help needed turning from your back to your side while in a flat bed without using bedrails?: A Little Help needed moving from lying on your back to sitting on the side of a flat bed without using bedrails?: A Little Help needed moving to and from a bed to a chair (including a wheelchair)?: A Little Help needed standing up  from a chair using your arms (e.g., wheelchair or bedside chair)?: A Little Help needed to walk in hospital room?: A Little Help needed climbing 3-5 steps with a railing? : A Lot 6 Click Score: 17    End of Session   Activity Tolerance: Patient limited by fatigue Patient left: in chair;with call bell/phone within reach;with chair alarm set Nurse Communication: Mobility status PT Visit Diagnosis: Muscle weakness (generalized) (M62.81);Difficulty in walking, not elsewhere classified (R26.2)    Time: 4801-6553 PT Time Calculation (min) (ACUTE ONLY): 31 min   Charges:   PT Evaluation $PT Eval Low Complexity: 1 Low           Barry Brunner, PT    [place] P Johnrobert Foti 08/05/2019, 2:01 PM

## 2019-08-06 LAB — COMPREHENSIVE METABOLIC PANEL
ALT: 19 U/L (ref 0–44)
AST: 22 U/L (ref 15–41)
Albumin: 3.1 g/dL — ABNORMAL LOW (ref 3.5–5.0)
Alkaline Phosphatase: 54 U/L (ref 38–126)
Anion gap: 10 (ref 5–15)
BUN: 22 mg/dL (ref 8–23)
CO2: 26 mmol/L (ref 22–32)
Calcium: 8.1 mg/dL — ABNORMAL LOW (ref 8.9–10.3)
Chloride: 103 mmol/L (ref 98–111)
Creatinine, Ser: 1.14 mg/dL (ref 0.61–1.24)
GFR calc Af Amer: >60 mL/min (ref >60)
GFR calc non Af Amer: >60 mL/min (ref >60)
Glucose, Bld: 103 mg/dL — ABNORMAL HIGH (ref 70–99)
Potassium: 4.1 mmol/L (ref 3.5–5.1)
Sodium: 139 mmol/L (ref 135–145)
Total Bilirubin: 1.1 mg/dL (ref 0.3–1.2)
Total Protein: 6.5 g/dL (ref 6.5–8.1)

## 2019-08-06 LAB — D-DIMER, QUANTITATIVE: D-Dimer, Quant: 0.39 ug/mL-FEU (ref 0.00–0.50)

## 2019-08-06 LAB — FERRITIN: Ferritin: 205 ng/mL (ref 24–336)

## 2019-08-06 LAB — C-REACTIVE PROTEIN: CRP: 3.4 mg/dL — ABNORMAL HIGH (ref <1.0)

## 2019-08-06 NOTE — Progress Notes (Signed)
Called and spoke with the patient's daughter [name]. I updated her on the vitals, medications, and plan of care for the patient. I answered all of her questions and she denied any other needs at this time.

## 2019-08-06 NOTE — Progress Notes (Signed)
PROGRESS NOTE    Patrick Bishop  YOV:[id number] DOB: 1941-05-19 DOA: 08/02/2019 PCP: Lujean Amel, MD    Brief Narrative:  78 year old male who presented with dyspnea.  He does have significant past medical history for atrial fibrillation, diastolic heart failure, obstructive sleep apnea and left percutaneous nephrostomy tube due to left UPJ stone with hydronephrosis.  Patient lives at independent living facility where he was noted to have increased work of breathing, apparently he has been developing progressive dyspnea on exertion associated with cough.  After recent hospitalization in September 2020 related to septic shock due to urinary source he was discharged to a skilled nursing facility where he was exposed to COVID-19 and apparently tested + November 3.  On his initial physical examination his oximetry was 88% on room air, blood pressure 122/66, heart rate 75, respirate 23, he had tachypnea, his lungs had no wheezing or rails, heart S1-S2 present rhythmic, abdomen soft, positive lower extremity edema. Sodium 137, potassium 4.2, chloride 108, bicarb 21, glucose 111, BUN 12, creatinine 1.2, white count 2.0, hemoglobin 9.1, hematocrit 34.6, platelets 143.  SARS COVID-19 was pending.  His a chest x-ray had a right basal interstitial infiltrate.  EKG 71 bpm, normal axis, normal QRS prolongation and normal QTC, atrial fibrillation rhythm, no ST segment or T wave changes, low voltage.  Patient was admitted to the hospital with a working diagnosis of acute hypoxic respiratory failure suspected SARS COVID-19 viral pneumonia.  Patient initially was admitted to [facility] as patient under investigation for COVID-19, he received supplemental oxygen per nasal cannula and systemic corticosteroids.  He had persistent tachypnea, with respiratory rate in the 30s.  His COVID-19 PCR resulted positive, and he was transferred to [place] to continue his care.  Patient clinically has been  improving, will plan to complete 5 days of Remdesivir before discharge.   Assessment & Plan:   Principal Problem:   Pneumonia due to COVID-19 virus Active Problems:   Chronic diastolic HF (heart failure) (HCC)   Essential hypertension   Longstanding persistent atrial fibrillation: CHA2DSVasc = ~3 (age x 2, HTN)   Hydronephrosis with obstructing calculus   Shortness of breath   Leukopenia   Suspected COVID-19 virus infection   Pneumonia    1. Acute hypoxic respiratory failure due to SARS COVID 19 viral pneumonia.  RR: 18  Pulse oxymetry: 96 to 98%  Fi02: 21% room air.  COVID-19 Labs  Recent Labs    08/04/19 0418 08/06/19 0151  DDIMER 0.44 0.39  FERRITIN 204 205  CRP 1.2* 3.4*    Lab Results  Component Value Date   SARSCOV2NAA POSITIVE (A) 08/03/2019   SARSCOV2NAA NEGATIVE 07/01/2019   [place] NEGATIVE 06/24/2019    Stable inflammatory markers.    Tolerating well medical therapy with Remdesivir #2/5, and systemic corticosteroids with dexamethasone.Continue with antitussive with chlorpheniramine, guaifenesin DM, and airway clearing techniques with flutter valve and incentive spirometer. Bronchodilator therapy.   Leukopenia likely related to viral process, wbc trending up.   2. Chronic diastolic HF. No signs of exacerbation, on oral furosemide with good toleration, continue blood pressure control.   3. Chronic atrial fibrillation. On metoprolol for rate control and anticoagulation with rivaroxaban.  4. Sp Left nephrostomy tube. Tube is in place, stable renal function, will need outpatient follow up.  5. Chronic venous insufficiency. Chronic edema, continue mobility as tolerated.    6. Morbid obesity with dyslipidemia. Calculated BMI [place] 43,9. On atorvastatin.   7. Hypomagnesemia. Follow Mg in am.  DVT prophylaxis: rivaroxaban   Code Status:  full Family Communication: no family at the bedside  Disposition Plan/ discharge barriers: pending  clinical improvement    Body mass index is 43.98 kg/m. Malnutrition Type:  Nutrition Problem: Increased nutrient needs Etiology: acute illness(COVID-19)   Malnutrition Characteristics:  Signs/Symptoms: estimated needs   Nutrition Interventions:  Interventions: Refer to RD note for recommendations  RN Pressure Injury Documentation:     Consultants:     Procedures:     Antimicrobials:       Subjective: Patient continue to have dyspnea, not yet back to baseline but improved, no nausea or vomiting, no chest pain. Chronic lower extremity edema.   Objective: Vitals:   08/05/19 1925 08/05/19 2030 08/06/19 0425 08/06/19 0727  BP:  (!) 123/56 113/65 (!) 110/52  Pulse: 97 93 80 (!) 51  Resp: 20 15 18 18   Temp:  98.5 F (36.9 C) 98 F (36.7 C) 98.5 F (36.9 C)  TempSrc:  Oral  Oral  SpO2: 90% 92% 98% 96%  Weight:      Height:        Intake/Output Summary (Last 24 hours) at 08/06/2019 7673 Last data filed at 08/06/2019 0400 Gross per 24 hour  Intake 300 ml  Output 1650 ml  Net -1350 ml   Filed Weights   08/02/19 1857 08/03/19 1100 08/03/19 2300  Weight: (!) 153.3 kg (!) 153.3 kg (!) 151.2 kg    Examination:   General: Not in pain or dyspnea, deconditioned  Neurology: Awake and alert, non focal  E ENT: mild pallor, no icterus, oral mucosa moist Cardiovascular: No JVD. S1-S2 present, rhythmic, no gallops, rubs, or murmurs. No lower extremity edema. Pulmonary:  Positive breath sounds bilaterally. Gastrointestinal. Abdomen with no organomegaly, non tender, no rebound or guarding Skin. No rashes Musculoskeletal: no joint deformities     Data Reviewed: I have personally reviewed following labs and imaging studies  CBC: Recent Labs  Lab 08/02/19 1916 08/02/19 2155 08/03/19 0535 08/04/19 0418  WBC 1.9* 2.0* 1.4* 3.9*  NEUTROABS  --  1.2* 1.1* 3.1  HGB 11.4* 11.1* 11.5* 11.8*  HCT 35.9* 34.6* 35.7* 35.3*  MCV 93.5 93.5 92.0 89.4  PLT 146*  143* 125* 419   Basic Metabolic Panel: Recent Labs  Lab 08/02/19 1916 08/03/19 0535 08/04/19 0418 08/06/19 0151  NA 137 138 142 139  K 4.2 4.5 4.1 4.1  CL 108 110 109 103  CO2 21* 20* 24 26  GLUCOSE 111* 144* 160* 103*  BUN 12 13 14 22   CREATININE 1.22 1.07 1.07 1.14  CALCIUM 8.1* 8.0* 8.3* 8.1*  MG  --  1.8 1.8  --   PHOS  --  2.9 2.4*  --    GFR: Estimated Creatinine Clearance: 80.6 mL/min (by C-G formula based on SCr of 1.14 mg/dL). Liver Function Tests: Recent Labs  Lab 08/02/19 2155 08/03/19 0535 08/04/19 0418 08/06/19 0151  AST 28 25 21 22   ALT 17 15 14 19   ALKPHOS 48 53 47 54  BILITOT 0.9 0.7 0.5 1.1  PROT 6.4* 6.1* 6.0* 6.5  ALBUMIN 2.9* 2.7* 2.6* 3.1*   No results for input(s): LIPASE, AMYLASE in the last 168 hours. No results for input(s): AMMONIA in the last 168 hours. Coagulation Profile: No results for input(s): INR, PROTIME in the last 168 hours. Cardiac Enzymes: No results for input(s): CKTOTAL, CKMB, CKMBINDEX, TROPONINI in the last 168 hours. BNP (last 3 results) No results for input(s): PROBNP in the last 8760 hours.  HbA1C: No results for input(s): HGBA1C in the last 72 hours. CBG: No results for input(s): GLUCAP in the last 168 hours. Lipid Profile: No results for input(s): CHOL, HDL, LDLCALC, TRIG, CHOLHDL, LDLDIRECT in the last 72 hours. Thyroid Function Tests: No results for input(s): TSH, T4TOTAL, FREET4, T3FREE, THYROIDAB in the last 72 hours. Anemia Panel: Recent Labs    08/04/19 0418 08/06/19 0151  FERRITIN 204 205      Radiology Studies: I have reviewed all of the imaging during this hospital visit personally     Scheduled Meds: . atorvastatin  10 mg Oral q1800  . chlorpheniramine-HYDROcodone  5 mL Oral Q12H  . dexamethasone (DECADRON) injection  6 mg Intravenous Daily  . feeding supplement (ENSURE ENLIVE)  237 mL Oral TID BM  . furosemide  40 mg Oral Daily  . metoprolol succinate  25 mg Oral Daily  . multivitamin  with minerals  1 tablet Oral Daily  . pantoprazole  40 mg Oral Daily  . rivaroxaban  20 mg Oral Q supper  . sodium chloride flush  3 mL Intravenous Once  . sodium chloride flush  3 mL Intravenous Q12H   Continuous Infusions: . sodium chloride    . remdesivir 100 mg in NS 250 mL       LOS: 3 days        Patrick Bishop Gerome Apley, MD

## 2019-08-06 NOTE — Plan of Care (Signed)
  Problem: Education: Goal: Knowledge of General Education information will improve Description: Including pain rating scale, medication(s)/side effects and non-pharmacologic comfort measures Outcome: Progressing   Problem: Education: Goal: Knowledge of General Education information will improve Description: Including pain rating scale, medication(s)/side effects and non-pharmacologic comfort measures Outcome: Progressing   Problem: Health Behavior/Discharge Planning: Goal: Ability to manage health-related needs will improve Outcome: Progressing   Problem: Clinical Measurements: Goal: Ability to maintain clinical measurements within normal limits will improve Outcome: Progressing Goal: Will remain free from infection Outcome: Progressing Goal: Diagnostic test results will improve Outcome: Progressing Goal: Respiratory complications will improve Outcome: Progressing Goal: Cardiovascular complication will be avoided Outcome: Progressing   Problem: Activity: Goal: Risk for activity intolerance will decrease Outcome: Progressing   Problem: Nutrition: Goal: Adequate nutrition will be maintained Outcome: Progressing   Problem: Coping: Goal: Level of anxiety will decrease Outcome: Progressing   Problem: Elimination: Goal: Will not experience complications related to bowel motility Outcome: Progressing Goal: Will not experience complications related to urinary retention Outcome: Progressing   Problem: Pain Managment: Goal: General experience of comfort will improve Outcome: Progressing   Problem: Safety: Goal: Ability to remain free from injury will improve Outcome: Progressing   Problem: Skin Integrity: Goal: Risk for impaired skin integrity will decrease Outcome: Progressing   Problem: Education: Goal: Knowledge of risk factors and measures for prevention of condition will improve Outcome: Progressing   Problem: Coping: Goal: Psychosocial and spiritual needs  will be supported Outcome: Progressing   Problem: Respiratory: Goal: Will maintain a patent airway Outcome: Progressing Goal: Complications related to the disease process, condition or treatment will be avoided or minimized Outcome: Progressing

## 2019-08-07 LAB — COMPREHENSIVE METABOLIC PANEL
ALT: 27 U/L (ref 0–44)
AST: 25 U/L (ref 15–41)
Albumin: 3.2 g/dL — ABNORMAL LOW (ref 3.5–5.0)
Alkaline Phosphatase: 59 U/L (ref 38–126)
Anion gap: 10 (ref 5–15)
BUN: 33 mg/dL — ABNORMAL HIGH (ref 8–23)
CO2: 24 mmol/L (ref 22–32)
Calcium: 8.2 mg/dL — ABNORMAL LOW (ref 8.9–10.3)
Chloride: 104 mmol/L (ref 98–111)
Creatinine, Ser: 1.12 mg/dL (ref 0.61–1.24)
GFR calc Af Amer: >60 mL/min (ref >60)
GFR calc non Af Amer: >60 mL/min (ref >60)
Glucose, Bld: 155 mg/dL — ABNORMAL HIGH (ref 70–99)
Potassium: 4.5 mmol/L (ref 3.5–5.1)
Sodium: 138 mmol/L (ref 135–145)
Total Bilirubin: 0.9 mg/dL (ref 0.3–1.2)
Total Protein: 6.6 g/dL (ref 6.5–8.1)

## 2019-08-07 LAB — FERRITIN: Ferritin: 217 ng/mL (ref 24–336)

## 2019-08-07 LAB — MAGNESIUM: Magnesium: 2 mg/dL (ref 1.7–2.4)

## 2019-08-07 LAB — CULTURE, BLOOD (ROUTINE X 2)
Culture: NO GROWTH
Special Requests: ADEQUATE

## 2019-08-07 LAB — D-DIMER, QUANTITATIVE: D-Dimer, Quant: 0.33 ug/mL-FEU (ref 0.00–0.50)

## 2019-08-07 LAB — C-REACTIVE PROTEIN: CRP: 4 mg/dL — ABNORMAL HIGH (ref <1.0)

## 2019-08-07 MED ORDER — DOCUSATE SODIUM 100 MG PO CAPS
100.0000 mg | ORAL_CAPSULE | Freq: Two times a day (BID) | ORAL | Status: DC
  Administered 2019-08-07 – 2019-08-09 (×5): 100 mg via ORAL
  Filled 2019-08-07 (×5): qty 1

## 2019-08-07 MED ORDER — POLYETHYLENE GLYCOL 3350 17 G PO PACK: 17.0000 g | PACK | Freq: Every day | ORAL | Status: DC | PRN

## 2019-08-07 NOTE — Plan of Care (Signed)
  Problem: Education: Goal: Knowledge of General Education information will improve Description: Including pain rating scale, medication(s)/side effects and non-pharmacologic comfort measures Outcome: Progressing   Problem: Education: Goal: Knowledge of General Education information will improve Description: Including pain rating scale, medication(s)/side effects and non-pharmacologic comfort measures Outcome: Progressing   Problem: Health Behavior/Discharge Planning: Goal: Ability to manage health-related needs will improve Outcome: Progressing   Problem: Clinical Measurements: Goal: Ability to maintain clinical measurements within normal limits will improve Outcome: Progressing Goal: Will remain free from infection Outcome: Progressing Goal: Diagnostic test results will improve Outcome: Progressing Goal: Respiratory complications will improve Outcome: Progressing Goal: Cardiovascular complication will be avoided Outcome: Progressing   Problem: Activity: Goal: Risk for activity intolerance will decrease Outcome: Progressing   Problem: Nutrition: Goal: Adequate nutrition will be maintained Outcome: Progressing   Problem: Coping: Goal: Level of anxiety will decrease Outcome: Progressing   Problem: Elimination: Goal: Will not experience complications related to bowel motility Outcome: Progressing Goal: Will not experience complications related to urinary retention Outcome: Progressing   Problem: Pain Managment: Goal: General experience of comfort will improve Outcome: Progressing   Problem: Safety: Goal: Ability to remain free from injury will improve Outcome: Progressing   Problem: Skin Integrity: Goal: Risk for impaired skin integrity will decrease Outcome: Progressing   Problem: Education: Goal: Knowledge of risk factors and measures for prevention of condition will improve Outcome: Progressing   Problem: Coping: Goal: Psychosocial and spiritual needs  will be supported Outcome: Progressing   Problem: Respiratory: Goal: Will maintain a patent airway Outcome: Progressing Goal: Complications related to the disease process, condition or treatment will be avoided or minimized Outcome: Progressing

## 2019-08-07 NOTE — Progress Notes (Signed)
Called and spoke with the patient's daughter [name] at this time. I updated her on the plan of care and some discharge instruction for when the patient is ready for discharge. She denied any other concerns at this time.

## 2019-08-07 NOTE — Progress Notes (Signed)
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  He was hospitalized in September 2020 due to UTI and sepsis.  He was discharged to skilled nursing facility and apparently was exposed to COVID-19 and tested positive on November 3.  Chest x-ray showed right basilar interstitial infiltrate.  Patient was noted to be hypoxic.  He was hospitalized for further management.   Reason for Visit: Pneumonia due to COVID-19  Consultants: None  Procedures: None  Antibiotics: Anti-infectives (From admission, onward)   Start     Dose/Rate Route Frequency Ordered Stop   08/06/19 1000  remdesivir 100 mg in sodium chloride 0.9 % 250 mL IVPB     100 mg 500 mL/hr over 30 Minutes Intravenous Every 24 hours 08/05/19 0822 08/10/19 0959   08/05/19 0930  remdesivir 200 mg in sodium chloride 0.9 % 250 mL IVPB     200 mg 500 mL/hr over 30 Minutes Intravenous Once 08/05/19 1962 08/05/19 1105      Subjective/Interval History: Patient states that he is feeling better.  Not as short of breath as before.  Occasional dry cough.  No chest pain.  No nausea vomiting.   Assessment/Plan:  Acute Hypoxic Resp. Failure/Pneumonia due to COVID-19  COVID-19 Labs  Recent Labs    08/06/19 0151 08/07/19 0045  DDIMER 0.39 0.33  FERRITIN 205 217  CRP 3.4* 4.0*    Lab Results  Component Value Date   SARSCOV2NAA POSITIVE (A) 08/03/2019   [place] NEGATIVE 07/01/2019   [place] NEGATIVE 06/24/2019     Fever: Has been afebrile Oxygen requirements: Currently saturating normal on room air Antibacterials: None Remdesivir: Day 3 today Steroids:  Dexamethasone 6 mg daily Diuretics: On furosemide daily Actemra: Did not receive DVT Prophylaxis: On rivaroxaban  Patient's respiratory status appears to have improved.  He saturating normal on room air.  Continue with Remdesivir steroids.  His CRP is noted to be 4.0 today.  D-dimer is normal.  Ferritin is normal.  Incentive spirometry mobilization.  PT and OT evaluation.  Chronic diastolic CHF Stable.  No evidence for volume overload.  Continue oral Lasix.  Chronic atrial fibrillation On metoprolol for rate control.  On rivaroxaban which is being continued.  History of left UPJ stone with hydronephrosis status post left nephrostomy tube Followed by Dr. Gilford Rile with urology.  Left nephrostomy tube in place.  Will need outpatient urological follow-up.  Chronic venous insufficiency He has chronic edema.  Normocytic anemia Hemoglobin is stable.  No evidence of overt bleeding.  Morbid obesity Estimated body mass index is 43.98 kg/m as calculated from the following:   Height as of this encounter: 6\' 1"  (1.854 m).   Weight as of this encounter: 151.2 kg.    DVT Prophylaxis: On rivaroxaban PUD Prophylaxis: On Protonix Code Status: Full code Family Communication: Discussed with patient Disposition Plan: Mobilize.  Hopefully discharge when he has completed his treatment course.  Home health recommended by PT.   Medications:  Scheduled: . atorvastatin  10 mg Oral q1800  . chlorpheniramine-HYDROcodone  5 mL Oral Q12H  . dexamethasone (DECADRON) injection  6 mg Intravenous Daily  . docusate sodium  100 mg Oral BID  . feeding supplement (ENSURE ENLIVE)  237 mL  Oral TID BM  . furosemide  40 mg Oral Daily  . metoprolol succinate  25 mg Oral Daily  . multivitamin with minerals  1 tablet Oral Daily  . pantoprazole  40 mg Oral Daily  . rivaroxaban  20 mg Oral Q supper  . sodium chloride flush  3 mL Intravenous Once  . sodium chloride flush  3 mL Intravenous Q12H   Continuous: .  sodium chloride    . remdesivir 100 mg in NS 250 mL Stopped (08/07/19 1009)   QQV:ZDGLOV chloride, acetaminophen, albuterol, HYDROcodone-acetaminophen, ondansetron **OR** ondansetron (ZOFRAN) IV, polyethylene glycol, sodium chloride flush   Objective:  Vital Signs  Vitals:   08/06/19 2035 08/07/19 0400 08/07/19 0732 08/07/19 0934  BP: 132/74 121/85 117/85   Pulse: 83 74 63 70  Resp: 20 (!) 21 20   Temp: 98.1 F (36.7 C)  98 F (36.7 C)   TempSrc:   Oral   SpO2: 91% 92% 93%   Weight:      Height:        Intake/Output Summary (Last 24 hours) at 08/07/2019 1138 Last data filed at 08/07/2019 1009 Gross per 24 hour  Intake 610 ml  Output 700 ml  Net -90 ml   Filed Weights   08/02/19 1857 08/03/19 1100 08/03/19 2300  Weight: (!) 153.3 kg (!) 153.3 kg (!) 151.2 kg    General appearance: Awake alert.  In no distress.  Morbidly obese Resp: Normal effort at rest.  Coarse breath sounds bilaterally with crackles at the bases.  No wheezing or rhonchi.   Cardio: S1-S2 is normal regular.  No S3-S4.  No rubs murmurs or bruit GI: Abdomen is soft.  Nontender nondistended.  Bowel sounds are present normal.  No masses organomegaly Extremities: Hyperpigmentation noted bilateral lower extremity with edema. Neurologic: Alert and oriented x3.  No focal neurological deficits.    Lab Results:  Data Reviewed: I have personally reviewed following labs and imaging studies  CBC: Recent Labs  Lab 08/02/19 1916 08/02/19 2155 08/03/19 0535 08/04/19 0418  WBC 1.9* 2.0* 1.4* 3.9*  NEUTROABS  --  1.2* 1.1* 3.1  HGB 11.4* 11.1* 11.5* 11.8*  HCT 35.9* 34.6* 35.7* 35.3*  MCV 93.5 93.5 92.0 89.4  PLT 146* 143* 125* 564    Basic Metabolic Panel: Recent Labs  Lab 08/02/19 1916 08/03/19 0535 08/04/19 0418 08/06/19 0151 08/07/19 0045  NA 137 138 142 139 138  K 4.2 4.5 4.1 4.1 4.5  CL 108 110 109 103 104  CO2 21* 20* 24 26 24   GLUCOSE 111* 144* 160* 103* 155*  BUN 12 13 14 22  33*   CREATININE 1.22 1.07 1.07 1.14 1.12  CALCIUM 8.1* 8.0* 8.3* 8.1* 8.2*  MG  --  1.8 1.8  --  2.0  PHOS  --  2.9 2.4*  --   --     GFR: Estimated Creatinine Clearance: 82 mL/min (by C-G formula based on SCr of 1.12 mg/dL).  Liver Function Tests: Recent Labs  Lab 08/02/19 2155 08/03/19 0535 08/04/19 0418 08/06/19 0151 08/07/19 0045  AST 28 25 21 22 25   ALT 17 15 14 19 27   ALKPHOS 48 53 47 54 59  BILITOT 0.9 0.7 0.5 1.1 0.9  PROT 6.4* 6.1* 6.0* 6.5 6.6  ALBUMIN 2.9* 2.7* 2.6* 3.1* 3.2*    Anemia Panel: Recent Labs    08/06/19 0151 08/07/19 0045  FERRITIN 205 217    Recent Results (from the past 240 hour(s))  Blood Culture (routine x  2)     Status: None   Collection Time: 08/02/19  9:58 PM   Specimen: BLOOD RIGHT FOREARM  Result Value Ref Range Status   Specimen Description BLOOD RIGHT FOREARM  Final   Special Requests   Final    BOTTLES DRAWN AEROBIC AND ANAEROBIC Blood Culture adequate volume   Culture   Final    NO GROWTH 5 DAYS Performed at [facility], [address], [place], [place] [address]    Report Status 08/07/2019 FINAL  Final  Blood Culture (routine x 2)     Status: None (Preliminary result)   Collection Time: 08/02/19 11:27 PM   Specimen: BLOOD  Result Value Ref Range Status   Specimen Description BLOOD LEFT ANTECUBITAL  Final   Special Requests   Final    BOTTLES DRAWN AEROBIC AND ANAEROBIC Blood Culture adequate volume   Culture   Final    NO GROWTH 4 DAYS Performed at [facility], [place] [address], [place], [place] [address]    Report Status PENDING  Incomplete  SARS CORONAVIRUS 2 (TAT 6-24 HRS) Nasopharyngeal Nasopharyngeal Swab     Status: Abnormal   Collection Time: 08/03/19  4:09 AM   Specimen: Nasopharyngeal Swab  Result Value Ref Range Status   SARS Coronavirus 2 POSITIVE (A) NEGATIVE Final    Comment: RESULT CALLED TO, READ BACK BY AND VERIFIED WITH: NADINE WELLINGTON RN.@[email or web address]  ON 11.7.2020 BY TCALDWELL MT. (NOTE)  SARS-CoV-2 target nucleic acids are DETECTED. The SARS-CoV-2 RNA is generally detectable in upper and lower respiratory specimens during the acute phase of infection. Positive results are indicative of active infection with SARS-CoV-2. Clinical  correlation with patient history and other diagnostic information is necessary to determine patient infection status. Positive results do  not rule out bacterial infection or co-infection with other viruses. The expected result is Negative. Fact Sheet for Patients: SugarRoll.be Fact Sheet for Healthcare Providers: https://www.woods-mathews.com/ This test is not yet approved or cleared by the [place] FDA and  has been authorized for detection and/or diagnosis of SARS-CoV-2 by FDA under an Emergency Use Authorization (EUA). This EUA will remain  in effect (meaning this te st can be used) for the duration of the COVID-19 declaration under Section 564(b)(1) of the Act, 21 U.S.C. section 360bbb-3(b)(1), unless the authorization is terminated or revoked sooner. Performed at [facility], [place] [address] [place], [place] [address]   MRSA PCR Screening     Status: None   Collection Time: 08/03/19  7:51 PM   Specimen: Nasopharyngeal  Result Value Ref Range Status   MRSA by PCR NEGATIVE NEGATIVE Final    Comment:        The GeneXpert MRSA Assay (FDA approved for NASAL specimens only), is one component of a comprehensive MRSA colonization surveillance program. It is not intended to diagnose MRSA infection nor to guide or monitor treatment for MRSA infections. Performed at [facility], [place] [address] [place], [place] [address]       Radiology Studies: No results found.     LOS: 4 days   Octavius Shin [employer] on [email or web address]  08/07/2019, 11:38 AM

## 2019-08-08 ENCOUNTER — Inpatient Hospital Stay (HOSPITAL_COMMUNITY): Payer: Medicare Other

## 2019-08-08 LAB — COMPREHENSIVE METABOLIC PANEL
ALT: 46 U/L — ABNORMAL HIGH (ref 0–44)
AST: 35 U/L (ref 15–41)
Albumin: 3.3 g/dL — ABNORMAL LOW (ref 3.5–5.0)
Alkaline Phosphatase: 61 U/L (ref 38–126)
Anion gap: 9 (ref 5–15)
BUN: 37 mg/dL — ABNORMAL HIGH (ref 8–23)
CO2: 27 mmol/L (ref 22–32)
Calcium: 8.6 mg/dL — ABNORMAL LOW (ref 8.9–10.3)
Chloride: 105 mmol/L (ref 98–111)
Creatinine, Ser: 1.13 mg/dL (ref 0.61–1.24)
GFR calc Af Amer: >60 mL/min (ref >60)
GFR calc non Af Amer: >60 mL/min (ref >60)
Glucose, Bld: 124 mg/dL — ABNORMAL HIGH (ref 70–99)
Potassium: 4.5 mmol/L (ref 3.5–5.1)
Sodium: 141 mmol/L (ref 135–145)
Total Bilirubin: 1 mg/dL (ref 0.3–1.2)
Total Protein: 7.1 g/dL (ref 6.5–8.1)

## 2019-08-08 LAB — CULTURE, BLOOD (ROUTINE X 2)
Culture: NO GROWTH
Special Requests: ADEQUATE

## 2019-08-08 LAB — FERRITIN: Ferritin: 236 ng/mL (ref 24–336)

## 2019-08-08 LAB — D-DIMER, QUANTITATIVE: D-Dimer, Quant: 0.33 ug/mL-FEU (ref 0.00–0.50)

## 2019-08-08 LAB — C-REACTIVE PROTEIN: CRP: 2.2 mg/dL — ABNORMAL HIGH (ref <1.0)

## 2019-08-08 NOTE — Progress Notes (Addendum)
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  He was hospitalized in September 2020 due to UTI and sepsis.  He was discharged to skilled nursing facility and apparently was exposed to COVID-19 and tested positive on November 3.  Chest x-ray showed right basilar interstitial infiltrate.  Patient was noted to be hypoxic.  He was hospitalized for further management.   Reason for Visit: Pneumonia due to COVID-19  Consultants: None  Procedures: None  Antibiotics: Anti-infectives (From admission, onward)   Start     Dose/Rate Route Frequency Ordered Stop   08/06/19 1000  remdesivir 100 mg in sodium chloride 0.9 % 250 mL IVPB     100 mg 500 mL/hr over 30 Minutes Intravenous Every 24 hours 08/05/19 0822 08/10/19 0959   08/05/19 0930  remdesivir 200 mg in sodium chloride 0.9 % 250 mL IVPB     200 mg 500 mL/hr over 30 Minutes Intravenous Once 08/05/19 7425 08/05/19 1105      Subjective/Interval History: Patient states that his breathing is improved.  He is no longer short of breath.  Minimal cough.  Denies any nausea vomiting or abdominal pain.  Urinating well.  His nephrostomy tube has not had much drainage in the last several days.     Assessment/Plan:  Pneumonia due to COVID-19  COVID-19 Labs  Recent Labs    08/06/19 0151 08/07/19 0045 08/08/19 0310  DDIMER 0.39 0.33 0.33  FERRITIN 205 217 236  CRP 3.4* 4.0* 2.2*    Lab Results  Component Value Date   SARSCOV2NAA POSITIVE (A) 08/03/2019   SARSCOV2NAA NEGATIVE 07/01/2019   [place] NEGATIVE 06/24/2019     Fever: Remains afebrile Oxygen  requirements: On room air saturating in the 90s. Antibacterials: None Remdesivir: Day 4 today Steroids: Dexamethasone 6 mg daily Diuretics: On furosemide daily Actemra: Did not receive DVT Prophylaxis: On rivaroxaban  Patient remains stable from a respiratory standpoint.  He will complete course of remdesivir tomorrow.  He is remains on steroids.  His CRP has improved to 2.2.  He is feeling better.  Continue incentive spirometry, mobilization.  PT and OT evaluation.  D-dimer is normal.  Chronic diastolic CHF Stable.  No evidence of volume overload currently.  Continue oral Lasix.  Chronic atrial fibrillation Continue metoprolol.  Continue rivaroxaban.  History of left UPJ stone with hydronephrosis status post left nephrostomy tube Patient was seen by Dr. Lovena Neighbours when he was hospitalized in September.  He is status post left nephrostomy tube placement on September 28.  Minimal drainage from the tube in the last several days.  He has an appointment with Dr. Lovena Neighbours in the next few weeks.  We will discuss with urology.  Creatinine is normal.  Patient does not have any abdominal pain.  Chronic venous insufficiency He has chronic edema.  Normocytic anemia Hemoglobin is stable.  No evidence of overt bleeding.  Morbid obesity Estimated body mass index is 43.98 kg/m as calculated from the following:   Height as of this encounter: 6\' 1"  (1.854 m).   Weight as of this encounter: 151.2 kg.    DVT Prophylaxis: On rivaroxaban PUD Prophylaxis: On Protonix Code Status: Full code Family Communication: Discussed with patient Disposition Plan: Continue  to mobilize.  Hopefully discharge tomorrow.  Will need home health PT and OT.   Medications:  Scheduled: . atorvastatin  10 mg Oral q1800  . chlorpheniramine-HYDROcodone  5 mL Oral Q12H  . dexamethasone (DECADRON) injection  6 mg Intravenous Daily  . docusate sodium  100 mg Oral BID  . feeding supplement (ENSURE ENLIVE)  237 mL Oral TID BM   . furosemide  40 mg Oral Daily  . metoprolol succinate  25 mg Oral Daily  . multivitamin with minerals  1 tablet Oral Daily  . pantoprazole  40 mg Oral Daily  . rivaroxaban  20 mg Oral Q supper  . sodium chloride flush  3 mL Intravenous Once  . sodium chloride flush  3 mL Intravenous Q12H   Continuous: . sodium chloride    . remdesivir 100 mg in NS 250 mL Stopped (08/07/19 1009)   OFH:QRFXJO chloride, acetaminophen, albuterol, HYDROcodone-acetaminophen, ondansetron **OR** ondansetron (ZOFRAN) IV, polyethylene glycol, sodium chloride flush   Objective:  Vital Signs  Vitals:   08/08/19 0358 08/08/19 0700 08/08/19 0741 08/08/19 0842  BP: 113/71   125/86  Pulse: 75 (!) 47 67 68  Resp: (!) 21 (!) 23 (!) 23 16  Temp:    97.6 F (36.4 C)  TempSrc:    Oral  SpO2: 94% 92% 92% 99%  Weight:      Height:        Intake/Output Summary (Last 24 hours) at 08/08/2019 1024 Last data filed at 08/07/2019 1908 Gross per 24 hour  Intake 840 ml  Output 700 ml  Net 140 ml   Filed Weights   08/02/19 1857 08/03/19 1100 08/03/19 2300  Weight: (!) 153.3 kg (!) 153.3 kg (!) 151.2 kg    General appearance: Awake alert.  In no distress Resp: Normal effort at rest.  Coarse breath sounds bilaterally.  No wheezing or rhonchi. Cardio: S1-S2 is normal regular.  No S3-S4.  No rubs murmurs or bruit GI: Abdomen is soft.  Nontender nondistended.  Bowel sounds are present normal.  No masses organomegaly.  Left-sided nephrostomy tube noted. Extremities: Hyperpigmented bilateral lower extremities with minimal edema. Neurologic: Alert and oriented x3.  No focal neurological deficits.     Lab Results:  Data Reviewed: I have personally reviewed following labs and imaging studies  CBC: Recent Labs  Lab 08/02/19 1916 08/02/19 2155 08/03/19 0535 08/04/19 0418  WBC 1.9* 2.0* 1.4* 3.9*  NEUTROABS  --  1.2* 1.1* 3.1  HGB 11.4* 11.1* 11.5* 11.8*  HCT 35.9* 34.6* 35.7* 35.3*  MCV 93.5 93.5 92.0 89.4   PLT 146* 143* 125* 832    Basic Metabolic Panel: Recent Labs  Lab 08/03/19 0535 08/04/19 0418 08/06/19 0151 08/07/19 0045 08/08/19 0310  NA 138 142 139 138 141  K 4.5 4.1 4.1 4.5 4.5  CL 110 109 103 104 105  CO2 20* 24 26 24 27   GLUCOSE 144* 160* 103* 155* 124*  BUN 13 14 22  33* 37*  CREATININE 1.07 1.07 1.14 1.12 1.13  CALCIUM 8.0* 8.3* 8.1* 8.2* 8.6*  MG 1.8 1.8  --  2.0  --   PHOS 2.9 2.4*  --   --   --     GFR: Estimated Creatinine Clearance: 81.3 mL/min (by C-G formula based on SCr of 1.13 mg/dL).  Liver Function Tests: Recent Labs  Lab 08/03/19 0535 08/04/19 0418 08/06/19 0151 08/07/19 0045 08/08/19 0310  AST 25 21 22 25  35  ALT 15 14 19 27  46*  ALKPHOS 53  47 54 59 61  BILITOT 0.7 0.5 1.1 0.9 1.0  PROT 6.1* 6.0* 6.5 6.6 7.1  ALBUMIN 2.7* 2.6* 3.1* 3.2* 3.3*    Anemia Panel: Recent Labs    08/07/19 0045 08/08/19 0310  FERRITIN 217 236    Recent Results (from the past 240 hour(s))  Blood Culture (routine x 2)     Status: None   Collection Time: 08/02/19  9:58 PM   Specimen: BLOOD RIGHT FOREARM  Result Value Ref Range Status   Specimen Description BLOOD RIGHT FOREARM  Final   Special Requests   Final    BOTTLES DRAWN AEROBIC AND ANAEROBIC Blood Culture adequate volume   Culture   Final    NO GROWTH 5 DAYS Performed at [facility], [place] [address], [place], [place] [address]    Report Status 08/07/2019 FINAL  Final  Blood Culture (routine x 2)     Status: None   Collection Time: 08/02/19 11:27 PM   Specimen: BLOOD  Result Value Ref Range Status   Specimen Description BLOOD LEFT ANTECUBITAL  Final   Special Requests   Final    BOTTLES DRAWN AEROBIC AND ANAEROBIC Blood Culture adequate volume   Culture   Final    NO GROWTH 5 DAYS Performed at [facility], [place] [address] [place], [place] [address]    Report Status 08/08/2019 FINAL  Final  SARS CORONAVIRUS 2 (TAT 6-24 HRS) Nasopharyngeal Nasopharyngeal Swab     Status: Abnormal    Collection Time: 08/03/19  4:09 AM   Specimen: Nasopharyngeal Swab  Result Value Ref Range Status   SARS Coronavirus 2 POSITIVE (A) NEGATIVE Final    Comment: RESULT CALLED TO, READ BACK BY AND VERIFIED WITH: NADINE WELLINGTON RN.@1538  ON 11.7.2020 BY TCALDWELL MT. (NOTE) SARS-CoV-2 target nucleic acids are DETECTED. The SARS-CoV-2 RNA is generally detectable in upper and lower respiratory specimens during the acute phase of infection. Positive results are indicative of active infection with SARS-CoV-2. Clinical  correlation with patient history and other diagnostic information is necessary to determine patient infection status. Positive results do  not rule out bacterial infection or co-infection with other viruses. The expected result is Negative. Fact Sheet for Patients: SugarRoll.be Fact Sheet for Healthcare Providers: https://www.woods-mathews.com/ This test is not yet approved or cleared by the [place] FDA and  has been authorized for detection and/or diagnosis of SARS-CoV-2 by FDA under an Emergency Use Authorization (EUA). This EUA will remain  in effect (meaning this te st can be used) for the duration of the COVID-19 declaration under Section 564(b)(1) of the Act, 21 U.S.C. section 360bbb-3(b)(1), unless the authorization is terminated or revoked sooner. Performed at [facility], [place] [address] [place], [place] [address]   MRSA PCR Screening     Status: None   Collection Time: 08/03/19  7:51 PM   Specimen: Nasopharyngeal  Result Value Ref Range Status   MRSA by PCR NEGATIVE NEGATIVE Final    Comment:        The GeneXpert MRSA Assay (FDA approved for NASAL specimens only), is one component of a comprehensive MRSA colonization surveillance program. It is not intended to diagnose MRSA infection nor to guide or monitor treatment for MRSA infections. Performed at [facility], [place] [address],  [place],  [address]       Radiology Studies: No results found.     LOS: 5 days   Dorothye Berni [employer] on [email or web address]  08/08/2019,  10:24 AM

## 2019-08-08 NOTE — Progress Notes (Signed)
Spoke with Patrick Bishop and answered all questions at this time.

## 2019-08-08 NOTE — Progress Notes (Signed)
Physical Therapy Treatment Patient Details Name: Patrick Bishop MRN: [id number] DOB: 03-19-1941 Today's Date: 08/08/2019    History of Present Illness Pt is a 78 y.o. male ILF resident admitted 08/02/19 with SOB; found to have acute hypoxic respiratory failure secondary to COVID-19 infection. PMH includes afib, CHF, OSA; of note, recent admission 05/2019 with septic sock s/p percutaneous nephrostomy tube with d/c to SNF.   PT Comments    Pt progressing with mobility. Able to transfer and ambulate short distances with RW at supervision-level, requiring intermittent seated rest breaks secondary to fatigue. Pt with hypotension noted this session, although denies dizziness (see BP values below). SpO2 100% on RA. Educ re: energy conservation, fall risk reduction, activity recommendations. Pt reports plan for d/c home tomorrow. Continue to recommend follow-up with HHPT services.  Orthostatic BPs Sitting/rest 96/79  Standing 129/100  Standing 2 min 87/64  Post-ambulation 101/90      Follow Up Recommendations  Home health PT;Supervision for mobility/OOB     Equipment Recommendations  None recommended by PT    Recommendations for Other Services       Precautions / Restrictions Precautions Precautions: Fall Precaution Comments: L-side nephrostomy tube Restrictions Weight Bearing Restrictions: No    Mobility  Bed Mobility               General bed mobility comments: Received sitting in recliner  Transfers Overall transfer level: Needs assistance Equipment used: Rolling walker (2 wheeled) Transfers: Sit to/from Stand Sit to Stand: Supervision         General transfer comment: Reliant on momentum and BUE support to stand from recliner, supervision for safety/lines; performed 3x sit<>stand  Ambulation/Gait Ambulation/Gait assistance: Supervision Gait Distance (Feet): 40 Feet Assistive device: Rolling walker (2 wheeled)(bariatric)   Gait velocity: Decreased Gait  velocity interpretation: <1.8 ft/sec, indicate of risk for recurrent falls General Gait Details: Amb 6' + 32' with RW, seated rest break in between due to hypotension (pt asymptomatic), second seated rest break due to fatigue; SpO2 100% on RA; appreciates increased support of bariatric-sized RW; supervision for safety   Stairs             Wheelchair Mobility    Modified Rankin (Stroke Patients Only)       Balance Overall balance assessment: Needs assistance Sitting-balance support: Feet supported Sitting balance-Leahy Scale: Good       Standing balance-Leahy Scale: Fair Standing balance comment: Can static stand without UE support                            Cognition Arousal/Alertness: Awake/alert Behavior During Therapy: WFL for tasks assessed/performed Overall Cognitive Status: Within Functional Limits for tasks assessed                                 General Comments: for basic functional tasks      Exercises      General Comments        Pertinent Vitals/Pain Pain Assessment: Faces Faces Pain Scale: Hurts a little bit Pain Location: R knee w/ prolonged standing Pain Descriptors / Indicators: Sore;Discomfort Pain Intervention(s): Monitored during session    Home Living                      Prior Function            PT Goals (current goals can now be found in  the care plan section) Progress towards PT goals: Progressing toward goals    Frequency    Min 3X/week      PT Plan Current plan remains appropriate    Co-evaluation              AM-PAC PT "6 Clicks" Mobility   Outcome Measure  Help needed turning from your back to your side while in a flat bed without using bedrails?: A Little Help needed moving from lying on your back to sitting on the side of a flat bed without using bedrails?: A Little Help needed moving to and from a bed to a chair (including a wheelchair)?: A Little Help needed  standing up from a chair using your arms (e.g., wheelchair or bedside chair)?: A Little Help needed to walk in hospital room?: A Little Help needed climbing 3-5 steps with a railing? : A Lot 6 Click Score: 17    End of Session   Activity Tolerance: Patient tolerated treatment well;Patient limited by fatigue Patient left: in chair;with call bell/phone within reach;with chair alarm set Nurse Communication: Mobility status PT Visit Diagnosis: Muscle weakness (generalized) (M62.81);Difficulty in walking, not elsewhere classified (R26.2)     Time: 1791-5056 PT Time Calculation (min) (ACUTE ONLY): 21 min  Charges:  $Therapeutic Activity: 8-22 mins                    Mabeline Caras, PT, DPT Acute Rehabilitation Services  Pager [phone] Office [place] 08/08/2019, 5:43 PM

## 2019-08-09 LAB — COMPREHENSIVE METABOLIC PANEL
ALT: 61 U/L — ABNORMAL HIGH (ref 0–44)
AST: 38 U/L (ref 15–41)
Albumin: 3.1 g/dL — ABNORMAL LOW (ref 3.5–5.0)
Alkaline Phosphatase: 61 U/L (ref 38–126)
Anion gap: 10 (ref 5–15)
BUN: 41 mg/dL — ABNORMAL HIGH (ref 8–23)
CO2: 23 mmol/L (ref 22–32)
Calcium: 8.5 mg/dL — ABNORMAL LOW (ref 8.9–10.3)
Chloride: 107 mmol/L (ref 98–111)
Creatinine, Ser: 1.32 mg/dL — ABNORMAL HIGH (ref 0.61–1.24)
GFR calc Af Amer: 59 mL/min — ABNORMAL LOW (ref >60)
GFR calc non Af Amer: 51 mL/min — ABNORMAL LOW (ref >60)
Glucose, Bld: 171 mg/dL — ABNORMAL HIGH (ref 70–99)
Potassium: 4.9 mmol/L (ref 3.5–5.1)
Sodium: 140 mmol/L (ref 135–145)
Total Bilirubin: 0.8 mg/dL (ref 0.3–1.2)
Total Protein: 6.7 g/dL (ref 6.5–8.1)

## 2019-08-09 LAB — D-DIMER, QUANTITATIVE: D-Dimer, Quant: <0.27 ug/mL-FEU (ref 0.00–0.50)

## 2019-08-09 LAB — FERRITIN: Ferritin: 204 ng/mL (ref 24–336)

## 2019-08-09 LAB — C-REACTIVE PROTEIN: CRP: 1.4 mg/dL — ABNORMAL HIGH (ref <1.0)

## 2019-08-09 MED ORDER — METOPROLOL SUCCINATE ER 25 MG PO TB24: 12.5000 mg | ORAL_TABLET | Freq: Every day | ORAL | Status: DC

## 2019-08-09 MED ORDER — DEXAMETHASONE 2 MG PO TABS: ORAL_TABLET | ORAL | 0 refills | Status: DC

## 2019-08-09 MED ORDER — FUROSEMIDE 40 MG PO TABS: ORAL_TABLET | ORAL | 3 refills | Status: DC

## 2019-08-09 NOTE — TOC Initial Note (Signed)
Transition of Care [facility]) - Initial/Assessment Note    Patient Details  Name: Patrick Bishop MRN: [id number] Date of Birth: [birth year]  [facility] CM/SW Contact:    Weston Anna, LCSW Phone Number: 08/09/2019, 10:08 AM  Clinical Narrative:                  Patient is a current resident at [facility] living alone. CSW spoke with patient via phone and he prefers to return home once medically stable. [facility] previously used [facility]- referral sent to [place] and accepted.   CSW will follow up with [facility] to verify that patient is able to return using [place].   Expected Discharge Plan: [place] Barriers to Discharge: Continued Medical Work up   Patient Goals and CMS Choice     Choice offered to / list presented to : Patient  Expected Discharge Plan and Services Expected Discharge Plan: [place]                                              Prior Living Arrangements/Services   Lives with:: Self                   Activities of Daily Living Home Assistive Devices/Equipment: Environmental consultant (specify type), CPAP, Shower chair without back, Other (Comment)(cane) ADL Screening (condition at time of admission) Patient's cognitive ability adequate to safely complete daily activities?: Yes Is the patient deaf or have difficulty hearing?: No Does the patient have difficulty seeing, even when wearing glasses/contacts?: Yes Does the patient have difficulty concentrating, remembering, or making decisions?: No Patient able to express need for assistance with ADLs?: Yes Does the patient have difficulty dressing or bathing?: No Independently performs ADLs?: No Communication: Independent Dressing (OT): Needs assistance Is this a change from baseline?: Pre-admission baseline Grooming: Needs assistance Is this a change from baseline?: Pre-admission baseline Feeding: Independent Bathing: Needs  assistance Is this a change from baseline?: Pre-admission baseline Toileting: Needs assistance Is this a change from baseline?: Pre-admission baseline Walks in Home: Needs assistance Does the patient have difficulty walking or climbing stairs?: Yes Weakness of Legs: Both Weakness of Arms/Hands: None  Permission Sought/Granted                  Emotional Assessment       Orientation: : Oriented to Self, Oriented to Place, Oriented to  Time, Oriented to Situation      Admission diagnosis:  Shortness of breath [R06.02] COVID-19 virus infection [U07.1] Patient Active Problem List   Diagnosis Date Noted  . Pneumonia due to COVID-19 virus 08/05/2019  . Pneumonia 08/03/2019  . Suspected COVID-19 virus infection   . Shortness of breath 08/02/2019  . Leukopenia   . Heme + stool   . Decreased hemoglobin   . Hydronephrosis with obstructing calculus 06/24/2019  . Cholelithiasis 06/24/2019  . Elevated troponin 06/24/2019  . Arthritis 07/12/2018  . Atrial fibrillation ([place]) 07/12/2018  . Chronic pain 07/12/2018  . Congestive heart failure ([place]) 07/12/2018  . Hearing loss 07/12/2018  . Lymphedema 07/12/2018  . Muscle weakness 07/12/2018  . Unsteady gait 07/12/2018  . Morbid obesity ([place]) 07/08/2018  . 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 Chronic diastolic HF (heart failure) ([place])   . Chronic venous insufficiency   .  Essential hypertension   . Longstanding persistent atrial fibrillation: CHA2DSVasc = ~3 (age x 2, HTN)   . OSA on CPAP   . Pulmonary hypertension ([place])   . Hyperlipidemia with target LDL less than 100    PCP:  Lujean Amel, MD Pharmacy:   [facility] - [address] [place] [address] Phone: [phone] Fax: [phone]  [facility] [place], [place] [address] [place] [address] Phone: [phone]  Fax: [place], [place] - [address] [place] [address] [place] [address] [place] [address] [place] [address] Phone: [phone] Fax: [phone]     Social Determinants of Health (SDOH) Interventions    Readmission Risk Interventions No flowsheet data found.

## 2019-08-09 NOTE — TOC Transition Note (Signed)
Transition of Care [facility]) - CM/SW Discharge Note   Patient Details  Name: Sylvester Minton MRN: [id number] Date of Birth: 1941-08-17  [facility] of [facility]) CM/SW Contact:  Weston Anna, LCSW Phone Number: 08/09/2019, 3:04 PM   Clinical Narrative:     Patient set to discharge back to his private apartment at [facility]- patient receives [facility] through [facility]( all orders placed by MD) and received a 3n1 by [place] (orders placed by MD). Daughter will provide transportation home.   Final next level of care: Home w Home Health Services Barriers to Discharge: No Barriers Identified   Patient Goals and CMS Choice   CMS Medicare.gov Compare Post Acute Care list provided to:: Patient Choice offered to / list presented to : Patient  Discharge Placement                Patient to be transferred to facility by: private car      Discharge Plan and Services                DME Arranged: 3-N-1 DME Agency: [employer] Date DME Agency Contacted: 08/09/19 Time DME Agency Contacted: [phone] Representative spoke with at DME Agency: [place]: PT, OT [place] Agency: [facility] [employer] Date [place]: 08/09/19 Time [place]: 1504 Representative spoke with at [place]: [place] ([place]) Interventions     Readmission Risk Interventions No flowsheet data found.

## 2019-08-09 NOTE — Discharge Summary (Signed)
[facility]  Physician Discharge Summary   Patient ID: Patrick Bishop MRN: [id number] DOB/AGE: [birth year] 78 y.o.  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 Outpatient follow-up with urology, Dr. Lovena Neighbours 2. Home health has been ordered    Home Health: Home health PT and OT Equipment/Devices: 3 n 1  CODE STATUS: Full code  DISCHARGE CONDITION: fair  Diet recommendation: As before  INITIAL HISTORY: 78 year old Caucasian male with a past medical history of atrial fibrillation, chronic diastolic CHF, obstructive sleep apnea, left percutaneous nephrostomy tube due to left UPJ stone with hydronephrosis who lives in an independent living facility presented with shortness of breath.  He was hospitalized in September 2020 due to UTI and sepsis.  He was discharged to skilled nursing facility and apparently was exposed to COVID-19 and tested positive on November 3.  Chest x-ray showed right basilar interstitial infiltrate.  Patient was noted to be hypoxic.  He was hospitalized for further management.   HOSPITAL COURSE:   Pneumonia due to COVID-19 Patient was hospitalized and placed on remdesivir and steroids.  He was saturating normal on room air.  Inflammatory markers were trended.  CRP improved.  He started feeling better.  He ambulated with physical therapy.  Home health was recommended which has been ordered.  He will be discharged on tapering doses of steroids.  Chronic diastolic CHF Continue home medication regimen.  He was stable.    Chronic atrial fibrillation Continue metoprolol.  Continue rivaroxaban.  History of left UPJ stone with  hydronephrosis status post left nephrostomy tube Patient was seen by Dr. Lovena Neighbours when he was hospitalized in September.  He is status post left nephrostomy tube placement on September 28.    Has not had much drainage from his tube.  Discussed with urology.  A CT scan was done of his abdomen.  No concern was noted in the CT report as regards to the nephrostomy tube.  Also discussed with interventional radiology since it had been more than 6 weeks since the tube was placed.  Dr. Pascal Lux stated that since the tube was stable there was no immediate need to replace it.  Especially in the setting of acute Covid.  Urology will make an appointment for the patient.  This was discussed with Dr. Junious Silk.  Patient to see Dr. Lovena Neighbours.  Patient told that if he develops problems with the tube at home to call the urology office.[name]  His creatinine is noted to be slightly elevated today which is most likely due to poor oral intake.  He is making reasonable amounts of urine. He was asked to hold his Lasix for a few days and stay well-hydrated for now.    Chronic venous insufficiency He has chronic edema.  Normocytic anemia Hemoglobin is stable.  No evidence of overt bleeding.  Morbid obesity Estimated body mass index is 43.98 kg/m as calculated from the following:   Height as of this encounter: 6\' 1"  (1.854 m).   Weight as of this encounter: 151.2 kg.  Overall stable.  Okay for discharge home today.    PERTINENT LABS:  The results of significant diagnostics from this hospitalization (including imaging, microbiology, ancillary and laboratory) are listed below for reference.    Microbiology: Recent Results (from the past  240 hour(s))  Blood Culture (routine x 2)     Status: None   Collection Time: 08/02/19  9:58 PM   Specimen: BLOOD RIGHT FOREARM  Result Value Ref Range Status   Specimen Description BLOOD RIGHT FOREARM  Final   Special Requests   Final    BOTTLES DRAWN AEROBIC AND ANAEROBIC Blood Culture  adequate volume   Culture   Final    NO GROWTH 5 DAYS Performed at [facility], [place] [address], [place], [place] [address]    Report Status 08/07/2019 FINAL  Final  Blood Culture (routine x 2)     Status: None   Collection Time: 08/02/19 11:27 PM   Specimen: BLOOD  Result Value Ref Range Status   Specimen Description BLOOD LEFT ANTECUBITAL  Final   Special Requests   Final    BOTTLES DRAWN AEROBIC AND ANAEROBIC Blood Culture adequate volume   Culture   Final    NO GROWTH 5 DAYS Performed at [facility], [place] [address] [place], [place] [address]    Report Status 08/08/2019 FINAL  Final  SARS CORONAVIRUS 2 (TAT 6-24 HRS) Nasopharyngeal Nasopharyngeal Swab     Status: Abnormal   Collection Time: 08/03/19  4:09 AM   Specimen: Nasopharyngeal Swab  Result Value Ref Range Status   SARS Coronavirus 2 POSITIVE (A) NEGATIVE Final    Comment: RESULT CALLED TO, READ BACK BY AND VERIFIED WITH: NADINE WELLINGTON RN.@[email or web address]  ON 11.7.2020 BY TCALDWELL MT. (NOTE) SARS-CoV-2 target nucleic acids are DETECTED. The SARS-CoV-2 RNA is generally detectable in upper and lower respiratory specimens during the acute phase of infection. Positive results are indicative of active infection with SARS-CoV-2. Clinical  correlation with patient history and other diagnostic information is necessary to determine patient infection status. Positive results do  not rule out bacterial infection or co-infection with other viruses. The expected result is Negative. Fact Sheet for Patients: SugarRoll.be Fact Sheet for Healthcare Providers: https://www.woods-mathews.com/ This test is not yet approved or cleared by the [place] FDA and  has been authorized for detection and/or diagnosis of SARS-CoV-2 by FDA under an Emergency Use Authorization (EUA). This EUA will remain  in effect (meaning this te st can be used) for the duration of the COVID-19 declaration  under Section 564(b)(1) of the Act, 21 U.S.C. section 360bbb-3(b)(1), unless the authorization is terminated or revoked sooner. Performed at [facility], [place] [address], [place], [place] [address]   MRSA PCR Screening     Status: None   Collection Time: 08/03/19  7:51 PM   Specimen: Nasopharyngeal  Result Value Ref Range Status   MRSA by PCR NEGATIVE NEGATIVE Final    Comment:        The GeneXpert MRSA Assay (FDA approved for NASAL specimens only), is one component of a comprehensive MRSA colonization surveillance program. It is not intended to diagnose MRSA infection nor to guide or monitor treatment for MRSA infections. Performed at [facility], [place] [address] [place], [place] [address]      Labs:  COVID-19 Labs  Recent Labs    08/07/19 0045 08/08/19 0310 08/09/19 0005  DDIMER 0.33 0.33 <0.27  FERRITIN 217 236 204  CRP 4.0* 2.2* 1.4*    Lab Results  Component Value Date   SARSCOV2NAA POSITIVE (A) 08/03/2019   [place] NEGATIVE 07/01/2019   [place] NEGATIVE 06/24/2019      Basic Metabolic Panel: Recent Labs  Lab 08/03/19 0535 08/04/19 0418 08/06/19 0151 08/07/19 0045  08/08/19 0310 08/09/19 0005  NA 138 142 139 138 141 140  K 4.5 4.1 4.1 4.5 4.5 4.9  CL 110 109 103 104 105 107  CO2 20* 24 26 24 27 23   GLUCOSE 144* 160* 103* 155* 124* 171*  BUN 13 14 22  33* 37* 41*  CREATININE 1.07 1.07 1.14 1.12 1.13 1.32*  CALCIUM 8.0* 8.3* 8.1* 8.2* 8.6* 8.5*  MG 1.8 1.8  --  2.0  --   --   PHOS 2.9 2.4*  --   --   --   --    Liver Function Tests: Recent Labs  Lab 08/04/19 0418 08/06/19 0151 08/07/19 0045 08/08/19 0310 08/09/19 0005  AST 21 22 25  35 38  ALT 14 19 27  46* 61*  ALKPHOS 47 54 59 61 61  BILITOT 0.5 1.1 0.9 1.0 0.8  PROT 6.0* 6.5 6.6 7.1 6.7  ALBUMIN 2.6* 3.1* 3.2* 3.3* 3.1*   CBC: Recent Labs  Lab 08/02/19 1916 08/02/19 2155 08/03/19 0535 08/04/19 0418  WBC 1.9* 2.0* 1.4* 3.9*  NEUTROABS  --  1.2* 1.1* 3.1    HGB 11.4* 11.1* 11.5* 11.8*  HCT 35.9* 34.6* 35.7* 35.3*  MCV 93.5 93.5 92.0 89.4  PLT 146* 143* 125* 157     IMAGING STUDIES Ct Abdomen Pelvis Wo Contrast  Result Date: 08/08/2019 CLINICAL DATA:  Urinary tract stone. EXAM: CT ABDOMEN AND PELVIS WITHOUT CONTRAST TECHNIQUE: Multidetector CT imaging of the abdomen and pelvis was performed following the standard protocol without IV contrast. COMPARISON:  June 24, 2019. FINDINGS: Lower chest: No acute abnormality. Hepatobiliary: No focal liver abnormality is seen. No gallstones, gallbladder wall thickening, or biliary dilatation. Pancreas: Unremarkable. No pancreatic ductal dilatation or surrounding inflammatory changes. Spleen: Normal in size without focal abnormality. Adrenals/Urinary Tract: Adrenal glands appear normal. Right kidney and ureter are unremarkable. Urinary bladder is unremarkable. There is been interval placement of left-sided nephrostomy which appears to enter lower pole collecting system with internal loop in the left renal pelvis. Small nonobstructive calculus is noted in lower pole collecting system of left kidney. Proximal left ureteral calculus noted on prior exam is no longer visualized. Stomach/Bowel: Stomach appears normal. There is no evidence of bowel obstruction or inflammation. The appendix is not visualized. Vascular/Lymphatic: Aortic atherosclerosis. No enlarged abdominal or pelvic lymph nodes. 3.3 cm infrarenal abdominal aortic aneurysm is noted. Stents are noted in both external iliac veins as well as in the left common iliac vein. Reproductive: Prostate is unremarkable. Other: No abdominal wall hernia or abnormality. No abdominopelvic ascites. Musculoskeletal: Severe degenerative changes seen involving the left hip joint. No acute osseous abnormality is noted. IMPRESSION: Interval placement of left-sided nephrostomy catheter which appears inter lower pole collecting system with internal loop in the left renal pelvis.  Proximal left ureteral calculus noted on prior exam is not visualized. No hydronephrosis is noted. Small nonobstructive calculus is seen in lower pole collecting system of left kidney. 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 Recommend followup by ultrasound in 3 years. This recommendation follows ACR consensus guidelines: White Paper of the ACR Incidental Findings Committee II on Vascular Findings. J Am Coll Radiol 2013; 10:789-794. Iliac vein stents are noted again. Aortic Atherosclerosis (ICD10-I70.0). Electronically Signed   By: Marijo Conception M.D.   On: 08/08/2019 14:55   Dg Abd 1 View  Result Date: 08/08/2019 CLINICAL DATA:  78 year old male with nephrostomy placement. EXAM: ABDOMEN - 1 VIEW COMPARISON:  Abdominal radiograph dated 06/26/2019. FINDINGS: Left-sided percutaneous nephrostomy with pigtail tip over  the left flank in the expected region of the left kidney. No radiopaque calculus identified. There are degenerative changes of the spine. The soft tissues are grossly unremarkable. IMPRESSION: Left-sided percutaneous nephrostomy with pigtail tip in the expected region of the left kidney. Electronically Signed   By: Anner Crete M.D.   On: 08/08/2019 11:52   Dg Chest Portable 1 View  Result Date: 08/02/2019 CLINICAL DATA:  Dyspnea, cough, COVID-19 EXAM: PORTABLE CHEST 1 VIEW COMPARISON:  06/25/2019 FINDINGS: Cardiomegaly. Unchanged right basilar scarring or atelectasis. No acute appearing airspace opacity. The visualized skeletal structures are unremarkable. IMPRESSION: 1.  Cardiomegaly. 2. Unchanged right basilar scarring or atelectasis. No acute appearing airspace opacity. Electronically Signed   By: Eddie Candle M.D.   On: 08/02/2019 21:00    DISCHARGE EXAMINATION: Vitals:   08/09/19 0400 08/09/19 0458 08/09/19 0500 08/09/19 0729  BP:  126/83  139/70  Pulse: 65 (!) 53 83 65  Resp:  18  18  Temp:  (!) 97.4 F (36.3 C)  97.6 F (36.4 C)  TempSrc:    Oral  SpO2: 96% 95% 93%  98%  Weight:      Height:       General appearance: Awake alert.  In no distress Resp: Clear to auscultation bilaterally.  Normal effort Cardio: S1-S2 is normal regular.  No S3-S4.  No rubs murmurs or bruit GI: Abdomen is soft.  Nontender nondistended.  Bowel sounds are present normal.  No masses organomegaly Extremities: No edema.  Full range of motion of lower extremities. Neurologic: Alert and oriented x3.  No focal neurological deficits.    DISPOSITION: Home with home health  Discharge Instructions    Call MD for:  difficulty breathing, headache or visual disturbances   Complete by: As directed    Call MD for:  extreme fatigue   Complete by: As directed    Call MD for:  persistant dizziness or light-headedness   Complete by: As directed    Call MD for:  persistant nausea and vomiting   Complete by: As directed    Call MD for:  severe uncontrolled pain   Complete by: As directed    Call MD for:  temperature >100.4   Complete by: As directed    Discharge instructions   Complete by: As directed    Please be sure to follow-up with urology.  Please do not take Lasix for 1 week.  Stay well-hydrated at home.  Call the urologist if you experience any issues with nephrostomy tube or if you have any other issues with urinating.  COVID 19 INSTRUCTIONS  - You are felt to be stable enough to no longer require inpatient monitoring, testing, and treatment, though you will need to follow the recommendations below: - Based on the CDC's non-test criteria for ending self-isolation: You may not return to work/leave the home until at least 21 days since symptom onset AND 3 days without a fever (without taking tylenol, ibuprofen, etc.) AND have improvement in respiratory symptoms. - Do not take NSAID medications (including, but not limited to, ibuprofen, advil, motrin, naproxen, aleve, goody's powder, etc.) - Follow up with your doctor in the next week via telehealth or seek medical attention right  away if your symptoms get WORSE.  - Consider donating plasma after you have recovered (either 14 days after a negative test or 28 days after symptoms have completely resolved) because your antibodies to this virus may be helpful to give to others with life-threatening infections. Please go to  the website www.[email or web address] if you would like to consider volunteering for plasma donation.    Directions for you at home:  Wear a facemask You should wear a facemask that covers your nose and mouth when you are in the same room with other people and when you visit a healthcare provider. People who live with or visit you should also wear a facemask while they are in the same room with you.  Separate yourself from other people in your home As much as possible, you should stay in a different room from other people in your home. Also, you should use a separate bathroom, if available.  Avoid sharing household items You should not share dishes, drinking glasses, cups, eating utensils, towels, bedding, or other items with other people in your home. After using these items, you should wash them thoroughly with soap and water.  Cover your coughs and sneezes Cover your mouth and nose with a tissue when you cough or sneeze, or you can cough or sneeze into your sleeve. Throw used tissues in a lined trash can, and immediately wash your hands with soap and water for at least 20 seconds or use an alcohol-based hand rub.  Wash your [facility] your hands often and thoroughly with soap and water for at least 20 seconds. You can use an alcohol-based hand sanitizer if soap and water are not available and if your hands are not visibly dirty. Avoid touching your eyes, nose, and mouth with unwashed hands.  Directions for those who live with, or provide care at home for you:  Limit the number of people who have contact with the patient If possible, have only one caregiver for the patient. Other household members  should stay in another home or place of residence. If this is not possible, they should stay in another room, or be separated from the patient as much as possible. Use a separate bathroom, if available. Restrict visitors who do not have an essential need to be in the home.  Ensure good ventilation Make sure that shared spaces in the home have good air flow, such as from an air conditioner or an opened window, weather permitting.  Wash your hands often Wash your hands often and thoroughly with soap and water for at least 20 seconds. You can use an alcohol based hand sanitizer if soap and water are not available and if your hands are not visibly dirty. Avoid touching your eyes, nose, and mouth with unwashed hands. Use disposable paper towels to dry your hands. If not available, use dedicated cloth towels and replace them when they become wet.  Wear a facemask and gloves Wear a disposable facemask at all times in the room and gloves when you touch or have contact with the patients blood, body fluids, and/or secretions or excretions, such as sweat, saliva, sputum, nasal mucus, vomit, urine, or feces.  Ensure the mask fits over your nose and mouth tightly, and do not touch it during use. Throw out disposable facemasks and gloves after using them. Do not reuse. Wash your hands immediately after removing your facemask and gloves. If your personal clothing becomes contaminated, carefully remove clothing and launder. Wash your hands after handling contaminated clothing. Place all used disposable facemasks, gloves, and other waste in a lined container before disposing them with other household waste. Remove gloves and wash your hands immediately after handling these items.  Do not share dishes, glasses, or other household items with the patient Avoid sharing household items. You should  not share dishes, drinking glasses, cups, eating utensils, towels, bedding, or other items with a patient who is  confirmed to have, or being evaluated for, COVID-19 infection. After the person uses these items, you should wash them thoroughly with soap and water.  Wash laundry thoroughly Immediately remove and wash clothes or bedding that have blood, body fluids, and/or secretions or excretions, such as sweat, saliva, sputum, nasal mucus, vomit, urine, or feces, on them. Wear gloves when handling laundry from the patient. Read and follow directions on labels of laundry or clothing items and detergent. In general, wash and dry with the warmest temperatures recommended on the label.  Clean all areas the individual has used often Clean all touchable surfaces, such as counters, tabletops, doorknobs, bathroom fixtures, toilets, phones, keyboards, tablets, and bedside tables, every day. Also, clean any surfaces that may have blood, body fluids, and/or secretions or excretions on them. Wear gloves when cleaning surfaces the patient has come in contact with. Use a diluted bleach solution (e.g., dilute bleach with 1 part bleach and 10 parts water) or a household disinfectant with a label that says EPA-registered for coronaviruses. To make a bleach solution at home, add 1 tablespoon of bleach to 1 quart (4 cups) of water. For a larger supply, add  cup of bleach to 1 gallon (16 cups) of water. Read labels of cleaning products and follow recommendations provided on product labels. Labels contain instructions for safe and effective use of the cleaning product including precautions you should take when applying the product, such as wearing gloves or eye protection and making sure you have good ventilation during use of the product. Remove gloves and wash hands immediately after cleaning.  Monitor yourself for signs and symptoms of illness Caregivers and household members are considered close contacts, should monitor their health, and will be asked to limit movement outside of the home to the extent possible. Follow the  monitoring steps for close contacts listed on the symptom monitoring form.   If you have additional questions, contact your local health department or call the epidemiologist on call at [phone] (available 24/7). This guidance is subject to change. For the most up-to-date guidance from [facility], please refer to their website: YouBlogs.pl   You were cared for by a hospitalist during your hospital stay. If you have any questions about your discharge medications or the care you received while you were in the hospital after you are discharged, you can call the unit and asked to speak with the hospitalist on call if the hospitalist that took care of you is not available. Once you are discharged, your primary care physician will handle any further medical issues. Please note that NO REFILLS for any discharge medications will be authorized once you are discharged, as it is imperative that you return to your primary care physician (or establish a relationship with a primary care physician if you do not have one) for your aftercare needs so that they can reassess your need for medications and monitor your lab values. If you do not have a primary care physician, you can call [phone] for a physician referral.   Increase activity slowly   Complete by: As directed         Allergies as of 08/09/2019      Reactions   Aldactone [spironolactone]    - unknown      Medication List    STOP taking these medications   metoprolol succinate 25 MG 24 hr tablet Commonly known as: TOPROL-XL  TAKE these medications   atorvastatin 10 MG tablet Commonly known as: LIPITOR TAKE 1 TABLET DAILY AT 6 P.M. What changed: See the new instructions.   dexamethasone 2 MG tablet Commonly known as: DECADRON Take 2 tablets once daily for 3 days, then 1 tablet once daily for 3 days, then STOP.   furosemide 40 MG tablet Commonly known as: LASIX STOP  TAKING FOR 1 WEEK AND THEN RESUME AS BEFORE. Take  20 mg  ( 1/2 tablet) twice a day except on Monday and Thursday take 40 mg twice a day. Start taking on: August 16, 2019 What changed:   additional instructions  These instructions start on August 16, 2019. If you are unsure what to do until then, ask your doctor or other care provider.   HYDROcodone-acetaminophen 5-325 MG tablet Commonly known as: NORCO/VICODIN Take 1 tablet by mouth 2 (two) times daily as needed for moderate pain.   magnesium oxide 400 (241.3 Mg) MG tablet Commonly known as: MAG-OX Take 1 tablet (400 mg total) by mouth daily.   pantoprazole 40 MG tablet Commonly known as: PROTONIX Take 1 tablet (40 mg total) by mouth daily.   Xarelto 20 MG Tabs tablet Generic drug: rivaroxaban TAKE 1 TABLET DAILY WITH SUPPER What changed: See the new instructions.            Durable Medical Equipment  (From admission, onward)         Start     Ordered   08/09/19 1312  For home use only DME 3 n 1  Once     08/09/19 1311           Follow-up Information    Ceasar Mons, MD Follow up.   Specialty: Urology Why: Please call his office if you do not hear anything from them by mid next week Contact information: [address] [place] [address] [phone]        Dorthy Cooler, [name], MD. Schedule an appointment as soon as possible for a visit in 1 week(s).   Specialty: Family Medicine Contact information: [place] [address] [phone]           TOTAL DISCHARGE TIME: 18 minutes  [facility] Pager on [email or web address]  08/09/2019, 1:11 PM

## 2019-08-09 NOTE — Plan of Care (Signed)
Discussed with patient plan of care for the evening, pain management and potential discharge plans with some teach back displayed.  Attempted to call contact with no answer, didn't leave a message.

## 2019-08-09 NOTE — Progress Notes (Signed)
Written and verbal instructions given to patient. Patient verbalized understanding of instructions.  Discharged patient to home via private vehicle.

## 2019-08-16 ENCOUNTER — Other Ambulatory Visit (HOSPITAL_COMMUNITY): Payer: Self-pay | Admitting: Interventional Radiology

## 2019-08-16 DIAGNOSIS — J1289 Other viral pneumonia: Secondary | ICD-10-CM | POA: Diagnosis not present

## 2019-08-16 DIAGNOSIS — N201 Calculus of ureter: Secondary | ICD-10-CM

## 2019-08-16 DIAGNOSIS — U071 COVID-19: Secondary | ICD-10-CM | POA: Diagnosis not present

## 2019-08-16 DIAGNOSIS — I10 Essential (primary) hypertension: Secondary | ICD-10-CM | POA: Diagnosis not present

## 2019-08-16 DIAGNOSIS — I509 Heart failure, unspecified: Secondary | ICD-10-CM | POA: Diagnosis not present

## 2019-08-19 ENCOUNTER — Other Ambulatory Visit: Payer: Self-pay

## 2019-08-19 ENCOUNTER — Ambulatory Visit (HOSPITAL_COMMUNITY): Admission: RE | Admit: 2019-08-19 | Payer: Medicare Other | Source: Ambulatory Visit

## 2019-08-19 DIAGNOSIS — Z20822 Contact with and (suspected) exposure to covid-19: Secondary | ICD-10-CM

## 2019-08-19 DIAGNOSIS — Z20828 Contact with and (suspected) exposure to other viral communicable diseases: Secondary | ICD-10-CM | POA: Diagnosis not present

## 2019-08-21 LAB — NOVEL CORONAVIRUS, NAA: SARS-CoV-2, NAA: NOT DETECTED

## 2019-08-27 ENCOUNTER — Encounter: Payer: Self-pay | Admitting: Cardiology

## 2019-08-27 ENCOUNTER — Ambulatory Visit (INDEPENDENT_AMBULATORY_CARE_PROVIDER_SITE_OTHER): Payer: Medicare Other | Admitting: Cardiology

## 2019-08-27 ENCOUNTER — Other Ambulatory Visit: Payer: Self-pay

## 2019-08-27 ENCOUNTER — Other Ambulatory Visit: Payer: Self-pay | Admitting: Cardiology

## 2019-08-27 VITALS — BP 116/70 | HR 59 | Temp 96.8°F | Ht 73.0 in | Wt 331.2 lb

## 2019-08-27 DIAGNOSIS — Z7189 Other specified counseling: Secondary | ICD-10-CM | POA: Insufficient documentation

## 2019-08-27 DIAGNOSIS — I272 Pulmonary hypertension, unspecified: Secondary | ICD-10-CM

## 2019-08-27 DIAGNOSIS — I10 Essential (primary) hypertension: Secondary | ICD-10-CM

## 2019-08-27 DIAGNOSIS — I4811 Longstanding persistent atrial fibrillation: Secondary | ICD-10-CM | POA: Diagnosis not present

## 2019-08-27 DIAGNOSIS — I872 Venous insufficiency (chronic) (peripheral): Secondary | ICD-10-CM | POA: Diagnosis not present

## 2019-08-27 DIAGNOSIS — Z9989 Dependence on other enabling machines and devices: Secondary | ICD-10-CM

## 2019-08-27 DIAGNOSIS — Z01810 Encounter for preprocedural cardiovascular examination: Secondary | ICD-10-CM | POA: Diagnosis not present

## 2019-08-27 DIAGNOSIS — E785 Hyperlipidemia, unspecified: Secondary | ICD-10-CM

## 2019-08-27 DIAGNOSIS — J189 Pneumonia, unspecified organism: Secondary | ICD-10-CM

## 2019-08-27 DIAGNOSIS — G4733 Obstructive sleep apnea (adult) (pediatric): Secondary | ICD-10-CM | POA: Diagnosis not present

## 2019-08-27 DIAGNOSIS — I5032 Chronic diastolic (congestive) heart failure: Secondary | ICD-10-CM | POA: Diagnosis not present

## 2019-08-27 HISTORY — DX: Pneumonia, unspecified organism: J18.9

## 2019-08-27 NOTE — Patient Instructions (Addendum)
Medication Instructions:  For 1 week  Increase  Lasix ( furosemide)  To 40 mg  Twice a day  Then return to current dose   *If you need a refill on your cardiac medications before your next appointment, please call your pharmacy*  Lab Work: Not needed  Testing/Procedures: Not needed  Follow-Up: At [facility], you and your health needs are our priority.  As part of our continuing mission to provide you with exceptional heart care, we have created designated Provider Care Teams.  These Care Teams include your primary Cardiologist (physician) and Advanced Practice Providers (APPs -  Physician Assistants and Nurse Practitioners) who all work together to provide you with the care you need, when you need it.  Your next appointment:   5 month(s)- May 2021  The format for your next appointment:   In Person  Provider:   Glenetta Hew, MD  Other Instructions  Ok to have urology surgery  ( Dr Gilford Rile)  [place] 3 DAYS prior to surgery  And 1 to 2 days after surgery .  You may need to have primary and lung doctor  The evaluate  Before surgery  After your Covid  Episode.

## 2019-08-27 NOTE — Progress Notes (Signed)
Primary Care Provider: Lujean Amel, MD Cardiologist: Patrick Hew, MD Electrophysiologist:   Clinic Note: Chief Complaint  Patient presents with  . Hospitalization Follow-up    Initially admitted with UTI-sepsis and A. fib RVR, -> then contracted COVID-19 while in rehab at skilled nursing facility.  . Atrial Fibrillation    Rate controlled, asymptomatic  . Leg Swelling    Combination of pulmonary pretension and venous stasis.    HPI:    Patrick Bishop is a 78 y.o. male with a PMH notable for chronic persistent A. fib, OSA/OHS and HFpEF with pulmonary pretension and associated bilateral edema who presents today for 1 year/hospital follow-up.  He has been relatively asymptomatic with his atrial fibrillation.  Rate controlled.  On Xarelto.  Significant bilateral edema right greater than left.  Referred to Dr. Donzetta Bishop from vascular surgery for evaluation of edema. On CPAP-followed by Dr. Claiborne Bishop.Patrick Bishop was last seen on April 9 via telemedicine--he indicated that he was doing relatively well.  No sensation of A. fib.  Swelling was pretty stable.  Difficult to keep up.  They were doing wraps off and on.  Limited exercise, not be able do much because of quarantine-increased deconditioning..  Stable exertional dyspnea with notable significant edema, but stable.  No chest pain or pressure.  Recent Hospitalizations:   June 24, 2019-A. fib RVR as complication of sepsis from UTI.  (Pyelonephritis with Proteus bacteremia-status post urostomy tube placement).  Xarelto held because of black tarry stools, restarted on discharge..  November 6-13, 2020-Covid (exposure at skilled nursing facility)-positive test November 3.  Noted to have hypoxia and bibasilar interstitial infiltrate on chest x-ray.  Admitted for management. ->  He was discharged on remdesivir and steroids.  Discharged on steroid taper.  Reviewed  CV studies:    The following studies were reviewed today: (if  available, images/films reviewed: From Epic Chart or Care Everywhere) . TTE 06/25/2019: LV normal size and function.  EF 60-65%.  Mildly reduced RV size with severely reduced function.  Despite this normal atrial size noted with just only mild right atrial enlargement with elevated RAP (dilated IVC).  Relatively poor TR jet, but findings do suggest elevated RV pressures.   Interval History:   Patrick Bishop returns here today with his daughter.  He does look considerably worn out.  He has pretty significant right greater than left lower extremity edema somewhat pitting with venous stasis.  He has baseline dyspnea but does not appear to be any more dyspneic than he would be at baseline. He does not and indicate that he is more short of breath than he would usually be, but is very deconditioned now after 2 hospitalizations and is very immobile.  Even more deconditioned than he was last visit.  He never had a chance to recover from his initial infection before he did contracted Covid while in rehab.  He is not having any chest tightness or pressure with rest or exertion.  He still uses CPAP and sleeps well quite well with it.  He said during the hospital he was pretty much all the way dried out and did not have hardly any edema but now that is the edema has built back up again since his been home.  Not using the wraps or any support hose.  He has not noted any further episodes of rapid irregular heartbeat or palpitation.  He is on Xarelto without any bleeding issues.  CV Review of Symptoms since most recent discharge (Summary) positive for -  dyspnea on exertion, edema, orthopnea and Worsening deconditioning. negative for - chest pain, irregular heartbeat, palpitations, paroxysmal nocturnal dyspnea, rapid heart rate, shortness of breath or Syncope/near syncope, TIA/amaurosis fugax.  Does not walk no claudication.  Mild positional dizziness but no loss of consciousness or balance.  The patient does not  have symptoms concerning for COVID-19 infection (fever, chills, cough, or new shortness of breath).  The patient is practicing social distancing. ++  Masking.  He does not go out for groceries or work.  Is pretty much now homebound.  Very limited activity.  REVIEWED OF SYSTEMS   A comprehensive ROS was performed. Review of Systems  Constitutional: Positive for malaise/fatigue (Has felt tired pretty much since his first hospitalization.  Gradually rebuilding energy.). Negative for weight loss (He actually says that his weight has been pretty stable since October.,  But less than it was last year.).  HENT: Negative for congestion and nosebleeds.   Respiratory: Positive for shortness of breath (Pretty much baseline). Negative for cough and wheezing.   Gastrointestinal: Negative for blood in stool and constipation.  Musculoskeletal: Positive for joint pain. Negative for falls.  Neurological: Positive for dizziness (Only if he stands up quickly) and weakness (He is legs are definitely weak.  Has not yet regained strength from his 2 hospitalizations.).  Psychiatric/Behavioral: Positive for memory loss. The patient is not nervous/anxious and does not have insomnia.    His legs ache  I have reviewed and (if needed) personally updated the patient's problem list, medications, allergies, past medical and surgical history, social and family history.   PAST MEDICAL HISTORY   Past Medical History:  Diagnosis Date  . Chronic diastolic heart failure (HCC)    Normal LV Fxn by Echo 04/2017   . Chronic venous insufficiency 2015   s/p Bilateral Iliac & Femoral Stents (including IVC) - DEEP Venous Reflux.  Also s/p Bilateral GSV Ablation.  . Essential hypertension   . Hx of adenomatous colonic polyps   . Hyperlipidemia with target LDL less than 100   . Longstanding persistent atrial fibrillation ([place])   . Nephrolithiasis 05/2019  . Obesity   . OSA on CPAP    May need New CPAP machine & CPAP MD)  .  Pulmonary hypertension ([place])   . Pyelonephritis 05/2019     PAST SURGICAL HISTORY   Past Surgical History:  Procedure Laterality Date  . ABDOMINAL VENOUS DUPLEX Bilateral 07/2014   ([place]: Dr. Cleda Mccreedy): (prior to Bilateral Iliac-Femoral Venous Stent Placement): Patent IVC.  No DVT B/L Iliac or Com fem V..  +++ DEEP V REFLUX B/L ILIAC & FEM V.  Absent Superficial V Reflux B/L x L SSV (too tortuous for intervention).  BL GSV occluded - prior ablation.   . ABDOMINAL VENOUS DUPLEX Bilateral 11/2014   Absent DVT.  Left common femoral venous reflux: 1508 MS left common femoral vein, 2284MS left proximal femoral vein -> cystic structure noted in the left popliteal vein (7.6 x 1.4 x 3.8 cm).;  Right leg deep venous reflux noted in the distal iliac, common femoral, proximal profunda femoral, proximal superficial femoral, superficial femoral and popliteal vein.  [name] CARDIAC CATHETERIZATION  10/2009   [facility] - Dr. 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  EF~45%.  Mild pulmonary pretension.  [name] CARDIAC PET  08/2009   Inferolateral partially reversible defect -- > false positive by cath  . COLONOSCOPY    . ESOPHAGOGASTRODUODENOSCOPY    .  GREATER SAPHENOUS VEIN ABLATION Bilateral Before 2015   [facility] (Dr. Eugenie Filler)  . IR NEPHROSTOMY EXCHANGE LEFT  08/28/2019  . IR NEPHROSTOMY PLACEMENT LEFT  06/24/2019  . IR TRANSCATH PLC STENT  EA ADD VEIN  INC ANGIOPLASTY Left 10/2014   [facility], Dr. 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 IR TRANSCATH PLC STENT  INITIAL VEIN  INC ANGIOPLASTY Right 08/2014   [facility] - Dr. Stormy Fabian): US Guided --> R Common & External Iliac, Common Femoral Venography with Placement of 24  mmx 70 mm Stent - R Com Iliac-Ext Iliac- & Com Fem V. reducing 60-80% venous compression to ~0%).;; also noted significant L-sided venous compression (staged stenting).   [name] NM MYOVIEW LTD  11/2005   Adenosine Myoview [facility] - Dr. Cleda Mccreedy): Normal myocardial perfusion scan. No ischemia or infarction.  . TRANSTHORACIC ECHOCARDIOGRAM  04/26/2017    [facility] Cardiology - Dr. Cleda Mccreedy) - report not available.  Per clinic note: Normal global LV function. Mild concentric temperature. Mild TR. Mild pulmonary hypertension.;;   . TRANSTHORACIC ECHOCARDIOGRAM  7/'15; 7/'17   [facility] - Dr. Saverio Danker) a) low normal LV function (EF 56%). Moderate concentric LVH. Mild LA dilation. Normal pulmonary pressures.  ;; b) normal global function - EF 60-65%. Moderate pulmonary hypertension. Biatrial enlargement. Moderate concentric LVH.  [name] TRANSTHORACIC ECHOCARDIOGRAM  02/2010   [facility] - Dr. Saverio Danker): Normal global LV function. Mild LVH. EF 60%.; November 2010. Moderate LV dilation. Moderate LVH. Normal EF 50-55%. Mild to moderate pulmonary hypertension. (July 2012 showed normal function with mild LVH, mild MR noted in August 2014)  . TRANSTHORACIC ECHOCARDIOGRAM  05/2019   During admission for UTI, sepsis with A. fib RVR: LV normal size and function.  EF 60-65%.  Mildly reduced RV size with severely reduced function.  Despite this normal atrial size noted with just only mild right atrial enlargement with elevated RAP (dilated IVC).  Relatively poor TR jet, but findings do suggest elevated RV pressures.     MEDICATIONS/ALLERGIES   Current Meds  Medication Sig  . dexamethasone (DECADRON) 2 MG tablet Take 2 tablets once daily for 3 days, then 1 tablet once daily for 3 days, then STOP.  . furosemide (LASIX) 40 MG tablet STOP TAKING FOR 1 WEEK AND THEN RESUME AS BEFORE. Take  20 mg  ( 1/2 tablet) twice a day except on Monday and Thursday take 40 mg twice a day.  [name]  HYDROcodone-acetaminophen (NORCO/VICODIN) 5-325 MG tablet Take 1 tablet by mouth 2 (two) times daily as needed for moderate pain.  . magnesium oxide (MAG-OX) 400 (241.3 Mg) MG tablet Take 1 tablet (400 mg total) by mouth daily.  . metoprolol tartrate (LOPRESSOR) 25 MG tablet Take 25 mg by mouth 2 (two) times daily. Take 25 mg tablet  in the morning and 37.5 mg ( 1 and 1/2 tablet)  in the evening  . pantoprazole (PROTONIX) 40 MG tablet Take 1 tablet (40 mg total) by mouth daily.  Alveda Reasons 20 MG TABS tablet TAKE 1 TABLET DAILY WITH SUPPER (Patient taking differently: Take 20 mg by mouth daily with supper. )  . [DISCONTINUED] atorvastatin (LIPITOR) 10 MG tablet TAKE 1 TABLET DAILY AT 6 P.M. (Patient taking differently: Take 5 mg by mouth  daily. )    Allergies  Allergen Reactions  . Aldactone [Spironolactone]     - unknown      SOCIAL HISTORY/FAMILY HISTORY   Social History   Tobacco Use  . Smoking status: Former Smoker    Packs/day: 1.00    Years: 35.00    Pack years: 35.00    Types: Cigarettes    Quit date: 1996    Years since quitting: 24.9  . Smokeless tobacco: Never Used  . Tobacco comment: smoked from age [age]   Substance Use Topics  . Alcohol use: No    Frequency: Never    Comment: Not any more  . Drug use: No   Social History   Social History Narrative   Maximilian recently moved to [place] in January 2019.  This was to be close to his daughter who is here with him today.  He has 2 daughters and one grandchild.      Jacqualine Code,  [place] --PCP was Sherlynn Carbon, MD.  Cardiologist: Saverio Danker, MD (who followed him every 3 months with routine visits.  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   He currently lives at [place] [facility] (Dr. Albesa Seen) --however he hopes to be moving into his daughter's house once they have not established and ready for him.   --He is now back home after a short stent in rehab  Family History family history includes Cancer in his mother and sister; Heart attack (age of onset: 47) in his father; Hypertension in his brother.   OBJCTIVE -PE, EKG, labs   Wt Readings from Last 3 Encounters:  08/27/19 (!) 331 lb 3.2 oz (150.2 kg)  08/03/19 (!) 333 lb 5.4 oz (151.2 kg)  07/02/19 231 lb 11.3 oz (105.1 kg)  - 231 is not accurate -more like 321-331 October 2019: Weight 366 lb  Physical Exam: BP 116/70   Pulse (!) 59   Temp (!) 96.8 F (36 C)   Ht 6\' 1"  (1.854 m)   Wt (!) 331 lb 3.2 oz (150.2 kg)   BMI 43.70 kg/m  Physical Exam  Constitutional: He is oriented to person, place, and time. No distress.  Morbidly obese, chronically ill-appearing gentleman; nontoxic, but quite worn out appearing.  Uses a rolling walker.  HENT:  Head: Normocephalic and atraumatic.  Eyes: EOM are normal.  Neck: Normal range of motion. Neck supple. No JVD (Very difficult to assess) present.  Cardiovascular: Normal rate, S1 normal and S2 normal. An irregularly irregular rhythm present. PMI is not displaced (Unable to assess). Exam reveals distant heart sounds and decreased pulses (Unable to assess pedal pulses because of edema/venous stasis.). Exam reveals no gallop.  No murmur heard. Pulmonary/Chest: Effort normal and breath sounds normal. No respiratory distress. He exhibits no tenderness.  Just distant breath sounds because of body habitus.  No wheezes, rales or rhonchi.  Abdominal: Soft. Bowel sounds are normal. He exhibits no distension. There is no abdominal tenderness.  Please.  Unable to assess HSM  Musculoskeletal:        General: Edema (Borderline anasarca from knees down.  3-4+ right, 3+ left lower extremity edema with diffuse venous stasis dermatitis changes.) present.  Neurological: He is alert and oriented to person, place, and time. No cranial nerve deficit.  Skin: Skin is warm and dry. He is not diaphoretic. There is  erythema (Diffuse brawny venous stasis change in bilateral lower extremities.  Feet are actually most purple in color.  Dry, scaly skin.).  Psychiatric: He has a normal mood and affect. His behavior is normal. Judgment and thought content normal.  Somewhat slow, deliberate speech  Vitals reviewed.    Adult ECG Report  Rate: 89 ;  Rhythm: atrial fibrillation and normal axis, intervals & durations.  Non-specific St-Tchanges.;   Narrative Interpretation: stable  Recent Labs:  -Last lipids were from April 2019: TC 107, TG 57, HDL 32, LDL 62. Lab Results  Component Value Date   TRIG 57 08/02/2019   Lab Results  Component Value Date   CREATININE 1.32 (H) 08/09/2019   BUN 41 (H) 08/09/2019   NA 140 08/09/2019   K 4.9 08/09/2019   CL 107 08/09/2019   CO2 23 08/09/2019    ASSESSMENT/PLAN    Problem List Items Addressed This Visit    Longstanding persistent atrial fibrillation: CHA2DSVasc = ~3 (age x 2, HTN) - Primary (Chronic)    Rate controlled with modest dose of metoprolol.  Remains on Xarelto without any bleeding issues.  As he is relatively asymptomatic, would not consider rhythm control.      Relevant Medications   metoprolol tartrate (LOPRESSOR) 25 MG tablet   Other Relevant Orders   EKG 12-Lead (Completed)   Morbid obesity ([place]) (Chronic)    Difficult issue.  He is lost 30 pounds since last year and his weight has been stable.  I think he lost weight while he was in the hospital.  He is gained back a lot of fluid from his Covid hospitalization, indicating that when he was laid up in bed for prolonged period time, the edema went down.  Now that he is on his feet more and sitting more the edema is getting worse.  Despite this is not really put a lot of weight which would indicate that he is probably a little bit malnourished while in the hospital.      Chronic diastolic HF (heart failure) (HCC) (Chronic)    Probably not a lot of diastolic heart failure, just some relation to  his A. fib.  Is on high dose of diuretic but blood pressure would not tolerate any further afterload reduction.      Relevant Medications   metoprolol tartrate (LOPRESSOR) 25 MG tablet   Other Relevant Orders   EKG 12-Lead (Completed)   Chronic venous insufficiency (Chronic)    He has both superficial and deep venous insufficiency longstanding with lymphedema.  He needs to get back into using the wraps and continue foot elevation.  We will have him bump up his Lasix to 40 mg twice daily for the next week and then go back to his current dosing of 2 days a week taking 40 mg twice daily.  Also okay to do additional doses as needed.  Needs to get back to wraps and elevation.      Relevant Medications   metoprolol tartrate (LOPRESSOR) 25 MG tablet   Essential hypertension (Chronic)    Really not hypertensive.  Is on low-dose beta-blocker for rate control.      Relevant Medications   metoprolol tartrate (LOPRESSOR) 25 MG tablet   OSA on CPAP (Chronic)    Tolerating well.  Maintaining stable levels of pulmonary hypertension.      Pulmonary hypertension (HCC) (Chronic)    Repeat echo showed probably mild pulmonary pretension despite being in A. fib RVR and sepsis.  No real change.  He clearly has obesity and obese hypoventilation with OSA.  Also  longstanding A. Fib.  Plan: Continue CPAP.  Blood pressures are not high enough really to consider calcium channel blocker, and with heart rate in the 50s, would not use diltiazem or verapamil.  Pressures not high enough to consider more aggressive management.  Continue current diuretic dose but he may need a little bit of a boost for about a week taking Lasix 40 mg twice daily.      Relevant Medications   metoprolol tartrate (LOPRESSOR) 25 MG tablet   Other Relevant Orders   EKG 12-Lead (Completed)   Hyperlipidemia with target LDL less than 100 (Chronic)    Followed by PCP.  On statin      Relevant Medications   metoprolol tartrate  (LOPRESSOR) 25 MG tablet   Preop cardiovascular exam    He still has a urostomy tube in place and is having issues with output.  Probably needs to have some revision done by urology.  Provided this is done in the next few months, no need for further cardiac evaluation.  Would not recommend any particular ischemic evaluation nor with another echocardiogram be helpful.  Would be okay to hold Xarelto for procedures 3-4 days preop if necessary.  But then potentially hold for 1 to 2 days postop.  My recommendation will be to have him be seen by pulmonary medicine prior to surgery for baseline evaluation.      Relevant Orders   EKG 12-Lead (Completed)      COVID-19 Education: The signs and symptoms of COVID-19 were discussed with the patient and how to seek care for testing (follow up with PCP or arrange E-visit).   The importance of social distancing was discussed today.  I spent a total of 85minutes with the patient and chart review. >  50% of the time was spent in direct patient consultation.  Additional time spent with chart review (studies, outside notes, etc): 16 Total Time: 82min   Current medicines are reviewed at length with the patient today.  (+/- concerns) none   Patient Instructions / Medication Changes & Studies & Tests Ordered   Patient Instructions  Medication Instructions:  For 1 week  Increase  Lasix ( furosemide)  To 40 mg  Twice a day  Then return to current dose   *If you need a refill on your cardiac medications before your next appointment, please call your pharmacy*  Lab Work: Not needed  Testing/Procedures: Not needed  Follow-Up: At [facility], you and your health needs are our priority.  As part of our continuing mission to provide you with exceptional heart care, we have created designated Provider Care Teams.  These Care Teams include your primary Cardiologist (physician) and Advanced Practice Providers (APPs -  Physician Assistants and Nurse  Practitioners) who all work together to provide you with the care you need, when you need it.  Your next appointment:   5 month(s)- May 2021  The format for your next appointment:   In Person  Provider:   Glenetta Hew, MD  Other Instructions  Ok to have urology surgery  ( Dr Gilford Rile)  [place] 3 DAYS prior to surgery  And 1 to 2 days after surgery .  You may need to have primary and lung doctor  The evaluate  Before surgery  After your Covid  Episode.     Studies Ordered:   Orders Placed This Encounter  Procedures  . EKG 12-Lead     Patrick Bishop, M.D., M.S. Interventional Cardiologist  Pager # [phone] Phone # [phone] [address] [place], [place] [address]   Thank you for choosing Heartcare at [facility]!!

## 2019-08-28 ENCOUNTER — Encounter (HOSPITAL_COMMUNITY): Payer: Self-pay | Admitting: Interventional Radiology

## 2019-08-28 ENCOUNTER — Other Ambulatory Visit (HOSPITAL_COMMUNITY): Payer: Self-pay | Admitting: Interventional Radiology

## 2019-08-28 ENCOUNTER — Ambulatory Visit (HOSPITAL_COMMUNITY)
Admission: RE | Admit: 2019-08-28 | Discharge: 2019-08-28 | Disposition: A | Discharge status: Home / Self Care | Payer: Medicare Other | Source: Ambulatory Visit | Attending: Interventional Radiology | Admitting: Interventional Radiology

## 2019-08-28 DIAGNOSIS — N1330 Unspecified hydronephrosis: Secondary | ICD-10-CM

## 2019-08-28 DIAGNOSIS — N2889 Other specified disorders of kidney and ureter: Secondary | ICD-10-CM | POA: Diagnosis not present

## 2019-08-28 DIAGNOSIS — N201 Calculus of ureter: Secondary | ICD-10-CM | POA: Diagnosis not present

## 2019-08-28 DIAGNOSIS — Z436 Encounter for attention to other artificial openings of urinary tract: Secondary | ICD-10-CM | POA: Insufficient documentation

## 2019-08-28 HISTORY — PX: IR NEPHROSTOMY EXCHANGE LEFT: IMG6069

## 2019-08-28 MED ORDER — LIDOCAINE HCL 1 % IJ SOLN
INTRAMUSCULAR | Status: AC
  Filled 2019-08-28: qty 20

## 2019-08-28 MED ORDER — LIDOCAINE HCL (PF) 1 % IJ SOLN
INTRAMUSCULAR | Status: DC | PRN
  Administered 2019-08-28: 5 mL

## 2019-08-28 MED ORDER — IOHEXOL 300 MG/ML  SOLN
50.0000 mL | Freq: Once | INTRAMUSCULAR | Status: AC | PRN
  Administered 2019-08-28: 10 mL

## 2019-08-29 ENCOUNTER — Encounter: Payer: Self-pay | Admitting: Cardiology

## 2019-08-29 NOTE — Assessment & Plan Note (Signed)
Tolerating well.  Maintaining stable levels of pulmonary hypertension.

## 2019-08-29 NOTE — Assessment & Plan Note (Signed)
Difficult issue.  He is lost 30 pounds since last year and his weight has been stable.  I think he lost weight while he was in the hospital.  He is gained back a lot of fluid from his Covid hospitalization, indicating that when he was laid up in bed for prolonged period time, the edema went down.  Now that he is on his feet more and sitting more the edema is getting worse.  Despite this is not really put a lot of weight which would indicate that he is probably a little bit malnourished while in the hospital.

## 2019-08-29 NOTE — Assessment & Plan Note (Signed)
He still has a urostomy tube in place and is having issues with output.  Probably needs to have some revision done by urology.  Provided this is done in the next few months, no need for further cardiac evaluation.  Would not recommend any particular ischemic evaluation nor with another echocardiogram be helpful.  Would be okay to hold Xarelto for procedures 3-4 days preop if necessary.  But then potentially hold for 1 to 2 days postop.  My recommendation will be to have him be seen by pulmonary medicine prior to surgery for baseline evaluation.

## 2019-08-29 NOTE — Assessment & Plan Note (Signed)
Repeat echo showed probably mild pulmonary pretension despite being in A. fib RVR and sepsis.  No real change.  He clearly has obesity and obese hypoventilation with OSA.  Also longstanding A. Fib.  Plan: Continue CPAP.  Blood pressures are not high enough really to consider calcium channel blocker, and with heart rate in the 50s, would not use diltiazem or verapamil.  Pressures not high enough to consider more aggressive management.  Continue current diuretic dose but he may need a little bit of a boost for about a week taking Lasix 40 mg twice daily.

## 2019-08-29 NOTE — Assessment & Plan Note (Signed)
Really not hypertensive.  Is on low-dose beta-blocker for rate control.

## 2019-08-29 NOTE — Assessment & Plan Note (Signed)
Followed by PCP.  On statin

## 2019-08-29 NOTE — Assessment & Plan Note (Signed)
Probably not a lot of diastolic heart failure, just some relation to his A. fib.  Is on high dose of diuretic but blood pressure would not tolerate any further afterload reduction.

## 2019-08-29 NOTE — Assessment & Plan Note (Signed)
He has both superficial and deep venous insufficiency longstanding with lymphedema.  He needs to get back into using the wraps and continue foot elevation.  We will have him bump up his Lasix to 40 mg twice daily for the next week and then go back to his current dosing of 2 days a week taking 40 mg twice daily.  Also okay to do additional doses as needed.  Needs to get back to wraps and elevation.

## 2019-08-29 NOTE — Assessment & Plan Note (Signed)
Rate controlled with modest dose of metoprolol.  Remains on Xarelto without any bleeding issues.  As he is relatively asymptomatic, would not consider rhythm control.

## 2019-09-03 DIAGNOSIS — I5033 Acute on chronic diastolic (congestive) heart failure: Secondary | ICD-10-CM | POA: Diagnosis not present

## 2019-09-03 DIAGNOSIS — D649 Anemia, unspecified: Secondary | ICD-10-CM | POA: Diagnosis not present

## 2019-09-03 DIAGNOSIS — G8929 Other chronic pain: Secondary | ICD-10-CM | POA: Diagnosis not present

## 2019-09-03 DIAGNOSIS — I872 Venous insufficiency (chronic) (peripheral): Secondary | ICD-10-CM | POA: Diagnosis not present

## 2019-09-03 DIAGNOSIS — K8020 Calculus of gallbladder without cholecystitis without obstruction: Secondary | ICD-10-CM | POA: Diagnosis not present

## 2019-09-03 DIAGNOSIS — G4733 Obstructive sleep apnea (adult) (pediatric): Secondary | ICD-10-CM | POA: Diagnosis not present

## 2019-09-03 DIAGNOSIS — I4811 Longstanding persistent atrial fibrillation: Secondary | ICD-10-CM | POA: Diagnosis not present

## 2019-09-03 DIAGNOSIS — Z7901 Long term (current) use of anticoagulants: Secondary | ICD-10-CM | POA: Diagnosis not present

## 2019-09-03 DIAGNOSIS — U071 COVID-19: Secondary | ICD-10-CM | POA: Diagnosis not present

## 2019-09-03 DIAGNOSIS — I890 Lymphedema, not elsewhere classified: Secondary | ICD-10-CM | POA: Diagnosis not present

## 2019-09-03 DIAGNOSIS — I272 Pulmonary hypertension, unspecified: Secondary | ICD-10-CM | POA: Diagnosis not present

## 2019-09-03 DIAGNOSIS — I110 Hypertensive heart disease with heart failure: Secondary | ICD-10-CM | POA: Diagnosis not present

## 2019-09-03 DIAGNOSIS — J1289 Other viral pneumonia: Secondary | ICD-10-CM | POA: Diagnosis not present

## 2019-09-03 DIAGNOSIS — Z6841 Body Mass Index (BMI) 40.0 and over, adult: Secondary | ICD-10-CM | POA: Diagnosis not present

## 2019-09-03 DIAGNOSIS — E785 Hyperlipidemia, unspecified: Secondary | ICD-10-CM | POA: Diagnosis not present

## 2019-09-05 DIAGNOSIS — I5033 Acute on chronic diastolic (congestive) heart failure: Secondary | ICD-10-CM | POA: Diagnosis not present

## 2019-09-05 DIAGNOSIS — I110 Hypertensive heart disease with heart failure: Secondary | ICD-10-CM | POA: Diagnosis not present

## 2019-09-05 DIAGNOSIS — I872 Venous insufficiency (chronic) (peripheral): Secondary | ICD-10-CM | POA: Diagnosis not present

## 2019-09-05 DIAGNOSIS — J1289 Other viral pneumonia: Secondary | ICD-10-CM | POA: Diagnosis not present

## 2019-09-05 DIAGNOSIS — U071 COVID-19: Secondary | ICD-10-CM | POA: Diagnosis not present

## 2019-09-05 DIAGNOSIS — I4811 Longstanding persistent atrial fibrillation: Secondary | ICD-10-CM | POA: Diagnosis not present

## 2019-09-06 DIAGNOSIS — I4811 Longstanding persistent atrial fibrillation: Secondary | ICD-10-CM | POA: Diagnosis not present

## 2019-09-06 DIAGNOSIS — U071 COVID-19: Secondary | ICD-10-CM | POA: Diagnosis not present

## 2019-09-06 DIAGNOSIS — I110 Hypertensive heart disease with heart failure: Secondary | ICD-10-CM | POA: Diagnosis not present

## 2019-09-06 DIAGNOSIS — J1289 Other viral pneumonia: Secondary | ICD-10-CM | POA: Diagnosis not present

## 2019-09-06 DIAGNOSIS — I872 Venous insufficiency (chronic) (peripheral): Secondary | ICD-10-CM | POA: Diagnosis not present

## 2019-09-06 DIAGNOSIS — I5033 Acute on chronic diastolic (congestive) heart failure: Secondary | ICD-10-CM | POA: Diagnosis not present

## 2019-09-10 DIAGNOSIS — I5033 Acute on chronic diastolic (congestive) heart failure: Secondary | ICD-10-CM | POA: Diagnosis not present

## 2019-09-10 DIAGNOSIS — I4811 Longstanding persistent atrial fibrillation: Secondary | ICD-10-CM | POA: Diagnosis not present

## 2019-09-10 DIAGNOSIS — J1289 Other viral pneumonia: Secondary | ICD-10-CM | POA: Diagnosis not present

## 2019-09-10 DIAGNOSIS — I110 Hypertensive heart disease with heart failure: Secondary | ICD-10-CM | POA: Diagnosis not present

## 2019-09-10 DIAGNOSIS — U071 COVID-19: Secondary | ICD-10-CM | POA: Diagnosis not present

## 2019-09-10 DIAGNOSIS — I872 Venous insufficiency (chronic) (peripheral): Secondary | ICD-10-CM | POA: Diagnosis not present

## 2019-09-11 ENCOUNTER — Other Ambulatory Visit: Payer: Self-pay | Admitting: Cardiology

## 2019-09-11 DIAGNOSIS — J1289 Other viral pneumonia: Secondary | ICD-10-CM | POA: Diagnosis not present

## 2019-09-11 DIAGNOSIS — I110 Hypertensive heart disease with heart failure: Secondary | ICD-10-CM | POA: Diagnosis not present

## 2019-09-11 DIAGNOSIS — I4811 Longstanding persistent atrial fibrillation: Secondary | ICD-10-CM | POA: Diagnosis not present

## 2019-09-11 DIAGNOSIS — U071 COVID-19: Secondary | ICD-10-CM | POA: Diagnosis not present

## 2019-09-11 DIAGNOSIS — I5033 Acute on chronic diastolic (congestive) heart failure: Secondary | ICD-10-CM | POA: Diagnosis not present

## 2019-09-11 DIAGNOSIS — I872 Venous insufficiency (chronic) (peripheral): Secondary | ICD-10-CM | POA: Diagnosis not present

## 2019-09-12 DIAGNOSIS — I4811 Longstanding persistent atrial fibrillation: Secondary | ICD-10-CM | POA: Diagnosis not present

## 2019-09-12 DIAGNOSIS — I110 Hypertensive heart disease with heart failure: Secondary | ICD-10-CM | POA: Diagnosis not present

## 2019-09-12 DIAGNOSIS — I5033 Acute on chronic diastolic (congestive) heart failure: Secondary | ICD-10-CM | POA: Diagnosis not present

## 2019-09-12 DIAGNOSIS — I872 Venous insufficiency (chronic) (peripheral): Secondary | ICD-10-CM | POA: Diagnosis not present

## 2019-09-12 DIAGNOSIS — U071 COVID-19: Secondary | ICD-10-CM | POA: Diagnosis not present

## 2019-09-12 DIAGNOSIS — J1289 Other viral pneumonia: Secondary | ICD-10-CM | POA: Diagnosis not present

## 2019-09-13 DIAGNOSIS — I872 Venous insufficiency (chronic) (peripheral): Secondary | ICD-10-CM | POA: Diagnosis not present

## 2019-09-13 DIAGNOSIS — I4811 Longstanding persistent atrial fibrillation: Secondary | ICD-10-CM | POA: Diagnosis not present

## 2019-09-13 DIAGNOSIS — I110 Hypertensive heart disease with heart failure: Secondary | ICD-10-CM | POA: Diagnosis not present

## 2019-09-13 DIAGNOSIS — U071 COVID-19: Secondary | ICD-10-CM | POA: Diagnosis not present

## 2019-09-13 DIAGNOSIS — J1289 Other viral pneumonia: Secondary | ICD-10-CM | POA: Diagnosis not present

## 2019-09-13 DIAGNOSIS — I5033 Acute on chronic diastolic (congestive) heart failure: Secondary | ICD-10-CM | POA: Diagnosis not present

## 2019-09-16 DIAGNOSIS — U071 COVID-19: Secondary | ICD-10-CM | POA: Diagnosis not present

## 2019-09-16 DIAGNOSIS — I4811 Longstanding persistent atrial fibrillation: Secondary | ICD-10-CM | POA: Diagnosis not present

## 2019-09-16 DIAGNOSIS — I5033 Acute on chronic diastolic (congestive) heart failure: Secondary | ICD-10-CM | POA: Diagnosis not present

## 2019-09-16 DIAGNOSIS — J1289 Other viral pneumonia: Secondary | ICD-10-CM | POA: Diagnosis not present

## 2019-09-16 DIAGNOSIS — I872 Venous insufficiency (chronic) (peripheral): Secondary | ICD-10-CM | POA: Diagnosis not present

## 2019-09-16 DIAGNOSIS — I110 Hypertensive heart disease with heart failure: Secondary | ICD-10-CM | POA: Diagnosis not present

## 2019-09-19 DIAGNOSIS — J1289 Other viral pneumonia: Secondary | ICD-10-CM | POA: Diagnosis not present

## 2019-09-19 DIAGNOSIS — I4811 Longstanding persistent atrial fibrillation: Secondary | ICD-10-CM | POA: Diagnosis not present

## 2019-09-19 DIAGNOSIS — I5033 Acute on chronic diastolic (congestive) heart failure: Secondary | ICD-10-CM | POA: Diagnosis not present

## 2019-09-19 DIAGNOSIS — U071 COVID-19: Secondary | ICD-10-CM | POA: Diagnosis not present

## 2019-09-19 DIAGNOSIS — I872 Venous insufficiency (chronic) (peripheral): Secondary | ICD-10-CM | POA: Diagnosis not present

## 2019-09-19 DIAGNOSIS — I110 Hypertensive heart disease with heart failure: Secondary | ICD-10-CM | POA: Diagnosis not present

## 2019-09-23 DIAGNOSIS — I5033 Acute on chronic diastolic (congestive) heart failure: Secondary | ICD-10-CM | POA: Diagnosis not present

## 2019-09-23 DIAGNOSIS — I110 Hypertensive heart disease with heart failure: Secondary | ICD-10-CM | POA: Diagnosis not present

## 2019-09-23 DIAGNOSIS — U071 COVID-19: Secondary | ICD-10-CM | POA: Diagnosis not present

## 2019-09-23 DIAGNOSIS — I4811 Longstanding persistent atrial fibrillation: Secondary | ICD-10-CM | POA: Diagnosis not present

## 2019-09-23 DIAGNOSIS — J1289 Other viral pneumonia: Secondary | ICD-10-CM | POA: Diagnosis not present

## 2019-09-23 DIAGNOSIS — I872 Venous insufficiency (chronic) (peripheral): Secondary | ICD-10-CM | POA: Diagnosis not present

## 2019-09-26 DIAGNOSIS — I872 Venous insufficiency (chronic) (peripheral): Secondary | ICD-10-CM | POA: Diagnosis not present

## 2019-09-26 DIAGNOSIS — I5033 Acute on chronic diastolic (congestive) heart failure: Secondary | ICD-10-CM | POA: Diagnosis not present

## 2019-09-26 DIAGNOSIS — J1289 Other viral pneumonia: Secondary | ICD-10-CM | POA: Diagnosis not present

## 2019-09-26 DIAGNOSIS — I110 Hypertensive heart disease with heart failure: Secondary | ICD-10-CM | POA: Diagnosis not present

## 2019-09-26 DIAGNOSIS — I4811 Longstanding persistent atrial fibrillation: Secondary | ICD-10-CM | POA: Diagnosis not present

## 2019-09-26 DIAGNOSIS — U071 COVID-19: Secondary | ICD-10-CM | POA: Diagnosis not present

## 2019-10-01 DIAGNOSIS — J1289 Other viral pneumonia: Secondary | ICD-10-CM | POA: Diagnosis not present

## 2019-10-01 DIAGNOSIS — U071 COVID-19: Secondary | ICD-10-CM | POA: Diagnosis not present

## 2019-10-01 DIAGNOSIS — I4811 Longstanding persistent atrial fibrillation: Secondary | ICD-10-CM | POA: Diagnosis not present

## 2019-10-01 DIAGNOSIS — I5033 Acute on chronic diastolic (congestive) heart failure: Secondary | ICD-10-CM | POA: Diagnosis not present

## 2019-10-01 DIAGNOSIS — I110 Hypertensive heart disease with heart failure: Secondary | ICD-10-CM | POA: Diagnosis not present

## 2019-10-01 DIAGNOSIS — I872 Venous insufficiency (chronic) (peripheral): Secondary | ICD-10-CM | POA: Diagnosis not present

## 2019-10-03 DIAGNOSIS — I872 Venous insufficiency (chronic) (peripheral): Secondary | ICD-10-CM | POA: Diagnosis not present

## 2019-10-03 DIAGNOSIS — I4811 Longstanding persistent atrial fibrillation: Secondary | ICD-10-CM | POA: Diagnosis not present

## 2019-10-03 DIAGNOSIS — G4733 Obstructive sleep apnea (adult) (pediatric): Secondary | ICD-10-CM | POA: Diagnosis not present

## 2019-10-03 DIAGNOSIS — J1289 Other viral pneumonia: Secondary | ICD-10-CM | POA: Diagnosis not present

## 2019-10-03 DIAGNOSIS — I890 Lymphedema, not elsewhere classified: Secondary | ICD-10-CM | POA: Diagnosis not present

## 2019-10-03 DIAGNOSIS — Z7901 Long term (current) use of anticoagulants: Secondary | ICD-10-CM | POA: Diagnosis not present

## 2019-10-03 DIAGNOSIS — D649 Anemia, unspecified: Secondary | ICD-10-CM | POA: Diagnosis not present

## 2019-10-03 DIAGNOSIS — K8020 Calculus of gallbladder without cholecystitis without obstruction: Secondary | ICD-10-CM | POA: Diagnosis not present

## 2019-10-03 DIAGNOSIS — I110 Hypertensive heart disease with heart failure: Secondary | ICD-10-CM | POA: Diagnosis not present

## 2019-10-03 DIAGNOSIS — Z6841 Body Mass Index (BMI) 40.0 and over, adult: Secondary | ICD-10-CM | POA: Diagnosis not present

## 2019-10-03 DIAGNOSIS — E785 Hyperlipidemia, unspecified: Secondary | ICD-10-CM | POA: Diagnosis not present

## 2019-10-03 DIAGNOSIS — G8929 Other chronic pain: Secondary | ICD-10-CM | POA: Diagnosis not present

## 2019-10-03 DIAGNOSIS — U071 COVID-19: Secondary | ICD-10-CM | POA: Diagnosis not present

## 2019-10-03 DIAGNOSIS — I5033 Acute on chronic diastolic (congestive) heart failure: Secondary | ICD-10-CM | POA: Diagnosis not present

## 2019-10-03 DIAGNOSIS — I272 Pulmonary hypertension, unspecified: Secondary | ICD-10-CM | POA: Diagnosis not present

## 2019-10-04 ENCOUNTER — Other Ambulatory Visit: Payer: Self-pay | Admitting: Urology

## 2019-10-07 DIAGNOSIS — R8271 Bacteriuria: Secondary | ICD-10-CM | POA: Diagnosis not present

## 2019-10-10 DIAGNOSIS — I110 Hypertensive heart disease with heart failure: Secondary | ICD-10-CM | POA: Diagnosis not present

## 2019-10-10 DIAGNOSIS — I5033 Acute on chronic diastolic (congestive) heart failure: Secondary | ICD-10-CM | POA: Diagnosis not present

## 2019-10-10 DIAGNOSIS — I4811 Longstanding persistent atrial fibrillation: Secondary | ICD-10-CM | POA: Diagnosis not present

## 2019-10-10 DIAGNOSIS — J1289 Other viral pneumonia: Secondary | ICD-10-CM | POA: Diagnosis not present

## 2019-10-10 DIAGNOSIS — U071 COVID-19: Secondary | ICD-10-CM | POA: Diagnosis not present

## 2019-10-10 DIAGNOSIS — I872 Venous insufficiency (chronic) (peripheral): Secondary | ICD-10-CM | POA: Diagnosis not present

## 2019-10-11 ENCOUNTER — Encounter (HOSPITAL_COMMUNITY): Payer: Self-pay

## 2019-10-11 NOTE — Progress Notes (Addendum)
PCP - Dorthy Cooler, [place] Cardiologist - Glenetta Hew lov 08-27-19 eoic. Note in epic 08-29-19 recommendation for pt. To see pulmonary prior to surgery for baseline  eval  Chest x-ray -  EKG - 09-02-19 epic Stress Test -  ECHO - 06-25-19  Cardiac Cath -  Ct abdomen and pelvis 08-08-19 epic  Sleep Study -  CPAP - yes  Fasting Blood Sugar -  Checks Blood Sugar _____ times a day  Blood Thinner Instructions:Xarelto may stop 3-4 days prior per Dr. 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 Patient was also instructed that they will need to review over the PAT instructions again at home before surgery.

## 2019-10-11 NOTE — Patient Instructions (Signed)
DUE TO COVID-19 ONLY ONE VISITOR IS ALLOWED TO COME WITH YOU AND STAY IN THE WAITING ROOM ONLY DURING PRE OP AND PROCEDURE DAY OF SURGERY. THE 1 VISITOR MAY VISIT WITH YOU AFTER SURGERY IN YOUR PRIVATE ROOM DURING VISITING HOURS ONLY!  YOU NEED TO HAVE A COVID 19 TEST ON_______ @_______ , THIS TEST MUST BE DONE BEFORE SURGERY, COME  [place], [address] , [address].  ([place]) ONCE YOUR COVID TEST IS COMPLETED, PLEASE BEGIN THE QUARANTINE INSTRUCTIONS AS OUTLINED IN YOUR HANDOUT.                Patrick Bishop  10/11/2019   Your procedure is scheduled on: 10-16-19   Report to [facility] Main  Entrance   Report to admitting at        [place] AM     Call this number if you have problems the morning of surgery [phone]    Remember: Do not eat food or drink liquids :After Midnight.  BRUSH YOUR TEETH MORNING OF SURGERY AND RINSE YOUR MOUTH OUT, NO CHEWING GUM CANDY OR MINTS.     Take these medicines the morning of surgery with A SIP OF WATER: metoprolol, hydrocodone if needed, keflex, amlodipine                                 You may not have any metal on your body including hair pins and              piercings  Do not wear jewelry, make-up, lotions, powders or perfumes, deodorant                   Men may shave face and neck.   Do not bring valuables to the hospital. [place].  Contacts, dentures or bridgework may not be worn into surgery.      Patients discharged the day of surgery will not be allowed to drive home. IF YOU ARE HAVING SURGERY AND GOING HOME THE SAME DAY, YOU MUST HAVE AN ADULT TO DRIVE YOU HOME AND BE WITH YOU FOR 24 HOURS. YOU MAY GO HOME BY TAXI OR UBER OR ORTHERWISE, BUT AN ADULT MUST ACCOMPANY YOU HOME AND STAY WITH YOU FOR 24 HOURS.  Name and phone number of your driver:  Special Instructions: N/A              Please read over the following fact sheets you were  given: _____________________________________________________________________             [facility] - Preparing for Surgery Before surgery, you can play an important role.  Because skin is not sterile, your skin needs to be as free of germs as possible.  You can reduce the number of germs on your skin by washing with CHG (chlorahexidine gluconate) soap before surgery.  CHG is an antiseptic cleaner which kills germs and bonds with the skin to continue killing germs even after washing. Please DO NOT use if you have an allergy to CHG or antibacterial soaps.  If your skin becomes reddened/irritated stop using the CHG and inform your nurse when you arrive at Short Stay. Do not shave (including legs and underarms) for at least 48 hours prior to the first CHG shower.  You may shave your face/neck. Please follow these instructions  carefully:  1.  Shower with CHG Soap the night before surgery and the  morning of Surgery.  2.  If you choose to wash your hair, wash your hair first as usual with your  normal  shampoo.  3.  After you shampoo, rinse your hair and body thoroughly to remove the  shampoo.                           4.  Use CHG as you would any other liquid soap.  You can apply chg directly  to the skin and wash                       Gently with a scrungie or clean washcloth.  5.  Apply the CHG Soap to your body ONLY FROM THE NECK DOWN.   Do not use on face/ open                           Wound or open sores. Avoid contact with eyes, ears mouth and genitals (private parts).                       Wash face,  Genitals (private parts) with your normal soap.             6.  Wash thoroughly, paying special attention to the area where your surgery  will be performed.  7.  Thoroughly rinse your body with warm water from the neck down.  8.  DO NOT shower/wash with your normal soap after using and rinsing off  the CHG Soap.                9.  Pat yourself dry with a clean towel.            10.  Wear  clean pajamas.            11.  Place clean sheets on your bed the night of your first shower and do not  sleep with pets. Day of Surgery : Do not apply any lotions/deodorants the morning of surgery.  Please wear clean clothes to the hospital/surgery center.  FAILURE TO FOLLOW THESE INSTRUCTIONS MAY RESULT IN THE CANCELLATION OF YOUR SURGERY PATIENT SIGNATURE_________________________________  NURSE SIGNATURE__________________________________  ________________________________________________________________________

## 2019-10-12 ENCOUNTER — Other Ambulatory Visit (HOSPITAL_COMMUNITY)
Admission: RE | Admit: 2019-10-12 | Discharge: 2019-10-12 | Disposition: A | Discharge status: Home / Self Care | Payer: Medicare Other | Source: Ambulatory Visit | Attending: Urology | Admitting: Urology

## 2019-10-12 DIAGNOSIS — Z20822 Contact with and (suspected) exposure to covid-19: Secondary | ICD-10-CM | POA: Insufficient documentation

## 2019-10-12 DIAGNOSIS — Z01812 Encounter for preprocedural laboratory examination: Secondary | ICD-10-CM | POA: Diagnosis not present

## 2019-10-13 LAB — NOVEL CORONAVIRUS, NAA (HOSP ORDER, SEND-OUT TO REF LAB; TAT 18-24 HRS): SARS-CoV-2, NAA: NOT DETECTED

## 2019-10-14 ENCOUNTER — Encounter (HOSPITAL_COMMUNITY)
Admission: RE | Admit: 2019-10-14 | Discharge: 2019-10-14 | Disposition: A | Discharge status: Home / Self Care | Payer: Medicare Other | Source: Ambulatory Visit | Attending: Urology | Admitting: Urology

## 2019-10-14 ENCOUNTER — Encounter (HOSPITAL_COMMUNITY): Payer: Self-pay

## 2019-10-14 ENCOUNTER — Other Ambulatory Visit (HOSPITAL_COMMUNITY): Payer: Medicare Other

## 2019-10-14 ENCOUNTER — Encounter (HOSPITAL_COMMUNITY): Payer: Self-pay | Admitting: Physician Assistant

## 2019-10-14 ENCOUNTER — Other Ambulatory Visit: Payer: Self-pay

## 2019-10-14 DIAGNOSIS — Z01812 Encounter for preprocedural laboratory examination: Secondary | ICD-10-CM | POA: Diagnosis not present

## 2019-10-14 DIAGNOSIS — N201 Calculus of ureter: Secondary | ICD-10-CM | POA: Diagnosis not present

## 2019-10-14 HISTORY — DX: Heart failure, unspecified: I50.9

## 2019-10-14 HISTORY — DX: Pneumonia, unspecified organism: J18.9

## 2019-10-14 HISTORY — DX: Venous insufficiency (chronic) (peripheral): I87.2

## 2019-10-14 HISTORY — DX: Cardiac arrhythmia, unspecified: I49.9

## 2019-10-14 HISTORY — DX: Unspecified osteoarthritis, unspecified site: M19.90

## 2019-10-14 HISTORY — DX: Personal history of urinary calculi: Z87.442

## 2019-10-14 LAB — CBC
HCT: 36.6 % — ABNORMAL LOW (ref 39.0–52.0)
Hemoglobin: 11.9 g/dL — ABNORMAL LOW (ref 13.0–17.0)
MCH: 30.9 pg (ref 26.0–34.0)
MCHC: 32.5 g/dL (ref 30.0–36.0)
MCV: 95.1 fL (ref 80.0–100.0)
Platelets: 228 10*3/uL (ref 150–400)
RBC: 3.85 MIL/uL — ABNORMAL LOW (ref 4.22–5.81)
RDW: 15.2 % (ref 11.5–15.5)
WBC: 6 10*3/uL (ref 4.0–10.5)
nRBC: 0 % (ref 0.0–0.2)

## 2019-10-14 LAB — BASIC METABOLIC PANEL
Anion gap: 7 (ref 5–15)
BUN: 17 mg/dL (ref 8–23)
CO2: 26 mmol/L (ref 22–32)
Calcium: 8.9 mg/dL (ref 8.9–10.3)
Chloride: 110 mmol/L (ref 98–111)
Creatinine, Ser: 1.2 mg/dL (ref 0.61–1.24)
GFR calc Af Amer: >60 mL/min (ref >60)
GFR calc non Af Amer: 57 mL/min — ABNORMAL LOW (ref >60)
Glucose, Bld: 109 mg/dL — ABNORMAL HIGH (ref 70–99)
Potassium: 4.6 mmol/L (ref 3.5–5.1)
Sodium: 143 mmol/L (ref 135–145)

## 2019-10-15 ENCOUNTER — Telehealth: Payer: Self-pay | Admitting: *Deleted

## 2019-10-15 LAB — URINE CULTURE: Culture: <10000 — AB

## 2019-10-15 NOTE — Telephone Encounter (Signed)
Received call from [facility] at [facility].   She states patient saw Dr. Ellyn Hack 08/27/19 and was suppose to be referred to a pulmonologist but this was never done and they they had to cancel his procedure (per anesthesia).     She is wondering why this referral was never completed.   Advised per chart review-patient was cleared from cardiac standpoint at [place] and per note: Preop cardiovascular exam     He still has a urostomy tube in place and is having issues with output.  Probably needs to have some revision done by urology.  Provided this is done in the next few months, no need for further cardiac evaluation.  Would not recommend any particular ischemic evaluation nor with another echocardiogram be helpful.   Would be okay to hold Xarelto for procedures 3-4 days preop if necessary.  But then potentially hold for 1 to 2 days postop.   My recommendation will be to have him be seen by pulmonary medicine prior to surgery for baseline evaluation.       Apologized for miscommunication and she states Urology will take care of the referral at this point.  Advised to call back if anything further is needed from cardiology.

## 2019-10-15 NOTE — Progress Notes (Signed)
Anesthesia Chart Review   Case: [id number] Date/Time: 10/16/19 0815   Procedure: CYSTOSCOPY/RETROGRADE/URETEROSCOPY/HOLMIUM LASER/STENT PLACEMENT/ POSSIBLE URETERAL DILATION (Left )   Anesthesia type: General   Pre-op diagnosis: LEFT URETERAL STONE   Location: [facility]   Surgeons: Ceasar Mons, MD      DISCUSSION:79 y.o. former smoker (35 pack years, quit 09/26/94) with h/o HTN, OSA on CPAP, HFpEF, OSA on CPAP, atrial fibrillation (on Eliquis), mild pulmonary HTN, left ureteral stone scheduled for above procedure 10/16/19 with Dr. Harrell Gave Lovena Neighbours.   Pt with recent hospitilization with UTI/sepsis and A-fib RVR, contracted COVID 08/03/2019 while in rehab and SNF.    Pt seen by cardiologist 08/29/2019 for preoperative evaluation.  Per OV note pt has baseline dyspnea, but appears to be at baseline, denied increased shortness of breath, very deconditioned.   "He still has a urostomy tube in place and is having issues with output.  Probably needs to have some revision done by urology.  Provided this is done in the next few months, no need for further cardiac evaluation.  Would not recommend any particular ischemic evaluation nor with another echocardiogram be helpful. Would be okay to hold Xarelto for procedures 3-4 days preop if necessary.  But then potentially hold for 1 to 2 days postop. My recommendation will be to have him be seen by pulmonary medicine prior to surgery for baseline evaluation."  Discussed with Dr. Deatra Canter, pt needs follow up with pulmonologist as recommended by cardiologist.  Discussed with Dr. Jackson Latino scheduler.  VS: There were no vitals taken for this visit.  PROVIDERS: Lujean Amel, MD is PCP   Glenetta Hew, MD is Cardiologist  LABS: Labs reviewed: Acceptable for surgery. (all labs ordered are listed, but only abnormal results are displayed)  Labs Reviewed - No data to display   IMAGES: CT Abdomen Pelvis  08/08/2019 IMPRESSION: Interval placement of left-sided nephrostomy catheter which appears inter lower pole collecting system with internal loop in the left renal pelvis. Proximal left ureteral calculus noted on prior exam is not visualized. No hydronephrosis is noted. Small nonobstructive calculus is seen in lower pole collecting system of left kidney.  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 Recommend followup by ultrasound in 3 years. This recommendation follows ACR consensus guidelines: White Paper of the ACR Incidental Findings Committee II on Vascular Findings. J Am Coll Radiol 2013; 10:789-794.  Iliac vein stents are noted again.   EKG: 09/02/2019 Rate 71 bpm Atrial fibrillation  Low voltage QRS Nonspecific T wave abnormality  CV: Echo 06/25/2019 IMPRESSIONS    1. Left ventricular ejection fraction, by visual estimation, is 60 to 65%. The left ventricle has normal function. Normal left ventricular size. There is mildly increased left ventricular hypertrophy.  2. Definity contrast agent was given IV to delineate the left ventricular endocardial borders.  3. Global right ventricle has severely reduced systolic function.The right ventricular size is mildly enlarged.  4. Left atrial size was normal.  5. Right atrial size was mildly dilated.  6. The mitral valve is normal in structure. Trace mitral valve regurgitation. No evidence of mitral stenosis.  7. The tricuspid valve is normal in structure. Tricuspid valve regurgitation is trivial.  8. The aortic valve is tricuspid Aortic valve regurgitation was not visualized by color flow Doppler. Structurally normal aortic valve, with no evidence of sclerosis or stenosis.  9. The pulmonic valve was not well visualized. Pulmonic valve regurgitation is not visualized by color flow Doppler. 10. Mildly elevated pulmonary artery  systolic pressure. 11. The inferior vena cava is dilated in size with <50% respiratory variability,  suggesting right atrial pressure of 15 mmHg. 12. Definity used; normal LV function; mild LVH; right heart not well visualized but right atrium and ventricle appear to be dilated with significant RV dysfunction. Past Medical History:  Diagnosis Date  . Arthritis   . CHF (congestive heart failure) ([place])   . Chronic diastolic heart failure (HCC)    Normal LV Fxn by Echo 04/2017   . Chronic venous insufficiency 2015   s/p Bilateral Iliac & Femoral Stents (including IVC) - DEEP Venous Reflux.  Also s/p Bilateral GSV Ablation.  [name] Dysrhythmia    a fib   . Essential hypertension   . History of kidney stones   . Hx of adenomatous colonic polyps   . Hyperlipidemia with target LDL less than 100   . Longstanding persistent atrial fibrillation ([place])   . Nephrolithiasis 05/2019  . Obesity   . OSA on CPAP    May need New CPAP machine & CPAP MD)  . Pneumonia   . Pulmonary hypertension ([place])   . Pyelonephritis 05/2019  . Venous insufficiency    LE    Past Surgical History:  Procedure Laterality Date  . ABDOMINAL VENOUS DUPLEX Bilateral 07/2014   ([place]. Cardiology: Dr. Cleda Mccreedy): (prior to Bilateral Iliac-Femoral Venous Stent Placement): Patent IVC.  No DVT B/L Iliac or Com fem V..  +++ DEEP V REFLUX B/L ILIAC & FEM V.  Absent Superficial V Reflux B/L x L SSV (too tortuous for intervention).  BL GSV occluded - prior ablation.   . ABDOMINAL VENOUS DUPLEX Bilateral 11/2014   Absent DVT.  Left common femoral venous reflux: 1508 MS left common femoral vein, 2284MS left proximal femoral vein -> cystic structure noted in the left popliteal vein (7.6 x 1.4 x 3.8 cm).;  Right leg deep venous reflux noted in the distal iliac, common femoral, proximal profunda femoral, proximal superficial femoral, superficial femoral and popliteal vein.  [name] CARDIAC CATHETERIZATION  10/2009   [facility] Cardiology - Dr. 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  EF~45%.  Mild  pulmonary pretension.  [name] CARDIAC PET  08/2009   Inferolateral partially reversible defect -- > false positive by cath  . COLONOSCOPY    . ESOPHAGOGASTRODUODENOSCOPY    . GREATER SAPHENOUS VEIN ABLATION Bilateral Before 2015   [facility] (Dr. Eugenie Filler)  . IR NEPHROSTOMY EXCHANGE LEFT  08/28/2019  . IR NEPHROSTOMY PLACEMENT LEFT  06/24/2019  . IR TRANSCATH PLC STENT  EA ADD VEIN  INC ANGIOPLASTY Left 10/2014   [facility], Dr. 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 IR TRANSCATH PLC STENT  INITIAL VEIN  INC ANGIOPLASTY Right 08/2014   [facility] - Dr. Stormy Fabian): US Guided --> R Common & External Iliac, Common Femoral Venography with Placement of 24 mmx 70 mm Stent - R Com Iliac-Ext Iliac- & Com Fem V. reducing 60-80% venous compression to ~0%).;; also noted significant L-sided venous compression (staged stenting).   [name] LIPOMA EXCISION     x 2 came back again  . NM MYOVIEW LTD  11/2005   [facility] Cardiology - Dr. Cleda Mccreedy): Normal myocardial perfusion scan. No ischemia  or infarction.  . TRANSTHORACIC ECHOCARDIOGRAM  04/26/2017    [facility] Cardiology - Dr. Cleda Mccreedy) - report not available.  Per clinic note: Normal global LV function. Mild concentric temperature. Mild TR. Mild pulmonary hypertension.;;   . TRANSTHORACIC ECHOCARDIOGRAM  7/'15; 7/'17   [facility] Cardiology - Dr. Saverio Danker) a) low normal LV function (EF 56%). Moderate concentric LVH. Mild LA dilation. Normal pulmonary pressures.  ;; b) normal global function - EF 60-65%. Moderate pulmonary hypertension. Biatrial enlargement. Moderate concentric LVH.  [name] TRANSTHORACIC ECHOCARDIOGRAM  02/2010   [facility] Cardiology - Dr. Saverio Danker): Normal global LV function. Mild LVH. EF 60%.; November 2010. Moderate LV  dilation. Moderate LVH. Normal EF 50-55%. Mild to moderate pulmonary hypertension. (July 2012 showed normal function with mild LVH, mild MR noted in August 2014)  . TRANSTHORACIC ECHOCARDIOGRAM  05/2019   During admission for UTI, sepsis with A. fib RVR: LV normal size and function.  EF 60-65%.  Mildly reduced RV size with severely reduced function.  Despite this normal atrial size noted with just only mild right atrial enlargement with elevated RAP (dilated IVC).  Relatively poor TR jet, but findings do suggest elevated RV pressures.    MEDICATIONS: No current facility-administered medications for this encounter.   [name] amLODipine (NORVASC) 10 MG tablet  . atorvastatin (LIPITOR) 10 MG tablet  . cephALEXin (KEFLEX) 500 MG capsule  . furosemide (LASIX) 40 MG tablet  . HYDROcodone-acetaminophen (NORCO/VICODIN) 5-325 MG tablet  . magnesium oxide (MAG-OX) 400 (241.3 Mg) MG tablet  . metoprolol tartrate (LOPRESSOR) 25 MG tablet  . XARELTO 20 MG TABS tablet  . dexamethasone (DECADRON) 2 MG tablet  . pantoprazole (PROTONIX) 40 MG tablet   [facility] Pre-Surgical Testing [phone] 10/15/19  2:06 PM

## 2019-10-16 ENCOUNTER — Other Ambulatory Visit: Payer: Self-pay | Admitting: Cardiology

## 2019-10-16 ENCOUNTER — Ambulatory Visit (HOSPITAL_COMMUNITY): Admission: RE | Admit: 2019-10-16 | Payer: Medicare Other | Source: Home / Self Care | Admitting: Urology

## 2019-10-16 ENCOUNTER — Encounter (HOSPITAL_COMMUNITY): Admission: RE | Payer: Self-pay | Source: Home / Self Care

## 2019-10-16 SURGERY — CYSTOSCOPY/URETEROSCOPY/HOLMIUM LASER/STENT PLACEMENT
Anesthesia: General | Laterality: Left

## 2019-10-17 DIAGNOSIS — U071 COVID-19: Secondary | ICD-10-CM | POA: Diagnosis not present

## 2019-10-17 DIAGNOSIS — I110 Hypertensive heart disease with heart failure: Secondary | ICD-10-CM | POA: Diagnosis not present

## 2019-10-17 DIAGNOSIS — J1289 Other viral pneumonia: Secondary | ICD-10-CM | POA: Diagnosis not present

## 2019-10-17 DIAGNOSIS — I872 Venous insufficiency (chronic) (peripheral): Secondary | ICD-10-CM | POA: Diagnosis not present

## 2019-10-17 DIAGNOSIS — I5033 Acute on chronic diastolic (congestive) heart failure: Secondary | ICD-10-CM | POA: Diagnosis not present

## 2019-10-17 DIAGNOSIS — I4811 Longstanding persistent atrial fibrillation: Secondary | ICD-10-CM | POA: Diagnosis not present

## 2019-10-21 ENCOUNTER — Other Ambulatory Visit: Payer: Self-pay | Admitting: Cardiology

## 2019-10-21 MED ORDER — FUROSEMIDE 40 MG PO TABS: ORAL_TABLET | ORAL | 3 refills | Status: DC

## 2019-10-21 MED ORDER — METOPROLOL TARTRATE 25 MG PO TABS: 25.0000 mg | ORAL_TABLET | ORAL | 3 refills | Status: DC

## 2019-10-21 MED ORDER — ATORVASTATIN CALCIUM 10 MG PO TABS: 10.0000 mg | ORAL_TABLET | Freq: Every day | ORAL | 3 refills | Status: DC

## 2019-10-21 MED ORDER — XARELTO 20 MG PO TABS: ORAL_TABLET | ORAL | 4 refills | Status: DC

## 2019-10-21 NOTE — Telephone Encounter (Signed)
*  STAT* If patient is at the pharmacy, call can be transferred to refill team.   1. Which medications need to be refilled? (please list name of each medication and dose if known)  Need a prescriptions for Metoprolol, Atorvastatin, Furosemide, Xarelto  2. Which pharmacy/location (including street and city if local pharmacy) is medication to be sent to? [facility]  3. Do they need a 30 day or 90 day supply?  90 days and refills

## 2019-10-21 NOTE — Telephone Encounter (Signed)
Spoke with patient and confirmed had been taking Lasix 40 mg twice a day 2 days a week and 1/2 tablet twice a day other days. Refill sent to [facility] as requested

## 2019-10-22 ENCOUNTER — Telehealth: Payer: Self-pay | Admitting: Cardiology

## 2019-10-22 NOTE — Telephone Encounter (Signed)
Patient states that [place] requires him to have a written letter stating that he is eligible to receive Covid-19 vaccination. Documentation is needed within 24 hours. Please call to confirm.   Patient included [employer] fax number: [phone]

## 2019-10-22 NOTE — Telephone Encounter (Signed)
Letter created and faxed to [phone] (verified with patient).  Patient aware and verbalized understanding.

## 2019-10-23 DIAGNOSIS — U071 COVID-19: Secondary | ICD-10-CM | POA: Diagnosis not present

## 2019-10-23 DIAGNOSIS — I5033 Acute on chronic diastolic (congestive) heart failure: Secondary | ICD-10-CM | POA: Diagnosis not present

## 2019-10-23 DIAGNOSIS — I872 Venous insufficiency (chronic) (peripheral): Secondary | ICD-10-CM | POA: Diagnosis not present

## 2019-10-23 DIAGNOSIS — J1289 Other viral pneumonia: Secondary | ICD-10-CM | POA: Diagnosis not present

## 2019-10-23 DIAGNOSIS — I4811 Longstanding persistent atrial fibrillation: Secondary | ICD-10-CM | POA: Diagnosis not present

## 2019-10-23 DIAGNOSIS — I110 Hypertensive heart disease with heart failure: Secondary | ICD-10-CM | POA: Diagnosis not present

## 2019-10-25 ENCOUNTER — Encounter: Payer: Self-pay | Admitting: Pulmonary Disease

## 2019-10-25 ENCOUNTER — Telehealth: Payer: Self-pay

## 2019-10-25 ENCOUNTER — Other Ambulatory Visit: Payer: Self-pay

## 2019-10-25 ENCOUNTER — Ambulatory Visit (INDEPENDENT_AMBULATORY_CARE_PROVIDER_SITE_OTHER): Payer: Medicare Other | Admitting: Pulmonary Disease

## 2019-10-25 VITALS — BP 124/80 | HR 62 | Temp 97.1°F | Ht 73.0 in | Wt 350.4 lb

## 2019-10-25 DIAGNOSIS — U071 COVID-19: Secondary | ICD-10-CM

## 2019-10-25 DIAGNOSIS — R0600 Dyspnea, unspecified: Secondary | ICD-10-CM

## 2019-10-25 DIAGNOSIS — J1282 Pneumonia due to coronavirus disease 2019: Secondary | ICD-10-CM

## 2019-10-25 NOTE — Patient Instructions (Signed)
From my perspective you are okay to undergo surgery We will get high-res CT and PFTs for evaluation of your lung as you continue to have some dyspnea after COVID-19 pneumonia Follow-up in 2 to 4 weeks.

## 2019-10-25 NOTE — Telephone Encounter (Signed)
Patient was seen in the office today for Urology surgical clearance. I have faxed Dr. Matilde Bash office note from today which states that he is clear for surgery. I faxed to Dr. Ellison Hughs at [phone].

## 2019-10-25 NOTE — Progress Notes (Signed)
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 Urology was consulted for for left nephrostomy tube, UJP stone with hydronephrosis.  He is being evaluated for a urologic procedure for removal of UPJ stone and is sent here for preop evaluation and clearance States that his breathing is improved since discharge from hospital but continues to have occasional dyspnea on exertion.  No cough, sputum production, fevers, chills.  Does not need supplemental oxygen.   Pets: No pets Occupation: Worked as an Runner, broadcasting/film/video and at [employer] Exposures: No known exposures.  No mold, hot tub, Jacuzzi.  No down pillows or comforter Smoking history: 35-pack-year smoker.  Quit in 1996 Travel history: No significant travel Relevant family history: No significant family history of lung disease  Outpatient Encounter Medications as of 10/25/2019  Medication Sig  . amLODipine (NORVASC) 10 MG tablet Take 10 mg by mouth daily.  [name] atorvastatin (LIPITOR) 10 MG tablet Take 1 tablet (10 mg total) by mouth daily.  . cephALEXin (KEFLEX) 500 MG capsule Take 500 mg by mouth 2 (two) times daily.  . furosemide (LASIX) 40 MG tablet Take  20 mg  ( 1/2 tablet) twice a day except on Monday and Thursday take 40 mg twice a day.  [name] HYDROcodone-acetaminophen (NORCO/VICODIN) 5-325 MG tablet Take 1 tablet by mouth 2 (two) times daily as needed for moderate pain.  . magnesium oxide (MAG-OX) 400 (241.3 Mg) MG tablet Take 1  tablet (400 mg total) by mouth daily.  . metoprolol tartrate (LOPRESSOR) 25 MG tablet Take 1 tablet (25 mg total) by mouth See admin instructions. Take 25 mg by mouth in the morning and 37.5 mg in the evening  . XARELTO 20 MG TABS tablet TAKE 1 TABLET DAILY WITH SUPPER  . [DISCONTINUED] dexamethasone (DECADRON) 2 MG tablet Take 2 tablets once daily for 3 days, then 1 tablet once daily for 3 days, then STOP. (Patient not taking: Reported on 10/09/2019)  . [DISCONTINUED] pantoprazole (PROTONIX) 40 MG tablet Take 1 tablet (40 mg total) by mouth daily. (Patient not taking: Reported on 10/09/2019)   No facility-administered encounter medications on file as of 10/25/2019.    Allergies as of 10/25/2019 - Review Complete 10/25/2019  Allergen Reaction Noted  . Aldactone [spironolactone]  11/14/2017    Past Medical History:  Diagnosis Date  . Arthritis   . CHF (congestive heart failure) ([place])   . Chronic diastolic heart failure (H[facility])    Normal LV Fxn by Echo 04/2017   . Chronic venous insufficiency 2015   s/p Bilateral Iliac & Femoral Stents (including IVC) - DEEP Venous Reflux.  Also s/p Bilateral GSV Ablation.  [name] Dysrhythmia    a fib   . Essential hypertension   . History of kidney stones   . Hx of adenomatous colonic polyps   . Hyperlipidemia with target LDL less than 100   . Longstanding persistent atrial fibrillation ([place])   . Nephrolithiasis 05/2019  . Obesity   . OSA on CPAP    May need New CPAP machine & CPAP  MD)  . Pneumonia   . Pulmonary hypertension ([place])   . Pyelonephritis 05/2019  . Venous insufficiency    LE    Past Surgical History:  Procedure Laterality Date  . ABDOMINAL VENOUS DUPLEX Bilateral 07/2014   ([place]iology: Dr. Cleda Mccreedy): (prior to Bilateral Iliac-Femoral Venous Stent Placement): Patent IVC.  No DVT B/L Iliac or Com fem V..  +++ DEEP V REFLUX B/L ILIAC & FEM V.  Absent Superficial V Reflux B/L x L SSV (too tortuous for intervention).  BL GSV occluded - prior  ablation.   . ABDOMINAL VENOUS DUPLEX Bilateral 11/2014   Absent DVT.  Left common femoral venous reflux: 1508 MS left common femoral vein, 2284MS left proximal femoral vein -> cystic structure noted in the left popliteal vein (7.6 x 1.4 x 3.8 cm).;  Right leg deep venous reflux noted in the distal iliac, common femoral, proximal profunda femoral, proximal superficial femoral, superficial femoral and popliteal vein.  [name] CARDIAC CATHETERIZATION  10/2009   [facility] - Dr. 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  EF~45%.  Mild pulmonary pretension.  [name] CARDIAC PET  08/2009   Inferolateral partially reversible defect -- > false positive by cath  . COLONOSCOPY    . ESOPHAGOGASTRODUODENOSCOPY    . GREATER SAPHENOUS VEIN ABLATION Bilateral Before 2015   [facility] (Dr. Eugenie Filler)  . IR NEPHROSTOMY EXCHANGE LEFT  08/28/2019  . IR NEPHROSTOMY PLACEMENT LEFT  06/24/2019  . IR TRANSCATH PLC STENT  EA ADD VEIN  INC ANGIOPLASTY Left 10/2014   [facility], Dr. 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 IR TRANSCATH PLC STENT  INITIAL VEIN  INC ANGIOPLASTY Right 08/2014   [facility] - Dr. Stormy Fabian): US Guided --> R Common & External Iliac, Common Femoral Venography with Placement of 24 mmx 70 mm Stent - R Com Iliac-Ext Iliac- & Com Fem V. reducing 60-80% venous compression to ~0%).;; also noted significant L-sided venous compression (staged stenting).   [name] LIPOMA EXCISION     x 2 came back again  . NM MYOVIEW LTD  11/2005   Adenosine Myoview [facility] - Dr. Cleda Mccreedy): Normal myocardial perfusion scan. No ischemia or infarction.  . TRANSTHORACIC ECHOCARDIOGRAM  04/26/2017    [facility] - Dr. Cleda Mccreedy) - report not  available.  Per clinic note: Normal global LV function. Mild concentric temperature. Mild TR. Mild pulmonary hypertension.;;   . TRANSTHORACIC ECHOCARDIOGRAM  7/'15; 7/'17   [facility] - Dr. Saverio Danker) a) low normal LV function (EF 56%). Moderate concentric LVH. Mild LA dilation. Normal pulmonary pressures.  ;; b) normal global function - EF 60-65%. Moderate pulmonary hypertension. Biatrial enlargement. Moderate concentric LVH.  [name] TRANSTHORACIC ECHOCARDIOGRAM  02/2010   [facility] - Dr. Saverio Danker): Normal global LV function. Mild LVH. EF 60%.; November 2010. Moderate LV dilation. Moderate LVH. Normal EF 50-55%. Mild to moderate pulmonary hypertension. (July 2012 showed normal function with mild LVH, mild MR noted in August 2014)  . TRANSTHORACIC ECHOCARDIOGRAM  05/2019   During admission for UTI, sepsis with A. fib RVR: LV normal size and function.  EF 60-65%.  Mildly reduced RV size with severely reduced function.  Despite this normal atrial size noted with just  only mild right atrial enlargement with elevated RAP (dilated IVC).  Relatively poor TR jet, but findings do suggest elevated RV pressures.    Family History  Problem Relation Age of Onset  . Cancer Mother   . Heart attack Father 41  . Hypertension Brother   . Cancer Sister   . Other Neg Hx        He really does not know much about his parents and siblings health, but they did not speak about healthcare    Social History   Socioeconomic History  . Marital status: Widowed    Spouse name: Not on file  . Number of children: 2  . Years of education: Not on file  . Highest education level: Some college, no degree  Occupational History  . 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 Occupation: Lobbyist: [employer]    Comment: followed by several odd jobs  . Occupation: Retired    Comment: After several odd jobs  Tobacco Use  . Smoking status: Former Smoker      Packs/day: 1.00    Years: 35.00    Pack years: 35.00    Types: Cigarettes    Quit date: 1996    Years since quitting: 25.0  . Smokeless tobacco: Never Used  . Tobacco comment: smoked from age [age]   Substance and Sexual Activity  . Alcohol use: No    Comment: Not any more  . Drug use: No  . Sexual activity: Not Currently  Other Topics Concern  . Not on file  Social History Narrative   Bryam recently moved to [place] in January 2019.  This was to be close to his daughter who is here with him today.  He has 2 daughters and one grandchild.      Jacqualine Code,  [place] --PCP was Sherlynn Carbon, MD.  Cardiologist: Saverio Danker, MD (who followed him every 3 months with routine visits.  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   He currently lives at [place] (Dr. Albesa Seen) --however he hopes to be moving into his daughter's house once they have not established and ready for him.   Social Determinants of Health   Financial Resource Strain:   . Difficulty of Paying Living Expenses: Not on file  Food Insecurity:   . Worried About Charity fundraiser in the Last Year: Not on file  . Ran Out of Food in the Last Year: Not on file  Transportation Needs:   . Lack of Transportation (Medical): Not on file  . Lack of Transportation (Non-Medical): Not on file  Physical Activity:   . Days of Exercise per Week: Not on file  . Minutes of Exercise per Session: Not on file  Stress:   . Feeling of Stress : Not on file  Social Connections:   . Frequency of Communication with Friends and Family: Not on file  . Frequency of Social Gatherings with Friends and Family: Not on file  . Attends Religious Services: Not on file  . Active Member of Clubs or Organizations: Not on file  . Attends Archivist Meetings: Not on file  . Marital Status: Not on file  Intimate  Partner Violence:   . Fear of Current or Ex-Partner: Not on file  . Emotionally Abused: Not on file  . Physically Abused: Not on  file  . Sexually Abused: Not on file    Review of systems: Review of Systems  Constitutional: Negative for fever and chills.  HENT: Negative.   Eyes: Negative for blurred vision.  Respiratory: as per HPI  Cardiovascular: Negative for chest pain and palpitations.  Gastrointestinal: Negative for vomiting, diarrhea, blood per rectum. Genitourinary: Negative for dysuria, urgency, frequency and hematuria.  Musculoskeletal: Negative for myalgias, back pain and joint pain.  Skin: Negative for itching and rash.  Neurological: Negative for dizziness, tremors, focal weakness, seizures and loss of consciousness.  Endo/Heme/Allergies: Negative for environmental allergies.  Psychiatric/Behavioral: Negative for depression, suicidal ideas and hallucinations.  All other systems reviewed and are negative.  Physical Exam: Blood pressure 124/80, pulse 62, temperature (!) 97.1 F (36.2 C), temperature source Temporal, height 6\' 1"  (1.854 m), weight (!) 350 lb 6.4 oz (158.9 kg), SpO2 97 %. Gen:      No acute distress HEENT:  EOMI, sclera anicteric Neck:     No masses; no thyromegaly Lungs:    Clear to auscultation bilaterally; normal respiratory effort CV:         Regular rate and rhythm; no murmurs Abd:      + bowel sounds; soft, non-tender; no palpable masses, no distension Ext:    No edema; adequate peripheral perfusion Skin:      Warm and dry; no rash Neuro: alert and oriented x 3 Psych: normal mood and affect  Data Reviewed: Imaging: Chest x-ray 08/02/2019-cardiomegaly, right basilar scarring or atelectasis.  CT abdomen pelvis 08/08/2019-no abnormality in the visualized lung base.  Left nephrostomy catheter.  No hydronephrosis.  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  I have reviewed the images personally.  Assessment:  Post COVID-19 pneumonia Appears to  making slow improvement but has residual symptoms CT abdomen pelvis during admission for COVID-19 does not show any clear interstitial process However as he has persistent symptoms we will get CT and PFTs for further evaluation This work-up need not delay his planned surgical procedure  Clearance for urologic procedure At increased risk with age and smoking history however there are no contraindications to procedure I am not sure if the procedure is being planned under general anesthesia.  Would recommend conscious sedation if possible Recommend early mobilization, use of incentive spirometer.  Plan/Recommendations: - Cleared for surgical procedure - CT, PFTs.  Marshell Garfinkel MD [facility] 10/25/2019, 8:39 AM  CC: Lujean Amel, MD

## 2019-10-29 ENCOUNTER — Other Ambulatory Visit: Payer: Self-pay | Admitting: Urology

## 2019-10-30 ENCOUNTER — Other Ambulatory Visit (HOSPITAL_COMMUNITY): Payer: Medicare Other

## 2019-10-31 DIAGNOSIS — I110 Hypertensive heart disease with heart failure: Secondary | ICD-10-CM | POA: Diagnosis not present

## 2019-10-31 DIAGNOSIS — J1289 Other viral pneumonia: Secondary | ICD-10-CM | POA: Diagnosis not present

## 2019-10-31 DIAGNOSIS — I872 Venous insufficiency (chronic) (peripheral): Secondary | ICD-10-CM | POA: Diagnosis not present

## 2019-10-31 DIAGNOSIS — U071 COVID-19: Secondary | ICD-10-CM | POA: Diagnosis not present

## 2019-10-31 DIAGNOSIS — I5033 Acute on chronic diastolic (congestive) heart failure: Secondary | ICD-10-CM | POA: Diagnosis not present

## 2019-10-31 DIAGNOSIS — I4811 Longstanding persistent atrial fibrillation: Secondary | ICD-10-CM | POA: Diagnosis not present

## 2019-11-02 DIAGNOSIS — E785 Hyperlipidemia, unspecified: Secondary | ICD-10-CM | POA: Diagnosis not present

## 2019-11-02 DIAGNOSIS — K8020 Calculus of gallbladder without cholecystitis without obstruction: Secondary | ICD-10-CM | POA: Diagnosis not present

## 2019-11-02 DIAGNOSIS — I872 Venous insufficiency (chronic) (peripheral): Secondary | ICD-10-CM | POA: Diagnosis not present

## 2019-11-02 DIAGNOSIS — G8929 Other chronic pain: Secondary | ICD-10-CM | POA: Diagnosis not present

## 2019-11-02 DIAGNOSIS — G4733 Obstructive sleep apnea (adult) (pediatric): Secondary | ICD-10-CM | POA: Diagnosis not present

## 2019-11-02 DIAGNOSIS — Z8616 Personal history of COVID-19: Secondary | ICD-10-CM | POA: Diagnosis not present

## 2019-11-02 DIAGNOSIS — Z7901 Long term (current) use of anticoagulants: Secondary | ICD-10-CM | POA: Diagnosis not present

## 2019-11-02 DIAGNOSIS — Z8701 Personal history of pneumonia (recurrent): Secondary | ICD-10-CM | POA: Diagnosis not present

## 2019-11-02 DIAGNOSIS — Z6841 Body Mass Index (BMI) 40.0 and over, adult: Secondary | ICD-10-CM | POA: Diagnosis not present

## 2019-11-02 DIAGNOSIS — I890 Lymphedema, not elsewhere classified: Secondary | ICD-10-CM | POA: Diagnosis not present

## 2019-11-02 DIAGNOSIS — Z436 Encounter for attention to other artificial openings of urinary tract: Secondary | ICD-10-CM | POA: Diagnosis not present

## 2019-11-02 DIAGNOSIS — I4811 Longstanding persistent atrial fibrillation: Secondary | ICD-10-CM | POA: Diagnosis not present

## 2019-11-02 DIAGNOSIS — D649 Anemia, unspecified: Secondary | ICD-10-CM | POA: Diagnosis not present

## 2019-11-02 DIAGNOSIS — I272 Pulmonary hypertension, unspecified: Secondary | ICD-10-CM | POA: Diagnosis not present

## 2019-11-02 DIAGNOSIS — I5033 Acute on chronic diastolic (congestive) heart failure: Secondary | ICD-10-CM | POA: Diagnosis not present

## 2019-11-02 DIAGNOSIS — I110 Hypertensive heart disease with heart failure: Secondary | ICD-10-CM | POA: Diagnosis not present

## 2019-11-02 DIAGNOSIS — Z9181 History of falling: Secondary | ICD-10-CM | POA: Diagnosis not present

## 2019-11-04 ENCOUNTER — Ambulatory Visit
Admission: RE | Admit: 2019-11-04 | Discharge: 2019-11-04 | Disposition: A | Discharge status: Home / Self Care | Payer: Medicare Other | Source: Ambulatory Visit | Attending: Pulmonary Disease | Admitting: Pulmonary Disease

## 2019-11-04 DIAGNOSIS — J1282 Pneumonia due to coronavirus disease 2019: Secondary | ICD-10-CM

## 2019-11-04 DIAGNOSIS — R0600 Dyspnea, unspecified: Secondary | ICD-10-CM

## 2019-11-04 DIAGNOSIS — J9811 Atelectasis: Secondary | ICD-10-CM | POA: Diagnosis not present

## 2019-11-04 DIAGNOSIS — R918 Other nonspecific abnormal finding of lung field: Secondary | ICD-10-CM | POA: Diagnosis not present

## 2019-11-04 NOTE — Patient Instructions (Addendum)
DUE TO COVID-19 ONLY ONE VISITOR IS ALLOWED TO COME WITH YOU AND STAY IN THE WAITING ROOM ONLY DURING PRE OP AND PROCEDURE DAY OF SURGERY. THE 1 VISITOR MAY VISIT WITH YOU AFTER SURGERY IN YOUR PRIVATE ROOM DURING VISITING HOURS ONLY!                Arseniy Toomey   Your procedure is scheduled on: 11/06/19   Report to   [facility] Main  Entrance   Report to admitting at: 7:30 AM     Call this number if you have problems the morning of surgery [phone]    Remember:   Do not eat food or drink liquids :After Midnight.   BRUSH YOUR TEETH MORNING OF SURGERY AND RINSE YOUR MOUTH OUT, NO CHEWING GUM CANDY OR MINTS.     Take these medicines the morning of surgery with A SIP OF WATER: Amlodipine,metoprolol               You may not have any metal on your body including hair pins and              piercings  Do not wear jewelry,lotions, powders or perfumes, deodorant             Men may shave face and neck.   Do not bring valuables to the hospital. [place].  Contacts, dentures or bridgework may not be worn into surgery.  Leave suitcase in the car. After surgery it may be brought to your room.     Patients discharged the day of surgery will not be allowed to drive home. IF YOU ARE HAVING SURGERY AND GOING HOME THE SAME DAY, YOU MUST HAVE AN ADULT TO DRIVE YOU HOME AND BE WITH YOU FOR 24 HOURS. YOU MAY GO HOME BY TAXI OR UBER OR ORTHERWISE, BUT AN ADULT MUST ACCOMPANY YOU HOME AND STAY WITH YOU FOR 24 HOURS.  Name and phone number of your driver:  Special Instructions: N/A              Please read over the following fact sheets you were given: _____________________________________________________________________               [facility] - Preparing for Surgery Before surgery, you can play an important role.  Because skin is not sterile, your skin needs to be as free of germs as possible.  You can reduce the number  of germs on your skin by washing with CHG (chlorahexidine gluconate) soap before surgery.  CHG is an antiseptic cleaner which kills germs and bonds with the skin to continue killing germs even after washing. Please DO NOT use if you have an allergy to CHG or antibacterial soaps.  If your skin becomes reddened/irritated stop using the CHG and inform your nurse when you arrive at Short Stay. Do not shave (including legs and underarms) for at least 48 hours prior to the first CHG shower.  You may shave your face/neck. Please follow these instructions carefully:  1.  Shower with CHG Soap the night before surgery and the  morning of Surgery.  2.  If you choose to wash your hair, wash your hair first as usual with your  normal  shampoo.  3.  After you shampoo, rinse your hair and body thoroughly to remove the  shampoo.  4.  Use CHG as you would any other liquid soap.  You can apply chg directly  to the skin and wash                       Gently with a scrungie or clean washcloth.  5.  Apply the CHG Soap to your body ONLY FROM THE NECK DOWN.   Do not use on face/ open                           Wound or open sores. Avoid contact with eyes, ears mouth and genitals (private parts).                       Wash face,  Genitals (private parts) with your normal soap.             6.  Wash thoroughly, paying special attention to the area where your surgery  will be performed.  7.  Thoroughly rinse your body with warm water from the neck down.  8.  DO NOT shower/wash with your normal soap after using and rinsing off  the CHG Soap.                9.  Pat yourself dry with a clean towel.            10.  Wear clean pajamas.            11.  Place clean sheets on your bed the night of your first shower and do not  sleep with pets. Day of Surgery : Do not apply any lotions/deodorants the morning of surgery.  Please wear clean clothes to the hospital/surgery center.  FAILURE TO FOLLOW THESE  INSTRUCTIONS MAY RESULT IN THE CANCELLATION OF YOUR SURGERY PATIENT SIGNATURE_________________________________  NURSE SIGNATURE__________________________________  ________________________________________________________________________

## 2019-11-05 ENCOUNTER — Encounter (HOSPITAL_COMMUNITY)
Admission: RE | Admit: 2019-11-05 | Discharge: 2019-11-05 | Disposition: A | Discharge status: Home / Self Care | Payer: Medicare Other | Source: Ambulatory Visit | Attending: Urology | Admitting: Urology

## 2019-11-05 ENCOUNTER — Other Ambulatory Visit: Payer: Self-pay

## 2019-11-05 ENCOUNTER — Encounter (HOSPITAL_COMMUNITY): Payer: Self-pay

## 2019-11-05 DIAGNOSIS — Z01812 Encounter for preprocedural laboratory examination: Secondary | ICD-10-CM | POA: Diagnosis not present

## 2019-11-05 HISTORY — DX: Dyspnea, unspecified: R06.00

## 2019-11-05 HISTORY — DX: Prediabetes: R73.03

## 2019-11-05 LAB — BASIC METABOLIC PANEL
Anion gap: 9 (ref 5–15)
BUN: 26 mg/dL — ABNORMAL HIGH (ref 8–23)
CO2: 25 mmol/L (ref 22–32)
Calcium: 9.1 mg/dL (ref 8.9–10.3)
Chloride: 109 mmol/L (ref 98–111)
Creatinine, Ser: 1.32 mg/dL — ABNORMAL HIGH (ref 0.61–1.24)
GFR calc Af Amer: 59 mL/min — ABNORMAL LOW (ref >60)
GFR calc non Af Amer: 51 mL/min — ABNORMAL LOW (ref >60)
Glucose, Bld: 118 mg/dL — ABNORMAL HIGH (ref 70–99)
Potassium: 4 mmol/L (ref 3.5–5.1)
Sodium: 143 mmol/L (ref 135–145)

## 2019-11-05 LAB — CBC
HCT: 36.9 % — ABNORMAL LOW (ref 39.0–52.0)
Hemoglobin: 11.5 g/dL — ABNORMAL LOW (ref 13.0–17.0)
MCH: 29.6 pg (ref 26.0–34.0)
MCHC: 31.2 g/dL (ref 30.0–36.0)
MCV: 94.9 fL (ref 80.0–100.0)
Platelets: 218 10*3/uL (ref 150–400)
RBC: 3.89 MIL/uL — ABNORMAL LOW (ref 4.22–5.81)
RDW: 13.8 % (ref 11.5–15.5)
WBC: 6.4 10*3/uL (ref 4.0–10.5)
nRBC: 0 % (ref 0.0–0.2)

## 2019-11-05 NOTE — Progress Notes (Signed)
Anesthesia Chart Review   Case: [id number] Date: 10/16/19   Procedure: CYSTOSCOPY/RETROGRADE/URETEROSCOPY/HOLMIUM LASER/STENT PLACEMENT/ POSSIBLE URETERAL DILATION (Left ) (canceled)   Anesthesia type: General   Pre-op diagnosis: LEFT URETERAL STONE   Location: [facility]   Surgeons: Ceasar Mons, MD      DISCUSSION:79 y.o. former smoker (35 pack years, quit 09/26/94) with h/o HTN, OSA on CPAP, HFpEF, OSA on CPAP, atrial fibrillation (on Eliquis), mild pulmonary HTN, left ureteral stone scheduled for above procedure 10/16/19 with Dr. Harrell Gave Lovena Neighbours.   Pt with recent hospitilization with UTI/sepsis and A-fib RVR, contracted COVID 08/03/2019 while in rehab and SNF.    Pt seen by cardiologist 08/29/2019 for preoperative evaluation.  Per OV note pt has baseline dyspnea, but appears to be at baseline, denied increased shortness of breath, very deconditioned.   "He still has a urostomy tube in place and is having issues with output.  Probably needs to have some revision done by urology.  Provided this is done in the next few months, no need for further cardiac evaluation.  Would not recommend any particular ischemic evaluation nor with another echocardiogram be helpful. Would be okay to hold Xarelto for procedures 3-4 days preop if necessary.  But then potentially hold for 1 to 2 days postop. My recommendation will be to have him be seen by pulmonary medicine prior to surgery for baseline evaluation."  Discussed with Dr. Deatra Canter, pt needs follow up with pulmonologist as recommended by cardiologist.  Discussed with Dr. Jackson Latino scheduler.   Addendum 11/05/2019:  Pt seen by pulmonologist 10/25/19.  Per OV note, "At increased risk with age and smoking history however there are no contraindications to procedure I am not sure if the procedure is being planned under general anesthesia.  Would recommend conscious sedation if possible. Recommend early mobilization, use of incentive  spirometer."   VS: There were no vitals taken for this visit.  PROVIDERS: Lujean Amel, MD is PCP   Glenetta Hew, MD is Cardiologist  LABS: Labs reviewed: Acceptable for surgery. (all labs ordered are listed, but only abnormal results are displayed)  Labs Reviewed - No data to display   IMAGES: CT Abdomen Pelvis 08/08/2019 IMPRESSION: Interval placement of left-sided nephrostomy catheter which appears inter lower pole collecting system with internal loop in the left renal pelvis. Proximal left ureteral calculus noted on prior exam is not visualized. No hydronephrosis is noted. Small nonobstructive calculus is seen in lower pole collecting system of left kidney.  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 Recommend followup by ultrasound in 3 years. This recommendation follows ACR consensus guidelines: White Paper of the ACR Incidental Findings Committee II on Vascular Findings. J Am Coll Radiol 2013; 10:789-794.  Iliac vein stents are noted again.   EKG: 09/02/2019 Rate 71 bpm Atrial fibrillation  Low voltage QRS Nonspecific T wave abnormality  CV: Echo 06/25/2019 IMPRESSIONS    1. Left ventricular ejection fraction, by visual estimation, is 60 to 65%. The left ventricle has normal function. Normal left ventricular size. There is mildly increased left ventricular hypertrophy.  2. Definity contrast agent was given IV to delineate the left ventricular endocardial borders.  3. Global right ventricle has severely reduced systolic function.The right ventricular size is mildly enlarged.  4. Left atrial size was normal.  5. Right atrial size was mildly dilated.  6. The mitral valve is normal in structure. Trace mitral valve regurgitation. No evidence of mitral stenosis.  7. The tricuspid valve is normal in structure. Tricuspid valve regurgitation is  trivial.  8. The aortic valve is tricuspid Aortic valve regurgitation was not visualized by color flow Doppler.  Structurally normal aortic valve, with no evidence of sclerosis or stenosis.  9. The pulmonic valve was not well visualized. Pulmonic valve regurgitation is not visualized by color flow Doppler. 10. Mildly elevated pulmonary artery systolic pressure. 11. The inferior vena cava is dilated in size with <50% respiratory variability, suggesting right atrial pressure of 15 mmHg. 12. Definity used; normal LV function; mild LVH; right heart not well visualized but right atrium and ventricle appear to be dilated with significant RV dysfunction. Past Medical History:  Diagnosis Date  . Arthritis   . CHF (congestive heart failure) ([place])   . Chronic diastolic heart failure (HCC)    Normal LV Fxn by Echo 04/2017   . Chronic venous insufficiency 2015   s/p Bilateral Iliac & Femoral Stents (including IVC) - DEEP Venous Reflux.  Also s/p Bilateral GSV Ablation.  [name] Dyspnea   . Dysrhythmia    a fib   . Essential hypertension   . History of kidney stones   . Hx of adenomatous colonic polyps   . Hyperlipidemia with target LDL less than 100   . Longstanding persistent atrial fibrillation ([place])   . Nephrolithiasis 05/2019  . Obesity   . OSA on CPAP    May need New CPAP machine & CPAP MD)  . Pneumonia   . Pre-diabetes   . Pulmonary hypertension ([place])   . Pyelonephritis 05/2019  . Venous insufficiency    LE    Past Surgical History:  Procedure Laterality Date  . ABDOMINAL VENOUS DUPLEX Bilateral 07/2014   ([place]iology: Dr. Cleda Mccreedy): (prior to Bilateral Iliac-Femoral Venous Stent Placement): Patent IVC.  No DVT B/L Iliac or Com fem V..  +++ DEEP V REFLUX B/L ILIAC & FEM V.  Absent Superficial V Reflux B/L x L SSV (too tortuous for intervention).  BL GSV occluded - prior ablation.   . ABDOMINAL VENOUS DUPLEX Bilateral 11/2014   Absent DVT.  Left common femoral venous reflux: 1508 MS left common femoral vein, 2284MS left proximal femoral vein -> cystic structure noted in the left popliteal vein (7.6 x  1.4 x 3.8 cm).;  Right leg deep venous reflux noted in the distal iliac, common femoral, proximal profunda femoral, proximal superficial femoral, superficial femoral and popliteal vein.  [name] CARDIAC CATHETERIZATION  10/2009   [facility] - Dr. 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  EF~45%.  Mild pulmonary pretension.  [name] CARDIAC PET  08/2009   Inferolateral partially reversible defect -- > false positive by cath  . COLONOSCOPY    . ESOPHAGOGASTRODUODENOSCOPY    . GREATER SAPHENOUS VEIN ABLATION Bilateral Before 2015   [facility] (Dr. Eugenie Filler)  . IR NEPHROSTOMY EXCHANGE LEFT  08/28/2019  . IR NEPHROSTOMY PLACEMENT LEFT  06/24/2019  . IR TRANSCATH PLC STENT  EA ADD VEIN  INC ANGIOPLASTY Left 10/2014   [facility], Dr. 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 IR TRANSCATH PLC STENT  INITIAL VEIN  INC ANGIOPLASTY Right 08/2014   [facility] - Dr. Stormy Fabian): US Guided --> R Common & External Iliac, Common Femoral Venography with Placement of 24 mmx 70  mm Stent - R Com Iliac-Ext Iliac- & Com Fem V. reducing 60-80% venous compression to ~0%).;; also noted significant L-sided venous compression (staged stenting).   [name] LIPOMA EXCISION     x 2 came back again  . NM MYOVIEW LTD  11/2005   Adenosine Myoview [facility] - Dr. Cleda Mccreedy): Normal myocardial perfusion scan. No ischemia or infarction.  . TRANSTHORACIC ECHOCARDIOGRAM  04/26/2017    [facility] - Dr. Cleda Mccreedy) - report not available.  Per clinic note: Normal global LV function. Mild concentric temperature. Mild TR. Mild pulmonary hypertension.;;   . TRANSTHORACIC ECHOCARDIOGRAM  7/'15; 7/'17   [facility] - Dr. Saverio Danker) a) low normal LV function  (EF 56%). Moderate concentric LVH. Mild LA dilation. Normal pulmonary pressures.  ;; b) normal global function - EF 60-65%. Moderate pulmonary hypertension. Biatrial enlargement. Moderate concentric LVH.  [name] TRANSTHORACIC ECHOCARDIOGRAM  02/2010   [facility] - Dr. Saverio Danker): Normal global LV function. Mild LVH. EF 60%.; November 2010. Moderate LV dilation. Moderate LVH. Normal EF 50-55%. Mild to moderate pulmonary hypertension. (July 2012 showed normal function with mild LVH, mild MR noted in August 2014)  . TRANSTHORACIC ECHOCARDIOGRAM  05/2019   During admission for UTI, sepsis with A. fib RVR: LV normal size and function.  EF 60-65%.  Mildly reduced RV size with severely reduced function.  Despite this normal atrial size noted with just only mild right atrial enlargement with elevated RAP (dilated IVC).  Relatively poor TR jet, but findings do suggest elevated RV pressures.    MEDICATIONS: No current facility-administered medications for this encounter.   [name] amLODipine (NORVASC) 10 MG tablet  . atorvastatin (LIPITOR) 10 MG tablet  . HYDROcodone-acetaminophen (NORCO/VICODIN) 5-325 MG tablet  . magnesium oxide (MAG-OX) 400 (241.3 Mg) MG tablet  . metoprolol tartrate (LOPRESSOR) 25 MG tablet  . XARELTO 20 MG TABS tablet  . furosemide (LASIX) 40 MG tablet   [facility] Pre-Surgical Testing [phone] 11/05/19  4:41 PM

## 2019-11-05 NOTE — Anesthesia Preprocedure Evaluation (Addendum)
Anesthesia Evaluation  Patient identified by MRN, date of birth, ID band Patient awake    Reviewed: Allergy & Precautions, NPO status , Patient's Chart, lab work & pertinent test results, reviewed documented beta blocker date and time   History of Anesthesia Complications Negative for: history of anesthetic complications  Airway Mallampati: II  TM Distance: >3 FB Neck ROM: Full    Dental  (+) Edentulous Upper, Edentulous Lower   Pulmonary shortness of breath and with exertion, sleep apnea and Continuous Positive Airway Pressure Ventilation , former smoker,  Covid + on 08/03/19   Pulmonary exam normal        Cardiovascular hypertension, Pt. on medications and Pt. on home beta blockers +CHF  Normal cardiovascular exam+ dysrhythmias (on Xarelto) Atrial Fibrillation   TTE 06/25/19: EF 60-65%, mild LVH, RV with severely reduced systolic function, RV mildly enlarged,mild RAE, mildly elevated PASP      Neuro/Psych negative neurological ROS  negative psych ROS   GI/Hepatic negative GI ROS, Neg liver ROS,   Endo/Other  Morbid obesity  Renal/GU Left ureteral stone  negative genitourinary   Musculoskeletal  (+) Arthritis ,   Abdominal   Peds  Hematology negative hematology ROS (+)   Anesthesia Other Findings Day of surgery medications reviewed with patient.  Reproductive/Obstetrics negative OB ROS                           Anesthesia Physical Anesthesia Plan  ASA: IV  Anesthesia Plan: General   Post-op Pain Management:    Induction: Intravenous  PONV Risk Score and Plan: 2 and Treatment may vary due to age or medical condition, Ondansetron, Dexamethasone and Midazolam  Airway Management Planned: Oral ETT  Additional Equipment: None  Intra-op Plan:   Post-operative Plan: Extubation in OR  Informed Consent: I have reviewed the patients History and Physical, chart, labs and discussed the  procedure including the risks, benefits and alternatives for the proposed anesthesia with the patient or authorized representative who has indicated his/her understanding and acceptance.     Dental advisory given  Plan Discussed with: CRNA  Anesthesia Plan Comments:        Anesthesia Quick Evaluation

## 2019-11-05 NOTE — Progress Notes (Addendum)
PCP -  Cardiologist - Dr. Ellyn Hack. LOV: 08/27/19. clearance  Chest x-ray - CT chest: 11/04/19. EPIC EKG - 09/02/19 EPIC Stress Test -  ECHO - 06/25/19 EPIC Cardiac Cath -  Pulmonologist: Dr. Vaughan Browner. LOV: 10/25/19 Sleep Study -  CPAP -   Fasting Blood Sugar -  Checks Blood Sugar _____ times a day  Blood Thinner Instructions:Stop Xarelto on 11/03/19. Aspirin Instructions: Last Dose:  Anesthesia review:   Patient denies shortness of breath, fever, cough and chest pain at PAT appointment   Patient verbalized understanding of instructions that were given to them at the PAT appointment. Patient was also instructed that they will need to review over the PAT instructions again at home before surgery.

## 2019-11-06 ENCOUNTER — Encounter (HOSPITAL_COMMUNITY)
Admission: RE | Disposition: A | Discharge status: Home / Self Care | Payer: Self-pay | Source: Home / Self Care | Attending: Urology

## 2019-11-06 ENCOUNTER — Other Ambulatory Visit: Payer: Self-pay

## 2019-11-06 ENCOUNTER — Ambulatory Visit (HOSPITAL_COMMUNITY)
Admission: RE | Admit: 2019-11-06 | Discharge: 2019-11-06 | Disposition: A | Discharge status: Home / Self Care | Payer: Medicare Other | Attending: Urology | Admitting: Urology

## 2019-11-06 ENCOUNTER — Ambulatory Visit (HOSPITAL_COMMUNITY): Payer: Medicare Other

## 2019-11-06 ENCOUNTER — Ambulatory Visit (HOSPITAL_COMMUNITY): Payer: Medicare Other | Admitting: Physician Assistant

## 2019-11-06 ENCOUNTER — Encounter (HOSPITAL_COMMUNITY): Payer: Self-pay | Admitting: Urology

## 2019-11-06 ENCOUNTER — Ambulatory Visit (HOSPITAL_COMMUNITY): Payer: Medicare Other | Admitting: Anesthesiology

## 2019-11-06 DIAGNOSIS — N202 Calculus of kidney with calculus of ureter: Secondary | ICD-10-CM | POA: Diagnosis present

## 2019-11-06 DIAGNOSIS — I083 Combined rheumatic disorders of mitral, aortic and tricuspid valves: Secondary | ICD-10-CM | POA: Insufficient documentation

## 2019-11-06 DIAGNOSIS — Z7901 Long term (current) use of anticoagulants: Secondary | ICD-10-CM | POA: Diagnosis not present

## 2019-11-06 DIAGNOSIS — I272 Pulmonary hypertension, unspecified: Secondary | ICD-10-CM | POA: Diagnosis not present

## 2019-11-06 DIAGNOSIS — I714 Abdominal aortic aneurysm, without rupture: Secondary | ICD-10-CM | POA: Diagnosis not present

## 2019-11-06 DIAGNOSIS — I110 Hypertensive heart disease with heart failure: Secondary | ICD-10-CM | POA: Diagnosis not present

## 2019-11-06 DIAGNOSIS — I4891 Unspecified atrial fibrillation: Secondary | ICD-10-CM | POA: Diagnosis not present

## 2019-11-06 DIAGNOSIS — N132 Hydronephrosis with renal and ureteral calculous obstruction: Secondary | ICD-10-CM | POA: Diagnosis not present

## 2019-11-06 DIAGNOSIS — M1990 Unspecified osteoarthritis, unspecified site: Secondary | ICD-10-CM | POA: Diagnosis not present

## 2019-11-06 DIAGNOSIS — Z87891 Personal history of nicotine dependence: Secondary | ICD-10-CM | POA: Diagnosis not present

## 2019-11-06 DIAGNOSIS — E785 Hyperlipidemia, unspecified: Secondary | ICD-10-CM | POA: Diagnosis not present

## 2019-11-06 DIAGNOSIS — Z8249 Family history of ischemic heart disease and other diseases of the circulatory system: Secondary | ICD-10-CM | POA: Insufficient documentation

## 2019-11-06 DIAGNOSIS — I739 Peripheral vascular disease, unspecified: Secondary | ICD-10-CM | POA: Diagnosis not present

## 2019-11-06 DIAGNOSIS — I872 Venous insufficiency (chronic) (peripheral): Secondary | ICD-10-CM | POA: Insufficient documentation

## 2019-11-06 DIAGNOSIS — N201 Calculus of ureter: Secondary | ICD-10-CM | POA: Diagnosis not present

## 2019-11-06 DIAGNOSIS — I2510 Atherosclerotic heart disease of native coronary artery without angina pectoris: Secondary | ICD-10-CM | POA: Insufficient documentation

## 2019-11-06 DIAGNOSIS — Z87442 Personal history of urinary calculi: Secondary | ICD-10-CM | POA: Insufficient documentation

## 2019-11-06 DIAGNOSIS — I5032 Chronic diastolic (congestive) heart failure: Secondary | ICD-10-CM | POA: Insufficient documentation

## 2019-11-06 DIAGNOSIS — G4733 Obstructive sleep apnea (adult) (pediatric): Secondary | ICD-10-CM | POA: Diagnosis not present

## 2019-11-06 DIAGNOSIS — Z20822 Contact with and (suspected) exposure to covid-19: Secondary | ICD-10-CM | POA: Insufficient documentation

## 2019-11-06 DIAGNOSIS — Z79899 Other long term (current) drug therapy: Secondary | ICD-10-CM | POA: Diagnosis not present

## 2019-11-06 DIAGNOSIS — I890 Lymphedema, not elsewhere classified: Secondary | ICD-10-CM | POA: Diagnosis not present

## 2019-11-06 DIAGNOSIS — R7303 Prediabetes: Secondary | ICD-10-CM | POA: Diagnosis not present

## 2019-11-06 HISTORY — PX: CYSTOSCOPY/URETEROSCOPY/HOLMIUM LASER/STENT PLACEMENT: SHX6546

## 2019-11-06 LAB — RESPIRATORY PANEL BY RT PCR (FLU A&B, COVID)
Influenza A by PCR: NEGATIVE
Influenza B by PCR: NEGATIVE
SARS Coronavirus 2 by RT PCR: NEGATIVE

## 2019-11-06 SURGERY — CYSTOSCOPY/URETEROSCOPY/HOLMIUM LASER/STENT PLACEMENT
Anesthesia: General | Site: Bladder | Laterality: Left

## 2019-11-06 MED ORDER — SUCCINYLCHOLINE CHLORIDE 200 MG/10ML IV SOSY
PREFILLED_SYRINGE | INTRAVENOUS | Status: AC
  Filled 2019-11-06: qty 10

## 2019-11-06 MED ORDER — PROPOFOL 10 MG/ML IV BOLUS
INTRAVENOUS | Status: DC | PRN
  Administered 2019-11-06: 170 mg via INTRAVENOUS

## 2019-11-06 MED ORDER — FENTANYL CITRATE (PF) 100 MCG/2ML IJ SOLN: 25.0000 ug | INTRAMUSCULAR | Status: DC | PRN

## 2019-11-06 MED ORDER — FENTANYL CITRATE (PF) 100 MCG/2ML IJ SOLN
INTRAMUSCULAR | Status: DC | PRN
  Administered 2019-11-06: 100 ug via INTRAVENOUS

## 2019-11-06 MED ORDER — CIPROFLOXACIN IN D5W 400 MG/200ML IV SOLN
400.0000 mg | Freq: Once | INTRAVENOUS | Status: AC
  Administered 2019-11-06: 09:00:00 400 mg via INTRAVENOUS
  Filled 2019-11-06: qty 200

## 2019-11-06 MED ORDER — FENTANYL CITRATE (PF) 100 MCG/2ML IJ SOLN
INTRAMUSCULAR | Status: AC
  Filled 2019-11-06: qty 2

## 2019-11-06 MED ORDER — PROPOFOL 10 MG/ML IV BOLUS
INTRAVENOUS | Status: AC
  Filled 2019-11-06: qty 20

## 2019-11-06 MED ORDER — LIDOCAINE HCL (CARDIAC) PF 100 MG/5ML IV SOSY
PREFILLED_SYRINGE | INTRAVENOUS | Status: DC | PRN
  Administered 2019-11-06: 100 mg via INTRAVENOUS

## 2019-11-06 MED ORDER — DEXAMETHASONE SODIUM PHOSPHATE 10 MG/ML IJ SOLN
INTRAMUSCULAR | Status: DC | PRN
  Administered 2019-11-06: 10 mg via INTRAVENOUS

## 2019-11-06 MED ORDER — SODIUM CHLORIDE 0.9 % IR SOLN
Status: DC | PRN
  Administered 2019-11-06: 1000 mL

## 2019-11-06 MED ORDER — IOHEXOL 300 MG/ML  SOLN
INTRAMUSCULAR | Status: DC | PRN
  Administered 2019-11-06: 15 mL via URETHRAL

## 2019-11-06 MED ORDER — CIPROFLOXACIN HCL 500 MG PO TABS: 500.0000 mg | ORAL_TABLET | Freq: Two times a day (BID) | ORAL | 0 refills | Status: AC

## 2019-11-06 MED ORDER — ONDANSETRON HCL 4 MG/2ML IJ SOLN
INTRAMUSCULAR | Status: DC | PRN
  Administered 2019-11-06: 4 mg via INTRAVENOUS

## 2019-11-06 MED ORDER — PHENYLEPHRINE 40 MCG/ML (10ML) SYRINGE FOR IV PUSH (FOR BLOOD PRESSURE SUPPORT)
PREFILLED_SYRINGE | INTRAVENOUS | Status: DC | PRN
  Administered 2019-11-06: 80 ug via INTRAVENOUS

## 2019-11-06 MED ORDER — LIDOCAINE 2% (20 MG/ML) 5 ML SYRINGE
INTRAMUSCULAR | Status: AC
  Filled 2019-11-06: qty 5

## 2019-11-06 MED ORDER — SUCCINYLCHOLINE CHLORIDE 20 MG/ML IJ SOLN
INTRAMUSCULAR | Status: DC | PRN
  Administered 2019-11-06: 200 mg via INTRAVENOUS

## 2019-11-06 MED ORDER — OXYCODONE HCL 5 MG PO TABS: 5.0000 mg | ORAL_TABLET | Freq: Once | ORAL | Status: DC | PRN

## 2019-11-06 MED ORDER — ACETAMINOPHEN 500 MG PO TABS
1000.0000 mg | ORAL_TABLET | Freq: Once | ORAL | Status: AC
  Administered 2019-11-06: 1000 mg via ORAL
  Filled 2019-11-06: qty 2

## 2019-11-06 MED ORDER — DEXAMETHASONE SODIUM PHOSPHATE 10 MG/ML IJ SOLN
INTRAMUSCULAR | Status: AC
  Filled 2019-11-06: qty 1

## 2019-11-06 MED ORDER — LACTATED RINGERS IV SOLN: INTRAVENOUS | Status: DC

## 2019-11-06 MED ORDER — OXYCODONE HCL 5 MG/5ML PO SOLN: 5.0000 mg | Freq: Once | ORAL | Status: DC | PRN

## 2019-11-06 MED ORDER — ONDANSETRON HCL 4 MG/2ML IJ SOLN
INTRAMUSCULAR | Status: AC
  Filled 2019-11-06: qty 2

## 2019-11-06 MED ORDER — SODIUM CHLORIDE 0.9 % IR SOLN
Status: DC | PRN
  Administered 2019-11-06: 3000 mL via INTRAVESICAL

## 2019-11-06 MED ORDER — PROMETHAZINE HCL 25 MG/ML IJ SOLN: 6.2500 mg | INTRAMUSCULAR | Status: DC | PRN

## 2019-11-06 SURGICAL SUPPLY — 19 items
BAG URO CATCHER STRL LF (MISCELLANEOUS) ×2 IMPLANT
BASKET ZERO TIP NITINOL 2.4FR (BASKET) IMPLANT
CATH URET 5FR 28IN OPEN ENDED (CATHETERS) ×2 IMPLANT
CATH URET DUAL LUMEN 6-10FR 50 (CATHETERS) ×2 IMPLANT
CLOTH BEACON ORANGE TIMEOUT ST (SAFETY) ×2 IMPLANT
EXTRACTOR STONE NITINOL NGAGE (UROLOGICAL SUPPLIES) ×2 IMPLANT
FIBER LASER FLEXIVA 365 (UROLOGICAL SUPPLIES) IMPLANT
FIBER LASER TRAC TIP (UROLOGICAL SUPPLIES) ×2 IMPLANT
GLOVE BIOGEL M STRL SZ7.5 (GLOVE) ×2 IMPLANT
GOWN STRL REUS W/TWL XL LVL3 (GOWN DISPOSABLE) ×2 IMPLANT
GUIDEWIRE STR DUAL SENSOR (WIRE) ×2 IMPLANT
GUIDEWIRE ZIPWRE .038 STRAIGHT (WIRE) ×2 IMPLANT
KIT TURNOVER KIT A (KITS) IMPLANT
MANIFOLD NEPTUNE II (INSTRUMENTS) ×2 IMPLANT
PACK CYSTO (CUSTOM PROCEDURE TRAY) ×2 IMPLANT
SHEATH URETERAL 12FRX35CM (MISCELLANEOUS) IMPLANT
STENT URET 6FRX26 CONTOUR (STENTS) ×2 IMPLANT
TUBING CONNECTING 10 (TUBING) ×2 IMPLANT
TUBING UROLOGY SET (TUBING) ×2 IMPLANT

## 2019-11-06 NOTE — H&P (Signed)
Office Visit Report     10/07/2019   --------------------------------------------------------------------------------   Patrick Bishop  MRN: [id number]  DOB: April 23, 1941, 79 year old Male  SSN:    PRIMARY CARE:    REFERRING:    PROVIDER:  Ellison Hughs, M.D.  TREATING:  Azucena Fallen, NP  LOCATION:  [facility] [phone]     --------------------------------------------------------------------------------   CC/HPI: Kidney stone   The patient is a 79 year old male with a history of kidney stones, CHF, A-fib (on lovenox), PVD, LE lymphedema, HTN, HLD, obesity and OSA.   07/11/2019: The patient was seen in consultation on 06/24/2019 after he was found to be in septic shock from a 1 cm left UPJ stone. He required emergent percutaneous nephrostomy tube placement at that time. He is here today for a routine checkup and to discuss definitive stone treatment. He reports mild left flank pain from his nephrostomy tube, but denies interval episodes of nausea, vomiting, fevers or chills. He is urinating w/o difficulty and feels like he is emptying his bladder well. He states that he has a prior history of a urethral stricture that required dilation in the past.   -Currently residing at the [facility] facility.   Cardiologist- Dr. Glenetta Hew  PCP- Dr. Lauretta Grill [name]     10/07/19: Patient with above noted history. Since being seen last he was hospitalized due to COVID illness and surgical procedure was delayed. He is scheduled for left URS on 1/20. Today he presents for preoperative appointment. He denies any current flank or abdominal pain. His nephrostomy tube remains in place; however, he states he has had very little output for the past week. It was last exchanged on 12/2. He currently denies any dysuria or exacerbation of voiding symptoms. No fevers or chills per his report. He is on Xarelto, with cardiac clearance to hold this 3 days prior to his procedure.      ALLERGIES: Aldactone TABS    MEDICATIONS: Metoprolol Tartrate 25 mg tablet  Amlodipine Besylate 10 mg tablet  Atorvastatin Calcium 10 mg tablet  Furosemide 40 mg tablet  Hydrocodone-Acetaminophen 5 mg-325 mg tablet  Magnesium  Xarelto 20 mg tablet     Notes: pt unaware of his medications    GU PSH: None     PSH Notes: Surgical History  06/24/2019 Ir nephrostomy placement left  04/26/2017 Transthoracic echocardiogram  11/2014 ABDOMINAL VENOUS DUPLEX [Other] (Bilateral)  10/2014 Ir transcath plc stent ea add vein inc angioplasty (Left)  08/2014 Ir transcath plc stent initial vein inc angioplasty (Right)  07/2014 ABDOMINAL VENOUS DUPLEX [Other] (Bilateral)  02/2010 Transthoracic echocardiogram  10/2009 Cardiac catheterization  08/2009 CARDIAC PET [Other]  11/2005 Nm myoview ltd  Date Unknown Colonoscopy  Date Unknown Esophagogastroduodenoscopy  Before 2015 GREATER SAPHENOUS VEIN ABLATION [Other] (Bilateral)      NON-GU PSH: Cardiac Stent Placement     GU PMH: Ureteral calculus - 07/11/2019      PMH Notes: Problem List  Cardiovascular and Mediastinum  Chronic diastolic HF (heart failure) (HCC)  Chronic venous insufficiency  Essential hypertension  Longstanding persistent atrial fibrillation: CHA2DSVasc = ~3 (age x 2, HTN)  Pulmonary hypertension ([place])  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 -    Atrial fibrillation (HCC)    Congestive heart failure (HCC)  Respiratory  OSA on CPAP  Digestive    Cholelithiasis  Nervous and Auditory    Hearing loss  Musculoskeletal and Integument    Arthritis  Genitourinary    Septic shock  due to urinary tract infection (HCC)    Hydronephrosis with obstructing calculus  Other  Hyperlipidemia with target LDL less than 100  Morbid obesity (HCC)    Chronic pain    Lymphedema    Muscle weakness    Unsteady gait    Elevated troponin    Heme + stool    Decreased hemoglobin      NON-GU PMH: None   FAMILY  HISTORY: Cancer - Mother Hypertension - Father, Brother   SOCIAL HISTORY: Marital Status: Widowed Preferred Language: English; Race: White Current Smoking Status: Patient does not smoke anymore.   Tobacco Use Assessment Completed: Used Tobacco in last 30 days? Has never drank.  Does not drink caffeine.    REVIEW OF SYSTEMS:    GU Review Male:   Patient denies frequent urination, hard to postpone urination, burning/ pain with urination, get up at night to urinate, leakage of urine, stream starts and stops, trouble starting your stream, have to strain to urinate , erection problems, and penile pain.  Gastrointestinal (Upper):   Patient denies nausea, vomiting, and indigestion/ heartburn.  Gastrointestinal (Lower):   Patient denies diarrhea and constipation.  Constitutional:   Patient denies fever, night sweats, weight loss, and fatigue.  Skin:   Patient denies skin rash/ lesion and itching.  Eyes:   Patient denies blurred vision and double vision.  Ears/ Nose/ Throat:   Patient denies sore throat and sinus problems.  Hematologic/Lymphatic:   Patient denies easy bruising and swollen glands.  Cardiovascular:   Patient denies leg swelling and chest pains.  Respiratory:   Patient denies cough and shortness of breath.  Endocrine:   Patient denies excessive thirst.  Musculoskeletal:   Patient denies back pain and joint pain.  Neurological:   Patient denies headaches and dizziness.  Psychologic:   Patient denies depression and anxiety.   Notes: November 3rd positive for covid. November 23rd negative for Covid    VITAL SIGNS:      10/07/2019 01:29 PM  Weight 322 lb / 146.06 kg  Height 73 in / 185.42 cm  BP 119/69 mmHg  Pulse 84 /min  Temperature 97.9 F / 36.6 C  BMI 42.5 kg/m   MULTI-SYSTEM PHYSICAL EXAMINATION:    Constitutional: Obese. No physical deformities. Normally developed. Good grooming.   Respiratory: No labored breathing, no use of accessory muscles.   Cardiovascular:  Regular rate and rhythm. No murmur, no gallop. Extremity swelling of bilateral lower extremities. Normal temperature, normal extremity pulses, no varicosities.   Neurologic / Psychiatric: Oriented to time, oriented to place, oriented to person. No depression, no anxiety, no agitation.  Gastrointestinal: Obese abdomen. No mass, no tenderness, no rigidity. Left nephrostomy tube in place, with less than 5 cc output noted in drainage bag.   Musculoskeletal: Bilateral LE lymphedema. Uses rolling walker.      PAST DATA REVIEWED:  Source Of History:  Patient  Records Review:   Previous Doctor Records, Previous Patient Records  Urine Test Review:   Urinalysis, Urine Culture  X-Ray Review: C.T. Abdomen/Pelvis: Reviewed Films. Reviewed Report.     PROCEDURES:          Urinalysis w/Scope Dipstick Dipstick Cont'd Micro  Color: Yellow Bilirubin: Neg mg/dL WBC/hpf: >60/hpf  Appearance: Cloudy Ketones: Neg mg/dL RBC/hpf: 20 - 40/hpf  Specific Gravity: 1.025 Blood: 2+ ery/uL Bacteria: Many (>50/hpf)  pH: 5.5 Protein: 1+ mg/dL Cystals: NS (Not Seen)  Glucose: Neg mg/dL Urobilinogen: 0.2 mg/dL Casts: Hyaline    Nitrites: Positive Trichomonas: Not Present  Leukocyte Esterase: 3+ leu/uL Mucous: Not Present      Epithelial Cells: 0 - 5/hpf      Yeast: NS (Not Seen)      Sperm: Not Present    ASSESSMENT:      ICD-10 Details  1 GU:   Ureteral calculus - N20.1   2 NON-GU:   Bacteriuria - R82.71   3   Pyuria/other UA findings - R82.79    PLAN:   The risks, benefits and alternatives of cystoscopy with LEFT ureteroscopy, laser lithotripsy and ureteral stent placement was discussed the patient.  Risks included, but are not limited to: bleeding, urinary tract infection, ureteral injury/avulsion, ureteral stricture formation, retained stone fragments, the possibility that multiple surgeries may be required to treat the stone(s), MI, stroke, PE and the inherent risks of general anesthesia.  The patient voices  understanding and wishes to proceed.            Medications New Meds: Keflex 500 mg capsule 1 capsule PO BID   #14  0 Refill(s)            Orders Labs Urine Culture          Document Letter(s):  Created for Patient: Clinical Summary

## 2019-11-06 NOTE — Anesthesia Procedure Notes (Signed)
Procedure Name: Intubation Date/Time: 11/06/2019 9:40 AM Performed by: [place] Kitana Gage C, CRNA Pre-anesthesia Checklist: Patient identified, Emergency Drugs available, Suction available and Patient being monitored Patient Re-evaluated:Patient Re-evaluated prior to induction Oxygen Delivery Method: Circle system utilized Preoxygenation: Pre-oxygenation with 100% oxygen Induction Type: IV induction Ventilation: Mask ventilation without difficulty Laryngoscope Size: Mac and 4 Grade View: Grade I Tube type: Oral Tube size: 7.5 mm Number of attempts: 1 Airway Equipment and Method: Stylet and Oral airway Placement Confirmation: ETT inserted through vocal cords under direct vision,  positive ETCO2 and breath sounds checked- equal and bilateral Secured at: 22 cm Tube secured with: Tape Dental Injury: Teeth and Oropharynx as per pre-operative assessment

## 2019-11-06 NOTE — Op Note (Signed)
Operative Note  Preoperative diagnosis:  1. Obstructing 1 cm left UPJ calculus  Postoperative diagnosis: 1. Obstructing 1 cm left UPJ calculus  Procedure(s): 1. Cystoscopy with left ureteroscopy, holmium laser lithotripsy and left JJ stent placement 2. Left retrograde pyelogram with intraoperative interpretation of fluoroscopic imaging  Surgeon: Ellison Hughs, MD  Assistants:  None  Anesthesia:  General  Complications:  None  EBL: Less than 5 mL  Specimens: 1. Left ureteral stone 2. Left nephrostomy tube was removed intact, inspected and discarded  Drains/Catheters: 1. Left 6 French, 26 cm JJ stent without tether  Intraoperative findings:   Left retrograde pyelogram revealed a filling defect in the proximal aspect of the left ureter, consistent with his obstructing stone that was seen on cross-sectional imaging. The proximal aspects of the left ureter was mildly dilated along with a mildly dilated left renal pelvis. The distal aspect of the left ureter showed no dilation or filling defects. Left nephrostomy tube in place.  Indication:  Patrick Bishop is a 79 y.o. male initially seen in consultation on 06/24/2019 for an obstructing 1 cm left UPJ calculus. He required emergent percutaneous nephrostomy tube placement at that time. He is here today for definitive stone treatment. He has been consented for the above procedures, voices understanding and wishes to proceed.  Description of procedure:  After informed consent was obtained, the patient was brought to the operating room and general LMA anesthesia was administered. The patient was then placed in the dorsolithotomy position and prepped and draped in the usual sterile fashion. A timeout was performed. A 23 French rigid cystoscope was then inserted into the urethral meatus and advanced into the bladder under direct vision. A complete bladder survey revealed no intravesical pathology.  A 5 French ureteral catheter was then  inserted into the left ureteral orifice and a retrograde pyelogram was obtained, with the findings listed above.  A Glidewire was then used to intubate the lumen of the ureteral catheter and was advanced up to the left renal pelvis, under fluoroscopic guidance.  The catheter was then removed, leaving the wire in place. An additional sensor wire was then placed via a dual-lumen catheter, which was then advanced up to the proximal aspects of the left ureter, under fluoroscopic guidance.  The dual-lumen catheter was then removed, leaving both wires in place. A 35 cm, 14 French ureteral access sheath was then advanced over the sensor wire and into position within the proximal aspects of the left ureter, confirming placement via fluoroscopy.   The flexible ureteroscope was then advanced through the lumen of the access sheath and into the left ureter where his obstructing stone was identified. A 200 m holmium laser was then used to fracture the stone into several smaller pieces, which were then extracted using an engage basket. The flexible ureteroscope was then advanced up to the left renal pelvis where his nephrostomy tube was identified and appeared in good position. There were no other stones within the left renal pelvis or its associated calyces. The ureteral access sheath was then removed under direct vision, revealing no additional stone burden along the remainder of the left ureter.  The rigid cystoscope was then advanced over the wire and into the bladder. A 6 French, 26 cm JJ stent was then placed over the wire and into good position within the left collecting system, confirming placement via fluoroscopy. His left nephrostomy tube was then removed, under fluoroscopic guidance. The patient's bladder was drained. He tolerated the procedure well and was transferred  to the postanesthesia in stable condition.  Plan: Follow-up on 11/13/2019 for cystoscopy and stent removal in the office

## 2019-11-06 NOTE — Transfer of Care (Signed)
Immediate Anesthesia Transfer of Care Note  Patient: Patrick Bishop  Procedure(s) Performed: CYSTOSCOPY/RETROGRADE/URETEROSCOPY/HOLMIUM LASER/STENT PLACEMENT. REMOVAL NEPHROSCOPYTUBE (Left Bladder)  Patient Location: PACU  Anesthesia Type:General  Level of Consciousness: awake, alert  and oriented  Airway & Oxygen Therapy: Patient Spontanous Breathing and Patient connected to face mask oxygen  Post-op Assessment: Report given to RN and Post -op Vital signs reviewed and stable  Post vital signs: Reviewed and stable  Last Vitals:  Vitals Value Taken Time  BP    Temp    Pulse    Resp    SpO2      Last Pain:  Vitals:   11/06/19 0753  TempSrc: Oral         Complications: No apparent anesthesia complications

## 2019-11-06 NOTE — Anesthesia Postprocedure Evaluation (Signed)
Anesthesia Post Note  Patient: Rett Stehlik  Procedure(s) Performed: CYSTOSCOPY/RETROGRADE/URETEROSCOPY/HOLMIUM LASER/STENT PLACEMENT. REMOVAL NEPHROSCOPYTUBE (Left Bladder)     Patient location during evaluation: PACU Anesthesia Type: General Level of consciousness: awake and alert and oriented Pain management: pain level controlled Vital Signs Assessment: post-procedure vital signs reviewed and stable Respiratory status: spontaneous breathing, nonlabored ventilation and respiratory function stable Cardiovascular status: blood pressure returned to baseline Postop Assessment: no apparent nausea or vomiting Anesthetic complications: no    Last Vitals:  Vitals:   11/06/19 1045 11/06/19 1100  BP: 109/64 109/73  Pulse: (!) 54 61  Resp: 19 18  Temp:    SpO2: 97% 96%    Last Pain:  Vitals:   11/06/19 1031  TempSrc:   PainSc: 0-No pain                 Brennan Bailey

## 2019-11-07 LAB — HEMOGLOBIN A1C
Hgb A1c MFr Bld: 5.5 % (ref 4.8–5.6)
Mean Plasma Glucose: 111 mg/dL

## 2019-11-08 DIAGNOSIS — I5033 Acute on chronic diastolic (congestive) heart failure: Secondary | ICD-10-CM | POA: Diagnosis not present

## 2019-11-08 DIAGNOSIS — I872 Venous insufficiency (chronic) (peripheral): Secondary | ICD-10-CM | POA: Diagnosis not present

## 2019-11-08 DIAGNOSIS — Z436 Encounter for attention to other artificial openings of urinary tract: Secondary | ICD-10-CM | POA: Diagnosis not present

## 2019-11-08 DIAGNOSIS — I110 Hypertensive heart disease with heart failure: Secondary | ICD-10-CM | POA: Diagnosis not present

## 2019-11-08 DIAGNOSIS — I4811 Longstanding persistent atrial fibrillation: Secondary | ICD-10-CM | POA: Diagnosis not present

## 2019-11-08 DIAGNOSIS — I272 Pulmonary hypertension, unspecified: Secondary | ICD-10-CM | POA: Diagnosis not present

## 2019-11-13 DIAGNOSIS — N201 Calculus of ureter: Secondary | ICD-10-CM | POA: Diagnosis not present

## 2019-11-13 DIAGNOSIS — R8279 Other abnormal findings on microbiological examination of urine: Secondary | ICD-10-CM | POA: Diagnosis not present

## 2019-11-14 DIAGNOSIS — I872 Venous insufficiency (chronic) (peripheral): Secondary | ICD-10-CM | POA: Diagnosis not present

## 2019-11-14 DIAGNOSIS — I272 Pulmonary hypertension, unspecified: Secondary | ICD-10-CM | POA: Diagnosis not present

## 2019-11-14 DIAGNOSIS — I5033 Acute on chronic diastolic (congestive) heart failure: Secondary | ICD-10-CM | POA: Diagnosis not present

## 2019-11-14 DIAGNOSIS — Z436 Encounter for attention to other artificial openings of urinary tract: Secondary | ICD-10-CM | POA: Diagnosis not present

## 2019-11-14 DIAGNOSIS — I110 Hypertensive heart disease with heart failure: Secondary | ICD-10-CM | POA: Diagnosis not present

## 2019-11-14 DIAGNOSIS — I4811 Longstanding persistent atrial fibrillation: Secondary | ICD-10-CM | POA: Diagnosis not present

## 2019-11-21 DIAGNOSIS — I872 Venous insufficiency (chronic) (peripheral): Secondary | ICD-10-CM | POA: Diagnosis not present

## 2019-11-21 DIAGNOSIS — I5033 Acute on chronic diastolic (congestive) heart failure: Secondary | ICD-10-CM | POA: Diagnosis not present

## 2019-11-21 DIAGNOSIS — R8271 Bacteriuria: Secondary | ICD-10-CM | POA: Diagnosis not present

## 2019-11-21 DIAGNOSIS — I272 Pulmonary hypertension, unspecified: Secondary | ICD-10-CM | POA: Diagnosis not present

## 2019-11-21 DIAGNOSIS — I110 Hypertensive heart disease with heart failure: Secondary | ICD-10-CM | POA: Diagnosis not present

## 2019-11-21 DIAGNOSIS — Z436 Encounter for attention to other artificial openings of urinary tract: Secondary | ICD-10-CM | POA: Diagnosis not present

## 2019-11-21 DIAGNOSIS — I4811 Longstanding persistent atrial fibrillation: Secondary | ICD-10-CM | POA: Diagnosis not present

## 2019-11-22 ENCOUNTER — Ambulatory Visit (INDEPENDENT_AMBULATORY_CARE_PROVIDER_SITE_OTHER): Payer: Medicare Other | Admitting: Pulmonary Disease

## 2019-11-22 ENCOUNTER — Other Ambulatory Visit: Payer: Self-pay

## 2019-11-22 ENCOUNTER — Encounter: Payer: Self-pay | Admitting: Pulmonary Disease

## 2019-11-22 VITALS — BP 116/70 | HR 78 | Temp 97.0°F | Ht 73.0 in | Wt 353.0 lb

## 2019-11-22 DIAGNOSIS — R0600 Dyspnea, unspecified: Secondary | ICD-10-CM

## 2019-11-22 NOTE — Patient Instructions (Signed)
Glad you are doing well with regard to your breathing Will await PFTs Follow-up in 3 months. Please schedule call sooner if there is any change in your symptoms

## 2019-11-22 NOTE — Progress Notes (Signed)
Patrick Bishop    [id number]    December 05, 1940  Primary Care Physician:[name], [name], MD  Referring Physician: Lujean Amel, [address],  [place] [address]  Chief complaint: Follow-up for post COVID-19 pneumonia, clearance for urologic procedure  HPI: 79 year old with history of chronic diastolic CHF, atrial fibrillation, left UPJ stone with hydronephrosis status post left nephrostomy tube, secondary pulmonary hypertension, chronic lymphedema  Admitted from 11/6 to 08/09/2019 with pneumonia due to COVID-19 treated with remdesivir, steroids. Urology was consulted for for left nephrostomy tube, UJP stone with hydronephrosis.  He is being evaluated for a urologic procedure for removal of UPJ stone and is sent here for preop evaluation and clearance States that his breathing is improved since discharge from hospital but continues to have occasional dyspnea on exertion.  No cough, sputum production, fevers, chills.  Does not need supplemental oxygen.   Pets: No pets Occupation: Worked as an Runner, broadcasting/film/video and at [employer] Exposures: No known exposures.  No mold, hot tub, Jacuzzi.  No down pillows or comforter Smoking history: 35-pack-year smoker.  Quit in 1996 Travel history: No significant travel Relevant family history: No significant family history of lung disease  Interim history: Underwent cystoscopy and ureteroscopy on 2/10 without issue or respiratory exacerbation States that his breathing is slightly improved compared to prior visit No new complaints today  Outpatient Encounter Medications as of 11/22/2019  Medication Sig  . amLODipine (NORVASC) 10 MG tablet Take 10 mg by mouth daily with supper.   [name] atorvastatin (LIPITOR) 10 MG tablet Take 1 tablet (10 mg total) by mouth daily. (Patient taking differently: Take 10 mg by mouth daily at 6 PM. )  . furosemide (LASIX) 40 MG tablet Take  20 mg  ( 1/2 tablet)  twice a day except on Monday and Thursday take 40 mg twice a day. (Patient taking differently: Take 20-40 mg by mouth See admin instructions. Take 0.5 tablet (20 mg) by mouth twice daily on Sundays, Tuesdays, Wednesdays, Fridays, & Saturdays. Take 1 tablet (40 mg) by mouth twice daily on Mondays & Thursdays.)  . HYDROcodone-acetaminophen (NORCO/VICODIN) 5-325 MG tablet Take 1 tablet by mouth 2 (two) times daily as needed for moderate pain.  . magnesium oxide (MAG-OX) 400 (241.3 Mg) MG tablet Take 1 tablet (400 mg total) by mouth daily.  . metoprolol tartrate (LOPRESSOR) 25 MG tablet Take 1 tablet (25 mg total) by mouth See admin instructions. Take 25 mg by mouth in the morning and 37.5 mg in the evening (Patient taking differently: Take 25 mg by mouth See admin instructions. Take 1 tablet (25 mg) by mouth in the morning & take 1.5 tablets (37.5 mg) by mouth in the evening.)  . XARELTO 20 MG TABS tablet TAKE 1 TABLET DAILY WITH SUPPER (Patient taking differently: Take 20 mg by mouth daily with supper. TAKE 1 TABLET DAILY WITH SUPPER)   No facility-administered encounter medications on file as of 11/22/2019.   Physical Exam: Blood pressure 116/70, pulse 78, temperature (!) 97 F (36.1 C), temperature source Temporal, height 6\' 1"  (1.854 m), weight (!) 353 lb (160.1 kg), SpO2 97 %. Gen:      No acute distress, obese HEENT:  EOMI, sclera anicteric Neck:     No masses; no thyromegaly Lungs:    Clear to auscultation bilaterally; normal respiratory effort CV:         Regular rate and rhythm; no murmurs Abd:      +  bowel sounds; soft, non-tender; no palpable masses, no distension Ext:    1+ edema with chronic stasis dermatitis in the lower extremities. Skin:      Warm and dry; no rash Neuro: alert and oriented x 3 Psych: normal mood and affect  Data Reviewed: Imaging: Chest x-ray 08/02/2019-cardiomegaly, right basilar scarring or atelectasis.  CT abdomen pelvis 08/08/2019-no abnormality in the  visualized lung base.  Left nephrostomy catheter.  No hydronephrosis.  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    CT high-resolution 11/04/2019-reticulation and groundglass opacities in the lower lobe.  Chronic right diaphragm elevation  mMRC 11/22/2019-3  Cardiac: Echocardiogram 06/25/2019-LVEF 60-65%, enlarged RV with severely reduced RV systolic function RVSP 35  Assessment:  Post COVID-19 pneumonia CT scan reviewed with minimal reticulation and groundglass opacities. Overall he appears to be improving.  Continue monitoring PFTs are pending  Pulmonary hypertension Secondary to atrial fibrillation, diastolic heart failure, OSA Follows with cardiology  OSA Stable on CPAP.  He has an outside sleep doctor but cannot recall the name  Plan/Recommendations: -Follow-up PFTs Return to clinic in 3 months.  Marshell Garfinkel MD [facility] 11/22/2019, 2:11 PM  CC: Lujean Amel, MD

## 2019-12-02 DIAGNOSIS — I110 Hypertensive heart disease with heart failure: Secondary | ICD-10-CM | POA: Diagnosis not present

## 2019-12-09 DIAGNOSIS — H5202 Hypermetropia, left eye: Secondary | ICD-10-CM | POA: Diagnosis not present

## 2019-12-09 DIAGNOSIS — H2511 Age-related nuclear cataract, right eye: Secondary | ICD-10-CM | POA: Diagnosis not present

## 2019-12-10 ENCOUNTER — Telehealth: Payer: Self-pay

## 2019-12-10 NOTE — Telephone Encounter (Signed)
Patient with diagnosis of ATRIAL FIBRILLATION on [place] for anticoagulation.    Procedure: CATARACT SURGERY Date of procedure: 01/29/2020  CHADS2-VASc score of  4 (CHF, HTN, AGE X 2)  CrCl > 50MG /MIN   Per office protocol, NO NEED TO HOLD ANTICOAGULATION FOR CATARACT SURGERY.  If  absolutely needed may HOLD for 24 hours prior to procedure*

## 2019-12-10 NOTE — Telephone Encounter (Signed)
   [facility] Pre-operative Risk Assessment    Request for surgical clearance:  1. What type of surgery is being performed? Cataract Surgery   2. When is this surgery scheduled? 01/29/20   3. What type of clearance is required (medical clearance vs. Pharmacy clearance to hold med vs. Both)? Both  4. Are there any medications that need to be held prior to surgery and how long? Xarelto 7 days  5. Practice name and name of physician performing surgery? [facility]   6. What is your office phone number [phone]    7.   What is your office fax number [phone]  8.   Anesthesia type (None, local, MAC, general) ? Local with moderate sedation   Meryl Crutch 12/10/2019, 2:32 PM  _________________________________________________________________   (provider comments below)

## 2019-12-14 ENCOUNTER — Other Ambulatory Visit (HOSPITAL_COMMUNITY)
Admission: RE | Admit: 2019-12-14 | Discharge: 2019-12-14 | Disposition: A | Discharge status: Home / Self Care | Payer: Medicare Other | Source: Ambulatory Visit | Attending: Pulmonary Disease | Admitting: Pulmonary Disease

## 2019-12-14 DIAGNOSIS — Z20822 Contact with and (suspected) exposure to covid-19: Secondary | ICD-10-CM | POA: Diagnosis not present

## 2019-12-14 DIAGNOSIS — Z01812 Encounter for preprocedural laboratory examination: Secondary | ICD-10-CM | POA: Insufficient documentation

## 2019-12-14 LAB — SARS CORONAVIRUS 2 (TAT 6-24 HRS): SARS Coronavirus 2: NEGATIVE

## 2019-12-18 ENCOUNTER — Ambulatory Visit (INDEPENDENT_AMBULATORY_CARE_PROVIDER_SITE_OTHER): Payer: Medicare Other | Admitting: Pulmonary Disease

## 2019-12-18 ENCOUNTER — Other Ambulatory Visit: Payer: Self-pay

## 2019-12-18 DIAGNOSIS — J1282 Pneumonia due to coronavirus disease 2019: Secondary | ICD-10-CM

## 2019-12-18 DIAGNOSIS — R0600 Dyspnea, unspecified: Secondary | ICD-10-CM

## 2019-12-18 DIAGNOSIS — U071 COVID-19: Secondary | ICD-10-CM

## 2019-12-18 LAB — PULMONARY FUNCTION TEST
DL/VA % pred: 96 %
DL/VA: 3.73 ml/min/mmHg/L
DLCO cor % pred: 82 %
DLCO cor: 22.11 ml/min/mmHg
DLCO unc % pred: 82 %
DLCO unc: 22.11 ml/min/mmHg
FEF 25-75 Post: 2.44 L/sec
FEF 25-75 Pre: 2.04 L/sec
FEF2575-%Change-Post: 19 %
FEF2575-%Pred-Post: 105 %
FEF2575-%Pred-Pre: 88 %
FEV1-%Change-Post: 3 %
FEV1-%Pred-Post: 82 %
FEV1-%Pred-Pre: 79 %
FEV1-Post: 2.71 L
FEV1-Pre: 2.61 L
FEV1FVC-%Change-Post: -1 %
FEV1FVC-%Pred-Pre: 105 %
FEV6-%Change-Post: 3 %
FEV6-%Pred-Post: 82 %
FEV6-%Pred-Pre: 80 %
FEV6-Post: 3.57 L
FEV6-Pre: 3.45 L
FEV6FVC-%Change-Post: -1 %
FEV6FVC-%Pred-Post: 104 %
FEV6FVC-%Pred-Pre: 106 %
FVC-%Change-Post: 5 %
FVC-%Pred-Post: 79 %
FVC-%Pred-Pre: 75 %
FVC-Post: 3.64 L
FVC-Pre: 3.45 L
Post FEV1/FVC ratio: 74 %
Post FEV6/FVC ratio: 98 %
Pre FEV1/FVC ratio: 76 %
Pre FEV6/FVC Ratio: 100 %

## 2019-12-18 NOTE — Progress Notes (Signed)
Full PFT performed today. Due to weight and edema in legs patient unable to do PLETH.

## 2019-12-23 DIAGNOSIS — N390 Urinary tract infection, site not specified: Secondary | ICD-10-CM | POA: Diagnosis not present

## 2019-12-23 DIAGNOSIS — N201 Calculus of ureter: Secondary | ICD-10-CM | POA: Diagnosis not present

## 2019-12-23 DIAGNOSIS — B965 Pseudomonas (aeruginosa) (mallei) (pseudomallei) as the cause of diseases classified elsewhere: Secondary | ICD-10-CM | POA: Diagnosis not present

## 2020-01-06 ENCOUNTER — Telehealth: Payer: Self-pay | Admitting: Pulmonary Disease

## 2020-01-06 NOTE — Telephone Encounter (Signed)
Dr. Vaughan Browner please review PFT results.

## 2020-01-06 NOTE — Telephone Encounter (Signed)
PFTs show mild reduction in lung function which is consistent with his prior Covid infection We will monitor this closely going forward.

## 2020-01-07 NOTE — Telephone Encounter (Signed)
Called and spoke with pt letting him know the results of PFT per Dr. Vaughan Browner and pt verbalized understanding. While speaking with pt, I went ahead and scheduled a 3 month follow up from pt's last visit. Nothing further needed.

## 2020-01-08 ENCOUNTER — Telehealth: Payer: Self-pay | Admitting: Cardiology

## 2020-01-08 DIAGNOSIS — I4811 Longstanding persistent atrial fibrillation: Secondary | ICD-10-CM | POA: Diagnosis not present

## 2020-01-08 DIAGNOSIS — G4733 Obstructive sleep apnea (adult) (pediatric): Secondary | ICD-10-CM | POA: Diagnosis not present

## 2020-01-08 DIAGNOSIS — I272 Pulmonary hypertension, unspecified: Secondary | ICD-10-CM | POA: Diagnosis not present

## 2020-01-08 DIAGNOSIS — I5032 Chronic diastolic (congestive) heart failure: Secondary | ICD-10-CM | POA: Diagnosis not present

## 2020-01-08 NOTE — Telephone Encounter (Signed)
Need to know what dose of Lasix he is actually taking.   If is not responding to 20 mg of Lasix, would increase to 40 mg twice daily for 2 days. If he responds then stay at 40 mg daily.  If he is already taking 40 mg, would increase to 80 mg in the a.m. and 40 mg p.m. for 2 days. If responds go back to 40 mg twice daily for 1 week.  After increased dose of Lasix, will need to recheck chemistry panel (BMP) in roughly 1 week.  Should probably get him into either see myself or APP after that week.  Glenetta Hew, MD

## 2020-01-08 NOTE — Telephone Encounter (Signed)
Spoke with pt who report since discharged from hospital he was able to keep fluid down for about a month but noticed slow increase in feet/leg swelling. He report he has some SOB but it's his norm since COVID. Pt voiced he doesn't feel current lasix dose is working anymore.  Will route to MD for recommendations.

## 2020-01-08 NOTE — Telephone Encounter (Signed)
New Message  Pt called and stated that he is having a hard time with water retention. Offered to see Coletta Memos in the morning but cannot make it.   Please call back to discuss

## 2020-01-09 NOTE — Telephone Encounter (Signed)
Pt updated and verbalized understanding. F/u appointment scheduled for 4/21 at 3:15 pm with [name], DNP.

## 2020-01-09 NOTE — Telephone Encounter (Signed)
So have him take 80 mg this AM & 40 mg in PM.  & continue with this x 2-3 d to see if swelling/weight imroves.  Glenetta Hew, MD

## 2020-01-09 NOTE — Telephone Encounter (Signed)
Patrick Bishop is returning Alisha's call.

## 2020-01-09 NOTE — Telephone Encounter (Signed)
Left message to call back  

## 2020-01-09 NOTE — Telephone Encounter (Signed)
Pt confirmed he is currently take Lasix 20 mg BID then 40 mg BID on  Monday and Thursday.

## 2020-01-10 ENCOUNTER — Telehealth: Payer: Self-pay | Admitting: Cardiology

## 2020-01-10 NOTE — Telephone Encounter (Signed)
Patient's daughter, [name], is requesting to accompany the patient during his appointment scheduled for 01/15/20 at 3:15 PM with [name] due to patient having issues with mobility. Please advise.

## 2020-01-10 NOTE — Telephone Encounter (Signed)
I spoke with patient who reports he uses a walker and needs assistance.  I told him I would make a note that his daughter would attend upcoming appointment with him.

## 2020-01-13 ENCOUNTER — Other Ambulatory Visit (HOSPITAL_BASED_OUTPATIENT_CLINIC_OR_DEPARTMENT_OTHER): Payer: Self-pay

## 2020-01-13 DIAGNOSIS — R0683 Snoring: Secondary | ICD-10-CM

## 2020-01-13 DIAGNOSIS — G4730 Sleep apnea, unspecified: Secondary | ICD-10-CM

## 2020-01-13 DIAGNOSIS — G4710 Hypersomnia, unspecified: Secondary | ICD-10-CM

## 2020-01-14 ENCOUNTER — Other Ambulatory Visit: Payer: Self-pay | Admitting: Cardiology

## 2020-01-15 ENCOUNTER — Other Ambulatory Visit: Payer: Self-pay | Admitting: Adult Health

## 2020-01-15 ENCOUNTER — Ambulatory Visit (INDEPENDENT_AMBULATORY_CARE_PROVIDER_SITE_OTHER): Payer: Medicare Other | Admitting: Adult Health

## 2020-01-15 ENCOUNTER — Other Ambulatory Visit: Payer: Self-pay

## 2020-01-15 ENCOUNTER — Encounter: Payer: Self-pay | Admitting: Adult Health

## 2020-01-15 VITALS — BP 129/60 | HR 75 | Ht 73.0 in | Wt 366.8 lb

## 2020-01-15 DIAGNOSIS — I480 Paroxysmal atrial fibrillation: Secondary | ICD-10-CM | POA: Diagnosis not present

## 2020-01-15 DIAGNOSIS — Z79899 Other long term (current) drug therapy: Secondary | ICD-10-CM

## 2020-01-15 DIAGNOSIS — I5032 Chronic diastolic (congestive) heart failure: Secondary | ICD-10-CM | POA: Diagnosis not present

## 2020-01-15 DIAGNOSIS — I872 Venous insufficiency (chronic) (peripheral): Secondary | ICD-10-CM | POA: Diagnosis not present

## 2020-01-15 MED ORDER — POTASSIUM CHLORIDE CRYS ER 20 MEQ PO TBCR: 20.0000 meq | EXTENDED_RELEASE_TABLET | Freq: Every day | ORAL | 3 refills | Status: DC

## 2020-01-15 MED ORDER — METOLAZONE 5 MG PO TABS: 5.0000 mg | ORAL_TABLET | Freq: Every day | ORAL | 0 refills | Status: DC

## 2020-01-15 MED ORDER — TORSEMIDE 20 MG PO TABS: 40.0000 mg | ORAL_TABLET | Freq: Two times a day (BID) | ORAL | 3 refills | Status: DC

## 2020-01-15 NOTE — Progress Notes (Signed)
Cardiology Office Note   Date:  01/15/2020   ID:  Patrick Bishop, DOB 17-Jun-1941, MRN [id number]  PCP:  Lujean Amel, MD  Cardiologist:  Dr. Ellyn Hack  CC: CHF   History of Present Illness: Patrick Bishop is a 79 y.o. male who presents for ongoing assessment and management of chronic, persistent atrial fib on Xarelto, HFpEF, pulmonary HTN, chronic bilateral edema, with other hx to include OSA/OHS.  When last seen by Dr. Ellyn Hack via virtual visit on 08/28/2019 he was found to be frail and "worn out."    He had been hospitalized twice prior to being seen and had also contracted COVID during his stay at [facility] for deconditioning. At that time no medications were changed. He was advised to use lasix at increased dose (40 mg BID) for one week, and go back to previous dose of 40 mg BID two days a week. He was to continue CPAP.   He called our office on 01/09/2020 with complaints of LEE that has worsened with no response to extra doses of lasix. He was advised to increase lasix to 80 mg in the am and 40 mg in the pm for 2 days and then go back to 40 mg BID for one week. He will need labs this office visit. He is here for assessment of his response to medication changes.  This is a focused visit for this complaint.  He comes today with his daughter.  He has massive lower extremity edema.  His weight has gone up significantly from 353 pounds to 366 pounds since November 22, 2019 documented weight.  His daughter states that he is not restricted to salt intake.  Eats a lot of snack foods potato chips, crackers, high salt foods.  He has noticed more and more fluid retention.  He has PND and some orthopnea.  He is very sedentary using a rolling walker for ambulation.  He lives at a skilled nursing facility assisted living.  He eats in the dining room a lot and is not on a salt restricted diet.  Past Medical History:  Diagnosis Date  . Arthritis   . CHF (congestive heart failure) ([place])   . Chronic  diastolic heart failure (HCC)    Normal LV Fxn by Echo 04/2017   . Chronic venous insufficiency 2015   s/p Bilateral Iliac & Femoral Stents (including IVC) - DEEP Venous Reflux.  Also s/p Bilateral GSV Ablation.  [name] Dyspnea   . Dysrhythmia    a fib   . Essential hypertension   . History of kidney stones   . Hx of adenomatous colonic polyps   . Hyperlipidemia with target LDL less than 100   . Longstanding persistent atrial fibrillation ([place])   . Nephrolithiasis 05/2019  . Obesity   . OSA on CPAP    May need New CPAP machine & CPAP MD)  . Pneumonia   . Pre-diabetes   . Pulmonary hypertension ([place])   . Pyelonephritis 05/2019  . Venous insufficiency    LE    Past Surgical History:  Procedure Laterality Date  . ABDOMINAL VENOUS DUPLEX Bilateral 07/2014   ([place]iology: Dr. Cleda Mccreedy): (prior to Bilateral Iliac-Femoral Venous Stent Placement): Patent IVC.  No DVT B/L Iliac or Com fem V..  +++ DEEP V REFLUX B/L ILIAC & FEM V.  Absent Superficial V Reflux B/L x L SSV (too tortuous for intervention).  BL GSV occluded - prior ablation.   . ABDOMINAL VENOUS DUPLEX Bilateral 11/2014   Absent DVT.  Left common femoral venous reflux: 1508 MS left common femoral vein, 2284MS left proximal femoral vein -> cystic structure noted in the left popliteal vein (7.6 x 1.4 x 3.8 cm).;  Right leg deep venous reflux noted in the distal iliac, common femoral, proximal profunda femoral, proximal superficial femoral, superficial femoral and popliteal vein.  [name] CARDIAC CATHETERIZATION  10/2009   [facility] Cardiology - Dr. 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  EF~45%.  Mild pulmonary pretension.  [name] CARDIAC PET  08/2009   Inferolateral partially reversible defect -- > false positive by cath  . COLONOSCOPY    . CYSTOSCOPY/URETEROSCOPY/HOLMIUM LASER/STENT PLACEMENT Left 11/06/2019   Procedure: CYSTOSCOPY/RETROGRADE/URETEROSCOPY/HOLMIUM LASER/STENT  PLACEMENT. REMOVAL NEPHROSCOPYTUBE;  Surgeon: Ceasar Mons, MD;  Location: [facility];  Service: Urology;  Laterality: Left;  . ESOPHAGOGASTRODUODENOSCOPY    . GREATER SAPHENOUS VEIN ABLATION Bilateral Before 2015   [place] [facility] (Dr. Eugenie Filler)  . IR NEPHROSTOMY EXCHANGE LEFT  08/28/2019  . IR NEPHROSTOMY PLACEMENT LEFT  06/24/2019  . IR TRANSCATH PLC STENT  EA ADD VEIN  INC ANGIOPLASTY Left 10/2014   [facility], Dr. 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 IR TRANSCATH PLC STENT  INITIAL VEIN  INC ANGIOPLASTY Right 08/2014   [facility] - Dr. Stormy Fabian): US Guided --> R Common & External Iliac, Common Femoral Venography with Placement of 24 mmx 70 mm Stent - R Com Iliac-Ext Iliac- & Com Fem V. reducing 60-80% venous compression to ~0%).;; also noted significant L-sided venous compression (staged stenting).   [name] LIPOMA EXCISION     x 2 came back again  . NM MYOVIEW LTD  11/2005   Adenosine Myoview [facility] Cardiology - Dr. Cleda Mccreedy): Normal myocardial perfusion scan. No ischemia or infarction.  . TRANSTHORACIC ECHOCARDIOGRAM  04/26/2017    [facility] Cardiology - Dr. Cleda Mccreedy) - report not available.  Per clinic note: Normal global LV function. Mild concentric temperature. Mild TR. Mild pulmonary hypertension.;;   . TRANSTHORACIC ECHOCARDIOGRAM  7/'15; 7/'17   [facility] Cardiology - Dr. Saverio Danker) a) low normal LV function (EF 56%). Moderate concentric LVH. Mild LA dilation. Normal pulmonary pressures.  ;; b) normal global function - EF 60-65%. Moderate pulmonary hypertension. Biatrial enlargement. Moderate concentric LVH.  [name] TRANSTHORACIC ECHOCARDIOGRAM  02/2010   [facility] Cardiology - Dr. Saverio Danker): Normal global LV function. Mild LVH. EF 60%.; November 2010. Moderate LV  dilation. Moderate LVH. Normal EF 50-55%. Mild to moderate pulmonary hypertension. (July 2012 showed normal function with mild LVH, mild MR noted in August 2014)  . TRANSTHORACIC ECHOCARDIOGRAM  05/2019   During admission for UTI, sepsis with A. fib RVR: LV normal size and function.  EF 60-65%.  Mildly reduced RV size with severely reduced function.  Despite this normal atrial size noted with just only mild right atrial enlargement with elevated RAP (dilated IVC).  Relatively poor TR jet, but findings do suggest elevated RV pressures.     Current Outpatient Medications  Medication Sig Dispense Refill  . amLODipine (NORVASC) 10 MG tablet Take 10 mg by mouth daily with supper.     [name] atorvastatin (LIPITOR) 10 MG tablet Take 1 tablet (10 mg total) by mouth daily. (Patient taking differently: Take 10 mg by mouth  daily at 6 PM. ) 90 tablet 3  . HYDROcodone-acetaminophen (NORCO/VICODIN) 5-325 MG tablet Take 1 tablet by mouth 2 (two) times daily as needed for moderate pain. 5 tablet 0  . magnesium oxide (MAG-OX) 400 (241.3 Mg) MG tablet Take 1 tablet (400 mg total) by mouth daily.    . metoprolol tartrate (LOPRESSOR) 25 MG tablet TAKE 1 TABLET IN THE MORNING AND ONE AND ONE-HALF TABLETS (37.5 MG) IN THE EVENING 225 tablet 2  . XARELTO 20 MG TABS tablet TAKE 1 TABLET DAILY WITH SUPPER (Patient taking differently: Take 20 mg by mouth daily with supper. TAKE 1 TABLET DAILY WITH SUPPER) 90 tablet 4  . metolazone ([name]) 5 MG tablet Take 1 tablet (5 mg total) by mouth daily. FOR 2 DAYS 30 tablet 0  . potassium chloride SA (KLOR-CON) 20 MEQ tablet Take 1 tablet (20 mEq total) by mouth daily. 60 tablet 3  . torsemide (DEMADEX) 20 MG tablet Take 2 tablets (40 mg total) by mouth 2 (two) times daily. 360 tablet 3   No current facility-administered medications for this visit.    Allergies:   Aldactone [spironolactone]    Social History:  The patient  reports that he quit smoking about 25 years ago. His  smoking use included cigarettes. He has a 35.00 pack-year smoking history. He has never used smokeless tobacco. He reports current alcohol use. He reports that he does not use drugs.   Family History:  The patient's family history includes Cancer in his mother and sister; Heart attack (age of onset: 62) in his father; Hypertension in his brother.    ROS: All other systems are reviewed and negative. Unless otherwise mentioned in H&P    PHYSICAL EXAM: VS:  BP 129/60   Pulse 75   Ht 6\' 1"  (1.854 m)   Wt (!) 366 lb 12.8 oz (166.4 kg)   SpO2 99%   BMI 48.39 kg/m  , BMI Body mass index is 48.39 kg/m. GEN: Well nourished, well developed, in no acute distress HEENT: normal Neck: no JVD, carotid bruits, or masses Cardiac: RRR, distant heart sounds soft systolic murmur at the apex; no murmurs, rubs, or gallops, significant 3+ tight bilateral pretibial edema to the knees.   Respiratory:  Clear to auscultation bilaterally, normal work of breathing while seated, he does have New York Heart Association class II-III symptoms with ambulation GI: soft, nontender, nondistended, + BS MS: no deformity or atrophy Skin: warm and dry, no rash: Venous stasis skin discoloration. Neuro:  Strength and sensation are intact Psych: euthymic mood, full affect   EKG: Not completed this office visit.  Recent Labs: 08/07/2019: Magnesium 2.0 08/09/2019: ALT 61 11/05/2019: BUN 26; Creatinine, Ser 1.32; Hemoglobin 11.5; Platelets 218; Potassium 4.0; Sodium 143    Lipid Panel    Component Value Date/Time   TRIG 57 08/02/2019 2158      Wt Readings from Last 3 Encounters:  01/15/20 (!) 366 lb 12.8 oz (166.4 kg)  11/22/19 (!) 353 lb (160.1 kg)  11/05/19 (!) 344 lb 14.4 oz (156.4 kg)      Other studies Reviewed: Echocardiogram.  06/25/2019  1. Left ventricular ejection fraction, by visual estimation, is 60 to  65%. The left ventricle has normal function. Normal left ventricular size.  There is mildly  increased left ventricular hypertrophy.  2. Definity contrast agent was given IV to delineate the left ventricular  endocardial borders.  3. Global right ventricle has severely reduced systolic function.The  right ventricular size is mildly  enlarged.  4. Left atrial size was normal.  5. Right atrial size was mildly dilated.  6. The mitral valve is normal in structure. Trace mitral valve  regurgitation. No evidence of mitral stenosis.  7. The tricuspid valve is normal in structure. Tricuspid valve  regurgitation is trivial.  8. The aortic valve is tricuspid Aortic valve regurgitation was not  visualized by color flow Doppler. Structurally normal aortic valve, with  no evidence of sclerosis or stenosis.  9. The pulmonic valve was not well visualized. Pulmonic valve  regurgitation is not visualized by color flow Doppler.  10. Mildly elevated pulmonary artery systolic pressure.  11. The inferior vena cava is dilated in size with <50% respiratory  variability, suggesting right atrial pressure of 15 mmHg.  12. Definity used; normal LV function; mild LVH; right heart not well  visualized but right atrium and ventricle appear to be dilated with  significant RV dysfunction.    ASSESSMENT AND PLAN:  1.  Acute on chronic diastolic CHF: Multifactorial in the setting of grade 1 diastolic dysfunction along with heavy salt intake and and not adherence to low-salt regimen, along with morbid obesity.  He is on higher doses of Lasix over the last couple days with significant response.  I will try outpatient therapy at first.  I will change him from furosemide to torsemide 40 mg twice daily, give him metolazone 5 mg once a day for 2 days only.  We will give him supplemental potassium 20 mEq daily while he is on high doses of torsemide.  I will see him back in 4 days for reassessment.  Today I will check a BMET, a CBC, and a BNP to evaluate baseline.  We will repeat this in 4 days.  If he is not  finding any response over the next 48 hours from high dose of diuretic along with addition of metolazone, he may need to go to the ED for IV diuretic.  Dr. Margaretann Loveless has a contact at [facility] who is assisting with OP IV diuretics. He is internal medicine resident, Dr. Ina Homes, [phone]. This may be an option if he fails OP treatment to avoid hospitalization.  I have done extensive education on low-sodium diet.  His daughter has also reinforced this while I am seeing him.  When they return home he will remove all of the salty foods including the potato chips and snack foods so that he will be able to have less sodium in his diet.  He is to request a low-sodium diet when he returns back to his assisted living facility so that his meals can have reduced salt.  He is also given a copy of the "salty 6" information sheet.  I will see him in 4 days to ascertain his response to medication.  If he does not respond well he may need to be sent to ED where he can follow-up with Dr. Tarri Abernethy.  Current medicines are reviewed at length with the patient today.  I have spent 45 minutes dedicated to the care of this patient on the date of this encounter to include pre-visit review of records, assessment, management and diagnostic testing,with shared decision making.  Labs/ tests ordered today include: BMET. BNP, CBC Phill Myron. [place] Pugh, ANP, AACC   01/15/2020 4:34 PM    [facility] [place] [address] Office [phone] Fax [phone]  Notice: This dictation was prepared with Dragon dictation along with smaller phrase technology. Any transcriptional errors that result  from this process are unintentional and may not be corrected upon review.

## 2020-01-15 NOTE — Patient Instructions (Signed)
Medication Instructions:  STOP- Furosemide(Lasix) START- Torsemide 40 mg by mouth twice a day TAKE- Metolazone 5 mg by mouth daily for 2 days  *If you need a refill on your cardiac medications before your next appointment, please call your pharmacy*   Lab Work: BMP and CBC BMP on Monday  If you have labs (blood work) drawn today and your tests are completely normal, you will receive your results only by: [name] MyChart Message (if you have MyChart) OR . A paper copy in the mail If you have any lab test that is abnormal or we need to change your treatment, we will call you to review the results.   Testing/Procedures: None Ordered   Follow-Up: At [facility], you and your health needs are our priority.  As part of our continuing mission to provide you with exceptional heart care, we have created designated Provider Care Teams.  These Care Teams include your primary Cardiologist (physician) and Advanced Practice Providers (APPs -  Physician Assistants and Nurse Practitioners) who all work together to provide you with the care you need, when you need it.  We recommend signing up for the patient portal called "MyChart".  Sign up information is provided on this After Visit Summary.  MyChart is used to connect with patients for Virtual Visits (Telemedicine).  Patients are able to view lab/test results, encounter notes, upcoming appointments, etc.  Non-urgent messages can be sent to your provider as well.   To learn more about what you can do with MyChart, go to [email or web address].    Your next appointment:   Monday  The format for your next appointment:   In Person  Provider:   [name], DNP, ANP

## 2020-01-16 LAB — CBC
Hematocrit: 38.1 % (ref 37.5–51.0)
Hemoglobin: 12.1 g/dL — ABNORMAL LOW (ref 13.0–17.7)
MCH: 29.4 pg (ref 26.6–33.0)
MCHC: 31.8 g/dL (ref 31.5–35.7)
MCV: 93 fL (ref 79–97)
Platelets: 201 10*3/uL (ref 150–450)
RBC: 4.11 x10E6/uL — ABNORMAL LOW (ref 4.14–5.80)
RDW: 14.2 % (ref 11.6–15.4)
WBC: 5.4 10*3/uL (ref 3.4–10.8)

## 2020-01-16 LAB — BASIC METABOLIC PANEL
BUN/Creatinine Ratio: 14 (ref 10–24)
BUN: 21 mg/dL (ref 8–27)
CO2: 26 mmol/L (ref 20–29)
Calcium: 9 mg/dL (ref 8.6–10.2)
Chloride: 108 mmol/L — ABNORMAL HIGH (ref 96–106)
Creatinine, Ser: 1.47 mg/dL — ABNORMAL HIGH (ref 0.76–1.27)
GFR calc Af Amer: 52 mL/min/{1.73_m2} — ABNORMAL LOW (ref >59)
GFR calc non Af Amer: 45 mL/min/{1.73_m2} — ABNORMAL LOW (ref >59)
Glucose: 106 mg/dL — ABNORMAL HIGH (ref 65–99)
Potassium: 5.1 mmol/L (ref 3.5–5.2)
Sodium: 145 mmol/L — ABNORMAL HIGH (ref 134–144)

## 2020-01-20 ENCOUNTER — Encounter: Payer: Self-pay | Admitting: Adult Health

## 2020-01-20 ENCOUNTER — Other Ambulatory Visit: Payer: Self-pay

## 2020-01-20 ENCOUNTER — Ambulatory Visit (INDEPENDENT_AMBULATORY_CARE_PROVIDER_SITE_OTHER): Payer: Medicare Other | Admitting: Adult Health

## 2020-01-20 VITALS — BP 102/52 | HR 70 | Ht 73.0 in | Wt 340.0 lb

## 2020-01-20 DIAGNOSIS — Z79899 Other long term (current) drug therapy: Secondary | ICD-10-CM | POA: Diagnosis not present

## 2020-01-20 DIAGNOSIS — I5032 Chronic diastolic (congestive) heart failure: Secondary | ICD-10-CM

## 2020-01-20 LAB — SPECIMEN STATUS REPORT

## 2020-01-20 NOTE — Patient Instructions (Signed)
Medication Instructions:  Continue current medications  *If you need a refill on your cardiac medications before your next appointment, please call your pharmacy*   Lab Work: BMP and BNP Today  If you have labs (blood work) drawn today and your tests are completely normal, you will receive your results only by: [name] MyChart Message (if you have MyChart) OR . A paper copy in the mail If you have any lab test that is abnormal or we need to change your treatment, we will call you to review the results.   Testing/Procedures: None Ordered   Follow-Up: At [facility], you and your health needs are our priority.  As part of our continuing mission to provide you with exceptional heart care, we have created designated Provider Care Teams.  These Care Teams include your primary Cardiologist (physician) and Advanced Practice Providers (APPs -  Physician Assistants and Nurse Practitioners) who all work together to provide you with the care you need, when you need it.  We recommend signing up for the patient portal called "MyChart".  Sign up information is provided on this After Visit Summary.  MyChart is used to connect with patients for Virtual Visits (Telemedicine).  Patients are able to view lab/test results, encounter notes, upcoming appointments, etc.  Non-urgent messages can be sent to your provider as well.   To learn more about what you can do with MyChart, go to [email or web address].    Your next appointment:   Keep appointment on June 3 rd @ 11:20 am  The format for your next appointment:   In Person  Provider:   Glenetta Hew, MD

## 2020-01-20 NOTE — Progress Notes (Signed)
Cardiology Office Note   Date:  01/20/2020   ID:  Patrick Bishop, DOB 07/06/1941, MRN [id number]  PCP:  Patrick Amel, MD  Cardiologist: Dr. Ellyn Bishop No chief complaint on file.    History of Present Illness: Patrick Bishop is a 79 y.o. male who presents for focused follow-up visit after being seen 4 days ago for decompensated CHF.  He has a history of chronic diastolic heart failure, chronic persistent atrial fibrillation on Xarelto, pulmonary hypertension, chronic bilateral dependent edema, history of OSA and O at bedtime.  When I saw him last he had been eating a lot of salted foods, his weight was up 35 pounds since last office visit in February 2021.  As a result of this I gave him aggressive diuretics to avoid hospitalization.  He did not wish to be placed in the hospital either.  I change his Lasix to torsemide 40 mg twice daily, gave him 2 days of metolazone 5 mg in the morning, and continued him on the torsemide 40 mg twice daily thereafter.  On follow-up he has lost 26 pounds breathing some better he continues to have some lower extremity edema with woody skin thickening, but less prominent.  He states his breathing status is "about the same" but he is not very ambulatory and often uses a walker for ambulation.  He and his daughter have removed all the salted foods from his home.   Past Medical History:  Diagnosis Date  . Arthritis   . CHF (congestive heart failure) ([place])   . Chronic diastolic heart failure (HCC)    Normal LV Fxn by Echo 04/2017   . Chronic venous insufficiency 2015   s/p Bilateral Iliac & Femoral Stents (including IVC) - DEEP Venous Reflux.  Also s/p Bilateral GSV Ablation.  [name] Dyspnea   . Dysrhythmia    a fib   . Essential hypertension   . History of kidney stones   . Hx of adenomatous colonic polyps   . Hyperlipidemia with target LDL less than 100   . Longstanding persistent atrial fibrillation ([place])   . Nephrolithiasis 05/2019  . Obesity   . OSA on  CPAP    May need New CPAP machine & CPAP MD)  . Pneumonia   . Pre-diabetes   . Pulmonary hypertension ([place])   . Pyelonephritis 05/2019  . Venous insufficiency    LE    Past Surgical History:  Procedure Laterality Date  . ABDOMINAL VENOUS DUPLEX Bilateral 07/2014   ([place]. Cardiology: Dr. Cleda Bishop): (prior to Bilateral Iliac-Femoral Venous Stent Placement): Patent IVC.  No DVT B/L Iliac or Com fem V..  +++ DEEP V REFLUX B/L ILIAC & FEM V.  Absent Superficial V Reflux B/L x L SSV (too tortuous for intervention).  BL GSV occluded - prior ablation.   . ABDOMINAL VENOUS DUPLEX Bilateral 11/2014   Absent DVT.  Left common femoral venous reflux: 1508 MS left common femoral vein, 2284MS left proximal femoral vein -> cystic structure noted in the left popliteal vein (7.6 x 1.4 x 3.8 cm).;  Right leg deep venous reflux noted in the distal iliac, common femoral, proximal profunda femoral, proximal superficial femoral, superficial femoral and popliteal vein.  [name] CARDIAC CATHETERIZATION  10/2009   [facility] Cardiology - Dr. 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  EF~45%.  Mild pulmonary pretension.  [name] CARDIAC PET  08/2009   Inferolateral partially reversible defect -- > false positive by cath  . COLONOSCOPY    .  CYSTOSCOPY/URETEROSCOPY/HOLMIUM LASER/STENT PLACEMENT Left 11/06/2019   Procedure: CYSTOSCOPY/RETROGRADE/URETEROSCOPY/HOLMIUM LASER/STENT PLACEMENT. REMOVAL NEPHROSCOPYTUBE;  Surgeon: Patrick Mons, MD;  Location: [facility];  Service: Urology;  Laterality: Left;  . ESOPHAGOGASTRODUODENOSCOPY    . GREATER SAPHENOUS VEIN ABLATION Bilateral Before 2015   [facility] (Dr. Eugenie Bishop)  . IR NEPHROSTOMY EXCHANGE LEFT  08/28/2019  . IR NEPHROSTOMY PLACEMENT LEFT  06/24/2019  . IR TRANSCATH PLC STENT  EA ADD VEIN  INC ANGIOPLASTY Left 10/2014   [facility], Dr. 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 IR TRANSCATH PLC STENT  INITIAL VEIN  INC ANGIOPLASTY Right 08/2014   [facility] - Dr. Stormy Bishop): US Guided --> R Common & External Iliac, Common Femoral Venography with Placement of 24 mmx 70 mm Stent - R Com Iliac-Ext Iliac- & Com Fem V. reducing 60-80% venous compression to ~0%).;; also noted significant L-sided venous compression (staged stenting).   [name] LIPOMA EXCISION     x 2 came back again  . NM MYOVIEW LTD  11/2005   Adenosine Myoview [facility] Cardiology - Dr. Cleda Bishop): Normal myocardial perfusion scan. No ischemia or infarction.  . TRANSTHORACIC ECHOCARDIOGRAM  04/26/2017    [facility] Cardiology - Dr. Cleda Bishop) - report not available.  Per clinic note: Normal global LV function. Mild concentric temperature. Mild TR. Mild pulmonary hypertension.;;   . TRANSTHORACIC ECHOCARDIOGRAM  7/'15; 7/'17   [facility] Cardiology - Dr. Saverio Bishop) a) low normal LV function (EF 56%). Moderate concentric LVH. Mild LA dilation. Normal pulmonary pressures.  ;; b) normal global function - EF 60-65%. Moderate pulmonary hypertension. Biatrial enlargement. Moderate concentric LVH.  [name] TRANSTHORACIC ECHOCARDIOGRAM  02/2010   [facility] Cardiology - Dr. Saverio Bishop): Normal global LV function. Mild LVH. EF 60%.; November 2010. Moderate LV dilation. Moderate LVH. Normal EF 50-55%. Mild to moderate pulmonary hypertension. (July 2012 showed normal function with mild LVH, mild MR noted in August 2014)  . TRANSTHORACIC ECHOCARDIOGRAM  05/2019   During admission for UTI, sepsis with A. fib RVR: LV normal size and function.  EF 60-65%.  Mildly reduced RV size with severely reduced function.  Despite this normal atrial size noted with just only mild right atrial enlargement with elevated RAP (dilated IVC).  Relatively poor TR jet, but findings do  suggest elevated RV pressures.     Current Outpatient Medications  Medication Sig Dispense Refill  . amLODipine (NORVASC) 10 MG tablet Take 10 mg by mouth daily with supper.     [name] atorvastatin (LIPITOR) 10 MG tablet Take 1 tablet (10 mg total) by mouth daily. (Patient taking differently: Take 10 mg by mouth daily at 6 PM. ) 90 tablet 3  . HYDROcodone-acetaminophen (NORCO/VICODIN) 5-325 MG tablet Take 1 tablet by mouth 2 (two) times daily as needed for moderate pain. 5 tablet 0  . magnesium oxide (MAG-OX) 400 (241.3 Mg) MG tablet Take 1 tablet (400 mg total) by mouth daily.    . metolazone ([name]) 5 MG tablet TAKE 1 TABLET(5 MG) BY MOUTH DAILY FOR 2 DAYS 90 tablet 3  . metoprolol tartrate (LOPRESSOR) 25 MG tablet TAKE 1 TABLET IN THE MORNING AND ONE AND ONE-HALF TABLETS (37.5 MG) IN THE EVENING 225 tablet 2  . potassium chloride SA (KLOR-CON) 20 MEQ tablet Take 1 tablet (20 mEq  total) by mouth daily. 60 tablet 3  . torsemide (DEMADEX) 20 MG tablet Take 2 tablets (40 mg total) by mouth 2 (two) times daily. 360 tablet 3  . XARELTO 20 MG TABS tablet TAKE 1 TABLET DAILY WITH SUPPER (Patient taking differently: Take 20 mg by mouth daily with supper. TAKE 1 TABLET DAILY WITH SUPPER) 90 tablet 4   No current facility-administered medications for this visit.    Allergies:   Aldactone [spironolactone]    Social History:  The patient  reports that he quit smoking about 25 years ago. His smoking use included cigarettes. He has a 35.00 pack-year smoking history. He has never used smokeless tobacco. He reports current alcohol use. He reports that he does not use drugs.   Family History:  The patient's family history includes Cancer in his mother and sister; Heart attack (age of onset: 63) in his father; Hypertension in his brother.    ROS: All other systems are reviewed and negative. Unless otherwise mentioned in H&P    PHYSICAL EXAM: VS:  BP (!) 102/52   Pulse 70   Ht 6\' 1"  (1.854 m)   Wt  (!) 340 lb (154.2 kg)   SpO2 95%   BMI 44.86 kg/m  , BMI Body mass index is 44.86 kg/m. GEN: Well nourished, well developed, in no acute distress HEENT: normal Neck: no JVD, carotid bruits, or masses Cardiac: RRR; no murmurs, rubs, or gallops,no edema  Respiratory:  Clear to auscultation bilaterally, normal work of breathing GI: soft, nontender, nondistended, + BS MS: no deformity or atrophy Skin: warm and dry, no rash Neuro:  Strength and sensation are intact Psych: euthymic mood, full affect   EKG:  Not completed this office visit.  Recent Labs: 08/07/2019: Magnesium 2.0 08/09/2019: ALT 61 01/15/2020: BUN 21; Creatinine, Ser 1.47; Hemoglobin 12.1; Platelets 201; Potassium 5.1; Sodium 145    Lipid Panel    Component Value Date/Time   TRIG 57 08/02/2019 2158      Wt Readings from Last 3 Encounters:  01/20/20 (!) 340 lb (154.2 kg)  01/15/20 (!) 366 lb 12.8 oz (166.4 kg)  11/22/19 (!) 353 lb (160.1 kg)      Other studies Reviewed: 06/25/2019  1. Left ventricular ejection fraction, by visual estimation, is 60 to  65%. The left ventricle has normal function. Normal left ventricular size.  There is mildly increased left ventricular hypertrophy.  2. Definity contrast agent was given IV to delineate the left ventricular  endocardial borders.  3. Global right ventricle has severely reduced systolic function.The  right ventricular size is mildly enlarged.  4. Left atrial size was normal.  5. Right atrial size was mildly dilated.  6. The mitral valve is normal in structure. Trace mitral valve  regurgitation. No evidence of mitral stenosis.  7. The tricuspid valve is normal in structure. Tricuspid valve  regurgitation is trivial.  8. The aortic valve is tricuspid Aortic valve regurgitation was not  visualized by color flow Doppler. Structurally normal aortic valve, with  no evidence of sclerosis or stenosis.  9. The pulmonic valve was not well visualized. Pulmonic  valve  regurgitation is not visualized by color flow Doppler.  10. Mildly elevated pulmonary artery systolic pressure.  11. The inferior vena cava is dilated in size with <50% respiratory  variability, suggesting right atrial pressure of 15 mmHg.  12. Definity used; normal LV function; mild LVH; right heart not well  visualized but right atrium and ventricle appear to be dilated with  significant RV dysfunction.    ASSESSMENT AND PLAN:  1.  Acute on chronic diastolic heart failure: After aggressive p.o. diuretics he has lost 26 pounds over the last 4 days.  I am encouraged by this.  I am ordering a BNP and a BM ET today to evaluate his status and make decision concerning continuing higher dose of torsemide 40 mg twice daily based upon his kidney function.  He has chronic lower extremity edema related to dependence and obesity with poor ambulatory ability.  He states that his weight is normally about 15 pounds lower.  I will make a decision based upon his lab work and call him for recommendations.  2.  Chronic dependent lower extremity edema: Multifactorial in the setting of obesity, poor ambulatory status.  There has been improvement in his lower extremity edema however it is not completely eliminated.  He will continue low-sodium diet.  I will make further recommendations based upon lab work.   Current medicines are reviewed at length with the patient today.  I have spent 20 minutes dedicated to the care of this patient on the date of this encounter to include pre-visit review of records, assessment, management and diagnostic testing,with shared decision making.  Labs/ tests ordered today include: BNP, BMET Patrick Bishop. Patrick Bishop, ANP, AACC   01/20/2020 1:14 PM    [facility] [place] [address] Office [phone] Fax [phone]  Notice: This dictation was prepared with Dragon dictation along with smaller phrase technology. Any transcriptional errors  that result from this process are unintentional and may not be corrected upon review.

## 2020-01-21 ENCOUNTER — Telehealth: Payer: Self-pay | Admitting: *Deleted

## 2020-01-21 DIAGNOSIS — Z79899 Other long term (current) drug therapy: Secondary | ICD-10-CM

## 2020-01-21 DIAGNOSIS — M25551 Pain in right hip: Secondary | ICD-10-CM | POA: Diagnosis not present

## 2020-01-21 DIAGNOSIS — Z79891 Long term (current) use of opiate analgesic: Secondary | ICD-10-CM | POA: Diagnosis not present

## 2020-01-21 DIAGNOSIS — M25561 Pain in right knee: Secondary | ICD-10-CM | POA: Diagnosis not present

## 2020-01-21 DIAGNOSIS — I739 Peripheral vascular disease, unspecified: Secondary | ICD-10-CM | POA: Diagnosis not present

## 2020-01-21 DIAGNOSIS — G894 Chronic pain syndrome: Secondary | ICD-10-CM | POA: Diagnosis not present

## 2020-01-21 LAB — BASIC METABOLIC PANEL
BUN/Creatinine Ratio: 23 (ref 10–24)
BUN: 44 mg/dL — ABNORMAL HIGH (ref 8–27)
CO2: 28 mmol/L (ref 20–29)
Calcium: 9.6 mg/dL (ref 8.6–10.2)
Chloride: 93 mmol/L — ABNORMAL LOW (ref 96–106)
Creatinine, Ser: 1.89 mg/dL — ABNORMAL HIGH (ref 0.76–1.27)
GFR calc Af Amer: 38 mL/min/{1.73_m2} — ABNORMAL LOW (ref >59)
GFR calc non Af Amer: 33 mL/min/{1.73_m2} — ABNORMAL LOW (ref >59)
Glucose: 110 mg/dL — ABNORMAL HIGH (ref 65–99)
Potassium: 3.5 mmol/L (ref 3.5–5.2)
Sodium: 141 mmol/L (ref 134–144)

## 2020-01-21 LAB — BRAIN NATRIURETIC PEPTIDE: BNP: 89 pg/mL (ref 0.0–100.0)

## 2020-01-21 NOTE — Telephone Encounter (Signed)
Pt aware of his blood work, BMP ordered and mailed to pt

## 2020-01-21 NOTE — Telephone Encounter (Signed)
-----   Message from Lendon Colonel, NP sent at 01/21/2020  8:07 AM EDT ----- Continue lower dose of  torsemide 40 mg in the am, with potassium supplements. Only use extra 20 mg dose for increased weight if necessary in the afternoon. Imperative that low - to no sodium diet be maintained. Repeat labs in  2 weeks.

## 2020-01-27 DIAGNOSIS — Z79899 Other long term (current) drug therapy: Secondary | ICD-10-CM | POA: Diagnosis not present

## 2020-01-27 DIAGNOSIS — I4811 Longstanding persistent atrial fibrillation: Secondary | ICD-10-CM | POA: Diagnosis not present

## 2020-01-27 DIAGNOSIS — I272 Pulmonary hypertension, unspecified: Secondary | ICD-10-CM | POA: Diagnosis not present

## 2020-01-27 DIAGNOSIS — M170 Bilateral primary osteoarthritis of knee: Secondary | ICD-10-CM | POA: Diagnosis not present

## 2020-01-27 DIAGNOSIS — I739 Peripheral vascular disease, unspecified: Secondary | ICD-10-CM | POA: Diagnosis not present

## 2020-01-27 DIAGNOSIS — I714 Abdominal aortic aneurysm, without rupture: Secondary | ICD-10-CM | POA: Diagnosis not present

## 2020-01-27 DIAGNOSIS — I890 Lymphedema, not elsewhere classified: Secondary | ICD-10-CM | POA: Diagnosis not present

## 2020-01-27 DIAGNOSIS — I5032 Chronic diastolic (congestive) heart failure: Secondary | ICD-10-CM | POA: Diagnosis not present

## 2020-01-27 DIAGNOSIS — E7800 Pure hypercholesterolemia, unspecified: Secondary | ICD-10-CM | POA: Diagnosis not present

## 2020-01-27 DIAGNOSIS — Z0001 Encounter for general adult medical examination with abnormal findings: Secondary | ICD-10-CM | POA: Diagnosis not present

## 2020-01-27 DIAGNOSIS — I10 Essential (primary) hypertension: Secondary | ICD-10-CM | POA: Diagnosis not present

## 2020-01-29 DIAGNOSIS — H25811 Combined forms of age-related cataract, right eye: Secondary | ICD-10-CM | POA: Diagnosis not present

## 2020-01-29 DIAGNOSIS — H2511 Age-related nuclear cataract, right eye: Secondary | ICD-10-CM | POA: Diagnosis not present

## 2020-02-03 ENCOUNTER — Telehealth: Payer: Self-pay | Admitting: Adult Health

## 2020-02-03 NOTE — Telephone Encounter (Signed)
Yes he can increase his dose to 40 mg BID for 3 days and then go back to usual doses. Let us know how he is doing with the fluid.   KL

## 2020-02-03 NOTE — Telephone Encounter (Signed)
Returned the call to the patient. He stated that he feels like he is gaining fluid weight again. His weight today was 342 pounds. He has bilateral lower extremity edema and feels like some may be in his abdomen. He denies shortness of breath.  According to his last lab results he was supposed to take Torsemide 40 mg once daily He stated that he did not do that because he has had extra fluid. He has been on Torsemide 40 mg in the AM and 20 mg in the PM.  He would like to know if he can increase this to 40 mg bid. He is due for a repeat BMET this week.

## 2020-02-03 NOTE — Telephone Encounter (Signed)
The patient has been made aware and verbalized his understanding. He will call back with an update after the 3 days.

## 2020-02-03 NOTE — Telephone Encounter (Signed)
Pt c/o medication issue:  1. Name of Medication: torsemide (DEMADEX) 20 MG tablet  2. How are you currently taking this medication (dosage and times per day)? 2 at AM and 1 at PM  3. Are you having a reaction (difficulty breathing--STAT)? No  4. What is your medication issue? Patient states that he was taking 2 at AM and 2 at PM. He then went down to 2 at AM and 1 at PM. Since then he has had cataract surgery and was off for a couple of days. He has gained 10 pounds since and would like to discuss changing the dosage on this medication.

## 2020-02-07 ENCOUNTER — Ambulatory Visit
Admission: RE | Admit: 2020-02-07 | Discharge: 2020-02-07 | Disposition: A | Discharge status: Home / Self Care | Payer: Medicare Other | Source: Ambulatory Visit | Attending: Physician Assistant | Admitting: Physician Assistant

## 2020-02-07 ENCOUNTER — Other Ambulatory Visit: Payer: Self-pay | Admitting: Physician Assistant

## 2020-02-07 ENCOUNTER — Other Ambulatory Visit: Payer: Self-pay

## 2020-02-07 DIAGNOSIS — M1712 Unilateral primary osteoarthritis, left knee: Secondary | ICD-10-CM | POA: Diagnosis not present

## 2020-02-07 DIAGNOSIS — M160 Bilateral primary osteoarthritis of hip: Secondary | ICD-10-CM | POA: Diagnosis not present

## 2020-02-07 DIAGNOSIS — M25562 Pain in left knee: Secondary | ICD-10-CM

## 2020-02-07 DIAGNOSIS — G8929 Other chronic pain: Secondary | ICD-10-CM

## 2020-02-07 DIAGNOSIS — M25561 Pain in right knee: Secondary | ICD-10-CM

## 2020-02-07 DIAGNOSIS — M1711 Unilateral primary osteoarthritis, right knee: Secondary | ICD-10-CM | POA: Diagnosis not present

## 2020-02-10 DIAGNOSIS — Z79899 Other long term (current) drug therapy: Secondary | ICD-10-CM | POA: Diagnosis not present

## 2020-02-11 ENCOUNTER — Other Ambulatory Visit (HOSPITAL_COMMUNITY)
Admission: RE | Admit: 2020-02-11 | Discharge: 2020-02-11 | Disposition: A | Discharge status: Home / Self Care | Payer: Medicare Other | Source: Ambulatory Visit | Attending: Internal Medicine | Admitting: Internal Medicine

## 2020-02-11 DIAGNOSIS — Z01812 Encounter for preprocedural laboratory examination: Secondary | ICD-10-CM | POA: Diagnosis not present

## 2020-02-11 DIAGNOSIS — Z20822 Contact with and (suspected) exposure to covid-19: Secondary | ICD-10-CM | POA: Diagnosis not present

## 2020-02-11 LAB — BASIC METABOLIC PANEL
BUN/Creatinine Ratio: 15 (ref 10–24)
BUN: 27 mg/dL (ref 8–27)
CO2: 23 mmol/L (ref 20–29)
Calcium: 9.2 mg/dL (ref 8.6–10.2)
Chloride: 104 mmol/L (ref 96–106)
Creatinine, Ser: 1.75 mg/dL — ABNORMAL HIGH (ref 0.76–1.27)
GFR calc Af Amer: 42 mL/min/{1.73_m2} — ABNORMAL LOW (ref >59)
GFR calc non Af Amer: 36 mL/min/{1.73_m2} — ABNORMAL LOW (ref >59)
Glucose: 98 mg/dL (ref 65–99)
Potassium: 5 mmol/L (ref 3.5–5.2)
Sodium: 143 mmol/L (ref 134–144)

## 2020-02-11 LAB — SARS CORONAVIRUS 2 (TAT 6-24 HRS): SARS Coronavirus 2: NEGATIVE

## 2020-02-13 ENCOUNTER — Ambulatory Visit (HOSPITAL_BASED_OUTPATIENT_CLINIC_OR_DEPARTMENT_OTHER): Payer: Medicare Other | Attending: Internal Medicine | Admitting: Internal Medicine

## 2020-02-13 ENCOUNTER — Other Ambulatory Visit: Payer: Self-pay

## 2020-02-13 DIAGNOSIS — G4730 Sleep apnea, unspecified: Secondary | ICD-10-CM

## 2020-02-13 DIAGNOSIS — R0683 Snoring: Secondary | ICD-10-CM | POA: Insufficient documentation

## 2020-02-13 DIAGNOSIS — G4710 Hypersomnia, unspecified: Secondary | ICD-10-CM | POA: Insufficient documentation

## 2020-02-13 DIAGNOSIS — G4733 Obstructive sleep apnea (adult) (pediatric): Secondary | ICD-10-CM | POA: Diagnosis not present

## 2020-02-14 ENCOUNTER — Telehealth: Payer: Self-pay | Admitting: Cardiology

## 2020-02-14 NOTE — Telephone Encounter (Signed)
Patient returning Nyasha's call in regards to lab results.

## 2020-02-14 NOTE — Telephone Encounter (Signed)
Spoke with pt and reviewed labs and recommendations from [employer]. He states he is up between 10 and 12lbs for the last 2 weeks. He did not sound audible SOB. I reviewed instructions with him to increase his evening torsemide from 20mg  to 40mg . He is going to do this starting today through Sunday. He will call the office on Monday morning to discuss weight changes with Patrick Bishop.  Will forward to [facility] for review as well.

## 2020-02-16 NOTE — Telephone Encounter (Signed)
Thank you! ° °KL °

## 2020-02-17 ENCOUNTER — Telehealth: Payer: Self-pay | Admitting: Adult Health

## 2020-02-17 NOTE — Telephone Encounter (Signed)
Patient is calling to follow up in regards to recommendations from [name]. He states he is requesting to speak with her. Please call.

## 2020-02-17 NOTE — Telephone Encounter (Signed)
Spoke with patient by phone today.  He was having worsening edema and therefore he increased his torsemide from 80 mg in the morning and 40 mg in the evening to 80 mg twice daily.  He has lost approximately 5 pounds, and is losing about a half a pound a day since that time.  He continues to struggle with salt.  I reinforced the need for daily weight, elimination of salt from his diet to allow the diuretics to work better and have less edema.  He verbalizes understanding.  I will see him in approximately 10 days at which time I will be able to ascertain how well he is doing.  I plan on doing a BMET at that time.

## 2020-02-18 DIAGNOSIS — M1710 Unilateral primary osteoarthritis, unspecified knee: Secondary | ICD-10-CM | POA: Diagnosis not present

## 2020-02-18 DIAGNOSIS — Z79899 Other long term (current) drug therapy: Secondary | ICD-10-CM | POA: Diagnosis not present

## 2020-02-18 DIAGNOSIS — Z79891 Long term (current) use of opiate analgesic: Secondary | ICD-10-CM | POA: Diagnosis not present

## 2020-02-18 DIAGNOSIS — G894 Chronic pain syndrome: Secondary | ICD-10-CM | POA: Diagnosis not present

## 2020-02-18 DIAGNOSIS — M169 Osteoarthritis of hip, unspecified: Secondary | ICD-10-CM | POA: Diagnosis not present

## 2020-02-19 ENCOUNTER — Encounter: Payer: Self-pay | Admitting: Pulmonary Disease

## 2020-02-19 ENCOUNTER — Other Ambulatory Visit: Payer: Self-pay

## 2020-02-19 ENCOUNTER — Ambulatory Visit (INDEPENDENT_AMBULATORY_CARE_PROVIDER_SITE_OTHER): Payer: Medicare Other | Admitting: Pulmonary Disease

## 2020-02-19 VITALS — BP 122/64 | HR 59 | Temp 98.4°F | Ht 73.0 in | Wt 341.0 lb

## 2020-02-19 DIAGNOSIS — J1282 Pneumonia due to coronavirus disease 2019: Secondary | ICD-10-CM

## 2020-02-19 DIAGNOSIS — R0600 Dyspnea, unspecified: Secondary | ICD-10-CM | POA: Diagnosis not present

## 2020-02-19 DIAGNOSIS — U071 COVID-19: Secondary | ICD-10-CM | POA: Diagnosis not present

## 2020-02-19 NOTE — Progress Notes (Signed)
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 Urology was consulted for for left nephrostomy tube, UJP stone with hydronephrosis.  He is being evaluated for a urologic procedure for removal of UPJ stone and is sent here for preop evaluation and clearance States that his breathing is improved since discharge from hospital but continues to have occasional dyspnea on exertion.  No cough, sputum production, fevers, chills.  Does not need supplemental oxygen.  Underwent cystoscopy and ureteroscopy on 11/06/19 without issue or respiratory exacerbation   Pets: No pets Occupation: Worked as an Runner, broadcasting/film/video and at [employer] Exposures: No known exposures.  No mold, hot tub, Jacuzzi.  No down pillows or comforter Smoking history: 35-pack-year smoker.  Quit in 1996 Travel history: No significant travel Relevant family history: No significant family history of lung disease  Interim history: Continues to improve with regard to his breathing He had a sleep study on 5/24 ordered by his primary care.  It has not been read yet  Outpatient Encounter Medications as of 02/19/2020  Medication Sig  . amLODipine (NORVASC) 10 MG tablet Take 10 mg by mouth daily with supper.   [name] atorvastatin (LIPITOR) 10 MG tablet Take 1 tablet (10 mg total) by mouth daily. (Patient taking differently: Take 10 mg by mouth daily at 6 PM. )  . Buprenorphine HCl 300 MCG FILM Place 300 mcg inside  cheek 2 (two) times daily.  [name] HYDROcodone-acetaminophen (NORCO/VICODIN) 5-325 MG tablet Take 1 tablet by mouth 2 (two) times daily as needed for moderate pain.  . magnesium oxide (MAG-OX) 400 (241.3 Mg) MG tablet Take 1 tablet (400 mg total) by mouth daily.  . metolazone ([name]) 5 MG tablet TAKE 1 TABLET(5 MG) BY MOUTH DAILY FOR 2 DAYS  . metoprolol tartrate (LOPRESSOR) 25 MG tablet TAKE 1 TABLET IN THE MORNING AND ONE AND ONE-HALF TABLETS (37.5 MG) IN THE EVENING  . potassium chloride SA (KLOR-CON) 20 MEQ tablet Take 1 tablet (20 mEq total) by mouth daily.  [name] torsemide (DEMADEX) 20 MG tablet Take 2 tablets (40 mg total) by mouth 2 (two) times daily.  Alveda Reasons 20 MG TABS tablet TAKE 1 TABLET DAILY WITH SUPPER (Patient taking differently: Take 20 mg by mouth daily with supper. TAKE 1 TABLET DAILY WITH SUPPER)   No facility-administered encounter medications on file as of 02/19/2020.   Physical Exam: Blood pressure 122/64, pulse (!) 59, temperature 98.4 F (36.9 C), temperature source Oral, height 6\' 1"  (1.854 m), weight (!) 341 lb (154.7 kg), SpO2 98 %. Gen:      No acute distress HEENT:  EOMI, sclera anicteric Neck:     No masses; no thyromegaly Lungs:    Clear to auscultation bilaterally; normal respiratory effort CV:         Regular rate and rhythm; no murmurs Abd:      + bowel sounds; soft, non-tender; no palpable masses, no distension Ext:    No edema; adequate peripheral perfusion Skin:      Warm and dry; no rash  Neuro: alert and oriented x 3 Psych: normal mood and affect  Data Reviewed: Imaging: Chest x-ray 08/02/2019-cardiomegaly, right basilar scarring or atelectasis.  CT abdomen pelvis 08/08/2019-no abnormality in the visualized lung base.  Left nephrostomy catheter.  No hydronephrosis.  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    CT high-resolution 11/04/2019-reticulation and groundglass opacities in the lower lobe.  Chronic right diaphragm elevation  mMRC  11/22/2019-3  PFTs: 12/18/2019 FVC 3.64 [79%], FEV1 2.71 [82%], F/F 74, TLC 5.56 [72%], DLCO 22.11 [82%] Mild restriction  Cardiac: Echocardiogram 06/25/2019-LVEF 60-65%, enlarged RV with severely reduced RV systolic function RVSP 35  Assessment:  Post COVID-19 pneumonia CT scan reviewed with minimal reticulation and groundglass opacities. Overall he appears to be improving.  Continue monitoring  Pulmonary hypertension Secondary to atrial fibrillation, diastolic heart failure, OSA Follows with cardiology  OSA Stable on CPAP.  He has an outside sleep doctor but cannot recall the name  Plan/Recommendations: Follow-up CT in 3 months Return to clinic after CT.  Marshell Garfinkel MD [facility] 02/19/2020, 1:42 PM  CC: Lujean Amel, MD

## 2020-02-19 NOTE — Patient Instructions (Addendum)
I am glad you are starting to feel better with regard to your breathing Your lung function does show mild reduction. We will continue monitoring Follow-up with high-resolution CT in 3 months and return to clinic.

## 2020-02-20 ENCOUNTER — Telehealth: Payer: Self-pay | Admitting: *Deleted

## 2020-02-20 DIAGNOSIS — Z79899 Other long term (current) drug therapy: Secondary | ICD-10-CM

## 2020-02-20 MED ORDER — METOLAZONE 5 MG PO TABS: 5.0000 mg | ORAL_TABLET | Freq: Every day | ORAL | 0 refills | Status: DC

## 2020-02-20 NOTE — Telephone Encounter (Signed)
-----   Message from Lendon Colonel, NP sent at 02/12/2020  7:39 AM EDT ----- Sent note in [place].  Please make sure he has a Rx of metolazone 5 mg to take for emergencies of extensive fluid overload. Give him #5. Not to take unless he calls [place] first.  We are reducing his torsemide to 40 mg in the am and 20 mg in the pm.  Can take extra 20 mg again if edema worsens. Please repeat his BMET in one month,

## 2020-02-20 NOTE — Telephone Encounter (Signed)
Pt aware of his blood work, metolazone 5 mg send into pharmacy, BMP ordered and pt stated he will come tomorrow of Tuesday 06/02 to get blood work done, pt have appt with Dr Ellyn Hack 06/03

## 2020-02-21 DIAGNOSIS — Z79899 Other long term (current) drug therapy: Secondary | ICD-10-CM | POA: Diagnosis not present

## 2020-02-21 LAB — BASIC METABOLIC PANEL
BUN/Creatinine Ratio: 19 (ref 10–24)
BUN: 31 mg/dL — ABNORMAL HIGH (ref 8–27)
CO2: 24 mmol/L (ref 20–29)
Calcium: 9.1 mg/dL (ref 8.6–10.2)
Chloride: 101 mmol/L (ref 96–106)
Creatinine, Ser: 1.64 mg/dL — ABNORMAL HIGH (ref 0.76–1.27)
GFR calc Af Amer: 45 mL/min/{1.73_m2} — ABNORMAL LOW (ref >59)
GFR calc non Af Amer: 39 mL/min/{1.73_m2} — ABNORMAL LOW (ref >59)
Glucose: 88 mg/dL (ref 65–99)
Potassium: 4.1 mmol/L (ref 3.5–5.2)
Sodium: 140 mmol/L (ref 134–144)

## 2020-02-21 NOTE — Procedures (Signed)
   NAME: Patrick Bishop DATE OF BIRTH:  [birth year] MEDICAL RECORD NUMBER [id number]  [facility]  PHYSICIAN: Marius Ditch  DATE OF STUDY: 02/13/2020  SLEEP STUDY TYPE: Nocturnal Polysomnogram               REFERRING PHYSICIAN: Marius Ditch, MD  INDICATION FOR STUDY: Inadequate control of airway with auto-adjusting CPAP  EPWORTH SLEEPINESS SCORE:   HEIGHT: 6\' 1"  (185.4 cm)  WEIGHT: (!) 342 lb (155.1 kg)    Body mass index is 45.12 kg/m.  NECK SIZE: 20 in.  MEDICATIONS Patient self administered medications include: N/A. Medications administered during study include No sleep medicine administered.[name]  SLEEP STUDY TECHNIQUE The patient underwent an attended overnight polysomnography titration to assess the effects of cpap therapy. The following variables were monitored: EEG(C4-A1, C3-A2, O1-A2, O2-A1), EOG, submental and leg EMG, ECG, oxyhemoglobin saturation by pulse oximetry, thoracic and abdominal respiratory effort belts, nasal/oral airflow by pressure sensor, body position sensor and snoring sensor. CPAP pressure was titrated to eliminate apneas, hypopneas and oxygen desaturation.  TECHNICAL COMMENTS Comments added by Technician: Patient had difficulty initiating sleep. PATIENT HAD DIFFICULTY MAINTAINING SLEEP. Comments added by Scorer: N/A  SLEEP ARCHITECTURE The study was initiated at 10:20:16 PM and terminated at 4:49:33 AM. Total recorded time was 389.3 minutes. EEG confirmed total sleep time was 256.5 minutes yielding a sleep efficiency of 65.9%%. Sleep onset after lights out was 2.5 minutes with a REM latency of 283.5 minutes. The patient spent 14.8%% of the night in stage N1 sleep, 77.2%% in stage N2 sleep, 0.0%% in stage N3 and 8% in REM. The Arousal Index was 2.3/hour.  RESPIRATORY PARAMETERS The overall AHI was 42.6 per hour, and the RDI was 42.6 events/hour with a central apnea index of 0.0per hour. The patient was titrated to a CPAP  of 14 cm H2O. At this pressure, apneas and hypoxemia were resolved but hypopneas were not.   LEG MOVEMENT DATA The total leg movements were 0 with a resulting leg movement index of 0.0/hr. Associated arousal with leg movement index was 0.0/hr.  CARDIAC DATA The underlying cardiac rhythm was most consistent with sinus rhythm. Mean heart rate during sleep was 57.9 bpm. Additional rhythm abnormalities include Atrial Fibrillation.  IMPRESSIONS - Inadequate CPAP titration to 14 cm water pressure  DIAGNOSIS - Obstructive Sleep Apnea (327.23 [G47.33 ICD-10])  RECOMMENDATIONS - His OSA was not adequately controlled on CPAP therapy 14 cm H2O and there was not time for further titration. This pressure is higher than the pressure his auto-adjusting machine was going to. Options at this point include setting his auto adjusting CPAP at a higher pressure or repeating the PAP titration in the lab.   Marius Ditch Sleep specialist, [place] Board of Internal Medicine  ELECTRONICALLY SIGNED ON:  02/21/2020, 8:28 AM [place] PH: [phone]   FX: [phone] [place]

## 2020-02-27 ENCOUNTER — Other Ambulatory Visit: Payer: Self-pay | Admitting: Cardiology

## 2020-02-27 ENCOUNTER — Ambulatory Visit (INDEPENDENT_AMBULATORY_CARE_PROVIDER_SITE_OTHER): Payer: Medicare Other | Admitting: Cardiology

## 2020-02-27 ENCOUNTER — Encounter: Payer: Self-pay | Admitting: Cardiology

## 2020-02-27 ENCOUNTER — Other Ambulatory Visit: Payer: Self-pay

## 2020-02-27 VITALS — BP 96/52 | HR 69 | Ht 73.0 in | Wt 338.6 lb

## 2020-02-27 DIAGNOSIS — I10 Essential (primary) hypertension: Secondary | ICD-10-CM | POA: Diagnosis not present

## 2020-02-27 DIAGNOSIS — E785 Hyperlipidemia, unspecified: Secondary | ICD-10-CM | POA: Diagnosis not present

## 2020-02-27 DIAGNOSIS — I272 Pulmonary hypertension, unspecified: Secondary | ICD-10-CM | POA: Diagnosis not present

## 2020-02-27 DIAGNOSIS — I871 Compression of vein: Secondary | ICD-10-CM | POA: Diagnosis not present

## 2020-02-27 DIAGNOSIS — I5032 Chronic diastolic (congestive) heart failure: Secondary | ICD-10-CM

## 2020-02-27 DIAGNOSIS — G4733 Obstructive sleep apnea (adult) (pediatric): Secondary | ICD-10-CM

## 2020-02-27 DIAGNOSIS — I872 Venous insufficiency (chronic) (peripheral): Secondary | ICD-10-CM

## 2020-02-27 DIAGNOSIS — Z9989 Dependence on other enabling machines and devices: Secondary | ICD-10-CM | POA: Diagnosis not present

## 2020-02-27 DIAGNOSIS — I4821 Permanent atrial fibrillation: Secondary | ICD-10-CM | POA: Diagnosis not present

## 2020-02-27 MED ORDER — METOLAZONE 5 MG PO TABS: ORAL_TABLET | ORAL | 3 refills | Status: DC

## 2020-02-27 MED ORDER — POTASSIUM CHLORIDE CRYS ER 20 MEQ PO TBCR: 20.0000 meq | EXTENDED_RELEASE_TABLET | Freq: Two times a day (BID) | ORAL | 3 refills | Status: DC

## 2020-02-27 MED ORDER — AMLODIPINE BESYLATE 5 MG PO TABS: 5.0000 mg | ORAL_TABLET | Freq: Every day | ORAL | 3 refills | Status: DC

## 2020-02-27 NOTE — Patient Instructions (Signed)
Medication Instructions:  Decrease  Amlodipine to 5 mg daily If blood pressure is less than 184 systolic or feeling dizzy do mto take pedication that evening.  Use Metolazone as needed if your weight is 3 lbs heavier then your dry weight.   *If you need a refill on your cardiac medications before your next appointment, please call your pharmacy*   Lab Work: Not needed   Testing/Procedures: Not needed  Follow-Up: At [facility], you and your health needs are our priority.  As part of our continuing mission to provide you with exceptional heart care, we have created designated Provider Care Teams.  These Care Teams include your primary Cardiologist (physician) and Advanced Practice Providers (APPs -  Physician Assistants and Nurse Practitioners) who all work together to provide you with the care you need, when you need it.    Your next appointment:   3 month(s)  The format for your next appointment:   In Person  Provider:   Glenetta Hew, MD  Other instructions  Try and use your ace wraps for your leg- swelling- daily

## 2020-02-27 NOTE — Progress Notes (Signed)
Primary Care Provider: Lujean Amel, MD Cardiologist: Glenetta Hew, MD Electrophysiologist: None  Clinic Note: Chief Complaint  Patient presents with  . Follow-up    6-week  . Edema  . Atrial Fibrillation   HPI:    Patrick Bishop is a 79 y.o. male with a PMH notable for chronic lymphedema, longstanding persistent A. fib (permanent), with elevated pulmonary pressures reviewed below who presents today for 6-week follow-up for lower extremity edema (thought to be related to chronic lymphedema).  Patrick Bishop was last seen on January 20, 2020 by [name], NP as of 1 week follow-up.  At that time he lost 26 pounds and noticed some bruising.  Continue to have some lotion edema but less prominent.  Breathing was about the same however.  They remove salty foods from diet.-If this was a response to change from Lasix to torsemide 40 mg twice daily with 2 days of metolazone.  He was recently seen by Dr. Vaughan Browner on May 26 as a follow-up from his COVID-19 infection. -> He noted some occasional exertional dyspnea, but overall pretty improved.  Not requiring supplemental oxygen. -> Sleep study ordered by PCP on May 24, not read.  PFTs 12/18/2019: FVC 3.64 [79%], FEV1 2.71 [82%], F/F 74, TLC 5.56 [72%], DLCO 22.11 [82%] Mild restriction  Recent Hospitalizations:   He was admitted from November 6-13 for pneumonia pneumonia related to COVID-19--treated with remdesivir and steroids.  He was also noted to have left hydronephrosis with a new JP stone treated with left nephrostomy tube.  Reviewed  CV studies:    The following studies were reviewed today: (if available, images/films reviewed: From Epic Chart or Care Everywhere) . none:  Interval History:   Patrick Bishop is here today for follow-up indicating he is no longer eating lots of salty foods.  Weight has definitely improved, and stable from last visit.  He now has been taking his torsemide 80 mg twice daily and has only used  occasional metolazone.  Overall he is sleeping better but still sleeps in a recliner.  This is more for comfort everything else.  He denies any PND symptoms, and it is difficult assess if he has true orthopnea.  He has chronic lower extremity edema which is basically lymphedema.  The patient denies any active whatsoever irregular heartbeats with no clubbing/skipping.  Denies any chest pain or pressure with rest or exertion, although is relatively inactive.  CV Review of Symptoms (Summary) Cardiovascular ROS: positive for - dyspnea on exertion, edema and orthopnea negative for - chest pain, irregular heartbeat, palpitations, paroxysmal nocturnal dyspnea, rapid heart rate, shortness of breath or syncope/near syncope. TIA/ amaurosis fugax  The patient does not have symptoms concerning for COVID-19 infection (fever, chills, cough, or new shortness of breath).  The patient is practicing social distancing & Masking.    REVIEWED OF SYSTEMS   Review of Systems  Constitutional: Positive for malaise/fatigue and weight loss (Stable now over the last couple months).  HENT: Negative for congestion and nosebleeds.   Respiratory: Positive for shortness of breath (Notably improved since diuresis.).   Cardiovascular: Positive for orthopnea and leg swelling.  Gastrointestinal: Negative for abdominal pain, blood in stool, constipation and melena.  Genitourinary: Negative for hematuria.  Musculoskeletal: Positive for joint pain. Negative for falls.  Neurological: Positive for focal weakness (Legs are generally weak.). Negative for dizziness and headaches.  Psychiatric/Behavioral: Negative.    I have reviewed and (if needed) personally updated the patient's problem list, medications, allergies, past medical and surgical  history, social and family history.   PAST MEDICAL HISTORY   Past Medical History:  Diagnosis Date  . Arthritis   . CHF (congestive heart failure) ([place])   . Chronic diastolic heart  failure (HCC)    Normal LV Fxn by Echo 04/2017   . Chronic venous insufficiency 2015   s/p Bilateral Iliac & Femoral Stents (including IVC) - DEEP Venous Reflux.  Also s/p Bilateral GSV Ablation.  [name] Dyspnea   . Dysrhythmia    a fib   . Essential hypertension   . History of kidney stones   . Hx of adenomatous colonic polyps   . Hyperlipidemia with target LDL less than 100   . Longstanding persistent atrial fibrillation ([place])   . Nephrolithiasis 05/2019  . Obesity   . OSA on CPAP    May need New CPAP machine & CPAP MD)  . Pneumonia   . Pre-diabetes   . Pulmonary hypertension ([place])   . Pyelonephritis 05/2019  . Venous insufficiency    LE    PAST SURGICAL HISTORY   Past Surgical History:  Procedure Laterality Date  . ABDOMINAL VENOUS DUPLEX Bilateral 07/2014   ([place]: Dr. Cleda Mccreedy): (prior to Bilateral Iliac-Femoral Venous Stent Placement): Patent IVC.  No DVT B/L Iliac or Com fem V..  +++ DEEP V REFLUX B/L ILIAC & FEM V.  Absent Superficial V Reflux B/L x L SSV (too tortuous for intervention).  BL GSV occluded - prior ablation.   . ABDOMINAL VENOUS DUPLEX Bilateral 11/2014   Absent DVT.  Left common femoral venous reflux: 1508 MS left common femoral vein, 2284MS left proximal femoral vein -> cystic structure noted in the left popliteal vein (7.6 x 1.4 x 3.8 cm).;  Right leg deep venous reflux noted in the distal iliac, common femoral, proximal profunda femoral, proximal superficial femoral, superficial femoral and popliteal vein.  [name] CARDIAC CATHETERIZATION  10/2009   [facility] - Dr. 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  EF~45%.  Mild pulmonary pretension.  [name] CARDIAC PET  08/2009   Inferolateral partially reversible defect -- > false positive by cath  . COLONOSCOPY    . CYSTOSCOPY/URETEROSCOPY/HOLMIUM LASER/STENT PLACEMENT Left 11/06/2019   Procedure: CYSTOSCOPY/RETROGRADE/URETEROSCOPY/HOLMIUM LASER/STENT  PLACEMENT. REMOVAL NEPHROSCOPYTUBE;  Surgeon: Ceasar Mons, MD;  Location: [facility];  Service: Urology;  Laterality: Left;  . ESOPHAGOGASTRODUODENOSCOPY    . GREATER SAPHENOUS VEIN ABLATION Bilateral Before 2015   [facility] (Dr. Eugenie Filler)  . IR NEPHROSTOMY EXCHANGE LEFT  08/28/2019  . IR NEPHROSTOMY PLACEMENT LEFT  06/24/2019  . IR TRANSCATH PLC STENT  EA ADD VEIN  INC ANGIOPLASTY Left 10/2014   [facility], Dr. 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 IR TRANSCATH PLC STENT  INITIAL VEIN  INC ANGIOPLASTY Right 08/2014   [facility] - Dr. Stormy Fabian): US Guided --> R Common & External Iliac, Common Femoral Venography with Placement of 24 mmx 70 mm Stent - R Com Iliac-Ext Iliac- & Com Fem V. reducing 60-80% venous compression to ~0%).;; also noted significant L-sided venous compression (staged stenting).   [name] LIPOMA EXCISION     x 2 came back again  . NM MYOVIEW LTD  11/2005   Adenosine Myoview [facility] -  Dr. Cleda Mccreedy): Normal myocardial perfusion scan. No ischemia or infarction.  . TRANSTHORACIC ECHOCARDIOGRAM  04/26/2017    [facility] - Dr. Cleda Mccreedy) - report not available.  Per clinic note: Normal global LV function. Mild concentric temperature. Mild TR. Mild pulmonary hypertension.;;   . TRANSTHORACIC ECHOCARDIOGRAM  7/'15; 7/'17   [facility] - Dr. Saverio Danker) a) low normal LV function (EF 56%). Moderate concentric LVH. Mild LA dilation. Normal pulmonary pressures.  ;; b) normal global function - EF 60-65%. Moderate pulmonary hypertension. Biatrial enlargement. Moderate concentric LVH.  [name] TRANSTHORACIC ECHOCARDIOGRAM  02/2010   [facility] - Dr. Saverio Danker): Normal global LV function. Mild LVH. EF 60%.; November 2010. Moderate LV  dilation. Moderate LVH. Normal EF 50-55%. Mild to moderate pulmonary hypertension. (July 2012 showed normal function with mild LVH, mild MR noted in August 2014)  . TRANSTHORACIC ECHOCARDIOGRAM  05/2019   During admission for UTI, sepsis with A. fib RVR: LV normal size and function.  EF 60-65%.  Mildly reduced RV size with severely reduced function.  Despite this normal atrial size noted with just only mild right atrial enlargement with elevated RAP (dilated IVC).  Relatively poor TR jet, but findings do suggest elevated RV pressures.    MEDICATIONS/ALLERGIES   Current Meds  Medication Sig  . atorvastatin (LIPITOR) 10 MG tablet Take 1 tablet (10 mg total) by mouth daily. (Patient taking differently: Take 10 mg by mouth daily at 6 PM. )  . Buprenorphine HCl (BELBUCA) 300 MCG FILM Place 1 Film inside cheek in the morning and at bedtime.  . Buprenorphine HCl 300 MCG FILM Place 300 mcg inside cheek 2 (two) times daily.  [name] HYDROcodone-acetaminophen (NORCO/VICODIN) 5-325 MG tablet Take 1 tablet by mouth 2 (two) times daily as needed for moderate pain.  . magnesium oxide (MAG-OX) 400 (241.3 Mg) MG tablet Take 1 tablet (400 mg total) by mouth daily.  . metolazone ([name]) 5 MG tablet Take 5 mg tablet daily as need for weight gain greater than 3 lbs  . metoprolol tartrate (LOPRESSOR) 25 MG tablet TAKE 1 TABLET IN THE MORNING AND ONE AND ONE-HALF TABLETS (37.5 MG) IN THE EVENING  . potassium chloride SA (KLOR-CON) 20 MEQ tablet Take 1 tablet (20 mEq total) by mouth 2 (two) times daily.  [name] torsemide (DEMADEX) 20 MG tablet Take 2 tablets (40 mg total) by mouth 2 (two) times daily.  Alveda Reasons 20 MG TABS tablet TAKE 1 TABLET DAILY WITH SUPPER (Patient taking differently: Take 20 mg by mouth daily with supper. TAKE 1 TABLET DAILY WITH SUPPER)  . [DISCONTINUED] amLODipine (NORVASC) 10 MG tablet Take 10 mg by mouth daily with supper.   . [DISCONTINUED] metolazone ([name]) 5 MG tablet Take 1 tablet (5 mg  total) by mouth daily.  . [DISCONTINUED] potassium chloride SA (KLOR-CON) 20 MEQ tablet Take 1 tablet (20 mEq total) by mouth daily. (Patient taking differently: Take 20 mEq by mouth 2 (two) times daily. )    Allergies  Allergen Reactions  . Aldactone [Spironolactone] Other (See Comments)    unknown     SOCIAL HISTORY/FAMILY HISTORY   Reviewed in Epic:  Pertinent findings: no changes  OBJCTIVE -PE, EKG, labs   Wt Readings from Last 3 Encounters:  02/27/20 (!) 338 lb 9.6 oz (153.6 kg)  02/19/20 (!) 341 lb (154.7 kg)  02/13/20 (!) 342 lb (155.1 kg)    Physical Exam: BP (!) 96/52   Pulse 69   Ht 6\' 1"  (1.854 m)   Wt [name])  338 lb 9.6 oz (153.6 kg)   BMI 44.67 kg/m  Physical Exam  Constitutional: He is oriented to person, place, and time. He appears well-nourished. No distress.  Morbidly obese, chronically ill-appearing.  Nontoxic.  Rolling walker.  HENT:  Head: Normocephalic and atraumatic.  Neck: JVD (Unable to assess due to body habitus) present. No hepatojugular reflux present. Carotid bruit is not present.  Cardiovascular: Normal rate, S1 normal and S2 normal. An irregularly irregular rhythm present. PMI is not displaced. Exam reveals distant heart sounds and decreased pulses (Unable to palpate due to edema.). Exam reveals no gallop and no friction rub.  No murmur heard. Pulmonary/Chest: Effort normal. He exhibits no tenderness.  Distant breath sounds but no obvious W/R/R  Musculoskeletal:        General: Edema (3-4+ bilateral LE for knees down.) present. Normal range of motion.     Cervical back: Normal range of motion and neck supple.  Neurological: He is alert and oriented to person, place, and time.  Skin: He is not diaphoretic.  Vitals reviewed.   Adult ECG Report  Rate: 69 ;  Rhythm: atrial flutter and Low-voltage.  Nonspecific ST-T wave changes.  No acute changes.;   Narrative Interpretation: Stable  Recent Labs:    Lipids problem Jan 27, 2020: TC 108, TG 87,  HDL 31, LDL 59. Lab Results  Component Value Date   CREATININE 1.64 (H) 02/21/2020   BUN 31 (H) 02/21/2020   NA 140 02/21/2020   K 4.1 02/21/2020   CL 101 02/21/2020   CO2 24 02/21/2020   No results found for: TSH  ASSESSMENT/PLAN    Problem List Items Addressed This Visit    Permanent atrial fibrillation (HCC) (Chronic)    CHA2DS2-VASc score 4.  On Xarelto. Rate control with moderate dose Lopressor  With no plans for rhythm control, is now considered permanent A. fib.      Relevant Medications   amLODipine (NORVASC) 5 MG tablet   metolazone ([name]) 5 MG tablet   Morbid obesity (HCC) (Chronic)    Weights are now staying stable.  A lot of weight is related to edema.  Continue discuss dietary modification to avoid overeating.      Chronic diastolic HF (heart failure) (HCC) - Primary (Chronic)   Relevant Medications   amLODipine (NORVASC) 5 MG tablet   metolazone ([name]) 5 MG tablet   Other Relevant Orders   EKG 12-Lead (Completed)   Chronic venous insufficiency (Chronic)    Superficial and deep venous insufficiency as noted elsewhere.  Recommend support stockings or wraps if possible.   Foot elevation despite sleeping in a recliner.      Relevant Medications   amLODipine (NORVASC) 5 MG tablet   metolazone ([name]) 5 MG tablet   Other Relevant Orders   EKG 12-Lead (Completed)   Essential hypertension (Chronic)    Hypertensive.  We are actually backing down amlodipine to 5 mg because of hypotension.      Relevant Medications   amLODipine (NORVASC) 5 MG tablet   metolazone ([name]) 5 MG tablet   OSA on CPAP (Chronic)    Has not a sleep study evaluated.  Need to make sure that he is actually on treatment for OSA as this may well help his pulmonary hypertension.      Pulmonary hypertension (HCC) (Chronic)    Interestingly, there was evidence of dilated RV and increased right atrial pressures.  This is probably related to obesity, OSA/OHS however  there is a component of A.  fib.  It is hard to blame this on A. fib because he does not have significant evidence of diastolic dysfunction.  He is on amlodipine which will reduce to 5 mg because of hypotension.  Has sleep study evaluation I suspect that he may benefit from lifetime oxygen.      Relevant Medications   amLODipine (NORVASC) 5 MG tablet   metolazone ([name]) 5 MG tablet   Hyperlipidemia with target LDL less than 100 (Chronic)    On statin.  Followed by PCP.  Labs were at goal.      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      Relevant Medications   amLODipine (NORVASC) 5 MG tablet   metolazone ([name]) 5 MG tablet      COVID-19 Education: The signs and symptoms of COVID-19 were discussed with the patient and how to seek care for testing (follow up with PCP or arrange E-visit).   The importance of social distancing and COVID-19 vaccination was discussed today.   I spent a total of 70minutes with the patient. >  50% of the time was spent in direct patient consultation.  Additional time spent with chart review  / charting (studies, outside notes, etc): 8 Total Time: 32 min   Current medicines are reviewed at length with the patient today.  (+/- concerns) none  Notice: This dictation was prepared with Dragon dictation along with smaller phrase technology. Any transcriptional errors that result from this process are unintentional and may not be corrected upon review.  Patient Instructions / Medication Changes & Studies & Tests Ordered   Patient Instructions  Medication Instructions:  Decrease  Amlodipine to 5 mg  daily If blood pressure is less than 878 systolic or feeling dizzy do mto take pedication that evening.  Use Metolazone as needed if your weight is 3 lbs heavier then your dry weight.   *If you need a refill on your cardiac medications before your next appointment, please call your pharmacy*   Lab Work: Not needed   Testing/Procedures: Not needed  Follow-Up: At [facility], you and your health needs are our priority.  As part of our continuing mission to provide you with exceptional heart care, we have created designated Provider Care Teams.  These Care Teams include your primary Cardiologist (physician) and Advanced Practice Providers (APPs -  Physician Assistants and Nurse Practitioners) who all work together to provide you with the care you need, when you need it.    Your next appointment:   3 month(s)  The format for your next appointment:   In Person  Provider:   Glenetta Hew, MD  Other instructions  Try and use your ace wraps for your leg- swelling- daily     Studies Ordered:   Orders Placed This Encounter  Procedures  . EKG 12-Lead     Glenetta Hew, M.D., M.S. Interventional Cardiologist   Pager # [phone] Phone # [phone] [address] [place], [place] [address]   Thank you for choosing Heartcare at [facility]!!

## 2020-03-03 ENCOUNTER — Telehealth: Payer: Self-pay | Admitting: Adult Health

## 2020-03-03 ENCOUNTER — Encounter: Payer: Self-pay | Admitting: Cardiology

## 2020-03-03 NOTE — Assessment & Plan Note (Signed)
CHA2DS2-VASc score 4.  On Xarelto. Rate control with moderate dose Lopressor  With no plans for rhythm control, is now considered permanent A. fib.

## 2020-03-03 NOTE — Assessment & Plan Note (Signed)
Hypertensive.  We are actually backing down amlodipine to 5 mg because of hypotension.

## 2020-03-03 NOTE — Assessment & Plan Note (Signed)
Superficial and deep venous insufficiency as noted elsewhere.  Recommend support stockings or wraps if possible.   Foot elevation despite sleeping in a recliner.

## 2020-03-03 NOTE — Assessment & Plan Note (Signed)
On statin.  Followed by PCP.  Labs were at goal.

## 2020-03-03 NOTE — Telephone Encounter (Signed)
Patient states his sleeping machine had adjustments made but it's not working.

## 2020-03-03 NOTE — Assessment & Plan Note (Signed)
Interestingly, there was evidence of dilated RV and increased right atrial pressures.  This is probably related to obesity, OSA/OHS however there is a component of A. fib.  It is hard to blame this on A. fib because he does not have significant evidence of diastolic dysfunction.  He is on amlodipine which will reduce to 5 mg because of hypotension.  Has sleep study evaluation I suspect that he may benefit from lifetime oxygen.

## 2020-03-03 NOTE — Assessment & Plan Note (Signed)
Has not a sleep study evaluated.  Need to make sure that he is actually on treatment for OSA as this may well help his pulmonary hypertension.

## 2020-03-03 NOTE — Assessment & Plan Note (Signed)
She has significant bilateral venous disease making his edema both significant venous stasis and as needed metolazone.,  Plan: Again reiterated use of metolazone increased leg pain more than 3 pounds while on Demadex.

## 2020-03-03 NOTE — Assessment & Plan Note (Signed)
Weights are now staying stable.  A lot of weight is related to edema.  Continue discuss dietary modification to avoid overeating.

## 2020-03-04 NOTE — Telephone Encounter (Signed)
Patient calling back.   °

## 2020-03-04 NOTE — Telephone Encounter (Signed)
Left message for patient to contact the company that supplies the machine If the problem is machine malfunction. Otherwise the patient will call back with issues.

## 2020-05-07 DIAGNOSIS — I890 Lymphedema, not elsewhere classified: Secondary | ICD-10-CM | POA: Diagnosis not present

## 2020-05-07 DIAGNOSIS — N529 Male erectile dysfunction, unspecified: Secondary | ICD-10-CM | POA: Diagnosis not present

## 2020-05-12 DIAGNOSIS — M1710 Unilateral primary osteoarthritis, unspecified knee: Secondary | ICD-10-CM | POA: Diagnosis not present

## 2020-05-12 DIAGNOSIS — M169 Osteoarthritis of hip, unspecified: Secondary | ICD-10-CM | POA: Diagnosis not present

## 2020-05-12 DIAGNOSIS — G894 Chronic pain syndrome: Secondary | ICD-10-CM | POA: Diagnosis not present

## 2020-05-12 DIAGNOSIS — I739 Peripheral vascular disease, unspecified: Secondary | ICD-10-CM | POA: Diagnosis not present

## 2020-05-21 ENCOUNTER — Ambulatory Visit
Admission: RE | Admit: 2020-05-21 | Discharge: 2020-05-21 | Disposition: A | Discharge status: Home / Self Care | Payer: Medicare Other | Source: Ambulatory Visit | Attending: Pulmonary Disease | Admitting: Pulmonary Disease

## 2020-05-21 DIAGNOSIS — I2510 Atherosclerotic heart disease of native coronary artery without angina pectoris: Secondary | ICD-10-CM | POA: Diagnosis not present

## 2020-05-21 DIAGNOSIS — I700 Atherosclerosis of aorta: Secondary | ICD-10-CM | POA: Diagnosis not present

## 2020-05-21 DIAGNOSIS — U071 COVID-19: Secondary | ICD-10-CM

## 2020-05-21 DIAGNOSIS — J1282 Pneumonia due to coronavirus disease 2019: Secondary | ICD-10-CM

## 2020-05-21 DIAGNOSIS — R0600 Dyspnea, unspecified: Secondary | ICD-10-CM

## 2020-05-21 DIAGNOSIS — J984 Other disorders of lung: Secondary | ICD-10-CM | POA: Diagnosis not present

## 2020-05-21 DIAGNOSIS — I517 Cardiomegaly: Secondary | ICD-10-CM | POA: Diagnosis not present

## 2020-05-26 ENCOUNTER — Ambulatory Visit: Payer: Medicare Other | Admitting: Pulmonary Disease

## 2020-05-29 ENCOUNTER — Encounter: Payer: Self-pay | Admitting: Cardiology

## 2020-05-29 ENCOUNTER — Other Ambulatory Visit: Payer: Self-pay

## 2020-05-29 ENCOUNTER — Ambulatory Visit (INDEPENDENT_AMBULATORY_CARE_PROVIDER_SITE_OTHER): Payer: Medicare Other | Admitting: Cardiology

## 2020-05-29 VITALS — BP 114/70 | HR 68 | Ht 73.0 in | Wt 328.0 lb

## 2020-05-29 DIAGNOSIS — Z9989 Dependence on other enabling machines and devices: Secondary | ICD-10-CM

## 2020-05-29 DIAGNOSIS — I5032 Chronic diastolic (congestive) heart failure: Secondary | ICD-10-CM

## 2020-05-29 DIAGNOSIS — I10 Essential (primary) hypertension: Secondary | ICD-10-CM | POA: Diagnosis not present

## 2020-05-29 DIAGNOSIS — I4821 Permanent atrial fibrillation: Secondary | ICD-10-CM | POA: Diagnosis not present

## 2020-05-29 DIAGNOSIS — I872 Venous insufficiency (chronic) (peripheral): Secondary | ICD-10-CM | POA: Diagnosis not present

## 2020-05-29 DIAGNOSIS — I272 Pulmonary hypertension, unspecified: Secondary | ICD-10-CM | POA: Diagnosis not present

## 2020-05-29 DIAGNOSIS — G4733 Obstructive sleep apnea (adult) (pediatric): Secondary | ICD-10-CM

## 2020-05-29 NOTE — Progress Notes (Signed)
Primary Care Provider: Lujean Amel, MD Cardiologist: Glenetta Hew, MD Electrophysiologist: None  Clinic Note: Chief Complaint  Patient presents with  . Follow-up    91-month  . Leg Swelling    Stable if not improved   HPI:    Patrick Bishop is a 79 y.o. male with a PMH notable for CHRONIC LYMPHEDEMA, LONGSTANDING PERSISTENT/PAROXYSMAL A. FIB, elevated PAP who presents here for 22-month follow-up.   November 6 04/08/2019 pneumonia related to COVID-19 (steroids and remdesivir)-also left hydronephrosis with new JP stone-left nephrostomy tube -> cystoscopy ureteroscopic February 2021 to remove nephrostomy tube.  He was seen on January 20, 2020 by Patrick Sims, NP as of 1 week follow-up.  At that time he lost 26 pounds and noticed some bruising.  Continue to have some lotion edema but less prominent.  Breathing was about the same however.  They remove salty foods from diet.-If this was a response to change from Lasix to Torsemide 40 mg twice daily with 2 days of metolazone.  02/17/2020: Sleep Study through [facility] -> report not available.  Patrick Bishop was last seen on February 27, 2020 -> no longer eating salty foods weight was definitely improved.  Taking 80 mg twice daily torsemide with occasional metolazone.  Sleeping better but still on recliner more for comfort.  Chronic edema, but stable if not better.  we reduced amlodipine to 5mg ; (don't take if dizzy or SBP < 110 mmHg)  PRN metolazone for wgt > 3 lb  Recent Hospitalizations:   None  Reviewed  CV studies:    The following studies were reviewed today: (if available, images/films reviewed: From Epic Chart or Care Everywhere) . none:  Interval History:   Patrick Bishop is here today stating that he is actually doing pretty well.  He still doing well with his weight.  He still has occasional episodes where he feels dizzy most notably when his blood pressure is low.  He has held amlodipine on a couple  occasions but not frequently.  He says he still somewhat fatigued and tired, not having completely recovered yet from Covid -> still notes some exertional dyspnea but definitely improved. He is down to 40 mg twice daily torsemide-taking [name] maybe 2 days a week.  Few weeks ago his PCP had him take his [name] for 3 days because worsening swelling, but for the most part this is not that often.  He is try to avoid salty foods.  Still sleeping on his recliner making it hard to determine if he truly has PND orthopnea.  He has to sleep in the recliner to help him get up and down because of frequent nocturia.  Major issue he notes now is that he is having a hard time with his CPAP.  He is concerned because the settings seem to be too high.  Since he is blowing his face off, and they are reluctant to reduce the settings.  Despite being in and out of A. fib, he denies any irregular symptoms of palpitations.  No tachycardia.  CV Review of Symptoms (Summary): positive for - Stable if not improved DOE.  He does have orthopnea but exacerbated by OSA with difficult to assess PND.  Edema is stable. ->  Dizziness and lightheadedness if his blood pressure gets too low. negative for - chest pain, irregular heartbeat, palpitations, paroxysmal nocturnal dyspnea, rapid heart rate, shortness of breath or syncope/near syncope. TIA/ amaurosis fugax  The patient does not have symptoms concerning for COVID-19 infection (fever,  chills, cough, or new shortness of breath).  The patient is practicing social distancing & Masking.    REVIEWED OF SYSTEMS   Review of Systems  Constitutional: Positive for malaise/fatigue (He thinks that he can sleep better-given the CPAP figured out, he would be less tired). Negative for weight loss (Maintaining stable weights.  He is actually dropped about 10 pounds in the last saw him in maintaining it that way.).  HENT: Negative for congestion and nosebleeds.   Respiratory: Positive for  shortness of breath (Notably improved since diuresis.).   Cardiovascular: Positive for orthopnea and leg swelling (Stable.  Usually wears support stockings).  Gastrointestinal: Negative for abdominal pain, blood in stool, constipation and melena.  Genitourinary: Negative for hematuria.  Musculoskeletal: Positive for joint pain. Negative for falls.  Neurological: Positive for focal weakness (Legs are generally weak.). Negative for dizziness and headaches.  Psychiatric/Behavioral: Negative for depression and memory loss. The patient has insomnia (Not really insomnia, more difficulties now with his CPAP machine). The patient is not nervous/anxious.    I have reviewed and (if needed) personally updated the patient's problem list, medications, allergies, past medical and surgical history, social and family history.   PAST MEDICAL HISTORY   Past Medical History:  Diagnosis Date  . Arthritis   . CHF (congestive heart failure) ([place])   . Chronic diastolic heart failure (HCC)    Normal LV Fxn by Echo 04/2017   . Chronic venous insufficiency 2015   s/p Bilateral Iliac & Femoral Stents (including IVC) - DEEP Venous Reflux.  Also s/p Bilateral GSV Ablation.  [name] Dyspnea   . Dysrhythmia    a fib   . Essential hypertension   . History of kidney stones   . Hx of adenomatous colonic polyps   . Hyperlipidemia with target LDL less than 100   . Longstanding persistent atrial fibrillation ([place])   . Nephrolithiasis 05/2019  . Obesity   . OSA on CPAP    May need New CPAP machine & CPAP MD)  . Pneumonia   . Pre-diabetes   . Pulmonary hypertension ([place])   . Pyelonephritis 05/2019  . Venous insufficiency    LE    PAST SURGICAL HISTORY   Past Surgical History:  Procedure Laterality Date  . ABDOMINAL VENOUS DUPLEX Bilateral 07/2014   ([place]. Cardiology: Dr. Cleda Mccreedy): (prior to Bilateral Iliac-Femoral Venous Stent Placement): Patent IVC.  No DVT B/L Iliac or Com fem V..  +++ DEEP V REFLUX B/L ILIAC & FEM  V.  Absent Superficial V Reflux B/L x L SSV (too tortuous for intervention).  BL GSV occluded - prior ablation.   . ABDOMINAL VENOUS DUPLEX Bilateral 11/2014   Absent DVT.  Left common femoral venous reflux: 1508 MS left common femoral vein, 2284MS left proximal femoral vein -> cystic structure noted in the left popliteal vein (7.6 x 1.4 x 3.8 cm).;  Right leg deep venous reflux noted in the distal iliac, common femoral, proximal profunda femoral, proximal superficial femoral, superficial femoral and popliteal vein.  [name] CARDIAC CATHETERIZATION  10/2009   [facility] - Dr. 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  EF~45%.  Mild pulmonary pretension.  [name] CARDIAC PET  08/2009   Inferolateral partially reversible defect -- > false positive by cath  . COLONOSCOPY    . CYSTOSCOPY/URETEROSCOPY/HOLMIUM LASER/STENT PLACEMENT Left 11/06/2019   Procedure: CYSTOSCOPY/RETROGRADE/URETEROSCOPY/HOLMIUM LASER/STENT PLACEMENT. REMOVAL NEPHROSCOPYTUBE;  Surgeon: Ceasar Mons, MD;  Location: [facility];  Service:  Urology;  Laterality: Left;  . ESOPHAGOGASTRODUODENOSCOPY    . GREATER SAPHENOUS VEIN ABLATION Bilateral Before 2015   [facility] (Dr. Eugenie Filler)  . IR NEPHROSTOMY EXCHANGE LEFT  08/28/2019  . IR NEPHROSTOMY PLACEMENT LEFT  06/24/2019  . IR TRANSCATH PLC STENT  EA ADD VEIN  INC ANGIOPLASTY Left 10/2014   [facility], Dr. 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 IR TRANSCATH PLC STENT  INITIAL VEIN  INC ANGIOPLASTY Right 08/2014   [facility] - Dr. Stormy Fabian): US Guided --> R Common & External Iliac, Common Femoral Venography with Placement of 24 mmx 70 mm Stent - R Com Iliac-Ext Iliac- & Com Fem V. reducing 60-80% venous  compression to ~0%).;; also noted significant L-sided venous compression (staged stenting).   [name] LIPOMA EXCISION     x 2 came back again  . NM MYOVIEW LTD  11/2005   [facility] - Dr. Cleda Mccreedy): Normal myocardial perfusion scan. No ischemia or infarction.  . TRANSTHORACIC ECHOCARDIOGRAM  04/26/2017    [facility] - Dr. Cleda Mccreedy) - report not available.  Per clinic note: Normal global LV function. Mild concentric temperature. Mild TR. Mild pulmonary hypertension.;;   . TRANSTHORACIC ECHOCARDIOGRAM  7/'15; 7/'17   [facility] - Dr. Saverio Danker) a) low normal LV function (EF 56%). Moderate concentric LVH. Mild LA dilation. Normal pulmonary pressures.  ;; b) normal global function - EF 60-65%. Moderate pulmonary hypertension. Biatrial enlargement. Moderate concentric LVH.  [name] TRANSTHORACIC ECHOCARDIOGRAM  02/2010   [facility] - Dr. Saverio Danker): Normal global LV function. Mild LVH. EF 60%.; November 2010. Moderate LV dilation. Moderate LVH. Normal EF 50-55%. Mild to moderate pulmonary hypertension. (July 2012 showed normal function with mild LVH, mild MR noted in August 2014)  . TRANSTHORACIC ECHOCARDIOGRAM  05/2019   During admission for UTI, sepsis with A. fib RVR: LV normal size and function.  EF 60-65%.  Mildly reduced RV size with severely reduced function.  Despite this normal atrial size noted with just only mild right atrial enlargement with elevated RAP (dilated IVC).  Relatively poor TR jet, but findings do suggest elevated RV pressures.    MEDICATIONS/ALLERGIES   Current Meds  Medication Sig  . atorvastatin (LIPITOR) 10 MG tablet Take 1 tablet (10 mg total) by mouth daily.  [name] HYDROcodone-acetaminophen (NORCO/VICODIN) 5-325 MG tablet Take 1 tablet by mouth 2 (two) times daily as needed for moderate pain.  . magnesium oxide (MAG-OX) 400 (241.3 Mg) MG tablet Take 1 tablet (400 mg total) by mouth daily.  . metolazone ([name]) 5 MG tablet  Take 5 mg tablet daily as need for weight gain greater than 3 lbs  . metoprolol tartrate (LOPRESSOR) 25 MG tablet TAKE 1 TABLET IN THE MORNING AND ONE AND ONE-HALF TABLETS (37.5 MG) IN THE EVENING  . potassium chloride SA (KLOR-CON) 20 MEQ tablet Take 1 tablet (20 mEq total) by mouth 2 (two) times daily.  . sildenafil (REVATIO) 20 MG tablet Take 20 mg by mouth daily.  Alveda Reasons 20 MG TABS tablet TAKE 1 TABLET DAILY WITH SUPPER    Allergies  Allergen Reactions  . Aldactone [Spironolactone] Other (See Comments)    unknown     SOCIAL HISTORY/FAMILY HISTORY   Reviewed in Epic:  Pertinent findings: no  changes  OBJCTIVE -PE, EKG, labs   Wt Readings from Last 3 Encounters:  05/29/20 (!) 328 lb (148.8 kg)  02/27/20 (!) 338 lb 9.6 oz (153.6 kg)  02/19/20 (!) 341 lb (154.7 kg)    Physical Exam: BP 114/70   Pulse 68   Ht 6\' 1"  (1.854 m)   Wt (!) 328 lb (148.8 kg)   SpO2 97%   BMI 43.27 kg/m  Physical Exam Vitals reviewed.  Constitutional:      General: He is not in acute distress.    Appearance: He is not toxic-appearing or diaphoretic.     Comments: Morbidly obese -> but with appreciable weight loss., chronically ill-appearing.  Nontoxic.  Rolling walker.  HENT:     Head: Normocephalic and atraumatic.     Ears:     Comments: Hard of hearing Neck:     Vascular: No carotid bruit, hepatojugular reflux or JVD (Unable to assess due to body habitus).  Cardiovascular:     Rate and Rhythm: Normal rate. Rhythm irregularly irregular.     Chest Wall: PMI is not displaced.     Pulses: Decreased pulses (Unable to palpate due to edema.).     Heart sounds: S1 normal and S2 normal. Heart sounds are distant. No murmur heard.  No friction rub. No gallop.   Pulmonary:     Effort: Pulmonary effort is normal.  Chest:     Chest wall: No tenderness.  Musculoskeletal:        General: Swelling (BLE 2-3+ Edema - stable) present. Normal range of motion.     Cervical back: Normal range of motion  and neck supple.  Neurological:     General: No focal deficit present.     Mental Status: He is alert and oriented to person, place, and time.  Psychiatric:        Mood and Affect: Mood normal.        Behavior: Behavior normal.        Thought Content: Thought content normal.        Judgment: Judgment normal.     Adult ECG Report  Rate: 69 ;  Rhythm: atrial flutter and Low-voltage.  Nonspecific ST-T wave changes.  No acute changes.;   Narrative Interpretation: Stable  Recent Labs:    Lipids problem Jan 27, 2020: TC 108, TG 87, HDL 31, LDL 59. Lab Results  Component Value Date   CREATININE 1.64 (H) 02/21/2020   BUN 31 (H) 02/21/2020   NA 140 02/21/2020   K 4.1 02/21/2020   CL 101 02/21/2020   CO2 24 02/21/2020   No results found for: TSH  ASSESSMENT/PLAN    Problem List Items Addressed This Visit    Permanent atrial fibrillation ([place]); CHA2DS2-VASc score =5 (agex2, aortic plaque, CHF, HTN) - Primary (Chronic)    To be in either A. fib or flutter with variable conduction.  Pretty stable as far as symptoms go.  Does not notice whether he is or is not in A. fib.  For the most part he probably seems to be in A. fib.  Seems to be tolerating Xarelto relatively well with no bleeding.  Plan:  Continue current rate control with split dose metoprolol tartrate 25 mg a.m.-37.5 mg p.m.  continue Xarelto  would be okay to hold Xarelto 24-48 hours preop for procedures or surgeries.      Relevant Medications   sildenafil (REVATIO) 20 MG tablet   Chronic diastolic HF (heart failure) (HCC) (Chronic)    Clearly has  diastolic heart failure compounded with some orthopnea but I suspect this is also related to OSA.  Definitely diastolic dysfunction is exacerbated by his permanent A. Fib.  Because of renal insufficiency, he was switched to atorvastatin from ACE inhibitor/ARB.  He is on Lopressor for rate control.    On standing dose of torsemide 40 mg twice daily and requiring 1 to 2  tablets of metolazone per week for weight gain.  Plan: We will simply have him take metolazone as standing dose 2 days a week with hopes that we would potentially avoid PRN dosing, but continue as needed option as well.        Relevant Medications   sildenafil (REVATIO) 20 MG tablet   Chronic venous insufficiency (Chronic)    Continue wraps or support stockings and foot elevation.  May need higher elevation even with sleeping in the recliner.  Continue current dose of torsemide along with 2 days a week [name].  As needed [name] for additional weight gain.      Relevant Medications   sildenafil (REVATIO) 20 MG tablet   Essential hypertension (Chronic)    He had some hypotension issues in the past and we reduced his amlodipine.  Symptoms are notably now improved.  Would not push more aggressively.      Relevant Medications   sildenafil (REVATIO) 20 MG tablet   OSA on CPAP (Chronic)    Apparently a sleep study was now completed and he is on CPAP, but apparently the settings are difficult for him to handle.  I have asked that he discuss this with Dr. Elsworth Soho from pulmonary medicine.      Pulmonary hypertension (HCC) (Chronic)    Probably related is much to OSA/OHS as diastolic heart failure with A. fib.  Plan: Continue amlodipine-albeit at lower dose because of orthostatic issues.  Stable dose of diuretic -> change metolazone to 2 days standing per week with as needed dosing otherwise.      Relevant Medications   sildenafil (REVATIO) 20 MG tablet      COVID-19 Education: The signs and symptoms of COVID-19 were discussed with the patient and how to seek care for testing (follow up with PCP or arrange E-visit).   The importance of social distancing and COVID-19 vaccination was discussed today.   I spent a total of 36 minutes with the patient. >  50% of the time was spent in direct patient consultation.  Additional time spent with chart review  / charting (studies, outside  notes, etc): 8 Total Time: 42 min   Current medicines are reviewed at length with the patient today.  (+/- concerns) none  Notice: This dictation was prepared with Dragon dictation along with smaller phrase technology. Any transcriptional errors that result from this process are unintentional and may not be corrected upon review.  Patient Instructions / Medication Changes & Studies & Tests Ordered   Patient Instructions  Medication Instructions:  Take your [name] two days a week.  *If you need a refill on your cardiac medications before your next appointment, please call your pharmacy*  Follow-Up: At [facility], you and your health needs are our priority.  As part of our continuing mission to provide you with exceptional heart care, we have created designated Provider Care Teams.  These Care Teams include your primary Cardiologist (physician) and Advanced Practice Providers (APPs -  Physician Assistants and Nurse Practitioners) who all work together to provide you with the care you need, when you need it.  We recommend  signing up for the patient portal called "MyChart".  Sign up information is provided on this After Visit Summary.  MyChart is used to connect with patients for Virtual Visits (Telemedicine).  Patients are able to view lab/test results, encounter notes, upcoming appointments, etc.  Non-urgent messages can be sent to your provider as well.   To learn more about what you can do with MyChart, go to [email or web address].    Your next appointment:   6 month(s)  The format for your next appointment:   In Person  Provider:   Eleonore Chiquito, MD       Studies Ordered:   No orders of the defined types were placed in this encounter.    Glenetta Hew, M.D., M.S. Interventional Cardiologist   Pager # [phone] Phone # [phone] [address]. [place], [place] [address]   Thank you for choosing Heartcare at [facility]!!

## 2020-05-29 NOTE — Patient Instructions (Signed)
Medication Instructions:  Take your [name] two days a week.  *If you need a refill on your cardiac medications before your next appointment, please call your pharmacy*  Follow-Up: At [facility], you and your health needs are our priority.  As part of our continuing mission to provide you with exceptional heart care, we have created designated Provider Care Teams.  These Care Teams include your primary Cardiologist (physician) and Advanced Practice Providers (APPs -  Physician Assistants and Nurse Practitioners) who all work together to provide you with the care you need, when you need it.  We recommend signing up for the patient portal called "MyChart".  Sign up information is provided on this After Visit Summary.  MyChart is used to connect with patients for Virtual Visits (Telemedicine).  Patients are able to view lab/test results, encounter notes, upcoming appointments, etc.  Non-urgent messages can be sent to your provider as well.   To learn more about what you can do with MyChart, go to [email or web address].    Your next appointment:   6 month(s)  The format for your next appointment:   In Person  Provider:   Eleonore Chiquito, MD

## 2020-06-04 ENCOUNTER — Other Ambulatory Visit: Payer: Self-pay | Admitting: *Deleted

## 2020-06-04 DIAGNOSIS — R911 Solitary pulmonary nodule: Secondary | ICD-10-CM

## 2020-06-07 ENCOUNTER — Encounter: Payer: Self-pay | Admitting: Cardiology

## 2020-06-07 NOTE — Assessment & Plan Note (Signed)
Clearly has diastolic heart failure compounded with some orthopnea but I suspect this is also related to OSA.  Definitely diastolic dysfunction is exacerbated by his permanent A. Fib.  Because of renal insufficiency, he was switched to atorvastatin from ACE inhibitor/ARB.  He is on Lopressor for rate control.    On standing dose of torsemide 40 mg twice daily and requiring 1 to 2 tablets of metolazone per week for weight gain.  Plan: We will simply have him take metolazone as standing dose 2 days a week with hopes that we would potentially avoid PRN dosing, but continue as needed option as well.

## 2020-06-07 NOTE — Assessment & Plan Note (Signed)
Apparently a sleep study was now completed and he is on CPAP, but apparently the settings are difficult for him to handle.  I have asked that he discuss this with Dr. Elsworth Soho from pulmonary medicine.

## 2020-06-07 NOTE — Assessment & Plan Note (Signed)
He had some hypotension issues in the past and we reduced his amlodipine.  Symptoms are notably now improved.  Would not push more aggressively.

## 2020-06-07 NOTE — Assessment & Plan Note (Signed)
Continue wraps or support stockings and foot elevation.  May need higher elevation even with sleeping in the recliner.  Continue current dose of torsemide along with 2 days a week [name].  As needed [name] for additional weight gain.

## 2020-06-07 NOTE — Assessment & Plan Note (Addendum)
To be in either A. fib or flutter with variable conduction.  Pretty stable as far as symptoms go.  Does not notice whether he is or is not in A. fib.  For the most part he probably seems to be in A. fib.  Seems to be tolerating Xarelto relatively well with no bleeding.  Plan:  Continue current rate control with split dose metoprolol tartrate 25 mg a.m.-37.5 mg p.m.  continue Xarelto  would be okay to hold Xarelto 24-48 hours preop for procedures or surgeries.

## 2020-06-07 NOTE — Assessment & Plan Note (Signed)
Probably related is much to OSA/OHS as diastolic heart failure with A. fib.  Plan: Continue amlodipine-albeit at lower dose because of orthostatic issues.  Stable dose of diuretic -> change metolazone to 2 days standing per week with as needed dosing otherwise.

## 2020-06-19 DIAGNOSIS — Z23 Encounter for immunization: Secondary | ICD-10-CM | POA: Diagnosis not present

## 2020-07-21 DIAGNOSIS — G894 Chronic pain syndrome: Secondary | ICD-10-CM | POA: Diagnosis not present

## 2020-07-21 DIAGNOSIS — M169 Osteoarthritis of hip, unspecified: Secondary | ICD-10-CM | POA: Diagnosis not present

## 2020-07-21 DIAGNOSIS — Z79891 Long term (current) use of opiate analgesic: Secondary | ICD-10-CM | POA: Diagnosis not present

## 2020-07-21 DIAGNOSIS — M25551 Pain in right hip: Secondary | ICD-10-CM | POA: Diagnosis not present

## 2020-07-21 DIAGNOSIS — M1710 Unilateral primary osteoarthritis, unspecified knee: Secondary | ICD-10-CM | POA: Diagnosis not present

## 2020-07-21 DIAGNOSIS — Z79899 Other long term (current) drug therapy: Secondary | ICD-10-CM | POA: Diagnosis not present

## 2020-07-23 DIAGNOSIS — Z20828 Contact with and (suspected) exposure to other viral communicable diseases: Secondary | ICD-10-CM | POA: Diagnosis not present

## 2020-07-23 DIAGNOSIS — Z03818 Encounter for observation for suspected exposure to other biological agents ruled out: Secondary | ICD-10-CM | POA: Diagnosis not present

## 2020-07-29 ENCOUNTER — Other Ambulatory Visit: Payer: Self-pay | Admitting: Family Medicine

## 2020-07-29 DIAGNOSIS — I5032 Chronic diastolic (congestive) heart failure: Secondary | ICD-10-CM | POA: Diagnosis not present

## 2020-07-29 DIAGNOSIS — F5221 Male erectile disorder: Secondary | ICD-10-CM | POA: Diagnosis not present

## 2020-07-29 DIAGNOSIS — I10 Essential (primary) hypertension: Secondary | ICD-10-CM | POA: Diagnosis not present

## 2020-07-29 DIAGNOSIS — I714 Abdominal aortic aneurysm, without rupture: Secondary | ICD-10-CM | POA: Diagnosis not present

## 2020-07-29 DIAGNOSIS — I700 Atherosclerosis of aorta: Secondary | ICD-10-CM | POA: Diagnosis not present

## 2020-07-29 DIAGNOSIS — I890 Lymphedema, not elsewhere classified: Secondary | ICD-10-CM | POA: Diagnosis not present

## 2020-07-29 DIAGNOSIS — Z79899 Other long term (current) drug therapy: Secondary | ICD-10-CM | POA: Diagnosis not present

## 2020-07-29 DIAGNOSIS — I77811 Abdominal aortic ectasia: Secondary | ICD-10-CM

## 2020-07-29 DIAGNOSIS — I272 Pulmonary hypertension, unspecified: Secondary | ICD-10-CM | POA: Diagnosis not present

## 2020-07-29 DIAGNOSIS — E7800 Pure hypercholesterolemia, unspecified: Secondary | ICD-10-CM | POA: Diagnosis not present

## 2020-08-11 DIAGNOSIS — L609 Nail disorder, unspecified: Secondary | ICD-10-CM | POA: Diagnosis not present

## 2020-08-11 DIAGNOSIS — S91209A Unspecified open wound of unspecified toe(s) with damage to nail, initial encounter: Secondary | ICD-10-CM | POA: Diagnosis not present

## 2020-08-12 ENCOUNTER — Encounter: Payer: Self-pay | Admitting: Nephrology

## 2020-08-12 ENCOUNTER — Other Ambulatory Visit: Payer: Self-pay

## 2020-08-12 ENCOUNTER — Emergency Department (HOSPITAL_COMMUNITY): Payer: Medicare Other

## 2020-08-12 ENCOUNTER — Emergency Department (HOSPITAL_COMMUNITY)
Admission: EM | Admit: 2020-08-12 | Discharge: 2020-08-12 | Disposition: A | Discharge status: Home / Self Care | Payer: Medicare Other | Attending: Emergency Medicine | Admitting: Emergency Medicine

## 2020-08-12 DIAGNOSIS — I5032 Chronic diastolic (congestive) heart failure: Secondary | ICD-10-CM | POA: Diagnosis not present

## 2020-08-12 DIAGNOSIS — N3289 Other specified disorders of bladder: Secondary | ICD-10-CM

## 2020-08-12 DIAGNOSIS — Y9301 Activity, walking, marching and hiking: Secondary | ICD-10-CM | POA: Diagnosis not present

## 2020-08-12 DIAGNOSIS — R519 Headache, unspecified: Secondary | ICD-10-CM | POA: Insufficient documentation

## 2020-08-12 DIAGNOSIS — N1330 Unspecified hydronephrosis: Secondary | ICD-10-CM | POA: Diagnosis not present

## 2020-08-12 DIAGNOSIS — N200 Calculus of kidney: Secondary | ICD-10-CM | POA: Diagnosis not present

## 2020-08-12 DIAGNOSIS — N189 Chronic kidney disease, unspecified: Secondary | ICD-10-CM | POA: Diagnosis not present

## 2020-08-12 DIAGNOSIS — I110 Hypertensive heart disease with heart failure: Secondary | ICD-10-CM | POA: Insufficient documentation

## 2020-08-12 DIAGNOSIS — N329 Bladder disorder, unspecified: Secondary | ICD-10-CM | POA: Diagnosis not present

## 2020-08-12 DIAGNOSIS — K8020 Calculus of gallbladder without cholecystitis without obstruction: Secondary | ICD-10-CM | POA: Diagnosis not present

## 2020-08-12 DIAGNOSIS — G319 Degenerative disease of nervous system, unspecified: Secondary | ICD-10-CM | POA: Diagnosis not present

## 2020-08-12 DIAGNOSIS — Z7901 Long term (current) use of anticoagulants: Secondary | ICD-10-CM | POA: Diagnosis not present

## 2020-08-12 DIAGNOSIS — Z87891 Personal history of nicotine dependence: Secondary | ICD-10-CM | POA: Diagnosis not present

## 2020-08-12 DIAGNOSIS — I714 Abdominal aortic aneurysm, without rupture: Secondary | ICD-10-CM | POA: Diagnosis not present

## 2020-08-12 DIAGNOSIS — S6991XA Unspecified injury of right wrist, hand and finger(s), initial encounter: Secondary | ICD-10-CM | POA: Diagnosis present

## 2020-08-12 DIAGNOSIS — M1711 Unilateral primary osteoarthritis, right knee: Secondary | ICD-10-CM | POA: Diagnosis not present

## 2020-08-12 DIAGNOSIS — N1831 Chronic kidney disease, stage 3a: Secondary | ICD-10-CM | POA: Diagnosis not present

## 2020-08-12 DIAGNOSIS — W010XXA Fall on same level from slipping, tripping and stumbling without subsequent striking against object, initial encounter: Secondary | ICD-10-CM | POA: Insufficient documentation

## 2020-08-12 DIAGNOSIS — Z79899 Other long term (current) drug therapy: Secondary | ICD-10-CM | POA: Insufficient documentation

## 2020-08-12 DIAGNOSIS — Z8616 Personal history of COVID-19: Secondary | ICD-10-CM | POA: Insufficient documentation

## 2020-08-12 DIAGNOSIS — R609 Edema, unspecified: Secondary | ICD-10-CM | POA: Diagnosis not present

## 2020-08-12 DIAGNOSIS — N132 Hydronephrosis with renal and ureteral calculous obstruction: Secondary | ICD-10-CM | POA: Insufficient documentation

## 2020-08-12 DIAGNOSIS — Z23 Encounter for immunization: Secondary | ICD-10-CM | POA: Diagnosis not present

## 2020-08-12 DIAGNOSIS — I129 Hypertensive chronic kidney disease with stage 1 through stage 4 chronic kidney disease, or unspecified chronic kidney disease: Secondary | ICD-10-CM | POA: Diagnosis not present

## 2020-08-12 DIAGNOSIS — S60511A Abrasion of right hand, initial encounter: Secondary | ICD-10-CM | POA: Insufficient documentation

## 2020-08-12 DIAGNOSIS — N179 Acute kidney failure, unspecified: Secondary | ICD-10-CM | POA: Diagnosis not present

## 2020-08-12 DIAGNOSIS — S80211A Abrasion, right knee, initial encounter: Secondary | ICD-10-CM | POA: Insufficient documentation

## 2020-08-12 DIAGNOSIS — W19XXXA Unspecified fall, initial encounter: Secondary | ICD-10-CM

## 2020-08-12 DIAGNOSIS — M25531 Pain in right wrist: Secondary | ICD-10-CM | POA: Diagnosis not present

## 2020-08-12 DIAGNOSIS — S0990XA Unspecified injury of head, initial encounter: Secondary | ICD-10-CM | POA: Diagnosis not present

## 2020-08-12 LAB — URINALYSIS, ROUTINE W REFLEX MICROSCOPIC
Bacteria, UA: NONE SEEN
Bilirubin Urine: NEGATIVE
Glucose, UA: NEGATIVE mg/dL
Hgb urine dipstick: NEGATIVE
Ketones, ur: NEGATIVE mg/dL
Nitrite: NEGATIVE
Protein, ur: NEGATIVE mg/dL
Specific Gravity, Urine: 1.013 (ref 1.005–1.030)
pH: 7 (ref 5.0–8.0)

## 2020-08-12 LAB — CBC WITH DIFFERENTIAL/PLATELET
Abs Immature Granulocytes: 0.02 10*3/uL (ref 0.00–0.07)
Basophils Absolute: 0 10*3/uL (ref 0.0–0.1)
Basophils Relative: 1 %
Eosinophils Absolute: 0.1 10*3/uL (ref 0.0–0.5)
Eosinophils Relative: 2 %
HCT: 36.1 % — ABNORMAL LOW (ref 39.0–52.0)
Hemoglobin: 11.3 g/dL — ABNORMAL LOW (ref 13.0–17.0)
Immature Granulocytes: 0 %
Lymphocytes Relative: 17 %
Lymphs Abs: 1 10*3/uL (ref 0.7–4.0)
MCH: 30.3 pg (ref 26.0–34.0)
MCHC: 31.3 g/dL (ref 30.0–36.0)
MCV: 96.8 fL (ref 80.0–100.0)
Monocytes Absolute: 0.5 10*3/uL (ref 0.1–1.0)
Monocytes Relative: 9 %
Neutro Abs: 4 10*3/uL (ref 1.7–7.7)
Neutrophils Relative %: 71 %
Platelets: 212 10*3/uL (ref 150–400)
RBC: 3.73 MIL/uL — ABNORMAL LOW (ref 4.22–5.81)
RDW: 14.4 % (ref 11.5–15.5)
WBC: 5.6 10*3/uL (ref 4.0–10.5)
nRBC: 0 % (ref 0.0–0.2)

## 2020-08-12 LAB — BASIC METABOLIC PANEL
Anion gap: 7 (ref 5–15)
BUN: 28 mg/dL — ABNORMAL HIGH (ref 8–23)
CO2: 25 mmol/L (ref 22–32)
Calcium: 8.8 mg/dL — ABNORMAL LOW (ref 8.9–10.3)
Chloride: 111 mmol/L (ref 98–111)
Creatinine, Ser: 1.99 mg/dL — ABNORMAL HIGH (ref 0.61–1.24)
GFR, Estimated: 33 mL/min — ABNORMAL LOW (ref >60)
Glucose, Bld: 120 mg/dL — ABNORMAL HIGH (ref 70–99)
Potassium: 4.2 mmol/L (ref 3.5–5.1)
Sodium: 143 mmol/L (ref 135–145)

## 2020-08-12 MED ORDER — TETANUS-DIPHTH-ACELL PERTUSSIS 5-2.5-18.5 LF-MCG/0.5 IM SUSY
0.5000 mL | PREFILLED_SYRINGE | Freq: Once | INTRAMUSCULAR | Status: AC
  Administered 2020-08-12: 0.5 mL via INTRAMUSCULAR
  Filled 2020-08-12: qty 0.5

## 2020-08-12 NOTE — Discharge Instructions (Addendum)
Your creatinine today was 1.99.  This appears to be trending down from when it was checked by pcp on 08/01/2020.  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  This will need to be followed up by urology.  You should call Dr. Gilford Rile office first thing tomorrow morning and schedule the next available appointment.  Wash wounds daily on hand and knee soap and water.  Apply antibiotic ointment as needed.  Return to the emergency department for any new or worsening symptoms.  Thank you for allowing [place] to care for you today.

## 2020-08-12 NOTE — Progress Notes (Signed)
Saw patient today as a new referral for AKI on CKD (Cr 2.7 when checked with PCP earlier this month). Unfortunately, patient had a mechanical fall earlier today and as per family they will be going to the ER after today's visit (did hit his head, is on Xarelto for A fib). 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  Gean Quint, MD [facility]

## 2020-08-12 NOTE — ED Notes (Signed)
Patient transported to Ultrasound 

## 2020-08-12 NOTE — ED Triage Notes (Signed)
Patient tripped walking out of a store today and fell onto his hands and knees, then onto his head. States he feels like he let his walker get to far ahead of him and he tried to catch it and that caused his fall. Patient complains of pain to right knee, small abrasion to right fifth finger, denies other complaints. Patient alert, oriented, and in no apparent distress.

## 2020-08-12 NOTE — ED Provider Notes (Addendum)
[facility] EMERGENCY DEPARTMENT Provider Note   CSN: [id number] Arrival date & time: 08/12/20  1528     History Chief Complaint  Patient presents with  . Fall    Patrick Bishop is a 79 y.o. male with past medical history of arthritis, CHF, chronic venous insufficiency, A. fib, prediabetes.  Anticoagulated on xarelto. Tetanus not up-to-date.  HPI Patient presents to emergency department today with chief complaint of mechanical fall.  This happened just prior to arrival.  Patient's daughter is at the bedside and is contributing historian.  Patient states he was walking down the ramp wall pushing his walker and unfortunately the walker got away from him causing him to fall.  He states he landed on his right knee and right wrist then fell to the ground hitting his head.  He denies any loss of consciousness.  He was able to get up with assistance from multiple people and EMS.  He has been ambulatory since the fall.  He denies any pain from the fall, states the only pain he has right now is his typical arthritis pain.  He denies any fever, chills, headache, neck pain, chest pain or shortness of breath, abdominal pain, nausea, emesis, back pain. Fall was witnessed by daughter, there was no seizure like activity.  Patient denies any fever, chills, numbness, tingling or weakness. Patient's daughter is asking if it is possible to have renal US and labs performed while he is here in the ED.  She states patient had an appointment with his PCP this morning prior to the fall.  At that appointment it was found that he had a creatinine of 1.9 today and this was compared to 11/3 when it was 2.6. PCP planned for patient to have outpatient renal US.       Past Medical History:  Diagnosis Date  . Arthritis   . CHF (congestive heart failure) ([place])   . Chronic diastolic heart failure (HCC)    Normal LV Fxn by Echo 04/2017   . Chronic venous insufficiency 2015   s/p Bilateral Iliac &  Femoral Stents (including IVC) - DEEP Venous Reflux.  Also s/p Bilateral GSV Ablation.  [name] Dyspnea   . Dysrhythmia    a fib   . Essential hypertension   . History of kidney stones   . Hx of adenomatous colonic polyps   . Hyperlipidemia with target LDL less than 100   . Longstanding persistent atrial fibrillation ([place])   . Nephrolithiasis 05/2019  . Obesity   . OSA on CPAP    May need New CPAP machine & CPAP MD)  . Pneumonia   . Pre-diabetes   . Pulmonary hypertension ([place])   . Pyelonephritis 05/2019  . Venous insufficiency    LE    Patient Active Problem List   Diagnosis Date Noted  . Preop cardiovascular exam 08/27/2019  . Pneumonia due to COVID-19 virus 08/05/2019  . Pneumonia 08/03/2019  . Suspected COVID-19 virus infection   . Shortness of breath 08/02/2019  . Leukopenia   . Heme + stool   . Decreased hemoglobin   . Hydronephrosis with obstructing calculus 06/24/2019  . Cholelithiasis 06/24/2019  . Arthritis 07/12/2018  . Chronic pain 07/12/2018  . Hearing loss 07/12/2018  . Lymphedema 07/12/2018  . Muscle weakness 07/12/2018  . Unsteady gait 07/12/2018  . Morbid obesity ([place]) 07/08/2018  . 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 Chronic diastolic HF (heart failure) ([place])   .  Chronic venous insufficiency   . Essential hypertension   . Permanent atrial fibrillation (HCC); CHA2DS2-VASc score =5 (agex2, aortic plaque, CHF, HTN)   . OSA on CPAP   . Pulmonary hypertension ([place])   . Hyperlipidemia with target LDL less than 100     Past Surgical History:  Procedure Laterality Date  . ABDOMINAL VENOUS DUPLEX Bilateral 07/2014   ([place]iology: Dr. Cleda Mccreedy): (prior to Bilateral Iliac-Femoral Venous Stent Placement): Patent IVC.  No DVT B/L Iliac or Com fem V..  +++ DEEP V REFLUX B/L ILIAC & FEM V.  Absent Superficial V Reflux B/L x L SSV (too tortuous for intervention).  BL GSV occluded - prior ablation.   . ABDOMINAL VENOUS DUPLEX  Bilateral 11/2014   Absent DVT.  Left common femoral venous reflux: 1508 MS left common femoral vein, 2284MS left proximal femoral vein -> cystic structure noted in the left popliteal vein (7.6 x 1.4 x 3.8 cm).;  Right leg deep venous reflux noted in the distal iliac, common femoral, proximal profunda femoral, proximal superficial femoral, superficial femoral and popliteal vein.  [name] CARDIAC CATHETERIZATION  10/2009   [facility] - Dr. 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  EF~45%.  Mild pulmonary pretension.  [name] CARDIAC PET  08/2009   Inferolateral partially reversible defect -- > false positive by cath  . COLONOSCOPY    . CYSTOSCOPY/URETEROSCOPY/HOLMIUM LASER/STENT PLACEMENT Left 11/06/2019   Procedure: CYSTOSCOPY/RETROGRADE/URETEROSCOPY/HOLMIUM LASER/STENT PLACEMENT. REMOVAL NEPHROSCOPYTUBE;  Surgeon: Ceasar Mons, MD;  Location: [facility] ORS;  Service: Urology;  Laterality: Left;  . ESOPHAGOGASTRODUODENOSCOPY    . GREATER SAPHENOUS VEIN ABLATION Bilateral Before 2015   [facility] (Dr. Eugenie Filler)  . IR NEPHROSTOMY EXCHANGE LEFT  08/28/2019  . IR NEPHROSTOMY PLACEMENT LEFT  06/24/2019  . IR TRANSCATH PLC STENT  EA ADD VEIN  INC ANGIOPLASTY Left 10/2014   [facility], Dr. 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 IR TRANSCATH PLC STENT  INITIAL VEIN  INC ANGIOPLASTY Right 08/2014   [facility] - Dr. Stormy Fabian): US Guided --> R Common & External Iliac, Common Femoral Venography with Placement of 24 mmx 70 mm Stent - R Com Iliac-Ext Iliac- & Com Fem V. reducing 60-80% venous compression to ~0%).;; also noted significant L-sided venous compression (staged stenting).   [name] LIPOMA EXCISION     x 2 came back again  . NM  MYOVIEW LTD  11/2005   Adenosine Myoview [facility] - Dr. Cleda Mccreedy): Normal myocardial perfusion scan. No ischemia or infarction.  . TRANSTHORACIC ECHOCARDIOGRAM  04/26/2017    [facility] - Dr. Cleda Mccreedy) - report not available.  Per clinic note: Normal global LV function. Mild concentric temperature. Mild TR. Mild pulmonary hypertension.;;   . TRANSTHORACIC ECHOCARDIOGRAM  7/'15; 7/'17   [facility] - Dr. Saverio Danker) a) low normal LV function (EF 56%). Moderate concentric LVH. Mild LA dilation. Normal pulmonary pressures.  ;; b) normal global function - EF 60-65%. Moderate pulmonary hypertension. Biatrial enlargement. Moderate concentric LVH.  [name] TRANSTHORACIC ECHOCARDIOGRAM  02/2010   [facility] - Dr. Saverio Danker): Normal global LV function. Mild LVH. EF 60%.; November 2010. Moderate LV dilation. Moderate LVH. Normal EF 50-55%. Mild to moderate pulmonary hypertension. (July 2012 showed  normal function with mild LVH, mild MR noted in August 2014)  . TRANSTHORACIC ECHOCARDIOGRAM  05/2019   During admission for UTI, sepsis with A. fib RVR: LV normal size and function.  EF 60-65%.  Mildly reduced RV size with severely reduced function.  Despite this normal atrial size noted with just only mild right atrial enlargement with elevated RAP (dilated IVC).  Relatively poor TR jet, but findings do suggest elevated RV pressures.       Family History  Problem Relation Age of Onset  . Cancer Mother   . Heart attack Father 57  . Hypertension Brother   . Cancer Sister   . Other Neg Hx        He really does not know much about his parents and siblings health, but they did not speak about healthcare    Social History   Tobacco Use  . Smoking status: Former Smoker    Packs/day: 1.00    Years: 35.00    Pack years: 35.00    Types: Cigarettes    Quit date: 1996    Years since quitting: 25.8  . Smokeless tobacco: Never Used  . Tobacco comment: smoked from age [age]     Vaping Use  . Vaping Use: Never used  Substance Use Topics  . Alcohol use: Yes    Comment: occasional  . Drug use: No    Home Medications Prior to Admission medications   Medication Sig Start Date End Date Taking? Authorizing Provider  amLODipine (NORVASC) 5 MG tablet Take 1 tablet (5 mg total) by mouth daily. 02/27/20 05/27/20  [name], MD  atorvastatin (LIPITOR) 10 MG tablet Take 1 tablet (10 mg total) by mouth daily. 10/21/19   [name], MD  HYDROcodone-acetaminophen (NORCO/VICODIN) 5-325 MG tablet Take 1 tablet by mouth 2 (two) times daily as needed for moderate pain. 07/02/19   Geradine Girt, DO  magnesium oxide (MAG-OX) 400 (241.3 Mg) MG tablet Take 1 tablet (400 mg total) by mouth daily. 07/01/19   Geradine Girt, DO  metolazone ([name]) 5 MG tablet Take 5 mg tablet daily as need for weight gain greater than 3 lbs 02/27/20   [name], MD  metoprolol tartrate (LOPRESSOR) 25 MG tablet TAKE 1 TABLET IN THE MORNING AND ONE AND ONE-HALF TABLETS (37.5 MG) IN THE EVENING 01/14/20   [name], MD  potassium chloride SA (KLOR-CON) 20 MEQ tablet Take 1 tablet (20 mEq total) by mouth 2 (two) times daily. 02/27/20   [name], MD  sildenafil (REVATIO) 20 MG tablet Take 20 mg by mouth daily. 05/07/20   [provider]  torsemide (DEMADEX) 20 MG tablet Take 2 tablets (40 mg total) by mouth 2 (two) times daily. 01/15/20 04/14/20  Lendon Colonel, NP  XARELTO 20 MG TABS tablet TAKE 1 TABLET DAILY WITH SUPPER 10/21/19   [name], MD    Allergies    Aldactone [spironolactone]  Review of Systems   Review of Systems All other systems are reviewed and are negative for acute change except as noted in the HPI.  Physical Exam Updated Vital Signs BP 136/65   Pulse 76   Temp 98 F (36.7 C) (Oral)   Resp 19   SpO2 95%   Physical Exam Vitals and nursing note reviewed.  Constitutional:      General: He is not in acute distress.    Appearance:  He is not ill-appearing.  HENT:     Head:  Normocephalic and atraumatic.     Right Ear: Tympanic membrane and external ear normal.     Left Ear: Tympanic membrane and external ear normal.     Nose: Nose normal.     Mouth/Throat:     Mouth: Mucous membranes are moist.     Pharynx: Oropharynx is clear.  Eyes:     General: No scleral icterus.       Right eye: No discharge.        Left eye: No discharge.     Extraocular Movements: Extraocular movements intact.     Conjunctiva/sclera: Conjunctivae normal.     Pupils: Pupils are equal, round, and reactive to light.  Neck:     Vascular: No JVD.  Cardiovascular:     Rate and Rhythm: Normal rate. Rhythm irregular.     Pulses: Normal pulses.          Radial pulses are 2+ on the right side and 2+ on the left side.     Heart sounds: Normal heart sounds.  Pulmonary:     Comments: Lungs clear to auscultation in all fields. Symmetric chest rise. No wheezing, rales, or rhonchi. Abdominal:     Comments: Abdomen is soft, non-distended, and non-tender in all quadrants. No rigidity, no guarding. No peritoneal signs.  Musculoskeletal:        General: Normal range of motion.     Cervical back: Normal range of motion.     Right lower leg: Edema present.     Left lower leg: Edema present.     Comments: Full ROM of all extremities.  Compartments are soft bilateral upper extremity.  Bilateral lower extremity edema chronic per patient and daughter.  Feet:     Right foot:     Toenail Condition: Right toenails are abnormally thick.     Left foot:     Toenail Condition: Left toenails are abnormally thick.  Skin:    General: Skin is warm and dry.     Capillary Refill: Capillary refill takes less than 2 seconds.     Comments: Equal tactile temperature in all extremities.  Abrasion to palm of right hand and right knee. No active bleeding. No foreign body seen  Neurological:     Mental Status: He is oriented to person, place, and time.     GCS: GCS eye  subscore is 4. GCS verbal subscore is 5. GCS motor subscore is 6.     Comments: Fluent speech, no facial droop.  Psychiatric:        Behavior: Behavior normal.     ED Results / Procedures / Treatments   Labs (all labs ordered are listed, but only abnormal results are displayed) Labs Reviewed  CBC WITH DIFFERENTIAL/PLATELET - Abnormal; Notable for the following components:      Result Value   RBC 3.73 (*)    Hemoglobin 11.3 (*)    HCT 36.1 (*)    All other components within normal limits  BASIC METABOLIC PANEL - Abnormal; Notable for the following components:   Glucose, Bld 120 (*)    BUN 28 (*)    Creatinine, Ser 1.99 (*)    Calcium 8.8 (*)    GFR, Estimated 33 (*)    All other components within normal limits  URINALYSIS, ROUTINE W REFLEX MICROSCOPIC - Abnormal; Notable for the following components:   Leukocytes,Ua TRACE (*)    All other components within normal limits    EKG None  Radiology DG Wrist Complete Right  Result Date:  08/12/2020 CLINICAL DATA:  Fall, wrist pain EXAM: RIGHT WRIST - COMPLETE 3+ VIEW COMPARISON:  None. FINDINGS: Degenerative changes at the 1st carpometacarpal joint with joint space narrowing and spurring. No acute bony abnormality. Specifically, no fracture, subluxation, or dislocation. Soft tissues are intact. IMPRESSION: No acute bony abnormality. Electronically Signed   By: Rolm Baptise M.D.   On: 08/12/2020 18:43   CT Head Wo Contrast  Result Date: 08/12/2020 CLINICAL DATA:  Fall, hit head EXAM: CT HEAD WITHOUT CONTRAST TECHNIQUE: Contiguous axial images were obtained from the base of the skull through the vertex without intravenous contrast. COMPARISON:  None. FINDINGS: Brain: Diffuse cerebral atrophy. No acute intracranial abnormality. Specifically, no hemorrhage, hydrocephalus, mass lesion, acute infarction, or significant intracranial injury. Vascular: No hyperdense vessel or unexpected calcification. Skull: No acute calvarial abnormality.  Sinuses/Orbits: Visualized paranasal sinuses and mastoids clear. Orbital soft tissues unremarkable. Other: None IMPRESSION: No acute intracranial abnormality. Electronically Signed   By: Rolm Baptise M.D.   On: 08/12/2020 18:45   US Renal  Result Date: 08/12/2020 CLINICAL DATA:  Acute kidney injury EXAM: RENAL / URINARY TRACT ULTRASOUND COMPLETE COMPARISON:  12/23/2019 FINDINGS: Right Kidney: Renal measurements: 11.1 x 5.5 x 5.1 cm = volume: 162 mL. Echogenicity within normal limits. No mass or hydronephrosis visualized. Left Kidney: Renal measurements: 11.7 x 5.6 x 6.8 cm = volume: 235 mL. Severe hydronephrosis and cortical thinning. Bladder: Appears normal for degree of bladder distention. Other: None. IMPRESSION: Severe left hydronephrosis.  Cortical thinning on the left. Electronically Signed   By: Rolm Baptise M.D.   On: 08/12/2020 18:07   DG Knee Complete 4 Views Right  Result Date: 08/12/2020 CLINICAL DATA:  Right knee pain EXAM: RIGHT KNEE - COMPLETE 4+ VIEW COMPARISON:  None. FINDINGS: Advanced degenerative changes with joint space narrowing and spurring throughout the right knee. No joint effusion. No acute bony abnormality. Specifically, no fracture, subluxation, or dislocation. IMPRESSION: Advanced tricompartment degenerative changes. No acute bony abnormality. Electronically Signed   By: Rolm Baptise M.D.   On: 08/12/2020 18:44   CT Renal Stone Study  Result Date: 08/12/2020 CLINICAL DATA:  79 year old male with acute renal failure. EXAM: CT ABDOMEN AND PELVIS WITHOUT CONTRAST TECHNIQUE: Multidetector CT imaging of the abdomen and pelvis was performed following the standard protocol without IV contrast. COMPARISON:  CT abdomen pelvis dated 08/08/2019. FINDINGS: Evaluation of this exam is limited in the absence of intravenous contrast. Lower chest: There are minimal bibasilar atelectasis. The visualized lung bases are otherwise clear. There is mild eventration of the right hemidiaphragm.  Mild cardiomegaly. There is coronary vascular calcification. No intra-abdominal free air or free fluid. Hepatobiliary: The liver is unremarkable. No intrahepatic biliary ductal dilatation. Several small stones noted in the gallbladder. No pericholecystic fluid or evidence of acute cholecystitis by CT. Pancreas: Unremarkable. No pancreatic ductal dilatation or surrounding inflammatory changes. Spleen: Normal in size without focal abnormality. Adrenals/Urinary Tract: The adrenal glands unremarkable. There is moderate left hydronephrosis with mild left hydroureter. No stone identified. 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 Further evaluation with cystoscopy is recommended. The right kidney, right ureter appear unremarkable. Stomach/Bowel: There is no bowel obstruction or active inflammation. Normal appendix. Vascular/Lymphatic: Moderate aortoiliac atherosclerotic disease. There is a 3.3 cm infrarenal abdominal aortic aneurysm similar to prior CT. The IVC is unremarkable. Bilateral iliac vein stents as seen previously. Evaluation of  the stents is limited on this noncontrast CT. No portal venous gas. There is no adenopathy. Reproductive: The prostate and seminal vesicles are grossly unremarkable. Other: None Musculoskeletal: Degenerative changes of the spine. No acute osseous pathology. Severe left hip osteoarthritis with bone-on-bone contact. IMPRESSION: 1. Moderate left hydronephrosis, possibly secondary to occlusion of the left UVJ by bladder mass. Further evaluation with cystoscopy is recommended. 2. Cholelithiasis. 3. Stable 3.3 cm infrarenal abdominal aortic aneurysm. Recommend follow-up ultrasound every 3 years. This recommendation follows ACR consensus guidelines: White Paper of the ACR Incidental Findings Committee II on Vascular Findings. J  Am Coll Radiol 2013; 75:643-329. 4. Aortic Atherosclerosis (ICD10-I70.0). Electronically Signed   By: Anner Crete M.D.   On: 08/12/2020 21:27    Procedures Procedures (including critical care time)  Medications Ordered in ED Medications  Tdap (BOOSTRIX) injection 0.5 mL (0.5 mLs Intramuscular Given 08/12/20 1829)    ED Course  I have reviewed the triage vital signs and the nursing notes.  Pertinent labs & imaging results that were available during my care of the patient were reviewed by me and considered in my medical decision making (see chart for details).    MDM Rules/Calculators/A&P                          History provided by patient with additional history obtained from chart review.    Patient is very well-appearing, no acute distress.  Hemodynamically stable.  Patient presenting after witnessed ground-level fall during his Rollator rolled away from him.  He has abrasions to right hand and right knee.  Full range of motion of all extremities.  He has bilateral lower extremity edema that is unchanged per patient and daughter.  Patient had PCP appointment today and was found to have elevated creatinine of 1.9.  He was going to have an outpatient renal ultrasound prior to the fall.  Since he is here in the emergency department renal ultrasound obtained and shows severe left hydronephrosis.  CT renal performed and shows moderate left hydronephrosis, possibly secondary to occlusion of the left UVJ by bladder mass. Also shows stable 3.3 cm infrarenal abdominal aortic aneurysm. Recommend follow-up ultrasound every 3 years, patient and daughter made aware as well. Because of the fall patient underwent CT head and x-rays of his right hand and knee.  I viewed all images which are unremarkable for any signs of a significant head injury or bleed.  No broken bones or dislocations.  Basic labs were obtained shows no leukocytosis, hemoglobin consistent with baseline.  BMP again shows he is a  creatinine of 1.99 which was found earlier today at his PCP office.  Chart review shows creatinine ranges from 1.5-1.89 the last 6 months.  UA without signs of infection.   Patient has no urinary symptoms and does not have any difficulty passing urine.  Discussed results with patient and daughter.  They are agreeable with plan to follow-up with urology outpatient for further evaluation of the bladder mass.  In basket message sent to patient's urologist Dr. Gilford Rile to help with follow up as well.  The patient appears reasonably screened and/or stabilized for discharge and I doubt any other medical condition or other [facility] requiring further screening, evaluation, or treatment in the ED at this time prior to discharge. The patient is safe for discharge with strict return precautions discussed. The patient was discussed with and seen by Dr. Sedonia Small who agrees with the treatment plan.   Portions of  this note were generated with [employer]. Dictation errors may occur despite best attempts at proofreading.   Final Clinical Impression(s) / ED Diagnoses Final diagnoses:  Fall, initial encounter  Mass of bladder    Rx / DC Orders ED Discharge Orders    None       Lewanda Rife 08/12/20 2156    Barrie Folk, PA-C 08/12/20 2158    Maudie Flakes, MD 08/13/20 [phone]

## 2020-08-12 NOTE — ED Notes (Signed)
Patient transported from U/S to X-ray

## 2020-08-13 ENCOUNTER — Ambulatory Visit
Admission: RE | Admit: 2020-08-13 | Discharge: 2020-08-13 | Disposition: A | Discharge status: Home / Self Care | Payer: Medicare Other | Source: Ambulatory Visit | Attending: Family Medicine | Admitting: Family Medicine

## 2020-08-13 DIAGNOSIS — I77811 Abdominal aortic ectasia: Secondary | ICD-10-CM

## 2020-08-13 DIAGNOSIS — I714 Abdominal aortic aneurysm, without rupture: Secondary | ICD-10-CM | POA: Diagnosis not present

## 2020-08-17 ENCOUNTER — Other Ambulatory Visit: Payer: Self-pay

## 2020-08-17 ENCOUNTER — Encounter: Payer: Medicare Other | Admitting: Podiatry

## 2020-08-17 DIAGNOSIS — M79674 Pain in right toe(s): Secondary | ICD-10-CM

## 2020-08-17 DIAGNOSIS — B351 Tinea unguium: Secondary | ICD-10-CM

## 2020-08-17 DIAGNOSIS — M79675 Pain in left toe(s): Secondary | ICD-10-CM

## 2020-08-18 DIAGNOSIS — R8271 Bacteriuria: Secondary | ICD-10-CM | POA: Diagnosis not present

## 2020-08-18 DIAGNOSIS — N130 Hydronephrosis with ureteropelvic junction obstruction: Secondary | ICD-10-CM | POA: Diagnosis not present

## 2020-08-23 NOTE — Progress Notes (Addendum)
   HPI: 79 y.o. male presenting today with a new complaint regarding a toenail problem.  Patient states that he is on anticoagulant therapy and is concerned about his toenails.  He states that his nails have lifted off of the toes and they are currently bleeding.  He does experience some dry callus tissue as well.  He also has swelling in the feet and the legs.  The lifting of the nails has been present for approximately 1-2 months now.  No treatment has been performed.  Past Medical History:  Diagnosis Date  . Arthritis   . CHF (congestive heart failure) ([place])   . Chronic diastolic heart failure (HCC)    Normal LV Fxn by Echo 04/2017   . Chronic venous insufficiency 2015   s/p Bilateral Iliac & Femoral Stents (including IVC) - DEEP Venous Reflux.  Also s/p Bilateral GSV Ablation.  [name] Dyspnea   . Dysrhythmia    a fib   . Essential hypertension   . History of kidney stones   . Hx of adenomatous colonic polyps   . Hyperlipidemia with target LDL less than 100   . Longstanding persistent atrial fibrillation ([place])   . Nephrolithiasis 05/2019  . Obesity   . OSA on CPAP    May need New CPAP machine & CPAP MD)  . Pneumonia   . Pre-diabetes   . Pulmonary hypertension ([place])   . Pyelonephritis 05/2019  . Venous insufficiency    LE     Physical Exam: General: The patient is alert and oriented x3 in no acute distress.  Dermatology: Skin is warm, dry and supple bilateral lower extremities. Negative for open lesions or macerations.  Hyperkeratotic dystrophic discolored nails noted 1-5 bilateral with some onycholysis of the nail plates to the bilateral great toes.  There is also some preulcerative callus tissue also noted to the bilateral feet  Vascular: Palpable pedal pulses bilaterally.  Bilateral lower extremity edema noted. Capillary refill within normal limits.  Neurological: Epicritic and protective threshold grossly intact bilaterally.   Musculoskeletal Exam: Range of motion within  normal limits to all pedal and ankle joints bilateral. Muscle strength 5/5 in all groups bilateral.   Assessment: 1.  Onychomycosis of toenails with onycholysis bilateral 2.  Preulcerative callus tissue bilateral 3.  Edema bilateral lower extremities   Plan of Care:  1. Patient evaluated.  2.  Mechanical debridement of nails was performed using a nail nipper without incident or bleeding. 3.  Light debridement of the preulcerative callus tissue was also performed using a tissue nipper 4.  Return to clinic as needed      Edrick Kins, DPM [facility]  Dr. Edrick Kins, DPM    [address], [place] [address]                Office [phone]  Fax [phone]

## 2020-08-25 NOTE — Addendum Note (Signed)
Addended by: Edrick Kins on: 08/25/2020 12:54 PM   Modules accepted: Level of Service

## 2020-08-26 ENCOUNTER — Telehealth: Payer: Self-pay | Admitting: Cardiology

## 2020-08-26 DIAGNOSIS — I129 Hypertensive chronic kidney disease with stage 1 through stage 4 chronic kidney disease, or unspecified chronic kidney disease: Secondary | ICD-10-CM | POA: Diagnosis not present

## 2020-08-26 DIAGNOSIS — R609 Edema, unspecified: Secondary | ICD-10-CM | POA: Diagnosis not present

## 2020-08-26 DIAGNOSIS — N179 Acute kidney failure, unspecified: Secondary | ICD-10-CM | POA: Diagnosis not present

## 2020-08-26 DIAGNOSIS — D631 Anemia in chronic kidney disease: Secondary | ICD-10-CM | POA: Diagnosis not present

## 2020-08-26 DIAGNOSIS — N1330 Unspecified hydronephrosis: Secondary | ICD-10-CM | POA: Diagnosis not present

## 2020-08-26 DIAGNOSIS — N189 Chronic kidney disease, unspecified: Secondary | ICD-10-CM | POA: Diagnosis not present

## 2020-08-26 NOTE — Telephone Encounter (Signed)
Patient called to follow-up on our recommendations. Called and spoke with him as well. Clarified history I obtained from daughter (please see note below). He does report worsening dyspnea with activity but denies any shortness of breath at rest. He states he can lay flat at night. He does report he is up 20 lbs from baseline but he reports this weight gain has been occurring longer than the past few days. Also has lower extremity edema. Discussed same recommendations that I discussed with daughter. He would like to try to see if increase in Torsemide helps before going to the ED. We can try this but emphasized that I have very low threshold for patient to go to the ED if breathing worsens or weights do not respond to increased diuretic. Recommended weighing daily and limiting sodium/fluid intake. Patient voiced understanding and agreed.  Darreld Mclean, PA-C 08/26/2020 4:23 PM Pager: [phone]

## 2020-08-26 NOTE — Telephone Encounter (Signed)
I am helping in Triage today. Called and spoke with daughter at request of triage staff. Daughter states he has had worsening dyspnea with activity (such as getting into the car) lately and patient told daughter that he has gained 20 lbs in the last few days. Patient reportedly previously weighed 319 lbs (although daughter is not exactly sure when he last weighed himself) and then he was 336 lbs at Nephrologist office today. Nephrology stopped Metolazone and increased Torsemide to  60mg  in the morning and 40mg  in the evening (previously 40mg  BID). 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 Given worsening dyspnea and kidney function and significant weight gain, I recommended going to the ED so that patient can be diuresed and renal function can be monitor closely. Daughter wanted to know if she could wait to hear results from Nephrology that she should have today or early tomorrow. As long as breathing is stable, I think that would be OK. However, discussed that I have a very low threshold for him to go to the ED. If breathing worsens at all or weight does not improve with increased Torsemide before she hears back from Nephrology, he needs to go to the ED. Recommended weighing himself daily. Daughter voiced understanding and agreed.   I will route note to Dr. Ellyn Hack so he is aware.  Patrick Mclean, PA-C 08/26/2020 4:07 PM

## 2020-08-26 NOTE — Telephone Encounter (Signed)
Patient states he received an additional call and he would like to know if we have a  recommendation. Please advise.

## 2020-08-26 NOTE — Telephone Encounter (Signed)
Returned call to daughter she states that pt went to nephrologist and he stopped the metolazone and amlodipine. Pt is SOB and his weight is up nearly 20# went from 319>336. 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 What to do now?

## 2020-08-26 NOTE — Telephone Encounter (Signed)
Patient's daughter called and stated that they need to speak with a doctor or a nurse regarding patient medication. Please call back

## 2020-08-27 ENCOUNTER — Telehealth: Payer: Self-pay | Admitting: Cardiology

## 2020-08-27 NOTE — Telephone Encounter (Signed)
° °  [facility] Pre-operative Risk Assessment    Request for surgical clearance:  1. What type of surgery is being performed? Cystoscopy Transurethral resection of a bladder tumor, ureteroscopy with possible biopsy, and stent placed  2. When is this surgery scheduled? TBD  3. What type of clearance is required (medical clearance vs. Pharmacy clearance to hold med vs. Both)? Both   4. Are there any medications that need to be held prior to surgery and how long?  XARELTO 20 MG TABS tablet held 3 days prior   5. Practice name and name of physician performing surgery? [facility] & Dr. Ellison Hughs  6. What is your office phone number [phone] ext [phone]   7.   What is your office fax number [phone]  8.   Anesthesia type (None, local, MAC, general) ? General    Kaitlyn Cornell 08/27/2020, 9:24 AM  _________________________________________________________________   (provider comments below)

## 2020-08-27 NOTE — Telephone Encounter (Signed)
Patient with diagnosis of afib on Xarelto for anticoagulation.    Procedure: Cystoscopy Transurethral resection of a bladder tumor, ureteroscopy with possible biopsy, and stent placed Date of procedure: TBD    CHA2DS2-VASc Score = 5  This indicates a 7.2% annual risk of stroke. The patient's score is based upon: CHF History: 1 HTN History: 1 Diabetes History: 0 Stroke History: 0 Vascular Disease History: 1 Age Score: 2 Gender Score: 0      CrCl 45 ml/min   Per office protocol, patient can hold Xarelto for 3 days prior to procedure.

## 2020-08-28 NOTE — Telephone Encounter (Signed)
Ok - good.  Too often, things get complicated with too many cooks in the kitchen.   Did not want you ro get stuck in that line of fire -- (being protective of my PAs).  Glenetta Hew, MD

## 2020-08-28 NOTE — Telephone Encounter (Signed)
Hey Dr. Ellyn Hack. Sorry, I did tell the patient and his daughter yesterday that we would be deferring diuresis to Nephrology and they completely understood. I did not get the impression that Nephrology wanted [place] to manage his diuresis. His daughter mainly just wanted to know what to do next if the increase in Torsemide did not work. She was onboard with going to the ED if needed. I just wanted to update you on what was going on.  Thanks! Adien Kimmel

## 2020-08-28 NOTE — Telephone Encounter (Signed)
Primary Cardiologist:David Ellyn Hack, MD  Chart reviewed as part of pre-operative protocol coverage. Because of Patrick Bishop's past medical history and time since last visit, he/she will require a follow-up visit in order to better assess preoperative cardiovascular risk.  Pre-op covering staff: - Please schedule appointment and call patient to inform them. - Please contact requesting surgeon's office via preferred method (i.e, phone, fax) to inform them of need for appointment prior to surgery.  If applicable, this message will also be routed to pharmacy pool and/or primary cardiologist for input on holding anticoagulant/antiplatelet agent as requested below so that this information is available at time of patient's appointment.   Patrick Pelton, NP  08/28/2020, 9:22 AM

## 2020-08-28 NOTE — Telephone Encounter (Signed)
Called and scheduled an appointment for 09-28-2020 @345pm  he will come early wearing a mask  Forwarded to requesting providers via [place] fax

## 2020-08-28 NOTE — Telephone Encounter (Signed)
This is a work comes difficult. The nephrologist regarding managing his diuretic then I really think they should be the ones answering the questions about his diuresis. I do agree that if the increase in dose of torsemide does not help, then the only way of getting in more diuresis for him to go to the hospital. However I think, that nephrology needs to be on board from the minute he gets there because I want them writing the orders on diuretics. If they are adjusting his meds, then they need to take responsibility -- > I.e. we need to forward these messages to his Nephrologist.   Glenetta Hew, MD

## 2020-09-14 DIAGNOSIS — Z961 Presence of intraocular lens: Secondary | ICD-10-CM | POA: Diagnosis not present

## 2020-09-28 ENCOUNTER — Ambulatory Visit (INDEPENDENT_AMBULATORY_CARE_PROVIDER_SITE_OTHER): Payer: Medicare Other | Admitting: Cardiology

## 2020-09-28 ENCOUNTER — Encounter: Payer: Self-pay | Admitting: Cardiology

## 2020-09-28 ENCOUNTER — Other Ambulatory Visit: Payer: Self-pay

## 2020-09-28 VITALS — BP 141/80 | HR 70 | Ht 73.0 in | Wt 323.0 lb

## 2020-09-28 DIAGNOSIS — I872 Venous insufficiency (chronic) (peripheral): Secondary | ICD-10-CM

## 2020-09-28 DIAGNOSIS — Z9989 Dependence on other enabling machines and devices: Secondary | ICD-10-CM

## 2020-09-28 DIAGNOSIS — G4733 Obstructive sleep apnea (adult) (pediatric): Secondary | ICD-10-CM | POA: Diagnosis not present

## 2020-09-28 DIAGNOSIS — I4821 Permanent atrial fibrillation: Secondary | ICD-10-CM | POA: Diagnosis not present

## 2020-09-28 DIAGNOSIS — Z01818 Encounter for other preprocedural examination: Secondary | ICD-10-CM | POA: Insufficient documentation

## 2020-09-28 DIAGNOSIS — I10 Essential (primary) hypertension: Secondary | ICD-10-CM

## 2020-09-28 DIAGNOSIS — I5032 Chronic diastolic (congestive) heart failure: Secondary | ICD-10-CM | POA: Diagnosis not present

## 2020-09-28 NOTE — Assessment & Plan Note (Signed)
Seen today for pre op urologic procedure clearance

## 2020-09-28 NOTE — Assessment & Plan Note (Signed)
He stopped C-pap after COVID pneumonia in Nov 2020

## 2020-09-28 NOTE — Telephone Encounter (Signed)
   Primary Cardiologist: Glenetta Hew, MD  Chart reviewed and patient seen in the office today as part of pre-operative protocol coverage. Given past medical history and time since last visit, based on ACC/AHA guidelines, Patrick Bishop would be at acceptable risk for the planned procedure without further cardiovascular testing.   Ok to hold Xarelto 3 days pre op- resume as soon as safe post op.  The patient was advised that if he develops new symptoms prior to surgery to contact our office to arrange for a follow-up visit, and he verbalized understanding.  I will route this recommendation to the requesting party via Epic fax function and remove from pre-op pool.  Please call with questions.  Kerin Ransom, PA-C 09/28/2020, 4:28 PM

## 2020-09-28 NOTE — Progress Notes (Signed)
Cardiology Office Note:    Date:  09/28/2020   ID:  Patrick Bishop, DOB [birth year], MRN [id number]  PCP:  Lujean Amel, MD  Cardiologist:  Glenetta Hew, MD  Electrophysiologist:  None   Referring MD: Lujean Amel, MD   No chief complaint on file. Pre op clearance  History of Present Illness:    Patrick Bishop is a 80 y.o. male with a hx of chronic atrial fibrillation, chronic renal insufficiency, morbid obesity, sleep apnea, and remote catheterization in 2011 showing nonobstructive coronary disease.  The patient has had issues with nephrolithiasis and required renal stenting in the past.  He is currently seeing Dr. Lovena Neighbours with [facility].  He is in the office today accompanied by his daughter for preop clearance.  He needs a cystoscopy.  He tells me he is not having any trouble voiding.  His weight is actually down 5 pounds from his last weight in September.  He is 323 pounds today.  He has massive lower extremity edema that is chronic.  He says this is unchanged.  He denies any increased SOB.  He also sees a nephrologist at [facility].  They have been adjusting his diuretics. He did have pneumonia related to CO VID in November 2020.  Since then he says he is been unable to tolerate CPAP.  He is followed by Dr. Vaughan Browner but has seen him in a year.   Past Medical History:  Diagnosis Date  . Arthritis   . CHF (congestive heart failure) ([place])   . Chronic diastolic heart failure (HCC)    Normal LV Fxn by Echo 04/2017   . Chronic venous insufficiency 2015   s/p Bilateral Iliac & Femoral Stents (including IVC) - DEEP Venous Reflux.  Also s/p Bilateral GSV Ablation.  [name] Dyspnea   . Dysrhythmia    a fib   . Essential hypertension   . History of kidney stones   . Hx of adenomatous colonic polyps   . Hyperlipidemia with target LDL less than 100   . Longstanding persistent atrial fibrillation ([place])   . Nephrolithiasis 05/2019  . Obesity   . OSA on CPAP    May need  New CPAP machine & CPAP MD)  . Pneumonia   . Pre-diabetes   . Pulmonary hypertension ([place])   . Pyelonephritis 05/2019  . Venous insufficiency    LE    Past Surgical History:  Procedure Laterality Date  . ABDOMINAL VENOUS DUPLEX Bilateral 07/2014   ([place]iology: Dr. Cleda Mccreedy): (prior to Bilateral Iliac-Femoral Venous Stent Placement): Patent IVC.  No DVT B/L Iliac or Com fem V..  +++ DEEP V REFLUX B/L ILIAC & FEM V.  Absent Superficial V Reflux B/L x L SSV (too tortuous for intervention).  BL GSV occluded - prior ablation.   . ABDOMINAL VENOUS DUPLEX Bilateral 11/2014   Absent DVT.  Left common femoral venous reflux: 1508 MS left common femoral vein, 2284MS left proximal femoral vein -> cystic structure noted in the left popliteal vein (7.6 x 1.4 x 3.8 cm).;  Right leg deep venous reflux noted in the distal iliac, common femoral, proximal profunda femoral, proximal superficial femoral, superficial femoral and popliteal vein.  [name] CARDIAC CATHETERIZATION  10/2009   [facility] - Dr. 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  EF~45%.  Mild pulmonary pretension.  [name] CARDIAC PET  08/2009   Inferolateral partially reversible defect -- > false positive by cath  . COLONOSCOPY    .  CYSTOSCOPY/URETEROSCOPY/HOLMIUM LASER/STENT PLACEMENT Left 11/06/2019   Procedure: CYSTOSCOPY/RETROGRADE/URETEROSCOPY/HOLMIUM LASER/STENT PLACEMENT. REMOVAL NEPHROSCOPYTUBE;  Surgeon: Ceasar Mons, MD;  Location: [facility];  Service: Urology;  Laterality: Left;  . ESOPHAGOGASTRODUODENOSCOPY    . GREATER SAPHENOUS VEIN ABLATION Bilateral Before 2015   [facility] (Dr. Eugenie Filler)  . IR NEPHROSTOMY EXCHANGE LEFT  08/28/2019  . IR NEPHROSTOMY PLACEMENT LEFT  06/24/2019  . IR TRANSCATH PLC STENT  EA ADD VEIN  INC ANGIOPLASTY Left 10/2014   [facility], Dr. 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 IR TRANSCATH PLC STENT  INITIAL VEIN  INC ANGIOPLASTY Right 08/2014   [facility] - Dr. Stormy Fabian): US Guided --> R Common & External Iliac, Common Femoral Venography with Placement of 24 mmx 70 mm Stent - R Com Iliac-Ext Iliac- & Com Fem V. reducing 60-80% venous compression to ~0%).;; also noted significant L-sided venous compression (staged stenting).   [name] LIPOMA EXCISION     x 2 came back again  . NM MYOVIEW LTD  11/2005   Adenosine Myoview [facility] - Dr. Cleda Mccreedy): Normal myocardial perfusion scan. No ischemia or infarction.  . TRANSTHORACIC ECHOCARDIOGRAM  04/26/2017    [facility] - Dr. Cleda Mccreedy) - report not available.  Per clinic note: Normal global LV function. Mild concentric temperature. Mild TR. Mild pulmonary hypertension.;;   . TRANSTHORACIC ECHOCARDIOGRAM  7/'15; 7/'17   [facility] - Dr. Saverio Danker) a) low normal LV function (EF 56%). Moderate concentric LVH. Mild LA dilation. Normal pulmonary pressures.  ;; b) normal global function - EF 60-65%. Moderate pulmonary hypertension. Biatrial enlargement. Moderate concentric LVH.  [name] TRANSTHORACIC ECHOCARDIOGRAM  02/2010   [facility] - Dr. Saverio Danker): Normal global LV function. Mild LVH. EF 60%.; November 2010. Moderate LV dilation. Moderate LVH. Normal EF 50-55%. Mild to moderate pulmonary hypertension. (July 2012 showed normal function with mild LVH, mild MR noted in August 2014)  . TRANSTHORACIC ECHOCARDIOGRAM  05/2019   During admission for UTI, sepsis with A. fib RVR: LV normal size and function.  EF 60-65%.  Mildly reduced RV size with severely reduced function.  Despite this normal atrial size noted with just only mild right atrial enlargement with elevated RAP (dilated IVC).  Relatively poor TR jet, but findings do suggest elevated RV  pressures.    Current Medications: Current Meds  Medication Sig  . atorvastatin (LIPITOR) 10 MG tablet Take 1 tablet (10 mg total) by mouth daily.  [name] HYDROcodone-acetaminophen (NORCO/VICODIN) 5-325 MG tablet Take 1 tablet by mouth 2 (two) times daily as needed for moderate pain.  . magnesium oxide (MAG-OX) 400 (241.3 Mg) MG tablet Take 1 tablet (400 mg total) by mouth daily.  . metoprolol tartrate (LOPRESSOR) 25 MG tablet TAKE 1 TABLET IN THE MORNING AND ONE AND ONE-HALF TABLETS (37.5 MG) IN THE EVENING  . potassium chloride SA (KLOR-CON) 20 MEQ tablet Take 1 tablet (20 mEq total) by mouth 2 (two) times daily.  . sildenafil (REVATIO) 20 MG tablet Take 20 mg by mouth daily.  Alveda Reasons 20 MG TABS tablet TAKE 1 TABLET DAILY WITH SUPPER     Allergies:   Aldactone [spironolactone]   Social History   Socioeconomic History  . Marital status: Widowed    Spouse name: Not on file  .  Number of children: 2  . Years of education: Not on file  . Highest education level: Some college, no degree  Occupational History  . 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 Occupation: Lobbyist: [employer]    Comment: followed by several odd jobs  . Occupation: Retired    Comment: After several odd jobs  Tobacco Use  . Smoking status: Former Smoker    Packs/day: 1.00    Years: 35.00    Pack years: 35.00    Types: Cigarettes    Quit date: 1996    Years since quitting: 26.0  . Smokeless tobacco: Never Used  . Tobacco comment: smoked from age [age]   Vaping Use  . Vaping Use: Never used  Substance and Sexual Activity  . Alcohol use: Yes    Comment: occasional  . Drug use: No  . Sexual activity: Not Currently  Other Topics Concern  . Not on file  Social History Narrative   Sly recently moved to [place] in January 2019.  This was to be close to his daughter who is here with him today.  He has 2 daughters and one grandchild.      Jacqualine Code,   [place] --PCP was Sherlynn Carbon, MD.  Cardiologist: Saverio Danker, MD (who followed him every 3 months with routine visits.  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   He currently lives at [place] [facility] (Dr. Albesa Seen) --however he hopes to be moving into his daughter's house once they have not established and ready for him.   Social Determinants of Health   Financial Resource Strain: Not on file  Food Insecurity: Not on file  Transportation Needs: Not on file  Physical Activity: Not on file  Stress: Not on file  Social Connections: Not on file     Family History: The patient's family history includes Cancer in his mother and sister; Heart attack (age of onset: 2) in his father; Hypertension in his brother. There is no history of Other.  ROS:   Please see the history of present illness.     All other systems reviewed and are negative.  EKGs/Labs/Other Studies Reviewed:    The following studies were reviewed today: Echo 06/25/2019- IMPRESSIONS    1. Left ventricular ejection fraction, by visual estimation, is 60 to  65%. The left ventricle has normal function. Normal left ventricular size.  There is mildly increased left ventricular hypertrophy.  2. Definity contrast agent was given IV to delineate the left ventricular  endocardial borders.  3. Global right ventricle has severely reduced systolic function.The  right ventricular size is mildly enlarged.  4. Left atrial size was normal.  5. Right atrial size was mildly dilated.  6. The mitral valve is normal in structure. Trace mitral valve  regurgitation. No evidence of mitral stenosis.  7. The tricuspid valve is normal in structure. Tricuspid valve  regurgitation is trivial.  8. The aortic valve is tricuspid Aortic valve regurgitation was not  visualized by color flow Doppler. Structurally normal aortic  valve, with  no evidence of sclerosis or stenosis.  9. The pulmonic valve was not well visualized. Pulmonic valve  regurgitation is not visualized by color flow Doppler.  10. Mildly elevated pulmonary artery systolic pressure.  11. The inferior vena cava is dilated in size with <50% respiratory  variability, suggesting right  atrial pressure of 15 mmHg.  12. Definity used; normal LV function; mild LVH; right heart not well  visualized but right atrium and ventricle appear to be dilated with  significant RV dysfunction.   EKG:  EKG is ordered today.  The ekg ordered today demonstrates AF with VR 70  Recent Labs: 01/20/2020: BNP 89.0 08/12/2020: BUN 28; Creatinine, Ser 1.99; Hemoglobin 11.3; Platelets 212; Potassium 4.2; Sodium 143  Recent Lipid Panel    Component Value Date/Time   TRIG 57 08/02/2019 2158    Physical Exam:    VS:  BP (!) 141/80   Pulse 70   Ht 6\' 1"  (1.854 m)   Wt (!) 323 lb (146.5 kg)   SpO2 98%   BMI 42.61 kg/m     Wt Readings from Last 3 Encounters:  09/28/20 (!) 323 lb (146.5 kg)  05/29/20 (!) 328 lb (148.8 kg)  02/27/20 (!) 338 lb 9.6 oz (153.6 kg)     GEN: Morbidly obese male,  in no acute distress HEENT: Normal NECK: No JVD CARDIAC: irregularly irregular, no murmurs, rubs, gallops RESPIRATORY:  Clear to auscultation without rales, wheezing or rhonchi  ABDOMEN: Soft, obese, non-distended MUSCULOSKELETAL:  3+ chronic LE edema; No deformity  SKIN: Warm and dry NEUROLOGIC:  Alert and oriented x 3 PSYCHIATRIC:  Normal affect   ASSESSMENT:    Pre-op evaluation Seen today for pre op urologic procedure clearance  Permanent atrial fibrillation (HCC); CHA2DS2-VASc score =5 (agex2, aortic plaque, CHF, HTN) Rate controlled- on Xarelto (OK to hold 3 days pre op)  Chronic diastolic HF (heart failure) (HCC) Currently compensated  Chronic venous insufficiency Chronic LE edema  OSA on CPAP He stopped C-pap after COVID pneumonia in Nov 2020  Morbid  obesity ([place]) BMI 42  PLAN:    The patient is an acceptable risk for the proposed procedure without further cardiac work-up.  I encouraged them to contact Dr. Vaughan Browner for follow-up after he recovers from his procedure. F/U Dr Ellyn Hack in March 2022.  Medication Adjustments/Labs and Tests Ordered: Current medicines are reviewed at length with the patient today.  Concerns regarding medicines are outlined above.  Orders Placed This Encounter  Procedures  . EKG 12-Lead   No orders of the defined types were placed in this encounter.   Patient Instructions  Medication Instructions:  No Changes *If you need a refill on your cardiac medications before your next appointment, please call your pharmacy*   Lab Work: No labs If you have labs (blood work) drawn today and your tests are completely normal, you will receive your results only by: [name] MyChart Message (if you have MyChart) OR . A paper copy in the mail If you have any lab test that is abnormal or we need to change your treatment, we will call you to review the results.   Testing/Procedures: No Testing   Follow-Up: At [facility], you and your health needs are our priority.  As part of our continuing mission to provide you with exceptional heart care, we have created designated Provider Care Teams.  These Care Teams include your primary Cardiologist (physician) and Advanced Practice Providers (APPs -  Physician Assistants and Nurse Practitioners) who all work together to provide you with the care you need, when you need it.     Your next appointment:   3 month(s)  The format for your next appointment:   In Person  Provider:   Glenetta Hew, MD       Signed, Kerin Ransom, PA-C  09/28/2020 4:23 PM    [facility]

## 2020-09-28 NOTE — Assessment & Plan Note (Signed)
Rate controlled- on Xarelto (OK to hold 3 days pre op)

## 2020-09-28 NOTE — Assessment & Plan Note (Signed)
Currently compensated 

## 2020-09-28 NOTE — Assessment & Plan Note (Signed)
Chronic LE edema 

## 2020-09-28 NOTE — Patient Instructions (Signed)
Medication Instructions:  No Changes *If you need a refill on your cardiac medications before your next appointment, please call your pharmacy*   Lab Work: No labs If you have labs (blood work) drawn today and your tests are completely normal, you will receive your results only by: [name] MyChart Message (if you have MyChart) OR . A paper copy in the mail If you have any lab test that is abnormal or we need to change your treatment, we will call you to review the results.   Testing/Procedures: No Testing   Follow-Up: At [facility], you and your health needs are our priority.  As part of our continuing mission to provide you with exceptional heart care, we have created designated Provider Care Teams.  These Care Teams include your primary Cardiologist (physician) and Advanced Practice Providers (APPs -  Physician Assistants and Nurse Practitioners) who all work together to provide you with the care you need, when you need it.     Your next appointment:   3 month(s)  The format for your next appointment:   In Person  Provider:   Glenetta Hew, MD

## 2020-09-28 NOTE — Assessment & Plan Note (Signed)
BMI 42 

## 2020-09-30 ENCOUNTER — Telehealth: Payer: Self-pay | Admitting: Cardiology

## 2020-09-30 NOTE — Telephone Encounter (Signed)
Pt c/o medication issue:  1. Name of Medication:   torsemide (DEMADEX) 20 MG tablet   2. How are you currently taking this medication (dosage and times per day)? As directed  3. Are you having a reaction (difficulty breathing--STAT)? no  4. What is your medication issue? Patient states that he's been having problems with his kindey's. His kidney doctor has told him to start taking 3 tablets in the AM and 2 in the PM. He wanted to let Dr. Ellyn Hack know. He will also need a refill of this medication sent in the [facility] with the new instructions listed. Please advise.

## 2020-09-30 NOTE — Telephone Encounter (Signed)
Returned the call to the patient. He stated that his Nephrologist made changes to his diuretics around three weeks ago.   He was on:  Metolazone 5 mg as needed for a weight gain over 2 pounds overnight  Torsemide 40 mg bid  His Nephrologist stopped his Metolazone and increased the Torsemide to 60 mg in the morning and 40 mg in the afternoon. This was done 3 weeks ago.   He had a follow up with cardiology on 09/28/20. He wants Dr. Ellyn Hack to refill the torsemide but has been advised that we will need to consult with him first due to the dosage change. He has been advised that he may need to call his Nephrologist for the refill.

## 2020-09-30 NOTE — Telephone Encounter (Signed)
The patient has been made aware and will call his Nephrologist.

## 2020-09-30 NOTE — Telephone Encounter (Signed)
I would defer management of this pat's diuretics to his nephrologist.  Kerin Ransom PA-C 09/30/2020 10:19 AM

## 2020-10-01 NOTE — Telephone Encounter (Signed)
I agree, I think the nephrologist should be the one writing the dose and prescriptions for diuretic.  Too complicated to have too many cooks in the kitchen.  Glenetta Hew, MD

## 2020-10-02 ENCOUNTER — Telehealth: Payer: Self-pay | Admitting: Cardiology

## 2020-10-02 NOTE — Telephone Encounter (Signed)
Returned call to patient. Per DPR, left detailed message stating that, per Dr. Ellyn Hack, management and refills of diuretic medication should be managed by the pt's nephrologist. Advised pt to contact our office with any questions.

## 2020-10-02 NOTE — Telephone Encounter (Signed)
     Pt c/o medication issue:  1. Name of Medication: furosemide 20 mg  2. How are you currently taking this medication (dosage and times per day)? 3 tablets in morning and 2 in the afternoon  3. Are you having a reaction (difficulty breathing--STAT)?   4. What is your medication issue? Pt said his kidney doctor advised him to take his furosemide again, currently the bottle he got from [name] is to take 2 tablet in the morning and 2 at night. His kidney doctor wants him to take 3 tablets in morning and 2 in the afternoon. He needs prescription to send to [place], [place] - [address]

## 2020-10-05 ENCOUNTER — Other Ambulatory Visit: Payer: Self-pay | Admitting: Urology

## 2020-10-06 ENCOUNTER — Telehealth: Payer: Self-pay | Admitting: Cardiology

## 2020-10-06 NOTE — Telephone Encounter (Signed)
New Message:     Please call, concerning his Torsemide.

## 2020-10-06 NOTE — Telephone Encounter (Signed)
Spoke with patient. Patient calling in because his urologist changed his diuretic dosage and he has ran out. He says he cannot have it filled until February. Advised patient to contact urology. Patient did not have their number. Number for [facility] given.

## 2020-10-12 ENCOUNTER — Other Ambulatory Visit: Payer: Self-pay | Admitting: Cardiology

## 2020-10-14 NOTE — Patient Instructions (Addendum)
DUE TO COVID-19 ONLY ONE VISITOR IS ALLOWED TO COME WITH YOU AND STAY IN THE WAITING ROOM ONLY DURING PRE OP AND PROCEDURE DAY OF SURGERY. THE 1 VISITOR  MAY VISIT WITH YOU AFTER SURGERY IN YOUR PRIVATE ROOM DURING VISITING HOURS ONLY!  YOU NEED TO HAVE A COVID 19 TEST ON__1/22_____ @__10 :45_____, THIS TEST MUST BE DONE BEFORE SURGERY,  COVID TESTING SITE [place] [id number], IT IS ON THE RIGHT GOING OUT [address] APPROXIMATELY  2 MINUTES PAST [employer] ON THE RIGHT. ONCE YOUR COVID TEST IS COMPLETED,  PLEASE BEGIN THE QUARANTINE INSTRUCTIONS AS OUTLINED IN YOUR HANDOUT.                [place]   Your procedure is scheduled on: 10/21/20   Report to [facility] Main  Entrance   Report to admitting at  12:00 PM      Call this number if you have problems the morning of surgery [phone]    Remember: Do not eat food After Midnight.  You may have clear liquid until 6:00 AM   BRUSH YOUR TEETH MORNING OF SURGERY AND RINSE YOUR MOUTH OUT, NO CHEWING GUM CANDY OR MINTS.     Take these medicines the morning of surgery with A SIP OF WATER: Metoprolol                                 You may not have any metal on your body including               piercings  Do not wear jewelry,  lotions, powders or deodorant                        Men may shave face and neck.   Do not bring valuables to the hospital. [place].  Contacts, dentures or bridgework may not be worn into surgery.      Patients discharged the day of surgery will not be allowed to drive home.   IF YOU ARE HAVING SURGERY AND GOING HOME THE SAME DAY, YOU MUST HAVE AN ADULT TO DRIVE YOU HOME AND BE WITH YOU FOR 24 HOURS.   YOU MAY GO HOME BY TAXI OR UBER OR ORTHERWISE, BUT AN ADULT MUST ACCOMPANY YOU HOME AND STAY WITH YOU FOR 24 HOURS.  Name and phone number of your driver:  Special Instructions: N/A              Please  read over the following fact sheets you were given: _____________________________________________________________________             [facility] - Preparing for Surgery Before surgery, you can play an important role.   Because skin is not sterile, your skin needs to be as free of germs as possible .  You can reduce the number of germs on your skin by washing with CHG (chlorahexidine gluconate) soap before surgery.   CHG is an antiseptic cleaner which kills germs and bonds with the skin to continue killing germs even after washing. Please DO NOT use if you have an allergy to CHG or antibacterial soaps.   If your skin becomes reddened/irritated stop using the CHG and inform your nurse when you arrive at Short Stay.  You may shave  your face/neck.  Please follow these instructions carefully:  1.  Shower with CHG Soap the night before surgery and the  morning of Surgery.  2.  If you choose to wash your hair, wash your hair first as usual with your  normal  shampoo.  3.  After you shampoo, rinse your hair and body thoroughly to remove the  shampoo.                                        4.  Use CHG as you would any other liquid soap.  You can apply chg directly  to the skin and wash                       Gently with a scrungie or clean washcloth.  5.  Apply the CHG Soap to your body ONLY FROM THE NECK DOWN.   Do not use on face/ open                           Wound or open sores. Avoid contact with eyes, ears mouth and genitals (private parts).                       Wash face,  Genitals (private parts) with your normal soap.             6.  Wash thoroughly, paying special attention to the area where your surgery  will be performed.  7.  Thoroughly rinse your body with warm water from the neck down.  8.  DO NOT shower/wash with your normal soap after using and rinsing off  the CHG Soap.             9.  Pat yourself dry with a clean towel.            10.  Wear clean pajamas.            11.   Place clean sheets on your bed the night of your first shower and do not  sleep with pets. Day of Surgery : Do not apply any lotions/deodorants the morning of surgery.  Please wear clean clothes to the hospital/surgery center.  FAILURE TO FOLLOW THESE INSTRUCTIONS MAY RESULT IN THE CANCELLATION OF YOUR SURGERY PATIENT SIGNATURE_________________________________  NURSE SIGNATURE__________________________________  ________________________________________________________________________

## 2020-10-15 ENCOUNTER — Other Ambulatory Visit: Payer: Self-pay

## 2020-10-15 ENCOUNTER — Encounter (HOSPITAL_COMMUNITY): Payer: Self-pay

## 2020-10-15 ENCOUNTER — Encounter (HOSPITAL_COMMUNITY)
Admission: RE | Admit: 2020-10-15 | Discharge: 2020-10-15 | Disposition: A | Discharge status: Home / Self Care | Payer: Medicare Other | Source: Ambulatory Visit | Attending: Urology | Admitting: Urology

## 2020-10-15 DIAGNOSIS — I4891 Unspecified atrial fibrillation: Secondary | ICD-10-CM | POA: Insufficient documentation

## 2020-10-15 DIAGNOSIS — I739 Peripheral vascular disease, unspecified: Secondary | ICD-10-CM | POA: Insufficient documentation

## 2020-10-15 DIAGNOSIS — R7303 Prediabetes: Secondary | ICD-10-CM | POA: Insufficient documentation

## 2020-10-15 DIAGNOSIS — I130 Hypertensive heart and chronic kidney disease with heart failure and stage 1 through stage 4 chronic kidney disease, or unspecified chronic kidney disease: Secondary | ICD-10-CM | POA: Insufficient documentation

## 2020-10-15 DIAGNOSIS — N183 Chronic kidney disease, stage 3 unspecified: Secondary | ICD-10-CM | POA: Diagnosis not present

## 2020-10-15 DIAGNOSIS — Z01818 Encounter for other preprocedural examination: Secondary | ICD-10-CM | POA: Diagnosis not present

## 2020-10-15 DIAGNOSIS — G4733 Obstructive sleep apnea (adult) (pediatric): Secondary | ICD-10-CM | POA: Insufficient documentation

## 2020-10-15 DIAGNOSIS — Z87891 Personal history of nicotine dependence: Secondary | ICD-10-CM | POA: Diagnosis not present

## 2020-10-15 DIAGNOSIS — I5032 Chronic diastolic (congestive) heart failure: Secondary | ICD-10-CM | POA: Insufficient documentation

## 2020-10-15 DIAGNOSIS — Z79899 Other long term (current) drug therapy: Secondary | ICD-10-CM | POA: Diagnosis not present

## 2020-10-15 DIAGNOSIS — R8271 Bacteriuria: Secondary | ICD-10-CM | POA: Diagnosis not present

## 2020-10-15 DIAGNOSIS — I082 Rheumatic disorders of both aortic and tricuspid valves: Secondary | ICD-10-CM | POA: Insufficient documentation

## 2020-10-15 DIAGNOSIS — Z7901 Long term (current) use of anticoagulants: Secondary | ICD-10-CM | POA: Insufficient documentation

## 2020-10-15 HISTORY — DX: Peripheral vascular disease, unspecified: I73.9

## 2020-10-15 LAB — CBC
HCT: 37 % — ABNORMAL LOW (ref 39.0–52.0)
Hemoglobin: 12 g/dL — ABNORMAL LOW (ref 13.0–17.0)
MCH: 30.4 pg (ref 26.0–34.0)
MCHC: 32.4 g/dL (ref 30.0–36.0)
MCV: 93.7 fL (ref 80.0–100.0)
Platelets: 208 10*3/uL (ref 150–400)
RBC: 3.95 MIL/uL — ABNORMAL LOW (ref 4.22–5.81)
RDW: 13.5 % (ref 11.5–15.5)
WBC: 6.4 10*3/uL (ref 4.0–10.5)
nRBC: 0 % (ref 0.0–0.2)

## 2020-10-15 NOTE — Progress Notes (Signed)
COVID Vaccine Completed:Yes Date COVID Vaccine completed:10/29/19 COVID vaccine manufacturer:   Moderna    PCP - Dr D. Dorthy Cooler Cardiologist - Dr. Roni Bread  Chest x-ray - no EKG - 09/28/20-epic Stress Test - no ECHO - 2020 Cardiac Cath - 2011 Pacemaker/ICD device last checked:NA  Sleep Study - yes CPAP - not after having Covid  Fasting Blood Sugar - NA Checks Blood Sugar _____ times a day  Blood Thinner Instructions:Xarelto for A-fib/ Dr. Ellyn Hack Aspirin Instructions:Stop 3 days prior to DOS/ Ellyn Hack Last Dose:10/17/20  Anesthesia review:   Patient denies shortness of breath, fever, cough and chest pain at PAT appointment yes  Patient verbalized understanding of instructions that were given to them at the PAT appointment. Patient was also instructed that they will need to review over the PAT instructions again at home before surgery. Yes. Pt uses cane around the house. He has venous insufficiency and  Peripheral edema but no weeping at the moment. He reports some SOB with ADLs but that is baseline.

## 2020-10-16 LAB — HEMOGLOBIN A1C
Hgb A1c MFr Bld: 5.4 % (ref 4.8–5.6)
Mean Plasma Glucose: 108.28 mg/dL

## 2020-10-17 ENCOUNTER — Inpatient Hospital Stay (HOSPITAL_COMMUNITY): Admission: RE | Admit: 2020-10-17 | Payer: Medicare Other | Source: Ambulatory Visit

## 2020-10-19 NOTE — Anesthesia Preprocedure Evaluation (Addendum)
Anesthesia Evaluation  Patient identified by MRN, date of birth, ID band Patient awake    Reviewed: Allergy & Precautions, NPO status , Patient's Chart, lab work & pertinent test results, reviewed documented beta blocker date and time   History of Anesthesia Complications Negative for: history of anesthetic complications  Airway Mallampati: II  TM Distance: >3 FB Neck ROM: Full    Dental  (+) Edentulous Upper, Edentulous Lower   Pulmonary sleep apnea and Continuous Positive Airway Pressure Ventilation , former smoker,    Pulmonary exam normal        Cardiovascular hypertension, Pt. on medications and Pt. on home beta blockers + Peripheral Vascular Disease and +CHF  + dysrhythmias Atrial Fibrillation  Rhythm:Irregular Rate:Normal     Neuro/Psych negative neurological ROS  negative psych ROS   GI/Hepatic negative GI ROS, Neg liver ROS,   Endo/Other  Morbid obesity Pre-DM   Renal/GU CRFRenal disease     Musculoskeletal  (+) Arthritis ,   Abdominal   Peds  Hematology  On xarelto    Anesthesia Other Findings Hx Covid+ Pneumonia 07/2019 Covid test negative    Reproductive/Obstetrics                           Anesthesia Physical Anesthesia Plan  ASA: III  Anesthesia Plan: General   Post-op Pain Management:    Induction: Intravenous  PONV Risk Score and Plan: 2 and Treatment may vary due to age or medical condition and Ondansetron  Airway Management Planned: LMA  Additional Equipment: None  Intra-op Plan:   Post-operative Plan: Extubation in OR  Informed Consent: I have reviewed the patients History and Physical, chart, labs and discussed the procedure including the risks, benefits and alternatives for the proposed anesthesia with the patient or authorized representative who has indicated his/her understanding and acceptance.     Dental advisory given  Plan Discussed with:  CRNA and Anesthesiologist  Anesthesia Plan Comments:       Anesthesia Quick Evaluation

## 2020-10-19 NOTE — Progress Notes (Signed)
Anesthesia Chart Review   Case: [id number] Date/Time: 10/21/20 1345   Procedure: TRANSURETHRAL RESECTION OF BLADDER TUMOR (TURBT)/ RETROGRADE/ DIAGNOSOTIC URETEROSCOPY WITH POSSIBLE BIOPSY, URETERAL STENT PLACEMENT (Left )   Anesthesia type: Choice   Pre-op diagnosis: POSSIBLE BLADDER MASS, LEFT HYDRONEPHROSIS   Location: [facility] ROOM 03 / [facility]   Surgeons: Ceasar Mons, MD      DISCUSSION:79 y.o. former smoker with h/o HTN, OSA on CPAP, CHF, A-fib (Xarelto), PVD, possible bladder mass, left hydronephrosis scheduled for above procedure 10/21/2020 with Dr. Harrell Gave Lovena Neighbours.   Pt last seen by cardiology 09/28/2020. Per OV note, "The patient is an acceptable risk for the proposed procedure without further cardiac work-up.  I encouraged them to contact Dr. Vaughan Browner for follow-up after he recovers from his procedure. F/U Dr Ellyn Hack in March 2022." Advised to hold Xarelto 3 days prior to procedure.   Anticipate pt can proceed with planned procedure barring acute status change.   VS: BP (!) 147/87   Pulse 70   Temp 36.7 C (Oral)   Resp 20   SpO2 99%   PROVIDERS: [name], [name], MD is PCP  Glenetta Hew, MD is Cardiologist  LABS: Labs reviewed: Acceptable for surgery. (all labs ordered are listed, but only abnormal results are displayed)  Labs Reviewed  CBC - Abnormal; Notable for the following components:      Result Value   RBC 3.95 (*)    Hemoglobin 12.0 (*)    HCT 37.0 (*)    All other components within normal limits  HEMOGLOBIN A1C     IMAGES:   EKG: 09/28/2020 Rate 70 bpm  Atrial fibrillation  Low voltage QRS  CV: Echo 06/25/2019 IMPRESSIONS    1. Left ventricular ejection fraction, by visual estimation, is 60 to  65%. The left ventricle has normal function. Normal left ventricular size.  There is mildly increased left ventricular hypertrophy.  2. Definity contrast agent was given IV to delineate the left ventricular  endocardial borders.  3. Global  right ventricle has severely reduced systolic function.The  right ventricular size is mildly enlarged.  4. Left atrial size was normal.  5. Right atrial size was mildly dilated.  6. The mitral valve is normal in structure. Trace mitral valve  regurgitation. No evidence of mitral stenosis.  7. The tricuspid valve is normal in structure. Tricuspid valve  regurgitation is trivial.  8. The aortic valve is tricuspid Aortic valve regurgitation was not  visualized by color flow Doppler. Structurally normal aortic valve, with  no evidence of sclerosis or stenosis.  9. The pulmonic valve was not well visualized. Pulmonic valve  regurgitation is not visualized by color flow Doppler.  10. Mildly elevated pulmonary artery systolic pressure.  11. The inferior vena cava is dilated in size with <50% respiratory  variability, suggesting right atrial pressure of 15 mmHg.  12. Definity used; normal LV function; mild LVH; right heart not well  visualized but right atrium and ventricle appear to be dilated with  significant RV dysfunction. Past Medical History:  Diagnosis Date  . Arthritis   . CHF (congestive heart failure) ([place])   . Chronic diastolic heart failure (HCC)    Normal LV Fxn by Echo 04/2017   . Chronic venous insufficiency 2015   s/p Bilateral Iliac & Femoral Stents (including IVC) - DEEP Venous Reflux.  Also s/p Bilateral GSV Ablation.  [name] Dyspnea   . Dysrhythmia    a fib   . Essential hypertension   . History of kidney  stones   . Hx of adenomatous colonic polyps   . Hyperlipidemia with target LDL less than 100   . Longstanding persistent atrial fibrillation ([place])   . Nephrolithiasis 05/2019   CKD stage 3  . Obesity   . OSA on CPAP    May need New CPAP machine & CPAP MD)  . Peripheral vascular disease (HCC)    venous insufficiency in legs  . Pneumonia 08/2019  . Pre-diabetes   . Pulmonary hypertension ([place])   . Pyelonephritis 05/2019  . Venous insufficiency    LE     Past Surgical History:  Procedure Laterality Date  . ABDOMINAL VENOUS DUPLEX Bilateral 07/2014   ([place]. Cardiology: Dr. Cleda Mccreedy): (prior to Bilateral Iliac-Femoral Venous Stent Placement): Patent IVC.  No DVT B/L Iliac or Com fem V..  +++ DEEP V REFLUX B/L ILIAC & FEM V.  Absent Superficial V Reflux B/L x L SSV (too tortuous for intervention).  BL GSV occluded - prior ablation.   . ABDOMINAL VENOUS DUPLEX Bilateral 11/2014   Absent DVT.  Left common femoral venous reflux: 1508 MS left common femoral vein, 2284MS left proximal femoral vein -> cystic structure noted in the left popliteal vein (7.6 x 1.4 x 3.8 cm).;  Right leg deep venous reflux noted in the distal iliac, common femoral, proximal profunda femoral, proximal superficial femoral, superficial femoral and popliteal vein.  [name] CARDIAC CATHETERIZATION  10/2009   [facility] Cardiology - Dr. 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  EF~45%.  Mild pulmonary pretension.  [name] CARDIAC PET  08/2009   Inferolateral partially reversible defect -- > false positive by cath  . COLONOSCOPY    . CYSTOSCOPY/URETEROSCOPY/HOLMIUM LASER/STENT PLACEMENT Left 11/06/2019   Procedure: CYSTOSCOPY/RETROGRADE/URETEROSCOPY/HOLMIUM LASER/STENT PLACEMENT. REMOVAL NEPHROSCOPYTUBE;  Surgeon: Ceasar Mons, MD;  Location: [facility];  Service: Urology;  Laterality: Left;  . ESOPHAGOGASTRODUODENOSCOPY    . GREATER SAPHENOUS VEIN ABLATION Bilateral Before 2015   [facility] (Dr. Eugenie Filler)  . IR NEPHROSTOMY EXCHANGE LEFT  08/28/2019  . IR NEPHROSTOMY PLACEMENT LEFT  06/24/2019  . IR TRANSCATH PLC STENT  EA ADD VEIN  INC ANGIOPLASTY Left 10/2014   [facility], Dr. 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  IR TRANSCATH PLC STENT  INITIAL VEIN  INC ANGIOPLASTY Right 08/2014   [facility] - Dr. Stormy Fabian): US Guided --> R Common & External Iliac, Common Femoral Venography with Placement of 24 mmx 70 mm Stent - R Com Iliac-Ext Iliac- & Com Fem V. reducing 60-80% venous compression to ~0%).;; also noted significant L-sided venous compression (staged stenting).   [name] LIPOMA EXCISION     x 2 came back again  . NM MYOVIEW LTD  11/2005   Adenosine Myoview [facility] Cardiology - Dr. Cleda Mccreedy): Normal myocardial perfusion scan. No ischemia or infarction.  . TRANSTHORACIC ECHOCARDIOGRAM  04/26/2017    [facility] Cardiology - Dr. Cleda Mccreedy) - report not available.  Per clinic note: Normal global LV function. Mild concentric temperature. Mild TR. Mild pulmonary hypertension.;;   . TRANSTHORACIC ECHOCARDIOGRAM  7/'15; 7/'17   [facility] Cardiology - Dr. Saverio Danker) a) low normal LV function (EF 56%). Moderate concentric LVH. Mild LA dilation. Normal pulmonary pressures.  ;; b) normal global function -  EF 60-65%. Moderate pulmonary hypertension. Biatrial enlargement. Moderate concentric LVH.  [name] TRANSTHORACIC ECHOCARDIOGRAM  02/2010   [facility] Cardiology - Dr. Saverio Danker): Normal global LV function. Mild LVH. EF 60%.; November 2010. Moderate LV dilation. Moderate LVH. Normal EF 50-55%. Mild to moderate pulmonary hypertension. (July 2012 showed normal function with mild LVH, mild MR noted in August 2014)  . TRANSTHORACIC ECHOCARDIOGRAM  05/2019   During admission for UTI, sepsis with A. fib RVR: LV normal size and function.  EF 60-65%.  Mildly reduced RV size with severely reduced function.  Despite this normal atrial size noted with just only mild right atrial enlargement with elevated RAP (dilated IVC).  Relatively poor TR jet, but findings do suggest elevated RV pressures.    MEDICATIONS: . atorvastatin (LIPITOR) 10 MG tablet  . HYDROcodone-acetaminophen (NORCO/VICODIN) 5-325 MG tablet  . magnesium oxide (MAG-OX)  400 (241.3 Mg) MG tablet  . metoprolol tartrate (LOPRESSOR) 25 MG tablet  . potassium chloride SA (KLOR-CON) 20 MEQ tablet  . torsemide (DEMADEX) 20 MG tablet  . XARELTO 20 MG TABS tablet   No current facility-administered medications for this encounter.   Konrad Felix, PA-C [facility] Pre-Surgical Testing [phone]

## 2020-10-20 ENCOUNTER — Other Ambulatory Visit (HOSPITAL_COMMUNITY)
Admission: RE | Admit: 2020-10-20 | Discharge: 2020-10-20 | Disposition: A | Discharge status: Home / Self Care | Payer: Medicare Other | Source: Ambulatory Visit | Attending: Urology | Admitting: Urology

## 2020-10-20 DIAGNOSIS — Z01812 Encounter for preprocedural laboratory examination: Secondary | ICD-10-CM | POA: Diagnosis not present

## 2020-10-20 DIAGNOSIS — M169 Osteoarthritis of hip, unspecified: Secondary | ICD-10-CM | POA: Diagnosis not present

## 2020-10-20 DIAGNOSIS — Z20822 Contact with and (suspected) exposure to covid-19: Secondary | ICD-10-CM | POA: Insufficient documentation

## 2020-10-20 DIAGNOSIS — G894 Chronic pain syndrome: Secondary | ICD-10-CM | POA: Diagnosis not present

## 2020-10-20 DIAGNOSIS — I739 Peripheral vascular disease, unspecified: Secondary | ICD-10-CM | POA: Diagnosis not present

## 2020-10-20 DIAGNOSIS — M1710 Unilateral primary osteoarthritis, unspecified knee: Secondary | ICD-10-CM | POA: Diagnosis not present

## 2020-10-20 LAB — SARS CORONAVIRUS 2 (TAT 6-24 HRS): SARS Coronavirus 2: NEGATIVE

## 2020-10-21 ENCOUNTER — Encounter (HOSPITAL_COMMUNITY): Payer: Self-pay | Admitting: Urology

## 2020-10-21 ENCOUNTER — Other Ambulatory Visit: Payer: Self-pay

## 2020-10-21 ENCOUNTER — Encounter (HOSPITAL_COMMUNITY)
Admission: RE | Disposition: A | Discharge status: Home / Self Care | Payer: Self-pay | Source: Home / Self Care | Attending: Urology

## 2020-10-21 ENCOUNTER — Ambulatory Visit (HOSPITAL_COMMUNITY)
Admission: RE | Admit: 2020-10-21 | Discharge: 2020-10-21 | Disposition: A | Discharge status: Home / Self Care | Payer: Medicare Other | Attending: Urology | Admitting: Urology

## 2020-10-21 ENCOUNTER — Ambulatory Visit (HOSPITAL_COMMUNITY): Payer: Medicare Other | Admitting: Anesthesiology

## 2020-10-21 ENCOUNTER — Ambulatory Visit (HOSPITAL_COMMUNITY): Payer: Medicare Other

## 2020-10-21 ENCOUNTER — Ambulatory Visit (HOSPITAL_COMMUNITY): Payer: Medicare Other | Admitting: Physician Assistant

## 2020-10-21 DIAGNOSIS — C676 Malignant neoplasm of ureteric orifice: Secondary | ICD-10-CM | POA: Diagnosis not present

## 2020-10-21 DIAGNOSIS — Z87891 Personal history of nicotine dependence: Secondary | ICD-10-CM | POA: Insufficient documentation

## 2020-10-21 DIAGNOSIS — Z86010 Personal history of colonic polyps: Secondary | ICD-10-CM | POA: Diagnosis not present

## 2020-10-21 DIAGNOSIS — N1330 Unspecified hydronephrosis: Secondary | ICD-10-CM | POA: Diagnosis not present

## 2020-10-21 DIAGNOSIS — N3289 Other specified disorders of bladder: Secondary | ICD-10-CM | POA: Diagnosis not present

## 2020-10-21 DIAGNOSIS — I110 Hypertensive heart disease with heart failure: Secondary | ICD-10-CM | POA: Diagnosis not present

## 2020-10-21 DIAGNOSIS — Z888 Allergy status to other drugs, medicaments and biological substances status: Secondary | ICD-10-CM | POA: Diagnosis not present

## 2020-10-21 DIAGNOSIS — Z95828 Presence of other vascular implants and grafts: Secondary | ICD-10-CM | POA: Diagnosis not present

## 2020-10-21 DIAGNOSIS — D494 Neoplasm of unspecified behavior of bladder: Secondary | ICD-10-CM | POA: Diagnosis not present

## 2020-10-21 DIAGNOSIS — N329 Bladder disorder, unspecified: Secondary | ICD-10-CM | POA: Insufficient documentation

## 2020-10-21 DIAGNOSIS — I5032 Chronic diastolic (congestive) heart failure: Secondary | ICD-10-CM | POA: Diagnosis not present

## 2020-10-21 HISTORY — PX: TRANSURETHRAL RESECTION OF BLADDER TUMOR: SHX2575

## 2020-10-21 LAB — COMPREHENSIVE METABOLIC PANEL
ALT: 11 U/L (ref 0–44)
AST: 13 U/L — ABNORMAL LOW (ref 15–41)
Albumin: 3.6 g/dL (ref 3.5–5.0)
Alkaline Phosphatase: 64 U/L (ref 38–126)
Anion gap: 9 (ref 5–15)
BUN: 36 mg/dL — ABNORMAL HIGH (ref 8–23)
CO2: 25 mmol/L (ref 22–32)
Calcium: 8.8 mg/dL — ABNORMAL LOW (ref 8.9–10.3)
Chloride: 108 mmol/L (ref 98–111)
Creatinine, Ser: 1.91 mg/dL — ABNORMAL HIGH (ref 0.61–1.24)
GFR, Estimated: 35 mL/min — ABNORMAL LOW (ref >60)
Glucose, Bld: 101 mg/dL — ABNORMAL HIGH (ref 70–99)
Potassium: 4.8 mmol/L (ref 3.5–5.1)
Sodium: 142 mmol/L (ref 135–145)
Total Bilirubin: 1 mg/dL (ref 0.3–1.2)
Total Protein: 7.2 g/dL (ref 6.5–8.1)

## 2020-10-21 SURGERY — TURBT (TRANSURETHRAL RESECTION OF BLADDER TUMOR)
Anesthesia: General | Laterality: Left

## 2020-10-21 MED ORDER — CEPHALEXIN 500 MG PO CAPS: 500.0000 mg | ORAL_CAPSULE | Freq: Two times a day (BID) | ORAL | 0 refills | Status: AC

## 2020-10-21 MED ORDER — LACTATED RINGERS IV SOLN: INTRAVENOUS | Status: DC

## 2020-10-21 MED ORDER — ONDANSETRON HCL 4 MG/2ML IJ SOLN
INTRAMUSCULAR | Status: DC | PRN
  Administered 2020-10-21: 4 mg via INTRAVENOUS

## 2020-10-21 MED ORDER — OXYCODONE HCL 5 MG PO TABS: 5.0000 mg | ORAL_TABLET | Freq: Once | ORAL | Status: DC | PRN

## 2020-10-21 MED ORDER — LIDOCAINE HCL 1 % IJ SOLN
INTRAMUSCULAR | Status: DC | PRN
  Administered 2020-10-21: 50 mg via INTRADERMAL

## 2020-10-21 MED ORDER — FENTANYL CITRATE (PF) 100 MCG/2ML IJ SOLN
INTRAMUSCULAR | Status: AC
  Filled 2020-10-21: qty 2

## 2020-10-21 MED ORDER — ONDANSETRON HCL 4 MG/2ML IJ SOLN: 4.0000 mg | Freq: Once | INTRAMUSCULAR | Status: DC | PRN

## 2020-10-21 MED ORDER — CHLORHEXIDINE GLUCONATE 0.12 % MT SOLN: 15.0000 mL | Freq: Once | OROMUCOSAL | Status: DC

## 2020-10-21 MED ORDER — FENTANYL CITRATE (PF) 100 MCG/2ML IJ SOLN
INTRAMUSCULAR | Status: DC | PRN
  Administered 2020-10-21 (×4): 25 ug via INTRAVENOUS

## 2020-10-21 MED ORDER — ONDANSETRON HCL 4 MG/2ML IJ SOLN
INTRAMUSCULAR | Status: AC
  Filled 2020-10-21: qty 2

## 2020-10-21 MED ORDER — LIDOCAINE HCL (PF) 2 % IJ SOLN
INTRAMUSCULAR | Status: AC
  Filled 2020-10-21: qty 5

## 2020-10-21 MED ORDER — OXYBUTYNIN CHLORIDE 5 MG PO TABS: 5.0000 mg | ORAL_TABLET | Freq: Three times a day (TID) | ORAL | 1 refills | Status: DC | PRN

## 2020-10-21 MED ORDER — DEXAMETHASONE SODIUM PHOSPHATE 10 MG/ML IJ SOLN
INTRAMUSCULAR | Status: AC
  Filled 2020-10-21: qty 1

## 2020-10-21 MED ORDER — SODIUM CHLORIDE 0.9 % IR SOLN
Status: DC | PRN
  Administered 2020-10-21: 9000 mL

## 2020-10-21 MED ORDER — PHENAZOPYRIDINE HCL 200 MG PO TABS: 200.0000 mg | ORAL_TABLET | Freq: Three times a day (TID) | ORAL | 0 refills | Status: DC | PRN

## 2020-10-21 MED ORDER — IOHEXOL 300 MG/ML  SOLN
INTRAMUSCULAR | Status: DC | PRN
  Administered 2020-10-21: 20 mL

## 2020-10-21 MED ORDER — OXYCODONE HCL 5 MG/5ML PO SOLN
5.0000 mg | Freq: Once | ORAL | Status: DC | PRN
Start: 2020-10-21 — End: 2020-10-21

## 2020-10-21 MED ORDER — ORAL CARE MOUTH RINSE: 15.0000 mL | Freq: Once | OROMUCOSAL | Status: DC

## 2020-10-21 MED ORDER — PROPOFOL 10 MG/ML IV BOLUS
INTRAVENOUS | Status: DC | PRN
  Administered 2020-10-21: 60 mg via INTRAVENOUS
  Administered 2020-10-21: 140 mg via INTRAVENOUS

## 2020-10-21 MED ORDER — TRAMADOL HCL 50 MG PO TABS: 50.0000 mg | ORAL_TABLET | Freq: Four times a day (QID) | ORAL | 0 refills | Status: AC | PRN

## 2020-10-21 MED ORDER — CIPROFLOXACIN IN D5W 400 MG/200ML IV SOLN
400.0000 mg | Freq: Once | INTRAVENOUS | Status: AC
  Administered 2020-10-21: 400 mg via INTRAVENOUS
  Filled 2020-10-21: qty 200

## 2020-10-21 MED ORDER — PHENYLEPHRINE HCL (PRESSORS) 10 MG/ML IV SOLN
INTRAVENOUS | Status: DC | PRN
  Administered 2020-10-21 (×2): 40 ug via INTRAVENOUS
  Administered 2020-10-21: 120 ug via INTRAVENOUS

## 2020-10-21 MED ORDER — FENTANYL CITRATE (PF) 100 MCG/2ML IJ SOLN: 25.0000 ug | INTRAMUSCULAR | Status: DC | PRN

## 2020-10-21 SURGICAL SUPPLY — 20 items
BAG URINE DRAIN 2000ML AR STRL (UROLOGICAL SUPPLIES) ×2 IMPLANT
BAG URO CATCHER STRL LF (MISCELLANEOUS) ×2 IMPLANT
CATH FOLEY 2WAY SLVR  5CC 20FR (CATHETERS) ×1
CATH FOLEY 2WAY SLVR 5CC 20FR (CATHETERS) ×1 IMPLANT
CATH URETL 5X70 OPEN END (CATHETERS) ×2 IMPLANT
GLOVE SURG ENC TEXT LTX SZ7.5 (GLOVE) ×2 IMPLANT
GOWN STRL REUS W/ TWL LRG LVL3 (GOWN DISPOSABLE) ×2 IMPLANT
GOWN STRL REUS W/TWL LRG LVL3 (GOWN DISPOSABLE) ×2
GOWN STRL REUS W/TWL XL LVL3 (GOWN DISPOSABLE) ×2 IMPLANT
GUIDEWIRE ZIPWRE .038 STRAIGHT (WIRE) ×2 IMPLANT
KIT TURNOVER KIT A (KITS) IMPLANT
LOOP CUT BIPOLAR 24F LRG (ELECTROSURGICAL) ×2 IMPLANT
MANIFOLD NEPTUNE II (INSTRUMENTS) ×2 IMPLANT
PACK CYSTO (CUSTOM PROCEDURE TRAY) ×2 IMPLANT
STENT URET 6FRX24 CONTOUR (STENTS) ×2 IMPLANT
STENT URET 6FRX26 CONTOUR (STENTS) ×2 IMPLANT
SYR TOOMEY IRRIG 70ML (MISCELLANEOUS) ×2
SYRINGE TOOMEY IRRIG 70ML (MISCELLANEOUS) ×1 IMPLANT
TUBING CONNECTING 10 (TUBING) ×2 IMPLANT
TUBING UROLOGY SET (TUBING) IMPLANT

## 2020-10-21 NOTE — Transfer of Care (Signed)
Immediate Anesthesia Transfer of Care Note  Patient: Patrick Bishop  Procedure(s) Performed: TRANSURETHRAL RESECTION OF BLADDER TUMOR (TURBT)/ RETROGRADE/  URETERAL STENT PLACEMENT (Left )  Patient Location: PACU  Anesthesia Type:General  Level of Consciousness: drowsy  Airway & Oxygen Therapy: Patient Spontanous Breathing  Post-op Assessment: Report given to RN and Post -op Vital signs reviewed and stable  Post vital signs: Reviewed and stable  Last Vitals:  Vitals Value Taken Time  BP 121/73 10/21/20 1503  Temp    Pulse 65 10/21/20 1504  Resp 17 10/21/20 1504  SpO2 92 % 10/21/20 1504  Vitals shown include unvalidated device data.  Last Pain:  Vitals:   10/21/20 1215  TempSrc:   PainSc: 4          Complications: No complications documented.

## 2020-10-21 NOTE — H&P (Signed)
Urology Preoperative H&P   Chief Complaint: Bladder mass with left hydronephrosis  History of Present Illness: Patrick Bishop is a 80 y.o. male who suffered a fall from standing in November 2021.  He was evaluated by CT without contrast and was found to have a bladder lesion with concomitant left-sided hydronephrosis.  The patient had not been having any bothersome left-sided pain or discomfort. He denies any burning or painful urination, visible blood in the urine. Voiding symptoms grossly stable per his report. He does have intermittent periods of urge incontinence which is largely related to his immobility.   Past Medical History:  Diagnosis Date  . Arthritis   . CHF (congestive heart failure) ([place])   . Chronic diastolic heart failure (HCC)    Normal LV Fxn by Echo 04/2017   . Chronic venous insufficiency 2015   s/p Bilateral Iliac & Femoral Stents (including IVC) - DEEP Venous Reflux.  Also s/p Bilateral GSV Ablation.  [name] Dyspnea   . Dysrhythmia    a fib   . Essential hypertension   . History of kidney stones   . Hx of adenomatous colonic polyps   . Hyperlipidemia with target LDL less than 100   . Longstanding persistent atrial fibrillation ([place])   . Nephrolithiasis 05/2019   CKD stage 3  . Obesity   . OSA on CPAP    May need New CPAP machine & CPAP MD)  . Peripheral vascular disease (HCC)    venous insufficiency in legs  . Pneumonia 08/2019  . Pre-diabetes   . Pulmonary hypertension ([place])   . Pyelonephritis 05/2019  . Venous insufficiency    LE    Past Surgical History:  Procedure Laterality Date  . ABDOMINAL VENOUS DUPLEX Bilateral 07/2014   ([place]. Cardiology: Dr. Cleda Mccreedy): (prior to Bilateral Iliac-Femoral Venous Stent Placement): Patent IVC.  No DVT B/L Iliac or Com fem V..  +++ DEEP V REFLUX B/L ILIAC & FEM V.  Absent Superficial V Reflux B/L x L SSV (too tortuous for intervention).  BL GSV occluded - prior ablation.   . ABDOMINAL VENOUS DUPLEX Bilateral 11/2014    Absent DVT.  Left common femoral venous reflux: 1508 MS left common femoral vein, 2284MS left proximal femoral vein -> cystic structure noted in the left popliteal vein (7.6 x 1.4 x 3.8 cm).;  Right leg deep venous reflux noted in the distal iliac, common femoral, proximal profunda femoral, proximal superficial femoral, superficial femoral and popliteal vein.  [name] CARDIAC CATHETERIZATION  10/2009   [facility] - Dr. 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  EF~45%.  Mild pulmonary pretension.  [name] CARDIAC PET  08/2009   Inferolateral partially reversible defect -- > false positive by cath  . COLONOSCOPY    . CYSTOSCOPY/URETEROSCOPY/HOLMIUM LASER/STENT PLACEMENT Left 11/06/2019   Procedure: CYSTOSCOPY/RETROGRADE/URETEROSCOPY/HOLMIUM LASER/STENT PLACEMENT. REMOVAL NEPHROSCOPYTUBE;  Surgeon: Ceasar Mons, MD;  Location: [facility];  Service: Urology;  Laterality: Left;  . ESOPHAGOGASTRODUODENOSCOPY    . GREATER SAPHENOUS VEIN ABLATION Bilateral Before 2015   [facility] (Dr. Eugenie Filler)  . IR NEPHROSTOMY EXCHANGE LEFT  08/28/2019  . IR NEPHROSTOMY PLACEMENT LEFT  06/24/2019  . IR TRANSCATH PLC STENT  EA ADD VEIN  INC ANGIOPLASTY Left 10/2014   [facility], Dr. 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 IR TRANSCATH PLC STENT  INITIAL VEIN  INC ANGIOPLASTY Right 08/2014   [facility] - Dr. Stormy Fabian): US Guided --> R Common & External Iliac, Common Femoral Venography with Placement of 24 mmx 70 mm Stent - R Com Iliac-Ext Iliac- & Com Fem V. reducing 60-80% venous compression to ~0%).;; also noted significant L-sided venous compression (staged stenting).   [name] LIPOMA EXCISION     x 2 came back again  . NM MYOVIEW LTD  11/2005    [facility] - Dr. Cleda Mccreedy): Normal myocardial perfusion scan. No ischemia or infarction.  . TRANSTHORACIC ECHOCARDIOGRAM  04/26/2017    [facility] - Dr. Cleda Mccreedy) - report not available.  Per clinic note: Normal global LV function. Mild concentric temperature. Mild TR. Mild pulmonary hypertension.;;   . TRANSTHORACIC ECHOCARDIOGRAM  7/'15; 7/'17   [facility] - Dr. Saverio Danker) a) low normal LV function (EF 56%). Moderate concentric LVH. Mild LA dilation. Normal pulmonary pressures.  ;; b) normal global function - EF 60-65%. Moderate pulmonary hypertension. Biatrial enlargement. Moderate concentric LVH.  [name] TRANSTHORACIC ECHOCARDIOGRAM  02/2010   [facility] - Dr. Saverio Danker): Normal global LV function. Mild LVH. EF 60%.; November 2010. Moderate LV dilation. Moderate LVH. Normal EF 50-55%. Mild to moderate pulmonary hypertension. (July 2012 showed normal function with mild LVH, mild MR noted in August 2014)  . TRANSTHORACIC ECHOCARDIOGRAM  05/2019   During admission for UTI, sepsis with A. fib RVR: LV normal size and function.  EF 60-65%.  Mildly reduced RV size with severely reduced function.  Despite this normal atrial size noted with just only mild right atrial enlargement with elevated RAP (dilated IVC).  Relatively poor TR jet, but findings do suggest elevated RV pressures.    Allergies:  Allergies  Allergen Reactions  . Aldactone [Spironolactone] Other (See Comments)    unknown     Family History  Problem Relation Age of Onset  . Cancer Mother   . Heart attack Father 56  . Hypertension Brother   . Cancer Sister   . Other Neg Hx        He really does not know much about his parents and siblings health, but they did not speak about healthcare    Social History:  reports that he quit smoking about 26 years ago. His smoking use included cigarettes. He has a 35.00 pack-year smoking history. He has never used smokeless tobacco. He  reports current alcohol use. He reports that he does not use drugs.  ROS: A complete review of systems was performed.  All systems are negative except for pertinent findings as noted.  Physical Exam:  Vital signs in last 24 hours:   Constitutional:  Alert and oriented, No acute distress Cardiovascular: Regular rate and rhythm, No JVD Respiratory: Normal respiratory effort, Lungs clear bilaterally GI: Abdomen is soft, nontender, nondistended, no abdominal masses GU: No CVA tenderness Lymphatic: No lymphadenopathy Neurologic: Grossly intact, no focal deficits Psychiatric: Normal mood and affect  Laboratory Data:  No results for input(s): WBC, HGB, HCT, PLT in the last 72 hours.  No results for input(s): NA, K, CL, GLUCOSE, BUN, CALCIUM, CREATININE in the last 72 hours.  Invalid input(s): CO3   No results found for this or any previous visit (from the past 24 hour(s)). Recent Results (from the past 240 hour(s))  SARS CORONAVIRUS 2 (TAT 6-24 HRS) Nasopharyngeal Nasopharyngeal Swab     Status:  None   Collection Time: 10/20/20  9:24 AM   Specimen: Nasopharyngeal Swab  Result Value Ref Range Status   SARS Coronavirus 2 NEGATIVE NEGATIVE Final    Comment: (NOTE) SARS-CoV-2 target nucleic acids are NOT DETECTED.  The SARS-CoV-2 RNA is generally detectable in upper and lower respiratory specimens during the acute phase of infection. Negative results do not preclude SARS-CoV-2 infection, do not rule out co-infections with other pathogens, and should not be used as the sole basis for treatment or other patient management decisions. Negative results must be combined with clinical observations, patient history, and epidemiological information. The expected result is Negative.  Fact Sheet for Patients: SugarRoll.be  Fact Sheet for Healthcare Providers: https://www.woods-mathews.com/  This test is not yet approved or cleared by the [place] FDA and  has been authorized for detection and/or diagnosis of SARS-CoV-2 by FDA under an Emergency Use Authorization (EUA). This EUA will remain  in effect (meaning this test can be used) for the duration of the COVID-19 declaration under Se ction 564(b)(1) of the Act, 21 U.S.C. section 360bbb-3(b)(1), unless the authorization is terminated or revoked sooner.  Performed at [facility], [place] [address], [place], [place] [address]     Renal Function: No results for input(s): CREATININE in the last 168 hours. CrCl cannot be calculated (Patient's most recent lab result is older than the maximum 21 days allowed.).  Radiologic Imaging: No results found.  I independently reviewed the above imaging studies.  Assessment and Plan Merton Wadlow is a 81 y.o. male with a bladder mass with concomitant left sided hydronephrosis, likely due to the bladder lesion obstructing the left ureteral orifice.    The risks, benefits and alternatives of cystoscopy with TURBT with left ureteroscopy was discussed with the patient.  The risks include, but are not limited to, bleeding,  urinary tract infection, bladder perforation requiring prolonged catheterization and/or open bladder repair, ureteral obstruction, stent related pain, voiding dysfunction and the inherent risks of general anesthesia.  The patient voices understanding and wishes to proceed.   Ellison Hughs, MD 10/21/2020, 10:13 AM  [facility] Pager: [phone]

## 2020-10-21 NOTE — Op Note (Signed)
Operative Note  Preoperative diagnosis:  1.  2 cm bladder mass  2.  Left hydronephrosis  Postoperative diagnosis: 1.  2 cm bladder mass obstructing the left ureteral orifice 2.  Left hydronephrosis  Procedure(s): 1.  Cystoscopy with TURBT of 2 cm bladder mass (medium) 2.  Left ureteral stent placement 3.  Left retrograde pyelogram with intraoperative interpretation of fluoroscopic imaging  Surgeon: Ellison Hughs, MD  Assistants:  None  Anesthesia:  General  Complications:  None  EBL: 20 mL  Specimens: 1.  Left ureteral orifice mass  Drains/Catheters: 1.  6 French, 26 cm JJ stent without tether 2.  20 French two-way Foley catheter with 10 mL of sterile water in the balloon  Intraoperative findings:   1. 2 cm sessile appearing bladder mass completely involving the left ureteral orifice.  Following resection, the mass had the appearance of prostatic tissue 2. Left retrograde pyelogram revealed severe dilation of the left ureter and left renal pelvis with no additional filling defects  Indication:  Patrick Bishop is a 80 y.o. male who was found to have a 2 cm bladder mass with concomitant left-sided hydronephrosis that was incidentally identified on CT from November 2021 following a fall from standing.  He has been consented for the above procedures, voices understanding and wishes to proceed.  Description of procedure:  After informed consent was obtained, the patient was brought to the operating room and general LMA anesthesia was administered. The patient was then placed in the dorsolithotomy position and prepped and draped in the usual sterile fashion. A timeout was performed. A 23 French rigid cystoscope was then inserted into the urethral meatus and advanced into the bladder under direct vision. A complete bladder survey revealed the findings listed above.  The rigid cystoscope was then exchanged for a 26 French resectoscope with a bipolar loop working element.  The  bladder lesion was completely involving the left ureteral orifice.  The right ureteral orifice was identified and well away from the lesion.  The bipolar loop was then used to carefully resect the overlying tissue, which had the appearance of prostatic tissue, until the left ureteral orifice was identified.  A 5 French ureteral catheter was then inserted into the left ureteral orifice and a retrograde pyelogram was obtained, with the findings listed above.  A Glidewire was then used to intubate the lumen of the ureteral catheter and was advanced up to the left renal pelvis, under fluoroscopic guidance.  The catheter was then removed, leaving the wire in place.  A 6 French, 26 cm JJ stent was then advanced over the wire and into good position within the left collecting system, confirming placement via fluoroscopy.  I then proceeded to resect the majority of the remaining tissue surrounding the left ureteral orifice, which again, had the appearance of prostate tissue.  The area of resection was then extensively fulgurated until hemostasis was achieved.  A 20 French two-way Foley catheter was inserted with return of clear irrigant.  The catheter was extensively irrigated and found to be free of clots.  The Foley catheter was then placed to gravity drainage.  The patient was awoken from anesthesia having tolerated the procedure well.  He was transferred to the postanesthesia in stable condition.  Plan: Follow-up on 10/29/2020 for a voiding trial and to discuss his pathology results.

## 2020-10-21 NOTE — Anesthesia Postprocedure Evaluation (Signed)
Anesthesia Post Note  Patient: Patrick Bishop  Procedure(s) Performed: TRANSURETHRAL RESECTION OF BLADDER TUMOR (TURBT)/ RETROGRADE/  URETERAL STENT PLACEMENT (Left )     Patient location during evaluation: PACU Anesthesia Type: General Level of consciousness: awake and alert Pain management: pain level controlled Vital Signs Assessment: post-procedure vital signs reviewed and stable Respiratory status: spontaneous breathing, nonlabored ventilation and respiratory function stable Cardiovascular status: blood pressure returned to baseline and stable Postop Assessment: no apparent nausea or vomiting Anesthetic complications: no   No complications documented.  Last Vitals:  Vitals:   10/21/20 1503 10/21/20 1530  BP: 121/73 128/65  Pulse: (!) 58 (!) 49  Resp: 15 18  Temp: 36.4 C   SpO2: 95% 92%    Last Pain:  Vitals:   10/21/20 1530  TempSrc:   PainSc: 0-No pain                 Audry Pili

## 2020-10-21 NOTE — Anesthesia Procedure Notes (Signed)
Procedure Name: LMA Insertion Date/Time: 10/21/2020 1:36 PM Performed by: Garrel Ridgel, CRNA Pre-anesthesia Checklist: Patient identified, Emergency Drugs available, Suction available and Patient being monitored Patient Re-evaluated:Patient Re-evaluated prior to induction Oxygen Delivery Method: Circle system utilized Preoxygenation: Pre-oxygenation with 100% oxygen Induction Type: IV induction Ventilation: Mask ventilation without difficulty LMA Size: 4.0 Number of attempts: 1 Placement Confirmation: positive ETCO2 Tube secured with: Tape Dental Injury: Teeth and Oropharynx as per pre-operative assessment

## 2020-10-22 ENCOUNTER — Encounter (HOSPITAL_COMMUNITY): Payer: Self-pay | Admitting: Urology

## 2020-10-23 LAB — SURGICAL PATHOLOGY

## 2020-10-29 DIAGNOSIS — N130 Hydronephrosis with ureteropelvic junction obstruction: Secondary | ICD-10-CM | POA: Diagnosis not present

## 2020-10-29 DIAGNOSIS — C670 Malignant neoplasm of trigone of bladder: Secondary | ICD-10-CM | POA: Diagnosis not present

## 2020-10-29 DIAGNOSIS — R3914 Feeling of incomplete bladder emptying: Secondary | ICD-10-CM | POA: Diagnosis not present

## 2020-11-02 ENCOUNTER — Telehealth: Payer: Self-pay | Admitting: Oncology

## 2020-11-02 NOTE — Telephone Encounter (Signed)
Received a new pt referral from Dr. Dema Severin at [facility] for bladder cancer. Pt has been cld and scheduled to see Dr. Alen Blew on 2/8 at 11am. Pt aware to arrive 20 minutes early.

## 2020-11-02 NOTE — Telephone Encounter (Signed)
Received a call from the pt's daughter to reschedule his appt w/Dr. Alen Blew due a scheduling conflict for her. She has been rescheduled to see Dr. Alen Blew on 2/10 at 11am.

## 2020-11-03 ENCOUNTER — Ambulatory Visit: Payer: Medicare Other | Admitting: Oncology

## 2020-11-04 DIAGNOSIS — C670 Malignant neoplasm of trigone of bladder: Secondary | ICD-10-CM | POA: Diagnosis not present

## 2020-11-04 DIAGNOSIS — N130 Hydronephrosis with ureteropelvic junction obstruction: Secondary | ICD-10-CM | POA: Diagnosis not present

## 2020-11-05 ENCOUNTER — Inpatient Hospital Stay: Payer: Medicare Other | Attending: Oncology | Admitting: Oncology

## 2020-11-05 ENCOUNTER — Other Ambulatory Visit: Payer: Self-pay

## 2020-11-05 VITALS — BP 113/67 | HR 80 | Temp 98.0°F | Resp 20 | Ht 73.0 in | Wt 315.3 lb

## 2020-11-05 DIAGNOSIS — C679 Malignant neoplasm of bladder, unspecified: Secondary | ICD-10-CM | POA: Diagnosis not present

## 2020-11-05 DIAGNOSIS — I509 Heart failure, unspecified: Secondary | ICD-10-CM | POA: Diagnosis not present

## 2020-11-05 DIAGNOSIS — E669 Obesity, unspecified: Secondary | ICD-10-CM | POA: Diagnosis not present

## 2020-11-05 DIAGNOSIS — Z87891 Personal history of nicotine dependence: Secondary | ICD-10-CM

## 2020-11-05 DIAGNOSIS — N1330 Unspecified hydronephrosis: Secondary | ICD-10-CM | POA: Diagnosis not present

## 2020-11-05 DIAGNOSIS — Z809 Family history of malignant neoplasm, unspecified: Secondary | ICD-10-CM

## 2020-11-05 DIAGNOSIS — I2510 Atherosclerotic heart disease of native coronary artery without angina pectoris: Secondary | ICD-10-CM

## 2020-11-05 DIAGNOSIS — C670 Malignant neoplasm of trigone of bladder: Secondary | ICD-10-CM

## 2020-11-05 HISTORY — DX: Malignant neoplasm of trigone of bladder: C67.0

## 2020-11-05 NOTE — Progress Notes (Signed)
Reason for the request:    Bladder cancer  HPI: I was asked by Dr. Lovena Neighbours to evaluate [name] for evaluation of bladder cancer.  He is an 80 year old man with history of obesity, coronary disease and congestive heart failure as well as recurrent nephrolithiasis.  He was found to have a bladder mass associated with left-sided hydronephrosis on a CT scan obtained in November 2021.  At that time he has sustained a fall and found to have a worsening renal failure with a creatinine of 1.99.  CT scan of the abdomen and pelvis showed a moderate left hydronephrosis with a left posterior bladder wall thickening concerning for bladder neoplasm.  He was evaluated by Dr. Lovena Neighbours and underwent cystoscopy and TURBT as well as a left stent placement completed on October 21, 2020.  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  His creatinine clearance on January 26 was 35 cc/min and creatinine repeated on February 3 and showed a creatinine of 2.1 and creatinine clearance of less than 30 cc/min at 28.9.  Clinically, he reports no major complaints at this time.  His mobility is limited chronically and uses a walker.  He has not reported any hematuria, dysuria or further decline in his performance status.  He does not report any headaches, blurry vision, syncope or seizures. Does not report any fevers, chills or sweats.  Does not report any cough, wheezing or hemoptysis.  Does not report any chest pain, palpitation, orthopnea or leg edema.  Does not report any nausea, vomiting or abdominal pain.  Does not report any constipation or diarrhea.  Does not report any skeletal complaints.    Does not report frequency, urgency or hematuria.  Does not report any skin rashes or lesions. Does not report any heat or cold intolerance.  Does not report any lymphadenopathy or petechiae.  Does not report any anxiety or depression.  Remaining review of systems is negative.     Past Medical History:  Diagnosis Date  . Arthritis   . CHF (congestive heart failure) ([place])   . Chronic diastolic heart failure (HCC)    Normal LV Fxn by Echo 04/2017   . Chronic venous insufficiency 2015   s/p Bilateral Iliac & Femoral Stents (including IVC) - DEEP Venous Reflux.  Also s/p Bilateral GSV Ablation.  [name] Dyspnea   . Dysrhythmia    a fib   . Essential hypertension   . History of kidney stones   . Hx of adenomatous colonic polyps   . Hyperlipidemia with target LDL less than 100   . Longstanding persistent atrial fibrillation ([place])   . Nephrolithiasis 05/2019   CKD stage 3  . Obesity   . OSA on CPAP    May need New CPAP machine & CPAP MD)  . Peripheral vascular disease (HCC)    venous insufficiency in legs  . Pneumonia 08/2019  . Pre-diabetes   . Pulmonary hypertension ([place])   . Pyelonephritis 05/2019  . Venous insufficiency    LE  :  Past Surgical History:  Procedure Laterality Date  . ABDOMINAL VENOUS DUPLEX Bilateral 07/2014   ([place]iology: Dr. Cleda Mccreedy): (prior to Bilateral Iliac-Femoral Venous Stent Placement): Patent IVC.  No DVT B/L Iliac or Com fem V..  +++ DEEP V REFLUX B/L ILIAC & FEM V.  Absent Superficial V Reflux B/L x L SSV (too tortuous for intervention).  BL GSV occluded - prior ablation.   . ABDOMINAL VENOUS DUPLEX Bilateral 11/2014  Absent DVT.  Left common femoral venous reflux: 1508 MS left common femoral vein, 2284MS left proximal femoral vein -> cystic structure noted in the left popliteal vein (7.6 x 1.4 x 3.8 cm).;  Right leg deep venous reflux noted in the distal iliac, common femoral, proximal profunda femoral, proximal superficial femoral, superficial femoral and popliteal vein.  [name] CARDIAC CATHETERIZATION  10/2009   [facility] - Dr. 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  EF~45%.  Mild pulmonary pretension.  [name] CARDIAC PET  08/2009   Inferolateral partially  reversible defect -- > false positive by cath  . COLONOSCOPY    . CYSTOSCOPY/URETEROSCOPY/HOLMIUM LASER/STENT PLACEMENT Left 11/06/2019   Procedure: CYSTOSCOPY/RETROGRADE/URETEROSCOPY/HOLMIUM LASER/STENT PLACEMENT. REMOVAL NEPHROSCOPYTUBE;  Surgeon: Ceasar Mons, MD;  Location: [facility];  Service: Urology;  Laterality: Left;  . ESOPHAGOGASTRODUODENOSCOPY    . GREATER SAPHENOUS VEIN ABLATION Bilateral Before 2015   [facility] (Dr. Eugenie Filler)  . IR NEPHROSTOMY EXCHANGE LEFT  08/28/2019  . IR NEPHROSTOMY PLACEMENT LEFT  06/24/2019  . IR TRANSCATH PLC STENT  EA ADD VEIN  INC ANGIOPLASTY Left 10/2014   [facility], Dr. 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 IR TRANSCATH PLC STENT  INITIAL VEIN  INC ANGIOPLASTY Right 08/2014   [facility] - Dr. Stormy Fabian): US Guided --> R Common & External Iliac, Common Femoral Venography with Placement of 24 mmx 70 mm Stent - R Com Iliac-Ext Iliac- & Com Fem V. reducing 60-80% venous compression to ~0%).;; also noted significant L-sided venous compression (staged stenting).   [name] LIPOMA EXCISION     x 2 came back again  . NM MYOVIEW LTD  11/2005   Aden[facility] My[facility]iology - Dr. Cleda Mccreedy): Normal myocardial perfusion scan. No ischemia or infarction.  . TRANSTHORACIC ECHOCARDIOGRAM  04/26/2017    [facility]iology - Dr. Cleda Mccreedy) - report not available.  Per clinic note: Normal global LV function. Mild concentric temperature. Mild TR. Mild pulmonary hypertension.;;   . TRANSTHORACIC ECHOCARDIOGRAM  7/'15; 7/'17   [facility] Cardiology - Dr. Saverio Danker) a) low normal LV function (EF 56%). Moderate concentric LVH. Mild LA dilation. Normal pulmonary pressures.  ;; b) normal global function - EF 60-65%. Moderate pulmonary hypertension.  Biatrial enlargement. Moderate concentric LVH.  [name] TRANSTHORACIC ECHOCARDIOGRAM  02/2010   [facility] Cardiology - Dr. Saverio Danker): Normal global LV function. Mild LVH. EF 60%.; November 2010. Moderate LV dilation. Moderate LVH. Normal EF 50-55%. Mild to moderate pulmonary hypertension. (July 2012 showed normal function with mild LVH, mild MR noted in August 2014)  . TRANSTHORACIC ECHOCARDIOGRAM  05/2019   During admission for UTI, sepsis with A. fib RVR: LV normal size and function.  EF 60-65%.  Mildly reduced RV size with severely reduced function.  Despite this normal atrial size noted with just only mild right atrial enlargement with elevated RAP (dilated IVC).  Relatively poor TR jet, but findings do suggest elevated RV pressures.  . TRANSURETHRAL RESECTION OF BLADDER TUMOR Left 10/21/2020   Procedure: TRANSURETHRAL RESECTION OF BLADDER TUMOR (TURBT)/ RETROGRADE/  URETERAL STENT PLACEMENT;  Surgeon: Ceasar Mons, MD;  Location: [facility];  Service: Urology;  Laterality: Left;  :   Current Outpatient Medications:  .  atorvastatin (LIPITOR)  10 MG tablet, Take 1 tablet (10 mg total) by mouth daily., Disp: 90 tablet, Rfl: 3 .  HYDROcodone-acetaminophen (NORCO/VICODIN) 5-325 MG tablet, Take 1 tablet by mouth 2 (two) times daily as needed for moderate pain. (Patient taking differently: Take 1 tablet by mouth every 6 (six) hours as needed for moderate pain.), Disp: 5 tablet, Rfl: 0 .  magnesium oxide (MAG-OX) 400 (241.3 Mg) MG tablet, Take 1 tablet (400 mg total) by mouth daily., Disp: , Rfl:  .  metoprolol tartrate (LOPRESSOR) 25 MG tablet, TAKE 1 TABLET IN THE MORNING AND ONE AND ONE-HALF TABLETS (37.5 MG) IN THE EVENING, Disp: 225 tablet, Rfl: 3 .  oxybutynin (DITROPAN) 5 MG tablet, Take 1 tablet (5 mg total) by mouth every 8 (eight) hours as needed for bladder spasms., Disp: 30 tablet, Rfl: 1 .  phenazopyridine (PYRIDIUM) 200 MG tablet, Take 1 tablet (200 mg total) by mouth 3 (three) times  daily as needed (for pain with urination)., Disp: 30 tablet, Rfl: 0 .  potassium chloride SA (KLOR-CON) 20 MEQ tablet, Take 1 tablet (20 mEq total) by mouth 2 (two) times daily., Disp: 180 tablet, Rfl: 3 .  torsemide (DEMADEX) 20 MG tablet, Take 40-60 mg by mouth See admin instructions. 60 mg in the morning, 40 mg in the afternoon, Disp: , Rfl:  .  XARELTO 20 MG TABS tablet, TAKE 1 TABLET DAILY WITH SUPPER (Patient taking differently: Take 20 mg by mouth daily with supper. TAKE 1 TABLET DAILY WITH SUPPER), Disp: 90 tablet, Rfl: 4:  Allergies  Allergen Reactions  . Aldactone [Spironolactone] Other (See Comments)    unknown   :  Family History  Problem Relation Age of Onset  . Cancer Mother   . Heart attack Father 30  . Hypertension Brother   . Cancer Sister   . Other Neg Hx        He really does not know much about his parents and siblings health, but they did not speak about healthcare  :  Social History   Socioeconomic History  . Marital status: Widowed    Spouse name: Not on file  . Number of children: 2  . Years of education: Not on file  . Highest education level: Some college, no degree  Occupational History  . 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 Occupation: Lobbyist: [employer]    Comment: followed by several odd jobs  . Occupation: Retired    Comment: After several odd jobs  Tobacco Use  . Smoking status: Former Smoker    Packs/day: 1.00    Years: 35.00    Pack years: 35.00    Types: Cigarettes    Quit date: 1996    Years since quitting: 26.1  . Smokeless tobacco: Never Used  . Tobacco comment: smoked from age [age]   Vaping Use  . Vaping Use: Never used  Substance and Sexual Activity  . Alcohol use: Yes    Comment: occasional  . Drug use: No  . Sexual activity: Not Currently  Other Topics Concern  . Not on file  Social History Narrative   Javaun recently moved to [place] in January 2019.   This was to be close to his daughter who is here with him today.  He has 2 daughters and one grandchild.      Jacqualine Code,  [place] --PCP was Sherlynn Carbon, MD.  Cardiologist: Saverio Danker, MD (who followed him every 3  months with routine visits.  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   He currently lives at [place] [facility] (Dr. Albesa Seen) --however he hopes to be moving into his daughter's house once they have not established and ready for him.   Social Determinants of Health   Financial Resource Strain: Not on file  Food Insecurity: Not on file  Transportation Needs: Not on file  Physical Activity: Not on file  Stress: Not on file  Social Connections: Not on file  Intimate Partner Violence: Not on file  :  Pertinent items are noted in HPI.  Exam: Blood pressure 113/67, pulse 80, temperature 98 F (36.7 C), temperature source Tympanic, resp. rate 20, height 6\' 1"  (1.854 m), weight (!) 315 lb 4.8 oz (143 kg), SpO2 98 %.   ECOG 2 General appearance: alert and cooperative appeared without distress. Head: atraumatic without any abnormalities. Eyes: conjunctivae/corneas clear. PERRL.  Sclera anicteric. Throat: lips, mucosa, and tongue normal; without oral thrush or ulcers. Resp: clear to auscultation bilaterally without rhonchi, wheezes or dullness to percussion. Cardio: regular rate and rhythm, S1, S2 normal, no murmur, click, rub or gallop GI: soft, non-tender; bowel sounds normal; no masses,  no organomegaly Skin: Skin color, texture, turgor normal. No rashes or lesions Lymph nodes: Cervical, supraclavicular, and axillary nodes normal. Neurologic: Grossly normal without any motor, sensory or deep tendon reflexes. Musculoskeletal: No joint deformity or effusion.    Assessment and Plan:    80 year old man with:  1.  High-grade urothelial carcinoma of the bladder  diagnosed in January 2022.  He was found to have at least T2N0 disease with muscle invasion as well as left-sided hydronephrosis.  He is status post TURBT that confirmed the diagnosis without any evidence of advanced disease based on CT scan in November 2021.  The natural course of this disease and treatment options were discussed at this time.  Gold standard treatment in the form of neoadjuvant chemotherapy utilizing platinum based regimen followed by a radical cystectomy is unlikely an option for him.  Given his multiple comorbidities as well as a baseline for kidney function makes him a poor candidate for cisplatin chemotherapy and less likely would be a surgical candidate.  Alternative treatment options would be combination of the radiation and weekly carboplatin chemotherapy versus radiation alone versus more palliative approach with recurrent cystoscopy and fulguration if his disease recurs.  Risks and benefits of all these approaches were discussed.  Complication associated with weekly carboplatin with radiation if he opts to proceed with that were discussed.  He is on include nausea, fatigue, mild suppression, neutropenia and renal dysfunction.  After discussion, he is amendable to consider radiation therapy as an alternative to radical cystectomy.  We will make the appropriate referral and once his radiation schedule is established we will arrange for his concomitant weekly chemotherapy.  Alternative options such as supportive care only and periodic cystoscopy and fulguration is considered given his overall age multiple comorbid conditions.  We have also Discussed goals of therapy and prognosis overall.  He understands he might not have a curative option but certainly his disease can be treated effectively with a prolonged disease-free interval could be expected.  2.  IV access: Peripheral veins will be in use if needed.  3.  Antiemetics: Prescription for Compazine will be available to  him.  4.  Follow-up: To be determined pending his evaluation by radiation oncology in  the future pending the start of therapy.  60  minutes were dedicated to this visit. The time was spent on reviewing laboratory data, imaging studies, discussing treatment options, reviewing pathology results and answering questions regarding future plan.      A copy of this consult has been forwarded to the requesting physician.

## 2020-11-09 ENCOUNTER — Other Ambulatory Visit: Payer: Self-pay | Admitting: Cardiology

## 2020-11-15 ENCOUNTER — Other Ambulatory Visit: Payer: Self-pay | Admitting: Cardiology

## 2020-11-16 NOTE — Telephone Encounter (Signed)
44m, 143kg,  Scr 1.91 10/21/20, Lovw/kilroy 09/28/20 ccr 62

## 2020-11-19 DIAGNOSIS — M1710 Unilateral primary osteoarthritis, unspecified knee: Secondary | ICD-10-CM | POA: Diagnosis not present

## 2020-11-19 DIAGNOSIS — G894 Chronic pain syndrome: Secondary | ICD-10-CM | POA: Diagnosis not present

## 2020-11-19 DIAGNOSIS — I739 Peripheral vascular disease, unspecified: Secondary | ICD-10-CM | POA: Diagnosis not present

## 2020-11-19 DIAGNOSIS — M169 Osteoarthritis of hip, unspecified: Secondary | ICD-10-CM | POA: Diagnosis not present

## 2020-11-23 ENCOUNTER — Telehealth: Payer: Self-pay | Admitting: Cardiology

## 2020-11-23 NOTE — Telephone Encounter (Signed)
Spoke with pt who report he's been experiencing bright red blood in urine since yesterday. He report he contacted his urologist and was instructed to hold xarelto for a couple days. Pt calling to see if MD agree with recommendations.  Will route to MD and Pharm D

## 2020-11-23 NOTE — Telephone Encounter (Signed)
Pt updated and verbalized understanding.  

## 2020-11-23 NOTE — Telephone Encounter (Signed)
I Agree with holding Xarelto  Until bleeding stops.  Glenetta Hew, MD

## 2020-11-23 NOTE — Telephone Encounter (Signed)
Agree with recommendation.  Appropriate to HOLD anticoagulation for 1-2 dasy due to current bleeding.  Please resume as soon as possible per urologist instructions.

## 2020-11-23 NOTE — Telephone Encounter (Signed)
Pt c/o medication issue:  1. Name of Medication: XARELTO 20 MG TABS tablet  2. How are you currently taking this medication (dosage and times per day)? As prescribed  3. Are you having a reaction (difficulty breathing--STAT)? No   4. What is your medication issue?  Patient states he has bee experiencing blood in his urine. He contacted his urologist, Dr. Lovena Neighbours, and he was advised to hold his blood thinner for a few days. Patient's is requesting to speak with Dr. Allison Quarry nurse prior to holding this medication. Please return call to discuss.

## 2020-11-26 DIAGNOSIS — R319 Hematuria, unspecified: Secondary | ICD-10-CM | POA: Diagnosis not present

## 2020-11-26 DIAGNOSIS — R609 Edema, unspecified: Secondary | ICD-10-CM | POA: Diagnosis not present

## 2020-11-26 DIAGNOSIS — I129 Hypertensive chronic kidney disease with stage 1 through stage 4 chronic kidney disease, or unspecified chronic kidney disease: Secondary | ICD-10-CM | POA: Diagnosis not present

## 2020-11-26 DIAGNOSIS — C689 Malignant neoplasm of urinary organ, unspecified: Secondary | ICD-10-CM | POA: Diagnosis not present

## 2020-11-26 DIAGNOSIS — N189 Chronic kidney disease, unspecified: Secondary | ICD-10-CM | POA: Diagnosis not present

## 2020-11-26 DIAGNOSIS — N179 Acute kidney failure, unspecified: Secondary | ICD-10-CM | POA: Diagnosis not present

## 2020-11-26 DIAGNOSIS — N1330 Unspecified hydronephrosis: Secondary | ICD-10-CM | POA: Diagnosis not present

## 2020-12-01 ENCOUNTER — Telehealth: Payer: Self-pay | Admitting: Oncology

## 2020-12-01 NOTE — Telephone Encounter (Signed)
Release: [id number]  Faxed ov note to [facility] fax# [phone]

## 2020-12-03 NOTE — Progress Notes (Signed)
GU Location of Tumor / Histology: High-grade urothelial carcinoma of the bladder diagnosed in January 2022.  [name] was found to have a bladder mass associated with left-sided hydronephrosis on a CT scan obtained in November 2021.  At that time he has sustained a fall and found to have a worsening renal failure with a creatinine of 1.99.  CT scan of the abdomen and pelvis showed a moderate left hydronephrosis with a left posterior bladder wall thickening concerning for bladder neoplasm.   SURGICAL PATHOLOGY  CASE: [id number]  PATIENT: Patrick Bishop  Surgical Pathology Report  Clinical History: Possible bladder mass, left hydronephrosis (crm)  FINAL MICROSCOPIC DIAGNOSIS:  A. URETERAL ORIFICE MASS, LEFT, TURBT:  Infiltrating high grade urothelial carcinoma  The carcinoma invades muscularis propria (detrusor muscle)   Past/Anticipated interventions by urology, if any: TURP, referral to Dr. Alen Blew  Past/Anticipated interventions by medical oncology, if any: referral to Dr. Tammi Klippel for consideration of radiation therapy in lieu of cystectomy  Weight changes, if any: denies  Bowel/Bladder complaints, if any: Denies dysuria or hematuria. Denies urinary leakage or incontinence. Denies any diarrhea, nausea or vomiting.   Nausea/Vomiting, if any: denies  Pain issues, if any:  Reports arthritic pain all over but worse left hip and right knee  SAFETY ISSUES:  Prior radiation? denies  Pacemaker/ICD? denies  Possible current pregnancy? no, male patient  Is the patient on methotrexate? no  Current Complaints / other details:  80 year old male. Widowed. 2 daughters and 1 grandchild. Moved to [place] to be close to his daughter.

## 2020-12-04 ENCOUNTER — Ambulatory Visit
Admission: RE | Admit: 2020-12-04 | Discharge: 2020-12-04 | Disposition: A | Discharge status: Home / Self Care | Payer: Medicare Other | Source: Ambulatory Visit | Attending: Pediatrics | Admitting: Pediatrics

## 2020-12-04 ENCOUNTER — Other Ambulatory Visit: Payer: Self-pay

## 2020-12-04 ENCOUNTER — Ambulatory Visit
Admission: RE | Admit: 2020-12-04 | Discharge: 2020-12-04 | Disposition: A | Discharge status: Home / Self Care | Payer: Medicare Other | Source: Ambulatory Visit | Attending: Radiation Oncology | Admitting: Radiation Oncology

## 2020-12-04 ENCOUNTER — Encounter: Payer: Self-pay | Admitting: Radiation Oncology

## 2020-12-04 DIAGNOSIS — M129 Arthropathy, unspecified: Secondary | ICD-10-CM | POA: Insufficient documentation

## 2020-12-04 DIAGNOSIS — Z86010 Personal history of colonic polyps: Secondary | ICD-10-CM | POA: Diagnosis not present

## 2020-12-04 DIAGNOSIS — I509 Heart failure, unspecified: Secondary | ICD-10-CM | POA: Diagnosis not present

## 2020-12-04 DIAGNOSIS — G4730 Sleep apnea, unspecified: Secondary | ICD-10-CM | POA: Insufficient documentation

## 2020-12-04 DIAGNOSIS — I739 Peripheral vascular disease, unspecified: Secondary | ICD-10-CM | POA: Diagnosis not present

## 2020-12-04 DIAGNOSIS — I129 Hypertensive chronic kidney disease with stage 1 through stage 4 chronic kidney disease, or unspecified chronic kidney disease: Secondary | ICD-10-CM | POA: Insufficient documentation

## 2020-12-04 DIAGNOSIS — Z800 Family history of malignant neoplasm of digestive organs: Secondary | ICD-10-CM | POA: Insufficient documentation

## 2020-12-04 DIAGNOSIS — Z809 Family history of malignant neoplasm, unspecified: Secondary | ICD-10-CM | POA: Insufficient documentation

## 2020-12-04 DIAGNOSIS — N1330 Unspecified hydronephrosis: Secondary | ICD-10-CM | POA: Diagnosis not present

## 2020-12-04 DIAGNOSIS — N183 Chronic kidney disease, stage 3 unspecified: Secondary | ICD-10-CM | POA: Diagnosis not present

## 2020-12-04 DIAGNOSIS — Z7901 Long term (current) use of anticoagulants: Secondary | ICD-10-CM | POA: Diagnosis not present

## 2020-12-04 DIAGNOSIS — E785 Hyperlipidemia, unspecified: Secondary | ICD-10-CM | POA: Insufficient documentation

## 2020-12-04 DIAGNOSIS — Z79899 Other long term (current) drug therapy: Secondary | ICD-10-CM | POA: Diagnosis not present

## 2020-12-04 DIAGNOSIS — I272 Pulmonary hypertension, unspecified: Secondary | ICD-10-CM | POA: Insufficient documentation

## 2020-12-04 DIAGNOSIS — C678 Malignant neoplasm of overlapping sites of bladder: Secondary | ICD-10-CM | POA: Insufficient documentation

## 2020-12-04 DIAGNOSIS — Z87891 Personal history of nicotine dependence: Secondary | ICD-10-CM | POA: Insufficient documentation

## 2020-12-04 DIAGNOSIS — I872 Venous insufficiency (chronic) (peripheral): Secondary | ICD-10-CM | POA: Insufficient documentation

## 2020-12-04 DIAGNOSIS — I4891 Unspecified atrial fibrillation: Secondary | ICD-10-CM | POA: Diagnosis not present

## 2020-12-04 NOTE — Progress Notes (Signed)
Radiation Oncology         [phone] ________________________________  Initial outpatient Consultation  Name: Patrick Bishop MRN: [id number]  Date: 12/04/2020  DOB: 1940-11-20  LT:[id number], [name], MD  [name], MD   REFERRING PHYSICIAN: [name], MD  DIAGNOSIS: 80 y.o. 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    ICD-10-CM   1. Malignant neoplasm of overlapping sites of bladder ([place])  C67.8     HISTORY OF PRESENT ILLNESS: Patrick Bishop is a 80 y.o. male with a diagnosis of bladder cancer. He has been followed by Dr. Vernie Shanks for chronic kidney disease and by Dr. Lovena Neighbours for kidney stones. On 08/12/20, he was taken to the ED following a fall. He, and his daughter, had been on their way to an appointment with Dr. Vernie Shanks. His daughter requested renal workup while in the ED, and the patient was found to be in acute renal failure. He underwent renal stone study CT that day showing a 2.7 cm nodular thickening of the left posterior bladder wall, concerning for bladder neoplasm with associated moderate to severe left-sided hydroureteronephrosis secondary to obstruction of the left ureteral orifice by the bladder mass. He subsequently underwent TURBT with resection of a 2 cm bladder mass that was obstructing the left ureteral orifice and left ureteral stent placement on 10/21/2020 under the care of Dr. Lovena Neighbours.  Final surgical pathology  revealed infiltrating, high grade urothelial carcinoma invading the muscularis propria. He was referred to Dr. Alen Blew on 11/05/20 to discuss treatment options. Given his multiple comorbidities, advanced age as well as his baseline kidney function, Dr. Hazeline Junker recommendation was to proceed with weekly carboplatin concurrent with daily radiotherapy.  The patient reviewed the biopsy results with his urologist and he has kindly been referred today for discussion of potential radiation treatment options.   PREVIOUS  RADIATION THERAPY: No  PAST MEDICAL HISTORY:  Past Medical History:  Diagnosis Date  . Arthritis   . CHF (congestive heart failure) ([employer])   . Chronic diastolic heart failure (HCC)    Normal LV Fxn by Echo 04/2017   . Chronic venous insufficiency 2015   s/p Bilateral Iliac & Femoral Stents (including IVC) - DEEP Venous Reflux.  Also s/p Bilateral GSV Ablation.  [name] Dyspnea   . Dysrhythmia    a fib   . Essential hypertension   . History of kidney stones   . Hx of adenomatous colonic polyps   . Hyperlipidemia with target LDL less than 100   . Longstanding persistent atrial fibrillation ([place])   . Nephrolithiasis 05/2019   CKD stage 3  . Obesity   . OSA on CPAP    May need New CPAP machine & CPAP MD)  . Peripheral vascular disease (HCC)    venous insufficiency in legs  . Pneumonia 08/2019  . Pre-diabetes   . Pulmonary hypertension ([place])   . Pyelonephritis 05/2019  . Venous insufficiency    LE      PAST SURGICAL HISTORY: Past Surgical History:  Procedure Laterality Date  . ABDOMINAL VENOUS DUPLEX Bilateral 07/2014   ([place]iology: Dr. Cleda Mccreedy): (prior to Bilateral Iliac-Femoral Venous Stent Placement): Patent IVC.  No DVT B/L Iliac or Com fem V..  +++ DEEP V REFLUX B/L ILIAC & FEM V.  Absent Superficial V Reflux B/L x L SSV (too tortuous for intervention).  BL GSV occluded - prior ablation.   . ABDOMINAL VENOUS DUPLEX Bilateral 11/2014   Absent DVT.  Left common femoral venous  reflux: 1508 MS left common femoral vein, 2284MS left proximal femoral vein -> cystic structure noted in the left popliteal vein (7.6 x 1.4 x 3.8 cm).;  Right leg deep venous reflux noted in the distal iliac, common femoral, proximal profunda femoral, proximal superficial femoral, superficial femoral and popliteal vein.  [name] CARDIAC CATHETERIZATION  10/2009   [facility] - Dr. 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  EF~45%.  Mild pulmonary  pretension.  [name] CARDIAC PET  08/2009   Inferolateral partially reversible defect -- > false positive by cath  . COLONOSCOPY    . CYSTOSCOPY/URETEROSCOPY/HOLMIUM LASER/STENT PLACEMENT Left 11/06/2019   Procedure: CYSTOSCOPY/RETROGRADE/URETEROSCOPY/HOLMIUM LASER/STENT PLACEMENT. REMOVAL NEPHROSCOPYTUBE;  Surgeon: Ceasar Mons, MD;  Location: [facility];  Service: Urology;  Laterality: Left;  . ESOPHAGOGASTRODUODENOSCOPY    . GREATER SAPHENOUS VEIN ABLATION Bilateral Before 2015   [facility] (Dr. Eugenie Filler)  . IR NEPHROSTOMY EXCHANGE LEFT  08/28/2019  . IR NEPHROSTOMY PLACEMENT LEFT  06/24/2019  . IR TRANSCATH PLC STENT  EA ADD VEIN  INC ANGIOPLASTY Left 10/2014   [facility], Dr. 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 IR TRANSCATH PLC STENT  INITIAL VEIN  INC ANGIOPLASTY Right 08/2014   [facility] - Dr. Stormy Fabian): US Guided --> R Common & External Iliac, Common Femoral Venography with Placement of 24 mmx 70 mm Stent - R Com Iliac-Ext Iliac- & Com Fem V. reducing 60-80% venous compression to ~0%).;; also noted significant L-sided venous compression (staged stenting).   [name] LIPOMA EXCISION     x 2 came back again  . NM MYOVIEW LTD  11/2005   [facility] - Dr. Cleda Mccreedy): Normal myocardial perfusion scan. No ischemia or infarction.  . TRANSTHORACIC ECHOCARDIOGRAM  04/26/2017    [facility]iology - Dr. Cleda Mccreedy) - report not available.  Per clinic note: Normal global LV function. Mild concentric temperature. Mild TR. Mild pulmonary hypertension.;;   . TRANSTHORACIC ECHOCARDIOGRAM  7/'15; 7/'17   [facility] - Dr. Saverio Danker) a) low normal LV function (EF 56%). Moderate concentric LVH. Mild LA dilation. Normal pulmonary pressures.  ;; b) normal  global function - EF 60-65%. Moderate pulmonary hypertension. Biatrial enlargement. Moderate concentric LVH.  [name] TRANSTHORACIC ECHOCARDIOGRAM  02/2010   [facility]iology - Dr. Saverio Danker): Normal global LV function. Mild LVH. EF 60%.; November 2010. Moderate LV dilation. Moderate LVH. Normal EF 50-55%. Mild to moderate pulmonary hypertension. (July 2012 showed normal function with mild LVH, mild MR noted in August 2014)  . TRANSTHORACIC ECHOCARDIOGRAM  05/2019   During admission for UTI, sepsis with A. fib RVR: LV normal size and function.  EF 60-65%.  Mildly reduced RV size with severely reduced function.  Despite this normal atrial size noted with just only mild right atrial enlargement with elevated RAP (dilated IVC).  Relatively poor TR jet, but findings do suggest elevated RV pressures.  . TRANSURETHRAL RESECTION OF BLADDER TUMOR Left 10/21/2020   Procedure: TRANSURETHRAL RESECTION OF BLADDER TUMOR (TURBT)/ RETROGRADE/  URETERAL STENT PLACEMENT;  Surgeon: Ceasar Mons, MD;  Location: [facility];  Service: Urology;  Laterality: Left;    FAMILY HISTORY:  Family History  Problem Relation Age of Onset  . Colon cancer Mother   .  Heart attack Father 31  . Hypertension Brother   . Cancer Sister   . Colon cancer Sister   . Other Neg Hx        He really does not know much about his parents and siblings health, but they did not speak about healthcare    SOCIAL HISTORY:  Social History   Socioeconomic History  . Marital status: Widowed    Spouse name: Not on file  . Number of children: 2  . Years of education: Not on file  . Highest education level: Some college, no degree  Occupational History  . 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 Occupation: Lobbyist: [employer]    Comment: followed by several odd jobs  . Occupation: Retired    Comment: After several odd jobs  Tobacco Use  . Smoking status: Former Smoker     Packs/day: 1.00    Years: 35.00    Pack years: 35.00    Types: Cigarettes    Quit date: 1996    Years since quitting: 26.2  . Smokeless tobacco: Never Used  . Tobacco comment: smoked from age [age]   Vaping Use  . Vaping Use: Never used  Substance and Sexual Activity  . Alcohol use: Yes    Comment: occasional  . Drug use: No  . Sexual activity: Not Currently  Other Topics Concern  . Not on file  Social History Narrative   Sir recently moved to [place] in January 2019.  This was to be close to his daughter who is here with him today.  He has 2 daughters and one grandchild.      Jacqualine Code,  [place] --PCP was Sherlynn Carbon, MD.  Cardiologist: Saverio Danker, MD (who followed him every 3 months with routine visits.  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   He currently lives at [place] (Dr. Albesa Seen) --however he hopes to be moving into his daughter's house once they have not established and ready for him.   Social Determinants of Health   Financial Resource Strain: Not on file  Food Insecurity: Not on file  Transportation Needs: Not on file  Physical Activity: Not on file  Stress: Not on file  Social Connections: Not on file  Intimate Partner Violence: Not on file    ALLERGIES: Aldactone [spironolactone]  MEDICATIONS:  Current Outpatient Medications  Medication Sig Dispense Refill  . atorvastatin (LIPITOR) 10 MG tablet TAKE 1 TABLET DAILY 90 tablet 3  . HYDROcodone-acetaminophen (NORCO/VICODIN) 5-325 MG tablet Take 1 tablet by mouth 2 (two) times daily as needed for moderate pain. (Patient taking differently: Take 1 tablet by mouth every 6 (six) hours as needed for moderate pain.) 5 tablet 0  . magnesium oxide (MAG-OX) 400 (241.3 Mg) MG tablet Take 1 tablet (400 mg total) by mouth daily.    . potassium chloride SA (KLOR-CON) 20 MEQ tablet Take 1  tablet (20 mEq total) by mouth 2 (two) times daily. 180 tablet 3  . torsemide (DEMADEX) 20 MG tablet Take 40-60 mg by mouth See admin instructions. 60 mg in the morning, 40 mg in the afternoon    . XARELTO 20 MG TABS tablet TAKE 1 TABLET DAILY WITH SUPPER 90 tablet 3  . oxybutynin (DITROPAN) 5 MG tablet Take 1 tablet (5 mg total) by mouth every 8 (eight) hours as  needed for bladder spasms. (Patient not taking: Reported on 12/04/2020) 30 tablet 1  . phenazopyridine (PYRIDIUM) 200 MG tablet Take 1 tablet (200 mg total) by mouth 3 (three) times daily as needed (for pain with urination). (Patient not taking: Reported on 12/04/2020) 30 tablet 0   No current facility-administered medications for this encounter.    REVIEW OF SYSTEMS:  On review of systems, the patient reports that he is doing well overall. He denies any chest pain, shortness of breath, cough, fevers, chills, night sweats, unintended weight changes. He denies any bowel disturbances, and denies abdominal pain, nausea or vomiting. He denies any new musculoskeletal or joint aches or pains. A complete review of systems is obtained and is otherwise negative.    PHYSICAL EXAM:  Wt Readings from Last 3 Encounters:  12/04/20 (!) 325 lb 2 oz (147.5 kg)  11/05/20 (!) 315 lb 4.8 oz (143 kg)  10/21/20 (!) 325 lb (147.4 kg)   Temp Readings from Last 3 Encounters:  12/04/20 (!) 97.1 F (36.2 C) (Temporal)  11/05/20 98 F (36.7 C) (Tympanic)  10/21/20 97.6 F (36.4 C) (Oral)   BP Readings from Last 3 Encounters:  12/04/20 117/60  11/05/20 113/67  10/21/20 (!) 148/83   Pulse Readings from Last 3 Encounters:  12/04/20 62  11/05/20 80  10/21/20 74   Pain Assessment Pain Score: 0-No pain (denies new pain)/10  In general this is a well appearing Caucasian male in no acute distress. He's alert and oriented x4 and appropriate throughout the examination. Cardiopulmonary assessment is negative for acute distress, and he exhibits normal effort.      KPS = 80  100 - Normal; no complaints; no evidence of disease. 90   - Able to carry on normal activity; minor signs or symptoms of disease. 80   - Normal activity with effort; some signs or symptoms of disease. 28   - Cares for self; unable to carry on normal activity or to do active work. 60   - Requires occasional assistance, but is able to care for most of his personal needs. 50   - Requires considerable assistance and frequent medical care. 33   - Disabled; requires special care and assistance. 19   - Severely disabled; hospital admission is indicated although death not imminent. 56   - Very sick; hospital admission necessary; active supportive treatment necessary. 10   - Moribund; fatal processes progressing rapidly. 0     - Dead  Karnofsky DA, Abelmann Clinton, Craver LS and Burchenal [facility] [phone]) The use of the nitrogen mustards in the palliative treatment of carcinoma: with particular reference to bronchogenic carcinoma Cancer 1 634-56  LABORATORY DATA:  Lab Results  Component Value Date   WBC 6.4 10/15/2020   HGB 12.0 (L) 10/15/2020   HCT 37.0 (L) 10/15/2020   MCV 93.7 10/15/2020   PLT 208 10/15/2020   Lab Results  Component Value Date   NA 142 10/21/2020   K 4.8 10/21/2020   CL 108 10/21/2020   CO2 25 10/21/2020   Lab Results  Component Value Date   ALT 11 10/21/2020   AST 13 (L) 10/21/2020   ALKPHOS 64 10/21/2020   BILITOT 1.0 10/21/2020     RADIOGRAPHY: No results found.    IMPRESSION/PLAN: 1. 80 y.o. 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 Today, we talked to the patient and family about the findings and workup thus far. We discussed the natural history of urothelial carcinoma and general  treatment, highlighting the role of radiotherapy in the management. We discussed the available radiation techniques, and focused on the details and logistics of delivery.  The recommendation is to proceed with a 7-1/2-week course of  daily radiotherapy concurrent with chemotherapy.  We reviewed the anticipated acute and late sequelae associated with radiation in this setting. The patient was encouraged to ask questions that were answered to his satisfaction.  At the conclusion of our conversation, the patient is interested in moving forward with curative bladder sparing treatment approach with concurrent chemoradiation.  He has freely signed written consent to proceed today in the office and a copy of this document has been placed in his medical record.  He is scheduled for CT simulation at 9:30 AM on Wednesday, 12/09/2020 in anticipation of beginning his daily treatments in the near future.  We will coordinate the start of his radiation with the start of chemotherapy with Dr. Alen Blew.  We enjoyed meeting him and his daughter today and look forward to continuing to participate in his care.   Nicholos Johns, PA-C    Tyler Pita, MD  [facility] Direct Dial: [phone]  Fax: [phone] [email or web address]  Skype  [email or web address]   This document serves as a record of services personally performed by Tyler Pita, MD and Freeman Caldron, PA-C. It was created on their behalf by Wilburn Mylar, a trained medical scribe. The creation of this record is based on the scribe's personal observations and the provider's statements to them. This document has been checked and approved by the attending provider.

## 2020-12-08 ENCOUNTER — Other Ambulatory Visit: Payer: Self-pay | Admitting: Oncology

## 2020-12-08 MED ORDER — PROCHLORPERAZINE MALEATE 10 MG PO TABS: 10.0000 mg | ORAL_TABLET | Freq: Four times a day (QID) | ORAL | 0 refills | Status: DC | PRN

## 2020-12-08 NOTE — Progress Notes (Signed)
START OFF PATHWAY REGIMEN - Bladder   [id number]:Carboplatin AUC=2:   Administer weekly:     Carboplatin   **Always confirm dose/schedule in your pharmacy ordering system**  Patient Characteristics: Pre-Cystectomy or Nonsurgical Candidate (Clinical Staging), cT2-4a, cN0-1, M0, Bladder-Sparing Approach Therapeutic Status: Pre-Cystectomy or Nonsurgical Candidate (Clinical Staging) AJCC M Category: cM0 AJCC 8 Stage Grouping: II AJCC T Category: cT2 AJCC N Category: cN0 Intent of Therapy: Curative Intent, Discussed with Patient

## 2020-12-09 ENCOUNTER — Other Ambulatory Visit: Payer: Self-pay

## 2020-12-09 ENCOUNTER — Ambulatory Visit
Admission: RE | Admit: 2020-12-09 | Discharge: 2020-12-09 | Disposition: A | Discharge status: Home / Self Care | Payer: Medicare Other | Source: Ambulatory Visit | Attending: Radiation Oncology | Admitting: Radiation Oncology

## 2020-12-09 DIAGNOSIS — Z5111 Encounter for antineoplastic chemotherapy: Secondary | ICD-10-CM | POA: Diagnosis not present

## 2020-12-09 DIAGNOSIS — C670 Malignant neoplasm of trigone of bladder: Secondary | ICD-10-CM

## 2020-12-09 DIAGNOSIS — Z510 Encounter for antineoplastic radiation therapy: Secondary | ICD-10-CM | POA: Insufficient documentation

## 2020-12-09 DIAGNOSIS — C678 Malignant neoplasm of overlapping sites of bladder: Secondary | ICD-10-CM | POA: Diagnosis not present

## 2020-12-09 DIAGNOSIS — C679 Malignant neoplasm of bladder, unspecified: Secondary | ICD-10-CM | POA: Insufficient documentation

## 2020-12-09 NOTE — Progress Notes (Signed)
  Radiation Oncology         (336) [phone] ________________________________  Name: Patrick Bishop MRN: [id number]  Date: 12/09/2020  DOB: 01-03-1941  SIMULATION AND TREATMENT PLANNING NOTE    ICD-10-CM   1. Cancer of trigone of urinary bladder (HCC)  C67.0     DIAGNOSIS:  80 y.o. 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  NARRATIVE:  The patient was brought to the [place].  Identity was confirmed.  All relevant records and images related to the planned course of therapy were reviewed.  The patient freely provided informed written consent to proceed with treatment after reviewing the details related to the planned course of therapy. The consent form was witnessed and verified by the simulation staff.  Then, the patient was set-up in a stable reproducible  supine position for radiation therapy.  Contrast was instilled into the bladder with a catheter under sterile conditions.  CT images were obtained.  Surface markings were placed.  The CT images were loaded into the planning software.  Then the target and avoidance structures were contoured.  Treatment planning then occurred.  The radiation prescription was entered and confirmed.  Then, I designed and supervised the construction of a total of 5 medically necessary complex treatment devices including VacLoc body positioner and 4 MLCs to shield the bowel and femoral necks.  I have requested : 3D Simulation  I have requested a DVH of the following structures: small bowel, rectum, left femoral head, right femoral head and targets.  SPECIAL TREATMENT PROCEDURE:  The planned course of therapy using radiation constitutes a special treatment procedure. Special care is required in the management of this patient for the following reasons. This treatment constitutes a Special Treatment Procedure for the following reason: [ Concurrent chemotherapy requiring careful monitoring for increased toxicities of  treatment including weekly laboratory values..  The special nature of the planned course of radiotherapy will require increased physician supervision and oversight to ensure patient's safety with optimal treatment outcomes.  PLAN:  The patient will receive 19.8 Gy in 11 fractions of 1.8 Gy to the bladder tumor with full bladder, then 45 Gy in 25 fractions to the whole bladder and pelvic nodes to a total dose of 64.8 Gy.  ________________________________  Sheral Apley Tammi Klippel, M.D.

## 2020-12-11 DIAGNOSIS — Z5111 Encounter for antineoplastic chemotherapy: Secondary | ICD-10-CM | POA: Diagnosis not present

## 2020-12-11 DIAGNOSIS — C678 Malignant neoplasm of overlapping sites of bladder: Secondary | ICD-10-CM | POA: Diagnosis not present

## 2020-12-11 DIAGNOSIS — C679 Malignant neoplasm of bladder, unspecified: Secondary | ICD-10-CM | POA: Diagnosis not present

## 2020-12-11 DIAGNOSIS — Z510 Encounter for antineoplastic radiation therapy: Secondary | ICD-10-CM | POA: Diagnosis not present

## 2020-12-12 ENCOUNTER — Other Ambulatory Visit: Payer: Self-pay | Admitting: Oncology

## 2020-12-16 ENCOUNTER — Inpatient Hospital Stay: Payer: Medicare Other | Attending: Oncology

## 2020-12-16 ENCOUNTER — Other Ambulatory Visit: Payer: Self-pay

## 2020-12-16 DIAGNOSIS — Z510 Encounter for antineoplastic radiation therapy: Secondary | ICD-10-CM | POA: Insufficient documentation

## 2020-12-16 DIAGNOSIS — Z5111 Encounter for antineoplastic chemotherapy: Secondary | ICD-10-CM | POA: Insufficient documentation

## 2020-12-16 DIAGNOSIS — C679 Malignant neoplasm of bladder, unspecified: Secondary | ICD-10-CM | POA: Insufficient documentation

## 2020-12-17 ENCOUNTER — Encounter: Payer: Self-pay | Admitting: Oncology

## 2020-12-17 ENCOUNTER — Ambulatory Visit
Admission: RE | Admit: 2020-12-17 | Discharge: 2020-12-17 | Disposition: A | Discharge status: Home / Self Care | Payer: Medicare Other | Source: Ambulatory Visit | Attending: Radiation Oncology | Admitting: Radiation Oncology

## 2020-12-17 DIAGNOSIS — Z5111 Encounter for antineoplastic chemotherapy: Secondary | ICD-10-CM | POA: Diagnosis not present

## 2020-12-17 DIAGNOSIS — Z510 Encounter for antineoplastic radiation therapy: Secondary | ICD-10-CM | POA: Diagnosis not present

## 2020-12-17 DIAGNOSIS — C678 Malignant neoplasm of overlapping sites of bladder: Secondary | ICD-10-CM | POA: Diagnosis not present

## 2020-12-17 DIAGNOSIS — C679 Malignant neoplasm of bladder, unspecified: Secondary | ICD-10-CM | POA: Diagnosis not present

## 2020-12-17 NOTE — Progress Notes (Signed)
Pharmacist Chemotherapy Monitoring - Initial Assessment    Anticipated start date: 12/24/20  Regimen:   Are orders appropriate based on the patients diagnosis, regimen, and cycle? Yes  Does the plan date match the patients scheduled date? Yes  Is the sequencing of drugs appropriate? Yes  Are the premedications appropriate for the patients regimen? Yes  Prior Authorization for treatment is: Approved o If applicable, is the correct biosimilar selected based on the patient's insurance? not applicable  Organ Function and Labs:  Are dose adjustments needed based on the patient's renal function, hepatic function, or hematologic function? No  Are appropriate labs ordered prior to the start of patient's treatment? Yes  Other organ system assessment, if indicated: N/A  The following baseline labs, if indicated, have been ordered: N/A  Dose Assessment:  Are the drug doses appropriate? Yes  Are the following correct: o Drug concentrations Yes o IV fluid compatible with drug Yes o Administration routes Yes o Timing of therapy Yes  If applicable, does the patient have documented access for treatment and/or plans for port-a-cath placement? no  If applicable, have lifetime cumulative doses been properly documented and assessed? yes Lifetime Dose Tracking  No doses have been documented on this patient for the following tracked chemicals: Doxorubicin, Epirubicin, Idarubicin, Daunorubicin, Mitoxantrone, Bleomycin, Oxaliplatin, Carboplatin, Liposomal Doxorubicin  o   Toxicity Monitoring/Prevention:  The patient has the following take home antiemetics prescribed: Prochlorperazine  The patient has the following take home medications prescribed: N/A  Medication allergies and previous infusion related reactions, if applicable, have been reviewed and addressed. Yes  The patient's current medication list has been assessed for drug-drug interactions with their chemotherapy regimen. no  significant drug-drug interactions were identified on review.  Order Review:  Are the treatment plan orders signed? Yes  Is the patient scheduled to see a provider prior to their treatment? No  I verify that I have reviewed each item in the above checklist and answered each question accordingly.   Kennith Center, Pharm.D., CPP 12/17/2020@3 :32 PM

## 2020-12-17 NOTE — Progress Notes (Signed)
Pt has 2 insurances so copay assistance shouldn't be needed.  I emailed Ailene Ravel and Vincente Liberty in the radiation dept requesting they meet w/ pt regarding the [employer].

## 2020-12-18 ENCOUNTER — Ambulatory Visit
Admission: RE | Admit: 2020-12-18 | Discharge: 2020-12-18 | Disposition: A | Discharge status: Home / Self Care | Payer: Medicare Other | Source: Ambulatory Visit | Attending: Radiation Oncology | Admitting: Radiation Oncology

## 2020-12-18 ENCOUNTER — Other Ambulatory Visit: Payer: Self-pay

## 2020-12-18 DIAGNOSIS — Z5111 Encounter for antineoplastic chemotherapy: Secondary | ICD-10-CM | POA: Diagnosis not present

## 2020-12-18 DIAGNOSIS — C679 Malignant neoplasm of bladder, unspecified: Secondary | ICD-10-CM | POA: Diagnosis not present

## 2020-12-18 DIAGNOSIS — C678 Malignant neoplasm of overlapping sites of bladder: Secondary | ICD-10-CM | POA: Diagnosis not present

## 2020-12-18 DIAGNOSIS — Z510 Encounter for antineoplastic radiation therapy: Secondary | ICD-10-CM | POA: Diagnosis not present

## 2020-12-21 ENCOUNTER — Ambulatory Visit
Admission: RE | Admit: 2020-12-21 | Discharge: 2020-12-21 | Disposition: A | Discharge status: Home / Self Care | Payer: Medicare Other | Source: Ambulatory Visit | Attending: Radiation Oncology | Admitting: Radiation Oncology

## 2020-12-21 ENCOUNTER — Other Ambulatory Visit: Payer: Self-pay

## 2020-12-21 DIAGNOSIS — C678 Malignant neoplasm of overlapping sites of bladder: Secondary | ICD-10-CM | POA: Diagnosis not present

## 2020-12-21 DIAGNOSIS — I739 Peripheral vascular disease, unspecified: Secondary | ICD-10-CM | POA: Diagnosis not present

## 2020-12-21 DIAGNOSIS — G894 Chronic pain syndrome: Secondary | ICD-10-CM | POA: Diagnosis not present

## 2020-12-21 DIAGNOSIS — C679 Malignant neoplasm of bladder, unspecified: Secondary | ICD-10-CM | POA: Diagnosis not present

## 2020-12-21 DIAGNOSIS — M1710 Unilateral primary osteoarthritis, unspecified knee: Secondary | ICD-10-CM | POA: Diagnosis not present

## 2020-12-21 DIAGNOSIS — Z510 Encounter for antineoplastic radiation therapy: Secondary | ICD-10-CM | POA: Diagnosis not present

## 2020-12-21 DIAGNOSIS — M25551 Pain in right hip: Secondary | ICD-10-CM | POA: Diagnosis not present

## 2020-12-21 DIAGNOSIS — Z5111 Encounter for antineoplastic chemotherapy: Secondary | ICD-10-CM | POA: Diagnosis not present

## 2020-12-22 ENCOUNTER — Ambulatory Visit
Admission: RE | Admit: 2020-12-22 | Discharge: 2020-12-22 | Disposition: A | Discharge status: Home / Self Care | Payer: Medicare Other | Source: Ambulatory Visit | Attending: Radiation Oncology | Admitting: Radiation Oncology

## 2020-12-22 DIAGNOSIS — C678 Malignant neoplasm of overlapping sites of bladder: Secondary | ICD-10-CM | POA: Diagnosis not present

## 2020-12-22 DIAGNOSIS — Z5111 Encounter for antineoplastic chemotherapy: Secondary | ICD-10-CM | POA: Diagnosis not present

## 2020-12-22 DIAGNOSIS — Z510 Encounter for antineoplastic radiation therapy: Secondary | ICD-10-CM | POA: Diagnosis not present

## 2020-12-22 DIAGNOSIS — C679 Malignant neoplasm of bladder, unspecified: Secondary | ICD-10-CM | POA: Diagnosis not present

## 2020-12-23 ENCOUNTER — Telehealth: Payer: Self-pay | Admitting: Oncology

## 2020-12-23 ENCOUNTER — Other Ambulatory Visit: Payer: Self-pay

## 2020-12-23 ENCOUNTER — Ambulatory Visit
Admission: RE | Admit: 2020-12-23 | Discharge: 2020-12-23 | Disposition: A | Discharge status: Home / Self Care | Payer: Medicare Other | Source: Ambulatory Visit | Attending: Radiation Oncology | Admitting: Radiation Oncology

## 2020-12-23 DIAGNOSIS — Z5111 Encounter for antineoplastic chemotherapy: Secondary | ICD-10-CM | POA: Diagnosis not present

## 2020-12-23 DIAGNOSIS — C679 Malignant neoplasm of bladder, unspecified: Secondary | ICD-10-CM | POA: Diagnosis not present

## 2020-12-23 DIAGNOSIS — C678 Malignant neoplasm of overlapping sites of bladder: Secondary | ICD-10-CM | POA: Diagnosis not present

## 2020-12-23 DIAGNOSIS — Z510 Encounter for antineoplastic radiation therapy: Secondary | ICD-10-CM | POA: Diagnosis not present

## 2020-12-23 NOTE — Telephone Encounter (Signed)
Released records to [facility] to [phone]  Release: [phone]

## 2020-12-24 ENCOUNTER — Inpatient Hospital Stay: Payer: Medicare Other

## 2020-12-24 ENCOUNTER — Ambulatory Visit
Admission: RE | Admit: 2020-12-24 | Discharge: 2020-12-24 | Disposition: A | Discharge status: Home / Self Care | Payer: Medicare Other | Source: Ambulatory Visit | Attending: Radiation Oncology | Admitting: Radiation Oncology

## 2020-12-24 VITALS — BP 120/70 | HR 62 | Temp 97.5°F | Resp 20 | Wt 290.0 lb

## 2020-12-24 DIAGNOSIS — C678 Malignant neoplasm of overlapping sites of bladder: Secondary | ICD-10-CM | POA: Diagnosis not present

## 2020-12-24 DIAGNOSIS — Z5111 Encounter for antineoplastic chemotherapy: Secondary | ICD-10-CM | POA: Diagnosis not present

## 2020-12-24 DIAGNOSIS — C679 Malignant neoplasm of bladder, unspecified: Secondary | ICD-10-CM | POA: Diagnosis not present

## 2020-12-24 DIAGNOSIS — C670 Malignant neoplasm of trigone of bladder: Secondary | ICD-10-CM

## 2020-12-24 DIAGNOSIS — Z510 Encounter for antineoplastic radiation therapy: Secondary | ICD-10-CM | POA: Diagnosis not present

## 2020-12-24 LAB — CMP (CANCER CENTER ONLY)
ALT: 12 U/L (ref 0–44)
AST: 12 U/L — ABNORMAL LOW (ref 15–41)
Albumin: 3.4 g/dL — ABNORMAL LOW (ref 3.5–5.0)
Alkaline Phosphatase: 81 U/L (ref 38–126)
Anion gap: 10 (ref 5–15)
BUN: 33 mg/dL — ABNORMAL HIGH (ref 8–23)
CO2: 23 mmol/L (ref 22–32)
Calcium: 8.2 mg/dL — ABNORMAL LOW (ref 8.9–10.3)
Chloride: 111 mmol/L (ref 98–111)
Creatinine: 1.91 mg/dL — ABNORMAL HIGH (ref 0.61–1.24)
GFR, Estimated: 35 mL/min — ABNORMAL LOW (ref >60)
Glucose, Bld: 116 mg/dL — ABNORMAL HIGH (ref 70–99)
Potassium: 4.9 mmol/L (ref 3.5–5.1)
Sodium: 144 mmol/L (ref 135–145)
Total Bilirubin: 0.9 mg/dL (ref 0.3–1.2)
Total Protein: 6.7 g/dL (ref 6.5–8.1)

## 2020-12-24 LAB — CBC WITH DIFFERENTIAL (CANCER CENTER ONLY)
Abs Immature Granulocytes: 0.01 10*3/uL (ref 0.00–0.07)
Basophils Absolute: 0 10*3/uL (ref 0.0–0.1)
Basophils Relative: 1 %
Eosinophils Absolute: 0.1 10*3/uL (ref 0.0–0.5)
Eosinophils Relative: 2 %
HCT: 34.8 % — ABNORMAL LOW (ref 39.0–52.0)
Hemoglobin: 11.3 g/dL — ABNORMAL LOW (ref 13.0–17.0)
Immature Granulocytes: 0 %
Lymphocytes Relative: 14 %
Lymphs Abs: 0.8 10*3/uL (ref 0.7–4.0)
MCH: 30 pg (ref 26.0–34.0)
MCHC: 32.5 g/dL (ref 30.0–36.0)
MCV: 92.3 fL (ref 80.0–100.0)
Monocytes Absolute: 0.5 10*3/uL (ref 0.1–1.0)
Monocytes Relative: 9 %
Neutro Abs: 4 10*3/uL (ref 1.7–7.7)
Neutrophils Relative %: 74 %
Platelet Count: 179 10*3/uL (ref 150–400)
RBC: 3.77 MIL/uL — ABNORMAL LOW (ref 4.22–5.81)
RDW: 14.5 % (ref 11.5–15.5)
WBC Count: 5.4 10*3/uL (ref 4.0–10.5)
nRBC: 0 % (ref 0.0–0.2)

## 2020-12-24 MED ORDER — PALONOSETRON HCL INJECTION 0.25 MG/5ML
0.2500 mg | Freq: Once | INTRAVENOUS | Status: AC
  Administered 2020-12-24: 0.25 mg via INTRAVENOUS

## 2020-12-24 MED ORDER — SODIUM CHLORIDE 0.9 % IV SOLN
Freq: Once | INTRAVENOUS | Status: AC
  Filled 2020-12-24: qty 250

## 2020-12-24 MED ORDER — PALONOSETRON HCL INJECTION 0.25 MG/5ML
INTRAVENOUS | Status: AC
  Filled 2020-12-24: qty 5

## 2020-12-24 MED ORDER — SODIUM CHLORIDE 0.9 % IV SOLN
110.0000 mg | Freq: Once | INTRAVENOUS | Status: AC
  Administered 2020-12-24: 110 mg via INTRAVENOUS
  Filled 2020-12-24: qty 11

## 2020-12-24 MED ORDER — SODIUM CHLORIDE 0.9 % IV SOLN
10.0000 mg | Freq: Once | INTRAVENOUS | Status: AC
  Administered 2020-12-24: 10 mg via INTRAVENOUS
  Filled 2020-12-24: qty 10

## 2020-12-24 NOTE — Progress Notes (Signed)
MD states okay to treat with creat 1.93

## 2020-12-24 NOTE — Patient Instructions (Signed)
[facility] Discharge Instructions for Patients Receiving Chemotherapy  Today you received the following chemotherapy agents Carboplatin  To help prevent nausea and vomiting after your treatment, we encourage you to take your nausea medication as directed   If you develop nausea and vomiting that is not controlled by your nausea medication, call the clinic.   BELOW ARE SYMPTOMS THAT SHOULD BE REPORTED IMMEDIATELY:  *FEVER GREATER THAN 100.5 F  *CHILLS WITH OR WITHOUT FEVER  NAUSEA AND VOMITING THAT IS NOT CONTROLLED WITH YOUR NAUSEA MEDICATION  *UNUSUAL SHORTNESS OF BREATH  *UNUSUAL BRUISING OR BLEEDING  TENDERNESS IN MOUTH AND THROAT WITH OR WITHOUT PRESENCE OF ULCERS  *URINARY PROBLEMS  *BOWEL PROBLEMS  UNUSUAL RASH Items with * indicate a potential emergency and should be followed up as soon as possible.  Feel free to call the clinic should you have any questions or concerns. The clinic phone number is [phone].  Please show the CHEMO ALERT CARD at check-in to the Emergency Department and triage nurse.   

## 2020-12-25 ENCOUNTER — Other Ambulatory Visit: Payer: Self-pay

## 2020-12-25 ENCOUNTER — Ambulatory Visit
Admission: RE | Admit: 2020-12-25 | Discharge: 2020-12-25 | Disposition: A | Discharge status: Home / Self Care | Payer: Medicare Other | Source: Ambulatory Visit | Attending: Radiation Oncology | Admitting: Radiation Oncology

## 2020-12-25 DIAGNOSIS — C679 Malignant neoplasm of bladder, unspecified: Secondary | ICD-10-CM | POA: Diagnosis not present

## 2020-12-25 DIAGNOSIS — Z510 Encounter for antineoplastic radiation therapy: Secondary | ICD-10-CM | POA: Insufficient documentation

## 2020-12-25 DIAGNOSIS — C678 Malignant neoplasm of overlapping sites of bladder: Secondary | ICD-10-CM | POA: Diagnosis not present

## 2020-12-28 ENCOUNTER — Other Ambulatory Visit: Payer: Self-pay

## 2020-12-28 ENCOUNTER — Ambulatory Visit
Admission: RE | Admit: 2020-12-28 | Discharge: 2020-12-28 | Disposition: A | Discharge status: Home / Self Care | Payer: Medicare Other | Source: Ambulatory Visit | Attending: Radiation Oncology | Admitting: Radiation Oncology

## 2020-12-28 DIAGNOSIS — C679 Malignant neoplasm of bladder, unspecified: Secondary | ICD-10-CM | POA: Diagnosis not present

## 2020-12-28 DIAGNOSIS — C678 Malignant neoplasm of overlapping sites of bladder: Secondary | ICD-10-CM | POA: Diagnosis not present

## 2020-12-28 DIAGNOSIS — R8271 Bacteriuria: Secondary | ICD-10-CM | POA: Diagnosis not present

## 2020-12-28 DIAGNOSIS — R8279 Other abnormal findings on microbiological examination of urine: Secondary | ICD-10-CM | POA: Diagnosis not present

## 2020-12-28 DIAGNOSIS — C670 Malignant neoplasm of trigone of bladder: Secondary | ICD-10-CM | POA: Diagnosis not present

## 2020-12-28 DIAGNOSIS — N130 Hydronephrosis with ureteropelvic junction obstruction: Secondary | ICD-10-CM | POA: Diagnosis not present

## 2020-12-28 DIAGNOSIS — Z510 Encounter for antineoplastic radiation therapy: Secondary | ICD-10-CM | POA: Diagnosis not present

## 2020-12-29 ENCOUNTER — Ambulatory Visit
Admission: RE | Admit: 2020-12-29 | Discharge: 2020-12-29 | Disposition: A | Discharge status: Home / Self Care | Payer: Medicare Other | Source: Ambulatory Visit | Attending: Radiation Oncology | Admitting: Radiation Oncology

## 2020-12-29 DIAGNOSIS — Z510 Encounter for antineoplastic radiation therapy: Secondary | ICD-10-CM | POA: Diagnosis not present

## 2020-12-29 DIAGNOSIS — C678 Malignant neoplasm of overlapping sites of bladder: Secondary | ICD-10-CM | POA: Diagnosis not present

## 2020-12-29 DIAGNOSIS — C679 Malignant neoplasm of bladder, unspecified: Secondary | ICD-10-CM | POA: Diagnosis not present

## 2020-12-30 ENCOUNTER — Encounter: Payer: Self-pay | Admitting: Cardiology

## 2020-12-30 ENCOUNTER — Other Ambulatory Visit: Payer: Self-pay

## 2020-12-30 ENCOUNTER — Ambulatory Visit
Admission: RE | Admit: 2020-12-30 | Discharge: 2020-12-30 | Disposition: A | Discharge status: Home / Self Care | Payer: Medicare Other | Source: Ambulatory Visit | Attending: Radiation Oncology | Admitting: Radiation Oncology

## 2020-12-30 ENCOUNTER — Ambulatory Visit (INDEPENDENT_AMBULATORY_CARE_PROVIDER_SITE_OTHER): Payer: Medicare Other | Admitting: Cardiology

## 2020-12-30 VITALS — BP 109/68 | HR 55 | Ht 73.0 in | Wt 315.8 lb

## 2020-12-30 DIAGNOSIS — I5032 Chronic diastolic (congestive) heart failure: Secondary | ICD-10-CM | POA: Diagnosis not present

## 2020-12-30 DIAGNOSIS — I10 Essential (primary) hypertension: Secondary | ICD-10-CM | POA: Diagnosis not present

## 2020-12-30 DIAGNOSIS — Z7689 Persons encountering health services in other specified circumstances: Secondary | ICD-10-CM | POA: Diagnosis not present

## 2020-12-30 DIAGNOSIS — Z510 Encounter for antineoplastic radiation therapy: Secondary | ICD-10-CM | POA: Diagnosis not present

## 2020-12-30 DIAGNOSIS — C678 Malignant neoplasm of overlapping sites of bladder: Secondary | ICD-10-CM | POA: Diagnosis not present

## 2020-12-30 DIAGNOSIS — I272 Pulmonary hypertension, unspecified: Secondary | ICD-10-CM | POA: Diagnosis not present

## 2020-12-30 DIAGNOSIS — I872 Venous insufficiency (chronic) (peripheral): Secondary | ICD-10-CM

## 2020-12-30 DIAGNOSIS — C679 Malignant neoplasm of bladder, unspecified: Secondary | ICD-10-CM | POA: Diagnosis not present

## 2020-12-30 DIAGNOSIS — H6121 Impacted cerumen, right ear: Secondary | ICD-10-CM | POA: Diagnosis not present

## 2020-12-30 DIAGNOSIS — G4733 Obstructive sleep apnea (adult) (pediatric): Secondary | ICD-10-CM | POA: Diagnosis not present

## 2020-12-30 DIAGNOSIS — Z9989 Dependence on other enabling machines and devices: Secondary | ICD-10-CM

## 2020-12-30 DIAGNOSIS — I4821 Permanent atrial fibrillation: Secondary | ICD-10-CM

## 2020-12-30 NOTE — Patient Instructions (Addendum)
Medication Instructions:  No changes *If you need a refill on your cardiac medications before your next appointment, please call your pharmacy*   Lab Work: Not needed   Testing/Procedures: Not needed  Follow-Up: At [facility], you and your health needs are our priority.  As part of our continuing mission to provide you with exceptional heart care, we have created designated Provider Care Teams.  These Care Teams include your primary Cardiologist (physician) and Advanced Practice Providers (APPs -  Physician Assistants and Nurse Practitioners) who all work together to provide you with the care you need, when you need it.    Your next appointment:   6 to7 month(s)  Oct to Nov 2022  The format for your next appointment:   In Person  Provider:   Glenetta Hew, MD

## 2020-12-30 NOTE — Progress Notes (Signed)
Primary Care Provider: Lujean Amel, MD Cardiologist: Glenetta Hew, MD Electrophysiologist: None  Urologist: Dr. Lovena Neighbours Oncologist: Dr. Alen Blew; Radiation Oncologist: Dr. Tammi Klippel  Clinic Note: Chief Complaint  Patient presents with  . Follow-up    3 months.  New diagnosis of bladder cancer status post TURP now on XRT/chemo.  Nocturia has gone down after bladder cancer surgery  . Atrial Fibrillation    Stable.  Asymptomatic.  . Edema    Stable.  Currently taking 60 mg morning and 40 in the afternoon.  [name] no longerListed.   ===================================  ASSESSMENT/PLAN   Problem List Items Addressed This Visit    Permanent atrial fibrillation ([place]); CHA2DS2-VASc score =5 (agex2, aortic plaque, CHF, HTN) - Primary (Chronic)    I think he still is on metoprolol given his heart rate 55 bpm.  Is not listed, but I do not see where this was discontinued.  Continue current dosing at 25 mg morning and 37.5 mg p.m. Is on Xarelto for anticoagulation.  No bleeding issues.  Okay to hold Xarelto for any surgery. -> Standard will be to hold 2 days preop for most of his procedures or surgeries, 3 days for more high risk (bleeding) procedures      Relevant Orders   EKG 12-Lead (Completed)   Morbid obesity ([place]) (Chronic)    Difficult for him to exercise.  Discussed importance of dietary modification.  In addition to avoiding excess salt in his diet, should also simply cut down calorie intake.      Chronic diastolic HF (heart failure) (HCC) (Chronic)    Stable orthopnea more related to obesity.  Needs to use CPAP.  Most exacerbated if he is in A. fib RVR which has not occurred in a long time.  Diuretics per nephrology.      Relevant Orders   EKG 12-Lead (Completed)   Chronic venous insufficiency (Chronic)    Chronic anemia, stable.  Discussed use of Ace wraps, foot elevation and possibly support stockings.  Wonder if he would benefit from pneumatic sequential  compression devices.  Diuretics have been managed by nephrology.      Essential hypertension (Chronic)    Has had some hypotension issues in the past we will see the amlodipine dose was discontinued.  Need to confirm if he is on beta-blocker or not.  Not listed on his current list.  But heart rate would indicate that he is taking beta-blocker.      OSA on CPAP (Chronic)    Using COVID since his pneumonia back in November 2020.  Needs to be reevaluated by pulmonary medicine to adjust settings.      Pulmonary hypertension (HCC) (Chronic)    Combination of obesity, OSA/OHS with some HFpEF complicated by A. Fib.  No longer on amlodipine (not listed).   Sleeps in recliner for comfort.  Needs to use CPAP.  Defer to pulmonary medicine.      Relevant Orders   EKG 12-Lead (Completed)      ===================================  HPI:    Patrick Bishop is a 80 y.o. male with a PMH notable for PERMANENT ATRIAL FIBRILLATION, CHRONIC LOWER EXTREMITY EDEMA-VENOUS INSUFFICIENCY/LYMPHEDEMA, OSA-unable to tolerate CPAP, CKD 3a,  history of NEPHROLITHIASIS with new diagnosis of BLADDER CANCER who presents today for 11-month follow-up.   Patrick Bishop was last seen by me on May 29, 2020.  He was doing fairly well at that time.  Stable weights.  Occasionally felt dizzy with low blood pressures.  Amlodipine held on occasion.  Was  not yet fully recovered from COVID-19.  Diuretic was down to 40 mg twice daily torsemide with [name] maybe twice a week.  To 3 weeks prior to clinic visit, his PCP upped his [name] to 3 days a week for worsening edema.  Sleeping on a recliner.  Not tolerating CPAP.  Felt his pressures were too high. -> Was on Toprol tartrate 25 mg a.m.-37.5 mg p.m.;  Recommended standing dose metolazone 2 days a week with additional doses PRN.;  Continue wraps and support stockings along with elevation.  Recent Hospitalizations:   ER visit 08/12/2020: Mechanical fall.  Walking down  a ramp, pushing his walker that "got away from him ".  He fell, landed on right knee and right wrist.  Eventually hit his head on the ground.  No LOC.  Able to get up with assistance.-Renal ultrasound performed.  (PCP recently noted worsening renal function)  Ultrasound showed severe left hydronephrosis; renal CT showed moderate left hydronephrosis possibly secondary to left UVJ occlusion by bladder mass.  He was seen by Rosalyn Gess, [place] in January 2022 for preop evaluation for cystoscopy.  At that point he was rate controlled without AV nodal agent, and anticoagulated with Xarelto.  Compensated HFpEF chronic LE edema.  Apparently he stopped using his CPAP after COVID-19 pneumonia in November 2020. => Cleared for urologic procedure.   10/21/2020: Admitted for TURP   Reviewed  CV studies:    The following studies were reviewed today: (if available, images/films reviewed: From Epic Chart or Care Everywhere) . None:   Interval History:   Patrick Bishop presents today for 57-month follow-up recheck with more cardiac standpoint.  No real sensation of being in A. fib.  Has not really had any worsening orthopnea with chronic edema.  A little bit confused about his medications, [name] no longer listed, and metoprolol tartrate is not listed as well although it appears that he is taking it.  Diuretics have been somewhat managed by his nephrologist.  He denies any chest pain or pressure with rest or exertion.  He says his nocturnal frequency is typically improved with his bladder treatment.  Seems to do relatively well with his chemotherapy and radiation, just a bit worn out. Stable exertional dyspnea, deconditioning.  Still using walker.  CV Review of Symptoms (Summary): positive for - dyspnea on exertion, edema and Both stable negative for - chest pain, irregular heartbeat, orthopnea, palpitations, paroxysmal nocturnal dyspnea, rapid heart rate, shortness of breath or Syncope or near syncope, TIA or  amaurosis fugax symptoms.  Not walking enough to note any claudication  The patient does not have symptoms concerning for COVID-19 infection (fever, chills, cough, or new shortness of breath).   REVIEWED OF SYSTEMS   Review of Systems  Constitutional: Positive for malaise/fatigue (80 year old with chemotherapy and radiation) and weight loss (A little bit, but not that much.  For most part stable.).  HENT: Negative for congestion and sinus pain.   Respiratory: Positive for shortness of breath (Exertion.  Stable.). Negative for cough.   Gastrointestinal: Negative for blood in stool and melena.  Genitourinary: Negative for frequency (Improvement.) and hematuria (Not anymore).  Musculoskeletal: Positive for back pain, falls (Last fall was in November) and joint pain.  Neurological: Positive for dizziness (Positional) and weakness (Generalized). Negative for headaches.  Psychiatric/Behavioral: Positive for memory loss. Negative for depression. The patient has insomnia. The patient is not nervous/anxious.    I have reviewed and (if needed) personally updated the patient's problem list, medications, allergies, past medical and  surgical history, social and family history.   PAST MEDICAL HISTORY   Past Medical History:  Diagnosis Date  . AAA (abdominal aortic aneurysm) ([place] )    3.4cm by [place] 07/2020  . Arthritis   . Cancer of trigone of urinary bladder ([place]) 11/05/2020  . Chronic diastolic heart failure (HCC)    Normal LV Fxn by Echo 04/2017   . Chronic venous insufficiency 2015   s/p Bilateral Iliac & Femoral Stents (including IVC) - DEEP Venous Reflux.  Also s/p Bilateral GSV Ablation.  [name] Dyspnea   . Essential hypertension   . History of kidney stones   . Hx of adenomatous colonic polyps   . Hyperlipidemia with target LDL less than 100   . Longstanding persistent atrial fibrillation ([place])   . Nephrolithiasis 05/2019   CKD stage 3  . Obesity   . OSA on CPAP    does not cpap   . Pneumonia  08/2019  . Pre-diabetes   . Pulmonary hypertension (HCC)    Related to OSA, partial component of HFpEF  . Pyelonephritis 05/2019    PAST SURGICAL HISTORY   Past Surgical History:  Procedure Laterality Date  . ABDOMINAL VENOUS DUPLEX Bilateral 07/2014   ([place]. Cardiology: Dr. Cleda Mccreedy): (prior to Bilateral Iliac-Femoral Venous Stent Placement): Patent IVC.  No DVT B/L Iliac or Com fem V..  +++ DEEP V REFLUX B/L ILIAC & FEM V.  Absent Superficial V Reflux B/L x L SSV (too tortuous for intervention).  BL GSV occluded - prior ablation.   . ABDOMINAL VENOUS DUPLEX Bilateral 11/2014   Absent DVT.  Left common femoral venous reflux: 1508 MS left common femoral vein, 2284MS left proximal femoral vein -> cystic structure noted in the left popliteal vein (7.6 x 1.4 x 3.8 cm).;  Right leg deep venous reflux noted in the distal iliac, common femoral, proximal profunda femoral, proximal superficial femoral, superficial femoral and popliteal vein.  [name] CARDIAC CATHETERIZATION  10/2009   [facility] Cardiology - Dr. 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  EF~45%.  Mild pulmonary pretension.  [name] CARDIAC PET  08/2009   Inferolateral partially reversible defect -- > false positive by cath  . COLONOSCOPY    . CYSTOSCOPY/URETEROSCOPY/HOLMIUM LASER/STENT PLACEMENT Left 11/06/2019   Procedure: CYSTOSCOPY/RETROGRADE/URETEROSCOPY/HOLMIUM LASER/STENT PLACEMENT. REMOVAL NEPHROSCOPYTUBE;  Surgeon: Ceasar Mons, MD;  Location: [facility];  Service: Urology;  Laterality: Left;  . ESOPHAGOGASTRODUODENOSCOPY    . GREATER SAPHENOUS VEIN ABLATION Bilateral Before 2015   [facility] (Dr. Eugenie Filler)  . IR NEPHROSTOMY EXCHANGE LEFT  08/28/2019  . IR NEPHROSTOMY PLACEMENT LEFT  06/24/2019  . IR TRANSCATH PLC STENT  EA ADD VEIN  INC ANGIOPLASTY Left 10/2014   [facility], Dr. 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 IR TRANSCATH PLC STENT  INITIAL VEIN  INC ANGIOPLASTY Right 08/2014   [facility] - Dr. Stormy Fabian): US Guided --> R Common & External Iliac, Common Femoral Venography with Placement of 24 mmx 70 mm Stent - R Com Iliac-Ext Iliac- & Com Fem V. reducing 60-80% venous compression to ~0%).;; also noted significant L-sided venous compression (staged stenting).   [name] LIPOMA EXCISION     x 2 came back again  . NM MYOVIEW  LTD  11/2005   Adenosine Myoview [facility] Cardiology - Dr. Cleda Mccreedy): Normal myocardial perfusion scan. No ischemia or infarction.  . TRANSTHORACIC ECHOCARDIOGRAM  04/26/2017    [facility] Cardiology - Dr. Cleda Mccreedy) - report not available.  Per clinic note: Normal global LV function. Mild concentric temperature. Mild TR. Mild pulmonary hypertension.;;   . TRANSTHORACIC ECHOCARDIOGRAM  7/'15; 7/'17   [facility] Cardiology - Dr. Saverio Danker) a) low normal LV function (EF 56%). Moderate concentric LVH. Mild LA dilation. Normal pulmonary pressures.  ;; b) normal global function - EF 60-65%. Moderate pulmonary hypertension. Biatrial enlargement. Moderate concentric LVH.  [name] TRANSTHORACIC ECHOCARDIOGRAM  02/2010   [facility] Cardiology - Dr. Saverio Danker): Normal global LV function. Mild LVH. EF 60%.; November 2010. Moderate LV dilation. Moderate LVH. Normal EF 50-55%. Mild to moderate pulmonary hypertension. (July 2012 showed normal function with mild LVH, mild MR noted in August 2014)  . TRANSTHORACIC ECHOCARDIOGRAM  05/2019   During admission for UTI, sepsis with A. fib RVR: LV normal size and function.  EF 60-65%.  Mildly reduced RV size with severely reduced function.  Despite this normal atrial size noted with just only mild right atrial enlargement with elevated RAP (dilated IVC).  Relatively poor TR jet, but findings do suggest elevated RV  pressures.  . TRANSURETHRAL RESECTION OF BLADDER TUMOR Left 10/21/2020   Procedure: TRANSURETHRAL RESECTION OF BLADDER TUMOR (TURBT)/ RETROGRADE/  URETERAL STENT PLACEMENT;  Surgeon: Ceasar Mons, MD;  Location: [facility];  Service: Urology;  Laterality: Left;    Immunization History  Administered Date(s) Administered  . Moderna Sars-Covid-2 Vaccination 10/28/2019, 11/25/2019  . Tdap 08/12/2020    MEDICATIONS/ALLERGIES   Current Meds  Medication Sig  . atorvastatin (LIPITOR) 10 MG tablet TAKE 1 TABLET DAILY (Patient taking differently: Take 10 mg by mouth daily.)  . HYDROcodone-acetaminophen (NORCO/VICODIN) 5-325 MG tablet Take 1 tablet by mouth 2 (two) times daily as needed for moderate pain. (Patient taking differently: Take 1 tablet by mouth every 6 (six) hours as needed for moderate pain.)  . magnesium oxide (MAG-OX) 400 (241.3 Mg) MG tablet Take 1 tablet (400 mg total) by mouth daily.  [name] oxybutynin (DITROPAN) 5 MG tablet Take 1 tablet (5 mg total) by mouth every 8 (eight) hours as needed for bladder spasms. (Patient not taking: No sig reported)  . phenazopyridine (PYRIDIUM) 200 MG tablet Take 1 tablet (200 mg total) by mouth 3 (three) times daily as needed (for pain with urination). (Patient not taking: No sig reported)  . potassium chloride SA (KLOR-CON) 20 MEQ tablet Take 1 tablet (20 mEq total) by mouth 2 (two) times daily.  . prochlorperazine (COMPAZINE) 10 MG tablet TAKE 1 TABLET(10 MG) BY MOUTH EVERY 6 HOURS AS NEEDED FOR NAUSEA OR VOMITING (Patient taking differently: Take 10 mg by mouth every 6 (six) hours as needed for nausea or vomiting.)  . torsemide (DEMADEX) 20 MG tablet Take 40-60 mg by mouth See admin instructions. Take 60 mg in the morning, 40 mg in the afternoon  . XARELTO 20 MG TABS tablet TAKE 1 TABLET DAILY WITH SUPPER (Patient taking differently: Take 20 mg by mouth daily with supper.)  He was also taking Toprol tartrate 25 mg every morning and 37.5 mg  p.m.   Allergies  Allergen Reactions  . Aldactone [Spironolactone] Other (See Comments)    unknown     SOCIAL HISTORY/FAMILY HISTORY   Reviewed in Epic:  Pertinent findings:  Social History   Tobacco Use  . Smoking status: Former Smoker  Packs/day: 1.00    Years: 35.00    Pack years: 35.00    Types: Cigarettes    Quit date: 1996    Years since quitting: 26.3  . Smokeless tobacco: Never Used  . Tobacco comment: smoked from age [age]   Vaping Use  . Vaping Use: Never used  Substance Use Topics  . Alcohol use: Yes    Comment: occasional  . Drug use: No   Social History   Social History Narrative   How[place] recently moved to [place] in January 2019.  This was to be close to his daughter who is here with him today.  He has 2 daughters and one grandchild.      Jacqualine Code,  [place] --PCP was Sherlynn Carbon, MD.  Cardiologist: Saverio Danker, MD (who followed him every 3 months with routine visits.  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   He currently lives at [place] [facility] (Dr. Albesa Seen) --however he hopes to be moving into his daughter's house once they have not established and ready for him.    OBJCTIVE -PE, EKG, labs   Wt Readings from Last 3 Encounters:  12/30/20 (!) 315 lb 12.8 oz (143.2 kg)  09/28/20  323 pounds (146.5 kg  05/29/20          328 pounds 148 review daily)  Physical Exam: BP 109/68   Pulse (!) 55   Ht 6\' 1"  (1.854 m)   Wt (!) 315 lb 12.8 oz (143.2 kg)   SpO2 98%   BMI 41.66 kg/m  Physical Exam Vitals reviewed.  Constitutional:      General: He is not in acute distress.    Appearance: Normal appearance. He is obese. He is not ill-appearing or toxic-appearing.  HENT:     Head: Normocephalic and atraumatic.  Neck:     Vascular: Hepatojugular reflux present. No carotid bruit or JVD (7 cm H2O).  Cardiovascular:     Rate and  Rhythm: Bradycardia present. Rhythm irregularly irregular.  No extrasystoles are present.    Chest Wall: PMI is not displaced (Unable to palpate).     Pulses: Decreased pulses (Very difficult to palpate due to significant edema.).     Heart sounds: S1 normal and S2 normal. Heart sounds are distant. No murmur heard. No friction rub. No gallop.   Pulmonary:     Effort: Pulmonary effort is normal. No respiratory distress.     Breath sounds: Normal breath sounds.  Chest:     Chest wall: No tenderness.  Musculoskeletal:        General: Swelling (Stable 2-3+ bilateral LE edema.) present.     Cervical back: Normal range of motion and neck supple.  Skin:    General: Skin is warm and dry.  Neurological:     General: No focal deficit present.     Mental Status: He is alert and oriented to person, place, and time.  Psychiatric:        Mood and Affect: Mood normal.        Behavior: Behavior normal.        Thought Content: Thought content normal.        Judgment: Judgment normal.     Adult ECG Report  Rate: 55 ;  Rhythm: atrial fibrillation; normal axis, intervals and durations.  Borderline low voltage.  Narrative Interpretation: Stable  Recent Labs: Last lipid panel 01/27/2020: TC 108, TG 87, HDL  31, LDL-C 59.  A1c 10/15/2020 5.4;   Cr 10/29/2020 2.1 Lab Results  Component Value Date   TRIG 57 08/02/2019   No results found for: TSH  ==================================================  COVID-19 Education: The signs and symptoms of COVID-19 were discussed with the patient and how to seek care for testing (follow up with PCP or arrange E-visit).   The importance of social distancing and COVID-19 vaccination was discussed today. The patient is practicing social distancing & Masking.   I spent a total of 22 minutes with the patient spent in direct patient consultation.  Additional time spent with chart review  / charting (studies, outside notes, etc): 19 min -> he has had waxing events since  I last saw him.  Chart reviewed. Total Time: 41 min  Current medicines are reviewed at length with the patient today.  (+/- concerns) none  This visit occurred during the SARS-CoV-2 public health emergency.  Safety protocols were in place, including screening questions prior to the visit, additional usage of staff PPE, and extensive cleaning of exam room while observing appropriate contact time as indicated for disinfecting solutions.  Notice: This dictation was prepared with Dragon dictation along with smaller phrase technology. Any transcriptional errors that result from this process are unintentional and may not be corrected upon review.  Patient Instructions / Medication Changes & Studies & Tests Ordered   Patient Instructions  Medication Instructions:  No changes *If you need a refill on your cardiac medications before your next appointment, please call your pharmacy*   Lab Work: Not needed   Testing/Procedures: Not needed  Follow-Up: At [facility], you and your health needs are our priority.  As part of our continuing mission to provide you with exceptional heart care, we have created designated Provider Care Teams.  These Care Teams include your primary Cardiologist (physician) and Advanced Practice Providers (APPs -  Physician Assistants and Nurse Practitioners) who all work together to provide you with the care you need, when you need it.    Your next appointment:   6 to7 month(s)  Oct to Nov 2022  The format for your next appointment:   In Person  Provider:   Glenetta Hew, MD       Studies Ordered:   Orders Placed This Encounter  Procedures  . EKG 12-Lead     Glenetta Hew, M.D., M.S. Interventional Cardiologist   Pager # [phone] Phone # [phone] [address]. [place], [place] [address]   Thank you for choosing Heartcare at [facility]!!

## 2020-12-31 ENCOUNTER — Inpatient Hospital Stay: Payer: Medicare Other

## 2020-12-31 ENCOUNTER — Ambulatory Visit
Admission: RE | Admit: 2020-12-31 | Discharge: 2020-12-31 | Disposition: A | Discharge status: Home / Self Care | Payer: Medicare Other | Source: Ambulatory Visit | Attending: Radiation Oncology | Admitting: Radiation Oncology

## 2020-12-31 VITALS — BP 123/75 | HR 64 | Temp 97.6°F | Resp 20

## 2020-12-31 DIAGNOSIS — C670 Malignant neoplasm of trigone of bladder: Secondary | ICD-10-CM

## 2020-12-31 DIAGNOSIS — Z510 Encounter for antineoplastic radiation therapy: Secondary | ICD-10-CM | POA: Diagnosis not present

## 2020-12-31 DIAGNOSIS — Z5111 Encounter for antineoplastic chemotherapy: Secondary | ICD-10-CM | POA: Insufficient documentation

## 2020-12-31 DIAGNOSIS — C679 Malignant neoplasm of bladder, unspecified: Secondary | ICD-10-CM | POA: Insufficient documentation

## 2020-12-31 DIAGNOSIS — C678 Malignant neoplasm of overlapping sites of bladder: Secondary | ICD-10-CM | POA: Diagnosis not present

## 2020-12-31 LAB — CMP (CANCER CENTER ONLY)
ALT: 10 U/L (ref 0–44)
AST: 11 U/L — ABNORMAL LOW (ref 15–41)
Albumin: 3.6 g/dL (ref 3.5–5.0)
Alkaline Phosphatase: 75 U/L (ref 38–126)
Anion gap: 12 (ref 5–15)
BUN: 34 mg/dL — ABNORMAL HIGH (ref 8–23)
CO2: 23 mmol/L (ref 22–32)
Calcium: 8.3 mg/dL — ABNORMAL LOW (ref 8.9–10.3)
Chloride: 107 mmol/L (ref 98–111)
Creatinine: 1.9 mg/dL — ABNORMAL HIGH (ref 0.61–1.24)
GFR, Estimated: 35 mL/min — ABNORMAL LOW (ref >60)
Glucose, Bld: 98 mg/dL (ref 70–99)
Potassium: 4.3 mmol/L (ref 3.5–5.1)
Sodium: 142 mmol/L (ref 135–145)
Total Bilirubin: 1 mg/dL (ref 0.3–1.2)
Total Protein: 6.9 g/dL (ref 6.5–8.1)

## 2020-12-31 LAB — CBC WITH DIFFERENTIAL (CANCER CENTER ONLY)
Abs Immature Granulocytes: 0.02 10*3/uL (ref 0.00–0.07)
Basophils Absolute: 0 10*3/uL (ref 0.0–0.1)
Basophils Relative: 1 %
Eosinophils Absolute: 0.1 10*3/uL (ref 0.0–0.5)
Eosinophils Relative: 2 %
HCT: 35.1 % — ABNORMAL LOW (ref 39.0–52.0)
Hemoglobin: 11.6 g/dL — ABNORMAL LOW (ref 13.0–17.0)
Immature Granulocytes: 0 %
Lymphocytes Relative: 13 %
Lymphs Abs: 0.7 10*3/uL (ref 0.7–4.0)
MCH: 30.7 pg (ref 26.0–34.0)
MCHC: 33 g/dL (ref 30.0–36.0)
MCV: 92.9 fL (ref 80.0–100.0)
Monocytes Absolute: 0.6 10*3/uL (ref 0.1–1.0)
Monocytes Relative: 11 %
Neutro Abs: 4 10*3/uL (ref 1.7–7.7)
Neutrophils Relative %: 73 %
Platelet Count: 158 10*3/uL (ref 150–400)
RBC: 3.78 MIL/uL — ABNORMAL LOW (ref 4.22–5.81)
RDW: 14.6 % (ref 11.5–15.5)
WBC Count: 5.5 10*3/uL (ref 4.0–10.5)
nRBC: 0 % (ref 0.0–0.2)

## 2020-12-31 MED ORDER — SODIUM CHLORIDE 0.9 % IV SOLN
Freq: Once | INTRAVENOUS | Status: AC
  Filled 2020-12-31: qty 250

## 2020-12-31 MED ORDER — PALONOSETRON HCL INJECTION 0.25 MG/5ML
INTRAVENOUS | Status: AC
  Filled 2020-12-31: qty 5

## 2020-12-31 MED ORDER — PALONOSETRON HCL INJECTION 0.25 MG/5ML
0.2500 mg | Freq: Once | INTRAVENOUS | Status: AC
  Administered 2020-12-31: 0.25 mg via INTRAVENOUS

## 2020-12-31 MED ORDER — SODIUM CHLORIDE 0.9 % IV SOLN
110.0000 mg | Freq: Once | INTRAVENOUS | Status: AC
  Administered 2020-12-31: 110 mg via INTRAVENOUS
  Filled 2020-12-31: qty 11

## 2020-12-31 MED ORDER — DEXAMETHASONE SODIUM PHOSPHATE 100 MG/10ML IJ SOLN
10.0000 mg | Freq: Once | INTRAMUSCULAR | Status: AC
  Administered 2020-12-31: 10 mg via INTRAVENOUS
  Filled 2020-12-31: qty 10

## 2020-12-31 NOTE — Patient Instructions (Signed)
Cancer Center Discharge Instructions for Patients Receiving Chemotherapy  Today you received the following chemotherapy agents Carboplatin  To help prevent nausea and vomiting after your treatment, we encourage you to take your nausea medication as directed   If you develop nausea and vomiting that is not controlled by your nausea medication, call the clinic.   BELOW ARE SYMPTOMS THAT SHOULD BE REPORTED IMMEDIATELY:  *FEVER GREATER THAN 100.5 F  *CHILLS WITH OR WITHOUT FEVER  NAUSEA AND VOMITING THAT IS NOT CONTROLLED WITH YOUR NAUSEA MEDICATION  *UNUSUAL SHORTNESS OF BREATH  *UNUSUAL BRUISING OR BLEEDING  TENDERNESS IN MOUTH AND THROAT WITH OR WITHOUT PRESENCE OF ULCERS  *URINARY PROBLEMS  *BOWEL PROBLEMS  UNUSUAL RASH Items with * indicate a potential emergency and should be followed up as soon as possible.  Feel free to call the clinic should you have any questions or concerns. The clinic phone number is [phone].  Please show the CHEMO ALERT CARD at check-in to the Emergency Department and triage nurse.   

## 2020-12-31 NOTE — Progress Notes (Signed)
Per Dr. Alen Blew, okay to proceed with creatine 1.90.

## 2021-01-01 ENCOUNTER — Other Ambulatory Visit: Payer: Self-pay

## 2021-01-01 ENCOUNTER — Ambulatory Visit
Admission: RE | Admit: 2021-01-01 | Discharge: 2021-01-01 | Disposition: A | Discharge status: Home / Self Care | Payer: Medicare Other | Source: Ambulatory Visit | Attending: Radiation Oncology | Admitting: Radiation Oncology

## 2021-01-01 DIAGNOSIS — C679 Malignant neoplasm of bladder, unspecified: Secondary | ICD-10-CM | POA: Diagnosis not present

## 2021-01-01 DIAGNOSIS — C678 Malignant neoplasm of overlapping sites of bladder: Secondary | ICD-10-CM | POA: Diagnosis not present

## 2021-01-01 DIAGNOSIS — Z510 Encounter for antineoplastic radiation therapy: Secondary | ICD-10-CM | POA: Diagnosis not present

## 2021-01-04 ENCOUNTER — Ambulatory Visit
Admission: RE | Admit: 2021-01-04 | Discharge: 2021-01-04 | Disposition: A | Discharge status: Home / Self Care | Payer: Medicare Other | Source: Ambulatory Visit | Attending: Radiation Oncology | Admitting: Radiation Oncology

## 2021-01-04 ENCOUNTER — Other Ambulatory Visit: Payer: Self-pay

## 2021-01-04 DIAGNOSIS — C678 Malignant neoplasm of overlapping sites of bladder: Secondary | ICD-10-CM | POA: Diagnosis not present

## 2021-01-04 DIAGNOSIS — Z510 Encounter for antineoplastic radiation therapy: Secondary | ICD-10-CM | POA: Diagnosis not present

## 2021-01-04 DIAGNOSIS — C679 Malignant neoplasm of bladder, unspecified: Secondary | ICD-10-CM | POA: Diagnosis not present

## 2021-01-05 ENCOUNTER — Ambulatory Visit
Admission: RE | Admit: 2021-01-05 | Discharge: 2021-01-05 | Disposition: A | Discharge status: Home / Self Care | Payer: Medicare Other | Source: Ambulatory Visit | Attending: Radiation Oncology | Admitting: Radiation Oncology

## 2021-01-05 DIAGNOSIS — C678 Malignant neoplasm of overlapping sites of bladder: Secondary | ICD-10-CM | POA: Diagnosis not present

## 2021-01-05 DIAGNOSIS — Z510 Encounter for antineoplastic radiation therapy: Secondary | ICD-10-CM | POA: Diagnosis not present

## 2021-01-05 DIAGNOSIS — C679 Malignant neoplasm of bladder, unspecified: Secondary | ICD-10-CM | POA: Diagnosis not present

## 2021-01-06 ENCOUNTER — Other Ambulatory Visit: Payer: Self-pay | Admitting: Urology

## 2021-01-06 ENCOUNTER — Ambulatory Visit
Admission: RE | Admit: 2021-01-06 | Discharge: 2021-01-06 | Disposition: A | Discharge status: Home / Self Care | Payer: Medicare Other | Source: Ambulatory Visit | Attending: Radiation Oncology | Admitting: Radiation Oncology

## 2021-01-06 ENCOUNTER — Inpatient Hospital Stay: Payer: Medicare Other

## 2021-01-06 ENCOUNTER — Inpatient Hospital Stay (HOSPITAL_BASED_OUTPATIENT_CLINIC_OR_DEPARTMENT_OTHER): Payer: Medicare Other | Admitting: Oncology

## 2021-01-06 ENCOUNTER — Other Ambulatory Visit: Payer: Self-pay

## 2021-01-06 VITALS — BP 115/69 | HR 73 | Temp 97.4°F | Resp 17 | Ht 73.0 in | Wt 317.1 lb

## 2021-01-06 DIAGNOSIS — C670 Malignant neoplasm of trigone of bladder: Secondary | ICD-10-CM | POA: Diagnosis not present

## 2021-01-06 DIAGNOSIS — Z510 Encounter for antineoplastic radiation therapy: Secondary | ICD-10-CM | POA: Diagnosis not present

## 2021-01-06 DIAGNOSIS — C679 Malignant neoplasm of bladder, unspecified: Secondary | ICD-10-CM | POA: Diagnosis not present

## 2021-01-06 DIAGNOSIS — C678 Malignant neoplasm of overlapping sites of bladder: Secondary | ICD-10-CM | POA: Diagnosis not present

## 2021-01-06 LAB — CMP (CANCER CENTER ONLY)
ALT: 9 U/L (ref 0–44)
AST: 11 U/L — ABNORMAL LOW (ref 15–41)
Albumin: 3.5 g/dL (ref 3.5–5.0)
Alkaline Phosphatase: 69 U/L (ref 38–126)
Anion gap: 11 (ref 5–15)
BUN: 38 mg/dL — ABNORMAL HIGH (ref 8–23)
CO2: 24 mmol/L (ref 22–32)
Calcium: 8.6 mg/dL — ABNORMAL LOW (ref 8.9–10.3)
Chloride: 105 mmol/L (ref 98–111)
Creatinine: 1.99 mg/dL — ABNORMAL HIGH (ref 0.61–1.24)
GFR, Estimated: 33 mL/min — ABNORMAL LOW (ref >60)
Glucose, Bld: 108 mg/dL — ABNORMAL HIGH (ref 70–99)
Potassium: 4.7 mmol/L (ref 3.5–5.1)
Sodium: 140 mmol/L (ref 135–145)
Total Bilirubin: 1.1 mg/dL (ref 0.3–1.2)
Total Protein: 6.7 g/dL (ref 6.5–8.1)

## 2021-01-06 LAB — CBC WITH DIFFERENTIAL (CANCER CENTER ONLY)
Abs Immature Granulocytes: 0.01 10*3/uL (ref 0.00–0.07)
Basophils Absolute: 0 10*3/uL (ref 0.0–0.1)
Basophils Relative: 0 %
Eosinophils Absolute: 0.1 10*3/uL (ref 0.0–0.5)
Eosinophils Relative: 2 %
HCT: 35.1 % — ABNORMAL LOW (ref 39.0–52.0)
Hemoglobin: 11.6 g/dL — ABNORMAL LOW (ref 13.0–17.0)
Immature Granulocytes: 0 %
Lymphocytes Relative: 11 %
Lymphs Abs: 0.6 10*3/uL — ABNORMAL LOW (ref 0.7–4.0)
MCH: 30.7 pg (ref 26.0–34.0)
MCHC: 33 g/dL (ref 30.0–36.0)
MCV: 92.9 fL (ref 80.0–100.0)
Monocytes Absolute: 0.4 10*3/uL (ref 0.1–1.0)
Monocytes Relative: 8 %
Neutro Abs: 4.1 10*3/uL (ref 1.7–7.7)
Neutrophils Relative %: 79 %
Platelet Count: 167 10*3/uL (ref 150–400)
RBC: 3.78 MIL/uL — ABNORMAL LOW (ref 4.22–5.81)
RDW: 14.5 % (ref 11.5–15.5)
WBC Count: 5.2 10*3/uL (ref 4.0–10.5)
nRBC: 0 % (ref 0.0–0.2)

## 2021-01-06 MED ORDER — SODIUM CHLORIDE 0.9 % IV SOLN
Freq: Once | INTRAVENOUS | Status: AC
  Filled 2021-01-06: qty 250

## 2021-01-06 MED ORDER — PALONOSETRON HCL INJECTION 0.25 MG/5ML
0.2500 mg | Freq: Once | INTRAVENOUS | Status: AC
  Administered 2021-01-06: 0.25 mg via INTRAVENOUS

## 2021-01-06 MED ORDER — SODIUM CHLORIDE 0.9 % IV SOLN
110.0000 mg | Freq: Once | INTRAVENOUS | Status: AC
  Administered 2021-01-06: 110 mg via INTRAVENOUS
  Filled 2021-01-06: qty 11

## 2021-01-06 MED ORDER — PALONOSETRON HCL INJECTION 0.25 MG/5ML
INTRAVENOUS | Status: AC
  Filled 2021-01-06: qty 5

## 2021-01-06 MED ORDER — SODIUM CHLORIDE 0.9 % IV SOLN
10.0000 mg | Freq: Once | INTRAVENOUS | Status: AC
  Administered 2021-01-06: 10 mg via INTRAVENOUS
  Filled 2021-01-06: qty 10

## 2021-01-06 NOTE — Patient Instructions (Signed)
[facility] Discharge Instructions for Patients Receiving Chemotherapy  Today you received the following chemotherapy agents Carboplatin  To help prevent nausea and vomiting after your treatment, we encourage you to take your nausea medication as directed   If you develop nausea and vomiting that is not controlled by your nausea medication, call the clinic.   BELOW ARE SYMPTOMS THAT SHOULD BE REPORTED IMMEDIATELY:  *FEVER GREATER THAN 100.5 F  *CHILLS WITH OR WITHOUT FEVER  NAUSEA AND VOMITING THAT IS NOT CONTROLLED WITH YOUR NAUSEA MEDICATION  *UNUSUAL SHORTNESS OF BREATH  *UNUSUAL BRUISING OR BLEEDING  TENDERNESS IN MOUTH AND THROAT WITH OR WITHOUT PRESENCE OF ULCERS  *URINARY PROBLEMS  *BOWEL PROBLEMS  UNUSUAL RASH Items with * indicate a potential emergency and should be followed up as soon as possible.  Feel free to call the clinic should you have any questions or concerns. The clinic phone number is [phone].  Please show the CHEMO ALERT CARD at check-in to the Emergency Department and triage nurse.   

## 2021-01-06 NOTE — Progress Notes (Signed)
Hematology and Oncology Follow Up Visit  Patrick Bishop [id number] [birth year] 80 y.o. 01/06/2021 8:28 AM Bishop, Patrick, MDKoirala, [name], MD   Principle Diagnosis: 80 year old man with bladder cancer diagnosed in January 2022.  He was found to have T2N0 disease.   Prior Therapy:  He is status post TURBT completed on October 21, 2020 which showed high-grade urothelial carcinoma with muscle invasion.  Current therapy: Definitive therapy with radiation and weekly carboplatin started on December 24, 2020.  He is here for cycle 3 of therapy.  Interim History: Patrick Bishop returns today for a follow-up visit.  Since the last visit, he started treatment with radiation and weekly carboplatin without any complications.  He denies any nausea vomiting or abdominal pain.  He does report some occasional dysuria and currently on Pyridium.  His performance status is limited but overall stable without any decline.  Denies any worsening GI complaints or diarrhea.     Medications: I have reviewed the patient's current medications.  Current Outpatient Medications  Medication Sig Dispense Refill  . atorvastatin (LIPITOR) 10 MG tablet TAKE 1 TABLET DAILY 90 tablet 3  . HYDROcodone-acetaminophen (NORCO/VICODIN) 5-325 MG tablet Take 1 tablet by mouth 2 (two) times daily as needed for moderate pain. (Patient taking differently: Take 1 tablet by mouth every 6 (six) hours as needed for moderate pain.) 5 tablet 0  . magnesium oxide (MAG-OX) 400 (241.3 Mg) MG tablet Take 1 tablet (400 mg total) by mouth daily.    [name] oxybutynin (DITROPAN) 5 MG tablet Take 1 tablet (5 mg total) by mouth every 8 (eight) hours as needed for bladder spasms. 30 tablet 1  . phenazopyridine (PYRIDIUM) 200 MG tablet Take 1 tablet (200 mg total) by mouth 3 (three) times daily as needed (for pain with urination). 30 tablet 0  . potassium chloride SA (KLOR-CON) 20 MEQ tablet Take 1 tablet (20 mEq total) by mouth 2 (two) times daily. 180 tablet 3   . prochlorperazine (COMPAZINE) 10 MG tablet TAKE 1 TABLET(10 MG) BY MOUTH EVERY 6 HOURS AS NEEDED FOR NAUSEA OR VOMITING 30 tablet 0  . torsemide (DEMADEX) 20 MG tablet Take 40-60 mg by mouth See admin instructions. 60 mg in the morning, 40 mg in the afternoon    . XARELTO 20 MG TABS tablet TAKE 1 TABLET DAILY WITH SUPPER 90 tablet 3   No current facility-administered medications for this visit.     Allergies:  Allergies  Allergen Reactions  . Aldactone [Spironolactone] Other (See Comments)    unknown       Physical Exam: Blood pressure 115/69, pulse 73, temperature (!) 97.4 F (36.3 C), temperature source Tympanic, resp. rate 17, height 6\' 1"  (1.854 m), weight (!) 317 lb 1.6 oz (143.8 kg), SpO2 97 %.   ECOG:  2    General appearance: Comfortable appearing without any discomfort Head: Normocephalic without any trauma Oropharynx: Mucous membranes are moist and pink without any thrush or ulcers. Eyes: Pupils are equal and round reactive to light. Lymph nodes: No cervical, supraclavicular, inguinal or axillary lymphadenopathy.   Heart:regular rate and rhythm.  S1 and S2 without leg edema. Lung: Clear without any rhonchi or wheezes.  No dullness to percussion. Abdomin: Soft, nontender, nondistended with good bowel sounds.  No hepatosplenomegaly. Musculoskeletal: No joint deformity or effusion.  Full range of motion noted. Neurological: No deficits noted on motor, sensory and deep tendon reflex exam. Skin: No petechial rash or dryness.  Appeared moist.      Lab Results: Lab Results  Component Value Date   WBC 5.5 12/31/2020   HGB 11.6 (L) 12/31/2020   HCT 35.1 (L) 12/31/2020   MCV 92.9 12/31/2020   PLT 158 12/31/2020     Chemistry      Component Value Date/Time   NA 142 12/31/2020 1355   NA 140 02/21/2020 1123   K 4.3 12/31/2020 1355   CL 107 12/31/2020 1355   CO2 23 12/31/2020 1355   BUN 34 (H) 12/31/2020 1355   BUN 31 (H) 02/21/2020 1123   CREATININE 1.90  (H) 12/31/2020 1355      Component Value Date/Time   CALCIUM 8.3 (L) 12/31/2020 1355   ALKPHOS 75 12/31/2020 1355   AST 11 (L) 12/31/2020 1355   ALT 10 12/31/2020 1355   BILITOT 1.0 12/31/2020 1355        Impression and Plan:   80 year old man with:  1.    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    His disease status was updated at this time and treatment options were reviewed.  He is currently receiving definitive therapy with radiation and weekly carboplatin.  Complication associated with this therapy include nausea, fatigue, myelosuppression among other complications were reviewed.  At this time I recommended continuing the same dose and schedule.  He is not a candidate for cisplatin based chemotherapy or a cystectomy.  Laboratory data from today personally reviewed and discussed with the patient.  Is hematological parameters including white cells, red cells and platelets continues to be adequate and ready to proceed with treatment.  2.  IV access: No Port-A-Cath needed at this time.  3.  Antiemetics: No nausea or vomiting reported at this time.  Compazine is available to him.  4.  Chronic renal insufficiency: His creatinine clearance remained stable around 35 cc/min and will adjust platinum dosing accordingly.  5.  Hydronephrosis: Urinary stent in place and then will be replaced in the future.  Managed by urology at this time.  6.  Follow-up: We will continue to follow weekly for carboplatin infusion with daily radiation.  MD follow-up in the next few weeks.   30  minutes were spent on this encounter.  The time was dedicated to reviewing laboratory data, disease status update and outlining future plan of care discussion.   Patrick Button, MD 4/13/20228:28 AM

## 2021-01-06 NOTE — Progress Notes (Signed)
Okay to treat with Cr of 1.99 per Dr. Alen Blew.

## 2021-01-07 ENCOUNTER — Encounter (HOSPITAL_COMMUNITY): Payer: Self-pay | Admitting: Urology

## 2021-01-07 ENCOUNTER — Inpatient Hospital Stay (HOSPITAL_COMMUNITY)
Admission: RE | Admit: 2021-01-07 | Discharge: 2021-01-07 | Disposition: A | Discharge status: Home / Self Care | Payer: Medicare Other | Source: Ambulatory Visit

## 2021-01-07 ENCOUNTER — Other Ambulatory Visit: Payer: Self-pay

## 2021-01-07 ENCOUNTER — Ambulatory Visit
Admission: RE | Admit: 2021-01-07 | Discharge: 2021-01-07 | Disposition: A | Discharge status: Home / Self Care | Payer: Medicare Other | Source: Ambulatory Visit | Attending: Radiation Oncology | Admitting: Radiation Oncology

## 2021-01-07 DIAGNOSIS — C679 Malignant neoplasm of bladder, unspecified: Secondary | ICD-10-CM | POA: Diagnosis not present

## 2021-01-07 DIAGNOSIS — Z510 Encounter for antineoplastic radiation therapy: Secondary | ICD-10-CM | POA: Diagnosis not present

## 2021-01-07 DIAGNOSIS — C678 Malignant neoplasm of overlapping sites of bladder: Secondary | ICD-10-CM | POA: Diagnosis not present

## 2021-01-07 NOTE — Progress Notes (Addendum)
Anesthesia Review:  PCP:  DR Dorthy Cooler  Cardiologist : DR Glenetta Hew 12/30/20 LOV with Dr Ellyn Hack.  Oncology- DR Alen Blew  Chest x-ray : 05/21/20- CT of Chest  EKG : 12/30/20  Sleep Study- 02/17/20  PFT-12/18/19  Echo :05/2019 Stress test: Cardiac Cath :  Activity level: can do a flight of stairs without difficulty  Sleep Study/ CPAP :has sleep apnea does not use cpap  Fasting Blood Sugar :      / Checks Blood Sugar -- times a day:   Blood Thinner/ Instructions /Last Dose: ASA / Instructions/ Last Dose :  Xarelto- pt stated he was not to stop prior to surgery.   Currenlty dong daily Radiation treatments.   Covid test will be done day of surgery.   Most recent labs are from 01/06/21.  CBC and CMp in epic.  Next labs of CBC and BMP will be done 01/13/21.   Preoop visit performed as a phone call .  Completed medical history and preop instructions via phone.  PT voiced understanding.   Prediabetes off and on per pt  CMp done 01/06/21 routied to Dr Crisoforo Oxford.r.

## 2021-01-08 ENCOUNTER — Ambulatory Visit
Admission: RE | Admit: 2021-01-08 | Discharge: 2021-01-08 | Disposition: A | Discharge status: Home / Self Care | Payer: Medicare Other | Source: Ambulatory Visit | Attending: Radiation Oncology | Admitting: Radiation Oncology

## 2021-01-08 DIAGNOSIS — C679 Malignant neoplasm of bladder, unspecified: Secondary | ICD-10-CM | POA: Diagnosis not present

## 2021-01-08 DIAGNOSIS — C678 Malignant neoplasm of overlapping sites of bladder: Secondary | ICD-10-CM | POA: Diagnosis not present

## 2021-01-08 DIAGNOSIS — Z510 Encounter for antineoplastic radiation therapy: Secondary | ICD-10-CM | POA: Diagnosis not present

## 2021-01-11 ENCOUNTER — Other Ambulatory Visit: Payer: Self-pay

## 2021-01-11 ENCOUNTER — Ambulatory Visit
Admission: RE | Admit: 2021-01-11 | Discharge: 2021-01-11 | Disposition: A | Discharge status: Home / Self Care | Payer: Medicare Other | Source: Ambulatory Visit | Attending: Radiation Oncology | Admitting: Radiation Oncology

## 2021-01-11 DIAGNOSIS — N1330 Unspecified hydronephrosis: Secondary | ICD-10-CM | POA: Diagnosis not present

## 2021-01-11 DIAGNOSIS — I2510 Atherosclerotic heart disease of native coronary artery without angina pectoris: Secondary | ICD-10-CM | POA: Diagnosis not present

## 2021-01-11 DIAGNOSIS — N400 Enlarged prostate without lower urinary tract symptoms: Secondary | ICD-10-CM | POA: Diagnosis not present

## 2021-01-11 DIAGNOSIS — Z510 Encounter for antineoplastic radiation therapy: Secondary | ICD-10-CM | POA: Diagnosis not present

## 2021-01-11 DIAGNOSIS — C679 Malignant neoplasm of bladder, unspecified: Secondary | ICD-10-CM | POA: Diagnosis not present

## 2021-01-11 DIAGNOSIS — C670 Malignant neoplasm of trigone of bladder: Secondary | ICD-10-CM | POA: Diagnosis not present

## 2021-01-11 DIAGNOSIS — I714 Abdominal aortic aneurysm, without rupture: Secondary | ICD-10-CM | POA: Diagnosis not present

## 2021-01-11 DIAGNOSIS — C678 Malignant neoplasm of overlapping sites of bladder: Secondary | ICD-10-CM | POA: Diagnosis not present

## 2021-01-11 DIAGNOSIS — I517 Cardiomegaly: Secondary | ICD-10-CM | POA: Diagnosis not present

## 2021-01-11 DIAGNOSIS — I700 Atherosclerosis of aorta: Secondary | ICD-10-CM | POA: Diagnosis not present

## 2021-01-12 ENCOUNTER — Other Ambulatory Visit: Payer: Self-pay

## 2021-01-12 ENCOUNTER — Ambulatory Visit
Admission: RE | Admit: 2021-01-12 | Discharge: 2021-01-12 | Disposition: A | Discharge status: Home / Self Care | Payer: Medicare Other | Source: Ambulatory Visit | Attending: Radiation Oncology | Admitting: Radiation Oncology

## 2021-01-12 DIAGNOSIS — C679 Malignant neoplasm of bladder, unspecified: Secondary | ICD-10-CM | POA: Diagnosis not present

## 2021-01-12 DIAGNOSIS — C678 Malignant neoplasm of overlapping sites of bladder: Secondary | ICD-10-CM | POA: Diagnosis not present

## 2021-01-12 DIAGNOSIS — Z510 Encounter for antineoplastic radiation therapy: Secondary | ICD-10-CM | POA: Diagnosis not present

## 2021-01-13 ENCOUNTER — Inpatient Hospital Stay: Payer: Medicare Other

## 2021-01-13 ENCOUNTER — Ambulatory Visit
Admission: RE | Admit: 2021-01-13 | Discharge: 2021-01-13 | Disposition: A | Discharge status: Home / Self Care | Payer: Medicare Other | Source: Ambulatory Visit | Attending: Radiation Oncology | Admitting: Radiation Oncology

## 2021-01-13 ENCOUNTER — Other Ambulatory Visit: Payer: Self-pay

## 2021-01-13 VITALS — BP 112/56 | HR 68 | Temp 97.5°F | Resp 18

## 2021-01-13 DIAGNOSIS — C670 Malignant neoplasm of trigone of bladder: Secondary | ICD-10-CM

## 2021-01-13 DIAGNOSIS — C678 Malignant neoplasm of overlapping sites of bladder: Secondary | ICD-10-CM | POA: Diagnosis not present

## 2021-01-13 DIAGNOSIS — C679 Malignant neoplasm of bladder, unspecified: Secondary | ICD-10-CM

## 2021-01-13 DIAGNOSIS — Z510 Encounter for antineoplastic radiation therapy: Secondary | ICD-10-CM | POA: Diagnosis not present

## 2021-01-13 LAB — CBC WITH DIFFERENTIAL (CANCER CENTER ONLY)
Abs Immature Granulocytes: 0.01 10*3/uL (ref 0.00–0.07)
Basophils Absolute: 0 10*3/uL (ref 0.0–0.1)
Basophils Relative: 1 %
Eosinophils Absolute: 0.1 10*3/uL (ref 0.0–0.5)
Eosinophils Relative: 2 %
HCT: 36 % — ABNORMAL LOW (ref 39.0–52.0)
Hemoglobin: 11.8 g/dL — ABNORMAL LOW (ref 13.0–17.0)
Immature Granulocytes: 0 %
Lymphocytes Relative: 9 %
Lymphs Abs: 0.4 10*3/uL — ABNORMAL LOW (ref 0.7–4.0)
MCH: 30.6 pg (ref 26.0–34.0)
MCHC: 32.8 g/dL (ref 30.0–36.0)
MCV: 93.5 fL (ref 80.0–100.0)
Monocytes Absolute: 0.4 10*3/uL (ref 0.1–1.0)
Monocytes Relative: 10 %
Neutro Abs: 3.3 10*3/uL (ref 1.7–7.7)
Neutrophils Relative %: 78 %
Platelet Count: 129 10*3/uL — ABNORMAL LOW (ref 150–400)
RBC: 3.85 MIL/uL — ABNORMAL LOW (ref 4.22–5.81)
RDW: 14.9 % (ref 11.5–15.5)
WBC Count: 4.2 10*3/uL (ref 4.0–10.5)
nRBC: 0 % (ref 0.0–0.2)

## 2021-01-13 LAB — CMP (CANCER CENTER ONLY)
ALT: 9 U/L (ref 0–44)
AST: 12 U/L — ABNORMAL LOW (ref 15–41)
Albumin: 3.6 g/dL (ref 3.5–5.0)
Alkaline Phosphatase: 71 U/L (ref 38–126)
Anion gap: 10 (ref 5–15)
BUN: 34 mg/dL — ABNORMAL HIGH (ref 8–23)
CO2: 26 mmol/L (ref 22–32)
Calcium: 8.5 mg/dL — ABNORMAL LOW (ref 8.9–10.3)
Chloride: 107 mmol/L (ref 98–111)
Creatinine: 2.05 mg/dL — ABNORMAL HIGH (ref 0.61–1.24)
GFR, Estimated: 32 mL/min — ABNORMAL LOW (ref >60)
Glucose, Bld: 96 mg/dL (ref 70–99)
Potassium: 4.5 mmol/L (ref 3.5–5.1)
Sodium: 143 mmol/L (ref 135–145)
Total Bilirubin: 1.1 mg/dL (ref 0.3–1.2)
Total Protein: 6.7 g/dL (ref 6.5–8.1)

## 2021-01-13 MED ORDER — PALONOSETRON HCL INJECTION 0.25 MG/5ML
INTRAVENOUS | Status: AC
  Filled 2021-01-13: qty 5

## 2021-01-13 MED ORDER — SODIUM CHLORIDE 0.9 % IV SOLN
10.0000 mg | Freq: Once | INTRAVENOUS | Status: AC
  Administered 2021-01-13: 10 mg via INTRAVENOUS
  Filled 2021-01-13: qty 10

## 2021-01-13 MED ORDER — SODIUM CHLORIDE 0.9 % IV SOLN
110.0000 mg | Freq: Once | INTRAVENOUS | Status: AC
  Administered 2021-01-13: 110 mg via INTRAVENOUS
  Filled 2021-01-13: qty 11

## 2021-01-13 MED ORDER — SODIUM CHLORIDE 0.9 % IV SOLN
Freq: Once | INTRAVENOUS | Status: AC
  Filled 2021-01-13: qty 250

## 2021-01-13 MED ORDER — PALONOSETRON HCL INJECTION 0.25 MG/5ML
0.2500 mg | Freq: Once | INTRAVENOUS | Status: AC
  Administered 2021-01-13: 0.25 mg via INTRAVENOUS

## 2021-01-13 NOTE — Patient Instructions (Signed)
[facility] Discharge Instructions for Patients Receiving Chemotherapy  Today you received the following chemotherapy agents Carboplatin  To help prevent nausea and vomiting after your treatment, we encourage you to take your nausea medication as directed   If you develop nausea and vomiting that is not controlled by your nausea medication, call the clinic.   BELOW ARE SYMPTOMS THAT SHOULD BE REPORTED IMMEDIATELY:  *FEVER GREATER THAN 100.5 F  *CHILLS WITH OR WITHOUT FEVER  NAUSEA AND VOMITING THAT IS NOT CONTROLLED WITH YOUR NAUSEA MEDICATION  *UNUSUAL SHORTNESS OF BREATH  *UNUSUAL BRUISING OR BLEEDING  TENDERNESS IN MOUTH AND THROAT WITH OR WITHOUT PRESENCE OF ULCERS  *URINARY PROBLEMS  *BOWEL PROBLEMS  UNUSUAL RASH Items with * indicate a potential emergency and should be followed up as soon as possible.  Feel free to call the clinic should you have any questions or concerns. The clinic phone number is [phone].  Please show the CHEMO ALERT CARD at check-in to the Emergency Department and triage nurse.   

## 2021-01-13 NOTE — Progress Notes (Signed)
Per MD okay to treat with crt 2.05

## 2021-01-14 ENCOUNTER — Ambulatory Visit
Admission: RE | Admit: 2021-01-14 | Discharge: 2021-01-14 | Disposition: A | Discharge status: Home / Self Care | Payer: Medicare Other | Source: Ambulatory Visit | Attending: Radiation Oncology | Admitting: Radiation Oncology

## 2021-01-14 ENCOUNTER — Encounter (HOSPITAL_COMMUNITY): Payer: Self-pay | Admitting: Urology

## 2021-01-14 DIAGNOSIS — Z510 Encounter for antineoplastic radiation therapy: Secondary | ICD-10-CM | POA: Diagnosis not present

## 2021-01-14 DIAGNOSIS — C679 Malignant neoplasm of bladder, unspecified: Secondary | ICD-10-CM | POA: Diagnosis not present

## 2021-01-14 DIAGNOSIS — C678 Malignant neoplasm of overlapping sites of bladder: Secondary | ICD-10-CM | POA: Diagnosis not present

## 2021-01-14 NOTE — Anesthesia Preprocedure Evaluation (Addendum)
Anesthesia Evaluation  Patient identified by MRN, date of birth, ID band Patient awake    Reviewed: Allergy & Precautions, NPO status , Patient's Chart, lab work & pertinent test results, reviewed documented beta blocker date and time   Airway Mallampati: II  TM Distance: >3 FB Neck ROM: Full    Dental  (+) Edentulous Upper, Edentulous Lower   Pulmonary shortness of breath, sleep apnea , pneumonia, former smoker,    breath sounds clear to auscultation + decreased breath sounds      Cardiovascular hypertension, Pt. on medications and Pt. on home beta blockers Normal cardiovascular exam Rhythm:Regular Rate:Normal  Echo 05/2019 1. Left ventricular ejection fraction, by visual estimation, is 60 to 65%. The left ventricle has normal function. Normal left ventricular size. There is mildly increased left ventricular hypertrophy.  2. Definity contrast agent was given IV to delineate the left ventricular endocardial borders.  3. Global right ventricle has severely reduced systolic function.The right ventricular size is mildly enlarged.  4. Left atrial size was normal.  5. Right atrial size was mildly dilated.  6. The mitral valve is normal in structure. Trace mitral valve regurgitation. No evidence of mitral stenosis.  7. The tricuspid valve is normal in structure. Tricuspid valve regurgitation is trivial.  8. The aortic valve is tricuspid Aortic valve regurgitation was not visualized by color flow Doppler. Structurally normal aortic valve, with  no evidence of sclerosis or stenosis.  9. The pulmonic valve was not well visualized. Pulmonic valve regurgitation is not visualized by color flow Doppler.  10. Mildly elevated pulmonary artery systolic pressure.  11. The inferior vena cava is dilated in size with <50% respiratory variability, suggesting right atrial pressure of 15 mmHg.  12. Definity used; normal LV function; mild LVH; right  heart not well visualized but right atrium and ventricle appear to be dilated with significant RV dysfunction.    Neuro/Psych negative neurological ROS     GI/Hepatic negative GI ROS, Neg liver ROS,   Endo/Other  Morbid obesity  Renal/GU Renal disease     Musculoskeletal  (+) Arthritis ,   Abdominal (+) + obese,   Peds  Hematology negative hematology ROS (+)   Anesthesia Other Findings   Reproductive/Obstetrics                                                            Anesthesia Evaluation  Patient identified by MRN, date of birth, ID band Patient awake    Reviewed: Allergy & Precautions, NPO status , Patient's Chart, lab work & pertinent test results, reviewed documented beta blocker date and time   History of Anesthesia Complications Negative for: history of anesthetic complications  Airway Mallampati: II  TM Distance: >3 FB Neck ROM: Full    Dental  (+) Edentulous Upper, Edentulous Lower   Pulmonary sleep apnea and Continuous Positive Airway Pressure Ventilation , former smoker,    Pulmonary exam normal        Cardiovascular hypertension, Pt. on medications and Pt. on home beta blockers + Peripheral Vascular Disease and +CHF  + dysrhythmias Atrial Fibrillation  Rhythm:Irregular Rate:Normal     Neuro/Psych negative neurological ROS  negative psych ROS   GI/Hepatic negative GI ROS, Neg liver ROS,   Endo/Other  Morbid obesity Pre-DM   Renal/GU CRFRenal disease  Musculoskeletal  (+) Arthritis ,   Abdominal   Peds  Hematology  On xarelto    Anesthesia Other Findings Hx Covid+ Pneumonia 07/2019 Covid test negative    Reproductive/Obstetrics                           Anesthesia Physical Anesthesia Plan  ASA: III  Anesthesia Plan: General   Post-op Pain Management:    Induction: Intravenous  PONV Risk Score and Plan: 2 and Treatment may vary due to age or medical  condition and Ondansetron  Airway Management Planned: LMA  Additional Equipment: None  Intra-op Plan:   Post-operative Plan: Extubation in OR  Informed Consent: I have reviewed the patients History and Physical, chart, labs and discussed the procedure including the risks, benefits and alternatives for the proposed anesthesia with the patient or authorized representative who has indicated his/her understanding and acceptance.     Dental advisory given  Plan Discussed with: CRNA and Anesthesiologist  Anesthesia Plan Comments:       Anesthesia Quick Evaluation  Anesthesia Physical Anesthesia Plan  ASA: III  Anesthesia Plan: General   Post-op Pain Management:    Induction: Intravenous  PONV Risk Score and Plan: 3 and Ondansetron, Dexamethasone and Treatment may vary due to age or medical condition  Airway Management Planned: LMA  Additional Equipment: None  Intra-op Plan:   Post-operative Plan: Extubation in OR  Informed Consent: I have reviewed the patients History and Physical, chart, labs and discussed the procedure including the risks, benefits and alternatives for the proposed anesthesia with the patient or authorized representative who has indicated his/her understanding and acceptance.     Dental advisory given  Plan Discussed with: CRNA  Anesthesia Plan Comments: (See APP note by Durel Salts, FNP )      Anesthesia Quick Evaluation

## 2021-01-14 NOTE — Progress Notes (Signed)
Anesthesia Chart Review:   Pt is a same day work up   Case: [id number] Date/Time: 01/19/21 1400   Procedure: CYSTOSCOPY WITH STENT EXCHANGE (Left ) - ONLY NEEDS 30 MIN   Anesthesia type: General   Pre-op diagnosis: LEFT URETERAL OBSTRUCTION   [facility] / [facility]   Surgeons: Ceasar Mons, MD      DISCUSSION: - Pt is 80 years old with hx chronic diastolic HF, persistent afib, pulmonary hypertension, HTN, chronic venous insufficiency (s/p B iliac and femoral stents), CKD (stage 3), pre-diabetes, OSA, AAA (3.4cm by 08/13/20 [place]), bladder cancer (dx 09/2020 - undergoing chemo and radiation)  Pt to remain on xarelto perioperatively.    PROVIDERS: - PCP is Lujean Amel, MD - Oncologist is Zola Button, MD. Last office visit 01/06/21 - Cardiologist is Glenetta Hew, MD. Last office visit 12/30/20   LABS:  - CBC with diff 01/13/21: H/H 11.8/36.0, platelets 129.  - CMP 01/13/21: Cr 2.05, BUN 34, Ca 8.5, AST 12. Renal function is consistent with recent prior labs   IMAGES: Aorta US 08/13/20:  - Distal abdominal aortic aneurysm measuring 3.4 cm. Recommend follow-up ultrasound every 3 years.   Renal US 08/12/20 (done for AKI): - Severe left hydronephrosis.  Cortical thinning on the left.  CT chest hi res 05/21/20:  1. No evidence of fibrotic interstitial lung disease. Redemonstrated elevation of the right hemidiaphragm with associated atelectasis or scarring and minimal, bland appearing scarring of the bilateral lung bases. No significant air trapping on expiratory phase imaging.  2. Stable, definitively small nodules of the lateral segment right middle lobe and right middle lobe. 3. Coronary artery disease.  Aortic Atherosclerosis  4. Redemonstrated large, partially imaged fat containing mass of the left axilla, likely a lipoma.   EKG 12/30/20: afib with slow ventricular response   CV: Echo 06/25/19:  1. Left ventricular ejection fraction, by visual estimation, is 60  to 65%. The left ventricle has normal function. Normal left ventricular size. There is mildly increased left ventricular hypertrophy.  2. Definity contrast agent was given IV to delineate the left ventricular  endocardial borders.  3. Global right ventricle has severely reduced systolic function.The right ventricular size is mildly enlarged.  4. Left atrial size was normal.  5. Right atrial size was mildly dilated.  6. The mitral valve is normal in structure. Trace mitral valve regurgitation. No evidence of mitral stenosis.  7. The tricuspid valve is normal in structure. Tricuspid valve regurgitation is trivial.  8. The aortic valve is tricuspid Aortic valve regurgitation was not visualized by color flow Doppler. Structurally normal aortic valve, with no evidence of sclerosis or stenosis.  9. The pulmonic valve was not well visualized. Pulmonic valve regurgitation is not visualized by color flow Doppler.  10. Mildly elevated pulmonary artery systolic pressure.  11. The inferior vena cava is dilated in size with <50% respiratory variability, suggesting right atrial pressure of 15 mmHg.  12. Definity used; normal LV function; mild LVH; right heart not well visualized but right atrium and ventricle appear to be dilated with significant RV dysfunction.    Lower extremity venous reflux [place] study 12/29/17:  - Right: Abnormal reflux times were noted in the common femoral vein, great  saphenous vein at the groin, and small saphenous vein at the level of the sapheno-popliteal junction. There is no evidence of obvious deep vein thrombosis in the lower extremity.  - Left: Abnormal reflux times were noted in the common femoral vein, femoral vein  in the thigh, great saphenous vein at the groin, and great saphenous vein at the proximal calf. There is no evidence of obvious deep vein thrombosis in the lower extremity.  - Suboptimal exam due to body habitus.   Past Medical History:  Diagnosis Date  .  AAA (abdominal aortic aneurysm) ([place])    3.4cm by [place] 07/2020  . Arthritis   . Cancer ([place])   . CHF (congestive heart failure) ([place])   . Chronic diastolic heart failure (H[facility])    Normal LV Fxn by Echo 04/2017   . Chronic venous insufficiency 2015   s/p Bilateral Iliac & Femoral Stents (including IVC) - DEEP Venous Reflux.  Also s/p Bilateral GSV Ablation.  [name] Dyspnea   . Dysrhythmia    a fib   . Essential hypertension   . History of kidney stones   . Hx of adenomatous colonic polyps   . Hyperlipidemia with target LDL less than 100   . Longstanding persistent atrial fibrillation ([place])   . Nephrolithiasis 05/2019   CKD stage 3  . Obesity   . OSA on CPAP    does not cpap   . Peripheral vascular disease (H[facility])    venous insufficiency in legs  . Pneumonia 08/2019  . Pre-diabetes   . Pulmonary hypertension ([place])   . Pyelonephritis 05/2019  . Venous insufficiency    LE    Past Surgical History:  Procedure Laterality Date  . ABDOMINAL VENOUS DUPLEX Bilateral 07/2014   ([place]: Dr. Cleda Mccreedy): (prior to Bilateral Iliac-Femoral Venous Stent Placement): Patent IVC.  No DVT B/L Iliac or Com fem V..  +++ DEEP V REFLUX B/L ILIAC & FEM V.  Absent Superficial V Reflux B/L x L SSV (too tortuous for intervention).  BL GSV occluded - prior ablation.   . ABDOMINAL VENOUS DUPLEX Bilateral 11/2014   Absent DVT.  Left common femoral venous reflux: 1508 MS left common femoral vein, 2284MS left proximal femoral vein -> cystic structure noted in the left popliteal vein (7.6 x 1.4 x 3.8 cm).;  Right leg deep venous reflux noted in the distal iliac, common femoral, proximal profunda femoral, proximal superficial femoral, superficial femoral and popliteal vein.  [name] CARDIAC CATHETERIZATION  10/2009   [facility] - Dr. 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  EF~45%.  Mild pulmonary pretension.  [name] CARDIAC PET  08/2009   Inferolateral  partially reversible defect -- > false positive by cath  . COLONOSCOPY    . CYSTOSCOPY/URETEROSCOPY/HOLMIUM LASER/STENT PLACEMENT Left 11/06/2019   Procedure: CYSTOSCOPY/RETROGRADE/URETEROSCOPY/HOLMIUM LASER/STENT PLACEMENT. REMOVAL NEPHROSCOPYTUBE;  Surgeon: Ceasar Mons, MD;  Location: [facility];  Service: Urology;  Laterality: Left;  . ESOPHAGOGASTRODUODENOSCOPY    . GREATER SAPHENOUS VEIN ABLATION Bilateral Before 2015   [facility] (Dr. Eugenie Filler)  . IR NEPHROSTOMY EXCHANGE LEFT  08/28/2019  . IR NEPHROSTOMY PLACEMENT LEFT  06/24/2019  . IR TRANSCATH PLC STENT  EA ADD VEIN  INC ANGIOPLASTY Left 10/2014   [facility], Dr. 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 IR TRANSCATH PLC STENT  INITIAL VEIN  INC ANGIOPLASTY Right 08/2014   [facility] - Dr. Stormy Fabian): US Guided --> R Common & External Iliac, Common Femoral Venography with Placement of  24 mmx 70 mm Stent - R Com Iliac-Ext Iliac- & Com Fem V. reducing 60-80% venous compression to ~0%).;; also noted significant L-sided venous compression (staged stenting).   [name] LIPOMA EXCISION     x 2 came back again  . NM MYOVIEW LTD  11/2005   Adenosine Myoview [facility] - Dr. Cleda Mccreedy): Normal myocardial perfusion scan. No ischemia or infarction.  . TRANSTHORACIC ECHOCARDIOGRAM  04/26/2017    [facility] - Dr. Cleda Mccreedy) - report not available.  Per clinic note: Normal global LV function. Mild concentric temperature. Mild TR. Mild pulmonary hypertension.;;   . TRANSTHORACIC ECHOCARDIOGRAM  7/'15; 7/'17   [facility] - Dr. Saverio Danker) a) low normal LV function (EF 56%). Moderate concentric LVH. Mild LA dilation. Normal pulmonary pressures.  ;; b) normal global function - EF 60-65%. Moderate pulmonary  hypertension. Biatrial enlargement. Moderate concentric LVH.  [name] TRANSTHORACIC ECHOCARDIOGRAM  02/2010   [facility] - Dr. Saverio Danker): Normal global LV function. Mild LVH. EF 60%.; November 2010. Moderate LV dilation. Moderate LVH. Normal EF 50-55%. Mild to moderate pulmonary hypertension. (July 2012 showed normal function with mild LVH, mild MR noted in August 2014)  . TRANSTHORACIC ECHOCARDIOGRAM  05/2019   During admission for UTI, sepsis with A. fib RVR: LV normal size and function.  EF 60-65%.  Mildly reduced RV size with severely reduced function.  Despite this normal atrial size noted with just only mild right atrial enlargement with elevated RAP (dilated IVC).  Relatively poor TR jet, but findings do suggest elevated RV pressures.  . TRANSURETHRAL RESECTION OF BLADDER TUMOR Left 10/21/2020   Procedure: TRANSURETHRAL RESECTION OF BLADDER TUMOR (TURBT)/ RETROGRADE/  URETERAL STENT PLACEMENT;  Surgeon: Ceasar Mons, MD;  Location[facility];  Service: Urology;  Laterality: Left;    MEDICATIONS: No current facility-administered medications for this encounter.   [name] atorvastatin (LIPITOR) 10 MG tablet  . HYDROcodone-acetaminophen (NORCO/VICODIN) 5-325 MG tablet  . loperamide (IMODIUM A-D) 2 MG tablet  . magnesium oxide (MAG-OX) 400 (241.3 Mg) MG tablet  . metoprolol tartrate (LOPRESSOR) 25 MG tablet  . potassium chloride SA (KLOR-CON) 20 MEQ tablet  . prochlorperazine (COMPAZINE) 10 MG tablet  . tamsulosin (FLOMAX) 0.4 MG CAPS capsule  . torsemide (DEMADEX) 20 MG tablet  . XARELTO 20 MG TABS tablet  . ciprofloxacin (CIPRO) 500 MG tablet  . oxybutynin (DITROPAN) 5 MG tablet  . phenazopyridine (PYRIDIUM) 200 MG tablet    Pt to remain on xarelto perioperatively.    If no changes, I anticipate pt can proceed with surgery as scheduled.   Willeen Cass, PhD, FNP-BC [facility]/Anesthesiology Phone: [phone] 01/14/2021 10:41 AM

## 2021-01-15 ENCOUNTER — Other Ambulatory Visit: Payer: Self-pay

## 2021-01-15 ENCOUNTER — Ambulatory Visit
Admission: RE | Admit: 2021-01-15 | Discharge: 2021-01-15 | Disposition: A | Discharge status: Home / Self Care | Payer: Medicare Other | Source: Ambulatory Visit | Attending: Radiation Oncology | Admitting: Radiation Oncology

## 2021-01-15 DIAGNOSIS — Z510 Encounter for antineoplastic radiation therapy: Secondary | ICD-10-CM | POA: Diagnosis not present

## 2021-01-15 DIAGNOSIS — C678 Malignant neoplasm of overlapping sites of bladder: Secondary | ICD-10-CM | POA: Diagnosis not present

## 2021-01-15 DIAGNOSIS — C679 Malignant neoplasm of bladder, unspecified: Secondary | ICD-10-CM | POA: Diagnosis not present

## 2021-01-15 NOTE — Progress Notes (Addendum)
Spoke with daughter Minus Liberty and reviewed pre op instructions" npo after midnight food, clear liquids from midnight until 1115 am then npo, meds to take: atorvastatin, tamsulosin, metorprolol tartrate, pt per dr winter is to stay on xarelto prior to surgery, arrive 1115 am 01-19-2021 [facility] entrance admiiting, pt daughter verbalized understanding all pre op instructions.

## 2021-01-17 ENCOUNTER — Encounter: Payer: Self-pay | Admitting: Cardiology

## 2021-01-17 NOTE — Assessment & Plan Note (Signed)
Chronic anemia, stable.  Discussed use of Ace wraps, foot elevation and possibly support stockings.  Wonder if he would benefit from pneumatic sequential compression devices.  Diuretics have been managed by nephrology.

## 2021-01-17 NOTE — Assessment & Plan Note (Signed)
Stable orthopnea more related to obesity.  Needs to use CPAP.  Most exacerbated if he is in A. fib RVR which has not occurred in a long time.  Diuretics per nephrology.

## 2021-01-17 NOTE — Assessment & Plan Note (Signed)
Combination of obesity, OSA/OHS with some HFpEF complicated by A. Fib.  No longer on amlodipine (not listed).   Sleeps in recliner for comfort.  Needs to use CPAP.  Defer to pulmonary medicine.

## 2021-01-17 NOTE — Assessment & Plan Note (Signed)
I think he still is on metoprolol given his heart rate 55 bpm.  Is not listed, but I do not see where this was discontinued.  Continue current dosing at 25 mg morning and 37.5 mg p.m. Is on Xarelto for anticoagulation.  No bleeding issues.  Okay to hold Xarelto for any surgery. -> Standard will be to hold 2 days preop for most of his procedures or surgeries, 3 days for more high risk (bleeding) procedures

## 2021-01-17 NOTE — Assessment & Plan Note (Signed)
Difficult for him to exercise.  Discussed importance of dietary modification.  In addition to avoiding excess salt in his diet, should also simply cut down calorie intake.

## 2021-01-17 NOTE — Assessment & Plan Note (Signed)
Using COVID since his pneumonia back in November 2020.  Needs to be reevaluated by pulmonary medicine to adjust settings.

## 2021-01-17 NOTE — Assessment & Plan Note (Signed)
Has had some hypotension issues in the past we will see the amlodipine dose was discontinued.  Need to confirm if he is on beta-blocker or not.  Not listed on his current list.  But heart rate would indicate that he is taking beta-blocker.

## 2021-01-18 ENCOUNTER — Ambulatory Visit
Admission: RE | Admit: 2021-01-18 | Discharge: 2021-01-18 | Disposition: A | Discharge status: Home / Self Care | Payer: Medicare Other | Source: Ambulatory Visit | Attending: Radiation Oncology | Admitting: Radiation Oncology

## 2021-01-18 DIAGNOSIS — Z510 Encounter for antineoplastic radiation therapy: Secondary | ICD-10-CM | POA: Diagnosis not present

## 2021-01-18 DIAGNOSIS — C678 Malignant neoplasm of overlapping sites of bladder: Secondary | ICD-10-CM | POA: Diagnosis not present

## 2021-01-18 DIAGNOSIS — C679 Malignant neoplasm of bladder, unspecified: Secondary | ICD-10-CM | POA: Diagnosis not present

## 2021-01-19 ENCOUNTER — Ambulatory Visit (HOSPITAL_COMMUNITY): Payer: Medicare Other | Admitting: Emergency Medicine

## 2021-01-19 ENCOUNTER — Ambulatory Visit: Payer: Medicare Other

## 2021-01-19 ENCOUNTER — Encounter (HOSPITAL_COMMUNITY): Payer: Self-pay | Admitting: Urology

## 2021-01-19 ENCOUNTER — Ambulatory Visit (HOSPITAL_COMMUNITY)
Admission: RE | Admit: 2021-01-19 | Discharge: 2021-01-19 | Disposition: A | Discharge status: Home / Self Care | Payer: Medicare Other | Attending: Urology | Admitting: Urology

## 2021-01-19 ENCOUNTER — Ambulatory Visit (HOSPITAL_COMMUNITY): Payer: Medicare Other

## 2021-01-19 ENCOUNTER — Encounter (HOSPITAL_COMMUNITY)
Admission: RE | Disposition: A | Discharge status: Home / Self Care | Payer: Self-pay | Source: Home / Self Care | Attending: Urology

## 2021-01-19 DIAGNOSIS — I890 Lymphedema, not elsewhere classified: Secondary | ICD-10-CM | POA: Insufficient documentation

## 2021-01-19 DIAGNOSIS — N135 Crossing vessel and stricture of ureter without hydronephrosis: Secondary | ICD-10-CM | POA: Diagnosis not present

## 2021-01-19 DIAGNOSIS — I4821 Permanent atrial fibrillation: Secondary | ICD-10-CM | POA: Diagnosis not present

## 2021-01-19 DIAGNOSIS — I4891 Unspecified atrial fibrillation: Secondary | ICD-10-CM | POA: Diagnosis not present

## 2021-01-19 DIAGNOSIS — Z809 Family history of malignant neoplasm, unspecified: Secondary | ICD-10-CM | POA: Diagnosis not present

## 2021-01-19 DIAGNOSIS — I5032 Chronic diastolic (congestive) heart failure: Secondary | ICD-10-CM | POA: Diagnosis not present

## 2021-01-19 DIAGNOSIS — Z87442 Personal history of urinary calculi: Secondary | ICD-10-CM | POA: Diagnosis not present

## 2021-01-19 DIAGNOSIS — E785 Hyperlipidemia, unspecified: Secondary | ICD-10-CM | POA: Insufficient documentation

## 2021-01-19 DIAGNOSIS — I272 Pulmonary hypertension, unspecified: Secondary | ICD-10-CM | POA: Insufficient documentation

## 2021-01-19 DIAGNOSIS — R7303 Prediabetes: Secondary | ICD-10-CM | POA: Diagnosis not present

## 2021-01-19 DIAGNOSIS — Z7901 Long term (current) use of anticoagulants: Secondary | ICD-10-CM | POA: Insufficient documentation

## 2021-01-19 DIAGNOSIS — G4733 Obstructive sleep apnea (adult) (pediatric): Secondary | ICD-10-CM | POA: Diagnosis not present

## 2021-01-19 DIAGNOSIS — Z20822 Contact with and (suspected) exposure to covid-19: Secondary | ICD-10-CM | POA: Diagnosis not present

## 2021-01-19 DIAGNOSIS — I739 Peripheral vascular disease, unspecified: Secondary | ICD-10-CM | POA: Diagnosis not present

## 2021-01-19 DIAGNOSIS — Z923 Personal history of irradiation: Secondary | ICD-10-CM | POA: Diagnosis not present

## 2021-01-19 DIAGNOSIS — C679 Malignant neoplasm of bladder, unspecified: Secondary | ICD-10-CM | POA: Diagnosis not present

## 2021-01-19 DIAGNOSIS — C670 Malignant neoplasm of trigone of bladder: Secondary | ICD-10-CM | POA: Diagnosis not present

## 2021-01-19 DIAGNOSIS — Z79899 Other long term (current) drug therapy: Secondary | ICD-10-CM | POA: Diagnosis not present

## 2021-01-19 DIAGNOSIS — Z87891 Personal history of nicotine dependence: Secondary | ICD-10-CM | POA: Diagnosis not present

## 2021-01-19 DIAGNOSIS — I714 Abdominal aortic aneurysm, without rupture: Secondary | ICD-10-CM | POA: Insufficient documentation

## 2021-01-19 DIAGNOSIS — Z466 Encounter for fitting and adjustment of urinary device: Secondary | ICD-10-CM | POA: Diagnosis not present

## 2021-01-19 DIAGNOSIS — C676 Malignant neoplasm of ureteric orifice: Secondary | ICD-10-CM | POA: Diagnosis not present

## 2021-01-19 DIAGNOSIS — N183 Chronic kidney disease, stage 3 unspecified: Secondary | ICD-10-CM | POA: Diagnosis not present

## 2021-01-19 DIAGNOSIS — Z8249 Family history of ischemic heart disease and other diseases of the circulatory system: Secondary | ICD-10-CM | POA: Insufficient documentation

## 2021-01-19 DIAGNOSIS — I130 Hypertensive heart and chronic kidney disease with heart failure and stage 1 through stage 4 chronic kidney disease, or unspecified chronic kidney disease: Secondary | ICD-10-CM | POA: Diagnosis not present

## 2021-01-19 DIAGNOSIS — Z6841 Body Mass Index (BMI) 40.0 and over, adult: Secondary | ICD-10-CM | POA: Insufficient documentation

## 2021-01-19 DIAGNOSIS — Z9221 Personal history of antineoplastic chemotherapy: Secondary | ICD-10-CM | POA: Insufficient documentation

## 2021-01-19 DIAGNOSIS — Z95820 Peripheral vascular angioplasty status with implants and grafts: Secondary | ICD-10-CM | POA: Insufficient documentation

## 2021-01-19 HISTORY — DX: Malignant (primary) neoplasm, unspecified: C80.1

## 2021-01-19 HISTORY — PX: CYSTOSCOPY W/ URETERAL STENT PLACEMENT: SHX1429

## 2021-01-19 HISTORY — DX: Abdominal aortic aneurysm, without rupture: I71.4

## 2021-01-19 HISTORY — DX: Abdominal aortic aneurysm, without rupture, unspecified: I71.40

## 2021-01-19 LAB — SARS CORONAVIRUS 2 BY RT PCR (HOSPITAL ORDER, PERFORMED IN ~~LOC~~ HOSPITAL LAB): SARS Coronavirus 2: NEGATIVE

## 2021-01-19 SURGERY — CYSTOSCOPY, FLEXIBLE, WITH STENT REPLACEMENT
Anesthesia: General | Laterality: Left

## 2021-01-19 MED ORDER — PROPOFOL 10 MG/ML IV BOLUS
INTRAVENOUS | Status: AC
  Filled 2021-01-19: qty 20

## 2021-01-19 MED ORDER — ONDANSETRON HCL 4 MG/2ML IJ SOLN
INTRAMUSCULAR | Status: AC
  Filled 2021-01-19: qty 2

## 2021-01-19 MED ORDER — CHLORHEXIDINE GLUCONATE 0.12 % MT SOLN
15.0000 mL | Freq: Once | OROMUCOSAL | Status: AC
  Administered 2021-01-19: 15 mL via OROMUCOSAL

## 2021-01-19 MED ORDER — PROPOFOL 10 MG/ML IV BOLUS
INTRAVENOUS | Status: DC | PRN
  Administered 2021-01-19: 200 mg via INTRAVENOUS

## 2021-01-19 MED ORDER — LIDOCAINE 2% (20 MG/ML) 5 ML SYRINGE
INTRAMUSCULAR | Status: DC | PRN
  Administered 2021-01-19: 100 mg via INTRAVENOUS

## 2021-01-19 MED ORDER — DEXAMETHASONE SODIUM PHOSPHATE 10 MG/ML IJ SOLN
INTRAMUSCULAR | Status: DC | PRN
  Administered 2021-01-19: 8 mg via INTRAVENOUS

## 2021-01-19 MED ORDER — FENTANYL CITRATE (PF) 100 MCG/2ML IJ SOLN
INTRAMUSCULAR | Status: AC
  Filled 2021-01-19: qty 2

## 2021-01-19 MED ORDER — ONDANSETRON HCL 4 MG/2ML IJ SOLN
INTRAMUSCULAR | Status: DC | PRN
  Administered 2021-01-19: 4 mg via INTRAVENOUS

## 2021-01-19 MED ORDER — CIPROFLOXACIN IN D5W 400 MG/200ML IV SOLN
400.0000 mg | INTRAVENOUS | Status: AC
  Administered 2021-01-19: 400 mg via INTRAVENOUS
  Filled 2021-01-19: qty 200

## 2021-01-19 MED ORDER — LACTATED RINGERS IV SOLN: INTRAVENOUS | Status: DC

## 2021-01-19 MED ORDER — SODIUM CHLORIDE 0.9 % IR SOLN
Status: DC | PRN
  Administered 2021-01-19: 3000 mL via INTRAVESICAL

## 2021-01-19 MED ORDER — DEXAMETHASONE SODIUM PHOSPHATE 10 MG/ML IJ SOLN
INTRAMUSCULAR | Status: AC
  Filled 2021-01-19: qty 1

## 2021-01-19 MED ORDER — FENTANYL CITRATE (PF) 100 MCG/2ML IJ SOLN
INTRAMUSCULAR | Status: DC | PRN
  Administered 2021-01-19: 50 ug via INTRAVENOUS

## 2021-01-19 MED ORDER — IOHEXOL 300 MG/ML  SOLN
INTRAMUSCULAR | Status: DC | PRN
  Administered 2021-01-19: 20 mL

## 2021-01-19 SURGICAL SUPPLY — 12 items
BAG URO CATCHER STRL LF (MISCELLANEOUS) ×2 IMPLANT
CATH URET 5FR 28IN OPEN ENDED (CATHETERS) ×4 IMPLANT
CLOTH BEACON ORANGE TIMEOUT ST (SAFETY) ×2 IMPLANT
GLOVE SURG ENC TEXT LTX SZ7.5 (GLOVE) ×2 IMPLANT
GOWN STRL REUS W/TWL XL LVL3 (GOWN DISPOSABLE) ×2 IMPLANT
GUIDEWIRE STR DUAL SENSOR (WIRE) IMPLANT
GUIDEWIRE ZIPWRE .038 STRAIGHT (WIRE) ×4 IMPLANT
KIT TURNOVER KIT A (KITS) ×2 IMPLANT
MANIFOLD NEPTUNE II (INSTRUMENTS) ×2 IMPLANT
PACK CYSTO (CUSTOM PROCEDURE TRAY) ×2 IMPLANT
STENT URET 6FRX26 CONTOUR (STENTS) ×2 IMPLANT
TUBING CONNECTING 10 (TUBING) ×2 IMPLANT

## 2021-01-19 NOTE — Anesthesia Procedure Notes (Signed)
Procedure Name: LMA Insertion Date/Time: 01/19/2021 1:15 PM Performed by: West Pugh, CRNA Pre-anesthesia Checklist: Patient identified, Emergency Drugs available, Suction available, Patient being monitored and Timeout performed Patient Re-evaluated:Patient Re-evaluated prior to induction Oxygen Delivery Method: Circle system utilized Preoxygenation: Pre-oxygenation with 100% oxygen Induction Type: IV induction LMA: LMA with gastric port inserted LMA Size: 5.0 Number of attempts: 1 Placement Confirmation: positive ETCO2 and breath sounds checked- equal and bilateral Tube secured with: Tape Dental Injury: Teeth and Oropharynx as per pre-operative assessment

## 2021-01-19 NOTE — Transfer of Care (Signed)
Immediate Anesthesia Transfer of Care Note  Patient: Patrick Bishop  Procedure(s) Performed: CYSTOSCOPY WITH STENT EXCHANGE (Left )  Patient Location: PACU  Anesthesia Type:General  Level of Consciousness: awake, drowsy and patient cooperative  Airway & Oxygen Therapy: Patient Spontanous Breathing and Patient connected to face mask oxygen  Post-op Assessment: Report given to RN and Post -op Vital signs reviewed and stable  Post vital signs: Reviewed and stable  Last Vitals:  Vitals Value Taken Time  BP 114/72 01/19/21 1348  Temp    Pulse 64 01/19/21 1351  Resp 17 01/19/21 1351  SpO2 100 % 01/19/21 1351  Vitals shown include unvalidated device data.  Last Pain:  Vitals:   01/19/21 1146  TempSrc:   PainSc: 5       Patients Stated Pain Goal: 4 (08/81/10 3159)  Complications: No complications documented.

## 2021-01-19 NOTE — Op Note (Addendum)
Operative Note  Preoperative diagnosis:  1.  Muscle invasive bladder cancer 2.  Left ureteral obstruction  Postoperative diagnosis: 1.  Muscle invasive bladder cancer 2.  Left ureteral obstruction  Procedure(s): 1.  Cystoscopy with left JJ stent exchange 2.  Left retrograde pyelogram with intraoperative interpretation of fluoroscopic imaging  Surgeon: Ellison Hughs, MD  Assistants:  None  Anesthesia:  General  Complications:  None  EBL: Less than 5 mL  Specimens: 1.  Previously placed left JJ stent was removed intact, inspected and discarded  Drains/Catheters: 1.  6 French, 26 cm JJ stent without tether  Intraoperative findings:   1. Left retrograde pyelogram revealed a tortuous left ureter with contrast jetting at the level of the left UPJ, suggestive of stenosis.  No filling defects were seen along the entire length of the left ureter or within the dilated left renal pelvis. 2. Edematous appearing bladder mucosa adjacent to the left ureteral orifice that could represent postradiation mucosal changes versus papillary tumor. 3. Stenotic left ureteral orifice  Indication:  Patrick Bishop is a 80 y.o. male with a history of muscle invasive bladder cancer with direct involvement of the left ureteral orifice causing left ureteral obstruction.  He is status post TURBT on 10/21/2020 with an incomplete resection of his bladder tumor.  He is currently undergoing chemotherapy and radiation as primary treatment of his muscle invasive bladder cancer.  He is here today for left ureteral stent exchange.  He has been consented for the above procedures, voices understanding and wishes to proceed.  Description of procedure:  After informed consent was obtained, the patient was brought to the operating room and general LMA anesthesia was administered. The patient was then placed in the dorsolithotomy position and prepped and draped in the usual sterile fashion. A timeout was performed. A  23 French rigid cystoscope was then inserted into the urethral meatus and advanced into the bladder under direct vision. A complete bladder survey revealed mucosal edema adjacent to the left ureteral orifice.  The edematous appearing bladder mucosa adjacent to the left ureteral orifice could represent postradiation mucosal changes vs papillary tumor.  His previously placed left ureteral stent was then grasped at its distal curl and retracted to the urethral meatus.  A Glidewire was then advanced up the lumen of the stent to the left renal pelvis, or fluoroscopic guidance.  The previously placed stent was then removed intact, inspected and discarded.  A 5 French ureteral catheter was then inserted into the left ureteral orifice and a retrograde pyelogram was obtained, with the findings listed above.  A Glidewire was then used to intubate the lumen of the ureteral catheter and was advanced up to the left renal pelvis, under fluoroscopic guidance.  The catheter was then removed, leaving the wire in place.  A new 6 [place], 26 cm JJ stent was then advanced over the wire and into good position within the left collecting system, confirming placement via fluoroscopy.  The patient's bladder was drained.  He tolerated the procedure well and was transferred to the postanesthesia in stable condition.  Plan: Plan for stent exchange in approximately 3 months.

## 2021-01-19 NOTE — H&P (Signed)
PRE-OP H&P  Office Visit Report     12/28/2020   --------------------------------------------------------------------------------   Patrick Bishop  MRN: [id number]  DOB: September 30, 1940, 80 year old Male  SSN:    PRIMARY CARE:  [name] [name], MD  REFERRING:  Ammie Dalton, NP  PROVIDER:  Ellison Hughs, M.D.  LOCATION:  [facility] [phone]     --------------------------------------------------------------------------------   CC/HPI: Bladder cancer   [name] is a 80 year old male with a history of kidney stones, CHF, A-fib (on lovenox), PVD, LE lymphedema, HTN, HLD, obesity and OSA. 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 Currently receiving chemo/radiation as primary treatment.   10/29/2020: The patient is here today for routine follow-up following TURBT and left ureteral stent placement on 10/21/2020. Pathologic analysis of the bladder mass sample revealed high-grade muscle invasive urothelial carcinoma. The patient has done well after surgery and denies any significant issue with gross hematuria, left flank pain or pain with his Foley catheter. He is accompanied by his daughter [name] at today's visit.   11/04/2020: Unsuccessful trial of void at last office visit. His catheter was replaced and patient started on tamsulosin. His urologist felt it was imperative to leave indwelling left ureteral stent until further follow-up in evaluation could occur. He has pending evaluation with Dr Tresa Moore, Dr Alen Blew and staging CT imaging in the near future. He is here today for consideration of repeat trial of void.   Patient tolerating tamsulosin well without any noted side effect. Catheter has been draining appropriately. He has had minimal hematuria, no exacerbation of left-sided pain/discomfort. He denies painful urgency or significant leaking  around Catheter tubing. No interval fevers or chills, nausea/vomiting. Patient has follow-up with Dr Alen Blew tomorrow and is scheduled for CT imaging next week. He is scheduled to see Dr Tresa Moore in March.   12/28/2020: The patient is here today for a routine follow-up with his daughter [name] He recently started chemoradiation to treat his muscle invasive bladder cancer. He is tolerating treatments well thus far. He reports stable urinary urgency since his last visit and states that he has an adequate force of stream and feels like he is emptying his bladder well. He denies interval UTIs or flank pain, but does have episodes of dysuria and gross hematuria. No recent staging imaging has been performed.     ALLERGIES: Aldactone TABS    MEDICATIONS: Tamsulosin Hcl 0.4 mg capsule 1 capsule PO Daily  Amlodipine Besylate 2.5 mg tablet tablet  Atorvastatin Calcium 10 mg tablet  Hydrocodone-Acetaminophen 5 mg-325 mg tablet  Magnesium  Potassium  Torsemide  Xarelto 20 mg tablet     Notes: pt unaware of his medications   GU PSH: Cysto Remove Stent FB Sim - 11/13/2019 Cystoscopy Insert Stent, Left - 10/21/2020 Cystoscopy TURBT 2-5 cm - 10/21/2020 Ureteroscopic laser litho, Left - 11/06/2019       PSH Notes: Surgical History  06/24/2019 Ir nephrostomy placement left  04/26/2017 Transthoracic echocardiogram  11/2014 ABDOMINAL VENOUS DUPLEX [Other] (Bilateral)  10/2014 Ir transcath plc stent ea add vein inc angioplasty (Left)  08/2014 Ir transcath plc stent initial vein inc angioplasty (Right)  07/2014 ABDOMINAL VENOUS DUPLEX [Other] (Bilateral)  02/2010 Transthoracic echocardiogram  10/2009 Cardiac catheterization  08/2009 CARDIAC PET [Other]  11/2005 Nm myoview ltd  Date Unknown Colonoscopy  Date Unknown Esophagogastroduodenoscopy  Before 2015 GREATER SAPHENOUS VEIN ABLATION [Other] (Bilateral)  NON-GU PSH: Cardiac Stent Placement     GU PMH: Bladder Cancer Trigone - 11/04/2020, Muscle  invasive bladder cancer involving the left fore trigone with direct involvement of the left ureteral orifice, status post partial tumor resection on 10/21/2020., - 10/29/2020 Hydronephrosis - 11/04/2020, (Stable), - 10/29/2020, - 08/18/2020 Incomplete bladder emptying - 10/29/2020 Acute kidney failure, Acute on Chronic. Creatinine has been trending upwards over the past year. It was 1.2 in January prior to stone procedure, now 1.99. 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 -    Atrial fibrillation (HCC)    Congestive heart failure (HCC)  Respiratory  OSA on CPAP  Digestive    Cholelithiasis  Nervous and Auditory    Hearing loss  Musculoskeletal and Integument    Arthritis  Genitourinary    Septic shock due to urinary tract infection (HCC)    Hydronephrosis with obstructing calculus  Other  Hyperlipidemia with target LDL less than 100  Morbid obesity (HCC)    Chronic pain    Lymphedema    Muscle weakness    Unsteady gait    Elevated troponin    Heme + stool    Decreased hemoglobin      NON-GU PMH: Bacteriuria - 10/07/2019 Pyuria/other UA findings - 10/07/2019    FAMILY HISTORY: Cancer - Mother Hypertension - Father, Brother   SOCIAL HISTORY: Marital Status: Widowed Preferred Language: English; Race: White Current Smoking Status: Patient does not smoke anymore.   Tobacco Use Assessment Completed: Used Tobacco in last 30 days? Has never drank.  Does not drink caffeine.    REVIEW OF SYSTEMS:    GU Review Male:   Patient denies frequent urination, hard to postpone urination, burning/ pain with urination, get up at night  to urinate, leakage of urine, stream starts and stops, trouble starting your stream, have to strain to urinate , erection problems, and penile pain.  Gastrointestinal (Upper):   Patient denies nausea, vomiting, and indigestion/ heartburn.  Gastrointestinal (Lower):   Patient denies diarrhea and constipation.  Constitutional:   Patient denies fever, night sweats, weight loss, and fatigue.  Skin:   Patient denies skin rash/ lesion and itching.  Eyes:   Patient denies blurred vision and double vision.  Ears/ Nose/ Throat:   Patient denies sore throat and sinus problems.  Hematologic/Lymphatic:   Patient denies swollen glands and easy bruising.  Cardiovascular:   Patient denies leg swelling and chest pains.  Respiratory:   Patient denies cough and shortness of breath.  Endocrine:   Patient denies excessive thirst.  Musculoskeletal:   Patient denies back pain and joint pain.  Neurological:   Patient denies headaches and dizziness.  Psychologic:   Patient denies depression and anxiety.   VITAL SIGNS:      12/28/2020 12:53 PM  Weight 320 lb / 145.15 kg  BP 112/70 mmHg  Pulse 70 /min  Temperature 97.1 F / 36.1 C   MULTI-SYSTEM PHYSICAL EXAMINATION:    Constitutional: Well-nourished. No physical deformities. Normally developed. Good grooming.  Neck: Neck symmetrical, not swollen. Normal tracheal position.  Neurologic / Psychiatric: Oriented to time, oriented to place, oriented to person. No depression, no anxiety, no agitation.   Gastrointestinal:  Obese abdomen. No mass, no tenderness, no rigidity.   Musculoskeletal: Normal gait and station of head and neck.     Complexity of Data:  Records Review:   Pathology Reports, Previous Patient Records   PROCEDURES:          Urinalysis w/Scope Dipstick Dipstick Cont'd Micro  Color: Yellow Bilirubin: Neg mg/dL WBC/hpf: >60/hpf  Appearance: Cloudy Ketones: Neg mg/dL RBC/hpf: >60/hpf  Specific Gravity: 1.020 Blood: 3+ ery/uL Bacteria: Mod  (26-50/hpf)  pH: 5.5 Protein: 2+ mg/dL Cystals: NS (Not Seen)  Glucose: Neg mg/dL Urobilinogen: 0.2 mg/dL Casts: NS (Not Seen)    Nitrites: Neg Trichomonas: Not Present    Leukocyte Esterase: 3+ leu/uL Mucous: Not Present      Epithelial Cells: 0 - 5/hpf      Yeast: NS (Not Seen)      Sperm: Not Present    ASSESSMENT:      ICD-10 Details  1 GU:   Bladder Cancer Trigone - C67.0 Chronic, Stable  2   Hydronephrosis - N13.0 Left, Chronic, Stable  3 NON-GU:   Pyuria/other UA findings - B58.30 Acute, Uncomplicated   PLAN:           Orders Labs Urine Culture  X-Rays: C.T. Chest/ Abd/Pelvis With I.V. Contrast          Schedule Return Visit/Planned Activity: Next Available Appointment - Schedule Surgery          Document Letter(s):  Created for Patient: Clinical Summary   Created for [name] [name], MD         Notes:   -The risks, benefits and alternatives of cystoscopy with LEFT JJ stent placement was discussed with the patient. Risks include, but are not limited to: bleeding, urinary tract infection, ureteral injury, ureteral stricture disease, chronic pain, urinary symptoms, bladder injury, stent migration, the need for nephrostomy tube placement, MI, CVA, DVT, PE and the inherent risks with general anesthesia. The patient voices understanding and wishes to proceed.

## 2021-01-20 ENCOUNTER — Inpatient Hospital Stay: Payer: Medicare Other

## 2021-01-20 ENCOUNTER — Other Ambulatory Visit: Payer: Self-pay

## 2021-01-20 ENCOUNTER — Ambulatory Visit
Admission: RE | Admit: 2021-01-20 | Discharge: 2021-01-20 | Disposition: A | Discharge status: Home / Self Care | Payer: Medicare Other | Source: Ambulatory Visit | Attending: Radiation Oncology | Admitting: Radiation Oncology

## 2021-01-20 ENCOUNTER — Encounter (HOSPITAL_COMMUNITY): Payer: Self-pay | Admitting: Urology

## 2021-01-20 VITALS — BP 104/53 | HR 71 | Temp 97.7°F | Resp 18

## 2021-01-20 DIAGNOSIS — C670 Malignant neoplasm of trigone of bladder: Secondary | ICD-10-CM

## 2021-01-20 DIAGNOSIS — C679 Malignant neoplasm of bladder, unspecified: Secondary | ICD-10-CM

## 2021-01-20 DIAGNOSIS — C678 Malignant neoplasm of overlapping sites of bladder: Secondary | ICD-10-CM | POA: Diagnosis not present

## 2021-01-20 DIAGNOSIS — Z510 Encounter for antineoplastic radiation therapy: Secondary | ICD-10-CM | POA: Diagnosis not present

## 2021-01-20 LAB — CMP (CANCER CENTER ONLY)
ALT: 9 U/L (ref 0–44)
AST: 12 U/L — ABNORMAL LOW (ref 15–41)
Albumin: 3.3 g/dL — ABNORMAL LOW (ref 3.5–5.0)
Alkaline Phosphatase: 67 U/L (ref 38–126)
Anion gap: 10 (ref 5–15)
BUN: 29 mg/dL — ABNORMAL HIGH (ref 8–23)
CO2: 24 mmol/L (ref 22–32)
Calcium: 8.2 mg/dL — ABNORMAL LOW (ref 8.9–10.3)
Chloride: 106 mmol/L (ref 98–111)
Creatinine: 1.91 mg/dL — ABNORMAL HIGH (ref 0.61–1.24)
GFR, Estimated: 35 mL/min — ABNORMAL LOW (ref >60)
Glucose, Bld: 108 mg/dL — ABNORMAL HIGH (ref 70–99)
Potassium: 3.9 mmol/L (ref 3.5–5.1)
Sodium: 140 mmol/L (ref 135–145)
Total Bilirubin: 1.3 mg/dL — ABNORMAL HIGH (ref 0.3–1.2)
Total Protein: 6.4 g/dL — ABNORMAL LOW (ref 6.5–8.1)

## 2021-01-20 LAB — CBC WITH DIFFERENTIAL (CANCER CENTER ONLY)
Abs Immature Granulocytes: 0.01 10*3/uL (ref 0.00–0.07)
Basophils Absolute: 0 10*3/uL (ref 0.0–0.1)
Basophils Relative: 1 %
Eosinophils Absolute: 0.1 10*3/uL (ref 0.0–0.5)
Eosinophils Relative: 2 %
HCT: 32.3 % — ABNORMAL LOW (ref 39.0–52.0)
Hemoglobin: 10.7 g/dL — ABNORMAL LOW (ref 13.0–17.0)
Immature Granulocytes: 0 %
Lymphocytes Relative: 13 %
Lymphs Abs: 0.3 10*3/uL — ABNORMAL LOW (ref 0.7–4.0)
MCH: 31.2 pg (ref 26.0–34.0)
MCHC: 33.1 g/dL (ref 30.0–36.0)
MCV: 94.2 fL (ref 80.0–100.0)
Monocytes Absolute: 0.4 10*3/uL (ref 0.1–1.0)
Monocytes Relative: 16 %
Neutro Abs: 1.8 10*3/uL (ref 1.7–7.7)
Neutrophils Relative %: 68 %
Platelet Count: 88 10*3/uL — ABNORMAL LOW (ref 150–400)
RBC: 3.43 MIL/uL — ABNORMAL LOW (ref 4.22–5.81)
RDW: 15.3 % (ref 11.5–15.5)
WBC Count: 2.7 10*3/uL — ABNORMAL LOW (ref 4.0–10.5)
nRBC: 0 % (ref 0.0–0.2)

## 2021-01-20 MED ORDER — SODIUM CHLORIDE 0.9 % IV SOLN
Freq: Once | INTRAVENOUS | Status: AC
Start: 2021-01-20 — End: 2021-01-20
  Filled 2021-01-20: qty 250

## 2021-01-20 MED ORDER — SODIUM CHLORIDE 0.9 % IV SOLN
110.0000 mg | Freq: Once | INTRAVENOUS | Status: AC
  Administered 2021-01-20: 110 mg via INTRAVENOUS
  Filled 2021-01-20: qty 11

## 2021-01-20 MED ORDER — PALONOSETRON HCL INJECTION 0.25 MG/5ML
0.2500 mg | Freq: Once | INTRAVENOUS | Status: AC
  Administered 2021-01-20: 0.25 mg via INTRAVENOUS

## 2021-01-20 MED ORDER — SODIUM CHLORIDE 0.9 % IV SOLN
10.0000 mg | Freq: Once | INTRAVENOUS | Status: AC
  Administered 2021-01-20: 10 mg via INTRAVENOUS
  Filled 2021-01-20: qty 10

## 2021-01-20 MED ORDER — PALONOSETRON HCL INJECTION 0.25 MG/5ML
INTRAVENOUS | Status: AC
  Filled 2021-01-20: qty 5

## 2021-01-20 NOTE — Anesthesia Postprocedure Evaluation (Signed)
Anesthesia Post Note  Patient: Patrick Bishop  Procedure(s) Performed: CYSTOSCOPY WITH STENT EXCHANGE (Left )     Patient location during evaluation: PACU Anesthesia Type: General Level of consciousness: sedated and patient cooperative Pain management: pain level controlled Vital Signs Assessment: post-procedure vital signs reviewed and stable Respiratory status: spontaneous breathing Cardiovascular status: stable Anesthetic complications: no   No complications documented.  Last Vitals:  Vitals:   01/19/21 1500 01/19/21 1515  BP: 134/65 (!) 104/53  Pulse: 62 65  Resp: 18 19  Temp: 36.4 C 36.4 C  SpO2: 97% 97%    Last Pain:  Vitals:   01/19/21 1515  TempSrc:   PainSc: 0-No pain                 Nolon Nations

## 2021-01-20 NOTE — Progress Notes (Signed)
Per Dr. Alen Blew, ok to treat with Plt 88 and Creat 1.91

## 2021-01-20 NOTE — Patient Instructions (Signed)
[facility]COLOG[facility]  Discharge Instructions: Thank you for choosing [facility] to provide your oncology and hematology care.   If you have a lab appointment with the Cancer Center, please go directly to the Cancer Center and check in at the registration area.   Wear comfortable clothing and clothing appropriate for easy access to any Portacath or PICC line.   We strive to give you quality time with your provider. You may need to reschedule your appointment if you arrive late (15 or more minutes).  Arriving late affects you and other patients whose appointments are after yours.  Also, if you miss three or more appointments without notifying the office, you may be dismissed from the clinic at the provider's discretion.      For prescription refill requests, have your pharmacy contact our office and allow 72 hours for refills to be completed.    Today you received the following chemotherapy and/or immunotherapy agents carboplatin      To help prevent nausea and vomiting after your treatment, we encourage you to take your nausea medication as directed.  BELOW ARE SYMPTOMS THAT SHOULD BE REPORTED IMMEDIATELY: *FEVER GREATER THAN 100.4 F (38 C) OR HIGHER *CHILLS OR SWEATING *NAUSEA AND VOMITING THAT IS NOT CONTROLLED WITH YOUR NAUSEA MEDICATION *UNUSUAL SHORTNESS OF BREATH *UNUSUAL BRUISING OR BLEEDING *URINARY PROBLEMS (pain or burning when urinating, or frequent urination) *BOWEL PROBLEMS (unusual diarrhea, constipation, pain near the anus) TENDERNESS IN MOUTH AND THROAT WITH OR WITHOUT PRESENCE OF ULCERS (sore throat, sores in mouth, or a toothache) UNUSUAL RASH, SWELLING OR PAIN  UNUSUAL VAGINAL DISCHARGE OR ITCHING   Items with * indicate a potential emergency and should be followed up as soon as possible or go to the Emergency Department if any problems should occur.  Please show the CHEMOTHERAPY ALERT CARD or IMMUNOTHERAPY ALERT CARD at check-in to  the Emergency Department and triage nurse.  Should you have questions after your visit or need to cancel or reschedule your appointment, please contact [facility]  Dept: [phone]  and follow the prompts.  Office hours are 8:00 a.m. to 4:30 p.m. Monday - Friday. Please note that voicemails left after 4:00 p.m. may not be returned until the following business day.  We are closed weekends and major holidays. You have access to a nurse at all times for urgent questions. Please call the main number to the clinic Dept: [phone] and follow the prompts.   For any non-urgent questions, you may also contact your provider using MyChart. We now offer e-Visits for anyone 18 and older to request care online for non-urgent symptoms. For details visit [email or web address].   Also download the MyChart app! Go to the app store, search "MyChart", open the app, select [facility], and log in with your MyChart username and password.  Due to Covid, a mask is required upon entering the hospital/clinic. If you do not have a mask, one will be given to you upon arrival. For doctor visits, patients may have 1 support person aged 18 or older with them. For treatment visits, patients cannot have anyone with them due to current Covid guidelines and our immunocompromised population.   

## 2021-01-21 ENCOUNTER — Ambulatory Visit
Admission: RE | Admit: 2021-01-21 | Discharge: 2021-01-21 | Disposition: A | Discharge status: Home / Self Care | Payer: Medicare Other | Source: Ambulatory Visit | Attending: Radiation Oncology | Admitting: Radiation Oncology

## 2021-01-21 DIAGNOSIS — C678 Malignant neoplasm of overlapping sites of bladder: Secondary | ICD-10-CM | POA: Diagnosis not present

## 2021-01-21 DIAGNOSIS — M1710 Unilateral primary osteoarthritis, unspecified knee: Secondary | ICD-10-CM | POA: Diagnosis not present

## 2021-01-21 DIAGNOSIS — C679 Malignant neoplasm of bladder, unspecified: Secondary | ICD-10-CM | POA: Diagnosis not present

## 2021-01-21 DIAGNOSIS — G894 Chronic pain syndrome: Secondary | ICD-10-CM | POA: Diagnosis not present

## 2021-01-21 DIAGNOSIS — M169 Osteoarthritis of hip, unspecified: Secondary | ICD-10-CM | POA: Diagnosis not present

## 2021-01-21 DIAGNOSIS — Z510 Encounter for antineoplastic radiation therapy: Secondary | ICD-10-CM | POA: Diagnosis not present

## 2021-01-21 DIAGNOSIS — I739 Peripheral vascular disease, unspecified: Secondary | ICD-10-CM | POA: Diagnosis not present

## 2021-01-22 ENCOUNTER — Ambulatory Visit
Admission: RE | Admit: 2021-01-22 | Discharge: 2021-01-22 | Disposition: A | Discharge status: Home / Self Care | Payer: Medicare Other | Source: Ambulatory Visit | Attending: Radiation Oncology | Admitting: Radiation Oncology

## 2021-01-22 ENCOUNTER — Other Ambulatory Visit: Payer: Self-pay

## 2021-01-22 DIAGNOSIS — Z510 Encounter for antineoplastic radiation therapy: Secondary | ICD-10-CM | POA: Diagnosis not present

## 2021-01-22 DIAGNOSIS — C678 Malignant neoplasm of overlapping sites of bladder: Secondary | ICD-10-CM | POA: Diagnosis not present

## 2021-01-22 DIAGNOSIS — C679 Malignant neoplasm of bladder, unspecified: Secondary | ICD-10-CM | POA: Diagnosis not present

## 2021-01-24 DIAGNOSIS — C678 Malignant neoplasm of overlapping sites of bladder: Secondary | ICD-10-CM | POA: Diagnosis not present

## 2021-01-25 ENCOUNTER — Ambulatory Visit
Admission: RE | Admit: 2021-01-25 | Discharge: 2021-01-25 | Disposition: A | Discharge status: Home / Self Care | Payer: Medicare Other | Source: Ambulatory Visit | Attending: Radiation Oncology | Admitting: Radiation Oncology

## 2021-01-25 ENCOUNTER — Other Ambulatory Visit: Payer: Self-pay

## 2021-01-25 DIAGNOSIS — C679 Malignant neoplasm of bladder, unspecified: Secondary | ICD-10-CM | POA: Insufficient documentation

## 2021-01-25 DIAGNOSIS — C678 Malignant neoplasm of overlapping sites of bladder: Secondary | ICD-10-CM | POA: Diagnosis not present

## 2021-01-25 DIAGNOSIS — Z510 Encounter for antineoplastic radiation therapy: Secondary | ICD-10-CM | POA: Diagnosis not present

## 2021-01-26 ENCOUNTER — Ambulatory Visit
Admission: RE | Admit: 2021-01-26 | Discharge: 2021-01-26 | Disposition: A | Discharge status: Home / Self Care | Payer: Medicare Other | Source: Ambulatory Visit | Attending: Radiation Oncology | Admitting: Radiation Oncology

## 2021-01-26 DIAGNOSIS — C678 Malignant neoplasm of overlapping sites of bladder: Secondary | ICD-10-CM | POA: Diagnosis not present

## 2021-01-26 DIAGNOSIS — Z510 Encounter for antineoplastic radiation therapy: Secondary | ICD-10-CM | POA: Diagnosis not present

## 2021-01-26 DIAGNOSIS — C679 Malignant neoplasm of bladder, unspecified: Secondary | ICD-10-CM | POA: Diagnosis not present

## 2021-01-27 ENCOUNTER — Ambulatory Visit
Admission: RE | Admit: 2021-01-27 | Discharge: 2021-01-27 | Disposition: A | Discharge status: Home / Self Care | Payer: Medicare Other | Source: Ambulatory Visit | Attending: Radiation Oncology | Admitting: Radiation Oncology

## 2021-01-27 ENCOUNTER — Other Ambulatory Visit: Payer: Self-pay | Admitting: Oncology

## 2021-01-27 ENCOUNTER — Inpatient Hospital Stay: Payer: Medicare Other

## 2021-01-27 ENCOUNTER — Other Ambulatory Visit: Payer: Self-pay

## 2021-01-27 VITALS — BP 111/64 | HR 66 | Temp 98.1°F | Resp 16 | Wt 291.0 lb

## 2021-01-27 DIAGNOSIS — Z5111 Encounter for antineoplastic chemotherapy: Secondary | ICD-10-CM | POA: Insufficient documentation

## 2021-01-27 DIAGNOSIS — C679 Malignant neoplasm of bladder, unspecified: Secondary | ICD-10-CM | POA: Insufficient documentation

## 2021-01-27 DIAGNOSIS — C670 Malignant neoplasm of trigone of bladder: Secondary | ICD-10-CM

## 2021-01-27 DIAGNOSIS — Z510 Encounter for antineoplastic radiation therapy: Secondary | ICD-10-CM | POA: Diagnosis not present

## 2021-01-27 DIAGNOSIS — C678 Malignant neoplasm of overlapping sites of bladder: Secondary | ICD-10-CM | POA: Diagnosis not present

## 2021-01-27 LAB — CMP (CANCER CENTER ONLY)
ALT: 10 U/L (ref 0–44)
AST: 17 U/L (ref 15–41)
Albumin: 3.3 g/dL — ABNORMAL LOW (ref 3.5–5.0)
Alkaline Phosphatase: 69 U/L (ref 38–126)
Anion gap: 8 (ref 5–15)
BUN: 29 mg/dL — ABNORMAL HIGH (ref 8–23)
CO2: 25 mmol/L (ref 22–32)
Calcium: 8.6 mg/dL — ABNORMAL LOW (ref 8.9–10.3)
Chloride: 108 mmol/L (ref 98–111)
Creatinine: 1.88 mg/dL — ABNORMAL HIGH (ref 0.61–1.24)
GFR, Estimated: 36 mL/min — ABNORMAL LOW (ref >60)
Glucose, Bld: 96 mg/dL (ref 70–99)
Potassium: 4.8 mmol/L (ref 3.5–5.1)
Sodium: 141 mmol/L (ref 135–145)
Total Bilirubin: 1.1 mg/dL (ref 0.3–1.2)
Total Protein: 6.3 g/dL — ABNORMAL LOW (ref 6.5–8.1)

## 2021-01-27 LAB — CBC WITH DIFFERENTIAL (CANCER CENTER ONLY)
Abs Immature Granulocytes: 0.02 10*3/uL (ref 0.00–0.07)
Basophils Absolute: 0 10*3/uL (ref 0.0–0.1)
Basophils Relative: 0 %
Eosinophils Absolute: 0.1 10*3/uL (ref 0.0–0.5)
Eosinophils Relative: 2 %
HCT: 30.5 % — ABNORMAL LOW (ref 39.0–52.0)
Hemoglobin: 10.3 g/dL — ABNORMAL LOW (ref 13.0–17.0)
Immature Granulocytes: 1 %
Lymphocytes Relative: 14 %
Lymphs Abs: 0.4 10*3/uL — ABNORMAL LOW (ref 0.7–4.0)
MCH: 31.7 pg (ref 26.0–34.0)
MCHC: 33.8 g/dL (ref 30.0–36.0)
MCV: 93.8 fL (ref 80.0–100.0)
Monocytes Absolute: 0.4 10*3/uL (ref 0.1–1.0)
Monocytes Relative: 12 %
Neutro Abs: 2.1 10*3/uL (ref 1.7–7.7)
Neutrophils Relative %: 71 %
Platelet Count: 101 10*3/uL — ABNORMAL LOW (ref 150–400)
RBC: 3.25 MIL/uL — ABNORMAL LOW (ref 4.22–5.81)
RDW: 15.5 % (ref 11.5–15.5)
WBC Count: 2.9 10*3/uL — ABNORMAL LOW (ref 4.0–10.5)
nRBC: 0 % (ref 0.0–0.2)

## 2021-01-27 MED ORDER — PALONOSETRON HCL INJECTION 0.25 MG/5ML
INTRAVENOUS | Status: AC
  Filled 2021-01-27: qty 5

## 2021-01-27 MED ORDER — SODIUM CHLORIDE 0.9 % IV SOLN
110.0000 mg | Freq: Once | INTRAVENOUS | Status: AC
  Administered 2021-01-27: 110 mg via INTRAVENOUS
  Filled 2021-01-27: qty 11

## 2021-01-27 MED ORDER — SODIUM CHLORIDE 0.9 % IV SOLN
Freq: Once | INTRAVENOUS | Status: AC
  Filled 2021-01-27: qty 250

## 2021-01-27 MED ORDER — SODIUM CHLORIDE 0.9 % IV SOLN
10.0000 mg | Freq: Once | INTRAVENOUS | Status: AC
  Administered 2021-01-27: 10 mg via INTRAVENOUS
  Filled 2021-01-27: qty 10

## 2021-01-27 MED ORDER — PALONOSETRON HCL INJECTION 0.25 MG/5ML
0.2500 mg | Freq: Once | INTRAVENOUS | Status: AC
  Administered 2021-01-27: 0.25 mg via INTRAVENOUS

## 2021-01-27 NOTE — Patient Instructions (Signed)
[facility]  Discharge Instructions: Thank you for choosing [facility] to provide your oncology and hematology care.   If you have a lab appointment with the Cancer Center, please go directly to the Cancer Center and check in at the registration area.   Wear comfortable clothing and clothing appropriate for easy access to any Portacath or PICC line.   We strive to give you quality time with your provider. You may need to reschedule your appointment if you arrive late (15 or more minutes).  Arriving late affects you and other patients whose appointments are after yours.  Also, if you miss three or more appointments without notifying the office, you may be dismissed from the clinic at the provider's discretion.      For prescription refill requests, have your pharmacy contact our office and allow 72 hours for refills to be completed.    Today you received the following chemotherapy and/or immunotherapy agents carboplatin      To help prevent nausea and vomiting after your treatment, we encourage you to take your nausea medication as directed.  BELOW ARE SYMPTOMS THAT SHOULD BE REPORTED IMMEDIATELY: *FEVER GREATER THAN 100.4 F (38 C) OR HIGHER *CHILLS OR SWEATING *NAUSEA AND VOMITING THAT IS NOT CONTROLLED WITH YOUR NAUSEA MEDICATION *UNUSUAL SHORTNESS OF BREATH *UNUSUAL BRUISING OR BLEEDING *URINARY PROBLEMS (pain or burning when urinating, or frequent urination) *BOWEL PROBLEMS (unusual diarrhea, constipation, pain near the anus) TENDERNESS IN MOUTH AND THROAT WITH OR WITHOUT PRESENCE OF ULCERS (sore throat, sores in mouth, or a toothache) UNUSUAL RASH, SWELLING OR PAIN  UNUSUAL VAGINAL DISCHARGE OR ITCHING   Items with * indicate a potential emergency and should be followed up as soon as possible or go to the Emergency Department if any problems should occur.  Please show the CHEMOTHERAPY ALERT CARD or IMMUNOTHERAPY ALERT CARD at check-in to  the Emergency Department and triage nurse.  Should you have questions after your visit or need to cancel or reschedule your appointment, please contact [facility]  Dept: [phone]  and follow the prompts.  Office hours are 8:00 a.m. to 4:30 p.m. Monday - Friday. Please note that voicemails left after 4:00 p.m. may not be returned until the following business day.  We are closed weekends and major holidays. You have access to a nurse at all times for urgent questions. Please call the main number to the clinic Dept: [phone] and follow the prompts.   For any non-urgent questions, you may also contact your provider using MyChart. We now offer e-Visits for anyone 18 and older to request care online for non-urgent symptoms. For details visit [email or web address].   Also download the MyChart app! Go to the app store, search "MyChart", open the app, select [facility], and log in with your MyChart username and password.  Due to Covid, a mask is required upon entering the hospital/clinic. If you do not have a mask, one will be given to you upon arrival. For doctor visits, patients may have 1 support person aged 18 or older with them. For treatment visits, patients cannot have anyone with them due to current Covid guidelines and our immunocompromised population.   

## 2021-01-27 NOTE — Progress Notes (Signed)
Per Dr. Alen Blew, ok to treat with Scr.

## 2021-01-28 ENCOUNTER — Ambulatory Visit
Admission: RE | Admit: 2021-01-28 | Discharge: 2021-01-28 | Disposition: A | Discharge status: Home / Self Care | Payer: Medicare Other | Source: Ambulatory Visit | Attending: Radiation Oncology | Admitting: Radiation Oncology

## 2021-01-28 DIAGNOSIS — R300 Dysuria: Secondary | ICD-10-CM | POA: Diagnosis not present

## 2021-01-28 DIAGNOSIS — C689 Malignant neoplasm of urinary organ, unspecified: Secondary | ICD-10-CM | POA: Diagnosis not present

## 2021-01-28 DIAGNOSIS — Z510 Encounter for antineoplastic radiation therapy: Secondary | ICD-10-CM | POA: Diagnosis not present

## 2021-01-28 DIAGNOSIS — I129 Hypertensive chronic kidney disease with stage 1 through stage 4 chronic kidney disease, or unspecified chronic kidney disease: Secondary | ICD-10-CM | POA: Diagnosis not present

## 2021-01-28 DIAGNOSIS — C679 Malignant neoplasm of bladder, unspecified: Secondary | ICD-10-CM | POA: Diagnosis not present

## 2021-01-28 DIAGNOSIS — R609 Edema, unspecified: Secondary | ICD-10-CM | POA: Diagnosis not present

## 2021-01-28 DIAGNOSIS — N189 Chronic kidney disease, unspecified: Secondary | ICD-10-CM | POA: Diagnosis not present

## 2021-01-28 DIAGNOSIS — C678 Malignant neoplasm of overlapping sites of bladder: Secondary | ICD-10-CM | POA: Diagnosis not present

## 2021-01-29 ENCOUNTER — Ambulatory Visit
Admission: RE | Admit: 2021-01-29 | Discharge: 2021-01-29 | Disposition: A | Discharge status: Home / Self Care | Payer: Medicare Other | Source: Ambulatory Visit | Attending: Radiation Oncology | Admitting: Radiation Oncology

## 2021-01-29 ENCOUNTER — Other Ambulatory Visit: Payer: Self-pay

## 2021-01-29 DIAGNOSIS — C678 Malignant neoplasm of overlapping sites of bladder: Secondary | ICD-10-CM | POA: Diagnosis not present

## 2021-01-29 DIAGNOSIS — C679 Malignant neoplasm of bladder, unspecified: Secondary | ICD-10-CM | POA: Diagnosis not present

## 2021-01-29 DIAGNOSIS — Z510 Encounter for antineoplastic radiation therapy: Secondary | ICD-10-CM | POA: Diagnosis not present

## 2021-02-01 ENCOUNTER — Ambulatory Visit
Admission: RE | Admit: 2021-02-01 | Discharge: 2021-02-01 | Disposition: A | Discharge status: Home / Self Care | Payer: Medicare Other | Source: Ambulatory Visit | Attending: Radiation Oncology | Admitting: Radiation Oncology

## 2021-02-01 ENCOUNTER — Other Ambulatory Visit: Payer: Self-pay

## 2021-02-01 DIAGNOSIS — C678 Malignant neoplasm of overlapping sites of bladder: Secondary | ICD-10-CM | POA: Diagnosis not present

## 2021-02-01 DIAGNOSIS — Z510 Encounter for antineoplastic radiation therapy: Secondary | ICD-10-CM | POA: Diagnosis not present

## 2021-02-01 DIAGNOSIS — C679 Malignant neoplasm of bladder, unspecified: Secondary | ICD-10-CM | POA: Diagnosis not present

## 2021-02-02 ENCOUNTER — Inpatient Hospital Stay: Payer: Medicare Other

## 2021-02-02 ENCOUNTER — Inpatient Hospital Stay (HOSPITAL_BASED_OUTPATIENT_CLINIC_OR_DEPARTMENT_OTHER): Payer: Medicare Other | Admitting: Oncology

## 2021-02-02 ENCOUNTER — Ambulatory Visit
Admission: RE | Admit: 2021-02-02 | Discharge: 2021-02-02 | Disposition: A | Discharge status: Home / Self Care | Payer: Medicare Other | Source: Ambulatory Visit | Attending: Radiation Oncology | Admitting: Radiation Oncology

## 2021-02-02 ENCOUNTER — Telehealth: Payer: Self-pay | Admitting: Dietician

## 2021-02-02 VITALS — BP 103/58 | HR 72 | Temp 97.0°F | Resp 18 | Wt 311.8 lb

## 2021-02-02 DIAGNOSIS — C670 Malignant neoplasm of trigone of bladder: Secondary | ICD-10-CM

## 2021-02-02 DIAGNOSIS — Z510 Encounter for antineoplastic radiation therapy: Secondary | ICD-10-CM | POA: Diagnosis not present

## 2021-02-02 DIAGNOSIS — C679 Malignant neoplasm of bladder, unspecified: Secondary | ICD-10-CM

## 2021-02-02 DIAGNOSIS — C678 Malignant neoplasm of overlapping sites of bladder: Secondary | ICD-10-CM | POA: Diagnosis not present

## 2021-02-02 LAB — CBC WITH DIFFERENTIAL (CANCER CENTER ONLY)
Abs Immature Granulocytes: 0.01 10*3/uL (ref 0.00–0.07)
Basophils Absolute: 0 10*3/uL (ref 0.0–0.1)
Basophils Relative: 1 %
Eosinophils Absolute: 0 10*3/uL (ref 0.0–0.5)
Eosinophils Relative: 2 %
HCT: 31.2 % — ABNORMAL LOW (ref 39.0–52.0)
Hemoglobin: 10.4 g/dL — ABNORMAL LOW (ref 13.0–17.0)
Immature Granulocytes: 1 %
Lymphocytes Relative: 15 %
Lymphs Abs: 0.3 10*3/uL — ABNORMAL LOW (ref 0.7–4.0)
MCH: 31.3 pg (ref 26.0–34.0)
MCHC: 33.3 g/dL (ref 30.0–36.0)
MCV: 94 fL (ref 80.0–100.0)
Monocytes Absolute: 0.4 10*3/uL (ref 0.1–1.0)
Monocytes Relative: 17 %
Neutro Abs: 1.4 10*3/uL — ABNORMAL LOW (ref 1.7–7.7)
Neutrophils Relative %: 64 %
Platelet Count: 118 10*3/uL — ABNORMAL LOW (ref 150–400)
RBC: 3.32 MIL/uL — ABNORMAL LOW (ref 4.22–5.81)
RDW: 16.3 % — ABNORMAL HIGH (ref 11.5–15.5)
WBC Count: 2.2 10*3/uL — ABNORMAL LOW (ref 4.0–10.5)
nRBC: 0 % (ref 0.0–0.2)

## 2021-02-02 LAB — CMP (CANCER CENTER ONLY)
ALT: 9 U/L (ref 0–44)
AST: 12 U/L — ABNORMAL LOW (ref 15–41)
Albumin: 3.4 g/dL — ABNORMAL LOW (ref 3.5–5.0)
Alkaline Phosphatase: 67 U/L (ref 38–126)
Anion gap: 10 (ref 5–15)
BUN: 32 mg/dL — ABNORMAL HIGH (ref 8–23)
CO2: 27 mmol/L (ref 22–32)
Calcium: 8.5 mg/dL — ABNORMAL LOW (ref 8.9–10.3)
Chloride: 104 mmol/L (ref 98–111)
Creatinine: 2.08 mg/dL — ABNORMAL HIGH (ref 0.61–1.24)
GFR, Estimated: 32 mL/min — ABNORMAL LOW (ref >60)
Glucose, Bld: 93 mg/dL (ref 70–99)
Potassium: 4.1 mmol/L (ref 3.5–5.1)
Sodium: 141 mmol/L (ref 135–145)
Total Bilirubin: 1.2 mg/dL (ref 0.3–1.2)
Total Protein: 6.4 g/dL — ABNORMAL LOW (ref 6.5–8.1)

## 2021-02-02 NOTE — Progress Notes (Signed)
Hematology and Oncology Follow Up Visit  Patrick Bishop [id number] 31-Dec-1940 80 y.o. 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    Prior Therapy:  He is status post TURBT completed on October 21, 2020 which showed high-grade urothelial carcinoma with muscle invasion.  Current therapy: Radiation and weekly carboplatin started on December 24, 2020.  He completed 6 cycles of carboplatin on Jan 27, 2021 and finishing radiation treatment this week.  Interim History: [name] is here for a follow-up visit.  Since the last visit, he reports feeling reasonably fair without any major complaints.  He tolerated therapy so far without any major complaints.  He does report some loose bowel habits and diarrhea and has not reported any nausea, vomiting or abdominal discomfort.  He denies any recent hospitalization or illnesses.     Medications: Updated on review. Current Outpatient Medications  Medication Sig Dispense Refill  . atorvastatin (LIPITOR) 10 MG tablet TAKE 1 TABLET DAILY (Patient taking differently: Take 10 mg by mouth daily.) 90 tablet 3  . ciprofloxacin (CIPRO) 500 MG tablet Take 500 mg by mouth 2 (two) times daily.    [name] HYDROcodone-acetaminophen (NORCO/VICODIN) 5-325 MG tablet Take 1 tablet by mouth 2 (two) times daily as needed for moderate pain. (Patient taking differently: Take 1 tablet by mouth every 6 (six) hours as needed for moderate pain.) 5 tablet 0  . loperamide (IMODIUM A-D) 2 MG tablet Take 2 mg by mouth 4 (four) times daily as needed for diarrhea or loose stools.    . magnesium oxide (MAG-OX) 400 (241.3 Mg) MG tablet Take 1 tablet (400 mg total) by mouth daily.    . metoprolol tartrate (LOPRESSOR) 25 MG tablet Take 25-37.5 mg by mouth See admin instructions. Take 25 mg in the morning and 37.5 mg in the evening    . oxybutynin (DITROPAN) 5  MG tablet Take 1 tablet (5 mg total) by mouth every 8 (eight) hours as needed for bladder spasms. (Patient not taking: No sig reported) 30 tablet 1  . phenazopyridine (PYRIDIUM) 200 MG tablet Take 1 tablet (200 mg total) by mouth 3 (three) times daily as needed (for pain with urination). (Patient not taking: No sig reported) 30 tablet 0  . potassium chloride SA (KLOR-CON) 20 MEQ tablet Take 1 tablet (20 mEq total) by mouth 2 (two) times daily. 180 tablet 3  . prochlorperazine (COMPAZINE) 10 MG tablet TAKE 1 TABLET(10 MG) BY MOUTH EVERY 6 HOURS AS NEEDED FOR NAUSEA OR VOMITING (Patient taking differently: Take 10 mg by mouth every 6 (six) hours as needed for nausea or vomiting.) 30 tablet 0  . tamsulosin (FLOMAX) 0.4 MG CAPS capsule Take 0.4 mg by mouth daily.    [name] torsemide (DEMADEX) 20 MG tablet Take 40-60 mg by mouth See admin instructions. Take 60 mg in the morning, 40 mg in the afternoon    . XARELTO 20 MG TABS tablet TAKE 1 TABLET DAILY WITH SUPPER (Patient taking differently: Take 20 mg by mouth daily with supper.) 90 tablet 3   No current facility-administered medications for this visit.     Allergies:  Allergies  Allergen Reactions  . Aldactone [Spironolactone] Other (See Comments)    unknown       Physical Exam:   ECOG:  2   General appearance: Alert, awake without any distress. Head: Atraumatic without abnormalities Oropharynx: Without any thrush or ulcers. Eyes: No  scleral icterus. Lymph nodes: No lymphadenopathy noted in the cervical, supraclavicular, or axillary nodes Heart:regular rate and rhythm, without any murmurs or gallops.   Lung: Clear to auscultation without any rhonchi, wheezes or dullness to percussion. Abdomin: Soft, nontender without any shifting dullness or ascites. Musculoskeletal: No clubbing or cyanosis. Neurological: No motor or sensory deficits. Skin: No rashes or lesions.     Lab Results: Lab Results  Component Value Date   WBC 2.2 (L)  02/02/2021   HGB 10.4 (L) 02/02/2021   HCT 31.2 (L) 02/02/2021   MCV 94.0 02/02/2021   PLT 118 (L) 02/02/2021     Chemistry      Component Value Date/Time   NA 141 01/27/2021 0851   NA 140 02/21/2020 1123   K 4.8 01/27/2021 0851   CL 108 01/27/2021 0851   CO2 25 01/27/2021 0851   BUN 29 (H) 01/27/2021 0851   BUN 31 (H) 02/21/2020 1123   CREATININE 1.88 (H) 01/27/2021 0851      Component Value Date/Time   CALCIUM 8.6 (L) 01/27/2021 0851   ALKPHOS 69 01/27/2021 0851   AST 17 01/27/2021 0851   ALT 10 01/27/2021 0851   BILITOT 1.1 01/27/2021 0851        Impression and Plan:   80 year old man with:  1.    Bladder cancer diagnosed in January 2022.  He was found to have T2N0 high-grade urothelial carcinoma.  He has not completed 6 cycles of carboplatin and that finishing the last week of radiation today.  His disease status was updated and treatment options for the future were discussed.  The plan is to continued active surveillance upon completing therapy and will update his staging scans with the next visit.  He has no evidence of metastatic disease no additional therapy will be needed at that time.  He has follow-up with urology as well for a repeat cystoscopy.   2.  IV access: Port-A-Cath was not required with peripheral veins currently in use.  3.  Antiemetics: No residual nausea or vomiting reported at this time.  4.  Chronic renal insufficiency: Baseline kidney function remains about the same without any major changes.  5.  Hydronephrosis: Related due to his malignancy and managed with a ureteral stent.  6.  Follow-up: He will return in 2 months for repeat evaluation.   30  minutes were dedicated to this visit.  The time was spent on reviewing laboratory data, disease status update and outlining future plan of care.   Zola Button, MD 5/10/202211:18 AM

## 2021-02-02 NOTE — Telephone Encounter (Signed)
Nutrition Assessment   Reason for Assessment: MST   ASSESSMENT: 80 year old male with bladder cancer. Patient is s/p TURBT on 1/26. Noted recent hospital admission on 4/26 for cytoscopy with stent exchange. He is receiving concurrent chemoradiation therapy with Carboplatin. Patient is followed by Dr. Alen Blew and Dr. Tammi Klippel.  Past medical history of A-fib, chronic dCHF, chronic venous insufficiency, HLD, OSA on CPAP, LE lymphedema, obesity.   Spoke with patient via telephone. Introduced self and services available at [facility]. Patient reports "appetite has not been very strong lately" He has been feeling tired and has had some diarrhea, relieved by Imodium. Yesterday patient recalls a bowl of peaches, jello, chicken with mashed potatoes and tomato cucumber salad, most of a cheeseburger with fries, and a few peanut butter crackers. Patient reports drinking 6-7 cans of soda daily. He reports not drinking enough "regular water" usually 2 (12 oz) bottles. He has been drinking 1 Ensure daily, unsure of which kind.    Medications: Hydrocodone, mag-ox, pyridium, klor-con, demadex, xarelto.    Labs: 5/4 BUN 29, Cr 1.88 Albumin 3.3   Anthropometrics: Patient reports weights fluctuate due to chronic LE lymphedema, takes Demadex (100 mg/day). Usual dry weight around 320-325 lbs  Height: 6'1" Weight: 317.2 lb (per 5/6 radiation weekly treatment check) UBW: 320-325 lbs (per pt) BMI: 41.48   NUTRITION DIAGNOSIS: Inadequate intake related to bladder cancer and related treatments as evidenced by decreased appetite and 2.5% under usual weight which is insignificant   INTERVENTION:  Discussed strategies for poor appetite Educated on small frequent meals and snacks, offered snack ideas, will mail handout Discussed foods with protein, will mail handout Discussed strategies for diarrhea, will mail handout  Continue Imodium as needed Educated on empty calories in soda, encouraged decreased soda intake and  increasing water intake Continue drinking daily Ensure, will mail coupons Provided RD contact information  MONITORING, EVALUATION, GOAL: weight trends, intake   Next Visit: ~3 weeks via telephone

## 2021-02-03 ENCOUNTER — Other Ambulatory Visit: Payer: Self-pay

## 2021-02-03 ENCOUNTER — Ambulatory Visit
Admission: RE | Admit: 2021-02-03 | Discharge: 2021-02-03 | Disposition: A | Discharge status: Home / Self Care | Payer: Medicare Other | Source: Ambulatory Visit | Attending: Radiation Oncology | Admitting: Radiation Oncology

## 2021-02-03 DIAGNOSIS — C679 Malignant neoplasm of bladder, unspecified: Secondary | ICD-10-CM | POA: Diagnosis not present

## 2021-02-03 DIAGNOSIS — Z510 Encounter for antineoplastic radiation therapy: Secondary | ICD-10-CM | POA: Diagnosis not present

## 2021-02-03 DIAGNOSIS — C678 Malignant neoplasm of overlapping sites of bladder: Secondary | ICD-10-CM | POA: Diagnosis not present

## 2021-02-04 ENCOUNTER — Ambulatory Visit
Admission: RE | Admit: 2021-02-04 | Discharge: 2021-02-04 | Disposition: A | Discharge status: Home / Self Care | Payer: Medicare Other | Source: Ambulatory Visit | Attending: Radiation Oncology | Admitting: Radiation Oncology

## 2021-02-04 ENCOUNTER — Ambulatory Visit: Payer: Medicare Other

## 2021-02-04 DIAGNOSIS — C678 Malignant neoplasm of overlapping sites of bladder: Secondary | ICD-10-CM | POA: Diagnosis not present

## 2021-02-04 DIAGNOSIS — Z510 Encounter for antineoplastic radiation therapy: Secondary | ICD-10-CM | POA: Diagnosis not present

## 2021-02-04 DIAGNOSIS — C679 Malignant neoplasm of bladder, unspecified: Secondary | ICD-10-CM | POA: Diagnosis not present

## 2021-02-05 ENCOUNTER — Ambulatory Visit
Admission: RE | Admit: 2021-02-05 | Discharge: 2021-02-05 | Disposition: A | Discharge status: Home / Self Care | Payer: Medicare Other | Source: Ambulatory Visit | Attending: Radiation Oncology | Admitting: Radiation Oncology

## 2021-02-05 ENCOUNTER — Encounter: Payer: Self-pay | Admitting: Urology

## 2021-02-05 ENCOUNTER — Other Ambulatory Visit: Payer: Self-pay

## 2021-02-05 DIAGNOSIS — I714 Abdominal aortic aneurysm, without rupture: Secondary | ICD-10-CM | POA: Diagnosis not present

## 2021-02-05 DIAGNOSIS — I700 Atherosclerosis of aorta: Secondary | ICD-10-CM | POA: Diagnosis not present

## 2021-02-05 DIAGNOSIS — C678 Malignant neoplasm of overlapping sites of bladder: Secondary | ICD-10-CM | POA: Diagnosis not present

## 2021-02-05 DIAGNOSIS — C679 Malignant neoplasm of bladder, unspecified: Secondary | ICD-10-CM | POA: Diagnosis not present

## 2021-02-05 DIAGNOSIS — N184 Chronic kidney disease, stage 4 (severe): Secondary | ICD-10-CM | POA: Diagnosis not present

## 2021-02-05 DIAGNOSIS — I5032 Chronic diastolic (congestive) heart failure: Secondary | ICD-10-CM | POA: Diagnosis not present

## 2021-02-05 DIAGNOSIS — Z0001 Encounter for general adult medical examination with abnormal findings: Secondary | ICD-10-CM | POA: Diagnosis not present

## 2021-02-05 DIAGNOSIS — E7800 Pure hypercholesterolemia, unspecified: Secondary | ICD-10-CM | POA: Diagnosis not present

## 2021-02-05 DIAGNOSIS — Z79899 Other long term (current) drug therapy: Secondary | ICD-10-CM | POA: Diagnosis not present

## 2021-02-05 DIAGNOSIS — Z510 Encounter for antineoplastic radiation therapy: Secondary | ICD-10-CM | POA: Diagnosis not present

## 2021-02-05 DIAGNOSIS — I4811 Longstanding persistent atrial fibrillation: Secondary | ICD-10-CM | POA: Diagnosis not present

## 2021-02-25 ENCOUNTER — Encounter: Payer: Self-pay | Admitting: Oncology

## 2021-02-26 DIAGNOSIS — I739 Peripheral vascular disease, unspecified: Secondary | ICD-10-CM | POA: Diagnosis not present

## 2021-02-26 DIAGNOSIS — Z79899 Other long term (current) drug therapy: Secondary | ICD-10-CM | POA: Diagnosis not present

## 2021-02-26 DIAGNOSIS — G894 Chronic pain syndrome: Secondary | ICD-10-CM | POA: Diagnosis not present

## 2021-02-26 DIAGNOSIS — Z79891 Long term (current) use of opiate analgesic: Secondary | ICD-10-CM | POA: Diagnosis not present

## 2021-02-26 DIAGNOSIS — M1710 Unilateral primary osteoarthritis, unspecified knee: Secondary | ICD-10-CM | POA: Diagnosis not present

## 2021-02-26 DIAGNOSIS — M169 Osteoarthritis of hip, unspecified: Secondary | ICD-10-CM | POA: Diagnosis not present

## 2021-03-25 ENCOUNTER — Encounter: Payer: Self-pay | Admitting: Urology

## 2021-03-25 ENCOUNTER — Ambulatory Visit
Admission: RE | Admit: 2021-03-25 | Discharge: 2021-03-25 | Disposition: A | Discharge status: Home / Self Care | Payer: Medicare Other | Source: Ambulatory Visit | Attending: Urology | Admitting: Urology

## 2021-03-25 ENCOUNTER — Other Ambulatory Visit: Payer: Self-pay

## 2021-03-25 DIAGNOSIS — C670 Malignant neoplasm of trigone of bladder: Secondary | ICD-10-CM

## 2021-03-25 NOTE — Progress Notes (Signed)
Spoke with patient via phone doing well. States he has arthritis and has generalized joint pain. Denies any issues with urination does still have occasional diarrhea but is much better.

## 2021-03-25 NOTE — Progress Notes (Signed)
Radiation Oncology         [phone] ________________________________  Name: Patrick Bishop MRN: [id number]  Date: 03/25/2021  DOB: June 11, 1941  Post Treatment Note  CC: Lujean Amel, MD  [name], MD  Diagnosis:    80 y.o. 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  Interval Since Last Radiation:  6.5 weeks  12/17/20 - 02/05/21: 1. The bladder tumor only was initially boosted to 19.8 Gy with 11 additional fractions of 1.8 Gy with a full bladder set-up 2. The bladder and pelvic lymph nodes were subsequently treated to 45 Gy in 25 fractions of 1.8 Gy with empty bladder carrying the total tumor dose to 64.8 Gy; concurrent with systemic therapy  Narrative:  I spoke with the patient to conduct his routine scheduled 1 month follow up visit via telephone to spare the patient unnecessary potential exposure in the healthcare setting during the current COVID-19 pandemic.  The patient was notified in advance and gave permission to proceed with this visit format. He tolerated radiation treatment relatively well with only minor urinary irritation and modest fatigue.                              On review of systems, the patient states that he is doing well in general. He continues with occasional loose stools.diarrhea but feels that this continues to improve. He denies any change in his baseline LUTS with chronic incontinence but no gross hematuria, dysuria, fever or chills. He denies abdominal pain, N/V or constipation. He had a recent cystoscopy procedure with Dr. Lovena Neighbours on 01/19/21 with left ureteral stent exchange. Overall, he is pleased with his progress to date.   ALLERGIES:  is allergic to aldactone [spironolactone].  Meds: Current Outpatient Medications  Medication Sig Dispense Refill   atorvastatin (LIPITOR) 10 MG tablet TAKE 1 TABLET DAILY (Patient taking differently: Take 10 mg by mouth daily.) 90 tablet 3   HYDROcodone-acetaminophen  (NORCO/VICODIN) 5-325 MG tablet Take 1 tablet by mouth 2 (two) times daily as needed for moderate pain. (Patient taking differently: Take 1 tablet by mouth every 6 (six) hours as needed for moderate pain.) 5 tablet 0   magnesium oxide (MAG-OX) 400 (241.3 Mg) MG tablet Take 1 tablet (400 mg total) by mouth daily.     metoprolol tartrate (LOPRESSOR) 25 MG tablet Take 25-37.5 mg by mouth See admin instructions. Take 25 mg in the morning and 37.5 mg in the evening     oxybutynin (DITROPAN) 5 MG tablet Take 1 tablet (5 mg total) by mouth every 8 (eight) hours as needed for bladder spasms. 30 tablet 1   potassium chloride SA (KLOR-CON) 20 MEQ tablet Take 1 tablet (20 mEq total) by mouth 2 (two) times daily. 180 tablet 3   tamsulosin (FLOMAX) 0.4 MG CAPS capsule Take 0.4 mg by mouth daily.     XARELTO 20 MG TABS tablet TAKE 1 TABLET DAILY WITH SUPPER (Patient taking differently: Take 20 mg by mouth daily with supper.) 90 tablet 3   ciprofloxacin (CIPRO) 500 MG tablet Take 500 mg by mouth 2 (two) times daily.     loperamide (IMODIUM A-D) 2 MG tablet Take 2 mg by mouth 4 (four) times daily as needed for diarrhea or loose stools. (Patient not taking: Reported on 03/25/2021)     phenazopyridine (PYRIDIUM) 200 MG tablet Take 1 tablet (200 mg total) by mouth 3 (three) times daily as needed (for  pain with urination). (Patient not taking: No sig reported) 30 tablet 0   prochlorperazine (COMPAZINE) 10 MG tablet TAKE 1 TABLET(10 MG) BY MOUTH EVERY 6 HOURS AS NEEDED FOR NAUSEA OR VOMITING (Patient not taking: Reported on 03/25/2021) 30 tablet 0   torsemide (DEMADEX) 20 MG tablet Take 40-60 mg by mouth See admin instructions. Take 60 mg in the morning, 40 mg in the afternoon (Patient not taking: Reported on 03/25/2021)     No current facility-administered medications for this encounter.    Physical Findings:  vitals were not taken for this visit.  Pain Assessment Pain Score: 4  Pain Frequency: Constant Pain Loc:  Generalized/ Unable to assess due to telephone follow up visit format.  Lab Findings: Lab Results  Component Value Date   WBC 2.2 (L) 02/02/2021   HGB 10.4 (L) 02/02/2021   HCT 31.2 (L) 02/02/2021   MCV 94.0 02/02/2021   PLT 118 (L) 02/02/2021     Radiographic Findings: No results found.  Impression/Plan: 1.  80 y.o. 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 He appears to have recovered well from the effects of his recent radiation and is currently without complaints.  He has upcoming follow-up appointments with Dr. Alen Blew for repeat systemic imaging on 04/06/2021 and will see him in the office on 04/08/2021 to review results and recommendations.  He is also scheduled for follow-up with Dr. Lovena Neighbours at [facility] urology on 04/22/2021.  We discussed that while we are happy to continue to participate in his care if clinically indicated, at this point, we will plan to see him back on an as-needed basis.  He knows that he is welcome to call at anytime with any questions or concerns related to his previous radiotherapy.    Nicholos Johns, PA-C

## 2021-03-25 NOTE — Progress Notes (Signed)
  Radiation Oncology         [phone] ________________________________  Name: Patrick Bishop MRN: [id number]  Date: 02/05/2021  DOB: September 09, 1941  End of Treatment Note  Diagnosis:   80 y.o. 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     Indication for treatment:  Curative, Definitive Radiotherapy       Radiation treatment dates:   12/17/20 - 02/05/21  Site/dose:  1. The bladder tumor only was initially boosted to 19.8 Gy with 11 additional fractions of 1.8 Gy with a full bladder set-up  2. The bladder and pelvic lymph nodes were subsequently treated to 45 Gy in 25 fractions of 1.8 Gy with empty bladder carrying the total tumor dose to 64.8 Gy  Beams/energy:  1. The bladder tumor only was boosted using 3D 15 megavolt photons. Image guidance was performed with CB-CT studies prior to each fraction. The patient was immobilized with a body fix lower extremity mold. 2. The bladder and pelvic lymph nodes were subsequently treated using 3D radiotherapy delivering 15 megavolt photons. Image guidance was performed with CB-CT studies prior to each fraction. The patient was immobilized with a body fix lower extremity mold.   Narrative: The patient tolerated radiation treatment relatively well.   The patient experienced some minor urinary irritation and modest fatigue.    Plan: The patient has completed radiation treatment. He will return to radiation oncology clinic for routine followup in one month. I advised him to call or return sooner if he has any questions or concerns related to his recovery or treatment. ________________________________  Sheral Apley. Tammi Klippel, M.D.

## 2021-03-26 DIAGNOSIS — M1710 Unilateral primary osteoarthritis, unspecified knee: Secondary | ICD-10-CM | POA: Diagnosis not present

## 2021-03-26 DIAGNOSIS — M169 Osteoarthritis of hip, unspecified: Secondary | ICD-10-CM | POA: Diagnosis not present

## 2021-03-26 DIAGNOSIS — M79673 Pain in unspecified foot: Secondary | ICD-10-CM | POA: Diagnosis not present

## 2021-03-26 DIAGNOSIS — G894 Chronic pain syndrome: Secondary | ICD-10-CM | POA: Diagnosis not present

## 2021-04-06 ENCOUNTER — Inpatient Hospital Stay: Payer: Medicare Other | Attending: Oncology

## 2021-04-06 ENCOUNTER — Encounter: Payer: Self-pay | Admitting: Oncology

## 2021-04-06 ENCOUNTER — Other Ambulatory Visit: Payer: Self-pay

## 2021-04-06 ENCOUNTER — Ambulatory Visit (HOSPITAL_COMMUNITY)
Admission: RE | Admit: 2021-04-06 | Discharge: 2021-04-06 | Disposition: A | Discharge status: Home / Self Care | Payer: Medicare Other | Source: Ambulatory Visit | Attending: Oncology | Admitting: Oncology

## 2021-04-06 DIAGNOSIS — C679 Malignant neoplasm of bladder, unspecified: Secondary | ICD-10-CM | POA: Diagnosis not present

## 2021-04-06 DIAGNOSIS — N189 Chronic kidney disease, unspecified: Secondary | ICD-10-CM | POA: Insufficient documentation

## 2021-04-06 DIAGNOSIS — C670 Malignant neoplasm of trigone of bladder: Secondary | ICD-10-CM | POA: Diagnosis not present

## 2021-04-06 DIAGNOSIS — I714 Abdominal aortic aneurysm, without rupture: Secondary | ICD-10-CM | POA: Diagnosis not present

## 2021-04-06 DIAGNOSIS — Z79899 Other long term (current) drug therapy: Secondary | ICD-10-CM | POA: Insufficient documentation

## 2021-04-06 DIAGNOSIS — N1330 Unspecified hydronephrosis: Secondary | ICD-10-CM | POA: Insufficient documentation

## 2021-04-06 DIAGNOSIS — K8020 Calculus of gallbladder without cholecystitis without obstruction: Secondary | ICD-10-CM | POA: Diagnosis not present

## 2021-04-06 LAB — CMP (CANCER CENTER ONLY)
ALT: 6 U/L (ref 0–44)
AST: 10 U/L — ABNORMAL LOW (ref 15–41)
Albumin: 3.1 g/dL — ABNORMAL LOW (ref 3.5–5.0)
Alkaline Phosphatase: 75 U/L (ref 38–126)
Anion gap: 9 (ref 5–15)
BUN: 27 mg/dL — ABNORMAL HIGH (ref 8–23)
CO2: 28 mmol/L (ref 22–32)
Calcium: 9.2 mg/dL (ref 8.9–10.3)
Chloride: 105 mmol/L (ref 98–111)
Creatinine: 2.03 mg/dL — ABNORMAL HIGH (ref 0.61–1.24)
GFR, Estimated: 33 mL/min — ABNORMAL LOW (ref >60)
Glucose, Bld: 108 mg/dL — ABNORMAL HIGH (ref 70–99)
Potassium: 3.9 mmol/L (ref 3.5–5.1)
Sodium: 142 mmol/L (ref 135–145)
Total Bilirubin: 0.8 mg/dL (ref 0.3–1.2)
Total Protein: 7.1 g/dL (ref 6.5–8.1)

## 2021-04-06 LAB — CBC WITH DIFFERENTIAL (CANCER CENTER ONLY)
Abs Immature Granulocytes: 0.01 10*3/uL (ref 0.00–0.07)
Basophils Absolute: 0 10*3/uL (ref 0.0–0.1)
Basophils Relative: 0 %
Eosinophils Absolute: 0.1 10*3/uL (ref 0.0–0.5)
Eosinophils Relative: 2 %
HCT: 27.1 % — ABNORMAL LOW (ref 39.0–52.0)
Hemoglobin: 9 g/dL — ABNORMAL LOW (ref 13.0–17.0)
Immature Granulocytes: 0 %
Lymphocytes Relative: 6 %
Lymphs Abs: 0.3 10*3/uL — ABNORMAL LOW (ref 0.7–4.0)
MCH: 32.6 pg (ref 26.0–34.0)
MCHC: 33.2 g/dL (ref 30.0–36.0)
MCV: 98.2 fL (ref 80.0–100.0)
Monocytes Absolute: 0.5 10*3/uL (ref 0.1–1.0)
Monocytes Relative: 11 %
Neutro Abs: 4.2 10*3/uL (ref 1.7–7.7)
Neutrophils Relative %: 81 %
Platelet Count: 178 10*3/uL (ref 150–400)
RBC: 2.76 MIL/uL — ABNORMAL LOW (ref 4.22–5.81)
RDW: 13.8 % (ref 11.5–15.5)
WBC Count: 5.1 10*3/uL (ref 4.0–10.5)
nRBC: 0 % (ref 0.0–0.2)

## 2021-04-08 ENCOUNTER — Other Ambulatory Visit: Payer: Self-pay

## 2021-04-08 ENCOUNTER — Inpatient Hospital Stay (HOSPITAL_BASED_OUTPATIENT_CLINIC_OR_DEPARTMENT_OTHER): Payer: Medicare Other | Admitting: Oncology

## 2021-04-08 VITALS — BP 101/64 | HR 50 | Temp 98.1°F | Resp 17 | Wt 313.1 lb

## 2021-04-08 DIAGNOSIS — C670 Malignant neoplasm of trigone of bladder: Secondary | ICD-10-CM | POA: Diagnosis not present

## 2021-04-08 DIAGNOSIS — N189 Chronic kidney disease, unspecified: Secondary | ICD-10-CM | POA: Diagnosis not present

## 2021-04-08 DIAGNOSIS — C679 Malignant neoplasm of bladder, unspecified: Secondary | ICD-10-CM | POA: Diagnosis not present

## 2021-04-08 DIAGNOSIS — N1330 Unspecified hydronephrosis: Secondary | ICD-10-CM | POA: Diagnosis not present

## 2021-04-08 DIAGNOSIS — Z79899 Other long term (current) drug therapy: Secondary | ICD-10-CM | POA: Diagnosis not present

## 2021-04-08 NOTE — Progress Notes (Signed)
Hematology and Oncology Follow Up Visit  Patrick Bishop [id number] Jan 01, 1941 80 y.o. 04/08/2021 8:49 AM Bishop, Patrick, MDKoirala, [name], MD   Principle Diagnosis: 80 year old man with bladder cancer diagnosed in January 2022.  He presented with T2N0 high-grade urothelial carcinoma without any evidence of metastatic disease.   Prior Therapy:  He is status post TURBT completed on October 21, 2020 which showed high-grade urothelial carcinoma with muscle invasion.  Radiation and weekly carboplatin started on December 24, 2020.  He completed 6 cycles of carboplatin on Jan 27, 2021.    Current therapy: Active surveillance.  Interim History: [name] presents today for return evaluation.  Since the last visit, he reports no major changes in his health.  He has tolerated definitive treatment for his bladder cancer without any residual complications.  He denies any nausea, vomiting or abdominal pain.  He denies any diarrhea, frequency or urgency.  He denies any recent hospitalization or illnesses.  His performance status is overall limited but unchanged overall.     Medications: Unchanged on review. Current Outpatient Medications  Medication Sig Dispense Refill   atorvastatin (LIPITOR) 10 MG tablet TAKE 1 TABLET DAILY (Patient taking differently: Take 10 mg by mouth daily.) 90 tablet 3   ciprofloxacin (CIPRO) 500 MG tablet Take 500 mg by mouth 2 (two) times daily.     HYDROcodone-acetaminophen (NORCO/VICODIN) 5-325 MG tablet Take 1 tablet by mouth 2 (two) times daily as needed for moderate pain. (Patient taking differently: Take 1 tablet by mouth every 6 (six) hours as needed for moderate pain.) 5 tablet 0   loperamide (IMODIUM A-D) 2 MG tablet Take 2 mg by mouth 4 (four) times daily as needed for diarrhea or loose stools. (Patient not taking: Reported on 03/25/2021)     magnesium oxide (MAG-OX) 400 (241.3 Mg) MG tablet Take 1 tablet (400 mg total) by mouth daily.     metoprolol tartrate  (LOPRESSOR) 25 MG tablet Take 25-37.5 mg by mouth See admin instructions. Take 25 mg in the morning and 37.5 mg in the evening     oxybutynin (DITROPAN) 5 MG tablet Take 1 tablet (5 mg total) by mouth every 8 (eight) hours as needed for bladder spasms. 30 tablet 1   phenazopyridine (PYRIDIUM) 200 MG tablet Take 1 tablet (200 mg total) by mouth 3 (three) times daily as needed (for pain with urination). (Patient not taking: No sig reported) 30 tablet 0   potassium chloride SA (KLOR-CON) 20 MEQ tablet Take 1 tablet (20 mEq total) by mouth 2 (two) times daily. 180 tablet 3   prochlorperazine (COMPAZINE) 10 MG tablet TAKE 1 TABLET(10 MG) BY MOUTH EVERY 6 HOURS AS NEEDED FOR NAUSEA OR VOMITING (Patient not taking: Reported on 03/25/2021) 30 tablet 0   tamsulosin (FLOMAX) 0.4 MG CAPS capsule Take 0.4 mg by mouth daily.     torsemide (DEMADEX) 20 MG tablet Take 40-60 mg by mouth See admin instructions. Take 60 mg in the morning, 40 mg in the afternoon (Patient not taking: Reported on 03/25/2021)     XARELTO 20 MG TABS tablet TAKE 1 TABLET DAILY WITH SUPPER (Patient taking differently: Take 20 mg by mouth daily with supper.) 90 tablet 3   No current facility-administered medications for this visit.     Allergies:  Allergies  Allergen Reactions   Aldactone [Spironolactone] Other (See Comments)    unknown       Physical Exam:   ECOG:  2  General appearance: Comfortable appearing without any discomfort Head: Normocephalic without  any trauma Oropharynx: Mucous membranes are moist and pink without any thrush or ulcers. Eyes: Pupils are equal and round reactive to light. Lymph nodes: No cervical, supraclavicular, inguinal or axillary lymphadenopathy.   Heart:regular rate and rhythm.  S1 and S2 without leg edema. Lung: Clear without any rhonchi or wheezes.  No dullness to percussion. Abdomin: Soft, nontender, nondistended with good bowel sounds.  No hepatosplenomegaly. Musculoskeletal: No joint  deformity or effusion.  Full range of motion noted. Neurological: No deficits noted on motor, sensory and deep tendon reflex exam. Skin: No petechial rash or dryness.  Appeared moist.     Lab Results: Lab Results  Component Value Date   WBC 5.1 04/06/2021   HGB 9.0 (L) 04/06/2021   HCT 27.1 (L) 04/06/2021   MCV 98.2 04/06/2021   PLT 178 04/06/2021     Chemistry      Component Value Date/Time   NA 142 04/06/2021 0945   NA 140 02/21/2020 1123   K 3.9 04/06/2021 0945   CL 105 04/06/2021 0945   CO2 28 04/06/2021 0945   BUN 27 (H) 04/06/2021 0945   BUN 31 (H) 02/21/2020 1123   CREATININE 2.03 (H) 04/06/2021 0945      Component Value Date/Time   CALCIUM 9.2 04/06/2021 0945   ALKPHOS 75 04/06/2021 0945   AST 10 (L) 04/06/2021 0945   ALT 6 04/06/2021 0945   BILITOT 0.8 04/06/2021 0945     IMPRESSION: 1. Soft tissue fullness noted in the region of the left UVJ today but bladder wall thickening noted previously in this region not well demonstrated on today's noncontrast study. 2. Left internal ureteral stent in situ with resolution of left hydronephrosis. 3. No findings to suggest metastatic disease in the abdomen/pelvis on today's noncontrast study. 4. Cholelithiasis. 5. Hepatic steatosis. 6. Tiny right lower lobe pulmonary nodules. Continued attention on follow-up recommended. 7. 3.5 cm abdominal aortic aneurysm. Recommend follow-up ultrasound every 2 years. This recommendation follows ACR consensus guidelines: White Paper of the ACR Incidental Findings Committee II on Vascular Findings. J Am Coll Radiol 2013; 10:789-794. 8. Fatty posterolateral left chest wall lesion again noted, incompletely visualized. 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 This was better seen on chest CT of 06/24/2019 9. Aortic Atherosclerosis (ICD10-I70.0).     Impression and Plan:   80 year old man with:   1.   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  His  disease status post discussed today and treatment options were reviewed.  CT scan October 07, 2020 did not show evidence of metastatic disease.  At this time they did not recommend any additional treatment and recommended continuing active surveillance with a repeat cystoscopy and imaging studies periodically.  Salvage therapy with immunotherapy will be used if he developed advanced disease in the future.  He understands this plan and has a follow-up with urology in the near future for a repeat cystoscopy.       2.  Chronic renal insufficiency: Kidney function remained stable after treatment with creatinine clearance at baseline.  3.  Hydronephrosis: Improved after treatment based on imaging studies.   4.  Follow-up: In 6 months for repeat follow-up.   30  minutes were spent on this encounter.  The time was dedicated to reviewing imaging studies, treatment choices and outlining future plan of care.    Zola Button, MD 7/14/20228:49 AM

## 2021-04-22 DIAGNOSIS — N130 Hydronephrosis with ureteropelvic junction obstruction: Secondary | ICD-10-CM | POA: Diagnosis not present

## 2021-04-22 DIAGNOSIS — C670 Malignant neoplasm of trigone of bladder: Secondary | ICD-10-CM | POA: Diagnosis not present

## 2021-04-23 ENCOUNTER — Telehealth: Payer: Self-pay | Admitting: Cardiology

## 2021-04-23 DIAGNOSIS — M79673 Pain in unspecified foot: Secondary | ICD-10-CM | POA: Diagnosis not present

## 2021-04-23 DIAGNOSIS — M1710 Unilateral primary osteoarthritis, unspecified knee: Secondary | ICD-10-CM | POA: Diagnosis not present

## 2021-04-23 DIAGNOSIS — G894 Chronic pain syndrome: Secondary | ICD-10-CM | POA: Diagnosis not present

## 2021-04-23 DIAGNOSIS — M169 Osteoarthritis of hip, unspecified: Secondary | ICD-10-CM | POA: Diagnosis not present

## 2021-04-23 NOTE — Telephone Encounter (Signed)
        [facility] Pre-operative Risk Assessment    Patient Name: Patrick Bishop  DOB: 12/14/1940 MRN: [id number]  HEARTCARE STAFF:  - IMPORTANT!!!!!! Under Visit Info/Reason for Call, type in Other and utilize the format Clearance MM/DD/YY or Clearance TBD. Do not use dashes or single digits. - Please review there is not already an duplicate clearance open for this procedure. - If request is for dental extraction, please clarify the # of teeth to be extracted. - If the patient is currently at the dentist's office, call Pre-Op Callback Staff (MA/nurse) to input urgent request.  - If the patient is not currently in the dentist office, please route to the Pre-Op pool.  Request for surgical clearance:  What type of surgery is being performed? Bladder tumor remove and bladder biopsy  When is this surgery scheduled? TBD  What type of clearance is required (medical clearance vs. Pharmacy clearance to hold med vs. Both)? Both  Are there any medications that need to be held prior to surgery and how long? Xarelto held 72 hours prior surgery  Practice name and name of physician performing surgery? Dr. Ellison Hughs - [facility]  What is the office phone number? [phone] ext. [phone]   7.   What is the office fax number? [phone]  8.   Anesthesia type (None, local, MAC, general) ? General    Angeline S Hammer 04/23/2021, 4:33 PM  _________________________________________________________________   (provider comments below)

## 2021-04-26 NOTE — Telephone Encounter (Signed)
Patient with diagnosis of atrial fibrillation on Xarelto for anticoagulation.    Procedure: bladder tumor removal and biopsy Date of procedure: TBD   CHA2DS2-VASc Score = 5  This indicates a 7.2% annual risk of stroke. The patient's score is based upon: CHF History: Yes HTN History: Yes Diabetes History: No Stroke History: No Vascular Disease History: Yes Age Score: 2 Gender Score: 0   CrCl 43 (with adjusted body weight Platelet count 178  Per office protocol, patient can hold Xarelto for 3 days prior to procedure.   Patient will not need bridging with Lovenox (enoxaparin) around procedure.

## 2021-04-26 NOTE — Telephone Encounter (Signed)
Left message on the home phone for the patient to call back and speak to the on-call preop APP of the day.  Attempted to call cell phone number as well, it was automatically dropped for some reason.

## 2021-04-27 NOTE — Telephone Encounter (Signed)
   Name: Patrick Bishop  DOB: August 31, 1941  MRN: [id number]   Primary Cardiologist: Glenetta Hew, MD  Chart reviewed as part of pre-operative protocol coverage. Patient was contacted 04/27/2021 in reference to pre-operative risk assessment for pending surgery as outlined below.  Patrick Bishop was last seen on 12/30/2020 by Dr. Ellyn Hack.  Since that day, Patrick Bishop has done well without any chest pain or worsening dyspnea.  Patient was previously cleared for a similar procedure in January 2022.  Given lack of any clinical changes, he is cleared to proceed with urology procedure.  As recommended by our clinical pharmacist, he will need to hold his Xarelto for 3 days prior to the surgery and restart it as soon as possible afterward at the surgeon's discretion.  Therefore, based on ACC/AHA guidelines, the patient would be at acceptable risk for the planned procedure without further cardiovascular testing.   The patient was advised that if he develops new symptoms prior to surgery to contact our office to arrange for a follow-up visit, and he verbalized understanding.  I will route this recommendation to the requesting party via Epic fax function and remove from pre-op pool. Please call with questions.  [place], [place] 04/27/2021, 11:05 AM

## 2021-05-06 ENCOUNTER — Other Ambulatory Visit: Payer: Self-pay | Admitting: Urology

## 2021-05-10 NOTE — Progress Notes (Addendum)
COVID swab appointment: N/A  COVID Vaccine Completed: Yes x4 Date COVID Vaccine completed:  10-28-19, 11-25-19 Has received booster: Yes x2 COVID vaccine manufacturer:     Moderna      Date of COVID positive in last 90 days: No  PCP - [name] [name], MD, has not seen recently, scheduled to see him next week Cardiologist - Glenetta Hew, MD  Cardiac clearance in Epic dated 04-27-21 by Almyra Deforest, PA  Chest x-ray - N/A EKG - 12-30-20 Epic Stress Test - greater than 2 years ECHO - 06-25-19 Epic Cardiac Cath - greater than 2 years Pacemaker/ICD device last checked: Spinal Cord Stimulator:  Sleep Study - Yes, +sleep apnea CPAP - No stopped using two years ago  Fasting Blood Sugar - N/A Checks Blood Sugar _____ times a day  Blood Thinner Instructions:  Xarelto 20 mg .  Hold x3 days.  Last dose 05-08-21 Aspirin Instructions: Last Dose:  Activity level:  Unable to go up stairs due to leg pain and knee pain.  Denies chest pain or SOB with activity and able to perform ADLs independently.      Anesthesia review: Afib, CHF, HTN, AAA, pulmonary HTN, OSA,.  Venous insufficiency with bilateral leg and feet swelling with small sores noted on bilateral legs.  No drainage noted from sores.    Patient denies shortness of breath, fever, cough and chest pain at PAT appointment   Patient verbalized understanding of instructions that were given to them at the PAT appointment. Patient was also instructed that they will need to review over the PAT instructions again at home before surgery.

## 2021-05-10 NOTE — Patient Instructions (Addendum)
DUE TO COVID-19 ONLY ONE VISITOR IS ALLOWED TO COME WITH YOU AND STAY IN THE WAITING ROOM ONLY DURING PRE OP AND PROCEDURE.   **NO VISITORS ARE ALLOWED IN THE SHORT STAY AREA OR RECOVERY ROOM!!**         Your procedure is scheduled on: Wednesday, 05-12-21   Report to [facility] Main  Entrance    Report to admitting at 9:45 AM   Call this number if you have problems the morning of surgery [phone]   Do not eat food :After Midnight.   May have liquids until  8:45 AM day of surgery  CLEAR LIQUID DIET  Foods Allowed                                                                     Foods Excluded  Water, Black Coffee and tea, regular and decaf              liquids that you cannot  Plain Jell-O in any flavor  (No red)                                    see through such as: Fruit ices (not with fruit pulp)                                      milk, soups, orange juice              Iced Popsicles (No red)                                      All solid food                                   Apple juices Sports drinks like Gatorade (No red) Lightly seasoned clear broth or consume(fat free) Sugar, honey syrup       Oral Hygiene is also important to reduce your risk of infection.                                    Remember - BRUSH YOUR TEETH THE MORNING OF SURGERY WITH YOUR REGULAR TOOTHPASTE   Do NOT smoke after Midnight  Take these medicines the morning of surgery with A SIP OF WATER:  Atorvastatin, Metoprolol, Tamsulosin,  and Hydrocodone   Hold Xarelto three days prior to surgery.                              You may not have any metal on your body including  jewelry, and body piercing             Do not wear  lotions, powders, cologne, or deodorant              Men may shave face and neck.  Do not bring valuables to the hospital. [place].   Contacts, dentures or bridgework may not be worn into surgery.   Bring CPAP  mask and tubing day of surgery.    Patients discharged the day of surgery will not be allowed to drive home.   Please read over the following fact sheets you were given: IF YOU HAVE QUESTIONS ABOUT YOUR PRE OP INSTRUCTIONS PLEASE CALL [facility] - Preparing for Surgery Before surgery, you can play an important role.  Because skin is not sterile, your skin needs to be as free of germs as possible.  You can reduce the number of germs on your skin by washing with CHG (chlorahexidine gluconate) soap before surgery.  CHG is an antiseptic cleaner which kills germs and bonds with the skin to continue killing germs even after washing. Please DO NOT use if you have an allergy to CHG or antibacterial soaps.  If your skin becomes reddened/irritated stop using the CHG and inform your nurse when you arrive at Short Stay. Do not shave (including legs and underarms) for at least 48 hours prior to the first CHG shower.  You may shave your face/neck.  Please follow these instructions carefully:  1.  Shower with CHG Soap the night before surgery and the  morning of surgery.  2.  If you choose to wash your hair, wash your hair first as usual with your normal  shampoo.  3.  After you shampoo, rinse your hair and body thoroughly to remove the shampoo.                             4.  Use CHG as you would any other liquid soap.  You can apply chg directly to the skin and wash.  Gently with a scrungie or clean washcloth.  5.  Apply the CHG Soap to your body ONLY FROM THE NECK DOWN.   Do   not use on face/ open                           Wound or open sores. Avoid contact with eyes, ears mouth and   genitals (private parts).                       Wash face,  Genitals (private parts) with your normal soap.             6.  Wash thoroughly, paying special attention to the area where your    surgery  will be performed.  7.  Thoroughly rinse your body with warm water from the neck down.  8.  DO NOT  shower/wash with your normal soap after using and rinsing off the CHG Soap.                9.  Pat yourself dry with a clean towel.            10.  Wear clean pajamas.            11.  Place clean sheets on your bed the night of your first shower and do not  sleep with pets. Day of Surgery : Do not apply any lotions/deodorants the morning of surgery.  Please wear clean clothes to the hospital/surgery center.  FAILURE TO FOLLOW THESE INSTRUCTIONS MAY RESULT IN THE CANCELLATION OF  YOUR SURGERY  PATIENT SIGNATURE_________________________________  NURSE SIGNATURE__________________________________  ________________________________________________________________________

## 2021-05-11 ENCOUNTER — Encounter (HOSPITAL_COMMUNITY)
Admission: RE | Admit: 2021-05-11 | Discharge: 2021-05-11 | Disposition: A | Discharge status: Home / Self Care | Payer: Medicare Other | Source: Ambulatory Visit | Attending: Urology | Admitting: Urology

## 2021-05-11 ENCOUNTER — Other Ambulatory Visit: Payer: Self-pay

## 2021-05-11 ENCOUNTER — Encounter (HOSPITAL_COMMUNITY): Payer: Self-pay

## 2021-05-11 DIAGNOSIS — C679 Malignant neoplasm of bladder, unspecified: Secondary | ICD-10-CM | POA: Insufficient documentation

## 2021-05-11 DIAGNOSIS — I130 Hypertensive heart and chronic kidney disease with heart failure and stage 1 through stage 4 chronic kidney disease, or unspecified chronic kidney disease: Secondary | ICD-10-CM | POA: Diagnosis not present

## 2021-05-11 DIAGNOSIS — N135 Crossing vessel and stricture of ureter without hydronephrosis: Secondary | ICD-10-CM | POA: Insufficient documentation

## 2021-05-11 DIAGNOSIS — R7303 Prediabetes: Secondary | ICD-10-CM | POA: Diagnosis not present

## 2021-05-11 DIAGNOSIS — Z7901 Long term (current) use of anticoagulants: Secondary | ICD-10-CM | POA: Diagnosis not present

## 2021-05-11 DIAGNOSIS — G4733 Obstructive sleep apnea (adult) (pediatric): Secondary | ICD-10-CM | POA: Insufficient documentation

## 2021-05-11 DIAGNOSIS — N183 Chronic kidney disease, stage 3 unspecified: Secondary | ICD-10-CM | POA: Diagnosis not present

## 2021-05-11 DIAGNOSIS — I4819 Other persistent atrial fibrillation: Secondary | ICD-10-CM | POA: Diagnosis not present

## 2021-05-11 DIAGNOSIS — I5032 Chronic diastolic (congestive) heart failure: Secondary | ICD-10-CM | POA: Diagnosis not present

## 2021-05-11 DIAGNOSIS — Z79899 Other long term (current) drug therapy: Secondary | ICD-10-CM | POA: Diagnosis not present

## 2021-05-11 DIAGNOSIS — Z01812 Encounter for preprocedural laboratory examination: Secondary | ICD-10-CM | POA: Insufficient documentation

## 2021-05-11 LAB — CBC
HCT: 30.4 % — ABNORMAL LOW (ref 39.0–52.0)
Hemoglobin: 9.5 g/dL — ABNORMAL LOW (ref 13.0–17.0)
MCH: 31.3 pg (ref 26.0–34.0)
MCHC: 31.3 g/dL (ref 30.0–36.0)
MCV: 100 fL (ref 80.0–100.0)
Platelets: 151 10*3/uL (ref 150–400)
RBC: 3.04 MIL/uL — ABNORMAL LOW (ref 4.22–5.81)
RDW: 14.6 % (ref 11.5–15.5)
WBC: 3.2 10*3/uL — ABNORMAL LOW (ref 4.0–10.5)
nRBC: 0 % (ref 0.0–0.2)

## 2021-05-11 LAB — BASIC METABOLIC PANEL
Anion gap: 7 (ref 5–15)
BUN: 25 mg/dL — ABNORMAL HIGH (ref 8–23)
CO2: 25 mmol/L (ref 22–32)
Calcium: 8.5 mg/dL — ABNORMAL LOW (ref 8.9–10.3)
Chloride: 110 mmol/L (ref 98–111)
Creatinine, Ser: 1.69 mg/dL — ABNORMAL HIGH (ref 0.61–1.24)
GFR, Estimated: 41 mL/min — ABNORMAL LOW (ref >60)
Glucose, Bld: 109 mg/dL — ABNORMAL HIGH (ref 70–99)
Potassium: 3.9 mmol/L (ref 3.5–5.1)
Sodium: 142 mmol/L (ref 135–145)

## 2021-05-11 NOTE — Progress Notes (Signed)
Anesthesia Chart Review:   Case: [id number] Date/Time: 05/12/21 1130   Procedure: CYSTOSCOPY WITH LEFT STENT ESCHANGE/ POSSIBLE BLADDER BIOPSY/ TRANSURETHRAL RESECTIONOF BLADDER TUMOR - ONLY NEEDS 30 MIN   Anesthesia type: General   Pre-op diagnosis: LEFT URETERAL OBSTRUCTION, BLADDER CANCER   Location: [facility]   Surgeons: Ceasar Mons, MD       DISCUSSION: Pt is 80 years old with hx chronic diastolic HF, persistent afib, pulmonary hypertension, HTN, chronic venous insufficiency, s/p B iliac and femoral stents, CKD (stage 3), pre-diabetes, OSA, AAA (3.5cm by 04/06/21 CT), bladder cancer (dx 09/2020 - undergoing chemo and radiation)  Pt to hold xarelto x 3 days before surgery  Asked to see pt in presurgical testing for edematous BLE and numerous open wounds on RLE. I am not able to see all wounds due to clothing limitations. The ulcerations I am able to see are not draining and do not appear to be infected. No warmth or tenderness. Skin of RLE is thickened and red due to pigment changes associated with chronic venous insufficiency. Pt reports no new changes to BLE and that Dr. Lovena Neighbours is aware of the condition of his legs. I asked pt to f/u with PCP regarding potential treatments - pt has appointment next week.   - Hgb 9.5. This is improved from hgb 9.0 on 04/06/21. Dr. Alen Blew is aware of 04/06/21 results and has not ordered treatment.    VS: BP (!) 112/56   Pulse 76   Temp 36.9 C (Oral)   Resp 20   Ht 6\' 1"  (1.854 m)   Wt (!) 140.6 kg   SpO2 98%   BMI 40.90 kg/m   PROVIDERS: - PCP is Lujean Amel, MD - Cardiologist Glenetta Hew, MD. Last office visit 12/30/20. Cleared for procedure at acceptable risk by Almyra Deforest, PA on 04/27/21 - Oncologist is Zola Button, MD  LABS:  - Hgb 9.5. This is improving. Pt finished chemo in May. Hgb was 12.0 at time of cancer diagnosis in January and has steadily trended down to low of 9.0 on 04/06/21.    - Cr 1.69. this is  improved from prior results dating back to 01/13/21 ranging 1.88-2.08  (all labs ordered are listed, but only abnormal results are displayed)  Labs Reviewed  BASIC METABOLIC PANEL - Abnormal; Notable for the following components:      Result Value   Glucose, Bld 109 (*)    BUN 25 (*)    Creatinine, Ser 1.69 (*)    Calcium 8.5 (*)    GFR, Estimated 41 (*)    All other components within normal limits  CBC - Abnormal; Notable for the following components:   WBC 3.2 (*)    RBC 3.04 (*)    Hemoglobin 9.5 (*)    HCT 30.4 (*)    All other components within normal limits    IMAGES: CT abd pelvis 04/06/21:  1. Soft tissue fullness noted in the region of the left UVJ today but bladder wall thickening noted previously in this region not well demonstrated on today's noncontrast study. 2. Left internal ureteral stent in situ with resolution of left hydronephrosis. 3. No findings to suggest metastatic disease in the abdomen/pelvis on today's noncontrast study. 4. Cholelithiasis. 5. Hepatic steatosis. 6. Tiny right lower lobe pulmonary nodules. Continued attention on follow-up recommended. 7. 3.5 cm abdominal aortic aneurysm. Recommend follow-up ultrasound every 2 years.  8. Fatty posterolateral left chest wall lesion again noted incompletely visualized. 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 This was better seen on chest CT of 06/24/2019 9. Aortic Atherosclerosis   Renal US 08/12/20 (done for AKI): - Severe left hydronephrosis.  Cortical thinning on the left.  CT chest hi res 05/21/20:  1. No evidence of fibrotic interstitial lung disease. Redemonstrated elevation of the right hemidiaphragm with associated atelectasis or scarring and minimal, bland appearing scarring of the bilateral lung bases. No significant air trapping on expiratory phase imaging.  2. Stable, definitively small nodules of the lateral segment right middle lobe and right middle lobe. 3. Coronary artery disease.  Aortic Atherosclerosis  4.  Redemonstrated large, partially imaged fat containing mass of the left axilla, likely a lipoma.  EKG 12/30/20: afib with slow ventricular response   CV: Echo 06/25/19:  1. Left ventricular ejection fraction, by visual estimation, is 60 to 65%. The left ventricle has normal function. Normal left ventricular size. There is mildly increased left ventricular hypertrophy.   2. Definity contrast agent was given IV to delineate the left ventricular  endocardial borders.   3. Global right ventricle has severely reduced systolic function.The right ventricular size is mildly enlarged.   4. Left atrial size was normal.   5. Right atrial size was mildly dilated.   6. The mitral valve is normal in structure. Trace mitral valve regurgitation. No evidence of mitral stenosis.   7. The tricuspid valve is normal in structure. Tricuspid valve regurgitation is trivial.   8. The aortic valve is tricuspid Aortic valve regurgitation was not visualized by color flow Doppler. Structurally normal aortic valve, with no evidence of sclerosis or stenosis.   9. The pulmonic valve was not well visualized. Pulmonic valve regurgitation is not visualized by color flow Doppler.  10. Mildly elevated pulmonary artery systolic pressure.  11. The inferior vena cava is dilated in size with <50% respiratory variability, suggesting right atrial pressure of 15 mmHg.  12. Definity used; normal LV function; mild LVH; right heart not well visualized but right atrium and ventricle appear to be dilated with significant RV dysfunction.    Lower extremity venous reflux [place] study 12/29/17:  - Right: Abnormal reflux times were noted in the common femoral vein, great  saphenous vein at the groin, and small saphenous vein at the level of the sapheno-popliteal junction. There is no evidence of obvious deep vein thrombosis in the lower extremity.  - Left: Abnormal reflux times were noted in the common femoral vein, femoral vein in the thigh, great  saphenous vein at the groin, and great saphenous vein at the proximal calf. There is no evidence of obvious deep vein thrombosis in the lower extremity.  - Suboptimal exam due to body habitus.   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  Also s/p Bilateral GSV Ablation.   Dyspnea    Essential hypertension    History of kidney stones    Hx of adenomatous colonic polyps    Hyperlipidemia with target LDL less than 100    Longstanding persistent atrial fibrillation (HCC)    Nephrolithiasis 05/2019   CKD stage 3   Obesity    OSA on CPAP  does not cpap    Pneumonia 08/2019   Pre-diabetes    Pulmonary hypertension ([place])    Related to OSA, partial component of HFpEF   Pyelonephritis 05/2019    Past Surgical History:  Procedure Laterality Date   ABDOMINAL VENOUS DUPLEX Bilateral 07/2014   ([place] Cardiology: Dr. Cleda Mccreedy): (prior to Bilateral Iliac-Femoral Venous Stent Placement): Patent IVC.  No DVT B/L Iliac or Com fem V..  +++ DEEP V REFLUX B/L ILIAC & FEM V.  Absent Superficial V Reflux B/L x L SSV (too tortuous for intervention).  BL GSV occluded - prior ablation.    ABDOMINAL VENOUS DUPLEX Bilateral 11/2014   Absent DVT.  Left common femoral venous reflux: 1508 MS left common femoral vein, 2284MS left proximal femoral vein -> cystic structure noted in the left popliteal vein (7.6 x 1.4 x 3.8 cm).;  Right leg deep venous reflux noted in the distal iliac, common femoral, proximal profunda femoral, proximal superficial femoral, superficial femoral and popliteal vein.   CARDIAC CATHETERIZATION  10/2009   [facility] - Dr. Saverio Danker): In response to abnormal cardiac  PET --> mild nonobstructive CAD: 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  EF~45%.  Mild pulmonary pretension.   CARDIAC PET  08/2009   Inferolateral partially reversible defect -- > false positive by cath   COLONOSCOPY     CYSTOSCOPY W/ URETERAL STENT PLACEMENT Left 01/19/2021   Procedure: CYSTOSCOPY WITH STENT EXCHANGE;  Surgeon: Ceasar Mons, MD;  Location: [facility];  Service: Urology;  Laterality: Left;  ONLY NEEDS 30 MIN   CYSTOSCOPY/URETEROSCOPY/HOLMIUM LASER/STENT PLACEMENT Left 11/06/2019   Procedure: CYSTOSCOPY/RETROGRADE/URETEROSCOPY/HOLMIUM LASER/STENT PLACEMENT. REMOVAL NEPHROSCOPYTUBE;  Surgeon: Ceasar Mons, MD;  Location: [facility];  Service: Urology;  Laterality: Left;   ESOPHAGOGASTRODUODENOSCOPY     GREATER SAPHENOUS VEIN ABLATION Bilateral Before 2015   [facility] (Dr. Eugenie Filler)   IR NEPHROSTOMY EXCHANGE LEFT  08/28/2019   IR NEPHROSTOMY PLACEMENT LEFT  06/24/2019   IR TRANSCATH PLC STENT  EA ADD VEIN  INC ANGIOPLASTY Left 10/2014   ([place], Dr. 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 Stormy Fabian): US Guided --> R Common & External Iliac, Common Femoral Venography with Placement of 24 mmx 70 mm Stent - R Com Iliac-Ext Iliac- & Com Fem V. reducing 60-80% venous compression to ~0%).;; also noted significant L-sided venous compression (staged stenting).    LIPOMA EXCISION     x 2 came back again   NM MYOVIEW LTD  11/2005   [facility] - Dr. Cleda Mccreedy): Normal myocardial perfusion scan. No ischemia or infarction.   TRANSTHORACIC ECHOCARDIOGRAM  04/26/2017    [facility] - Dr. Cleda Mccreedy) - report not available.  Per clinic note: Normal global LV function.  Mild concentric temperature. Mild TR. Mild pulmonary hypertension.;;    TRANSTHORACIC ECHOCARDIOGRAM  7/'15; 7/'17   [facility] - Dr. Saverio Danker) a) low normal LV function (EF 56%). Moderate concentric LVH. Mild LA dilation. Normal pulmonary pressures.  ;; b) normal global function - EF 60-65%. Moderate pulmonary hypertension. Biatrial enlargement. Moderate concentric LVH.   TRANSTHORACIC ECHOCARDIOGRAM  02/2010   [facility] - Dr. Saverio Danker): Normal global LV function.  Mild LVH. EF 60%.; November 2010. Moderate LV dilation. Moderate LVH. Normal EF 50-55%. Mild to moderate pulmonary hypertension. (July 2012 showed normal function with mild LVH, mild MR noted in August 2014)   TRANSTHORACIC ECHOCARDIOGRAM  05/2019   During admission for UTI, sepsis with A. fib RVR: LV normal size and function.  EF 60-65%.  Mildly reduced RV size with severely reduced function.  Despite this normal atrial size noted with just only mild right atrial enlargement with elevated RAP (dilated IVC).  Relatively poor TR jet, but findings do suggest elevated RV pressures.   TRANSURETHRAL RESECTION OF BLADDER TUMOR Left 10/21/2020   Procedure: TRANSURETHRAL RESECTION OF BLADDER TUMOR (TURBT)/ RETROGRADE/  URETERAL STENT PLACEMENT;  Surgeon: Ceasar Mons, MD;  Location: [facility];  Service: Urology;  Laterality: Left;    MEDICATIONS:  atorvastatin (LIPITOR) 10 MG tablet   HYDROcodone-acetaminophen (NORCO/VICODIN) 5-325 MG tablet   loperamide (IMODIUM A-D) 2 MG tablet   metoprolol tartrate (LOPRESSOR) 25 MG tablet   oxybutynin (DITROPAN) 5 MG tablet   phenazopyridine (PYRIDIUM) 200 MG tablet   potassium chloride SA (KLOR-CON) 20 MEQ tablet   prochlorperazine (COMPAZINE) 10 MG tablet   tamsulosin (FLOMAX) 0.4 MG CAPS capsule   torsemide (DEMADEX) 20 MG tablet   XARELTO 20 MG TABS tablet   No current facility-administered medications for this encounter.    If no changes, I anticipate pt  can proceed with surgery as scheduled.   Willeen Cass, PhD, FNP-BC [facility] Short Stay Surgical Center/Anesthesiology Phone: [phone] 05/11/2021 1:29 PM

## 2021-05-11 NOTE — Anesthesia Preprocedure Evaluation (Addendum)
Anesthesia Evaluation  Patient identified by MRN, date of birth, ID band Patient awake    Reviewed: Allergy & Precautions, H&P , NPO status , Patient's Chart, lab work & pertinent test results, reviewed documented beta blocker date and time   Airway Mallampati: II  TM Distance: >3 FB Neck ROM: Full    Dental no notable dental hx. (+) Edentulous Upper, Edentulous Lower, Dental Advisory Given   Pulmonary shortness of breath and with exertion, sleep apnea , former smoker,  Pulmonary HTN   Pulmonary exam normal breath sounds clear to auscultation       Cardiovascular hypertension, Pt. on medications and Pt. on home beta blockers  Rhythm:Regular Rate:Normal     Neuro/Psych negative neurological ROS  negative psych ROS   GI/Hepatic negative GI ROS, Neg liver ROS,   Endo/Other  Morbid obesity  Renal/GU Renal disease  negative genitourinary   Musculoskeletal  (+) Arthritis ,   Abdominal   Peds  Hematology negative hematology ROS (+)   Anesthesia Other Findings   Reproductive/Obstetrics negative OB ROS                           Anesthesia Physical Anesthesia Plan  ASA: 3  Anesthesia Plan: General   Post-op Pain Management:    Induction: Intravenous  PONV Risk Score and Plan: 3 and Ondansetron, Dexamethasone and Treatment may vary due to age or medical condition  Airway Management Planned: LMA  Additional Equipment:   Intra-op Plan:   Post-operative Plan: Extubation in OR  Informed Consent: I have reviewed the patients History and Physical, chart, labs and discussed the procedure including the risks, benefits and alternatives for the proposed anesthesia with the patient or authorized representative who has indicated his/her understanding and acceptance.     Dental advisory given  Plan Discussed with: CRNA  Anesthesia Plan Comments: (See APP note by Durel Salts, FNP )        Anesthesia Quick Evaluation

## 2021-05-12 ENCOUNTER — Ambulatory Visit (HOSPITAL_COMMUNITY): Payer: Medicare Other

## 2021-05-12 ENCOUNTER — Encounter (HOSPITAL_COMMUNITY): Payer: Self-pay | Admitting: Urology

## 2021-05-12 ENCOUNTER — Ambulatory Visit (HOSPITAL_COMMUNITY): Payer: Medicare Other | Admitting: Anesthesiology

## 2021-05-12 ENCOUNTER — Ambulatory Visit (HOSPITAL_COMMUNITY)
Admission: RE | Admit: 2021-05-12 | Discharge: 2021-05-12 | Disposition: A | Discharge status: Home / Self Care | Payer: Medicare Other | Attending: Urology | Admitting: Urology

## 2021-05-12 ENCOUNTER — Ambulatory Visit (HOSPITAL_COMMUNITY): Payer: Medicare Other | Admitting: Emergency Medicine

## 2021-05-12 ENCOUNTER — Encounter (HOSPITAL_COMMUNITY)
Admission: RE | Disposition: A | Discharge status: Home / Self Care | Payer: Self-pay | Source: Home / Self Care | Attending: Urology

## 2021-05-12 DIAGNOSIS — E785 Hyperlipidemia, unspecified: Secondary | ICD-10-CM | POA: Diagnosis not present

## 2021-05-12 DIAGNOSIS — G4733 Obstructive sleep apnea (adult) (pediatric): Secondary | ICD-10-CM | POA: Diagnosis not present

## 2021-05-12 DIAGNOSIS — Z6841 Body Mass Index (BMI) 40.0 and over, adult: Secondary | ICD-10-CM | POA: Diagnosis not present

## 2021-05-12 DIAGNOSIS — R350 Frequency of micturition: Secondary | ICD-10-CM | POA: Diagnosis not present

## 2021-05-12 DIAGNOSIS — E669 Obesity, unspecified: Secondary | ICD-10-CM | POA: Diagnosis not present

## 2021-05-12 DIAGNOSIS — Z923 Personal history of irradiation: Secondary | ICD-10-CM | POA: Diagnosis not present

## 2021-05-12 DIAGNOSIS — I739 Peripheral vascular disease, unspecified: Secondary | ICD-10-CM | POA: Insufficient documentation

## 2021-05-12 DIAGNOSIS — Z888 Allergy status to other drugs, medicaments and biological substances status: Secondary | ICD-10-CM | POA: Diagnosis not present

## 2021-05-12 DIAGNOSIS — Z9221 Personal history of antineoplastic chemotherapy: Secondary | ICD-10-CM | POA: Diagnosis not present

## 2021-05-12 DIAGNOSIS — N131 Hydronephrosis with ureteral stricture, not elsewhere classified: Secondary | ICD-10-CM | POA: Insufficient documentation

## 2021-05-12 DIAGNOSIS — Z08 Encounter for follow-up examination after completed treatment for malignant neoplasm: Secondary | ICD-10-CM | POA: Insufficient documentation

## 2021-05-12 DIAGNOSIS — Z79899 Other long term (current) drug therapy: Secondary | ICD-10-CM | POA: Diagnosis not present

## 2021-05-12 DIAGNOSIS — I714 Abdominal aortic aneurysm, without rupture: Secondary | ICD-10-CM | POA: Diagnosis not present

## 2021-05-12 DIAGNOSIS — K760 Fatty (change of) liver, not elsewhere classified: Secondary | ICD-10-CM | POA: Diagnosis not present

## 2021-05-12 DIAGNOSIS — C676 Malignant neoplasm of ureteric orifice: Secondary | ICD-10-CM | POA: Diagnosis not present

## 2021-05-12 DIAGNOSIS — N135 Crossing vessel and stricture of ureter without hydronephrosis: Secondary | ICD-10-CM | POA: Diagnosis not present

## 2021-05-12 DIAGNOSIS — N138 Other obstructive and reflux uropathy: Secondary | ICD-10-CM | POA: Diagnosis not present

## 2021-05-12 DIAGNOSIS — Z87891 Personal history of nicotine dependence: Secondary | ICD-10-CM | POA: Diagnosis not present

## 2021-05-12 DIAGNOSIS — Z8551 Personal history of malignant neoplasm of bladder: Secondary | ICD-10-CM | POA: Diagnosis not present

## 2021-05-12 DIAGNOSIS — Z7901 Long term (current) use of anticoagulants: Secondary | ICD-10-CM | POA: Insufficient documentation

## 2021-05-12 DIAGNOSIS — K8020 Calculus of gallbladder without cholecystitis without obstruction: Secondary | ICD-10-CM | POA: Insufficient documentation

## 2021-05-12 DIAGNOSIS — R3915 Urgency of urination: Secondary | ICD-10-CM | POA: Diagnosis not present

## 2021-05-12 DIAGNOSIS — C679 Malignant neoplasm of bladder, unspecified: Secondary | ICD-10-CM | POA: Diagnosis present

## 2021-05-12 HISTORY — PX: CYSTOSCOPY WITH STENT PLACEMENT: SHX5790

## 2021-05-12 SURGERY — CYSTOSCOPY, WITH STENT INSERTION
Anesthesia: General | Laterality: Left

## 2021-05-12 MED ORDER — ONDANSETRON HCL 4 MG/2ML IJ SOLN
INTRAMUSCULAR | Status: DC | PRN
  Administered 2021-05-12: 4 mg via INTRAVENOUS

## 2021-05-12 MED ORDER — LIDOCAINE 2% (20 MG/ML) 5 ML SYRINGE
INTRAMUSCULAR | Status: AC
  Filled 2021-05-12: qty 5

## 2021-05-12 MED ORDER — LIDOCAINE 2% (20 MG/ML) 5 ML SYRINGE
INTRAMUSCULAR | Status: DC | PRN
Start: 2021-05-12 — End: 2021-05-12
  Administered 2021-05-12: 80 mg via INTRAVENOUS

## 2021-05-12 MED ORDER — FENTANYL CITRATE (PF) 100 MCG/2ML IJ SOLN: 25.0000 ug | INTRAMUSCULAR | Status: DC | PRN

## 2021-05-12 MED ORDER — ONDANSETRON HCL 4 MG/2ML IJ SOLN
INTRAMUSCULAR | Status: AC
  Filled 2021-05-12: qty 2

## 2021-05-12 MED ORDER — PROPOFOL 10 MG/ML IV BOLUS
INTRAVENOUS | Status: DC | PRN
  Administered 2021-05-12: 130 mg via INTRAVENOUS

## 2021-05-12 MED ORDER — FENTANYL CITRATE (PF) 100 MCG/2ML IJ SOLN
INTRAMUSCULAR | Status: DC | PRN
  Administered 2021-05-12: 25 ug via INTRAVENOUS

## 2021-05-12 MED ORDER — LACTATED RINGERS IV SOLN: INTRAVENOUS | Status: DC

## 2021-05-12 MED ORDER — CEPHALEXIN 500 MG PO CAPS: 500.0000 mg | ORAL_CAPSULE | Freq: Two times a day (BID) | ORAL | 0 refills | Status: AC

## 2021-05-12 MED ORDER — SODIUM CHLORIDE 0.9 % IR SOLN
Status: DC | PRN
  Administered 2021-05-12: 3000 mL

## 2021-05-12 MED ORDER — PROPOFOL 10 MG/ML IV BOLUS
INTRAVENOUS | Status: AC
  Filled 2021-05-12: qty 20

## 2021-05-12 MED ORDER — DEXTROSE 5 % IV SOLN
INTRAVENOUS | Status: DC | PRN
  Administered 2021-05-12: 3 g via INTRAVENOUS

## 2021-05-12 MED ORDER — DEXAMETHASONE SODIUM PHOSPHATE 10 MG/ML IJ SOLN
INTRAMUSCULAR | Status: AC
  Filled 2021-05-12: qty 1

## 2021-05-12 MED ORDER — DEXAMETHASONE SODIUM PHOSPHATE 10 MG/ML IJ SOLN
INTRAMUSCULAR | Status: DC | PRN
  Administered 2021-05-12: 4 mg via INTRAVENOUS

## 2021-05-12 MED ORDER — FENTANYL CITRATE (PF) 100 MCG/2ML IJ SOLN
INTRAMUSCULAR | Status: AC
  Filled 2021-05-12: qty 2

## 2021-05-12 MED ORDER — CHLORHEXIDINE GLUCONATE 0.12 % MT SOLN
15.0000 mL | Freq: Once | OROMUCOSAL | Status: AC
  Administered 2021-05-12: 15 mL via OROMUCOSAL

## 2021-05-12 MED ORDER — CEFAZOLIN IN SODIUM CHLORIDE 3-0.9 GM/100ML-% IV SOLN
3.0000 g | INTRAVENOUS | Status: DC
  Filled 2021-05-12: qty 100

## 2021-05-12 MED ORDER — ORAL CARE MOUTH RINSE: 15.0000 mL | Freq: Once | OROMUCOSAL | Status: AC

## 2021-05-12 SURGICAL SUPPLY — 12 items
BAG URO CATCHER STRL LF (MISCELLANEOUS) ×2 IMPLANT
CATH URET 5FR 28IN OPEN ENDED (CATHETERS) ×2 IMPLANT
CLOTH BEACON ORANGE TIMEOUT ST (SAFETY) ×2 IMPLANT
GLOVE SURG ENC TEXT LTX SZ7.5 (GLOVE) ×2 IMPLANT
GOWN STRL REUS W/TWL XL LVL3 (GOWN DISPOSABLE) ×2 IMPLANT
GUIDEWIRE STR DUAL SENSOR (WIRE) IMPLANT
GUIDEWIRE ZIPWRE .038 STRAIGHT (WIRE) ×2 IMPLANT
KIT TURNOVER KIT A (KITS) ×2 IMPLANT
MANIFOLD NEPTUNE II (INSTRUMENTS) ×2 IMPLANT
PACK CYSTO (CUSTOM PROCEDURE TRAY) ×2 IMPLANT
STENT URET 6FRX26 CONTOUR (STENTS) ×2 IMPLANT
TUBING CONNECTING 10 (TUBING) ×2 IMPLANT

## 2021-05-12 NOTE — Anesthesia Procedure Notes (Signed)
Procedure Name: LMA Insertion Date/Time: 05/12/2021 11:19 AM Performed by: Milford Cage, CRNA Pre-anesthesia Checklist: Patient identified, Emergency Drugs available, Suction available and Patient being monitored Patient Re-evaluated:Patient Re-evaluated prior to induction Oxygen Delivery Method: Circle system utilized Preoxygenation: Pre-oxygenation with 100% oxygen Induction Type: IV induction Ventilation: Mask ventilation without difficulty LMA: LMA inserted LMA Size: 5.0 Number of attempts: 1 Tube secured with: Tape Dental Injury: Teeth and Oropharynx as per pre-operative assessment

## 2021-05-12 NOTE — H&P (Signed)
PRE-OP H&P  Office Visit Report     04/22/2021   --------------------------------------------------------------------------------   Jt Brabec  MRN: [id number]  DOB: 1941-07-04, 80 year old Male  SSN:    PRIMARY CARE:  [name] [name], MD  REFERRING:  Glena Norfolk. Lovena Neighbours, MD  PROVIDER:  Ellison Hughs, M.D.  LOCATION:  [facility] [phone]     --------------------------------------------------------------------------------   CC/HPI: Bladder cancer   Mr. Lapinsky is a 80 year old male with a history of kidney stones, CHF, A-fib (on lovenox), PVD, LE lymphedema, HTN, HLD, obesity and OSA. 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 s/p chemo/radiation in 2022 as primary treatment.   10/29/2020: The patient is here today for routine follow-up following TURBT and left ureteral stent placement on 10/21/2020. Pathologic analysis of the bladder mass sample revealed high-grade muscle invasive urothelial carcinoma. The patient has done well after surgery and denies any significant issue with gross hematuria, left flank pain or pain with his Foley catheter. He is accompanied by his daughter [name] at today's visit.   11/04/2020: Unsuccessful trial of void at last office visit. His catheter was replaced and patient started on tamsulosin. His urologist felt it was imperative to leave indwelling left ureteral stent until further follow-up in evaluation could occur. He has pending evaluation with Dr Tresa Moore, Dr Alen Blew and staging CT imaging in the near future. He is here today for consideration of repeat trial of void.   Patient tolerating tamsulosin well without any noted side effect. Catheter has been draining appropriately. He has had minimal hematuria, no exacerbation of left-sided pain/discomfort. He denies painful urgency or significant leaking  around Catheter tubing. No interval fevers or chills, nausea/vomiting. Patient has follow-up with Dr Alen Blew tomorrow and is scheduled for CT imaging next week. He is scheduled to see Dr Tresa Moore in March.   12/28/2020: The patient is here today for a routine follow-up with his daughter [name] He recently started chemoradiation to treat his muscle invasive bladder cancer. He is tolerating treatments well thus far. He reports stable urinary urgency since his last visit and states that he has an adequate force of stream and feels like he is emptying his bladder well. He denies interval UTIs or flank pain, but does have episodes of dysuria and gross hematuria. No recent staging imaging has been performed.   04/22/21: The patient is here today for a routine follow-up. Recent surveillance CT scan showed no evidence of metastasis. He continues to have urinary urgency and frequency every 1-2 hours, which is baseline for him. He denies interval UTIs, dysuria or gross hematuria.     ALLERGIES: Aldactone TABS    MEDICATIONS: Metoprolol Tartrate 25 mg tablet  Tamsulosin Hcl 0.4 mg capsule 1 capsule PO Daily  Atorvastatin Calcium 10 mg tablet  Hydrocodone-Acetaminophen 5 mg-325 mg tablet  Torsemide  Xarelto 20 mg tablet     Notes: pt unaware of his medications   GU PSH: Cysto Remove Stent FB Sim - 11/13/2019 Cystoscopy Insert Stent, Left - 01/19/2021, Left - 10/21/2020 Cystoscopy TURBT 2-5 cm - 10/21/2020 Locm 300-399Mg /Ml Iodine,1Ml - 01/11/2021 Ureteroscopic laser litho, Left - 11/06/2019       PSH Notes: Surgical History  06/24/2019 Ir nephrostomy placement left  04/26/2017 Transthoracic echocardiogram  11/2014 ABDOMINAL VENOUS DUPLEX [Other] (Bilateral)  10/2014 Ir transcath plc stent ea add vein inc angioplasty (Left)  08/2014 Ir transcath  plc stent initial vein inc angioplasty (Right)  07/2014 ABDOMINAL VENOUS DUPLEX [Other] (Bilateral)  02/2010 Transthoracic echocardiogram  10/2009 Cardiac  catheterization  08/2009 CARDIAC PET [Other]  11/2005 Nm myoview ltd  Date Unknown Colonoscopy  Date Unknown Esophagogastroduodenoscopy  Before 2015 GREATER SAPHENOUS VEIN ABLATION [Other] (Bilateral)      NON-GU PSH: Cardiac Stent Placement     GU PMH: Bladder Cancer Trigone - 01/11/2021, - 12/28/2020, - 11/04/2020, Muscle invasive bladder cancer involving the left fore trigone with direct involvement of the left ureteral orifice, status post partial tumor resection on 10/21/2020., - 10/29/2020 Hydronephrosis - 12/28/2020, - 11/04/2020 (Stable), - 10/29/2020, - 08/18/2020 Incomplete bladder emptying - 10/29/2020 Acute kidney failure, Acute on Chronic. Creatinine has been trending upwards over the past year. It was 1.2 in January prior to stone procedure, now 1.99. 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 -    Atrial fibrillation (HCC)    Congestive heart failure (HCC)  Respiratory  OSA on CPAP  Digestive    Cholelithiasis  Nervous and Auditory    Hearing loss  Musculoskeletal and Integument    Arthritis  Genitourinary    Septic shock due to urinary tract infection (HCC)    Hydronephrosis with obstructing calculus  Other  Hyperlipidemia with target LDL less than 100  Morbid obesity (HCC)    Chronic pain    Lymphedema    Muscle weakness    Unsteady gait    Elevated troponin    Heme + stool    Decreased hemoglobin      NON-GU PMH: Pyuria/other UA findings - 12/28/2020, - 2021 Bacteriuria - 2021    FAMILY HISTORY: Cancer - Mother Hypertension - Father, Brother   SOCIAL HISTORY: Marital Status: Widowed Preferred Language:  English; Race: White Current Smoking Status: Patient does not smoke anymore.   Tobacco Use Assessment Completed: Used Tobacco in last 30 days? Has never drank.  Does not drink caffeine.    REVIEW OF SYSTEMS:    GU Review Male:   Patient denies frequent urination, hard to postpone urination, burning/ pain with urination, get up at night to urinate, leakage of urine, stream starts and stops, trouble starting your stream, have to strain to urinate , erection problems, and penile pain.  Gastrointestinal (Upper):   Patient denies nausea, vomiting, and indigestion/ heartburn.  Gastrointestinal (Lower):   Patient denies diarrhea and constipation.  Constitutional:   Patient denies fever, night sweats, weight loss, and fatigue.  Skin:   Patient denies skin rash/ lesion and itching.  Eyes:   Patient denies blurred vision and double vision.  Ears/ Nose/ Throat:   Patient denies sore throat and sinus problems.  Hematologic/Lymphatic:   Patient denies swollen glands and easy bruising.  Cardiovascular:   Patient denies chest pains and leg swelling.  Respiratory:   Patient denies cough and shortness of breath.  Endocrine:   Patient denies excessive thirst.  Musculoskeletal:   Patient denies back pain and joint pain.  Neurological:   Patient denies headaches and dizziness.  Psychologic:   Patient denies depression and anxiety.   VITAL SIGNS:      04/22/2021 10:20 AM  Weight 310 lb / 140.61 kg  Height 73 in / 185.42 cm  BP 122/74 mmHg  Pulse 76 /min  Temperature 97.3 F / 36.2 C  BMI 40.9 kg/m   Complexity of Data:  Records Review:   Pathology Reports, Previous Patient Records  X-Ray Review: C.T. Abdomen/Pelvis: Reviewed Films. Reviewed Report. Discussed With Patient.    Notes:                     CLINICAL DATA: Bladder cancer. Restaging.     EXAM:  CT ABDOMEN AND PELVIS WITHOUT CONTRAST     TECHNIQUE:  Multidetector CT imaging of the abdomen and pelvis was performed  following the  standard protocol without IV contrast.     COMPARISON: 01/11/2021     FINDINGS:  Lower chest: 5 mm right middle lobe nodule (image 6/series 4) mildly  more conspicuous today than previously. 6 mm nodule right lung base  on 22/4 is similar to prior. There is some atelectasis in the lower  lungs bilaterally, right greater than left.     Hepatobiliary: The liver shows diffusely decreased attenuation  suggesting fat deposition. No focal abnormality in the liver on this  study without intravenous contrast. Tiny calcified gallstones  evident. No intrahepatic or extrahepatic biliary dilation.     Pancreas: No focal mass lesion. No dilatation of the main duct. No  intraparenchymal cyst. No peripancreatic edema.     Spleen: No splenomegaly. No focal mass lesion.     Adrenals/Urinary Tract: No adrenal nodule or mass. Right kidney  unremarkable. Left renal atrophy is similar to prior. Left  hydronephrosis has resolved in the interval. Left internal ureteral  stent again visualized in situ. Soft tissue fullness noted in the  region of the left UVJ today but bladder wall thickening noted  previously in this region not well demonstrated on today's  noncontrast study.     Stomach/Bowel: Stomach is unremarkable. No gastric wall thickening.  No evidence of outlet obstruction. Duodenum is normally positioned  as is the ligament of Treitz. No small bowel wall thickening. No  small bowel dilatation. The terminal ileum is normal. The appendix  is normal. No gross colonic mass. No colonic wall thickening.     Vascular/Lymphatic: Moderate atherosclerotic calcification is noted  in the wall of the thoracic aorta. Infrarenal abdominal aorta  measures 3.5 cm diameter. Bilateral external iliac vein stents noted  with stent device also evident in the left common iliac vein. There  is no gastrohepatic or hepatoduodenal ligament lymphadenopathy. No  retroperitoneal or mesenteric lymphadenopathy. No pelvic  sidewall  lymphadenopathy.     Reproductive: The prostate gland and seminal vesicles are  unremarkable.     Other: No intraperitoneal free fluid.     Musculoskeletal: No worrisome lytic or sclerotic osseous  abnormality. Degenerative changes noted in the hips bilaterally,  left greater than right. Degenerative disc disease noted lumbar  spine. Fatty lesion again noted in the posterolateral left chest  wall, incompletely visualized.     IMPRESSION:  1. Soft tissue fullness noted in the region of the left UVJ today  but bladder wall thickening noted previously in this region not well  demonstrated on today's noncontrast study.  2. Left internal ureteral stent in situ with resolution of left  hydronephrosis.  3. No findings to suggest metastatic disease in the abdomen/pelvis  on today's noncontrast study.  4. Cholelithiasis.  5. Hepatic steatosis.  6. Tiny right lower lobe pulmonary nodules. Continued attention on  follow-up recommended.  7. 3.5 cm abdominal  aortic aneurysm. Recommend follow-up ultrasound  every 2 years. This recommendation follows ACR consensus guidelines:  White Paper of the ACR Incidental Findings Committee II on Vascular  Findings. J Am Coll Radiol 2013; 10:789-794.  8. Fatty posterolateral left chest wall lesion again noted,  incompletely visualized. 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 This was better  seen on chest CT of 06/24/2019  9. Aortic Atherosclerosis (ICD10-I70.0).        Electronically Signed  By: Misty Stanley M.D.  On: 04/07/2021 15:36   PROCEDURES:          Urinalysis w/Scope - 81001 Dipstick Dipstick Cont'd Micro  Color: Amber Bilirubin: Neg WBC/hpf: >60/hpf  Appearance: Cloudy Ketones: Neg RBC/hpf: 3 - 10/hpf  Specific Gravity: 1.025 Blood: Trace Bacteria: Many (>50/hpf)  pH: 5.5 Protein: 2+ Cystals: NS (Not Seen)  Glucose: Neg Urobilinogen: 0.2 Casts: NS (Not Seen)    Nitrites: Positive Trichomonas: Not Present    Leukocyte Esterase: 3+ Mucous:  Not Present      Epithelial Cells: 0 - 5/hpf      Yeast: NS (Not Seen)      Sperm: Not Present    Notes:      ASSESSMENT:      ICD-10 Details  1 GU:   Hydronephrosis - N13.0 Left, Chronic, Stable  2   Bladder Cancer Trigone - C67.0 Chronic, Stable     PLAN:           Orders Labs Urine Culture          Schedule Return Visit/Planned Activity: Next Available Appointment - Follow up MD          Document Letter(s):  Created for Patient: Clinical Summary         Notes:   -Plan for cystoscopy left ureteral stent exchange and possible bladder biopsy and/or TURBT. Risk, benefits alternatives discussed.

## 2021-05-12 NOTE — Op Note (Signed)
Operative Note  Preoperative diagnosis:  1.  Muscle invasive bladder cancer 2.  Left ureteral obstruction  Postoperative diagnosis: 1.  Muscle invasive bladder cancer 2.  Left ureteral obstruction  Procedure(s): 1.  Cystoscopy with left ureteral stent exchange  Surgeon: Ellison Hughs, MD  Assistants:  None  Anesthesia:  General  Complications:  None  EBL: Less than 5 mL  Specimens: 1.  Previously placed left JJ stent was removed intact, inspected and discarded  Drains/Catheters: 1.  Left 6 French, 26 cm JJ stent without tether  Intraoperative findings:   No evidence of bladder cancer recurrence was seen cystoscopically.  Indication:  Patrick Bishop is a 80 y.o. male with a history of muscle invasive bladder cancer treated with chemo/radiation as well as left ureteral obstruction due to direct involvement of his previous bladder tumor involving the left ureteral orifice.  He is here today for a routine stent exchange.  He has been consented for the above procedures, voices understanding and wishes to proceed.  Description of procedure:  After informed consent was obtained, the patient was brought to the operating room and general LMA anesthesia was administered. The patient was then placed in the dorsolithotomy position and prepped and draped in the usual sterile fashion. A timeout was performed. A 23 French rigid cystoscope was then inserted into the urethral meatus and advanced into the bladder under direct vision. A complete bladder survey revealed no intravesical pathology.  His previously placed left ureteral stent was grasped at its distal curl and retracted to the urethral meatus.  A sensor wire was then advanced through the lumen of the stent and up to the left renal pelvis, or fluoroscopic guidance.  The previously placed stent was then removed intact, inspected and discarded.  A new 6 [place], 26 cm JJ stent was then advanced over the wire and into good position  within the left collecting system, confirming placement via fluoroscopy.  The patient's bladder was drained.  He tolerated the procedure well and was transferred to the postanesthesia in stable condition.  Plan: Follow-up on 08/12/2021 for a checkup and to schedule his next stent exchange

## 2021-05-12 NOTE — Anesthesia Postprocedure Evaluation (Signed)
Anesthesia Post Note  Patient: Patrick Bishop  Procedure(s) Performed: CYSTOSCOPY WITH LEFT STENT EXCHANGE (Left)     Patient location during evaluation: PACU Anesthesia Type: General Level of consciousness: awake and alert Pain management: pain level controlled Vital Signs Assessment: post-procedure vital signs reviewed and stable Respiratory status: spontaneous breathing, nonlabored ventilation and respiratory function stable Cardiovascular status: blood pressure returned to baseline and stable Postop Assessment: no apparent nausea or vomiting Anesthetic complications: no   No notable events documented.  Last Vitals:  Vitals:   05/12/21 1225 05/12/21 1249  BP: 130/62 106/74  Pulse: (!) 51 75  Resp: 19 18  Temp: (!) 36.4 C   SpO2: 94% 97%    Last Pain:  Vitals:   05/12/21 1249  TempSrc:   PainSc: 0-No pain                 Kymberly Blomberg,W. EDMOND

## 2021-05-12 NOTE — Transfer of Care (Signed)
Immediate Anesthesia Transfer of Care Note  Patient: Kenrick Pore  Procedure(s) Performed: CYSTOSCOPY WITH LEFT STENT EXCHANGE (Left)  Patient Location: PACU  Anesthesia Type:General  Level of Consciousness: awake  Airway & Oxygen Therapy: Patient Spontanous Breathing  Post-op Assessment: Report given to RN and Post -op Vital signs reviewed and stable  Post vital signs: Reviewed and stable  Last Vitals:  Vitals Value Taken Time  BP 127/76 05/12/21 1201  Temp 36.5 C 05/12/21 1200  Pulse 65 05/12/21 1205  Resp 12 05/12/21 1205  SpO2 96 % 05/12/21 1205  Vitals shown include unvalidated device data.  Last Pain:  Vitals:   05/12/21 1200  TempSrc:   PainSc: 0-No pain      Patients Stated Pain Goal: 5 (87/27/61 8485)  Complications: No notable events documented.

## 2021-05-13 ENCOUNTER — Encounter (HOSPITAL_COMMUNITY): Payer: Self-pay | Admitting: Urology

## 2021-05-24 ENCOUNTER — Other Ambulatory Visit: Payer: Self-pay

## 2021-05-24 ENCOUNTER — Ambulatory Visit
Admission: RE | Admit: 2021-05-24 | Discharge: 2021-05-24 | Disposition: A | Discharge status: Home / Self Care | Payer: Medicare Other | Source: Ambulatory Visit | Attending: Pulmonary Disease | Admitting: Pulmonary Disease

## 2021-05-24 ENCOUNTER — Encounter: Payer: Self-pay | Admitting: Oncology

## 2021-05-24 DIAGNOSIS — R918 Other nonspecific abnormal finding of lung field: Secondary | ICD-10-CM | POA: Diagnosis not present

## 2021-05-24 DIAGNOSIS — I700 Atherosclerosis of aorta: Secondary | ICD-10-CM | POA: Diagnosis not present

## 2021-05-24 DIAGNOSIS — J9811 Atelectasis: Secondary | ICD-10-CM | POA: Diagnosis not present

## 2021-05-24 DIAGNOSIS — R911 Solitary pulmonary nodule: Secondary | ICD-10-CM

## 2021-05-27 DIAGNOSIS — C689 Malignant neoplasm of urinary organ, unspecified: Secondary | ICD-10-CM | POA: Diagnosis not present

## 2021-05-27 DIAGNOSIS — N1330 Unspecified hydronephrosis: Secondary | ICD-10-CM | POA: Diagnosis not present

## 2021-05-27 DIAGNOSIS — R609 Edema, unspecified: Secondary | ICD-10-CM | POA: Diagnosis not present

## 2021-05-27 DIAGNOSIS — I129 Hypertensive chronic kidney disease with stage 1 through stage 4 chronic kidney disease, or unspecified chronic kidney disease: Secondary | ICD-10-CM | POA: Diagnosis not present

## 2021-05-27 DIAGNOSIS — D631 Anemia in chronic kidney disease: Secondary | ICD-10-CM | POA: Diagnosis not present

## 2021-05-27 DIAGNOSIS — N183 Chronic kidney disease, stage 3 unspecified: Secondary | ICD-10-CM | POA: Diagnosis not present

## 2021-05-27 DIAGNOSIS — N189 Chronic kidney disease, unspecified: Secondary | ICD-10-CM | POA: Diagnosis not present

## 2021-05-27 DIAGNOSIS — N2581 Secondary hyperparathyroidism of renal origin: Secondary | ICD-10-CM | POA: Diagnosis not present

## 2021-06-14 DIAGNOSIS — G894 Chronic pain syndrome: Secondary | ICD-10-CM | POA: Diagnosis not present

## 2021-06-14 DIAGNOSIS — M169 Osteoarthritis of hip, unspecified: Secondary | ICD-10-CM | POA: Diagnosis not present

## 2021-06-14 DIAGNOSIS — M1710 Unilateral primary osteoarthritis, unspecified knee: Secondary | ICD-10-CM | POA: Diagnosis not present

## 2021-06-14 DIAGNOSIS — I739 Peripheral vascular disease, unspecified: Secondary | ICD-10-CM | POA: Diagnosis not present

## 2021-06-16 DIAGNOSIS — D649 Anemia, unspecified: Secondary | ICD-10-CM | POA: Diagnosis not present

## 2021-06-18 ENCOUNTER — Other Ambulatory Visit: Payer: Self-pay | Admitting: *Deleted

## 2021-06-18 DIAGNOSIS — R911 Solitary pulmonary nodule: Secondary | ICD-10-CM

## 2021-06-18 NOTE — Progress Notes (Signed)
Order placed for the super d chest ct.

## 2021-07-12 DIAGNOSIS — I739 Peripheral vascular disease, unspecified: Secondary | ICD-10-CM | POA: Diagnosis not present

## 2021-07-12 DIAGNOSIS — M1710 Unilateral primary osteoarthritis, unspecified knee: Secondary | ICD-10-CM | POA: Diagnosis not present

## 2021-07-12 DIAGNOSIS — G894 Chronic pain syndrome: Secondary | ICD-10-CM | POA: Diagnosis not present

## 2021-07-12 DIAGNOSIS — M169 Osteoarthritis of hip, unspecified: Secondary | ICD-10-CM | POA: Diagnosis not present

## 2021-07-14 ENCOUNTER — Telehealth: Payer: Self-pay | Admitting: Cardiology

## 2021-07-14 DIAGNOSIS — M7989 Other specified soft tissue disorders: Secondary | ICD-10-CM

## 2021-07-14 DIAGNOSIS — S81801A Unspecified open wound, right lower leg, initial encounter: Secondary | ICD-10-CM

## 2021-07-14 DIAGNOSIS — I872 Venous insufficiency (chronic) (peripheral): Secondary | ICD-10-CM

## 2021-07-14 NOTE — Telephone Encounter (Signed)
Pt c/o swelling: STAT is pt has developed SOB within 24 hours  If swelling, where is the swelling located? legs  How much weight have you gained and in what time span? Not sure  Have you gained 3 pounds in a day or 5 pounds in a week? Not sure  Do you have a log of your daily weights (if so, list)? No. Patient does not track his weight  Are you currently taking a fluid pill? yes  Are you currently SOB? no  Have you traveled recently? No   Patient said his legs are swollen and he is starting to get open wounds on his legs. He wants to know what to do before they get infected. He is worried about going to the hospital again

## 2021-07-14 NOTE — Telephone Encounter (Signed)
It is hard to know what to do when I do not know what his weights are.  I would probably say that he should increase his torsemide dose to 80 mg in the morning and 60 mg in the p.m. (do it for 2 days and then alternate every other day to his baseline dosing) to see if the swelling go down.  Would need chemistry panel checked after a week.  Also recommend that he be seen by wound care to see if they can help with managing the wounds on his leg. Wound Care C/s  He can probably be seen after he has done the higher dose of Lasix for a week or so. He should be weighing himself daily.  Glenetta Hew, MD

## 2021-07-14 NOTE — Telephone Encounter (Signed)
I spoke with patient. He reports he has had swelling in both legs for awhile. Does not weigh daily. No shortness of breath. He reports he now has 4-5 open wounds on his right leg. They vary in size but average about one inch square.  He has not seen anyone for this. Reports it happened in the past and he had to be hospitalized. He is taking torsemide 60 mg in the morning and 40 mg in the PM.  Tries to elevate legs but reports he does not elevate them as much as he should.  Primary care doctor is [place].  He is not sure who to see for this.  Will forward to Dr Ellyn Hack for review/recommendations.

## 2021-07-15 ENCOUNTER — Other Ambulatory Visit: Payer: Self-pay | Admitting: *Deleted

## 2021-07-15 DIAGNOSIS — M7989 Other specified soft tissue disorders: Secondary | ICD-10-CM

## 2021-07-15 DIAGNOSIS — S81801A Unspecified open wound, right lower leg, initial encounter: Secondary | ICD-10-CM

## 2021-07-15 DIAGNOSIS — I872 Venous insufficiency (chronic) (peripheral): Secondary | ICD-10-CM

## 2021-07-15 MED ORDER — TORSEMIDE 20 MG PO TABS
ORAL_TABLET | ORAL | 0 refills | Status: DC
Start: 2021-07-15 — End: 2021-09-21

## 2021-07-15 NOTE — Telephone Encounter (Signed)
I spoke with patient and gave him information from Dr Ellyn Hack. Patient reports he has enough torsemide at home to make change and does not need new prescription sent in at this time.  He will stop by office on 10/27 for lab work.  Instructions on how to weigh daily every AM given to patient.  I told patient I would send message to Dr Allison Quarry nurse to arrange follow up appointment.

## 2021-07-20 ENCOUNTER — Other Ambulatory Visit: Payer: Medicare Other

## 2021-07-22 DIAGNOSIS — M7989 Other specified soft tissue disorders: Secondary | ICD-10-CM | POA: Diagnosis not present

## 2021-07-22 DIAGNOSIS — S81801A Unspecified open wound, right lower leg, initial encounter: Secondary | ICD-10-CM | POA: Diagnosis not present

## 2021-07-22 DIAGNOSIS — I872 Venous insufficiency (chronic) (peripheral): Secondary | ICD-10-CM | POA: Diagnosis not present

## 2021-07-23 ENCOUNTER — Encounter: Payer: Self-pay | Admitting: Oncology

## 2021-07-23 LAB — BASIC METABOLIC PANEL
BUN/Creatinine Ratio: 15 (ref 10–24)
BUN: 36 mg/dL — ABNORMAL HIGH (ref 8–27)
CO2: 23 mmol/L (ref 20–29)
Calcium: 8.6 mg/dL (ref 8.6–10.2)
Chloride: 101 mmol/L (ref 96–106)
Creatinine, Ser: 2.35 mg/dL — ABNORMAL HIGH (ref 0.76–1.27)
Glucose: 121 mg/dL — ABNORMAL HIGH (ref 70–99)
Potassium: 3.8 mmol/L (ref 3.5–5.2)
Sodium: 141 mmol/L (ref 134–144)
eGFR: 27 mL/min/{1.73_m2} — ABNORMAL LOW (ref >59)

## 2021-08-05 ENCOUNTER — Other Ambulatory Visit: Payer: Self-pay

## 2021-08-05 ENCOUNTER — Inpatient Hospital Stay: Admission: RE | Admit: 2021-08-05 | Payer: Medicare Other | Source: Ambulatory Visit

## 2021-08-05 ENCOUNTER — Ambulatory Visit
Admission: RE | Admit: 2021-08-05 | Discharge: 2021-08-05 | Disposition: A | Discharge status: Home / Self Care | Payer: Medicare Other | Source: Ambulatory Visit | Attending: Pulmonary Disease | Admitting: Pulmonary Disease

## 2021-08-05 DIAGNOSIS — R911 Solitary pulmonary nodule: Secondary | ICD-10-CM

## 2021-08-05 DIAGNOSIS — I700 Atherosclerosis of aorta: Secondary | ICD-10-CM | POA: Diagnosis not present

## 2021-08-05 DIAGNOSIS — R918 Other nonspecific abnormal finding of lung field: Secondary | ICD-10-CM | POA: Diagnosis not present

## 2021-08-05 NOTE — Telephone Encounter (Signed)
-----   Message from [name], MD sent at 08/01/2021  9:45 PM EST ----- Shows worsening kidney function with creatinine now 3.35 and the BUN level is up a little bit.  This would suggest worsening renal function potentially due to mild dehydration. ->  Would probably back down on diuretic dose to half half dose for about 2 to 3 days and then reassess.  This could also be related to the recent bladder procedure.  Recommendation would be to cut diuretic doses in half for 3 days and increase oral hydration.  We can recheck labs 1 week after  Glenetta Hew, MD  Need to make sure results get to PCP.

## 2021-08-05 NOTE — Telephone Encounter (Signed)
Left message to call back , also result released to mychart This is the more recent  direction  for torsemide 20 mg tablet (Take 4 tablets in the AM and 3 tablets in the PM alternating with 3 tablets in the AM and 2 tablets in the PM)

## 2021-08-09 ENCOUNTER — Emergency Department (HOSPITAL_COMMUNITY): Payer: Medicare Other

## 2021-08-09 ENCOUNTER — Ambulatory Visit: Payer: Medicare Other | Admitting: Cardiology

## 2021-08-09 ENCOUNTER — Inpatient Hospital Stay (HOSPITAL_COMMUNITY)
Admission: EM | Admit: 2021-08-09 | Discharge: 2021-08-13 | DRG: 291 | Disposition: A | Discharge status: Skilled Nursing Facility | Payer: Medicare Other | Source: Skilled Nursing Facility | Attending: Internal Medicine | Admitting: Internal Medicine

## 2021-08-09 ENCOUNTER — Other Ambulatory Visit: Payer: Self-pay

## 2021-08-09 ENCOUNTER — Encounter (HOSPITAL_COMMUNITY): Payer: Self-pay | Admitting: Internal Medicine

## 2021-08-09 DIAGNOSIS — I272 Pulmonary hypertension, unspecified: Secondary | ICD-10-CM | POA: Diagnosis present

## 2021-08-09 DIAGNOSIS — I5033 Acute on chronic diastolic (congestive) heart failure: Secondary | ICD-10-CM

## 2021-08-09 DIAGNOSIS — R609 Edema, unspecified: Secondary | ICD-10-CM | POA: Diagnosis not present

## 2021-08-09 DIAGNOSIS — Z86010 Personal history of colonic polyps: Secondary | ICD-10-CM

## 2021-08-09 DIAGNOSIS — I739 Peripheral vascular disease, unspecified: Secondary | ICD-10-CM | POA: Diagnosis not present

## 2021-08-09 DIAGNOSIS — Z87442 Personal history of urinary calculi: Secondary | ICD-10-CM

## 2021-08-09 DIAGNOSIS — E876 Hypokalemia: Secondary | ICD-10-CM | POA: Diagnosis present

## 2021-08-09 DIAGNOSIS — C670 Malignant neoplasm of trigone of bladder: Secondary | ICD-10-CM | POA: Diagnosis not present

## 2021-08-09 DIAGNOSIS — R112 Nausea with vomiting, unspecified: Secondary | ICD-10-CM | POA: Diagnosis not present

## 2021-08-09 DIAGNOSIS — I4821 Permanent atrial fibrillation: Secondary | ICD-10-CM | POA: Diagnosis present

## 2021-08-09 DIAGNOSIS — Z9989 Dependence on other enabling machines and devices: Secondary | ICD-10-CM | POA: Diagnosis not present

## 2021-08-09 DIAGNOSIS — Z9221 Personal history of antineoplastic chemotherapy: Secondary | ICD-10-CM | POA: Diagnosis not present

## 2021-08-09 DIAGNOSIS — Z6841 Body Mass Index (BMI) 40.0 and over, adult: Secondary | ICD-10-CM

## 2021-08-09 DIAGNOSIS — K920 Hematemesis: Secondary | ICD-10-CM | POA: Diagnosis not present

## 2021-08-09 DIAGNOSIS — B9689 Other specified bacterial agents as the cause of diseases classified elsewhere: Secondary | ICD-10-CM | POA: Diagnosis present

## 2021-08-09 DIAGNOSIS — R279 Unspecified lack of coordination: Secondary | ICD-10-CM | POA: Diagnosis not present

## 2021-08-09 DIAGNOSIS — K2971 Gastritis, unspecified, with bleeding: Secondary | ICD-10-CM | POA: Diagnosis present

## 2021-08-09 DIAGNOSIS — Z923 Personal history of irradiation: Secondary | ICD-10-CM

## 2021-08-09 DIAGNOSIS — I5042 Chronic combined systolic (congestive) and diastolic (congestive) heart failure: Secondary | ICD-10-CM

## 2021-08-09 DIAGNOSIS — L8990 Pressure ulcer of unspecified site, unspecified stage: Secondary | ICD-10-CM | POA: Insufficient documentation

## 2021-08-09 DIAGNOSIS — I714 Abdominal aortic aneurysm, without rupture, unspecified: Secondary | ICD-10-CM | POA: Diagnosis present

## 2021-08-09 DIAGNOSIS — N1832 Chronic kidney disease, stage 3b: Secondary | ICD-10-CM | POA: Diagnosis present

## 2021-08-09 DIAGNOSIS — R7303 Prediabetes: Secondary | ICD-10-CM | POA: Diagnosis present

## 2021-08-09 DIAGNOSIS — M7989 Other specified soft tissue disorders: Secondary | ICD-10-CM | POA: Diagnosis not present

## 2021-08-09 DIAGNOSIS — I872 Venous insufficiency (chronic) (peripheral): Secondary | ICD-10-CM

## 2021-08-09 DIAGNOSIS — D631 Anemia in chronic kidney disease: Secondary | ICD-10-CM | POA: Diagnosis present

## 2021-08-09 DIAGNOSIS — K209 Esophagitis, unspecified without bleeding: Secondary | ICD-10-CM | POA: Diagnosis not present

## 2021-08-09 DIAGNOSIS — R1110 Vomiting, unspecified: Secondary | ICD-10-CM

## 2021-08-09 DIAGNOSIS — Z800 Family history of malignant neoplasm of digestive organs: Secondary | ICD-10-CM

## 2021-08-09 DIAGNOSIS — Z8551 Personal history of malignant neoplasm of bladder: Secondary | ICD-10-CM

## 2021-08-09 DIAGNOSIS — Z20822 Contact with and (suspected) exposure to covid-19: Secondary | ICD-10-CM | POA: Diagnosis present

## 2021-08-09 DIAGNOSIS — R2689 Other abnormalities of gait and mobility: Secondary | ICD-10-CM | POA: Diagnosis not present

## 2021-08-09 DIAGNOSIS — G4733 Obstructive sleep apnea (adult) (pediatric): Secondary | ICD-10-CM | POA: Diagnosis present

## 2021-08-09 DIAGNOSIS — K2970 Gastritis, unspecified, without bleeding: Secondary | ICD-10-CM | POA: Diagnosis not present

## 2021-08-09 DIAGNOSIS — R262 Difficulty in walking, not elsewhere classified: Secondary | ICD-10-CM | POA: Diagnosis not present

## 2021-08-09 DIAGNOSIS — N136 Pyonephrosis: Secondary | ICD-10-CM | POA: Diagnosis present

## 2021-08-09 DIAGNOSIS — I2510 Atherosclerotic heart disease of native coronary artery without angina pectoris: Secondary | ICD-10-CM | POA: Diagnosis present

## 2021-08-09 DIAGNOSIS — I129 Hypertensive chronic kidney disease with stage 1 through stage 4 chronic kidney disease, or unspecified chronic kidney disease: Secondary | ICD-10-CM | POA: Diagnosis not present

## 2021-08-09 DIAGNOSIS — N1330 Unspecified hydronephrosis: Secondary | ICD-10-CM | POA: Diagnosis not present

## 2021-08-09 DIAGNOSIS — M1990 Unspecified osteoarthritis, unspecified site: Secondary | ICD-10-CM | POA: Diagnosis not present

## 2021-08-09 DIAGNOSIS — R739 Hyperglycemia, unspecified: Secondary | ICD-10-CM | POA: Diagnosis not present

## 2021-08-09 DIAGNOSIS — L89626 Pressure-induced deep tissue damage of left heel: Secondary | ICD-10-CM | POA: Diagnosis present

## 2021-08-09 DIAGNOSIS — E785 Hyperlipidemia, unspecified: Secondary | ICD-10-CM | POA: Diagnosis not present

## 2021-08-09 DIAGNOSIS — K2091 Esophagitis, unspecified with bleeding: Secondary | ICD-10-CM | POA: Diagnosis present

## 2021-08-09 DIAGNOSIS — H9190 Unspecified hearing loss, unspecified ear: Secondary | ICD-10-CM | POA: Diagnosis not present

## 2021-08-09 DIAGNOSIS — Z79899 Other long term (current) drug therapy: Secondary | ICD-10-CM

## 2021-08-09 DIAGNOSIS — Z8719 Personal history of other diseases of the digestive system: Secondary | ICD-10-CM

## 2021-08-09 DIAGNOSIS — G8929 Other chronic pain: Secondary | ICD-10-CM | POA: Diagnosis not present

## 2021-08-09 DIAGNOSIS — Z87891 Personal history of nicotine dependence: Secondary | ICD-10-CM

## 2021-08-09 DIAGNOSIS — N390 Urinary tract infection, site not specified: Secondary | ICD-10-CM | POA: Diagnosis not present

## 2021-08-09 DIAGNOSIS — I517 Cardiomegaly: Secondary | ICD-10-CM | POA: Diagnosis not present

## 2021-08-09 DIAGNOSIS — R41841 Cognitive communication deficit: Secondary | ICD-10-CM | POA: Diagnosis not present

## 2021-08-09 DIAGNOSIS — Z7901 Long term (current) use of anticoagulants: Secondary | ICD-10-CM

## 2021-08-09 DIAGNOSIS — I130 Hypertensive heart and chronic kidney disease with heart failure and stage 1 through stage 4 chronic kidney disease, or unspecified chronic kidney disease: Principal | ICD-10-CM | POA: Diagnosis present

## 2021-08-09 DIAGNOSIS — R531 Weakness: Secondary | ICD-10-CM

## 2021-08-09 DIAGNOSIS — I890 Lymphedema, not elsewhere classified: Secondary | ICD-10-CM | POA: Diagnosis not present

## 2021-08-09 DIAGNOSIS — M6281 Muscle weakness (generalized): Secondary | ICD-10-CM | POA: Diagnosis not present

## 2021-08-09 DIAGNOSIS — Z8249 Family history of ischemic heart disease and other diseases of the circulatory system: Secondary | ICD-10-CM

## 2021-08-09 DIAGNOSIS — C678 Malignant neoplasm of overlapping sites of bladder: Secondary | ICD-10-CM | POA: Diagnosis not present

## 2021-08-09 DIAGNOSIS — L89322 Pressure ulcer of left buttock, stage 2: Secondary | ICD-10-CM | POA: Diagnosis present

## 2021-08-09 DIAGNOSIS — Z743 Need for continuous supervision: Secondary | ICD-10-CM | POA: Diagnosis not present

## 2021-08-09 DIAGNOSIS — R2243 Localized swelling, mass and lump, lower limb, bilateral: Secondary | ICD-10-CM | POA: Diagnosis not present

## 2021-08-09 LAB — URINALYSIS, ROUTINE W REFLEX MICROSCOPIC
Bilirubin Urine: NEGATIVE
Glucose, UA: NEGATIVE mg/dL
Ketones, ur: NEGATIVE mg/dL
Nitrite: POSITIVE — AB
Protein, ur: 30 mg/dL — AB
RBC / HPF: >50 RBC/hpf — ABNORMAL HIGH (ref 0–5)
Specific Gravity, Urine: 1.009 (ref 1.005–1.030)
WBC, UA: >50 WBC/hpf — ABNORMAL HIGH (ref 0–5)
pH: 5 (ref 5.0–8.0)

## 2021-08-09 LAB — COMPREHENSIVE METABOLIC PANEL
ALT: 14 U/L (ref 0–44)
AST: 23 U/L (ref 15–41)
Albumin: 3.4 g/dL — ABNORMAL LOW (ref 3.5–5.0)
Alkaline Phosphatase: 72 U/L (ref 38–126)
Anion gap: 11 (ref 5–15)
BUN: 36 mg/dL — ABNORMAL HIGH (ref 8–23)
CO2: 26 mmol/L (ref 22–32)
Calcium: 8.5 mg/dL — ABNORMAL LOW (ref 8.9–10.3)
Chloride: 100 mmol/L (ref 98–111)
Creatinine, Ser: 2.17 mg/dL — ABNORMAL HIGH (ref 0.61–1.24)
GFR, Estimated: 30 mL/min — ABNORMAL LOW (ref >60)
Glucose, Bld: 158 mg/dL — ABNORMAL HIGH (ref 70–99)
Potassium: 3.2 mmol/L — ABNORMAL LOW (ref 3.5–5.1)
Sodium: 137 mmol/L (ref 135–145)
Total Bilirubin: 1.6 mg/dL — ABNORMAL HIGH (ref 0.3–1.2)
Total Protein: 7.6 g/dL (ref 6.5–8.1)

## 2021-08-09 LAB — CBC
HCT: 29.3 % — ABNORMAL LOW (ref 39.0–52.0)
Hemoglobin: 9.5 g/dL — ABNORMAL LOW (ref 13.0–17.0)
MCH: 29.7 pg (ref 26.0–34.0)
MCHC: 32.4 g/dL (ref 30.0–36.0)
MCV: 91.6 fL (ref 80.0–100.0)
Platelets: 207 10*3/uL (ref 150–400)
RBC: 3.2 MIL/uL — ABNORMAL LOW (ref 4.22–5.81)
RDW: 14.6 % (ref 11.5–15.5)
WBC: 7.7 10*3/uL (ref 4.0–10.5)
nRBC: 0 % (ref 0.0–0.2)

## 2021-08-09 LAB — BRAIN NATRIURETIC PEPTIDE: B Natriuretic Peptide: 217.3 pg/mL — ABNORMAL HIGH (ref 0.0–100.0)

## 2021-08-09 LAB — RESP PANEL BY RT-PCR (FLU A&B, COVID) ARPGX2
Influenza A by PCR: NEGATIVE
Influenza B by PCR: NEGATIVE
SARS Coronavirus 2 by RT PCR: NEGATIVE

## 2021-08-09 LAB — MAGNESIUM: Magnesium: 2.2 mg/dL (ref 1.7–2.4)

## 2021-08-09 MED ORDER — METOPROLOL TARTRATE 25 MG PO TABS: 25.0000 mg | ORAL_TABLET | ORAL | Status: DC

## 2021-08-09 MED ORDER — SODIUM CHLORIDE 0.9 % IV SOLN
1.0000 g | Freq: Once | INTRAVENOUS | Status: AC
  Administered 2021-08-09: 1 g via INTRAVENOUS
  Filled 2021-08-09: qty 10

## 2021-08-09 MED ORDER — POTASSIUM CHLORIDE CRYS ER 20 MEQ PO TBCR
40.0000 meq | EXTENDED_RELEASE_TABLET | Freq: Once | ORAL | Status: AC
  Administered 2021-08-09: 40 meq via ORAL
  Filled 2021-08-09: qty 2

## 2021-08-09 MED ORDER — FUROSEMIDE 10 MG/ML IJ SOLN
40.0000 mg | Freq: Once | INTRAMUSCULAR | Status: AC
  Administered 2021-08-09: 40 mg via INTRAVENOUS
  Filled 2021-08-09: qty 4

## 2021-08-09 MED ORDER — POTASSIUM CHLORIDE CRYS ER 20 MEQ PO TBCR
40.0000 meq | EXTENDED_RELEASE_TABLET | Freq: Every day | ORAL | Status: DC
  Administered 2021-08-10 – 2021-08-11 (×2): 40 meq via ORAL
  Filled 2021-08-09 (×2): qty 2

## 2021-08-09 MED ORDER — PROCHLORPERAZINE MALEATE 10 MG PO TABS
10.0000 mg | ORAL_TABLET | Freq: Four times a day (QID) | ORAL | Status: DC | PRN
  Administered 2021-08-10 – 2021-08-12 (×2): 10 mg via ORAL
  Filled 2021-08-09 (×2): qty 1

## 2021-08-09 MED ORDER — ACETAMINOPHEN 325 MG PO TABS
650.0000 mg | ORAL_TABLET | Freq: Four times a day (QID) | ORAL | Status: DC | PRN
  Administered 2021-08-10: 650 mg via ORAL
  Filled 2021-08-09 (×2): qty 2

## 2021-08-09 MED ORDER — SODIUM CHLORIDE 0.9 % IV SOLN
2.0000 g | INTRAVENOUS | Status: DC
  Administered 2021-08-10 – 2021-08-12 (×3): 2 g via INTRAVENOUS
  Filled 2021-08-09 (×3): qty 20

## 2021-08-09 MED ORDER — METOPROLOL TARTRATE 25 MG PO TABS
25.0000 mg | ORAL_TABLET | Freq: Every day | ORAL | Status: DC
  Administered 2021-08-10 – 2021-08-13 (×4): 25 mg via ORAL
  Filled 2021-08-09 (×4): qty 1

## 2021-08-09 MED ORDER — CEPHALEXIN 500 MG PO CAPS: 500.0000 mg | ORAL_CAPSULE | Freq: Three times a day (TID) | ORAL | 0 refills | Status: DC

## 2021-08-09 MED ORDER — RIVAROXABAN 20 MG PO TABS
20.0000 mg | ORAL_TABLET | Freq: Every day | ORAL | Status: DC
  Administered 2021-08-09 – 2021-08-10 (×2): 20 mg via ORAL
  Filled 2021-08-09 (×2): qty 1

## 2021-08-09 MED ORDER — TAMSULOSIN HCL 0.4 MG PO CAPS
0.4000 mg | ORAL_CAPSULE | Freq: Every day | ORAL | Status: DC
  Administered 2021-08-10 – 2021-08-13 (×4): 0.4 mg via ORAL
  Filled 2021-08-09 (×3): qty 1

## 2021-08-09 MED ORDER — FUROSEMIDE 10 MG/ML IJ SOLN
40.0000 mg | Freq: Every day | INTRAMUSCULAR | Status: DC
  Administered 2021-08-10: 40 mg via INTRAVENOUS
  Filled 2021-08-09: qty 4

## 2021-08-09 MED ORDER — METOPROLOL TARTRATE 25 MG PO TABS
37.5000 mg | ORAL_TABLET | Freq: Every day | ORAL | Status: DC
  Administered 2021-08-09 – 2021-08-12 (×3): 37.5 mg via ORAL
  Filled 2021-08-09 (×4): qty 2

## 2021-08-09 MED ORDER — ATORVASTATIN CALCIUM 10 MG PO TABS
10.0000 mg | ORAL_TABLET | Freq: Every day | ORAL | Status: DC
  Administered 2021-08-10 – 2021-08-13 (×3): 10 mg via ORAL
  Filled 2021-08-09 (×3): qty 1

## 2021-08-09 MED ORDER — ACETAMINOPHEN 650 MG RE SUPP: 650.0000 mg | Freq: Four times a day (QID) | RECTAL | Status: DC | PRN

## 2021-08-09 MED ORDER — DOCUSATE SODIUM 100 MG PO CAPS
100.0000 mg | ORAL_CAPSULE | Freq: Two times a day (BID) | ORAL | Status: DC
  Administered 2021-08-09 – 2021-08-13 (×6): 100 mg via ORAL
  Filled 2021-08-09 (×6): qty 1

## 2021-08-09 NOTE — Discharge Instructions (Addendum)
It was our pleasure to provide your ER care today - we hope that you feel better.  Your lab tests show a urine infection - make sure to stay adequately hydrated, and take antibiotic (keflex) as prescribed. Take one extra dose of your fluid pill (torsemide) tomorrow.   For leg swelling, continue your home meds, keep legs elevated as much as possible (above heart level), limit salt intake, and follow up with your doctor in the coming week.   Return to ER if worse, new symptoms, fevers, vomiting, new/severe pain, increased trouble breathing, or other concern.

## 2021-08-09 NOTE — ED Notes (Signed)
Family and patient updated as to patient's status.

## 2021-08-09 NOTE — Progress Notes (Signed)
Pt refused CPAP qhs.  Pt states that he does not use it at home and does not want to wear it while in the hospital.

## 2021-08-09 NOTE — ED Provider Notes (Addendum)
[facility] DEPT Provider Note   CSN: [id number] Arrival date & time: 08/09/21  9892     History Chief Complaint  Patient presents with   Leg Swelling    Patrick Bishop is a 80 y.o. male.  Patient c/o bilateral leg swelling in the past couple weeks. Symptoms gradual onset, moderate-severe, constant, persistent, slowly worse. Has hx chronic venous stasis and CKD. Was evaluated by cardiology for same, had diuretic dose increased, but then developed AKI on labs, and diuretic dose backed down. +doe - but not very active at baseline, and now says not ambulatory in past few days. Had appt with cardiology today but cancelled it and here in ED instead. No chest pain or discomfort. No fever or chills. Compliant w home meds. Feels is making normal amount urine. ?orthopnea. No pnd. Patient states whether due to leg swelling, or other cause, he is now too weak to walk/cant walk, lives in independent living situation.   The history is provided by the patient, medical records and the EMS personnel.      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  Also s/p Bilateral GSV Ablation.   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   Pre-op evaluation 09/28/2020   Goals of care, counseling/discussion 08/27/2019   Pneumonia due to COVID-19 virus 08/05/2019   Pneumonia 08/03/2019   Suspected COVID-19 virus infection    Shortness of breath 08/02/2019   Leukopenia    Heme + stool    Decreased hemoglobin    Hydronephrosis with obstructing calculus 06/24/2019   Cholelithiasis 06/24/2019   Arthritis 07/12/2018   Chronic pain 07/12/2018   Hearing loss 07/12/2018   Lymphedema 07/12/2018   Muscle weakness 07/12/2018   Unsteady gait 07/12/2018   Morbid obesity ([place]) 07/08/2018   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: Dr. Cleda Mccreedy): (prior to Bilateral Iliac-Femoral Venous Stent Placement): Patent IVC.  No DVT B/L Iliac or Com fem V..  +++ DEEP V REFLUX B/L ILIAC & FEM V.  Absent Superficial  V Reflux B/L x L SSV (too tortuous for intervention).  BL GSV occluded - prior ablation.    ABDOMINAL VENOUS DUPLEX Bilateral 11/2014   Absent DVT.  Left common femoral venous reflux: 1508 MS left common femoral vein, 2284MS left proximal femoral vein -> cystic structure noted in the left popliteal vein (7.6 x 1.4 x 3.8 cm).;  Right leg deep venous reflux noted in the distal iliac, common femoral, proximal profunda femoral, proximal superficial femoral, superficial femoral and popliteal vein.   CARDIAC CATHETERIZATION  10/2009   [facility] - Dr. Saverio Danker): In response to abnormal cardiac PET --> mild nonobstructive CAD: LAD  20%, RCA 30%.  EF~45%.  Mild pulmonary pretension.   CARDIAC PET  08/2009   Inferolateral partially reversible defect -- > false positive by cath   COLONOSCOPY     CYSTOSCOPY W/ URETERAL STENT PLACEMENT Left 01/19/2021   Procedure: CYSTOSCOPY WITH STENT EXCHANGE;  Surgeon: Ceasar Mons, MD;  Location: [facility];  Service: Urology;  Laterality: Left;  ONLY NEEDS 30 MIN   CYSTOSCOPY WITH STENT PLACEMENT Left 05/12/2021   Procedure: CYSTOSCOPY WITH LEFT STENT EXCHANGE;  Surgeon: Ceasar Mons, MD;  Location: [facility];  Service: Urology;  Laterality: Left;  ONLY NEEDS 30 MIN   CYSTOSCOPY/URETEROSCOPY/HOLMIUM LASER/STENT PLACEMENT Left 11/06/2019   Procedure: CYSTOSCOPY/RETROGRADE/URETEROSCOPY/HOLMIUM LASER/STENT PLACEMENT. REMOVAL NEPHROSCOPYTUBE;  Surgeon: Ceasar Mons, MD;  Location: [facility];  Service: Urology;  Laterality: Left;   ESOPHAGOGASTRODUODENOSCOPY     GREATER SAPHENOUS VEIN ABLATION Bilateral Before 2015   [facility] (Dr. Eugenie Filler)   IR NEPHROSTOMY EXCHANGE LEFT  08/28/2019   IR NEPHROSTOMY PLACEMENT LEFT  06/24/2019   IR TRANSCATH PLC STENT  EA ADD VEIN  INC ANGIOPLASTY Left 10/2014   ([place], Dr. 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 Stormy Fabian): US Guided --> R Common & External Iliac, Common Femoral Venography with Placement of 24 mmx 70 mm Stent - R Com Iliac-Ext Iliac- & Com Fem V. reducing 60-80% venous compression to ~0%).;; also noted significant L-sided venous compression (staged stenting).    LIPOMA EXCISION     x 2 came back again   NM MYOVIEW LTD  11/2005   Adenosine Myoview [facility] - Dr. Cleda Mccreedy): Normal myocardial perfusion scan.  No ischemia or infarction.   TRANSTHORACIC ECHOCARDIOGRAM  04/26/2017    [facility] - Dr. Cleda Mccreedy) - report not available.  Per clinic note: Normal global LV function. Mild concentric temperature. Mild TR. Mild pulmonary hypertension.;;    TRANSTHORACIC ECHOCARDIOGRAM  7/'15; 7/'17   [facility] - Dr. Saverio Danker) a) low normal LV function (EF 56%). Moderate concentric LVH. Mild LA dilation. Normal pulmonary pressures.  ;; b) normal global function - EF 60-65%. Moderate pulmonary hypertension. Biatrial enlargement. Moderate concentric LVH.   TRANSTHORACIC ECHOCARDIOGRAM  02/2010   [facility] - Dr. Saverio Danker): Normal global LV function. Mild LVH. EF 60%.; November 2010. Moderate LV dilation. Moderate LVH. Normal EF 50-55%. Mild to moderate pulmonary hypertension. (July 2012 showed normal function with mild LVH, mild MR noted in August 2014)   TRANSTHORACIC ECHOCARDIOGRAM  05/2019   During admission for UTI, sepsis with A.  fib RVR: LV normal size and function.  EF 60-65%.  Mildly reduced RV size with severely reduced function.  Despite this normal atrial size noted with just only mild right atrial enlargement with elevated RAP (dilated IVC).  Relatively poor TR jet, but findings do suggest elevated RV pressures.   TRANSURETHRAL RESECTION OF BLADDER TUMOR Left 10/21/2020   Procedure: TRANSURETHRAL RESECTION OF BLADDER TUMOR (TURBT)/ RETROGRADE/  URETERAL STENT PLACEMENT;  Surgeon: Ceasar Mons, MD;  Location: [facility];  Service: Urology;  Laterality: Left;       Family History  Problem Relation Age of Onset   Colon cancer Mother    Heart attack Father 51   Hypertension Brother    Cancer Sister    Colon cancer Sister    Other Neg Hx        He really does not know much about his parents and siblings health, but they did not speak about healthcare    Social History   Tobacco Use   Smoking status: Former    Packs/day: 1.00    Years: 35.00    Pack  years: 35.00    Types: Cigarettes    Quit date: 1996    Years since quitting: 26.8   Smokeless tobacco: Never   Tobacco comments:    smoked from age [age]   Vaping Use   Vaping Use: Never used  Substance Use Topics   Alcohol use: Yes    Comment: occasional   Drug use: No    Home Medications Prior to Admission medications   Medication Sig Start Date End Date Taking? Authorizing Provider  atorvastatin (LIPITOR) 10 MG tablet TAKE 1 TABLET DAILY Patient taking differently: Take 10 mg by mouth daily. 11/10/20   [name], MD  HYDROcodone-acetaminophen (NORCO/VICODIN) 5-325 MG tablet Take 1 tablet by mouth 2 (two) times daily as needed for moderate pain. 07/02/19   Geradine Girt, DO  loperamide (IMODIUM A-D) 2 MG tablet Take 2-4 mg by mouth 4 (four) times daily as needed for diarrhea or loose stools.    [provider]  metoprolol tartrate (LOPRESSOR) 25 MG tablet Take 25-37.5 mg by mouth See admin instructions. Take 25 mg in the morning and 37.5 mg in the evening 01/08/21   [provider]  prochlorperazine (COMPAZINE) 10 MG tablet TAKE 1 TABLET(10 MG) BY MOUTH EVERY 6 HOURS AS NEEDED FOR NAUSEA OR VOMITING Patient taking differently: Take 10 mg by mouth every 6 (six) hours as needed for nausea or vomiting. 12/14/20   [name], MD  tamsulosin (FLOMAX) 0.4 MG CAPS capsule Take 0.4 mg by mouth daily.    [provider]  torsemide (DEMADEX) 20 MG tablet Take 4 tablets in the AM and 3 tablets in the PM alternating with 3 tablets in the AM and 2 tablets in the PM 07/15/21   [name], MD  XARELTO 20 MG TABS tablet TAKE 1 TABLET DAILY WITH SUPPER Patient taking differently: Take 20 mg by mouth daily with supper. 11/16/20   [name], MD    Allergies    Aldactone [spironolactone]  Review of Systems   Review of Systems  Constitutional:  Negative for fever.  HENT:  Negative for sore throat.   Eyes:  Negative for redness.  Respiratory:   Negative for cough.   Cardiovascular:  Positive for leg swelling. Negative for chest pain and palpitations.  Gastrointestinal:  Negative for abdominal pain.  Genitourinary:  Negative for flank pain.  Musculoskeletal:  Negative for back pain and neck pain.  Skin:  Negative for rash.  Neurological:  Positive for weakness. Negative for speech difficulty, numbness and headaches.  Hematological:  Does not bruise/bleed easily.  Psychiatric/Behavioral:  Negative for confusion.    Physical Exam Updated Vital Signs BP 122/77   Pulse 74   Temp 98.2 F (36.8 C) (Oral)   Resp 20   Ht 1.854 m (6\' 1" )   Wt (!) 142 kg   SpO2 95%   BMI 41.30 kg/m   Physical Exam Vitals and nursing note reviewed.  Constitutional:      Appearance: Normal appearance. He is well-developed.  HENT:     Head: Atraumatic.     Nose: Nose normal.     Mouth/Throat:     Mouth: Mucous membranes are moist.     Pharynx: Oropharynx is clear.  Eyes:     General: No scleral icterus.    Conjunctiva/sclera: Conjunctivae normal.  Neck:     Trachea: No tracheal deviation.  Cardiovascular:     Rate and Rhythm: Normal rate and regular rhythm.     Pulses: Normal pulses.     Heart sounds: Normal heart sounds. No murmur heard.   No friction rub. No gallop.  Pulmonary:     Effort: Pulmonary effort is normal. No accessory muscle usage or respiratory distress.     Breath sounds: Normal breath sounds.  Abdominal:     General: Bowel sounds are normal. There is no distension.     Palpations: Abdomen is soft.     Tenderness: There is no abdominal tenderness. There is no guarding.     Comments: Obese  Genitourinary:    Comments: No cva tenderness. Musculoskeletal:        General: Swelling present.     Cervical back: Normal range of motion and neck supple. No rigidity.     Right lower leg: Edema present.     Left lower leg: Edema present.     Comments: Severe bilateral foot, ankle, and leg swelling bilateral. Skin changes c/w  chronic venous stasis.   Skin:    General: Skin is warm and dry.     Findings: No rash.  Neurological:     Mental Status: He is alert.     Comments: Alert, speech clear.   Psychiatric:        Mood and Affect: Mood normal.    ED Results / Procedures / Treatments   Labs  (all labs ordered are listed, but only abnormal results are displayed) Results for orders placed or performed during the hospital encounter of 08/09/21  Resp Panel by RT-PCR (Flu A&B, Covid) Nasopharyngeal Swab   Specimen: Nasopharyngeal Swab; Nasopharyngeal(NP) swabs in vial transport medium  Result Value Ref Range   SARS Coronavirus 2 by RT PCR NEGATIVE NEGATIVE   Influenza A by PCR NEGATIVE NEGATIVE   Influenza B by PCR NEGATIVE NEGATIVE  CBC  Result Value Ref Range   WBC 7.7 4.0 - 10.5 K/uL   RBC 3.20 (L) 4.22 - 5.81 MIL/uL   Hemoglobin 9.5 (L) 13.0 - 17.0 g/dL   HCT 29.3 (L) 39.0 - 52.0 %   MCV 91.6 80.0 - 100.0 fL   MCH 29.7 26.0 - 34.0 pg   MCHC 32.4 30.0 - 36.0 g/dL   RDW 14.6 11.5 - 15.5 %   Platelets 207 150 - 400 K/uL   nRBC 0.0 0.0 - 0.2 %  Comprehensive metabolic panel  Result Value Ref Range   Sodium 137 135 -  145 mmol/L   Potassium 3.2 (L) 3.5 - 5.1 mmol/L   Chloride 100 98 - 111 mmol/L   CO2 26 22 - 32 mmol/L   Glucose, Bld 158 (H) 70 - 99 mg/dL   BUN 36 (H) 8 - 23 mg/dL   Creatinine, Ser 2.17 (H) 0.61 - 1.24 mg/dL   Calcium 8.5 (L) 8.9 - 10.3 mg/dL   Total Protein 7.6 6.5 - 8.1 g/dL   Albumin 3.4 (L) 3.5 - 5.0 g/dL   AST 23 15 - 41 U/L   ALT 14 0 - 44 U/L   Alkaline Phosphatase 72 38 - 126 U/L   Total Bilirubin 1.6 (H) 0.3 - 1.2 mg/dL   GFR, Estimated 30 (L) >60 mL/min   Anion gap 11 5 - 15  Brain natriuretic peptide  Result Value Ref Range   B Natriuretic Peptide 217.3 (H) 0.0 - 100.0 pg/mL  Urinalysis, Routine w reflex microscopic Urine, Clean Catch  Result Value Ref Range   Color, Urine YELLOW YELLOW   APPearance CLOUDY (A) CLEAR   Specific Gravity, Urine 1.009 1.005 -  1.030   pH 5.0 5.0 - 8.0   Glucose, UA NEGATIVE NEGATIVE mg/dL   Hgb urine dipstick LARGE (A) NEGATIVE   Bilirubin Urine NEGATIVE NEGATIVE   Ketones, ur NEGATIVE NEGATIVE mg/dL   Protein, ur 30 (A) NEGATIVE mg/dL   Nitrite POSITIVE (A) NEGATIVE   Leukocytes,Ua LARGE (A) NEGATIVE   RBC / HPF >50 (H) 0 - 5 RBC/hpf   WBC, UA >50 (H) 0 - 5 WBC/hpf   Bacteria, UA MANY (A) NONE SEEN  Magnesium  Result Value Ref Range   Magnesium 2.2 1.7 - 2.4 mg/dL   DG Chest Port 1 View  Result Date: 08/09/2021 CLINICAL DATA:  Swelling EXAM: PORTABLE CHEST 1 VIEW COMPARISON:  08/02/2019 FINDINGS: Cardiomegaly. Both lungs are clear. The visualized skeletal structures are unremarkable. IMPRESSION: Cardiomegaly without acute abnormality of the lungs in AP portable projection. Electronically Signed   By: Delanna Ahmadi M.D.   On: 08/09/2021 09:18   CT Super D Chest Wo Contrast  Result Date: 08/06/2021 CLINICAL DATA:  Lung nodule, follow-up. EXAM: CT CHEST WITHOUT CONTRAST TECHNIQUE: Multidetector CT imaging of the chest was performed using thin slice collimation for electromagnetic bronchoscopy planning purposes, without intravenous contrast. COMPARISON:  05/24/2021 FINDINGS: Cardiovascular: Heart size is enlarged. There is no pericardial effusion. Aortic atherosclerosis. Coronary artery calcifications. Mediastinum/Nodes: No enlarged mediastinal, hilar, or axillary lymph nodes. Thyroid gland, trachea, and esophagus demonstrate no significant findings. Lungs/Pleura: There is marked asymmetric elevation of the right hemidiaphragm, unchanged from previous exam. Overlying atelectatic changes are noted which along with respiratory motion artifact obscure the suspected, central peribronchovascular right middle lobe lung nodule. This measures approximately 1.5 x 1.1 cm, image 81/3. A corresponding nodule is not confidently identified on the coronal reformatted images. Unchanged appearance of small perifissural nodules along  the major and minor fissure, image 73/3 and image 72/3. Right upper lobe lung nodule measures 4 mm, image 65/3. Not significantly changed in the interval. Upper Abdomen: No acute abnormality. Left nephroureteral stent is partially visualized. Gallstones noted within the neck of gallbladder. Aortic atherosclerosis. 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 Multilevel disc space narrowing and endplate spurring identified throughout the thoracic spine. IMPRESSION: 1. Stable appearance of marked asymmetric elevation of the right hemidiaphragm with overlying atelectatic changes. 2. Again seen is a suggestion of  a central, peribronchovascular nodule within the right middle lobe. However, given the respiratory motion artifact noted on this exam as well as the atelectatic changes and asymmetric elevation of the right hemidiaphragm a definitive nodule within this area cannot be confirmed with a high degree of certainty. Furthermore there is no corresponding nodular density identified on the coronal reformatted images. 3. Additional small right lung nodules are stable. 4. Gallstones. 5. Large left chest wall lipoma. 6. Aortic Atherosclerosis (ICD10-I70.0). Electronically Signed   By: Kerby Moors M.D.   On: 08/06/2021 13:30    EKG None  Radiology No results found.  Procedures Procedures   Medications Ordered in ED Medications - No data to display  ED Course  I have reviewed the triage vital signs and the nursing notes.  Pertinent labs & imaging results that were available during my care of the patient were reviewed by me and considered in my medical decision making (see chart for details).    MDM Rules/Calculators/A&P                           Labs sent. Iv. Cxr.   Reviewed nursing notes and prior charts for additional history.   Labs reviewed/interpreted by me - wbc  normal. CKD, c/w prior. K mildly low, kcl po. Additional labs reviewed/interpreted by me - ua c/w uti. Culture sent. Iv abx.   CXR reviewed/interpreted by me - no edema or pna.   Dose of diuretic given iv - lasix iv.   Recheck good urine output.  ?whether uti related to/causing increased weakness, or due to progressive deconditioning/ftt in setting of uti, but as unable to stand/walk, generally weak, w uti, and increased leg edema, will admit to hospitalist.   Hospitalists consulted for admission.      Final Clinical Impression(s) / ED Diagnoses Final diagnoses:  None    Rx / DC Orders ED Discharge Orders     None            Lajean Saver, MD 08/09/21 1341

## 2021-08-09 NOTE — H&P (Signed)
History and Physical    Edmon Magid KYH:[id number] DOB: [birth year] DOA: 08/09/2021  PCP: Lujean Amel, MD  Patient coming from: Home  Chief Complaint: BLE weakness  HPI: Patrick Bishop is a 80 y.o. male with medical history significant of a fib on xarelto, diastolic HF, HTN, bladder cancer.  Presenting with BLE weakness and increased swelling. He has had increased swelling in his legs for a few weeks. He has been following with cardiology for this. They have adjusted medications a couple of time, but have not been able to dial things into an even state. He has noticed over the last 4 days his legs have really worsened. They are much heavier and harder for him to move. He notes they have been weeping clear fluid. While not overly active at baseline, he has noticed that his is not able to ambulate much at all now that his legs are this swollen. He became concerned and came to the ED for help. He denies any other aggravating or alleviating factors.   ED Course: He was noted to be volume overloaded. He was given lasix. He was given K+ replacement. UA was concerning for UTI. He was started on rocephin. TRH was called for admission.   Review of Systems:  Denies CP, dyspnea, abdominal pain, N/V/D, fever, sick contacts, syncopal episodes, seizure-like activity. Review of systems is otherwise negative for all not mentioned in HPI.   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  Also s/p Bilateral GSV Ablation.   Dyspnea    Essential hypertension    History of kidney stones    Hx of adenomatous colonic polyps    Hyperlipidemia with target LDL less than 100    Longstanding  persistent atrial fibrillation ([place])    Nephrolithiasis 05/2019   CKD stage 3   Obesity    OSA on CPAP    does not cpap    Pneumonia 08/2019   Pre-diabetes    Pulmonary hypertension ([place])    Related to OSA, partial component of HFpEF   Pyelonephritis 05/2019    PSHx Past Surgical History:  Procedure Laterality Date   ABDOMINAL VENOUS DUPLEX Bilateral 07/2014   ([place]: Dr. Cleda Mccreedy): (prior to Bilateral Iliac-Femoral Venous Stent Placement): Patent IVC.  No DVT B/L Iliac or Com fem V..  +++ DEEP V REFLUX B/L ILIAC & FEM V.  Absent Superficial V Reflux B/L x L SSV (too tortuous for intervention).  BL GSV occluded - prior ablation.    ABDOMINAL VENOUS DUPLEX Bilateral 11/2014   Absent DVT.  Left common femoral venous reflux: 1508 MS left common femoral vein, 2284MS left proximal femoral vein -> cystic structure noted in the left popliteal vein (7.6 x 1.4 x 3.8 cm).;  Right leg deep venous reflux noted in the distal iliac, common femoral, proximal profunda femoral, proximal superficial femoral, superficial femoral and popliteal vein.   CARDIAC CATHETERIZATION  10/2009   [facility] - Dr. 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  EF~45%.  Mild pulmonary pretension.   CARDIAC PET  08/2009   Inferolateral partially reversible  defect -- > false positive by cath   COLONOSCOPY     CYSTOSCOPY W/ URETERAL STENT PLACEMENT Left 01/19/2021   Procedure: CYSTOSCOPY WITH STENT EXCHANGE;  Surgeon: Ceasar Mons, MD;  Location: [facility];  Service: Urology;  Laterality: Left;  ONLY NEEDS 30 MIN   CYSTOSCOPY WITH STENT PLACEMENT Left 05/12/2021   Procedure: CYSTOSCOPY WITH LEFT STENT EXCHANGE;  Surgeon: Ceasar Mons, MD;  Location: [facility];  Service: Urology;  Laterality: Left;  ONLY NEEDS 30 MIN   CYSTOSCOPY/URETEROSCOPY/HOLMIUM LASER/STENT PLACEMENT Left 11/06/2019   Procedure:  CYSTOSCOPY/RETROGRADE/URETEROSCOPY/HOLMIUM LASER/STENT PLACEMENT. REMOVAL NEPHROSCOPYTUBE;  Surgeon: Ceasar Mons, MD;  Location: [facility];  Service: Urology;  Laterality: Left;   ESOPHAGOGASTRODUODENOSCOPY     GREATER SAPHENOUS VEIN ABLATION Bilateral Before 2015   [facility] (Dr. Eugenie Filler)   IR NEPHROSTOMY EXCHANGE LEFT  08/28/2019   IR NEPHROSTOMY PLACEMENT LEFT  06/24/2019   IR TRANSCATH PLC STENT  EA ADD VEIN  INC ANGIOPLASTY Left 10/2014   ([place], Dr. 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 Stormy Fabian): US Guided --> R Common & External Iliac, Common Femoral Venography with Placement of 24 mmx 70 mm Stent - R Com Iliac-Ext Iliac- & Com Fem V. reducing 60-80% venous compression to ~0%).;; also noted significant L-sided venous compression (staged stenting).    LIPOMA EXCISION     x 2 came back again   NM MYOVIEW LTD  11/2005   [facility] - Dr. Cleda Mccreedy): Normal myocardial perfusion scan. No ischemia or infarction.   TRANSTHORACIC ECHOCARDIOGRAM  04/26/2017    [facility] - Dr. Cleda Mccreedy) - report not available.  Per clinic note: Normal global LV function. Mild concentric temperature. Mild TR. Mild pulmonary hypertension.;;    TRANSTHORACIC ECHOCARDIOGRAM  7/'15; 7/'17   [facility] - Dr. Saverio Danker) a) low normal LV function (EF 56%). Moderate concentric LVH. Mild LA dilation. Normal pulmonary pressures.  ;; b) normal global function - EF 60-65%. Moderate pulmonary hypertension. Biatrial enlargement. Moderate concentric LVH.   TRANSTHORACIC ECHOCARDIOGRAM  02/2010   [facility] - Dr. Saverio Danker): Normal global LV function. Mild  LVH. EF 60%.; November 2010. Moderate LV dilation. Moderate LVH. Normal EF 50-55%. Mild to moderate pulmonary hypertension. (July 2012 showed normal function with mild LVH, mild MR noted in August 2014)   TRANSTHORACIC ECHOCARDIOGRAM  05/2019   During admission for UTI, sepsis with A. fib RVR: LV normal size and function.  EF 60-65%.  Mildly reduced RV size with severely reduced function.  Despite this normal atrial size noted with just only mild right atrial enlargement with elevated RAP (dilated IVC).  Relatively poor TR jet, but findings do suggest elevated RV pressures.   TRANSURETHRAL RESECTION OF BLADDER TUMOR Left 10/21/2020   Procedure: TRANSURETHRAL RESECTION OF BLADDER TUMOR (TURBT)/ RETROGRADE/  URETERAL STENT PLACEMENT;  Surgeon: Ceasar Mons, MD;  Location: [facility];  Service: Urology;  Laterality: Left;    SocHx  reports that he quit smoking about 26 years ago. His smoking use included cigarettes. He has a 35.00 pack-year smoking history. He has never used smokeless tobacco. He reports current alcohol use. He reports that he  does not use drugs.  Allergies  Allergen Reactions   Aldactone [Spironolactone] Other (See Comments)    unknown     FamHx Family History  Problem Relation Age of Onset   Colon cancer Mother    Heart attack Father 41   Hypertension Brother    Cancer Sister    Colon cancer Sister    Other Neg Hx        He really does not know much about his parents and siblings health, but they did not speak about healthcare    Prior to Admission medications   Medication Sig Start Date End Date Taking? Authorizing Provider  atorvastatin (LIPITOR) 10 MG tablet TAKE 1 TABLET DAILY Patient taking differently: Take 10 mg by mouth daily. 11/10/20  Yes [name], MD  cephALEXin (KEFLEX) 500 MG capsule Take 1 capsule (500 mg total) by mouth 3 (three) times daily. 08/09/21  Yes Lajean Saver, MD  loperamide (IMODIUM A-D) 2 MG tablet Take 2-4 mg by mouth 4  (four) times daily as needed for diarrhea or loose stools.   Yes [provider]  metoprolol tartrate (LOPRESSOR) 25 MG tablet Take 25-37.5 mg by mouth See admin instructions. Take 25 mg in the morning and 37.5 mg in the evening 01/08/21  Yes [provider]  prochlorperazine (COMPAZINE) 10 MG tablet TAKE 1 TABLET(10 MG) BY MOUTH EVERY 6 HOURS AS NEEDED FOR NAUSEA OR VOMITING Patient taking differently: Take 10 mg by mouth every 6 (six) hours as needed for nausea or vomiting. 12/14/20  Yes [name], MD  tamsulosin (FLOMAX) 0.4 MG CAPS capsule Take 0.4 mg by mouth daily.   Yes [provider]  torsemide (DEMADEX) 20 MG tablet Take 4 tablets in the AM and 3 tablets in the PM alternating with 3 tablets in the AM and 2 tablets in the PM Patient taking differently: Take 40-80 mg by mouth See admin instructions. Take 4 tablets in the AM and 3 tablets in the PM alternating with 3 tablets in the AM and 2 tablets in the PM 07/15/21  Yes [name], MD  XARELTO 20 MG TABS tablet TAKE 1 TABLET DAILY WITH SUPPER Patient taking differently: Take 20 mg by mouth daily with supper. 11/16/20  Yes [name], MD    Physical Exam: Vitals:   08/09/21 0845 08/09/21 1019 08/09/21 1300 08/09/21 1345  BP:  119/65 118/66 115/72  Pulse:  92 87 88  Resp:  19 20 (!) 27  Temp: 98.2 F (36.8 C)     TempSrc: Oral     SpO2:  98% 92% 95%  Weight:      Height:        General: 80 y.o. male resting in bed in NAD Eyes: PERRL, normal sclera ENMT: Nares patent w/o discharge, orophaynx clear, dentition normal, ears w/o discharge/lesions/ulcers Neck: Supple, trachea midline Cardiovascular: RRR, +S1, S2, no m/g/r, equal pulses throughout Respiratory: CTABL, no w/r/r, normal WOB GI: BS+, NDNT, obese, no masses noted, no organomegaly noted MSK: BLE 4+ edema w/ weeping; chronic venous changes b/l; no c/c Neuro: A&O x 3, other than HoH, no focal deficits Psyc: Appropriate interaction  and affect, calm/cooperative  Labs on Admission: I have personally reviewed following labs and imaging studies  CBC: Recent Labs  Lab 08/09/21 0924  WBC 7.7  HGB 9.5*  HCT 29.3*  MCV 91.6  PLT 195   Basic Metabolic Panel: Recent Labs  Lab 08/09/21 0924  NA 137  K 3.2*  CL 100  CO2 26  GLUCOSE 158*  BUN 36*  CREATININE 2.17*  CALCIUM 8.5*  MG 2.2   GFR: Estimated Creatinine Clearance: 39.5 mL/min (A) (by C-G formula based on SCr of 2.17 mg/dL (H)). Liver Function Tests: Recent Labs  Lab 08/09/21 0924  AST 23  ALT 14  ALKPHOS 72  BILITOT 1.6*  PROT 7.6  ALBUMIN 3.4*   No results for input(s): LIPASE, AMYLASE in the last 168 hours. No results for input(s): AMMONIA in the last 168 hours. Coagulation Profile: No results for input(s): INR, PROTIME in the last 168 hours. Cardiac Enzymes: No results for input(s): CKTOTAL, CKMB, CKMBINDEX, TROPONINI in the last 168 hours. BNP (last 3 results) No results for input(s): PROBNP in the last 8760 hours. HbA1C: No results for input(s): HGBA1C in the last 72 hours. CBG: No results for input(s): GLUCAP in the last 168 hours. Lipid Profile: No results for input(s): CHOL, HDL, LDLCALC, TRIG, CHOLHDL, LDLDIRECT in the last 72 hours. Thyroid Function Tests: No results for input(s): TSH, T4TOTAL, FREET4, T3FREE, THYROIDAB in the last 72 hours. Anemia Panel: No results for input(s): VITAMINB12, FOLATE, FERRITIN, TIBC, IRON, RETICCTPCT in the last 72 hours. Urine analysis:    Component Value Date/Time   COLORURINE YELLOW 08/09/2021 1017   APPEARANCEUR CLOUDY (A) 08/09/2021 1017   LABSPEC 1.009 08/09/2021 1017   PHURINE 5.0 08/09/2021 1017   GLUCOSEU NEGATIVE 08/09/2021 1017   HGBUR LARGE (A) 08/09/2021 1017   BILIRUBINUR NEGATIVE 08/09/2021 [place] 08/09/2021 1017   PROTEINUR 30 (A) 08/09/2021 1017   NITRITE POSITIVE (A) 08/09/2021 1017   LEUKOCYTESUR LARGE (A) 08/09/2021 1017    Radiological Exams  on Admission: DG Chest Port 1 View  Result Date: 08/09/2021 CLINICAL DATA:  Swelling EXAM: PORTABLE CHEST 1 VIEW COMPARISON:  08/02/2019 FINDINGS: Cardiomegaly. Both lungs are clear. The visualized skeletal structures are unremarkable. IMPRESSION: Cardiomegaly without acute abnormality of the lungs in AP portable projection. Electronically Signed   By: Delanna Ahmadi M.D.   On: 08/09/2021 09:18    EKG: None obtained in ED  Assessment/Plan Acute on chronic diastolic HF BLE lymphedema Chronic venous insufficiency     - admit to inpt, tele     - lasix IV, fluid restriction, I&O, daily wts     - echo     - no chest pain; trend trp, get EKG     - his legs do not appear infected; no white count or fever, hold abx for now  UTI     - continue rocephin     - follow UCx  BLE weakness     - he states his legs have become more weak as his swelling has worsened; no neurological deficit appreciated on exam     - will diurese and have PT take a look at him  Hypokalemia     - replace K+; Mg2+ ok.   LFY1O     - at baseline     - watch nephrotoxins      HLD PVD     - resume home statin  HTN PAH     - continue home regimen; switching demadex w/ lasix  A fib on xarelto     - continue home regimen  OSA     - CPAP qHS  Hx of bladder cancer     - s/p chemo, surgery     - follows w/ Dr. Alen Blew; no active treatment at this time.  DVT prophylaxis: xarelto  Code Status: Intubation only  Family Communication: None at bedside  Consults called: None   Status is: Inpatient  Remains inpatient appropriate because: severity of illness  Jonnie Finner DO [facility]  If 7PM-7AM, please contact night-coverage [email or web address]  08/09/2021, 3:10 PM

## 2021-08-09 NOTE — ED Triage Notes (Signed)
Pt comes in from independent living for bilateral leg swelling for the past 3 days.

## 2021-08-10 ENCOUNTER — Inpatient Hospital Stay (HOSPITAL_COMMUNITY): Payer: Medicare Other

## 2021-08-10 DIAGNOSIS — I872 Venous insufficiency (chronic) (peripheral): Secondary | ICD-10-CM

## 2021-08-10 DIAGNOSIS — N390 Urinary tract infection, site not specified: Secondary | ICD-10-CM

## 2021-08-10 DIAGNOSIS — I5033 Acute on chronic diastolic (congestive) heart failure: Secondary | ICD-10-CM

## 2021-08-10 DIAGNOSIS — I739 Peripheral vascular disease, unspecified: Secondary | ICD-10-CM

## 2021-08-10 DIAGNOSIS — R531 Weakness: Secondary | ICD-10-CM

## 2021-08-10 DIAGNOSIS — L8990 Pressure ulcer of unspecified site, unspecified stage: Secondary | ICD-10-CM | POA: Insufficient documentation

## 2021-08-10 DIAGNOSIS — I5042 Chronic combined systolic (congestive) and diastolic (congestive) heart failure: Secondary | ICD-10-CM | POA: Diagnosis not present

## 2021-08-10 HISTORY — PX: TRANSTHORACIC ECHOCARDIOGRAM: SHX275

## 2021-08-10 LAB — COMPREHENSIVE METABOLIC PANEL
ALT: 14 U/L (ref 0–44)
AST: 19 U/L (ref 15–41)
Albumin: 2.6 g/dL — ABNORMAL LOW (ref 3.5–5.0)
Alkaline Phosphatase: 60 U/L (ref 38–126)
Anion gap: 9 (ref 5–15)
BUN: 37 mg/dL — ABNORMAL HIGH (ref 8–23)
CO2: 27 mmol/L (ref 22–32)
Calcium: 8 mg/dL — ABNORMAL LOW (ref 8.9–10.3)
Chloride: 103 mmol/L (ref 98–111)
Creatinine, Ser: 2.03 mg/dL — ABNORMAL HIGH (ref 0.61–1.24)
GFR, Estimated: 32 mL/min — ABNORMAL LOW (ref >60)
Glucose, Bld: 149 mg/dL — ABNORMAL HIGH (ref 70–99)
Potassium: 2.9 mmol/L — ABNORMAL LOW (ref 3.5–5.1)
Sodium: 139 mmol/L (ref 135–145)
Total Bilirubin: 1 mg/dL (ref 0.3–1.2)
Total Protein: 6.4 g/dL — ABNORMAL LOW (ref 6.5–8.1)

## 2021-08-10 LAB — ECHOCARDIOGRAM COMPLETE
AR max vel: 2.53 cm2
AV Peak grad: 8.4 mmHg
Ao pk vel: 1.45 m/s
Area-P 1/2: 3.66 cm2
Calc EF: 62 %
Height: 73 in
S' Lateral: 3.8 cm
Single Plane A2C EF: 61.5 %
Single Plane A4C EF: 61.5 %
Weight: 4867.76 oz

## 2021-08-10 LAB — CBC
HCT: 26.4 % — ABNORMAL LOW (ref 39.0–52.0)
Hemoglobin: 8.4 g/dL — ABNORMAL LOW (ref 13.0–17.0)
MCH: 29.7 pg (ref 26.0–34.0)
MCHC: 31.8 g/dL (ref 30.0–36.0)
MCV: 93.3 fL (ref 80.0–100.0)
Platelets: 177 10*3/uL (ref 150–400)
RBC: 2.83 MIL/uL — ABNORMAL LOW (ref 4.22–5.81)
RDW: 14.6 % (ref 11.5–15.5)
WBC: 6.2 10*3/uL (ref 4.0–10.5)
nRBC: 0 % (ref 0.0–0.2)

## 2021-08-10 LAB — HEMOGLOBIN AND HEMATOCRIT, BLOOD
HCT: 29.4 % — ABNORMAL LOW (ref 39.0–52.0)
Hemoglobin: 9.4 g/dL — ABNORMAL LOW (ref 13.0–17.0)

## 2021-08-10 LAB — OCCULT BLOOD GASTRIC / DUODENUM (SPECIMEN CUP)
Occult Blood, Gastric: POSITIVE — AB
pH, Gastric: 2

## 2021-08-10 MED ORDER — ONDANSETRON HCL 4 MG/2ML IJ SOLN
4.0000 mg | Freq: Once | INTRAMUSCULAR | Status: AC
  Administered 2021-08-10: 4 mg via INTRAVENOUS
  Filled 2021-08-10: qty 2

## 2021-08-10 MED ORDER — FUROSEMIDE 10 MG/ML IJ SOLN
80.0000 mg | Freq: Two times a day (BID) | INTRAMUSCULAR | Status: DC
  Administered 2021-08-10 – 2021-08-13 (×5): 80 mg via INTRAVENOUS
  Filled 2021-08-10 (×6): qty 8

## 2021-08-10 MED ORDER — PERFLUTREN LIPID MICROSPHERE
1.0000 mL | INTRAVENOUS | Status: AC | PRN
  Administered 2021-08-10: 2 mL via INTRAVENOUS
  Filled 2021-08-10: qty 10

## 2021-08-10 MED ORDER — PANTOPRAZOLE SODIUM 40 MG IV SOLR
40.0000 mg | Freq: Two times a day (BID) | INTRAVENOUS | Status: DC
  Administered 2021-08-10 – 2021-08-13 (×6): 40 mg via INTRAVENOUS
  Filled 2021-08-10 (×6): qty 40

## 2021-08-10 MED ORDER — ONDANSETRON HCL 4 MG/2ML IJ SOLN
4.0000 mg | Freq: Four times a day (QID) | INTRAMUSCULAR | Status: DC | PRN
  Administered 2021-08-11: 4 mg via INTRAVENOUS
  Filled 2021-08-10: qty 2

## 2021-08-10 NOTE — Consult Note (Signed)
WOC Nurse Consult Note: Patient receiving care in [facility]. Reason for Consult: BLE edema Wound type: partial thickness weeping areas RLE from chronic Venous Insufficiency Pressure Injury POA: Yes/No/NA Measurement: Wound bed: see photo Drainage (amount, consistency, odor) serous Periwound: hemosiderin staining, grossly edematous Dressing procedure/placement/frequency: Gently wash BLE with soap and water, pat dry. Apply Sween 24 Moisturizer to legs (do NOT put between toes).  Place as many Xeroform or Vaseline gauzes as necessary to cover any open areas, then ABD pads. Beginning behind the toes and going to just below the knees, spiral wrap kerlix, then 4 inch or 6 inch ace wrap. Perform daily.   OF NOTE: If compression wraps are desired, please obtain an ABI study.  Monitor the wound area(s) for worsening of condition such as: Signs/symptoms of infection,  Increase in size,  Development of or worsening of odor, Development of pain, or increased pain at the affected locations.  Notify the medical team if any of these develop.  Thank you for the consult. [facility] nurse will not follow at this time.  Please re-consult the [facility] team if needed.  Val Riles, RN, MSN, CWOCN, CNS-BC, pager [phone]

## 2021-08-10 NOTE — Evaluation (Signed)
Physical Therapy Evaluation Patient Details Name: Patrick Bishop MRN: [id number] DOB: 1940-10-06 Today's Date: 08/10/2021  History of Present Illness  Patrick Bishop is a 80 y.o. male with medical history significant of a fib on xarelto, diastolic HF, HTN, bladder cancer.  Presenting with BLE weakness and increased swelling,He was noted to be volume overloaded. Unable to  bear weight  and ambulate for the last few days.  Clinical Impression  The patient is resting in bed, wife at bedside to give most of history.  Patient resides in ILF and uses RW or scooter for mobility.  Per patient, patient has not been able to ambulate x  5 days, has been in  a recliner and using adult briefs. Unsure if patient was standing. . Currently recommend mechanical lift for OOB. Patient currently with significant LE weakness and reports pain when moving the legs, lower and both hips. Patient required max assistance to roll for linens to be changed, patient has limited strength in UE's to assist.  Did not attempt sitting on bed side due to significant assistance to roll and reposition. May require 3 persons to sit on bed edge next visit.    Pt admitted with above diagnosis.  Pt currently with functional limitations due to the deficits listed below (see PT Problem List). Pt will benefit from skilled PT to increase their independence and safety with mobility to allow discharge to the venue listed below.        Recommendations for follow up therapy are one component of a multi-disciplinary discharge planning process, led by the attending physician.  Recommendations may be updated based on patient status, additional functional criteria and insurance authorization.  Follow Up Recommendations Skilled nursing-short term rehab (<3 hours/day)    Assistance Recommended at Discharge Frequent or constant Supervision/Assistance  Functional Status Assessment    Equipment Recommendations  None recommended by PT     Recommendations for Other Services       Precautions / Restrictions Precautions Precautions: Fall Precaution Comments: cellulitias both legs, incontinence, lasix      Mobility  Bed Mobility Overal bed mobility: Needs Assistance Bed Mobility: Rolling Rolling: Total assist;+2 for physical assistance;+2 for safety/equipment         General bed mobility comments: total assist of 2 to roll to each side for linens changed. patient limited in reaching with  decreased UE strength    Transfers                   General transfer comment: NT, patient requiring extensive assistance in bed    Ambulation/Gait                  Stairs            Wheelchair Mobility    Modified Rankin (Stroke Patients Only)       Balance                                             Pertinent Vitals/Pain Pain Assessment: Faces Faces Pain Scale: Hurts whole lot Pain Location: left leg when lifted, hips when rolled Pain Descriptors / Indicators: Aching;Discomfort;Moaning Pain Intervention(s): Monitored during session    Home Living Family/patient expects to be discharged to:: Private residence Living Arrangements: Spouse/significant other Available Help at Discharge: Family;Available 24 hours/day Type of Home: Independent living facility Home Access: Level entry  Home Layout: One level Home Equipment: Conservation officer, nature (2 wheels);Electric scooter      Prior Function Prior Level of Function : Needs assist       Physical Assist : Mobility (physical);ADLs (physical)   ADLs (physical): Bathing;Dressing;Toileting Mobility Comments: recently remained in recliner and nonambulatory       Hand Dominance   Dominant Hand: Right    Extremity/Trunk Assessment   Upper Extremity Assessment Upper Extremity Assessment: Generalized weakness    Lower Extremity Assessment Lower Extremity Assessment: RLE deficits/detail;LLE deficits/detail RLE  Deficits / Details: lifts leg from bed x 2", limited knee flexion with pain in hips and lower leg reported LLE Deficits / Details: unable to lift the leg frombed, did not tolerate knee flexion       Communication   Communication: HOH  Cognition Arousal/Alertness: Awake/alert Behavior During Therapy: WFL for tasks assessed/performed Overall Cognitive Status: Within Functional Limits for tasks assessed Area of Impairment: Orientation                 Orientation Level: Time             General Comments: patient referred to wife for answers        General Comments General comments (skin integrity, edema, etc.): NT, remained in bed in supine    Exercises     Assessment/Plan    PT Assessment Patient needs continued PT services  PT Problem List Decreased strength;Decreased mobility;Decreased range of motion;Decreased knowledge of precautions;Decreased activity tolerance;Decreased skin integrity;Decreased knowledge of use of DME;Pain       PT Treatment Interventions DME instruction;Therapeutic activities;Gait training;Therapeutic exercise;Patient/family education;Balance training;Functional mobility training    PT Goals (Current goals can be found in the Care Plan section)  Acute Rehab PT Goals Patient Stated Goal: be able to ambulate PT Goal Formulation: With patient/family Time For Goal Achievement: 08/24/21 Potential to Achieve Goals: Fair    Frequency Min 2X/week   Barriers to discharge Decreased caregiver support      Co-evaluation               AM-PAC PT "6 Clicks" Mobility  Outcome Measure Help needed turning from your back to your side while in a flat bed without using bedrails?: Total Help needed moving from lying on your back to sitting on the side of a flat bed without using bedrails?: Total Help needed moving to and from a bed to a chair (including a wheelchair)?: Total Help needed standing up from a chair using your arms (e.g., wheelchair or  bedside chair)?: Total Help needed to walk in hospital room?: Total Help needed climbing 3-5 steps with a railing? : Total 6 Click Score: 6    End of Session Equipment Utilized During Treatment: Oxygen Activity Tolerance: Patient limited by fatigue;Patient limited by pain Patient left: in bed;with call bell/phone within reach;with bed alarm set;with nursing/sitter in room;with family/visitor present Nurse Communication: Mobility status;Need for lift equipment PT Visit Diagnosis: Muscle weakness (generalized) (M62.81);Difficulty in walking, not elsewhere classified (R26.2)    Time: 1130-1200 PT Time Calculation (min) (ACUTE ONLY): 30 min   Charges:   PT Evaluation $PT Eval Low Complexity: 1 Low PT Treatments $Therapeutic Activity: 8-22 mins        Tresa Endo PT Acute Rehabilitation Services Pager [phone] Office [phone]   Claretha Cooper 08/10/2021, 1:17 PM

## 2021-08-10 NOTE — Progress Notes (Signed)
ABI's have been completed. Preliminary results can be found in CV Proc through chart review.   08/10/21 1:38 PM Patrick Bishop RVT

## 2021-08-10 NOTE — Consult Note (Signed)
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  He also has a history of left ureteral obstruction secondary to direct involvement of his previous bladder tumor that involve the left ureteral orifice.  He underwent routine stent exchange on 05/12/2021 by Dr. Lovena Neighbours who is his primary urologist.  At that time, there was no evidence of bladder cancer recurrence.  He is currently admitted with chronic diastolic congestive heart failure with lower extremity edema.  He had presented with fever and lower abdominal cramping.  Urine culture was positive for gram-negative rods and he is on Rocephin.  Creatinine is around his baseline at 2.03.  He has no leukocytosis.  He was febrile this morning but no fever since then.  Past Medical History:  Diagnosis Date   AAA (abdominal aortic aneurysm)    3.5cm by 04/06/21 CT   Arthritis    Cancer of trigone of urinary bladder ([place]) 11/05/2020   Chronic diastolic heart failure (HCC)    Normal LV Fxn by Echo 04/2017    Chronic venous insufficiency 2015   s/p Bilateral Iliac & Femoral Stents (including IVC) - DEEP Venous Reflux.  Also s/p Bilateral GSV Ablation.   Dyspnea    Essential hypertension    History of kidney stones    Hx of adenomatous colonic polyps    Hyperlipidemia with target LDL less than 100    Longstanding persistent atrial fibrillation ([place])    Nephrolithiasis 05/2019   CKD stage 3   Obesity    OSA on CPAP    does not cpap    Pneumonia 08/2019   Pre-diabetes    Pulmonary hypertension ([place])    Related to OSA, partial component of HFpEF   Pyelonephritis 05/2019   Past Surgical History:  Procedure Laterality Date   ABDOMINAL VENOUS DUPLEX Bilateral 07/2014   ([facility]: Dr. Cleda Mccreedy): (prior to Bilateral Iliac-Femoral Venous Stent Placement): Patent  IVC.  No DVT B/L Iliac or Com fem V..  +++ DEEP V REFLUX B/L ILIAC & FEM V.  Absent Superficial V Reflux B/L x L SSV (too tortuous for intervention).  BL GSV occluded - prior ablation.    ABDOMINAL VENOUS DUPLEX Bilateral 11/2014   Absent DVT.  Left common femoral venous reflux: 1508 MS left common femoral vein, 2284MS left proximal femoral vein -> cystic structure noted in the left popliteal vein (7.6 x 1.4 x 3.8 cm).;  Right leg deep venous reflux noted in the distal iliac, common femoral, proximal profunda femoral, proximal superficial femoral, superficial femoral and popliteal vein.   CARDIAC CATHETERIZATION  10/2009   [facility] - Dr. 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  EF~45%.  Mild pulmonary pretension.   CARDIAC PET  08/2009   Inferolateral partially reversible defect -- > false positive by cath   COLONOSCOPY     CYSTOSCOPY W/ URETERAL STENT PLACEMENT Left 01/19/2021   Procedure: CYSTOSCOPY WITH STENT EXCHANGE;  Surgeon: Ceasar Mons, MD;  Location: [facility];  Service: Urology;  Laterality: Left;  ONLY NEEDS 30 MIN   CYSTOSCOPY WITH STENT PLACEMENT Left 05/12/2021   Procedure: CYSTOSCOPY WITH LEFT STENT EXCHANGE;  Surgeon: Ceasar Mons, MD;  Location: [facility];  Service: Urology;  Laterality: Left;  ONLY NEEDS 30 MIN   CYSTOSCOPY/URETEROSCOPY/HOLMIUM LASER/STENT PLACEMENT Left 11/06/2019   Procedure: CYSTOSCOPY/RETROGRADE/URETEROSCOPY/HOLMIUM  LASER/STENT PLACEMENT. REMOVAL NEPHROSCOPYTUBE;  Surgeon: Ceasar Mons, MD;  Location: [facility];  Service: Urology;  Laterality: Left;   ESOPHAGOGASTRODUODENOSCOPY     GREATER SAPHENOUS VEIN ABLATION Bilateral Before 2015   [facility] (Dr. Eugenie Filler)   IR NEPHROSTOMY EXCHANGE LEFT  08/28/2019   IR NEPHROSTOMY PLACEMENT LEFT  06/24/2019   IR TRANSCATH PLC STENT  EA ADD VEIN  INC ANGIOPLASTY Left 10/2014   ([place], Dr. 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 Stormy Fabian): US Guided --> R Common & External Iliac, Common Femoral Venography with Placement of 24 mmx 70 mm Stent - R Com Iliac-Ext Iliac- & Com Fem V. reducing 60-80% venous compression to ~0%).;; also noted significant L-sided venous compression (staged stenting).    LIPOMA EXCISION     x 2 came back again   NM MYOVIEW LTD  11/2005   [facility] - Dr. Cleda Mccreedy): Normal myocardial perfusion scan. No ischemia or infarction.   TRANSTHORACIC ECHOCARDIOGRAM  04/26/2017    [facility] - Dr. Cleda Mccreedy) - report not available.  Per clinic note: Normal global LV function. Mild concentric temperature. Mild TR. Mild pulmonary hypertension.;;    TRANSTHORACIC ECHOCARDIOGRAM  7/'15; 7/'17   [facility] - Dr. Saverio Danker) a) low normal LV function (EF 56%). Moderate concentric LVH. Mild LA dilation. Normal pulmonary pressures.  ;; b) normal global function - EF 60-65%. Moderate pulmonary hypertension. Biatrial enlargement. Moderate concentric LVH.   TRANSTHORACIC ECHOCARDIOGRAM  02/2010   [facility] - Dr. Saverio Danker): Normal global LV function. Mild LVH. EF 60%.; November 2010. Moderate LV dilation. Moderate LVH. Normal EF 50-55%. Mild to moderate pulmonary hypertension. (July 2012 showed normal function with mild LVH, mild MR noted in August 2014)   TRANSTHORACIC ECHOCARDIOGRAM  05/2019   During admission for UTI, sepsis with A. fib RVR: LV normal size and function.  EF 60-65%.  Mildly reduced RV size with severely reduced function.  Despite this normal atrial size noted with just only mild right atrial enlargement with elevated RAP (dilated IVC).   Relatively poor TR jet, but findings do suggest elevated RV pressures.   TRANSURETHRAL RESECTION OF BLADDER TUMOR Left 10/21/2020   Procedure: TRANSURETHRAL RESECTION OF BLADDER TUMOR (TURBT)/ RETROGRADE/  URETERAL STENT PLACEMENT;  Surgeon: Ceasar Mons, MD;  Location[facility];  Service: Urology;  Laterality: Left;    Home Medications:  Medications Prior to Admission  Medication Sig Dispense Refill Last Dose   atorvastatin (LIPITOR) 10 MG tablet TAKE 1 TABLET DAILY (Patient taking differently: Take 10 mg by mouth daily.) 90 tablet 3 08/09/2021   loperamide (IMODIUM A-D) 2 MG tablet Take 2-4 mg by mouth 4 (four) times daily as needed for diarrhea or loose stools.   Past Week   metoprolol tartrate (LOPRESSOR) 25 MG tablet Take 25-37.5 mg by mouth See admin instructions. Take 25 mg in the morning and 37.5 mg in the evening   08/09/2021 at 0530   prochlorperazine (COMPAZINE) 10 MG tablet TAKE 1 TABLET(10 MG) BY MOUTH EVERY 6 HOURS AS NEEDED FOR NAUSEA OR VOMITING (Patient taking differently: Take 10 mg by mouth every 6 (six) hours as needed  for nausea or vomiting.) 30 tablet 0 unk   tamsulosin (FLOMAX) 0.4 MG CAPS capsule Take 0.4 mg by mouth daily.   08/09/2021   torsemide (DEMADEX) 20 MG tablet Take 4 tablets in the AM and 3 tablets in the PM alternating with 3 tablets in the AM and 2 tablets in the PM (Patient taking differently: Take 40-80 mg by mouth See admin instructions. Take 4 tablets in the AM and 3 tablets in the PM alternating with 3 tablets in the AM and 2 tablets in the PM) 180 tablet 0 08/09/2021   XARELTO 20 MG TABS tablet TAKE 1 TABLET DAILY WITH SUPPER (Patient taking differently: Take 20 mg by mouth daily with supper.) 90 tablet 3 08/08/2021 at 1700   Allergies:  Allergies  Allergen Reactions   Aldactone [Spironolactone] Other (See Comments)    unknown     Family History  Problem Relation Age of Onset   Colon cancer Mother    Heart attack Father 46    Hypertension Brother    Cancer Sister    Colon cancer Sister    Other Neg Hx        He really does not know much about his parents and siblings health, but they did not speak about healthcare   Social History:  reports that he quit smoking about 26 years ago. His smoking use included cigarettes. He has a 35.00 pack-year smoking history. He has never used smokeless tobacco. He reports current alcohol use. He reports that he does not use drugs.  ROS: A complete review of systems was performed.  All systems are negative except for pertinent findings as noted. ROS   Physical Exam:  Vital signs in last 24 hours: Temp:  [97.6 F (36.4 C)-101.1 F (38.4 C)] 98.3 F (36.8 C) (11/15 1455) Pulse Rate:  [75-98] 75 (11/15 1455) Resp:  [18-31] 20 (11/15 1455) BP: (100-129)/(50-80) 113/53 (11/15 1455) SpO2:  [87 %-97 %] 90 % (11/15 1455) Weight:  [137.3 kg-138 kg] 138 kg (11/15 0426) General:  Alert and oriented, No acute distress HEENT: Normocephalic, atraumatic Neck: No JVD or lymphadenopathy Cardiovascular: Regular rate and rhythm Lungs: Regular rate and effort Abdomen: Soft, nontender, nondistended, no abdominal masses Back: No CVA tenderness Neurologic: Grossly intact  Laboratory Data:  Results for orders placed or performed during the hospital encounter of 08/09/21 (from the past 24 hour(s))  Comprehensive metabolic panel     Status: Abnormal   Collection Time: 08/10/21  3:51 AM  Result Value Ref Range   Sodium 139 135 - 145 mmol/L   Potassium 2.9 (L) 3.5 - 5.1 mmol/L   Chloride 103 98 - 111 mmol/L   CO2 27 22 - 32 mmol/L   Glucose, Bld 149 (H) 70 - 99 mg/dL   BUN 37 (H) 8 - 23 mg/dL   Creatinine, Ser 2.03 (H) 0.61 - 1.24 mg/dL   Calcium 8.0 (L) 8.9 - 10.3 mg/dL   Total Protein 6.4 (L) 6.5 - 8.1 g/dL   Albumin 2.6 (L) 3.5 - 5.0 g/dL   AST 19 15 - 41 U/L   ALT 14 0 - 44 U/L   Alkaline Phosphatase 60 38 - 126 U/L   Total Bilirubin 1.0 0.3 - 1.2 mg/dL   GFR, Estimated 32 (L)  >60 mL/min   Anion gap 9 5 - 15  CBC     Status: Abnormal   Collection Time: 08/10/21  3:51 AM  Result Value Ref Range   WBC 6.2 4.0 - 10.5 K/uL  RBC 2.83 (L) 4.22 - 5.81 MIL/uL   Hemoglobin 8.4 (L) 13.0 - 17.0 g/dL   HCT 26.4 (L) 39.0 - 52.0 %   MCV 93.3 80.0 - 100.0 fL   MCH 29.7 26.0 - 34.0 pg   MCHC 31.8 30.0 - 36.0 g/dL   RDW 14.6 11.5 - 15.5 %   Platelets 177 150 - 400 K/uL   nRBC 0.0 0.0 - 0.2 %   Recent Results (from the past 240 hour(s))  Urine Culture     Status: Abnormal (Preliminary result)   Collection Time: 08/09/21 10:17 AM   Specimen: Urine, Catheterized  Result Value Ref Range Status   Specimen Description   Final    URINE, CATHETERIZED Performed at [facility], [place] [address], [place], [place] [address]    Special Requests   Final    NONE Performed at [facility], [place] [address], [place], [place] [address]    Culture (A)  Final    >=100,000 COLONIES/mL ESCHERICHIA COLI SUSCEPTIBILITIES TO FOLLOW Performed at [facility], [place] [address], [place], [place] [address]    Report Status PENDING  Incomplete  Resp Panel by RT-PCR (Flu A&B, Covid) Nasopharyngeal Swab     Status: None   Collection Time: 08/09/21 10:53 AM   Specimen: Nasopharyngeal Swab; Nasopharyngeal(NP) swabs in vial transport medium  Result Value Ref Range Status   SARS Coronavirus 2 by RT PCR NEGATIVE NEGATIVE Final    Comment: (NOTE) SARS-CoV-2 target nucleic acids are NOT DETECTED.  The SARS-CoV-2 RNA is generally detectable in upper respiratory specimens during the acute phase of infection. The lowest concentration of SARS-CoV-2 viral copies this assay can detect is 138 copies/mL. A negative result does not preclude SARS-Cov-2 infection and should not be used as the sole basis for treatment or other patient management decisions. A negative result may occur with  improper specimen collection/handling, submission of specimen other than  nasopharyngeal swab, presence of viral mutation(s) within the areas targeted by this assay, and inadequate number of viral copies(<138 copies/mL). A negative result must be combined with clinical observations, patient history, and epidemiological information. The expected result is Negative.  Fact Sheet for Patients:  EntrepreneurPulse.com.au  Fact Sheet for Healthcare Providers:  IncredibleEmployment.be  This test is no t yet approved or cleared by the [place] FDA and  has been authorized for detection and/or diagnosis of SARS-CoV-2 by FDA under an Emergency Use Authorization (EUA). This EUA will remain  in effect (meaning this test can be used) for the duration of the COVID-19 declaration under Section 564(b)(1) of the Act, 21 U.S.C.section 360bbb-3(b)(1), unless the authorization is terminated  or revoked sooner.       Influenza A by PCR NEGATIVE NEGATIVE Final   Influenza B by PCR NEGATIVE NEGATIVE Final    Comment: (NOTE) The Xpert Xpress SARS-CoV-2/FLU/RSV plus assay is intended as an aid in the diagnosis of influenza from Nasopharyngeal swab specimens and should not be used as a sole basis for treatment. Nasal washings and aspirates are unacceptable for Xpert Xpress SARS-CoV-2/FLU/RSV testing.  Fact Sheet for Patients: EntrepreneurPulse.com.au  Fact Sheet for Healthcare Providers: IncredibleEmployment.be  This test is not yet approved or cleared by the [place] FDA and has been authorized for detection and/or diagnosis of SARS-CoV-2 by FDA under an Emergency Use Authorization (EUA). This EUA will remain in effect (meaning this test can be used) for the duration of the COVID-19 declaration under Section 564(b)(1) of the Act, 21 U.S.C.  section 360bbb-3(b)(1), unless the authorization is terminated or revoked.  Performed at [facility], [place] [address], [place], [place] [address]    Creatinine: Recent Labs    08/09/21 9629 08/10/21 0351  CREATININE 2.17* 2.03*    Impression/Assessment:  Muscle invasive bladder cancer H/o left ureteral obstruction UTI  Plan:  Agree with renal US. If he has hydronephrosis would get a CT w/o contrast. Plan to follow-up on urine cultures.  Hopefully is sensitive to an oral antibiotic which he should take for approximately 14 days of total full dose antibiotics.  I would then transition him to a low-dose prophylactic antibiotic.  I typically use nitrofurantoin 100 mg nightly or trimethoprim 100 mg nightly depending on sensitivities.  He was scheduled for the office on the 17th.  We will cancel that and he can likely just be scheduled for stent exchange once he has recovered medically and has been on an appropriate course of antibiotics.  Marton Redwood, III 08/10/2021, 4:14 PM

## 2021-08-10 NOTE — Plan of Care (Signed)
  Problem: Education: Goal: Knowledge of General Education information will improve Description: Including pain rating scale, medication(s)/side effects and non-pharmacologic comfort measures Outcome: Progressing   Problem: Health Behavior/Discharge Planning: Goal: Ability to manage health-related needs will improve Outcome: Progressing   Problem: Clinical Measurements: Goal: Diagnostic test results will improve Outcome: Progressing Goal: Respiratory complications will improve Outcome: Progressing Goal: Cardiovascular complication will be avoided Outcome: Progressing   Problem: Nutrition: Goal: Adequate nutrition will be maintained Outcome: Progressing   Problem: Coping: Goal: Level of anxiety will decrease Outcome: Progressing   Problem: Elimination: Goal: Will not experience complications related to bowel motility Outcome: Progressing Goal: Will not experience complications related to urinary retention Outcome: Progressing   Problem: Pain Managment: Goal: General experience of comfort will improve Outcome: Progressing   Problem: Safety: Goal: Ability to remain free from injury will improve Outcome: Progressing   Problem: Clinical Measurements: Goal: Ability to maintain clinical measurements within normal limits will improve Outcome: Not Progressing Goal: Will remain free from infection Outcome: Not Progressing   Problem: Activity: Goal: Risk for activity intolerance will decrease Outcome: Not Progressing   Problem: Skin Integrity: Goal: Risk for impaired skin integrity will decrease Outcome: Not Progressing

## 2021-08-10 NOTE — Progress Notes (Addendum)
Informed by staff that patient had episode of emesis that may have contained coffee grounds.  He had bowel movement around the same time that was normal in color that did not appear to be melanotic or have fresh blood.  Stat hemoglobin sent that appears to be stable.  We will hold further Xarelto for now (last dose earlier today).  Gastric contents positive for occult blood.  Started on IV Protonix.  I  Patient had 3 more episodes of coffee ground emesis. Last episode was small volume. Remains hemodynamically stable. Discussed with Dr. Paulita Fujita and with Xarelto in his system, this would need to be reversed prior to any procedures. Currently he appears comfortable. Keeping him NPO. Will recheck hemoglobin around midnight. If he has further major bleeding, or significant drop in hemoglobin, would consider reversal agent. GI will follow up in AM  I tried calling patient's daughter and significant other for update and both numbers went to voicemail.  [employer]

## 2021-08-10 NOTE — Progress Notes (Signed)
Pt vomited a moderate amount of brown-red tinged emesis. Pt Xarelto placed on hold and labs collected. MD made aware.SRP, RN

## 2021-08-10 NOTE — Progress Notes (Signed)
Across his PROGRESS NOTE    Patrick Bishop  CBS:[id number] DOB: [birth year] DOA: 08/09/2021 PCP: Lujean Amel, MD    Brief Narrative:  80 year old male with history of atrial fibrillation on anticoagulation, diastolic heart failure, hypertension, prior history of bladder cancer, left ureter hydronephrosis status post stenting, presents with worsening lower extremity swelling, shortness of breath.  Patient with difficulty to ambulate due to his legs being so heavy from the swelling.  Reports compliance with medication.  He is on large doses of diuretics.  Admitted for IV diuresis.  Also noted to have UTI.  Started on IV antibiotics.  Urology consulted since he does have history of hydronephrosis and is due for stent exchange soon.   Assessment & Plan:   Active Problems:   Generalized weakness   Pressure injury of skin   Acute on chronic diastolic congestive heart failure Bilateral lower extremity edema, venous stasis -Started on Lasix IV 40 mg daily -Since his daily dose of torsemide is 80 mg in the morning and 60 mg in the evening, will increase Lasix dosing to Lasix IV 80 mg twice daily -May need to consider a dose of metolazone if he does not have significant diuresis -Repeat echocardiogram has been ordered -Checking ABIs, if perfusion is adequate, could consider Unna boots  Urinary tract infection -Presented with fever, has lower abdominal cramping -Urine culture positive for gram-negative rods -Continue on Rocephin  History of left hydronephrosis status post stent -Reports that patient follows with Dr. Lovena Neighbours in had stent exchange every 3 months -Case reviewed with Dr. Gloriann Loan who will see in consultation -Since patient does have UTI, will check renal ultrasound to evaluate for any hydronephrosis -Further urology recommendations included starting suppressive antibiotic therapy with trimethoprim or Macrobid once acute infection has been treated  Bladder cancer -Status post  radiation and chemotherapy -Last cystoscopy 04/2021 did not show any residual cancer -Followed by Dr. Alen Blew, currently on active surveillance  Permanent atrial fibrillation -CHA2DS2-VASc score of 5 -Anticoagulated with Xarelto -Heart rate stable on Toprol  CKD stage IIIb -Admission creatinine noted to be 2.1 which appears to be near baseline -Continue to monitor while undergoing diuresis  Anemia, likely related to chronic kidney disease -No evidence of bleeding -Currently hemoglobin appears to be near baseline. -Continue to follow hemoglobin -Transfuse for hemoglobin less than 7  OSA -Continue CPAP  Morbid obesity -BMI 40.1 -Increased risk of morbidity and mortality  Generalized weakness -Seen by physical therapy with recommendations for skilled nursing facility placement   DVT prophylaxis:  rivaroxaban (XARELTO) tablet 20 mg  Code Status: Partial code, no CPR, defibrillation, okay with intubation Family Communication: Discussed with wife at the bedside Disposition Plan: Status is: Inpatient  Remains inpatient appropriate because: Needs further diuresis, antibiotics for UTI, urology evaluation         Consultants:  Urology  Procedures:    Antimicrobials:  Ceftriaxone 11/14 >   Subjective: Continues to have significant lower extremity edema and feels that his legs are heavy.  Describes cramping lower abdomen.  No nausea or vomiting.  He does feel generally weak.  Objective: Vitals:   08/10/21 0416 08/10/21 0426 08/10/21 0746 08/10/21 1455  BP: (!) 102/58  (!) 104/50 (!) 113/53  Pulse: 81  81 75  Resp: 18  (!) 31 20  Temp: (!) 101.1 F (38.4 C)  97.6 F (36.4 C) 98.3 F (36.8 C)  TempSrc: Oral  Oral Oral  SpO2: 94%  94% 90%  Weight:  (!) 138 kg    Height:  Intake/Output Summary (Last 24 hours) at 08/10/2021 1507 Last data filed at 08/10/2021 0800 Gross per 24 hour  Intake 230 ml  Output 1650 ml  Net -1420 ml   Filed Weights    08/09/21 0843 08/09/21 1736 08/10/21 0426  Weight: (!) 142 kg (!) 137.3 kg (!) 138 kg    Examination:  General exam: Appears calm and comfortable  Respiratory system: Clear to auscultation. Respiratory effort normal. Cardiovascular system: S1 & S2 heard, RRR. No JVD, murmurs, rubs, gallops or clicks. No pedal edema. Gastrointestinal system: Abdomen is nondistended, soft and nontender. No organomegaly or masses felt. Normal bowel sounds heard. Central nervous system: Alert and oriented. No focal neurological deficits. Extremities: 2-3+ pitting edema with venous stasis changes Skin: No rashes, lesions or ulcers Psychiatry: Judgement and insight appear normal. Mood & affect appropriate.     Data Reviewed: I have personally reviewed following labs and imaging studies  CBC: Recent Labs  Lab 08/09/21 0924 08/10/21 0351  WBC 7.7 6.2  HGB 9.5* 8.4*  HCT 29.3* 26.4*  MCV 91.6 93.3  PLT 207 001   Basic Metabolic Panel: Recent Labs  Lab 08/09/21 0924 08/10/21 0351  NA 137 139  K 3.2* 2.9*  CL 100 103  CO2 26 27  GLUCOSE 158* 149*  BUN 36* 37*  CREATININE 2.17* 2.03*  CALCIUM 8.5* 8.0*  MG 2.2  --    GFR: Estimated Creatinine Clearance: 41.6 mL/min (A) (by C-G formula based on SCr of 2.03 mg/dL (H)). Liver Function Tests: Recent Labs  Lab 08/09/21 0924 08/10/21 0351  AST 23 19  ALT 14 14  ALKPHOS 72 60  BILITOT 1.6* 1.0  PROT 7.6 6.4*  ALBUMIN 3.4* 2.6*   No results for input(s): LIPASE, AMYLASE in the last 168 hours. No results for input(s): AMMONIA in the last 168 hours. Coagulation Profile: No results for input(s): INR, PROTIME in the last 168 hours. Cardiac Enzymes: No results for input(s): CKTOTAL, CKMB, CKMBINDEX, TROPONINI in the last 168 hours. BNP (last 3 results) No results for input(s): PROBNP in the last 8760 hours. HbA1C: No results for input(s): HGBA1C in the last 72 hours. CBG: No results for input(s): GLUCAP in the last 168 hours. Lipid  Profile: No results for input(s): CHOL, HDL, LDLCALC, TRIG, CHOLHDL, LDLDIRECT in the last 72 hours. Thyroid Function Tests: No results for input(s): TSH, T4TOTAL, FREET4, T3FREE, THYROIDAB in the last 72 hours. Anemia Panel: No results for input(s): VITAMINB12, FOLATE, FERRITIN, TIBC, IRON, RETICCTPCT in the last 72 hours. Sepsis Labs: No results for input(s): PROCALCITON, LATICACIDVEN in the last 168 hours.  Recent Results (from the past 240 hour(s))  Urine Culture     Status: Abnormal (Preliminary result)   Collection Time: 08/09/21 10:17 AM   Specimen: Urine, Catheterized  Result Value Ref Range Status   Specimen Description   Final    URINE, CATHETERIZED Performed at [facility], [place] [address], [place], [place] [address]    Special Requests   Final    NONE Performed at [facility], [place] [address] [place], [place] [address]    Culture (A)  Final    >=100,000 COLONIES/mL ESCHERICHIA COLI SUSCEPTIBILITIES TO FOLLOW Performed at [facility], [place] [address] [place],  [address]    Report Status PENDING  Incomplete  Resp Panel by RT-PCR (Flu A&B, Covid) Nasopharyngeal Swab     Status: None   Collection Time: 08/09/21 10:53 AM   Specimen: Nasopharyngeal Swab; Nasopharyngeal(NP) swabs in  vial transport medium  Result Value Ref Range Status   SARS Coronavirus 2 by RT PCR NEGATIVE NEGATIVE Final    Comment: (NOTE) SARS-CoV-2 target nucleic acids are NOT DETECTED.  The SARS-CoV-2 RNA is generally detectable in upper respiratory specimens during the acute phase of infection. The lowest concentration of SARS-CoV-2 viral copies this assay can detect is 138 copies/mL. A negative result does not preclude SARS-Cov-2 infection and should not be used as the sole basis for treatment or other patient management decisions. A negative result may occur with  improper specimen collection/handling, submission of specimen other than  nasopharyngeal swab, presence of viral mutation(s) within the areas targeted by this assay, and inadequate number of viral copies(<138 copies/mL). A negative result must be combined with clinical observations, patient history, and epidemiological information. The expected result is Negative.  Fact Sheet for Patients:  EntrepreneurPulse.com.au  Fact Sheet for Healthcare Providers:  IncredibleEmployment.be  This test is no t yet approved or cleared by the [place] FDA and  has been authorized for detection and/or diagnosis of SARS-CoV-2 by FDA under an Emergency Use Authorization (EUA). This EUA will remain  in effect (meaning this test can be used) for the duration of the COVID-19 declaration under Section 564(b)(1) of the Act, 21 U.S.C.section 360bbb-3(b)(1), unless the authorization is terminated  or revoked sooner.       Influenza A by PCR NEGATIVE NEGATIVE Final   Influenza B by PCR NEGATIVE NEGATIVE Final    Comment: (NOTE) The Xpert Xpress SARS-CoV-2/FLU/RSV plus assay is intended as an aid in the diagnosis of influenza from Nasopharyngeal swab specimens and should not be used as a sole basis for treatment. Nasal washings and aspirates are unacceptable for Xpert Xpress SARS-CoV-2/FLU/RSV testing.  Fact Sheet for Patients: EntrepreneurPulse.com.au  Fact Sheet for Healthcare Providers: IncredibleEmployment.be  This test is not yet approved or cleared by the [place] FDA and has been authorized for detection and/or diagnosis of SARS-CoV-2 by FDA under an Emergency Use Authorization (EUA). This EUA will remain in effect (meaning this test can be used) for the duration of the COVID-19 declaration under Section 564(b)(1) of the Act, 21 U.S.C. section 360bbb-3(b)(1), unless the authorization is terminated or revoked.  Performed at [facility], [place] [address] [place], [place] [address]          Radiology Studies: [facility] Chest Port 1 View  Result Date: 08/09/2021 CLINICAL DATA:  Swelling EXAM: PORTABLE CHEST 1 VIEW COMPARISON:  08/02/2019 FINDINGS: Cardiomegaly. Both lungs are clear. The visualized skeletal structures are unremarkable. IMPRESSION: Cardiomegaly without acute abnormality of the lungs in AP portable projection. Electronically Signed   By: Delanna Ahmadi M.D.   On: 08/09/2021 09:18   VAS [place] ABI WITH/WO TBI  Result Date: 08/10/2021  LOWER EXTREMITY DOPPLER STUDY Patient Name:  JUDEA RICHES  Date of Exam:   08/10/2021 Medical Rec #: [id number]        Accession #:    [id number] Date of Birth: August 21, 1941        Patient Gender: M Patient Age:   11 years Exam Location:  [facility] Procedure:      VAS [place] ABI WITH/WO TBI Referring Phys: Jolaine Artist Avangeline Stockburger --------------------------------------------------------------------------------  Indications: Peripheral artery disease. High Risk Factors: Hypertension, hyperlipidemia. Other Factors: Chronic venous insufficiency.  Limitations: Today's exam was limited due to an open wound and patient body              habitus. Comparison Study: No prior studies. Performing  Technologist: Carlos Levering RVT  Examination Guidelines: A complete evaluation includes at minimum, Doppler waveform signals and systolic blood pressure reading at the level of bilateral brachial, anterior tibial, and posterior tibial arteries, when vessel segments are accessible. Bilateral testing is considered an integral part of a complete examination. Photoelectric Plethysmograph (PPG) waveforms and toe systolic pressure readings are included as required and additional duplex testing as needed. Limited examinations for reoccurring indications may be performed as noted.  ABI Findings: +--------+------------------+-----+---------+--------+ Right   Rt Pressure (mmHg)IndexWaveform Comment   +--------+------------------+-----+---------+--------+ [id number]                    triphasic         +--------+------------------+-----+---------+--------+ PTA     132               1.13 biphasic          +--------+------------------+-----+---------+--------+ DP      102               0.87 biphasic          +--------+------------------+-----+---------+--------+ +--------+------------------+-----+---------+-------+ Left    Lt Pressure (mmHg)IndexWaveform Comment +--------+------------------+-----+---------+-------+ [id number]                    triphasic        +--------+------------------+-----+---------+-------+ PTA     254               2.17 biphasic         +--------+------------------+-----+---------+-------+ DP      131               1.12 biphasic         +--------+------------------+-----+---------+-------+ +-------+-----------+-----------+------------+------------+ ABI/TBIToday's ABIToday's TBIPrevious ABIPrevious TBI +-------+-----------+-----------+------------+------------+ Right  1.13                                           +-------+-----------+-----------+------------+------------+ Left   [place]                                             +-------+-----------+-----------+------------+------------+   Summary: Right: Resting right ankle-brachial index is within normal range. No evidence of significant right lower extremity arterial disease. Left: Resting left ankle-brachial index indicates noncompressible left lower extremity arteries.  *See table(s) above for measurements and observations.  Electronically signed by Deitra Mayo MD on 08/10/2021 at 1:53:29 PM.    Final         Scheduled Meds:  atorvastatin  10 mg Oral Daily   docusate sodium  100 mg Oral BID   furosemide  80 mg Intravenous BID   metoprolol tartrate  25 mg Oral Daily   metoprolol tartrate  37.5 mg Oral QHS   potassium chloride  40 mEq Oral Daily   rivaroxaban   20 mg Oral Q supper   tamsulosin  0.4 mg Oral Daily   Continuous Infusions:  cefTRIAXone (ROCEPHIN)  IV 2 g (08/10/21 1121)     LOS: 1 day    Time spent: 20mins    Kathie Dike, MD [facility]   If 7PM-7AM, please contact night-coverage [email or web address]  08/10/2021, 3:07 PM

## 2021-08-10 NOTE — Plan of Care (Signed)

## 2021-08-10 NOTE — Progress Notes (Signed)
Patient refused cpap 

## 2021-08-11 ENCOUNTER — Inpatient Hospital Stay (HOSPITAL_COMMUNITY): Payer: Medicare Other | Admitting: Anesthesiology

## 2021-08-11 ENCOUNTER — Encounter (HOSPITAL_COMMUNITY)
Admission: EM | Disposition: A | Discharge status: Skilled Nursing Facility | Payer: Self-pay | Source: Skilled Nursing Facility | Attending: Internal Medicine

## 2021-08-11 ENCOUNTER — Inpatient Hospital Stay (HOSPITAL_COMMUNITY): Payer: Medicare Other

## 2021-08-11 ENCOUNTER — Encounter (HOSPITAL_COMMUNITY): Payer: Self-pay | Admitting: Internal Medicine

## 2021-08-11 DIAGNOSIS — K920 Hematemesis: Secondary | ICD-10-CM

## 2021-08-11 DIAGNOSIS — I5042 Chronic combined systolic (congestive) and diastolic (congestive) heart failure: Secondary | ICD-10-CM | POA: Diagnosis not present

## 2021-08-11 DIAGNOSIS — M7989 Other specified soft tissue disorders: Secondary | ICD-10-CM

## 2021-08-11 DIAGNOSIS — R609 Edema, unspecified: Secondary | ICD-10-CM

## 2021-08-11 DIAGNOSIS — R531 Weakness: Secondary | ICD-10-CM | POA: Diagnosis not present

## 2021-08-11 DIAGNOSIS — N390 Urinary tract infection, site not specified: Secondary | ICD-10-CM | POA: Diagnosis not present

## 2021-08-11 HISTORY — PX: ESOPHAGOGASTRODUODENOSCOPY: SHX5428

## 2021-08-11 LAB — BASIC METABOLIC PANEL
Anion gap: 11 (ref 5–15)
BUN: 37 mg/dL — ABNORMAL HIGH (ref 8–23)
CO2: 28 mmol/L (ref 22–32)
Calcium: 8.1 mg/dL — ABNORMAL LOW (ref 8.9–10.3)
Chloride: 102 mmol/L (ref 98–111)
Creatinine, Ser: 1.89 mg/dL — ABNORMAL HIGH (ref 0.61–1.24)
GFR, Estimated: 35 mL/min — ABNORMAL LOW (ref >60)
Glucose, Bld: 174 mg/dL — ABNORMAL HIGH (ref 70–99)
Potassium: 2.9 mmol/L — ABNORMAL LOW (ref 3.5–5.1)
Sodium: 141 mmol/L (ref 135–145)

## 2021-08-11 LAB — CBC
HCT: 29.5 % — ABNORMAL LOW (ref 39.0–52.0)
Hemoglobin: 9.5 g/dL — ABNORMAL LOW (ref 13.0–17.0)
MCH: 30.1 pg (ref 26.0–34.0)
MCHC: 32.2 g/dL (ref 30.0–36.0)
MCV: 93.4 fL (ref 80.0–100.0)
Platelets: 206 10*3/uL (ref 150–400)
RBC: 3.16 MIL/uL — ABNORMAL LOW (ref 4.22–5.81)
RDW: 14.6 % (ref 11.5–15.5)
WBC: 7.1 10*3/uL (ref 4.0–10.5)
nRBC: 0 % (ref 0.0–0.2)

## 2021-08-11 LAB — URINE CULTURE: Culture: >=100000 — AB

## 2021-08-11 LAB — POTASSIUM: Potassium: 2.9 mmol/L — ABNORMAL LOW (ref 3.5–5.1)

## 2021-08-11 LAB — MAGNESIUM: Magnesium: 2.1 mg/dL (ref 1.7–2.4)

## 2021-08-11 SURGERY — EGD (ESOPHAGOGASTRODUODENOSCOPY)
Anesthesia: General

## 2021-08-11 MED ORDER — DEXAMETHASONE SODIUM PHOSPHATE 10 MG/ML IJ SOLN
INTRAMUSCULAR | Status: DC | PRN
  Administered 2021-08-11: 5 mg via INTRAVENOUS

## 2021-08-11 MED ORDER — PROPOFOL 1000 MG/100ML IV EMUL
INTRAVENOUS | Status: AC
  Filled 2021-08-11: qty 100

## 2021-08-11 MED ORDER — SUCCINYLCHOLINE CHLORIDE 200 MG/10ML IV SOSY
PREFILLED_SYRINGE | INTRAVENOUS | Status: DC | PRN
  Administered 2021-08-11: 200 mg via INTRAVENOUS

## 2021-08-11 MED ORDER — ONDANSETRON HCL 4 MG/2ML IJ SOLN
INTRAMUSCULAR | Status: DC | PRN
  Administered 2021-08-11: 4 mg via INTRAVENOUS

## 2021-08-11 MED ORDER — LACTATED RINGERS IV SOLN
INTRAVENOUS | Status: AC | PRN
  Administered 2021-08-11: 1000 mL via INTRAVENOUS

## 2021-08-11 MED ORDER — SODIUM CHLORIDE 0.9 % IV SOLN: INTRAVENOUS | Status: DC

## 2021-08-11 MED ORDER — ETOMIDATE 2 MG/ML IV SOLN
INTRAVENOUS | Status: DC | PRN
  Administered 2021-08-11: 20 mg via INTRAVENOUS

## 2021-08-11 MED ORDER — POTASSIUM CHLORIDE 10 MEQ/100ML IV SOLN
10.0000 meq | INTRAVENOUS | Status: AC
  Filled 2021-08-11: qty 100

## 2021-08-11 MED ORDER — GERHARDT'S BUTT CREAM
TOPICAL_CREAM | Freq: Three times a day (TID) | CUTANEOUS | Status: DC
  Filled 2021-08-11: qty 1

## 2021-08-11 NOTE — Anesthesia Preprocedure Evaluation (Addendum)
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  EF~45%.  Mild pulmonary HTN 08/10/2021 ECHO: EF 60-65%, normal LVF with no wall motion abnormalities, mild LVH, low normal RV function with severe enlargement, Biatrial dilation, mod TR   Neuro/Psych negative neurological ROS     GI/Hepatic Neg liver ROS, Persistent emesis   Endo/Other  Morbid obesity  Renal/GU Renal InsufficiencyRenal disease   Bladder cancer    Musculoskeletal   Abdominal (+) + obese,   Peds  Hematology xarelto   Anesthesia Other Findings   Reproductive/Obstetrics                            Anesthesia Physical Anesthesia Plan  ASA: 3  Anesthesia Plan: General   Post-op Pain Management:    Induction: Intravenous and Rapid sequence  PONV Risk Score and Plan: 2 and Ondansetron and Dexamethasone  Airway Management Planned: Oral ETT  Additional Equipment: None  Intra-op Plan:   Post-operative Plan: Extubation in OR  Informed Consent: I have reviewed the patients History and Physical, chart, labs and discussed the procedure including the risks, benefits and alternatives for the proposed anesthesia with the patient or authorized  representative who has indicated his/her understanding and acceptance.       Plan Discussed with: CRNA and Surgeon  Anesthesia Plan Comments:        Anesthesia Quick Evaluation

## 2021-08-11 NOTE — Interval H&P Note (Signed)
History and Physical Interval Note:  08/11/2021 10:47 AM  Patrick Bishop  has presented today for surgery, with the diagnosis of hematemesis.  The various methods of treatment have been discussed with the patient and family. After consideration of risks, benefits and other options for treatment, the patient has consented to  Procedure(s): ESOPHAGOGASTRODUODENOSCOPY (EGD) (N/A) as a surgical intervention.  The patient's history has been reviewed, patient examined, no change in status, stable for surgery.  I have reviewed the patient's chart and labs.  Questions were answered to the patient's satisfaction.     Landry Dyke

## 2021-08-11 NOTE — H&P (View-Only) (Signed)
Referring Provider: [facility] Primary Care Physician:  Lujean Amel, MD Primary Gastroenterologist:  unassigned  Reason for Consultation:  Hematemesis HPI: Patrick Bishop is a 80 y.o. male  with history of atrial fibrillation on anticoagulation, diastolic heart failure, hypertension, prior history of bladder cancer, left ureter hydronephrosis status post stenting, presents for hematemesis.  Patient originally presented for worsening lower extremity swelling and shortness of breath in the setting of chronic diastolic congestive heart failure. Also being management for UTI.  Last night, 11/15 patient began to have persistent vomiting. Multiple episodes, last episode being around 4am. RN described it as brown-blood tinged which then progressed to a coffee ground emesis.Denies NSAID use. Rare alcohol use. Denies tobacco use. States he has never had this happen before. Denies abdominal pain.  Last dose of Xarelto yesterday morning. Gastric contents positive for occult blood.   Patient reports last colonoscopy being 4 years ago. Reports having a history of polyps. States they have even removed up to 12 polyps at once. Believes it was done at the [place], do not have records available.  Family history of colon cancer including his mother and sister who died from colon cancer.    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  Also s/p Bilateral GSV Ablation.   Dyspnea    Essential hypertension    History of kidney stones    Hx of adenomatous colonic polyps    Hyperlipidemia with target LDL less than 100    Longstanding persistent atrial fibrillation ([place])    Nephrolithiasis 05/2019   CKD stage 3    Obesity    OSA on CPAP    does not cpap    Pneumonia 08/2019   Pre-diabetes    Pulmonary hypertension ([place])    Related to OSA, partial component of HFpEF   Pyelonephritis 05/2019    Past Surgical History:  Procedure Laterality Date   ABDOMINAL VENOUS DUPLEX Bilateral 07/2014   ([place]: Dr. Cleda Mccreedy): (prior to Bilateral Iliac-Femoral Venous Stent Placement): Patent IVC.  No DVT B/L Iliac or Com fem V..  +++ DEEP V REFLUX B/L ILIAC & FEM V.  Absent Superficial V Reflux B/L x L SSV (too tortuous for intervention).  BL GSV occluded - prior ablation.    ABDOMINAL VENOUS DUPLEX Bilateral 11/2014   Absent DVT.  Left common femoral venous reflux: 1508 MS left common femoral vein, 2284MS left proximal femoral vein -> cystic structure noted in the left popliteal vein (7.6 x 1.4 x 3.8 cm).;  Right leg deep venous reflux noted in the distal iliac, common femoral, proximal profunda femoral, proximal superficial femoral, superficial femoral and popliteal vein.   CARDIAC CATHETERIZATION  10/2009   [facility] - Dr. 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  EF~45%.  Mild pulmonary pretension.   CARDIAC PET  08/2009   Inferolateral partially reversible defect -- > false positive by cath   COLONOSCOPY     CYSTOSCOPY W/ URETERAL STENT PLACEMENT Left 01/19/2021   Procedure: CYSTOSCOPY WITH STENT EXCHANGE;  Surgeon: Ceasar Mons, MD;  Location: [facility];  Service: Urology;  Laterality:  Left;  ONLY NEEDS 30 MIN   CYSTOSCOPY WITH STENT PLACEMENT Left 05/12/2021   Procedure: CYSTOSCOPY WITH LEFT STENT EXCHANGE;  Surgeon: Ceasar Mons, MD;  Location: [facility];  Service: Urology;  Laterality: Left;  ONLY NEEDS 30 MIN   CYSTOSCOPY/URETEROSCOPY/HOLMIUM LASER/STENT PLACEMENT Left 11/06/2019   Procedure: CYSTOSCOPY/RETROGRADE/URETEROSCOPY/HOLMIUM LASER/STENT PLACEMENT. REMOVAL NEPHROSCOPYTUBE;  Surgeon: Ceasar Mons, MD;  Location: [facility];  Service: Urology;  Laterality: Left;   ESOPHAGOGASTRODUODENOSCOPY     GREATER SAPHENOUS VEIN ABLATION Bilateral Before 2015   [facility] (Dr. Eugenie Filler)   IR NEPHROSTOMY EXCHANGE LEFT  08/28/2019   IR NEPHROSTOMY PLACEMENT LEFT  06/24/2019   IR TRANSCATH PLC STENT  EA ADD VEIN  INC ANGIOPLASTY Left 10/2014   ([place], Dr. 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 Stormy Fabian): US Guided --> R Common & External Iliac, Common Femoral Venography with Placement of 24 mmx 70 mm Stent - R Com Iliac-Ext Iliac- & Com Fem V. reducing 60-80% venous compression to ~0%).;; also noted significant L-sided venous compression (staged stenting).    LIPOMA EXCISION     x 2 came back again   NM MYOVIEW LTD  11/2005   Adenosine Myoview [facility] - Dr. Cleda Mccreedy): Normal myocardial perfusion scan. No ischemia or infarction.   TRANSTHORACIC ECHOCARDIOGRAM  04/26/2017    [facility] - Dr. Cleda Mccreedy) - report not available.  Per clinic note: Normal global LV function. Mild concentric temperature. Mild TR. Mild pulmonary hypertension.;;    TRANSTHORACIC ECHOCARDIOGRAM  7/'15; 7/'17   [facility] - Dr. Saverio Danker) a) low normal LV function (EF 56%). Moderate concentric LVH. Mild LA dilation. Normal pulmonary pressures.  ;; b) normal global function - EF 60-65%. Moderate pulmonary hypertension. Biatrial enlargement. Moderate concentric LVH.   TRANSTHORACIC ECHOCARDIOGRAM  02/2010   [facility] - Dr. Saverio Danker): Normal global LV function. Mild LVH. EF 60%.; November 2010. Moderate LV dilation. Moderate LVH. Normal EF 50-55%. Mild to moderate pulmonary hypertension.  (July 2012 showed normal function with mild LVH, mild MR noted in August 2014)   TRANSTHORACIC ECHOCARDIOGRAM  05/2019   During admission for UTI, sepsis with A. fib RVR: LV normal size and function.  EF 60-65%.  Mildly reduced RV size with severely reduced function.  Despite this normal atrial size noted with just only mild right atrial enlargement with elevated RAP (dilated IVC).  Relatively poor TR jet, but findings do suggest elevated RV pressures.   TRANSURETHRAL RESECTION OF BLADDER TUMOR Left 10/21/2020   Procedure: TRANSURETHRAL RESECTION OF BLADDER TUMOR (TURBT)/ RETROGRADE/  URETERAL STENT PLACEMENT;  Surgeon: Ceasar Mons, MD;  [facility];  Service: Urology;  Laterality: Left;    Prior to Admission medications   Medication Sig Start Date End Date Taking? Authorizing Provider  atorvastatin (LIPITOR) 10 MG tablet TAKE 1 TABLET DAILY Patient taking differently: Take 10 mg by mouth daily. 11/10/20  Yes [name], MD  cephALEXin (KEFLEX) 500 MG capsule Take 1 capsule (500 mg total) by mouth 3 (three) times daily. 08/09/21  Yes Lajean Saver, MD  loperamide (IMODIUM A-D) 2 MG tablet Take 2-4 mg by mouth 4 (four) times daily  as needed for diarrhea or loose stools.   Yes [provider]  metoprolol tartrate (LOPRESSOR) 25 MG tablet Take 25-37.5 mg by mouth See admin instructions. Take 25 mg in the morning and 37.5 mg in the evening 01/08/21  Yes [provider]  prochlorperazine (COMPAZINE) 10 MG tablet TAKE 1 TABLET(10 MG) BY MOUTH EVERY 6 HOURS AS NEEDED FOR NAUSEA OR VOMITING Patient taking differently: Take 10 mg by mouth every 6 (six) hours as needed for nausea or vomiting. 12/14/20  Yes [name], MD  tamsulosin (FLOMAX) 0.4 MG CAPS capsule Take 0.4 mg by mouth daily.   Yes [provider]  torsemide (DEMADEX) 20 MG tablet Take 4 tablets in the AM and 3 tablets in the PM alternating with 3 tablets in the AM and 2 tablets in the  PM Patient taking differently: Take 40-80 mg by mouth See admin instructions. Take 4 tablets in the AM and 3 tablets in the PM alternating with 3 tablets in the AM and 2 tablets in the PM 07/15/21  Yes [name], MD  XARELTO 20 MG TABS tablet TAKE 1 TABLET DAILY WITH SUPPER Patient taking differently: Take 20 mg by mouth daily with supper. 11/16/20  Yes [name], MD    Scheduled Meds:  atorvastatin  10 mg Oral Daily   docusate sodium  100 mg Oral BID   furosemide  80 mg Intravenous BID   metoprolol tartrate  25 mg Oral Daily   metoprolol tartrate  37.5 mg Oral QHS   pantoprazole (PROTONIX) IV  40 mg Intravenous Q12H   potassium chloride  40 mEq Oral Daily   tamsulosin  0.4 mg Oral Daily   Continuous Infusions:  cefTRIAXone (ROCEPHIN)  IV 2 g (08/10/21 1121)   PRN Meds:.acetaminophen **OR** acetaminophen, ondansetron (ZOFRAN) IV, prochlorperazine  Allergies as of 08/09/2021 - Review Complete 08/09/2021  Allergen Reaction Noted   Aldactone [spironolactone] Other (See Comments) 11/14/2017    Family History  Problem Relation Age of Onset   Colon cancer Mother    Heart attack Father 46   Hypertension Brother    Cancer Sister    Colon cancer Sister    Other Neg Hx        He really does not know much about his parents and siblings health, but they did not speak about healthcare    Social History   Socioeconomic History   Marital status: Widowed    Spouse name: Not on file   Number of children: 2   Years of education: Not on file   Highest education level: Some college, no degree  Occupational History   Occupation: Retired    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  This was to be close to his daughter who is here with him today.  He has 2 daughters and one grandchild.      Jacqualine Code,  [place] --PCP was Sherlynn Carbon, MD.  Cardiologist: Saverio Danker, MD (who followed him every 3 months with routine visits.  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   He currently lives at [place] [facility] (Dr. Albesa Seen) --however he hopes to be moving into his daughter's house once they have not established and ready for him.   Social Determinants of Health   Financial Resource Strain: Not on file  Food Insecurity: Not on file  Transportation Needs: Not on file  Physical Activity: Not on file  Stress: Not on file  Social Connections: Not on file  Intimate Partner Violence: Not on file    Review of Systems: Review of Systems  Constitutional:  Negative for chills and fever.  HENT:  Negative for congestion and sore throat.   Eyes:  Negative for pain and redness.  Respiratory:  Negative for cough and sputum production.   Cardiovascular:  Negative for chest pain and palpitations.  Gastrointestinal:  Positive for nausea and vomiting. Negative for abdominal pain, blood in stool, constipation, diarrhea, heartburn and melena.  Genitourinary:  Negative for flank pain and hematuria.  Musculoskeletal:  Negative for joint pain and myalgias.  Skin:  Negative for itching and rash.  Neurological:  Negative for dizziness and headaches.  Psychiatric/Behavioral:  Negative for suicidal  ideas. The patient is not nervous/anxious.     Physical Exam:Physical Exam Constitutional:      General: He is not in acute distress.    Appearance: Normal appearance. He is not ill-appearing.  HENT:     Head: Normocephalic and atraumatic.     Nose: Nose normal. No congestion.     Mouth/Throat:     Mouth: Mucous membranes are moist.     Pharynx: Oropharynx is clear.  Eyes:     General: No scleral icterus.    Extraocular Movements: Extraocular movements intact.     Conjunctiva/sclera: Conjunctivae normal.  Cardiovascular:     Rate and Rhythm: Normal rate.     Pulses: Normal pulses.     Heart sounds: No murmur heard. Pulmonary:     Effort: Pulmonary effort is normal.     Breath sounds: Normal breath sounds.  Abdominal:     General: Abdomen is flat. There is no distension.     Palpations: There is no mass.     Tenderness: There is no abdominal tenderness.  Musculoskeletal:     Cervical back: Normal range of motion.  Neurological:     Mental Status: He is alert.    Vital signs: Vitals:   08/11/21 0616 08/11/21 0757  BP: 121/72 115/61  Pulse: 84 88  Resp: 20 18  Temp: 98.2 F (36.8 C) 98.4 F (36.9 C)  SpO2: 93% 94%   Last BM Date: 08/10/21    GI:  Lab Results: Recent Labs    08/09/21 0924 08/10/21 0351 08/10/21 1853 08/11/21 0058  WBC 7.7 6.2  --  7.1  HGB 9.5* 8.4* 9.4* 9.5*  HCT 29.3* 26.4* 29.4* 29.5*  PLT 207 177  --  206   BMET Recent Labs    08/09/21 0924 08/10/21 0351 08/11/21 0058  NA 137 139 141  K 3.2* 2.9* 2.9*  CL 100 103 102  CO2 26 27 28   GLUCOSE 158* 149* 174*  BUN 36* 37* 37*  CREATININE 2.17* 2.03* 1.89*  CALCIUM 8.5* 8.0* 8.1*   LFT Recent Labs    08/10/21 0351  PROT 6.4*  ALBUMIN 2.6*  AST 19  ALT 14  ALKPHOS 60  BILITOT 1.0   PT/INR No results for input(s): LABPROT, INR in the last 72 hours.   Studies/Results: DG Abd 1 View  Result Date: 08/11/2021 CLINICAL DATA:  Nausea and vomiting EXAM: ABDOMEN - 1 VIEW  COMPARISON:  None. FINDINGS: The stomach is significantly distended with air. Left ureteral stent is noted in satisfactory position. Bilateral venous stents are noted and stable. No free air is seen. Degenerative changes of lumbar spine are noted. IMPRESSION: Significant gaseous distension of the stomach. No other acute abnormality is seen. Electronically Signed   By: Inez Catalina M.D.   On: 08/11/2021 03:02   US RENAL  Result Date: 08/10/2021 CLINICAL DATA:  UTI. EXAM: RENAL / URINARY TRACT ULTRASOUND COMPLETE COMPARISON:  None. FINDINGS: Right Kidney: Renal measurements: 11.3 cm x 6.4 cm x 6.5 cm = volume: 245.8 mL. Echogenicity within normal limits. No mass or hydronephrosis visualized. Left Kidney: Renal measurements: 10.8 cm x 5.0 cm x 5.5 cm = volume: 154.3 mL. Echogenicity within normal limits. No mass is visualized. Mild left-sided hydronephrosis is seen. Bladder: The urinary bladder is empty. Other: None. IMPRESSION: Mild left-sided hydronephrosis. Electronically Signed   By: Virgina Norfolk M.D.   On: 08/10/2021 19:27   DG Chest Port 1 View  Result Date: 08/09/2021 CLINICAL DATA:  Swelling EXAM: PORTABLE CHEST 1 VIEW COMPARISON:  08/02/2019 FINDINGS: Cardiomegaly. Both lungs are clear. The visualized skeletal structures are unremarkable. IMPRESSION: Cardiomegaly without acute abnormality of the lungs in AP portable projection. Electronically Signed   By: Delanna Ahmadi M.D.   On: 08/09/2021 09:18   VAS [place] ABI WITH/WO TBI  Result Date: 08/10/2021  LOWER EXTREMITY DOPPLER STUDY Patient Name:  Patrick Bishop  Date of Exam:   08/10/2021 Medical Rec #: [id number]        Accession #:    [id number] Date of Birth: 10-23-1940        Patient Gender: M Patient Age:   1 years Exam Location:  [facility] Procedure:      VAS [place] ABI WITH/WO TBI Referring Phys: Jolaine Artist MEMON --------------------------------------------------------------------------------  Indications: Peripheral artery disease.  High Risk Factors: Hypertension, hyperlipidemia. Other Factors: Chronic venous insufficiency.  Limitations: Today's exam was limited due to an open wound and patient body              habitus. Comparison Study: No prior studies. Performing Technologist: Carlos Levering RVT  Examination Guidelines: A complete evaluation includes at minimum, Doppler waveform signals and systolic blood pressure reading at the level of bilateral brachial, anterior tibial, and posterior tibial arteries, when vessel segments are accessible. Bilateral testing is considered an integral part of a complete examination. Photoelectric Plethysmograph (PPG) waveforms and toe systolic pressure readings are included as required and additional duplex testing as needed. Limited examinations for reoccurring indications may be performed as noted.  ABI Findings: +--------+------------------+-----+---------+--------+ Right   Rt Pressure (mmHg)IndexWaveform Comment  +--------+------------------+-----+---------+--------+ [id number]                    triphasic         +--------+------------------+-----+---------+--------+ PTA     132  1.13 biphasic          +--------+------------------+-----+---------+--------+ DP      102               0.87 biphasic          +--------+------------------+-----+---------+--------+ +--------+------------------+-----+---------+-------+ Left    Lt Pressure (mmHg)IndexWaveform Comment +--------+------------------+-----+---------+-------+ [id number]                    triphasic        +--------+------------------+-----+---------+-------+ PTA     254               2.17 biphasic         +--------+------------------+-----+---------+-------+ DP      131               1.12 biphasic         +--------+------------------+-----+---------+-------+ +-------+-----------+-----------+------------+------------+ ABI/TBIToday's ABIToday's TBIPrevious ABIPrevious TBI  +-------+-----------+-----------+------------+------------+ Right  1.13                                           +-------+-----------+-----------+------------+------------+ Left                                                +-------+-----------+-----------+------------+------------+   Summary: Right: Resting right ankle-brachial index is within normal range. No evidence of significant right lower extremity arterial disease. Left: Resting left ankle-brachial index indicates noncompressible left lower extremity arteries.  *See table(s) above for measurements and observations.  Electronically signed by Deitra Mayo MD on 08/10/2021 at 1:53:29 PM.    Final    ECHOCARDIOGRAM COMPLETE  Result Date: 08/10/2021    ECHOCARDIOGRAM REPORT   Patient Name:   Patrick Bishop Date of Exam: 08/10/2021 Medical Rec #:  [id number]       Height:       73.0 in Accession #:    [id number]      Weight:       304.2 lb Date of Birth:  1940-10-16       BSA:          2.572 m Patient Age:    74 years        BP:           104/50 mmHg Patient Gender: M               HR:           74 bpm. Exam Location:  Inpatient Procedure: 2D Echo, Color Doppler, Cardiac Doppler and Intracardiac            Opacification Agent Indications:    CHF  History:        Patient has prior history of Echocardiogram examinations. CHF,                 Pulmonary HTN; Risk Factors:Hypertension and Sleep Apnea.  Sonographer:    Jyl Heinz Referring Phys: [id number] [place]  1. Left ventricular ejection fraction, by estimation, is 60 to 65%. The left ventricle has normal function. The left ventricle has no regional wall motion abnormalities. There is mild concentric left ventricular hypertrophy. Left ventricular diastolic parameters are indeterminate. There is the interventricular septum is flattened in systole and diastole, consistent with right ventricular pressure and volume overload.  2. Right ventricular  systolic function is low  normal. The right ventricular size is severely enlarged.  3. Left atrial size was moderately dilated.  4. Right atrial size was severely dilated.  5. The mitral valve is normal in structure. No evidence of mitral valve regurgitation. No evidence of mitral stenosis.  6. Tricuspid valve regurgitation is moderate.  7. The aortic valve is tricuspid. Aortic valve regurgitation is not visualized. No aortic stenosis is present.  8. There is color/spectral Doppler discrepancy in pulmonic regurgitation signal, study may underestimated the level of regurgitation. FINDINGS  Left Ventricle: Left ventricular ejection fraction, by estimation, is 60 to 65%. The left ventricle has normal function. The left ventricle has no regional wall motion abnormalities. The left ventricular internal cavity size was normal in size. There is  mild concentric left ventricular hypertrophy. The interventricular septum is flattened in systole and diastole, consistent with right ventricular pressure and volume overload. Left ventricular diastolic parameters are indeterminate. Right Ventricle: The right ventricular size is severely enlarged. No increase in right ventricular wall thickness. Right ventricular systolic function is low normal. Left Atrium: Left atrial size was moderately dilated. Right Atrium: Right atrial size was severely dilated. Pericardium: There is no evidence of pericardial effusion. Mitral Valve: The mitral valve is normal in structure. No evidence of mitral valve regurgitation. No evidence of mitral valve stenosis. Tricuspid Valve: The tricuspid valve is normal in structure. Tricuspid valve regurgitation is moderate . No evidence of tricuspid stenosis. Aortic Valve: The aortic valve is tricuspid. Aortic valve regurgitation is not visualized. No aortic stenosis is present. Aortic valve peak gradient measures 8.4 mmHg. Pulmonic Valve: The pulmonic valve was not well visualized. Pulmonic valve regurgitation is mild. No evidence of  pulmonic stenosis. Aorta: The aortic root and ascending aorta are structurally normal, with no evidence of dilitation. Pulmonary Artery: There is color/spectral Doppler discrepancy in pulmonic regurgitation signal, study may underestimated the level of regurgitation. IAS/Shunts: The atrial septum is grossly normal.  LEFT VENTRICLE PLAX 2D LVIDd:         5.90 cm      Diastology LVIDs:         3.80 cm      LV e' medial:    8.92 cm/s LV PW:         1.30 cm      LV E/e' medial:  11.1 LV IVS:        1.20 cm      LV e' lateral:   11.20 cm/s LVOT diam:     2.00 cm      LV E/e' lateral: 8.8 LV SV:         64 LV SV Index:   25 LVOT Area:     3.14 cm  LV Volumes (MOD) LV vol d, MOD A2C: 142.0 ml LV vol d, MOD A4C: 131.0 ml LV vol s, MOD A2C: 54.6 ml LV vol s, MOD A4C: 50.4 ml LV SV MOD A2C:     87.4 ml LV SV MOD A4C:     131.0 ml LV SV MOD BP:      86.7 ml RIGHT VENTRICLE             IVC RV Basal diam:  5.50 cm     IVC diam: 2.10 cm RV Mid diam:    4.10 cm RV S prime:     11.70 cm/s TAPSE (M-mode): 2.0 cm LEFT ATRIUM              Index  RIGHT ATRIUM           Index LA diam:        4.70 cm  1.83 cm/m   RA Area:     35.20 cm LA Vol (A2C):   96.8 ml  37.64 ml/m  RA Volume:   137.00 ml 53.28 ml/m LA Vol (A4C):   108.0 ml 42.00 ml/m LA Biplane Vol: 104.0 ml 40.44 ml/m  AORTIC VALVE AV Area (Vmax): 2.53 cm AV Vmax:        145.00 cm/s AV Peak Grad:   8.4 mmHg LVOT Vmax:      117.00 cm/s LVOT Vmean:     85.900 cm/s LVOT VTI:       0.204 m  AORTA Ao Root diam: 3.50 cm Ao Asc diam:  3.30 cm MITRAL VALVE               TRICUSPID VALVE MV Area (PHT): 3.66 cm    TR Peak grad:   41.0 mmHg MV Decel Time: 207 msec    TR Vmax:        320.00 cm/s MV E velocity: 99.00 cm/s                            SHUNTS                            Systemic VTI:  0.20 m                            Systemic Diam: 2.00 cm Rudean Haskell MD Electronically signed by Rudean Haskell MD Signature Date/Time: 08/10/2021/3:36:58 PM    Final      Impression: Hematemesis - HGB 9.5, baseline for patient - BUN 37, Cr 1.89  UTI  CHF   Plan: Plan for EGD today I thoroughly discussed the procedures to include nature, alternatives, benefits, and risks including but not limited to bleeding, perforation, infection, anesthesia/cardiac and pulmonary complications. Patient provides understanding and gave verbal consent to proceed.   Continue Protonix 40mg  BID  Continue NPO status  Continue to hold blood thinner.  Continue anti-emetics and supportive care as needed.   [facility] will follow.       LOS: 2 days   Gregery Walberg Radford Pax  PA-C 08/11/2021, 8:05 AM  Contact #  [phone]

## 2021-08-11 NOTE — Consult Note (Signed)
Referring Provider: [facility] Primary Care Physician:  Lujean Amel, MD Primary Gastroenterologist:  unassigned  Reason for Consultation:  Hematemesis HPI: Patrick Bishop is a 80 y.o. male  with history of atrial fibrillation on anticoagulation, diastolic heart failure, hypertension, prior history of bladder cancer, left ureter hydronephrosis status post stenting, presents for hematemesis.  Patient originally presented for worsening lower extremity swelling and shortness of breath in the setting of chronic diastolic congestive heart failure. Also being management for UTI.  Last night, 11/15 patient began to have persistent vomiting. Multiple episodes, last episode being around 4am. RN described it as brown-blood tinged which then progressed to a coffee ground emesis.Denies NSAID use. Rare alcohol use. Denies tobacco use. States he has never had this happen before. Denies abdominal pain.  Last dose of Xarelto yesterday morning. Gastric contents positive for occult blood.   Patient reports last colonoscopy being 4 years ago. Reports having a history of polyps. States they have even removed up to 12 polyps at once. Believes it was done at the [place], do not have records available.  Family history of colon cancer including his mother and sister who died from colon cancer.    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  Also s/p Bilateral GSV Ablation.   Dyspnea    Essential hypertension    History of kidney stones    Hx of adenomatous colonic polyps    Hyperlipidemia with target LDL less than 100    Longstanding persistent atrial fibrillation ([place])    Nephrolithiasis 05/2019   CKD stage 3    Obesity    OSA on CPAP    does not cpap    Pneumonia 08/2019   Pre-diabetes    Pulmonary hypertension ([place])    Related to OSA, partial component of HFpEF   Pyelonephritis 05/2019    Past Surgical History:  Procedure Laterality Date   ABDOMINAL VENOUS DUPLEX Bilateral 07/2014   ([place]: Dr. Cleda Mccreedy): (prior to Bilateral Iliac-Femoral Venous Stent Placement): Patent IVC.  No DVT B/L Iliac or Com fem V..  +++ DEEP V REFLUX B/L ILIAC & FEM V.  Absent Superficial V Reflux B/L x L SSV (too tortuous for intervention).  BL GSV occluded - prior ablation.    ABDOMINAL VENOUS DUPLEX Bilateral 11/2014   Absent DVT.  Left common femoral venous reflux: 1508 MS left common femoral vein, 2284MS left proximal femoral vein -> cystic structure noted in the left popliteal vein (7.6 x 1.4 x 3.8 cm).;  Right leg deep venous reflux noted in the distal iliac, common femoral, proximal profunda femoral, proximal superficial femoral, superficial femoral and popliteal vein.   CARDIAC CATHETERIZATION  10/2009   [facility] - Dr. 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  EF~45%.  Mild pulmonary pretension.   CARDIAC PET  08/2009   Inferolateral partially reversible defect -- > false positive by cath   COLONOSCOPY     CYSTOSCOPY W/ URETERAL STENT PLACEMENT Left 01/19/2021   Procedure: CYSTOSCOPY WITH STENT EXCHANGE;  Surgeon: Ceasar Mons, MD;  Location: [facility];  Service: Urology;  Laterality:  Left;  ONLY NEEDS 30 MIN   CYSTOSCOPY WITH STENT PLACEMENT Left 05/12/2021   Procedure: CYSTOSCOPY WITH LEFT STENT EXCHANGE;  Surgeon: Ceasar Mons, MD;  Location: [facility];  Service: Urology;  Laterality: Left;  ONLY NEEDS 30 MIN   CYSTOSCOPY/URETEROSCOPY/HOLMIUM LASER/STENT PLACEMENT Left 11/06/2019   Procedure: CYSTOSCOPY/RETROGRADE/URETEROSCOPY/HOLMIUM LASER/STENT PLACEMENT. REMOVAL NEPHROSCOPYTUBE;  Surgeon: Ceasar Mons, MD;  Location: [facility];  Service: Urology;  Laterality: Left;   ESOPHAGOGASTRODUODENOSCOPY     GREATER SAPHENOUS VEIN ABLATION Bilateral Before 2015   [facility] (Dr. Eugenie Filler)   IR NEPHROSTOMY EXCHANGE LEFT  08/28/2019   IR NEPHROSTOMY PLACEMENT LEFT  06/24/2019   IR TRANSCATH PLC STENT  EA ADD VEIN  INC ANGIOPLASTY Left 10/2014   ([place], Dr. 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 Stormy Fabian): US Guided --> R Common & External Iliac, Common Femoral Venography with Placement of 24 mmx 70 mm Stent - R Com Iliac-Ext Iliac- & Com Fem V. reducing 60-80% venous compression to ~0%).;; also noted significant L-sided venous compression (staged stenting).    LIPOMA EXCISION     x 2 came back again   NM MYOVIEW LTD  11/2005   [facility] - Dr. Cleda Mccreedy): Normal myocardial perfusion scan. No ischemia or infarction.   TRANSTHORACIC ECHOCARDIOGRAM  04/26/2017    [facility] - Dr. Cleda Mccreedy) - report not available.  Per clinic note: Normal global LV function. Mild concentric temperature. Mild TR. Mild pulmonary hypertension.;;    TRANSTHORACIC ECHOCARDIOGRAM  7/'15; 7/'17   [facility] - Dr. Saverio Danker) a) low normal LV function (EF 56%). Moderate concentric LVH. Mild LA dilation. Normal pulmonary pressures.  ;; b) normal global function - EF 60-65%. Moderate pulmonary hypertension. Biatrial enlargement. Moderate concentric LVH.   TRANSTHORACIC ECHOCARDIOGRAM  02/2010   [facility] - Dr. Saverio Danker): Normal global LV function. Mild LVH. EF 60%.; November 2010. Moderate LV dilation. Moderate LVH. Normal EF 50-55%. Mild to moderate pulmonary hypertension.  (July 2012 showed normal function with mild LVH, mild MR noted in August 2014)   TRANSTHORACIC ECHOCARDIOGRAM  05/2019   During admission for UTI, sepsis with A. fib RVR: LV normal size and function.  EF 60-65%.  Mildly reduced RV size with severely reduced function.  Despite this normal atrial size noted with just only mild right atrial enlargement with elevated RAP (dilated IVC).  Relatively poor TR jet, but findings do suggest elevated RV pressures.   TRANSURETHRAL RESECTION OF BLADDER TUMOR Left 10/21/2020   Procedure: TRANSURETHRAL RESECTION OF BLADDER TUMOR (TURBT)/ RETROGRADE/  URETERAL STENT PLACEMENT;  Surgeon: Ceasar Mons, MD;  [facility];  Service: Urology;  Laterality: Left;    Prior to Admission medications   Medication Sig Start Date End Date Taking? Authorizing Provider  atorvastatin (LIPITOR) 10 MG tablet TAKE 1 TABLET DAILY Patient taking differently: Take 10 mg by mouth daily. 11/10/20  Yes [name], MD  cephALEXin (KEFLEX) 500 MG capsule Take 1 capsule (500 mg total) by mouth 3 (three) times daily. 08/09/21  Yes Lajean Saver, MD  loperamide (IMODIUM A-D) 2 MG tablet Take 2-4 mg by mouth 4 (four) times daily  as needed for diarrhea or loose stools.   Yes [provider]  metoprolol tartrate (LOPRESSOR) 25 MG tablet Take 25-37.5 mg by mouth See admin instructions. Take 25 mg in the morning and 37.5 mg in the evening 01/08/21  Yes [provider]  prochlorperazine (COMPAZINE) 10 MG tablet TAKE 1 TABLET(10 MG) BY MOUTH EVERY 6 HOURS AS NEEDED FOR NAUSEA OR VOMITING Patient taking differently: Take 10 mg by mouth every 6 (six) hours as needed for nausea or vomiting. 12/14/20  Yes [name], MD  tamsulosin (FLOMAX) 0.4 MG CAPS capsule Take 0.4 mg by mouth daily.   Yes [provider]  torsemide (DEMADEX) 20 MG tablet Take 4 tablets in the AM and 3 tablets in the PM alternating with 3 tablets in the AM and 2 tablets in the  PM Patient taking differently: Take 40-80 mg by mouth See admin instructions. Take 4 tablets in the AM and 3 tablets in the PM alternating with 3 tablets in the AM and 2 tablets in the PM 07/15/21  Yes [name], MD  XARELTO 20 MG TABS tablet TAKE 1 TABLET DAILY WITH SUPPER Patient taking differently: Take 20 mg by mouth daily with supper. 11/16/20  Yes [name], MD    Scheduled Meds:  atorvastatin  10 mg Oral Daily   docusate sodium  100 mg Oral BID   furosemide  80 mg Intravenous BID   metoprolol tartrate  25 mg Oral Daily   metoprolol tartrate  37.5 mg Oral QHS   pantoprazole (PROTONIX) IV  40 mg Intravenous Q12H   potassium chloride  40 mEq Oral Daily   tamsulosin  0.4 mg Oral Daily   Continuous Infusions:  cefTRIAXone (ROCEPHIN)  IV 2 g (08/10/21 1121)   PRN Meds:.acetaminophen **OR** acetaminophen, ondansetron (ZOFRAN) IV, prochlorperazine  Allergies as of 08/09/2021 - Review Complete 08/09/2021  Allergen Reaction Noted   Aldactone [spironolactone] Other (See Comments) 11/14/2017    Family History  Problem Relation Age of Onset   Colon cancer Mother    Heart attack Father 65   Hypertension Brother    Cancer Sister    Colon cancer Sister    Other Neg Hx        He really does not know much about his parents and siblings health, but they did not speak about healthcare    Social History   Socioeconomic History   Marital status: Widowed    Spouse name: Not on file   Number of children: 2   Years of education: Not on file   Highest education level: Some college, no degree  Occupational History   Occupation: Retired    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  This was to be close to his daughter who is here with him today.  He has 2 daughters and one grandchild.      Jacqualine Code,  [place] --PCP was Sherlynn Carbon, MD.  Cardiologist: Saverio Danker, MD (who followed him every 3 months with routine visits.  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   He currently lives at [place] [facility] (Dr. Albesa Seen) --however he hopes to be moving into his daughter's house once they have not established and ready for him.   Social Determinants of Health   Financial Resource Strain: Not on file  Food Insecurity: Not on file  Transportation Needs: Not on file  Physical Activity: Not on file  Stress: Not on file  Social Connections: Not on file  Intimate Partner Violence: Not on file    Review of Systems: Review of Systems  Constitutional:  Negative for chills and fever.  HENT:  Negative for congestion and sore throat.   Eyes:  Negative for pain and redness.  Respiratory:  Negative for cough and sputum production.   Cardiovascular:  Negative for chest pain and palpitations.  Gastrointestinal:  Positive for nausea and vomiting. Negative for abdominal pain, blood in stool, constipation, diarrhea, heartburn and melena.  Genitourinary:  Negative for flank pain and hematuria.  Musculoskeletal:  Negative for joint pain and myalgias.  Skin:  Negative for itching and rash.  Neurological:  Negative for dizziness and headaches.  Psychiatric/Behavioral:  Negative for suicidal  ideas. The patient is not nervous/anxious.     Physical Exam:Physical Exam Constitutional:      General: He is not in acute distress.    Appearance: Normal appearance. He is not ill-appearing.  HENT:     Head: Normocephalic and atraumatic.     Nose: Nose normal. No congestion.     Mouth/Throat:     Mouth: Mucous membranes are moist.     Pharynx: Oropharynx is clear.  Eyes:     General: No scleral icterus.    Extraocular Movements: Extraocular movements intact.     Conjunctiva/sclera: Conjunctivae normal.  Cardiovascular:     Rate and Rhythm: Normal rate.     Pulses: Normal pulses.     Heart sounds: No murmur heard. Pulmonary:     Effort: Pulmonary effort is normal.     Breath sounds: Normal breath sounds.  Abdominal:     General: Abdomen is flat. There is no distension.     Palpations: There is no mass.     Tenderness: There is no abdominal tenderness.  Musculoskeletal:     Cervical back: Normal range of motion.  Neurological:     Mental Status: He is alert.    Vital signs: Vitals:   08/11/21 0616 08/11/21 0757  BP: 121/72 115/61  Pulse: 84 88  Resp: 20 18  Temp: 98.2 F (36.8 C) 98.4 F (36.9 C)  SpO2: 93% 94%   Last BM Date: 08/10/21    GI:  Lab Results: Recent Labs    08/09/21 0924 08/10/21 0351 08/10/21 1853 08/11/21 0058  WBC 7.7 6.2  --  7.1  HGB 9.5* 8.4* 9.4* 9.5*  HCT 29.3* 26.4* 29.4* 29.5*  PLT 207 177  --  206   BMET Recent Labs    08/09/21 0924 08/10/21 0351 08/11/21 0058  NA 137 139 141  K 3.2* 2.9* 2.9*  CL 100 103 102  CO2 26 27 28   GLUCOSE 158* 149* 174*  BUN 36* 37* 37*  CREATININE 2.17* 2.03* 1.89*  CALCIUM 8.5* 8.0* 8.1*   LFT Recent Labs    08/10/21 0351  PROT 6.4*  ALBUMIN 2.6*  AST 19  ALT 14  ALKPHOS 60  BILITOT 1.0   PT/INR No results for input(s): LABPROT, INR in the last 72 hours.   Studies/Results: DG Abd 1 View  Result Date: 08/11/2021 CLINICAL DATA:  Nausea and vomiting EXAM: ABDOMEN - 1 VIEW  COMPARISON:  None. FINDINGS: The stomach is significantly distended with air. Left ureteral stent is noted in satisfactory position. Bilateral venous stents are noted and stable. No free air is seen. Degenerative changes of lumbar spine are noted. IMPRESSION: Significant gaseous distension of the stomach. No other acute abnormality is seen. Electronically Signed   By: Inez Catalina M.D.   On: 08/11/2021 03:02   US RENAL  Result Date: 08/10/2021 CLINICAL DATA:  UTI. EXAM: RENAL / URINARY TRACT ULTRASOUND COMPLETE COMPARISON:  None. FINDINGS: Right Kidney: Renal measurements: 11.3 cm x 6.4 cm x 6.5 cm = volume: 245.8 mL. Echogenicity within normal limits. No mass or hydronephrosis visualized. Left Kidney: Renal measurements: 10.8 cm x 5.0 cm x 5.5 cm = volume: 154.3 mL. Echogenicity within normal limits. No mass is visualized. Mild left-sided hydronephrosis is seen. Bladder: The urinary bladder is empty. Other: None. IMPRESSION: Mild left-sided hydronephrosis. Electronically Signed   By: Virgina Norfolk M.D.   On: 08/10/2021 19:27   DG Chest Port 1 View  Result Date: 08/09/2021 CLINICAL DATA:  Swelling EXAM: PORTABLE CHEST 1 VIEW COMPARISON:  08/02/2019 FINDINGS: Cardiomegaly. Both lungs are clear. The visualized skeletal structures are unremarkable. IMPRESSION: Cardiomegaly without acute abnormality of the lungs in AP portable projection. Electronically Signed   By: Delanna Ahmadi M.D.   On: 08/09/2021 09:18   VAS [place] ABI WITH/WO TBI  Result Date: 08/10/2021  LOWER EXTREMITY DOPPLER STUDY Patient Name:  Patrick Bishop  Date of Exam:   08/10/2021 Medical Rec #: [id number]        Accession #:    [id number] Date of Birth: [birth year]        Patient Gender: M Patient Age:   39 years Exam Location:  [facility] Procedure:      VAS [place] ABI WITH/WO TBI Referring Phys: Jolaine Artist MEMON --------------------------------------------------------------------------------  Indications: Peripheral artery disease.  High Risk Factors: Hypertension, hyperlipidemia. Other Factors: Chronic venous insufficiency.  Limitations: Today's exam was limited due to an open wound and patient body              habitus. Comparison Study: No prior studies. Performing Technologist: Carlos Levering RVT  Examination Guidelines: A complete evaluation includes at minimum, Doppler waveform signals and systolic blood pressure reading at the level of bilateral brachial, anterior tibial, and posterior tibial arteries, when vessel segments are accessible. Bilateral testing is considered an integral part of a complete examination. Photoelectric Plethysmograph (PPG) waveforms and toe systolic pressure readings are included as required and additional duplex testing as needed. Limited examinations for reoccurring indications may be performed as noted.  ABI Findings: +--------+------------------+-----+---------+--------+ Right   Rt Pressure (mmHg)IndexWaveform Comment  +--------+------------------+-----+---------+--------+ [id number]                    triphasic         +--------+------------------+-----+---------+--------+ PTA     132  1.13 biphasic          +--------+------------------+-----+---------+--------+ DP      102               0.87 biphasic          +--------+------------------+-----+---------+--------+ +--------+------------------+-----+---------+-------+ Left    Lt Pressure (mmHg)IndexWaveform Comment +--------+------------------+-----+---------+-------+ [id number]                    triphasic        +--------+------------------+-----+---------+-------+ PTA     254               2.17 biphasic         +--------+------------------+-----+---------+-------+ DP      131               1.12 biphasic         +--------+------------------+-----+---------+-------+ +-------+-----------+-----------+------------+------------+ ABI/TBIToday's ABIToday's TBIPrevious ABIPrevious TBI  +-------+-----------+-----------+------------+------------+ Right  1.13                                           +-------+-----------+-----------+------------+------------+ Left   [place]                                             +-------+-----------+-----------+------------+------------+   Summary: Right: Resting right ankle-brachial index is within normal range. No evidence of significant right lower extremity arterial disease. Left: Resting left ankle-brachial index indicates noncompressible left lower extremity arteries.  *See table(s) above for measurements and observations.  Electronically signed by Deitra Mayo MD on 08/10/2021 at 1:53:29 PM.    Final    ECHOCARDIOGRAM COMPLETE  Result Date: 08/10/2021    ECHOCARDIOGRAM REPORT   Patient Name:   Patrick Bishop Date of Exam: 08/10/2021 Medical Rec #:  [id number]       Height:       73.0 in Accession #:    [id number]      Weight:       304.2 lb Date of Birth:  05-14-1941       BSA:          2.572 m Patient Age:    27 years        BP:           104/50 mmHg Patient Gender: M               HR:           74 bpm. Exam Location:  Inpatient Procedure: 2D Echo, Color Doppler, Cardiac Doppler and Intracardiac            Opacification Agent Indications:    CHF  History:        Patient has prior history of Echocardiogram examinations. CHF,                 Pulmonary HTN; Risk Factors:Hypertension and Sleep Apnea.  Sonographer:    Jyl Heinz Referring Phys: [id number] [place]  1. Left ventricular ejection fraction, by estimation, is 60 to 65%. The left ventricle has normal function. The left ventricle has no regional wall motion abnormalities. There is mild concentric left ventricular hypertrophy. Left ventricular diastolic parameters are indeterminate. There is the interventricular septum is flattened in systole and diastole, consistent with right ventricular pressure and volume overload.  2. Right ventricular  systolic function is low  normal. The right ventricular size is severely enlarged.  3. Left atrial size was moderately dilated.  4. Right atrial size was severely dilated.  5. The mitral valve is normal in structure. No evidence of mitral valve regurgitation. No evidence of mitral stenosis.  6. Tricuspid valve regurgitation is moderate.  7. The aortic valve is tricuspid. Aortic valve regurgitation is not visualized. No aortic stenosis is present.  8. There is color/spectral Doppler discrepancy in pulmonic regurgitation signal, study may underestimated the level of regurgitation. FINDINGS  Left Ventricle: Left ventricular ejection fraction, by estimation, is 60 to 65%. The left ventricle has normal function. The left ventricle has no regional wall motion abnormalities. The left ventricular internal cavity size was normal in size. There is  mild concentric left ventricular hypertrophy. The interventricular septum is flattened in systole and diastole, consistent with right ventricular pressure and volume overload. Left ventricular diastolic parameters are indeterminate. Right Ventricle: The right ventricular size is severely enlarged. No increase in right ventricular wall thickness. Right ventricular systolic function is low normal. Left Atrium: Left atrial size was moderately dilated. Right Atrium: Right atrial size was severely dilated. Pericardium: There is no evidence of pericardial effusion. Mitral Valve: The mitral valve is normal in structure. No evidence of mitral valve regurgitation. No evidence of mitral valve stenosis. Tricuspid Valve: The tricuspid valve is normal in structure. Tricuspid valve regurgitation is moderate . No evidence of tricuspid stenosis. Aortic Valve: The aortic valve is tricuspid. Aortic valve regurgitation is not visualized. No aortic stenosis is present. Aortic valve peak gradient measures 8.4 mmHg. Pulmonic Valve: The pulmonic valve was not well visualized. Pulmonic valve regurgitation is mild. No evidence of  pulmonic stenosis. Aorta: The aortic root and ascending aorta are structurally normal, with no evidence of dilitation. Pulmonary Artery: There is color/spectral Doppler discrepancy in pulmonic regurgitation signal, study may underestimated the level of regurgitation. IAS/Shunts: The atrial septum is grossly normal.  LEFT VENTRICLE PLAX 2D LVIDd:         5.90 cm      Diastology LVIDs:         3.80 cm      LV e' medial:    8.92 cm/s LV PW:         1.30 cm      LV E/e' medial:  11.1 LV IVS:        1.20 cm      LV e' lateral:   11.20 cm/s LVOT diam:     2.00 cm      LV E/e' lateral: 8.8 LV SV:         64 LV SV Index:   25 LVOT Area:     3.14 cm  LV Volumes (MOD) LV vol d, MOD A2C: 142.0 ml LV vol d, MOD A4C: 131.0 ml LV vol s, MOD A2C: 54.6 ml LV vol s, MOD A4C: 50.4 ml LV SV MOD A2C:     87.4 ml LV SV MOD A4C:     131.0 ml LV SV MOD BP:      86.7 ml RIGHT VENTRICLE             IVC RV Basal diam:  5.50 cm     IVC diam: 2.10 cm RV Mid diam:    4.10 cm RV S prime:     11.70 cm/s TAPSE (M-mode): 2.0 cm LEFT ATRIUM              Index  RIGHT ATRIUM           Index LA diam:        4.70 cm  1.83 cm/m   RA Area:     35.20 cm LA Vol (A2C):   96.8 ml  37.64 ml/m  RA Volume:   137.00 ml 53.28 ml/m LA Vol (A4C):   108.0 ml 42.00 ml/m LA Biplane Vol: 104.0 ml 40.44 ml/m  AORTIC VALVE AV Area (Vmax): 2.53 cm AV Vmax:        145.00 cm/s AV Peak Grad:   8.4 mmHg LVOT Vmax:      117.00 cm/s LVOT Vmean:     85.900 cm/s LVOT VTI:       0.204 m  AORTA Ao Root diam: 3.50 cm Ao Asc diam:  3.30 cm MITRAL VALVE               TRICUSPID VALVE MV Area (PHT): 3.66 cm    TR Peak grad:   41.0 mmHg MV Decel Time: 207 msec    TR Vmax:        320.00 cm/s MV E velocity: 99.00 cm/s                            SHUNTS                            Systemic VTI:  0.20 m                            Systemic Diam: 2.00 cm Rudean Haskell MD Electronically signed by Rudean Haskell MD Signature Date/Time: 08/10/2021/3:36:58 PM    Final      Impression: Hematemesis - HGB 9.5, baseline for patient - BUN 37, Cr 1.89  UTI  CHF   Plan: Plan for EGD today I thoroughly discussed the procedures to include nature, alternatives, benefits, and risks including but not limited to bleeding, perforation, infection, anesthesia/cardiac and pulmonary complications. Patient provides understanding and gave verbal consent to proceed.   Continue Protonix 40mg  BID  Continue NPO status  Continue to hold blood thinner.  Continue anti-emetics and supportive care as needed.   [facility] will follow.       LOS: 2 days   Herndon Grill Radford Pax  PA-C 08/11/2021, 8:05 AM  Contact #  [phone]

## 2021-08-11 NOTE — Op Note (Signed)
[facility] Patient Name: Patrick Bishop Procedure Date: 08/11/2021 MRN: [id number] Attending MD: Arta Silence , MD Date of Birth: 1940/10/19 CSN: [id number] Age: 80 Admit Type: Inpatient Procedure:                Upper GI endoscopy Indications:              Coffee-ground emesis, Hematemesis Providers:                Arta Silence, MD, Brooke Person, Frazier Richards,                            Technician Referring MD:             [facility] Medicines:                General Anesthesia Complications:            No immediate complications. Estimated Blood Loss:     Estimated blood loss: none. Procedure:                Pre-Anesthesia Assessment:- Prior to the procedure,                            a History and Physical was performed, and patient                            medications and allergies were reviewed. The                            patient's tolerance of previous anesthesia was also                            reviewed. The risks and benefits of the procedure                            and the sedation options and risks were discussed                            with the patient. All questions were answered, and                            informed consent was obtained. Prior                            Anticoagulants: The patient has taken Xarelto                            (rivaroxaban), last dose was 1 day prior to                            procedure. ASA Grade Assessment: III - A patient                            with severe systemic disease. After reviewing the  risks and benefits, the patient was deemed in                            satisfactory condition to undergo the procedure.                           After obtaining informed consent, the endoscope was                            passed under direct vision. Throughout the                            procedure, the patient's blood pressure, pulse, and                             oxygen saturations were monitored continuously. The                            [id number]-H190 ([id number] Olympus endoscope was introduced                            through the mouth, and advanced to the second part                            of duodenum. The upper GI endoscopy was                            accomplished without difficulty. The patient                            tolerated the procedure well. Scope In: Scope Out: Findings:      LA Grade D (one or more mucosal breaks involving at least 75% of       esophageal circumference) esophagitis with no bleeding was found.      The exam of the esophagus was otherwise normal.      Patchy mild inflammation was found in the entire examined stomach.      Markedly J-shaped stomach. The exam of the stomach was otherwise normal.      Unable to obtain deep duodenal intubation, due to the markedly J-shaped       stomach. The duodenal bulb and first portion of the duodenum were normal.      No fresh blood or signs of recent active bleeding was identified. Impression:               - LA Grade D esophagitis with no bleeding. Highly                            likely source of hematemesis.                           - Gastritis.                           - Normal duodenal bulb and first portion of the  duodenum.                           - No specimens collected. Moderate Sedation:      Not Applicable - Patient had care per Anesthesia. Recommendation:           - Return patient to hospital ward for ongoing care.                           - Soft diet today.                           - Continue present medications.                           - PPI IV BID x 48 hours.                           - No Xarelto for 7 days, if clinically feasible                            (otherwise risk for rebleeding).                           [facility] will follow. Procedure Code(s):        --- Professional ---                            [id number], Esophagogastroduodenoscopy, flexible,                            transoral; diagnostic, including collection of                            specimen(s) by brushing or washing, when performed                            (separate procedure) Diagnosis Code(s):        --- Professional ---                           K20.90, Esophagitis, unspecified without bleeding                           K29.70, Gastritis, unspecified, without bleeding                           K92.0, Hematemesis CPT copyright 2019 [employer]. All rights reserved. The codes documented in this report are preliminary and upon coder review may  be revised to meet current compliance requirements. Arta Silence, MD 08/11/2021 11:36:16 AM This report has been signed electronically. Number of Addenda: 0

## 2021-08-11 NOTE — Anesthesia Postprocedure Evaluation (Signed)
Anesthesia Post Note  Patient: Patrick Bishop  Procedure(s) Performed: ESOPHAGOGASTRODUODENOSCOPY (EGD)     Patient location during evaluation: PACU Anesthesia Type: General Level of consciousness: awake and alert Pain management: pain level controlled Vital Signs Assessment: post-procedure vital signs reviewed and stable Respiratory status: spontaneous breathing, nonlabored ventilation, respiratory function stable and patient connected to nasal cannula oxygen Cardiovascular status: blood pressure returned to baseline and stable Postop Assessment: no apparent nausea or vomiting Anesthetic complications: no   No notable events documented.  Last Vitals:  Vitals:   08/11/21 1210 08/11/21 1247  BP: (!) 119/48 127/80  Pulse: 79 76  Resp: (!) 21 20  Temp:  36.5 C  SpO2: 98% 96%    Last Pain:  Vitals:   08/11/21 1247  TempSrc: Oral  PainSc:                  Krina Mraz,E. Cherita Hebel

## 2021-08-11 NOTE — Progress Notes (Addendum)
PROGRESS NOTE    Patrick Bishop  WIO:[id number] DOB: [birth year] DOA: 08/09/2021 PCP: Lujean Amel, MD   Chief Complaint  Patient presents with   Leg Swelling    Brief Narrative:  80 year old male with history of atrial fibrillation on anticoagulation, chronic diastolic heart failure, hypertension, history of bladder cancer s/p stenting on the left ureter secondary to hydronephrosis.  Presents with bilateral lower extremity edema, shortness of breath, unable to ambulate. He was admitted for acute on chronic diastolic heart failure and also was found to have a urinary tract infection. Hospital course complicated by multiple episodes of coffee-ground emesis. Gastroenterology consulted and he is scheduled for EGD this morning.   Assessment & Plan:   Active Problems:   Generalized weakness   Pressure injury of skin   Acute on chronic diastolic heart failure Improving Patient was started on IV Lasix 80 mg twice daily. Patient has significant bilateral leg edema, shortness of breath on talking. Diuresed about 2 L since admission.  Continue with strict intake and output and daily weights. Echocardiogram shows Left ventricular ejection fraction is 60 to 65%.,has normal function, no regional wall motion abnormalities. There is mild concentric left ventricular hypertrophy. Left ventricular diastolic parameters are indeterminate. There is the interventricular septum is  flattened in systole and diastole, consistent with right ventricular pressure and volume overload.  ABIs show Resting right ankle-brachial index is within normal range. No  evidence of significant right lower extremity arterial disease.  Left: Resting left ankle-brachial index indicates noncompressible left lower extremity arteries.     Gram-negative UTI Patient is on Rocephin continue the same.    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 Follows up with Dr.  Alen Blew. Follows up with Dr. Lovena Neighbours for stent exchange every 3 months. Urology has seen the patient recommended 14 days of antibiotics followed by prophylactic antibiotic with either Macrobid or trimethoprim once acute infection has been treated. Renal ultrasound showsMild left-sided hydronephrosis.   Permanent atrial fibrillation CHA2DS2-VASc score of 5 on Xarelto for anticoagulation and rate control with Toprol. Xarelto on hold for coffee-ground emesis/hematemesis.    Pressure injury present on admission Pressure Injury 08/09/21 Buttocks Left Stage 2 -  Partial thickness loss of dermis presenting as a shallow open injury with a red, pink wound bed without slough. Purple non-blancheable area surrounding stage 2, MASD to area also (Active)  08/09/21 1800  Location: Buttocks  Location Orientation: Left  Staging: Stage 2 -  Partial thickness loss of dermis presenting as a shallow open injury with a red, pink wound bed without slough.  Wound Description (Comments): Purple non-blancheable area surrounding stage 2, MASD to area also  Present on Admission: Yes     Pressure Injury 08/09/21 Heel Left Deep Tissue Pressure Injury - Purple or maroon localized area of discolored intact skin or blood-filled blister due to damage of underlying soft tissue from pressure and/or shear. (Active)  08/09/21 1745  Location: Heel  Location Orientation: Left  Staging: Deep Tissue Pressure Injury - Purple or maroon localized area of discolored intact skin or blood-filled blister due to damage of underlying soft tissue from pressure and/or shear.  Wound Description (Comments):   Present on Admission: Yes  Wound care consulted and recommendations given.    Stage IIIb CKD Creatinine appears to be at baseline.    Anemia of chronic disease/acute anemia from hematemesis/coffee-ground emesis. Baseline hemoglobin appears to be between 9-11.  Patient was admitted with a hemoglobin of 9.5, and hemoglobin this  morning has been stable around 9. Transfuse to keep hemoglobin greater than 7.    Obstructive sleep apnea Continue with CPAP.   Morbid obesity Body mass index is 40.14 kg/m. Increased risk of morbidity and mortality.    Generalized weakness/unable to ambulate due to lower extremity edema Therapy evaluations recommending SNF.   Severe hypokalemia Potassium supplementation added Get magnesium levels. Recheck in the morning.   DVT prophylaxis: Was on Xarelto which is on hold.   Code Status: Partial code Family Communication: None at bedside Disposition:   Status is: Inpatient  Remains inpatient appropriate because: IV antibiotics, EGD       Consultants:  -Gastroenterology Dr. Paulita Fujita  Procedures: EGD ABI Echocardiogram Antimicrobials:  Antibiotics Given (last 72 hours)     Date/Time Action Medication Dose Rate   08/09/21 1239 New Bag/Given   cefTRIAXone (ROCEPHIN) 1 g in sodium chloride 0.9 % 100 mL IVPB 1 g 200 mL/hr   08/10/21 1121 New Bag/Given   [MAR Hold] cefTRIAXone (ROCEPHIN) 2 g in sodium chloride 0.9 % 100 mL IVPB (MAR Hold since Wed 08/11/2021 at 1035.Hold Reason: Transfer to a Procedural area) 2 g 200 mL/hr         Subjective: Patient reports feeling weak/5 episodes of coffee-ground emesis in the last night. No chest pain, shortness of breath is improving.  Objective: Vitals:   08/11/21 0616 08/11/21 0757 08/11/21 1037 08/11/21 1038  BP: 121/72 115/61 124/71   Pulse: 84 88 90   Resp: 20 18 (!) 22   Temp: 98.2 F (36.8 C) 98.4 F (36.9 C) 98.5 F (36.9 C)   TempSrc: Oral Oral Oral   SpO2: 93% 94% 90% 95%  Weight:   (!) 138 kg   Height:   6\' 1"  (1.854 m)     Intake/Output Summary (Last 24 hours) at 08/11/2021 1056 Last data filed at 08/10/2021 2100 Gross per 24 hour  Intake 100 ml  Output 800 ml  Net -700 ml   Filed Weights   08/09/21 1736 08/10/21 0426 08/11/21 1037  Weight: (!) 137.3 kg (!) 138 kg (!) 138 kg     Examination:  General exam: Appears calm and comfortable  Respiratory system: Diminished breath sounds at bases no wheezing or rhonchi Cardiovascular system: S1 & S2 heard, irregular, JVD cannot be appreciated, 3+ leg edema. Gastrointestinal system: Abdomen is nondistended, soft and nontender. Normal bowel sounds heard. Central nervous system: Alert and oriented.  Very hard of hearing, grossly nonfocal Extremities: 3+ leg edema Skin: No rashes, lesions or ulcers Psychiatry:  Mood & affect appropriate.     Data Reviewed: I have personally reviewed following labs and imaging studies  CBC: Recent Labs  Lab 08/09/21 0924 08/10/21 0351 08/10/21 1853 08/11/21 0058  WBC 7.7 6.2  --  7.1  HGB 9.5* 8.4* 9.4* 9.5*  HCT 29.3* 26.4* 29.4* 29.5*  MCV 91.6 93.3  --  93.4  PLT 207 177  --  106    Basic Metabolic Panel: Recent Labs  Lab 08/09/21 0924 08/10/21 0351 08/11/21 0058  NA 137 139 141  K 3.2* 2.9* 2.9*  CL 100 103 102  CO2 26 27 28   GLUCOSE 158* 149* 174*  BUN 36* 37* 37*  CREATININE 2.17* 2.03* 1.89*  CALCIUM 8.5* 8.0* 8.1*  MG 2.2  --   --     GFR: Estimated Creatinine Clearance: 44.7 mL/min (A) (by C-G formula based on SCr of 1.89 mg/dL (H)).  Liver Function Tests: Recent Labs  Lab 08/09/21 2694 08/10/21  0351  AST 23 19  ALT 14 14  ALKPHOS 72 60  BILITOT 1.6* 1.0  PROT 7.6 6.4*  ALBUMIN 3.4* 2.6*    CBG: No results for input(s): GLUCAP in the last 168 hours.   Recent Results (from the past 240 hour(s))  Urine Culture     Status: Abnormal   Collection Time: 08/09/21 10:17 AM   Specimen: Urine, Catheterized  Result Value Ref Range Status   Specimen Description   Final    URINE, CATHETERIZED Performed at [facility] [address] [place], [place] [address]    Special Requests   Final    NONE Performed at [facility], [place] [address] [place], [place] [address]    Culture >=100,000 COLONIES/mL  ESCHERICHIA COLI (A)  Final   Report Status 08/11/2021 FINAL  Final   Organism ID, Bacteria ESCHERICHIA COLI (A)  Final      Susceptibility   Escherichia coli - MIC*    AMPICILLIN 8 SENSITIVE Sensitive     CEFAZOLIN <=4 SENSITIVE Sensitive     CEFEPIME <=0.12 SENSITIVE Sensitive     CEFTRIAXONE <=0.25 SENSITIVE Sensitive     CIPROFLOXACIN <=0.25 SENSITIVE Sensitive     GENTAMICIN <=1 SENSITIVE Sensitive     IMIPENEM <=0.25 SENSITIVE Sensitive     NITROFURANTOIN <=16 SENSITIVE Sensitive     TRIMETH/SULFA <=20 SENSITIVE Sensitive     AMPICILLIN/SULBACTAM <=2 SENSITIVE Sensitive     PIP/TAZO <=4 SENSITIVE Sensitive     * >=100,000 COLONIES/mL ESCHERICHIA COLI  Resp Panel by RT-PCR (Flu A&B, Covid) Nasopharyngeal Swab     Status: None   Collection Time: 08/09/21 10:53 AM   Specimen: Nasopharyngeal Swab; Nasopharyngeal(NP) swabs in vial transport medium  Result Value Ref Range Status   SARS Coronavirus 2 by RT PCR NEGATIVE NEGATIVE Final    Comment: (NOTE) SARS-CoV-2 target nucleic acids are NOT DETECTED.  The SARS-CoV-2 RNA is generally detectable in upper respiratory specimens during the acute phase of infection. The lowest concentration of SARS-CoV-2 viral copies this assay can detect is 138 copies/mL. A negative result does not preclude SARS-Cov-2 infection and should not be used as the sole basis for treatment or other patient management decisions. A negative result may occur with  improper specimen collection/handling, submission of specimen other than nasopharyngeal swab, presence of viral mutation(s) within the areas targeted by this assay, and inadequate number of viral copies(<138 copies/mL). A negative result must be combined with clinical observations, patient history, and epidemiological information. The expected result is Negative.  Fact Sheet for Patients:  EntrepreneurPulse.com.au  Fact Sheet for Healthcare Providers:   IncredibleEmployment.be  This test is no t yet approved or cleared by the [place] FDA and  has been authorized for detection and/or diagnosis of SARS-CoV-2 by FDA under an Emergency Use Authorization (EUA). This EUA will remain  in effect (meaning this test can be used) for the duration of the COVID-19 declaration under Section 564(b)(1) of the Act, 21 U.S.C.section 360bbb-3(b)(1), unless the authorization is terminated  or revoked sooner.       Influenza A by PCR NEGATIVE NEGATIVE Final   Influenza B by PCR NEGATIVE NEGATIVE Final    Comment: (NOTE) The Xpert Xpress SARS-CoV-2/FLU/RSV plus assay is intended as an aid in the diagnosis of influenza from Nasopharyngeal swab specimens and should not be used as a sole basis for treatment. Nasal washings and aspirates are unacceptable for Xpert Xpress SARS-CoV-2/FLU/RSV testing.  Fact Sheet for Patients: EntrepreneurPulse.com.au  Fact  Sheet for Healthcare Providers: IncredibleEmployment.be  This test is not yet approved or cleared by the [place] and has been authorized for detection and/or diagnosis of SARS-CoV-2 by FDA under an Emergency Use Authorization (EUA). This EUA will remain in effect (meaning this test can be used) for the duration of the COVID-19 declaration under Section 564(b)(1) of the Act, 21 U.S.C. section 360bbb-3(b)(1), unless the authorization is terminated or revoked.  Performed at [facility], [place] [address] [place], [place] [address]          Radiology Studies: DG Abd 1 View  Result Date: 08/11/2021 CLINICAL DATA:  Nausea and vomiting EXAM: ABDOMEN - 1 VIEW COMPARISON:  None. FINDINGS: The stomach is significantly distended with air. Left ureteral stent is noted in satisfactory position. Bilateral venous stents are noted and stable. No free air is seen. Degenerative changes of lumbar spine are noted. IMPRESSION:  Significant gaseous distension of the stomach. No other acute abnormality is seen. Electronically Signed   By: Inez Catalina M.D.   On: 08/11/2021 03:02   US RENAL  Result Date: 08/10/2021 CLINICAL DATA:  UTI. EXAM: RENAL / URINARY TRACT ULTRASOUND COMPLETE COMPARISON:  None. FINDINGS: Right Kidney: Renal measurements: 11.3 cm x 6.4 cm x 6.5 cm = volume: 245.8 mL. Echogenicity within normal limits. No mass or hydronephrosis visualized. Left Kidney: Renal measurements: 10.8 cm x 5.0 cm x 5.5 cm = volume: 154.3 mL. Echogenicity within normal limits. No mass is visualized. Mild left-sided hydronephrosis is seen. Bladder: The urinary bladder is empty. Other: None. IMPRESSION: Mild left-sided hydronephrosis. Electronically Signed   By: Virgina Norfolk M.D.   On: 08/10/2021 19:27   VAS [place] ABI WITH/WO TBI  Result Date: 08/10/2021  LOWER EXTREMITY DOPPLER STUDY Patient Name:  DAKWON WENBERG  Date of Exam:   08/10/2021 Medical Rec #: [id number]        Accession #:    [id number] Date of Birth: May 15, 1941        Patient Gender: M Patient Age:   4 years Exam Location:  [facility] Procedure:      VAS [place] ABI WITH/WO TBI Referring Phys: Jolaine Artist MEMON --------------------------------------------------------------------------------  Indications: Peripheral artery disease. High Risk Factors: Hypertension, hyperlipidemia. Other Factors: Chronic venous insufficiency.  Limitations: Today's exam was limited due to an open wound and patient body              habitus. Comparison Study: No prior studies. Performing Technologist: Carlos Levering RVT  Examination Guidelines: A complete evaluation includes at minimum, Doppler waveform signals and systolic blood pressure reading at the level of bilateral brachial, anterior tibial, and posterior tibial arteries, when vessel segments are accessible. Bilateral testing is considered an integral part of a complete examination. Photoelectric Plethysmograph (PPG) waveforms and toe  systolic pressure readings are included as required and additional duplex testing as needed. Limited examinations for reoccurring indications may be performed as noted.  ABI Findings: +--------+------------------+-----+---------+--------+ Right   Rt Pressure (mmHg)IndexWaveform Comment  +--------+------------------+-----+---------+--------+ [id number]                    triphasic         +--------+------------------+-----+---------+--------+ PTA     132               1.13 biphasic          +--------+------------------+-----+---------+--------+ DP      102               0.87 biphasic          +--------+------------------+-----+---------+--------+ +--------+------------------+-----+---------+-------+  Left    Lt Pressure (mmHg)IndexWaveform Comment +--------+------------------+-----+---------+-------+ [id number]                    triphasic        +--------+------------------+-----+---------+-------+ PTA     254               2.17 biphasic         +--------+------------------+-----+---------+-------+ DP      131               1.12 biphasic         +--------+------------------+-----+---------+-------+ +-------+-----------+-----------+------------+------------+ ABI/TBIToday's ABIToday's TBIPrevious ABIPrevious TBI +-------+-----------+-----------+------------+------------+ Right  1.13                                           +-------+-----------+-----------+------------+------------+ Left   [place]                                             +-------+-----------+-----------+------------+------------+   Summary: Right: Resting right ankle-brachial index is within normal range. No evidence of significant right lower extremity arterial disease. Left: Resting left ankle-brachial index indicates noncompressible left lower extremity arteries.  *See table(s) above for measurements and observations.  Electronically signed by Deitra Mayo MD on 08/10/2021 at  1:53:29 PM.    Final    ECHOCARDIOGRAM COMPLETE  Result Date: 08/10/2021    ECHOCARDIOGRAM REPORT   Patient Name:   NICKOLAOS BRALLIER Date of Exam: 08/10/2021 Medical Rec #:  [id number]       Height:       73.0 in Accession #:    [id number]      Weight:       304.2 lb Date of Birth:  15-Jan-1941       BSA:          2.572 m Patient Age:    11 years        BP:           104/50 mmHg Patient Gender: M               HR:           74 bpm. Exam Location:  Inpatient Procedure: 2D Echo, Color Doppler, Cardiac Doppler and Intracardiac            Opacification Agent Indications:    CHF  History:        Patient has prior history of Echocardiogram examinations. CHF,                 Pulmonary HTN; Risk Factors:Hypertension and Sleep Apnea.  Sonographer:    Jyl Heinz Referring Phys: [id number] [place]  1. Left ventricular ejection fraction, by estimation, is 60 to 65%. The left ventricle has normal function. The left ventricle has no regional wall motion abnormalities. There is mild concentric left ventricular hypertrophy. Left ventricular diastolic parameters are indeterminate. There is the interventricular septum is flattened in systole and diastole, consistent with right ventricular pressure and volume overload.  2. Right ventricular systolic function is low normal. The right ventricular size is severely enlarged.  3. Left atrial size was moderately dilated.  4. Right atrial size was severely dilated.  5. The mitral valve is normal in structure. No evidence of mitral valve regurgitation. No evidence of  mitral stenosis.  6. Tricuspid valve regurgitation is moderate.  7. The aortic valve is tricuspid. Aortic valve regurgitation is not visualized. No aortic stenosis is present.  8. There is color/spectral Doppler discrepancy in pulmonic regurgitation signal, study may underestimated the level of regurgitation. FINDINGS  Left Ventricle: Left ventricular ejection fraction, by estimation, is 60 to 65%. The left  ventricle has normal function. The left ventricle has no regional wall motion abnormalities. The left ventricular internal cavity size was normal in size. There is  mild concentric left ventricular hypertrophy. The interventricular septum is flattened in systole and diastole, consistent with right ventricular pressure and volume overload. Left ventricular diastolic parameters are indeterminate. Right Ventricle: The right ventricular size is severely enlarged. No increase in right ventricular wall thickness. Right ventricular systolic function is low normal. Left Atrium: Left atrial size was moderately dilated. Right Atrium: Right atrial size was severely dilated. Pericardium: There is no evidence of pericardial effusion. Mitral Valve: The mitral valve is normal in structure. No evidence of mitral valve regurgitation. No evidence of mitral valve stenosis. Tricuspid Valve: The tricuspid valve is normal in structure. Tricuspid valve regurgitation is moderate . No evidence of tricuspid stenosis. Aortic Valve: The aortic valve is tricuspid. Aortic valve regurgitation is not visualized. No aortic stenosis is present. Aortic valve peak gradient measures 8.4 mmHg. Pulmonic Valve: The pulmonic valve was not well visualized. Pulmonic valve regurgitation is mild. No evidence of pulmonic stenosis. Aorta: The aortic root and ascending aorta are structurally normal, with no evidence of dilitation. Pulmonary Artery: There is color/spectral Doppler discrepancy in pulmonic regurgitation signal, study may underestimated the level of regurgitation. IAS/Shunts: The atrial septum is grossly normal.  LEFT VENTRICLE PLAX 2D LVIDd:         5.90 cm      Diastology LVIDs:         3.80 cm      LV e' medial:    8.92 cm/s LV PW:         1.30 cm      LV E/e' medial:  11.1 LV IVS:        1.20 cm      LV e' lateral:   11.20 cm/s LVOT diam:     2.00 cm      LV E/e' lateral: 8.8 LV SV:         64 LV SV Index:   25 LVOT Area:     3.14 cm  LV Volumes  (MOD) LV vol d, MOD A2C: 142.0 ml LV vol d, MOD A4C: 131.0 ml LV vol s, MOD A2C: 54.6 ml LV vol s, MOD A4C: 50.4 ml LV SV MOD A2C:     87.4 ml LV SV MOD A4C:     131.0 ml LV SV MOD BP:      86.7 ml RIGHT VENTRICLE             IVC RV Basal diam:  5.50 cm     IVC diam: 2.10 cm RV Mid diam:    4.10 cm RV S prime:     11.70 cm/s TAPSE (M-mode): 2.0 cm LEFT ATRIUM              Index        RIGHT ATRIUM           Index LA diam:        4.70 cm  1.83 cm/m   RA Area:     35.20 cm LA Vol (A2C):  96.8 ml  37.64 ml/m  RA Volume:   137.00 ml 53.28 ml/m LA Vol (A4C):   108.0 ml 42.00 ml/m LA Biplane Vol: 104.0 ml 40.44 ml/m  AORTIC VALVE AV Area (Vmax): 2.53 cm AV Vmax:        145.00 cm/s AV Peak Grad:   8.4 mmHg LVOT Vmax:      117.00 cm/s LVOT Vmean:     85.900 cm/s LVOT VTI:       0.204 m  AORTA Ao Root diam: 3.50 cm Ao Asc diam:  3.30 cm MITRAL VALVE               TRICUSPID VALVE MV Area (PHT): 3.66 cm    TR Peak grad:   41.0 mmHg MV Decel Time: 207 msec    TR Vmax:        320.00 cm/s MV E velocity: 99.00 cm/s                            SHUNTS                            Systemic VTI:  0.20 m                            Systemic Diam: 2.00 cm Rudean Haskell MD Electronically signed by Rudean Haskell MD Signature Date/Time: 08/10/2021/3:36:58 PM    Final         Scheduled Meds:  [MAR Hold] atorvastatin  10 mg Oral Daily   [MAR Hold] docusate sodium  100 mg Oral BID   [MAR Hold] furosemide  80 mg Intravenous BID   [MAR Hold] Gerhardt's butt cream   Topical TID   [MAR Hold] metoprolol tartrate  25 mg Oral Daily   [MAR Hold] metoprolol tartrate  37.5 mg Oral QHS   [MAR Hold] pantoprazole (PROTONIX) IV  40 mg Intravenous Q12H   [MAR Hold] potassium chloride  40 mEq Oral Daily   [MAR Hold] tamsulosin  0.4 mg Oral Daily   Continuous Infusions:  sodium chloride     [MAR Hold] cefTRIAXone (ROCEPHIN)  IV 2 g (08/10/21 1121)   lactated ringers 10 mL/hr at 08/11/21 1051     LOS: 2 days         [name], MD [facility]   To contact the attending provider between 7A-7P or the covering provider during after hours 7P-7A, please log into the web site [email or web address] and access using universal  [place] password for that web site. If you do not have the password, please call the hospital operator.  08/11/2021, 10:56 AM

## 2021-08-11 NOTE — Consult Note (Signed)
WOC Nurse Consult Note: Reason for Consult:Consulted for areas of intertriginous dermatitis  Wound type: Moisture Pressure Injury POA: N/A Measurement:N/A  Diffuse areas of intertriginous dermatitis to the subpannicular area, also buttock and posterior IAD.  ICD-10 CM Codes for Irritant Dermatitis  L24A2 - Due to fecal, urinary or dual incontinence L30.4  - Erythema intertrigo. Also used for abrasion of the hand, chafing of the skin, dermatitis due to sweating and friction, friction dermatitis, friction eczema, and genital/thigh intertrigo.    Wound bed:Red, moist  Drainage (amount, consistency, odor) scant serous Periwound: macerated Dressing procedure/placement/frequency: With input from PT K. Hill I have provided for a POC aimed at moisture management to include turning and repositioning guidance, a mattress replacement with low air loss feature, and use of a therapeutic linen underpad. Gerhart's Butt cream is provided for its barrier, antifungal and antiinflammatory properties.  The house wicking antimicrobial textile is to be used in the subpanniular skin fold.  [place] nursing team will not follow, but will remain available to this patient, the nursing and medical teams.  Please re-consult if needed. Thanks, Maudie Flakes, MSN, RN, [place], Arther Abbott  Pager# [phone]

## 2021-08-11 NOTE — Addendum Note (Signed)
Addendum  created 08/11/21 1346 by Talbot Grumbling, CRNA   Clinical Note Signed, Intraprocedure Blocks edited, SmartForm saved

## 2021-08-11 NOTE — Anesthesia Procedure Notes (Addendum)
Procedure Name: Intubation Date/Time: 08/11/2021 11:14 AM Performed by: Talbot Grumbling, CRNA Pre-anesthesia Checklist: Patient identified, Emergency Drugs available, Suction available and Patient being monitored Patient Re-evaluated:Patient Re-evaluated prior to induction Oxygen Delivery Method: Circle system utilized Preoxygenation: Pre-oxygenation with 100% oxygen Induction Type: IV induction, Rapid sequence and Cricoid Pressure applied Laryngoscope Size: Mac and 4 Grade View: Grade I Tube type: Oral Tube size: 7.5 mm Number of attempts: 1 Airway Equipment and Method: Stylet Placement Confirmation: ETT inserted through vocal cords under direct vision, positive ETCO2 and breath sounds checked- equal and bilateral Secured at: 23 cm Tube secured with: Tape Dental Injury: Teeth and Oropharynx as per pre-operative assessment

## 2021-08-11 NOTE — Transfer of Care (Signed)
Immediate Anesthesia Transfer of Care Note  Patient: Patrick Bishop  Procedure(s) Performed: ESOPHAGOGASTRODUODENOSCOPY (EGD)  Patient Location: PACU  Anesthesia Type:General  Level of Consciousness: sedated  Airway & Oxygen Therapy: Patient Spontanous Breathing and Patient connected to face mask oxygen  Post-op Assessment: Report given to RN and Post -op Vital signs reviewed and stable  Post vital signs: Reviewed and stable  Last Vitals:  Vitals Value Taken Time  BP    Temp    Pulse 93 08/11/21 1138  Resp 28 08/11/21 1138  SpO2 100 % 08/11/21 1138  Vitals shown include unvalidated device data.  Last Pain:  Vitals:   08/11/21 1037  TempSrc: Oral  PainSc: 0-No pain      Patients Stated Pain Goal: 0 (12/14/20 4825)  Complications: No notable events documented.

## 2021-08-11 NOTE — Progress Notes (Signed)
Bilateral lower extremity venous duplex has been completed. Preliminary results can be found in CV Proc through chart review.   08/11/21 2:21 PM Patrick Bishop RVT

## 2021-08-11 NOTE — TOC Progression Note (Signed)
Transition of Care [facility]) - Progression Note    Patient Details  Name: Nathaniel Wakeley MRN: [id number] Date of Birth: 03-14-1941  [facility] CM/SW Contact  Purcell Mouton, RN Phone Number: 08/11/2021, 12:38 PM  Clinical Narrative:    Pt from [place]. Will continue to follow for SNF placement. Pt is not medically stable for discharge.    Expected Discharge Plan: [place] Barriers to Discharge: No Barriers Identified  Expected Discharge Plan and Services Expected Discharge Plan: [place] arrangements for the past 2 months: [place]                                       Social Determinants of Health (SDOH) Interventions    Readmission Risk Interventions No flowsheet data found.

## 2021-08-12 ENCOUNTER — Encounter (HOSPITAL_COMMUNITY): Payer: Self-pay | Admitting: Gastroenterology

## 2021-08-12 DIAGNOSIS — I5042 Chronic combined systolic (congestive) and diastolic (congestive) heart failure: Secondary | ICD-10-CM | POA: Diagnosis not present

## 2021-08-12 DIAGNOSIS — I872 Venous insufficiency (chronic) (peripheral): Secondary | ICD-10-CM | POA: Diagnosis not present

## 2021-08-12 DIAGNOSIS — N390 Urinary tract infection, site not specified: Secondary | ICD-10-CM | POA: Diagnosis not present

## 2021-08-12 DIAGNOSIS — R531 Weakness: Secondary | ICD-10-CM | POA: Diagnosis not present

## 2021-08-12 LAB — CBC
HCT: 33.7 % — ABNORMAL LOW (ref 39.0–52.0)
Hemoglobin: 10.6 g/dL — ABNORMAL LOW (ref 13.0–17.0)
MCH: 29.3 pg (ref 26.0–34.0)
MCHC: 31.5 g/dL (ref 30.0–36.0)
MCV: 93.1 fL (ref 80.0–100.0)
Platelets: 276 10*3/uL (ref 150–400)
RBC: 3.62 MIL/uL — ABNORMAL LOW (ref 4.22–5.81)
RDW: 14.4 % (ref 11.5–15.5)
WBC: 7.3 10*3/uL (ref 4.0–10.5)
nRBC: 0 % (ref 0.0–0.2)

## 2021-08-12 LAB — BASIC METABOLIC PANEL
Anion gap: 9 (ref 5–15)
BUN: 47 mg/dL — ABNORMAL HIGH (ref 8–23)
CO2: 34 mmol/L — ABNORMAL HIGH (ref 22–32)
Calcium: 8.6 mg/dL — ABNORMAL LOW (ref 8.9–10.3)
Chloride: 102 mmol/L (ref 98–111)
Creatinine, Ser: 1.76 mg/dL — ABNORMAL HIGH (ref 0.61–1.24)
GFR, Estimated: 38 mL/min — ABNORMAL LOW (ref >60)
Glucose, Bld: 163 mg/dL — ABNORMAL HIGH (ref 70–99)
Potassium: 3.3 mmol/L — ABNORMAL LOW (ref 3.5–5.1)
Sodium: 145 mmol/L (ref 135–145)

## 2021-08-12 MED ORDER — CEFADROXIL 500 MG PO CAPS
500.0000 mg | ORAL_CAPSULE | Freq: Two times a day (BID) | ORAL | Status: DC
  Administered 2021-08-13: 500 mg via ORAL
  Filled 2021-08-12 (×2): qty 1

## 2021-08-12 MED ORDER — POTASSIUM CHLORIDE CRYS ER 20 MEQ PO TBCR
40.0000 meq | EXTENDED_RELEASE_TABLET | Freq: Two times a day (BID) | ORAL | Status: DC
  Administered 2021-08-12 – 2021-08-13 (×3): 40 meq via ORAL
  Filled 2021-08-12 (×3): qty 2

## 2021-08-12 NOTE — Progress Notes (Signed)
PROGRESS NOTE    Patrick Bishop  RDE:[id number] DOB: 11-Jun-1941 DOA: 08/09/2021 PCP: Lujean Amel, MD   Chief Complaint  Patient presents with   Leg Swelling    Brief Narrative:  80 year old male with history of atrial fibrillation on anticoagulation, chronic diastolic heart failure, hypertension, history of bladder cancer s/p stenting on the left ureter secondary to hydronephrosis.  Presents with bilateral lower extremity edema, shortness of breath, unable to ambulate. He was admitted for acute on chronic diastolic heart failure and also was found to have a urinary tract infection. Hospital course complicated by multiple episodes of coffee-ground emesis. Gastroenterology consulted and he underwent EGD showing LA Grade D esophagitis with no bleeding. Highly likely source of hematemesis.Gastritis. recommended holding Xarelto for 7 days, soft diet and IV PPI for 48 hours.    Assessment & Plan:   Active Problems:   Generalized weakness   Pressure injury of skin   Acute on chronic diastolic heart failure Improving with improving pedal edema.  Patient was started on IV Lasix 80 mg twice daily. Patient has significant bilateral leg edema, shortness of breath on talking. Diuresed about 4 L since admission.  Continue with strict intake and output and daily weights. Echocardiogram shows Left ventricular ejection fraction is 60 to 65%.,has normal function, no regional wall motion abnormalities. There is mild concentric left ventricular hypertrophy. Left ventricular diastolic parameters are indeterminate. There is the interventricular septum is  flattened in systole and diastole, consistent with right ventricular pressure and volume overload.  ABIs show Resting right ankle-brachial index is within normal range. No  evidence of significant right lower extremity arterial disease. Left: Resting left ankle-brachial index indicates noncompressible left lower extremity  arteries.     Gram-negative UTI Transition to oral antibiotics on discharge.    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 Follows up with Dr. Alen Blew. Follows up with Dr. Lovena Neighbours for stent exchange every 3 months. Urology has seen the patient recommended 14 days of antibiotics followed by prophylactic antibiotic with either Macrobid or trimethoprim once acute infection has been treated. Renal ultrasound shows Mild left-sided hydronephrosis.   Permanent atrial fibrillation CHA2DS2-VASc score of 5 on Xarelto for anticoagulation and rate control with Toprol. Xarelto on hold for coffee-ground emesis/hematemesis for 7 days.      Pressure injury present on admission Pressure Injury 08/09/21 Buttocks Left Stage 2 -  Partial thickness loss of dermis presenting as a shallow open injury with a red, pink wound bed without slough. Purple non-blancheable area surrounding stage 2, MASD to area also (Active)  08/09/21 1800  Location: Buttocks  Location Orientation: Left  Staging: Stage 2 -  Partial thickness loss of dermis presenting as a shallow open injury with a red, pink wound bed without slough.  Wound Description (Comments): Purple non-blancheable area surrounding stage 2, MASD to area also  Present on Admission: Yes     Pressure Injury 08/09/21 Heel Left Deep Tissue Pressure Injury - Purple or maroon localized area of discolored intact skin or blood-filled blister due to damage of underlying soft tissue from pressure and/or shear. (Active)  08/09/21 1745  Location: Heel  Location Orientation: Left  Staging: Deep Tissue Pressure Injury - Purple or maroon localized area of discolored intact skin or blood-filled blister due to damage of underlying soft tissue from pressure and/or shear.  Wound Description (Comments):   Present on Admission: Yes  Wound care consulted and recommendations given.    Stage IIIb CKD Creatinine better than  baseline.    Anemia of chronic disease/acute anemia from hematemesis/coffee-ground emesis. Baseline hemoglobin appears to be between 9-11.  Patient was admitted with a hemoglobin of 9.5, and improving.  Transfuse to keep hemoglobin greater than 7.    Obstructive sleep apnea Continue with CPAP.   Morbid obesity Body mass index is 38.68 kg/m. Increased risk of morbidity and mortality.    Generalized weakness/unable to ambulate due to lower extremity edema Therapy evaluations recommending SNF.   Severe hypokalemia Potassium supplementation added Normal mag levels.  Recheck BMP  in the morning.   DVT prophylaxis: Was on Xarelto which is on hold.   Code Status: Partial code Family Communication: None at bedside Disposition:   Status is: Inpatient  Remains inpatient appropriate because: IV antibiotics, EGD       Consultants:  -Gastroenterology Dr. Paulita Fujita  Procedures: EGD ABI Echocardiogram Antimicrobials:  Antibiotics Given (last 72 hours)     Date/Time Action Medication Dose Rate   08/10/21 1121 New Bag/Given   cefTRIAXone (ROCEPHIN) 2 g in sodium chloride 0.9 % 100 mL IVPB 2 g 200 mL/hr   08/11/21 1344 New Bag/Given   cefTRIAXone (ROCEPHIN) 2 g in sodium chloride 0.9 % 100 mL IVPB 2 g 200 mL/hr   08/12/21 0906 New Bag/Given   cefTRIAXone (ROCEPHIN) 2 g in sodium chloride 0.9 % 100 mL IVPB 2 g 200 mL/hr         Subjective: No hematemesis, no chest pain or sob, improving leg edema.   Objective: Vitals:   08/11/21 1247 08/11/21 2108 08/12/21 0500 08/12/21 0513  BP: 127/80 112/61  126/67  Pulse: 76 68  87  Resp: 20 19  18   Temp: 97.7 F (36.5 C) 97.6 F (36.4 C)  97.7 F (36.5 C)  TempSrc: Oral Oral  Oral  SpO2: 96% 96%  94%  Weight:   133 kg   Height:        Intake/Output Summary (Last 24 hours) at 08/12/2021 1528 Last data filed at 08/12/2021 0330 Gross per 24 hour  Intake --  Output 1150 ml  Net -1150 ml    Filed Weights    08/10/21 0426 08/11/21 1037 08/12/21 0500  Weight: (!) 138 kg (!) 138 kg 133 kg    Examination:  General exam: Appears calm and comfortable  Respiratory system: Clear to auscultation. Respiratory effort normal. Cardiovascular system: S1 & S2 heard, RRR. No JVD, improving leg edema.  Gastrointestinal system: Abdomen is nondistended, soft and nontender.  Normal bowel sounds heard. Central nervous system: Alert and oriented. No focal neurological deficits. Extremities: Symmetric 5 x 5 power. Skin:stage 2 pressure injury on the buttock.  Psychiatry: Mood & affect appropriate.      Data Reviewed: I have personally reviewed following labs and imaging studies  CBC: Recent Labs  Lab 08/09/21 0924 08/10/21 0351 08/10/21 1853 08/11/21 0058 08/12/21 0339  WBC 7.7 6.2  --  7.1 7.3  HGB 9.5* 8.4* 9.4* 9.5* 10.6*  HCT 29.3* 26.4* 29.4* 29.5* 33.7*  MCV 91.6 93.3  --  93.4 93.1  PLT 207 177  --  206 276     Basic Metabolic Panel: Recent Labs  Lab 08/09/21 0924 08/10/21 0351 08/11/21 0058 08/11/21 1404 08/12/21 0339  NA 137 139 141  --  145  K 3.2* 2.9* 2.9* 2.9* 3.3*  CL 100 103 102  --  102  CO2 26 27 28   --  34*  GLUCOSE 158* 149* 174*  --  163*  BUN 36* 37* 37*  --  47*  CREATININE 2.17* 2.03* 1.89*  --  1.76*  CALCIUM 8.5* 8.0* 8.1*  --  8.6*  MG 2.2  --  2.1  --   --      GFR: Estimated Creatinine Clearance: 47.1 mL/min (A) (by C-G formula based on SCr of 1.76 mg/dL (H)).  Liver Function Tests: Recent Labs  Lab 08/09/21 0924 08/10/21 0351  AST 23 19  ALT 14 14  ALKPHOS 72 60  BILITOT 1.6* 1.0  PROT 7.6 6.4*  ALBUMIN 3.4* 2.6*     CBG: No results for input(s): GLUCAP in the last 168 hours.   Recent Results (from the past 240 hour(s))  Urine Culture     Status: Abnormal   Collection Time: 08/09/21 10:17 AM   Specimen: Urine, Catheterized  Result Value Ref Range Status   Specimen Description   Final    URINE, CATHETERIZED Performed at [facility] [address], [place], [place] [address]    Special Requests   Final    NONE Performed at [facility], [place] [address], [place], [place] [address]    Culture >=100,000 COLONIES/mL ESCHERICHIA COLI (A)  Final   Report Status 08/11/2021 FINAL  Final   Organism ID, Bacteria ESCHERICHIA COLI (A)  Final      Susceptibility   Escherichia coli - MIC*    AMPICILLIN 8 SENSITIVE Sensitive     CEFAZOLIN <=4 SENSITIVE Sensitive     CEFEPIME <=0.12 SENSITIVE Sensitive     CEFTRIAXONE <=0.25 SENSITIVE Sensitive     CIPROFLOXACIN <=0.25 SENSITIVE Sensitive     GENTAMICIN <=1 SENSITIVE Sensitive     IMIPENEM <=0.25 SENSITIVE Sensitive     NITROFURANTOIN <=16 SENSITIVE Sensitive     TRIMETH/SULFA <=20 SENSITIVE Sensitive     AMPICILLIN/SULBACTAM <=2 SENSITIVE Sensitive     PIP/TAZO <=4 SENSITIVE Sensitive     * >=100,000 COLONIES/mL ESCHERICHIA COLI  Resp Panel by RT-PCR (Flu A&B, Covid) Nasopharyngeal Swab     Status: None   Collection Time: 08/09/21 10:53 AM   Specimen: Nasopharyngeal Swab; Nasopharyngeal(NP) swabs in vial transport medium  Result Value Ref Range Status   SARS Coronavirus 2 by RT PCR NEGATIVE NEGATIVE Final    Comment: (NOTE) SARS-CoV-2 target nucleic acids are NOT DETECTED.  The SARS-CoV-2 RNA is generally detectable in upper respiratory specimens during the acute phase of infection. The lowest concentration of SARS-CoV-2 viral copies this assay can detect is 138 copies/mL. A negative result does not preclude SARS-Cov-2 infection and should not be used as the sole basis for treatment or other patient management decisions. A negative result may occur with  improper specimen collection/handling, submission of specimen other than nasopharyngeal swab, presence of viral mutation(s) within the areas targeted by this assay, and inadequate number of viral copies(<138 copies/mL). A negative result must be combined with clinical  observations, patient history, and epidemiological information. The expected result is Negative.  Fact Sheet for Patients:  EntrepreneurPulse.com.au  Fact Sheet for Healthcare Providers:  IncredibleEmployment.be  This test is no t yet approved or cleared by the [place] FDA and  has been authorized for detection and/or diagnosis of SARS-CoV-2 by FDA under an Emergency Use Authorization (EUA). This EUA will remain  in effect (meaning this test can be used) for the duration of the COVID-19 declaration under Section 564(b)(1) of the Act, 21 U.S.C.section 360bbb-3(b)(1), unless the authorization is terminated  or revoked sooner.       Influenza A by PCR NEGATIVE NEGATIVE  Final   Influenza B by PCR NEGATIVE NEGATIVE Final    Comment: (NOTE) The Xpert Xpress SARS-CoV-2/FLU/RSV plus assay is intended as an aid in the diagnosis of influenza from Nasopharyngeal swab specimens and should not be used as a sole basis for treatment. Nasal washings and aspirates are unacceptable for Xpert Xpress SARS-CoV-2/FLU/RSV testing.  Fact Sheet for Patients: EntrepreneurPulse.com.au  Fact Sheet for Healthcare Providers: IncredibleEmployment.be  This test is not yet approved or cleared by the [place] FDA and has been authorized for detection and/or diagnosis of SARS-CoV-2 by FDA under an Emergency Use Authorization (EUA). This EUA will remain in effect (meaning this test can be used) for the duration of the COVID-19 declaration under Section 564(b)(1) of the Act, 21 U.S.C. section 360bbb-3(b)(1), unless the authorization is terminated or revoked.  Performed at [facility], [place] [address], [place], [place] [address]           Radiology Studies: DG Abd 1 View  Result Date: 08/11/2021 CLINICAL DATA:  Nausea and vomiting EXAM: ABDOMEN - 1 VIEW COMPARISON:  None. FINDINGS: The stomach is  significantly distended with air. Left ureteral stent is noted in satisfactory position. Bilateral venous stents are noted and stable. No free air is seen. Degenerative changes of lumbar spine are noted. IMPRESSION: Significant gaseous distension of the stomach. No other acute abnormality is seen. Electronically Signed   By: Inez Catalina M.D.   On: 08/11/2021 03:02   US RENAL  Result Date: 08/10/2021 CLINICAL DATA:  UTI. EXAM: RENAL / URINARY TRACT ULTRASOUND COMPLETE COMPARISON:  None. FINDINGS: Right Kidney: Renal measurements: 11.3 cm x 6.4 cm x 6.5 cm = volume: 245.8 mL. Echogenicity within normal limits. No mass or hydronephrosis visualized. Left Kidney: Renal measurements: 10.8 cm x 5.0 cm x 5.5 cm = volume: 154.3 mL. Echogenicity within normal limits. No mass is visualized. Mild left-sided hydronephrosis is seen. Bladder: The urinary bladder is empty. Other: None. IMPRESSION: Mild left-sided hydronephrosis. Electronically Signed   By: Virgina Norfolk M.D.   On: 08/10/2021 19:27   VAS [place] LOWER EXTREMITY VENOUS (DVT)  Result Date: 08/11/2021  Lower Venous DVT Study Patient Name:  Patrick Bishop  Date of Exam:   08/11/2021 Medical Rec #: [id number]        Accession #:    [id number] Date of Birth: 1941/03/08        Patient Gender: M Patient Age:   46 years Exam Location:  [facility] Procedure:      VAS [place] LOWER EXTREMITY VENOUS (DVT) Referring Phys: [name] --------------------------------------------------------------------------------  Indications: Edema.  Risk Factors: None identified. Limitations: Body habitus, poor ultrasound/tissue interface, bandages and Unna boots. Comparison Study: No prior studies. Performing Technologist: Oliver Hum RVT  Examination Guidelines: A complete evaluation includes B-mode imaging, spectral Doppler, color Doppler, and power Doppler as needed of all accessible portions of each vessel. Bilateral testing is considered an integral part of a complete  examination. Limited examinations for reoccurring indications may be performed as noted. The reflux portion of the exam is performed with the patient in reverse Trendelenburg.  +---------+---------------+---------+-----------+----------+-------------------+ RIGHT    CompressibilityPhasicitySpontaneityPropertiesThrombus Aging      +---------+---------------+---------+-----------+----------+-------------------+ CFV      Full           Yes      Yes                                      +---------+---------------+---------+-----------+----------+-------------------+  SFJ      Full                                                             +---------+---------------+---------+-----------+----------+-------------------+ FV Prox  Full                                                             +---------+---------------+---------+-----------+----------+-------------------+ FV Mid   Full                                                             +---------+---------------+---------+-----------+----------+-------------------+ FV DistalFull                                                             +---------+---------------+---------+-----------+----------+-------------------+ PFV      Full                                                             +---------+---------------+---------+-----------+----------+-------------------+ POP      Full           Yes      Yes                                      +---------+---------------+---------+-----------+----------+-------------------+ PTV                                                   Not well visualized +---------+---------------+---------+-----------+----------+-------------------+ PERO                                                  Not well visualized +---------+---------------+---------+-----------+----------+-------------------+    +---------+---------------+---------+-----------+----------+-------------------+ LEFT     CompressibilityPhasicitySpontaneityPropertiesThrombus Aging      +---------+---------------+---------+-----------+----------+-------------------+ CFV      Full           Yes      Yes                                      +---------+---------------+---------+-----------+----------+-------------------+ SFJ      Full                                                             +---------+---------------+---------+-----------+----------+-------------------+  FV Prox  Full                                                             +---------+---------------+---------+-----------+----------+-------------------+ FV Mid   Full                                                             +---------+---------------+---------+-----------+----------+-------------------+ FV DistalFull                                                             +---------+---------------+---------+-----------+----------+-------------------+ PFV      Full                                                             +---------+---------------+---------+-----------+----------+-------------------+ POP      Full           Yes      Yes                                      +---------+---------------+---------+-----------+----------+-------------------+ PTV                                                   Not well visualized +---------+---------------+---------+-----------+----------+-------------------+ PERO                                                  Not well visualized +---------+---------------+---------+-----------+----------+-------------------+     Summary: RIGHT: - There is no evidence of deep vein thrombosis in the lower extremity. However, portions of this examination were limited- see technologist comments above.  - No cystic structure found in the popliteal fossa.  LEFT: - There is  no evidence of deep vein thrombosis in the lower extremity. However, portions of this examination were limited- see technologist comments above.  - No cystic structure found in the popliteal fossa.  *See table(s) above for measurements and observations. Electronically signed by Harold Barban MD on 08/11/2021 at 8:54:28 PM.    Final         Scheduled Meds:  atorvastatin  10 mg Oral Daily   [START ON 08/13/2021] cefadroxil  500 mg Oral BID   docusate sodium  100 mg Oral BID   furosemide  80 mg Intravenous BID   Gerhardt's butt cream   Topical TID   metoprolol tartrate  25 mg Oral Daily   metoprolol tartrate  37.5 mg Oral QHS   pantoprazole (PROTONIX) IV  40 mg Intravenous Q12H   potassium chloride  40 mEq Oral BID   tamsulosin  0.4 mg Oral Daily   Continuous Infusions:     LOS: 3 days        [name], MD [facility]   To contact the attending provider between 7A-7P or the covering provider during after hours 7P-7A, please log into the web site [email or web address] and access using universal [place] password for that web site. If you do not have the password, please call the hospital operator.  08/12/2021, 3:28 PM

## 2021-08-12 NOTE — Progress Notes (Signed)
[facility] Gastroenterology Progress Note  Patrick Bishop 80 y.o. 02-01-1941  CC:  Hematemesis   Subjective: Patient states he is doing well today. Ate pancakes this morning without difficulty. Has had no more episodes of vomiting. Denies nausea. Reports increased eructation. Denies abdominal pain.  ROS : Review of Systems  Cardiovascular:  Negative for chest pain and palpitations.  Gastrointestinal:  Negative for abdominal pain, blood in stool, constipation, diarrhea, heartburn, melena, nausea and vomiting.     Objective: Vital signs in last 24 hours: Vitals:   08/11/21 2108 08/12/21 0513  BP: 112/61 126/67  Pulse: 68 87  Resp: 19 18  Temp: 97.6 F (36.4 C) 97.7 F (36.5 C)  SpO2: 96% 94%    Physical Exam:  General:  Alert, cooperative, no distress, appears stated age  Head:  Normocephalic, without obvious abnormality, atraumatic  Eyes:  Anicteric sclera, EOM's intact  Lungs:   Clear to auscultation bilaterally, respirations unlabored  Heart:  Regular rate and rhythm, S1, S2 normal  Abdomen:   Soft, non-tender, bowel sounds active all four quadrants,  no masses,     Lab Results: Recent Labs    08/09/21 0924 08/10/21 0351 08/11/21 0058 08/11/21 1404 08/12/21 0339  NA 137   < > 141  --  145  K 3.2*   < > 2.9* 2.9* 3.3*  CL 100   < > 102  --  102  CO2 26   < > 28  --  34*  GLUCOSE 158*   < > 174*  --  163*  BUN 36*   < > 37*  --  47*  CREATININE 2.17*   < > 1.89*  --  1.76*  CALCIUM 8.5*   < > 8.1*  --  8.6*  MG 2.2  --  2.1  --   --    < > = values in this interval not displayed.   Recent Labs    08/09/21 0924 08/10/21 0351  AST 23 19  ALT 14 14  ALKPHOS 72 60  BILITOT 1.6* 1.0  PROT 7.6 6.4*  ALBUMIN 3.4* 2.6*   Recent Labs    08/11/21 0058 08/12/21 0339  WBC 7.1 7.3  HGB 9.5* 10.6*  HCT 29.5* 33.7*  MCV 93.4 93.1  PLT 206 276   No results for input(s): LABPROT, INR in the last 72 hours.    Assessment Hematemesis - EGD 11/16: LA Grade D  esophagitis with no bleeding, likely source of hematemesis, gastritis, normal duodenal bulb. No specimens collected. - HGB 10.6 (improved from 9.5 yesterday)  CHF  AFIB  HTN  Bladder cancer  Plan: No more vomiting today, overall improvement. HGB improvement as well. Can continue protonix BID. Continue supportive care and monitor hgb. [facility] will sign off. Please contact us if we can be of any further assistance during this hospital stay.   Liborio Saccente Radford Pax PA-C 08/12/2021, 8:22 AM  Contact #  [phone]

## 2021-08-12 NOTE — TOC Progression Note (Addendum)
Transition of Care [facility]) - Progression Note    Patient Details  Name: Patrick Bishop MRN: [id number] Date of Birth: 13-Mar-1941  [facility] CM/SW Contact  Purcell Mouton, RN Phone Number: 08/12/2021, 2:45 PM  Clinical Narrative:    Pt is from [facility]. His Significant other [name] want him to return to [place]. PT and CM explained to her that pt can not stand, he will need more PT before going back to [facility]. Spoke with [facility] rep who also states that pt will need SNF/Rehab before returning to [facility]. Spoke with pt who agreed with ST/Rehab. Will fax FL2 out.   Expected Discharge Plan: [place] Barriers to Discharge: No Barriers Identified  Expected Discharge Plan and Services Expected Discharge Plan: [place] Choice: [place] arrangements for the past 2 months: [place]                                       Social Determinants of Health (SDOH) Interventions    Readmission Risk Interventions No flowsheet data found.

## 2021-08-12 NOTE — Care Management Important Message (Signed)
Important Message  Patient Details IM Letter given to the Patient. Name: Patrick Bishop MRN: [id number] Date of Birth: December 28, 1940   Medicare Important Message Given:  Yes     Kerin Salen 08/12/2021, 10:56 AM

## 2021-08-12 NOTE — NC FL2 (Signed)
[place] LEVEL OF CARE SCREENING TOOL     IDENTIFICATION  Patient Name: Patrick Bishop Birthdate: 06/12/1941 Sex: male Admission Date (Current Location): 08/09/2021  [facility] and [place] Number:  Herbalist and Address:  [facility],  [place], [place]      Provider Number: [id number]  Attending Physician Name and Address:  [name], MD  Relative Name and Phone Number:  Aldean Ast Significant other [phone]  [phone], Neena Rhymes Daughter [phone]    Current Level of Care: Hospital Recommended Level of Care: [place] Prior Approval Number:    Date Approved/Denied:   PASRR Number: [id number]  Discharge Plan: SNF    Current Diagnoses: Patient Active Problem List   Diagnosis Date Noted   Pressure injury of skin 08/10/2021   Generalized weakness 08/09/2021   Cancer of trigone of urinary bladder ([place]) 11/05/2020   Pre-op evaluation 09/28/2020   Goals of care, counseling/discussion 08/27/2019   Pneumonia due to COVID-19 virus 08/05/2019   Pneumonia 08/03/2019   Suspected COVID-19 virus infection    Shortness of breath 08/02/2019   Leukopenia    Heme + stool    Decreased hemoglobin    Hydronephrosis with obstructing calculus 06/24/2019   Cholelithiasis 06/24/2019   Arthritis 07/12/2018   Chronic pain 07/12/2018   Hearing loss 07/12/2018   Lymphedema 07/12/2018   Muscle weakness 07/12/2018   Unsteady gait 07/12/2018   Morbid obesity ([place]) 07/08/2018   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 -  11/14/2017   Chronic diastolic HF (heart failure) ([place])    Chronic venous insufficiency    Essential hypertension    Permanent atrial fibrillation (HCC); CHA2DS2-VASc score =5 (agex2, aortic plaque, CHF, HTN)    OSA on CPAP    Pulmonary hypertension ([place])    Hyperlipidemia with target LDL less than 100     Orientation RESPIRATION BLADDER Height & Weight      Self, Time, Situation, Place  O2 Incontinent, External catheter Weight: 133 kg Height:  6\' 1"  (185.4 cm)  BEHAVIORAL SYMPTOMS/MOOD NEUROLOGICAL BOWEL NUTRITION STATUS      Incontinent Diet  AMBULATORY STATUS COMMUNICATION OF NEEDS Skin   Extensive Assist Verbally PU Stage and Appropriate Care (Pressure Injury Stage 2 Buttocks left, Bilateral legs Blister, Ecchymosis, Billateral arms Ecchymosis. Left heel deep tissue injury.)   PU Stage 2 Dressing: Daily (legs dressings daily)                   Personal Care Assistance Level of Assistance  Bathing, Feeding, Dressing Bathing Assistance: Maximum assistance Feeding assistance: Limited assistance Dressing Assistance: Maximum assistance     Functional Limitations Info  Sight, Hearing, Speech Sight Info: Adequate Hearing Info: Impaired Speech Info: Adequate    SPECIAL CARE FACTORS FREQUENCY  PT (By licensed PT), OT (By licensed OT)     PT Frequency: x5 week OT Frequency: x5 week            Contractures Contractures Info: Not present    Additional Factors Info  Code Status, Allergies Code Status Info: FULL Allergies Info: Aldactone (Spironolactone)           Current Medications (08/12/2021):  This is the current hospital active medication list Current Facility-Administered Medications  Medication Dose Route Frequency Provider Last Rate Last Admin   acetaminophen (TYLENOL) tablet 650 mg  650 mg Oral Q6H PRN Arta Silence, MD   650 mg at 08/10/21 0425   Or   acetaminophen (TYLENOL) suppository 650  mg  650 mg Rectal Q6H PRN Arta Silence, MD       atorvastatin (LIPITOR) tablet 10 mg  10 mg Oral Daily Arta Silence, MD   10 mg at 08/12/21 0906   [START ON 08/13/2021] cefadroxil (DURICEF) capsule 500 mg  500 mg Oral BID [name], MD       docusate sodium (COLACE) capsule 100 mg  100 mg Oral BID Arta Silence, MD   100 mg at 08/12/21 5953   furosemide (LASIX) injection 80 mg  80 mg Intravenous BID Arta Silence, MD   80 mg at 08/12/21 9672   Gerhardt's butt cream   Topical TID Arta Silence, MD   Given at 08/12/21 1000   metoprolol tartrate (LOPRESSOR) tablet 25 mg  25 mg Oral Daily Arta Silence, MD   25 mg at 08/12/21 0906   metoprolol tartrate (LOPRESSOR) tablet 37.5 mg  37.5 mg Oral Michell Heinrich, MD   37.5 mg at 08/11/21 2221   ondansetron (ZOFRAN) injection 4 mg  4 mg Intravenous Q6H PRN Arta Silence, MD   4 mg at 08/11/21 0625   pantoprazole (PROTONIX) injection 40 mg  40 mg Intravenous Gar Ponto, MD   40 mg at 08/12/21 0907   potassium chloride SA (KLOR-CON) CR tablet 40 mEq  40 mEq Oral BID [name], MD   40 mEq at 08/12/21 8979   prochlorperazine (COMPAZINE) tablet 10 mg  10 mg Oral Q6H PRN Arta Silence, MD   10 mg at 08/12/21 0906   tamsulosin (FLOMAX) capsule 0.4 mg  0.4 mg Oral Daily Arta Silence, MD   0.4 mg at 08/12/21 1504     Discharge Medications: Please see discharge summary for a list of discharge medications.  Relevant Imaging Results:  Relevant Lab Results:   Additional Information SS#[id number]  Purcell Mouton, RN

## 2021-08-12 NOTE — Progress Notes (Signed)
1 Day Post-Op Subjective: No GU complaints.  AFVSS over the past 24 hours.  Renal function improving. RUS shows chronic left hydronephrosis.  Left ureteral stent in good position on KUB.   Objective: Vital signs in last 24 hours: Temp:  [97.6 F (36.4 C)-97.7 F (36.5 C)] 97.7 F (36.5 C) (11/17 0513) Pulse Rate:  [68-87] 87 (11/17 0513) Resp:  [18-19] 18 (11/17 0513) BP: (112-126)/(61-67) 126/67 (11/17 0513) SpO2:  [94 %-96 %] 94 % (11/17 0513) Weight:  [960 kg] 133 kg (11/17 0500)  Intake/Output from previous day: 11/16 0701 - 11/17 0700 In: 100 [I.V.:100] Out: 2050 [Urine:2050]  Intake/Output this shift: No intake/output data recorded.  Physical Exam:  General: Alert and oriented   Lab Results: Recent Labs    08/10/21 1853 08/11/21 0058 08/12/21 0339  HGB 9.4* 9.5* 10.6*  HCT 29.4* 29.5* 33.7*   BMET Recent Labs    08/11/21 0058 08/11/21 1404 08/12/21 0339  NA 141  --  145  K 2.9* 2.9* 3.3*  CL 102  --  102  CO2 28  --  34*  GLUCOSE 174*  --  163*  BUN 37*  --  47*  CREATININE 1.89*  --  1.76*  CALCIUM 8.1*  --  8.6*     Studies/Results: DG Abd 1 View  Result Date: 08/11/2021 CLINICAL DATA:  Nausea and vomiting EXAM: ABDOMEN - 1 VIEW COMPARISON:  None. FINDINGS: The stomach is significantly distended with air. Left ureteral stent is noted in satisfactory position. Bilateral venous stents are noted and stable. No free air is seen. Degenerative changes of lumbar spine are noted. IMPRESSION: Significant gaseous distension of the stomach. No other acute abnormality is seen. Electronically Signed   By: Inez Catalina M.D.   On: 08/11/2021 03:02   US RENAL  Result Date: 08/10/2021 CLINICAL DATA:  UTI. EXAM: RENAL / URINARY TRACT ULTRASOUND COMPLETE COMPARISON:  None. FINDINGS: Right Kidney: Renal measurements: 11.3 cm x 6.4 cm x 6.5 cm = volume: 245.8 mL. Echogenicity within normal limits. No mass or hydronephrosis visualized. Left Kidney: Renal measurements:  10.8 cm x 5.0 cm x 5.5 cm = volume: 154.3 mL. Echogenicity within normal limits. No mass is visualized. Mild left-sided hydronephrosis is seen. Bladder: The urinary bladder is empty. Other: None. IMPRESSION: Mild left-sided hydronephrosis. Electronically Signed   By: Virgina Norfolk M.D.   On: 08/10/2021 19:27   ECHOCARDIOGRAM COMPLETE  Result Date: 08/10/2021    ECHOCARDIOGRAM REPORT   Patient Name:   Patrick Bishop Date of Exam: 08/10/2021 Medical Rec #:  [id number]       Height:       73.0 in Accession #:    [id number]      Weight:       304.2 lb Date of Birth:  [birth year]       BSA:          2.572 m Patient Age:    80 years        BP:           104/50 mmHg Patient Gender: M               HR:           74 bpm. Exam Location:  Inpatient Procedure: 2D Echo, Color Doppler, Cardiac Doppler and Intracardiac            Opacification Agent Indications:    CHF  History:        Patient has prior history of Echocardiogram  examinations. CHF,                 Pulmonary HTN; Risk Factors:Hypertension and Sleep Apnea.  Sonographer:    Jyl Heinz Referring Phys: [id number] [place]  1. Left ventricular ejection fraction, by estimation, is 60 to 65%. The left ventricle has normal function. The left ventricle has no regional wall motion abnormalities. There is mild concentric left ventricular hypertrophy. Left ventricular diastolic parameters are indeterminate. There is the interventricular septum is flattened in systole and diastole, consistent with right ventricular pressure and volume overload.  2. Right ventricular systolic function is low normal. The right ventricular size is severely enlarged.  3. Left atrial size was moderately dilated.  4. Right atrial size was severely dilated.  5. The mitral valve is normal in structure. No evidence of mitral valve regurgitation. No evidence of mitral stenosis.  6. Tricuspid valve regurgitation is moderate.  7. The aortic valve is tricuspid. Aortic valve  regurgitation is not visualized. No aortic stenosis is present.  8. There is color/spectral Doppler discrepancy in pulmonic regurgitation signal, study may underestimated the level of regurgitation. FINDINGS  Left Ventricle: Left ventricular ejection fraction, by estimation, is 60 to 65%. The left ventricle has normal function. The left ventricle has no regional wall motion abnormalities. The left ventricular internal cavity size was normal in size. There is  mild concentric left ventricular hypertrophy. The interventricular septum is flattened in systole and diastole, consistent with right ventricular pressure and volume overload. Left ventricular diastolic parameters are indeterminate. Right Ventricle: The right ventricular size is severely enlarged. No increase in right ventricular wall thickness. Right ventricular systolic function is low normal. Left Atrium: Left atrial size was moderately dilated. Right Atrium: Right atrial size was severely dilated. Pericardium: There is no evidence of pericardial effusion. Mitral Valve: The mitral valve is normal in structure. No evidence of mitral valve regurgitation. No evidence of mitral valve stenosis. Tricuspid Valve: The tricuspid valve is normal in structure. Tricuspid valve regurgitation is moderate . No evidence of tricuspid stenosis. Aortic Valve: The aortic valve is tricuspid. Aortic valve regurgitation is not visualized. No aortic stenosis is present. Aortic valve peak gradient measures 8.4 mmHg. Pulmonic Valve: The pulmonic valve was not well visualized. Pulmonic valve regurgitation is mild. No evidence of pulmonic stenosis. Aorta: The aortic root and ascending aorta are structurally normal, with no evidence of dilitation. Pulmonary Artery: There is color/spectral Doppler discrepancy in pulmonic regurgitation signal, study may underestimated the level of regurgitation. IAS/Shunts: The atrial septum is grossly normal.  LEFT VENTRICLE PLAX 2D LVIDd:         5.90  cm      Diastology LVIDs:         3.80 cm      LV e' medial:    8.92 cm/s LV PW:         1.30 cm      LV E/e' medial:  11.1 LV IVS:        1.20 cm      LV e' lateral:   11.20 cm/s LVOT diam:     2.00 cm      LV E/e' lateral: 8.8 LV SV:         64 LV SV Index:   25 LVOT Area:     3.14 cm  LV Volumes (MOD) LV vol d, MOD A2C: 142.0 ml LV vol d, MOD A4C: 131.0 ml LV vol s, MOD A2C: 54.6 ml LV vol s, MOD A4C:  50.4 ml LV SV MOD A2C:     87.4 ml LV SV MOD A4C:     131.0 ml LV SV MOD BP:      86.7 ml RIGHT VENTRICLE             IVC RV Basal diam:  5.50 cm     IVC diam: 2.10 cm RV Mid diam:    4.10 cm RV S prime:     11.70 cm/s TAPSE (M-mode): 2.0 cm LEFT ATRIUM              Index        RIGHT ATRIUM           Index LA diam:        4.70 cm  1.83 cm/m   RA Area:     35.20 cm LA Vol (A2C):   96.8 ml  37.64 ml/m  RA Volume:   137.00 ml 53.28 ml/m LA Vol (A4C):   108.0 ml 42.00 ml/m LA Biplane Vol: 104.0 ml 40.44 ml/m  AORTIC VALVE AV Area (Vmax): 2.53 cm AV Vmax:        145.00 cm/s AV Peak Grad:   8.4 mmHg LVOT Vmax:      117.00 cm/s LVOT Vmean:     85.900 cm/s LVOT VTI:       0.204 m  AORTA Ao Root diam: 3.50 cm Ao Asc diam:  3.30 cm MITRAL VALVE               TRICUSPID VALVE MV Area (PHT): 3.66 cm    TR Peak grad:   41.0 mmHg MV Decel Time: 207 msec    TR Vmax:        320.00 cm/s MV E velocity: 99.00 cm/s                            SHUNTS                            Systemic VTI:  0.20 m                            Systemic Diam: 2.00 cm Rudean Haskell MD Electronically signed by Rudean Haskell MD Signature Date/Time: 08/10/2021/3:36:58 PM    Final    VAS [place] LOWER EXTREMITY VENOUS (DVT)  Result Date: 08/11/2021  Lower Venous DVT Study Patient Name:  Patrick Bishop  Date of Exam:   08/11/2021 Medical Rec #: [id number]        Accession #:    [id number] Date of Birth: 08-08-1941        Patient Gender: M Patient Age:   1 years Exam Location:  [facility] Procedure:      VAS [place] LOWER EXTREMITY  VENOUS (DVT) Referring Phys: [name] --------------------------------------------------------------------------------  Indications: Edema.  Risk Factors: None identified. Limitations: Body habitus, poor ultrasound/tissue interface, bandages and Unna boots. Comparison Study: No prior studies. Performing Technologist: Oliver Hum RVT  Examination Guidelines: A complete evaluation includes B-mode imaging, spectral Doppler, color Doppler, and power Doppler as needed of all accessible portions of each vessel. Bilateral testing is considered an integral part of a complete examination. Limited examinations for reoccurring indications may be performed as noted. The reflux portion of the exam is performed with the patient in reverse Trendelenburg.  +---------+---------------+---------+-----------+----------+-------------------+ RIGHT    CompressibilityPhasicitySpontaneityPropertiesThrombus Aging      +---------+---------------+---------+-----------+----------+-------------------+  CFV      Full           Yes      Yes                                      +---------+---------------+---------+-----------+----------+-------------------+ SFJ      Full                                                             +---------+---------------+---------+-----------+----------+-------------------+ FV Prox  Full                                                             +---------+---------------+---------+-----------+----------+-------------------+ FV Mid   Full                                                             +---------+---------------+---------+-----------+----------+-------------------+ FV DistalFull                                                             +---------+---------------+---------+-----------+----------+-------------------+ PFV      Full                                                              +---------+---------------+---------+-----------+----------+-------------------+ POP      Full           Yes      Yes                                      +---------+---------------+---------+-----------+----------+-------------------+ PTV                                                   Not well visualized +---------+---------------+---------+-----------+----------+-------------------+ PERO                                                  Not well visualized +---------+---------------+---------+-----------+----------+-------------------+   +---------+---------------+---------+-----------+----------+-------------------+ LEFT     CompressibilityPhasicitySpontaneityPropertiesThrombus Aging      +---------+---------------+---------+-----------+----------+-------------------+ CFV      Full           Yes  Yes                                      +---------+---------------+---------+-----------+----------+-------------------+ SFJ      Full                                                             +---------+---------------+---------+-----------+----------+-------------------+ FV Prox  Full                                                             +---------+---------------+---------+-----------+----------+-------------------+ FV Mid   Full                                                             +---------+---------------+---------+-----------+----------+-------------------+ FV DistalFull                                                             +---------+---------------+---------+-----------+----------+-------------------+ PFV      Full                                                             +---------+---------------+---------+-----------+----------+-------------------+ POP      Full           Yes      Yes                                      +---------+---------------+---------+-----------+----------+-------------------+  PTV                                                   Not well visualized +---------+---------------+---------+-----------+----------+-------------------+ PERO                                                  Not well visualized +---------+---------------+---------+-----------+----------+-------------------+     Summary: RIGHT: - There is no evidence of deep vein thrombosis in the lower extremity. However, portions of this examination were limited- see technologist comments above.  - No cystic structure found in the popliteal fossa.  LEFT: - There is no evidence of deep vein thrombosis in the lower extremity. However, portions of this  examination were limited- see technologist comments above.  - No cystic structure found in the popliteal fossa.  *See table(s) above for measurements and observations. Electronically signed by Harold Barban MD on 08/11/2021 at 8:54:28 PM.    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  Currently being treated for pansensitive E. Coli UTI, upper GI bleed and CHF exacerbation.  -Recommend culture specific abx for a total of 10 days.  Will plan for left ureteral stent exchange in the coming weeks as an outpatient.   LOS: 3 days   Ellison Hughs, MD [facility] Pager: [phone]  08/12/2021, 2:23 PM

## 2021-08-12 NOTE — Progress Notes (Signed)
Physical Therapy Treatment Patient Details Name: Patrick Bishop MRN: [id number] DOB: May 31, 1941 Today's Date: 08/12/2021   History of Present Illness Patrick Bishop is a 80 y.o. male with medical history significant of a fib on xarelto, diastolic HF, HTN, bladder cancer.  Presenting with BLE weakness and increased swelling,He was noted to be volume overloaded. Unable to  bear weight  and ambulate for the last few days.    PT Comments    The patient  is  very bright and cheerful this day. Patient demonstrates improved   ability to move  legs,R>L. The patient is noted with decreased edema of legs, also.  The patient able to assist with bed mobility, rolling, moving legs to bed edge, max assist to sit up. Able to sit on bed side x 10 ". Attempted standing x 3, unable to power up. LLE significantly weaker than right.  Patient will benefit from post acute rehab at [facility].   Recommendations for follow up therapy are one component of a multi-disciplinary discharge planning process, led by the attending physician.  Recommendations may be updated based on patient status, additional functional criteria and insurance authorization.  Follow Up Recommendations  Skilled nursing-short term rehab (<3 hours/day)     Assistance Recommended at Discharge Frequent or constant Supervision/Assistance  Equipment Recommendations  None recommended by PT    Recommendations for Other Services       Precautions / Restrictions Precautions Precautions: Fall Precaution Comments: cellulitias both legs, incontinence, lasix     Mobility  Bed Mobility   Bed Mobility: Rolling Rolling: Mod assist         General bed mobility comments: patient able to reach for rails and roll to each side, requires assistance with legs . Mod assist to move left leg to bed edge, able to slowly move the right leg, Patient used rail to slide trunk  to left, able to roll to right and reach for rail. Max assistance to pull up to  sitting. Total assistance to return to supine, assist with legs and trunk, patient able to reach back with UE's and assist with sliding up into bed.    Transfers Overall transfer level: Needs assistance Equipment used: Rolling walker (2 wheels) Transfers: Sit to/from Stand Sit to Stand: Total assist;+2 physical assistance;+2 safety/equipment           General transfer comment: attempted x 3 to stand at [facility], Patient unable to power up. will try STEDY next visit.    Ambulation/Gait                   Stairs             Wheelchair Mobility    Modified Rankin (Stroke Patients Only)       Balance Overall balance assessment: Needs assistance Sitting-balance support: Bilateral upper extremity supported;Feet supported Sitting balance-Leahy Scale: Fair Sitting balance - Comments: supporting withUE's on bed or RW. While sitting , patient able to use both UE's to  assist scooting up to bed head x 4 scoots with mod assistance                                    Cognition Arousal/Alertness: Awake/alert Behavior During Therapy: WFL for tasks assessed/performed Overall Cognitive Status: Within Functional Limits for tasks assessed  General Comments: very pleasant and likes to joke        Exercises General Exercises - Lower Extremity Ankle Circles/Pumps: AROM;Both;10 reps Long Arc Quad: AROM;AAROM;Both;10 reps;Seated    General Comments        Pertinent Vitals/Pain Faces Pain Scale: Hurts a little bit Pain Location: left leg when lifted, hips when rolled Pain Descriptors / Indicators: Aching;Discomfort;Moaning Pain Intervention(s): Monitored during session    Home Living                          Prior Function            PT Goals (current goals can now be found in the care plan section) Progress towards PT goals: Progressing toward goals    Frequency    Min 2X/week      PT  Plan Current plan remains appropriate    Co-evaluation              AM-PAC PT "6 Clicks" Mobility   Outcome Measure  Help needed turning from your back to your side while in a flat bed without using bedrails?: A Lot Help needed moving from lying on your back to sitting on the side of a flat bed without using bedrails?: Total Help needed moving to and from a bed to a chair (including a wheelchair)?: Total Help needed standing up from a chair using your arms (e.g., wheelchair or bedside chair)?: Total Help needed to walk in hospital room?: Total Help needed climbing 3-5 steps with a railing? : Total 6 Click Score: 7    End of Session Equipment Utilized During Treatment: Gait belt Activity Tolerance: Patient tolerated treatment well Patient left: in bed;with call bell/phone within reach;with bed alarm set;with family/visitor present Nurse Communication: Mobility status;Need for lift equipment PT Visit Diagnosis: Muscle weakness (generalized) (M62.81);Difficulty in walking, not elsewhere classified (R26.2)     Time: 4076-8088 PT Time Calculation (min) (ACUTE ONLY): 32 min  Charges:  $Therapeutic Activity: 23-37 mins                     Tresa Endo PT Acute Rehabilitation Services Pager [phone] Office [phone]    Claretha Cooper 08/12/2021, 2:37 PM

## 2021-08-13 DIAGNOSIS — I10 Essential (primary) hypertension: Secondary | ICD-10-CM | POA: Diagnosis not present

## 2021-08-13 DIAGNOSIS — C670 Malignant neoplasm of trigone of bladder: Secondary | ICD-10-CM | POA: Diagnosis not present

## 2021-08-13 DIAGNOSIS — I872 Venous insufficiency (chronic) (peripheral): Secondary | ICD-10-CM | POA: Diagnosis not present

## 2021-08-13 DIAGNOSIS — R5381 Other malaise: Secondary | ICD-10-CM | POA: Diagnosis not present

## 2021-08-13 DIAGNOSIS — E872 Acidosis, unspecified: Secondary | ICD-10-CM | POA: Diagnosis not present

## 2021-08-13 DIAGNOSIS — I890 Lymphedema, not elsewhere classified: Secondary | ICD-10-CM | POA: Diagnosis not present

## 2021-08-13 DIAGNOSIS — Z01818 Encounter for other preprocedural examination: Secondary | ICD-10-CM | POA: Diagnosis not present

## 2021-08-13 DIAGNOSIS — Z466 Encounter for fitting and adjustment of urinary device: Secondary | ICD-10-CM | POA: Diagnosis not present

## 2021-08-13 DIAGNOSIS — L89622 Pressure ulcer of left heel, stage 2: Secondary | ICD-10-CM | POA: Diagnosis present

## 2021-08-13 DIAGNOSIS — I4821 Permanent atrial fibrillation: Secondary | ICD-10-CM | POA: Diagnosis not present

## 2021-08-13 DIAGNOSIS — G4733 Obstructive sleep apnea (adult) (pediatric): Secondary | ICD-10-CM | POA: Diagnosis present

## 2021-08-13 DIAGNOSIS — R32 Unspecified urinary incontinence: Secondary | ICD-10-CM | POA: Diagnosis present

## 2021-08-13 DIAGNOSIS — M1990 Unspecified osteoarthritis, unspecified site: Secondary | ICD-10-CM | POA: Diagnosis not present

## 2021-08-13 DIAGNOSIS — Z23 Encounter for immunization: Secondary | ICD-10-CM | POA: Diagnosis not present

## 2021-08-13 DIAGNOSIS — N132 Hydronephrosis with renal and ureteral calculous obstruction: Secondary | ICD-10-CM | POA: Diagnosis not present

## 2021-08-13 DIAGNOSIS — R6520 Severe sepsis without septic shock: Secondary | ICD-10-CM | POA: Diagnosis not present

## 2021-08-13 DIAGNOSIS — R000 Tachycardia, unspecified: Secondary | ICD-10-CM | POA: Diagnosis not present

## 2021-08-13 DIAGNOSIS — N400 Enlarged prostate without lower urinary tract symptoms: Secondary | ICD-10-CM | POA: Diagnosis present

## 2021-08-13 DIAGNOSIS — R279 Unspecified lack of coordination: Secondary | ICD-10-CM | POA: Diagnosis not present

## 2021-08-13 DIAGNOSIS — N178 Other acute kidney failure: Secondary | ICD-10-CM | POA: Diagnosis not present

## 2021-08-13 DIAGNOSIS — D638 Anemia in other chronic diseases classified elsewhere: Secondary | ICD-10-CM | POA: Diagnosis present

## 2021-08-13 DIAGNOSIS — E861 Hypovolemia: Secondary | ICD-10-CM | POA: Diagnosis present

## 2021-08-13 DIAGNOSIS — R159 Full incontinence of feces: Secondary | ICD-10-CM | POA: Diagnosis present

## 2021-08-13 DIAGNOSIS — R6510 Systemic inflammatory response syndrome (SIRS) of non-infectious origin without acute organ dysfunction: Secondary | ICD-10-CM | POA: Diagnosis present

## 2021-08-13 DIAGNOSIS — J9811 Atelectasis: Secondary | ICD-10-CM | POA: Diagnosis not present

## 2021-08-13 DIAGNOSIS — Z743 Need for continuous supervision: Secondary | ICD-10-CM | POA: Diagnosis not present

## 2021-08-13 DIAGNOSIS — R2689 Other abnormalities of gait and mobility: Secondary | ICD-10-CM | POA: Diagnosis not present

## 2021-08-13 DIAGNOSIS — N390 Urinary tract infection, site not specified: Secondary | ICD-10-CM | POA: Diagnosis not present

## 2021-08-13 DIAGNOSIS — I272 Pulmonary hypertension, unspecified: Secondary | ICD-10-CM | POA: Diagnosis present

## 2021-08-13 DIAGNOSIS — R531 Weakness: Secondary | ICD-10-CM | POA: Diagnosis not present

## 2021-08-13 DIAGNOSIS — R197 Diarrhea, unspecified: Secondary | ICD-10-CM | POA: Diagnosis not present

## 2021-08-13 DIAGNOSIS — R262 Difficulty in walking, not elsewhere classified: Secondary | ICD-10-CM | POA: Diagnosis not present

## 2021-08-13 DIAGNOSIS — Z20822 Contact with and (suspected) exposure to covid-19: Secondary | ICD-10-CM | POA: Diagnosis present

## 2021-08-13 DIAGNOSIS — M7989 Other specified soft tissue disorders: Secondary | ICD-10-CM | POA: Diagnosis not present

## 2021-08-13 DIAGNOSIS — G8929 Other chronic pain: Secondary | ICD-10-CM | POA: Diagnosis not present

## 2021-08-13 DIAGNOSIS — E785 Hyperlipidemia, unspecified: Secondary | ICD-10-CM | POA: Diagnosis not present

## 2021-08-13 DIAGNOSIS — I130 Hypertensive heart and chronic kidney disease with heart failure and stage 1 through stage 4 chronic kidney disease, or unspecified chronic kidney disease: Secondary | ICD-10-CM | POA: Diagnosis present

## 2021-08-13 DIAGNOSIS — N1832 Chronic kidney disease, stage 3b: Secondary | ICD-10-CM | POA: Diagnosis not present

## 2021-08-13 DIAGNOSIS — K8020 Calculus of gallbladder without cholecystitis without obstruction: Secondary | ICD-10-CM | POA: Diagnosis not present

## 2021-08-13 DIAGNOSIS — R001 Bradycardia, unspecified: Secondary | ICD-10-CM | POA: Diagnosis not present

## 2021-08-13 DIAGNOSIS — N1330 Unspecified hydronephrosis: Secondary | ICD-10-CM | POA: Diagnosis not present

## 2021-08-13 DIAGNOSIS — R41841 Cognitive communication deficit: Secondary | ICD-10-CM | POA: Diagnosis not present

## 2021-08-13 DIAGNOSIS — L89322 Pressure ulcer of left buttock, stage 2: Secondary | ICD-10-CM | POA: Diagnosis not present

## 2021-08-13 DIAGNOSIS — I5032 Chronic diastolic (congestive) heart failure: Secondary | ICD-10-CM | POA: Diagnosis not present

## 2021-08-13 DIAGNOSIS — J9611 Chronic respiratory failure with hypoxia: Secondary | ICD-10-CM | POA: Diagnosis present

## 2021-08-13 DIAGNOSIS — G894 Chronic pain syndrome: Secondary | ICD-10-CM | POA: Diagnosis not present

## 2021-08-13 DIAGNOSIS — I7143 Infrarenal abdominal aortic aneurysm, without rupture: Secondary | ICD-10-CM | POA: Diagnosis present

## 2021-08-13 DIAGNOSIS — I5042 Chronic combined systolic (congestive) and diastolic (congestive) heart failure: Secondary | ICD-10-CM | POA: Diagnosis not present

## 2021-08-13 DIAGNOSIS — N179 Acute kidney failure, unspecified: Secondary | ICD-10-CM | POA: Diagnosis not present

## 2021-08-13 DIAGNOSIS — I959 Hypotension, unspecified: Secondary | ICD-10-CM | POA: Diagnosis not present

## 2021-08-13 DIAGNOSIS — I4891 Unspecified atrial fibrillation: Secondary | ICD-10-CM | POA: Diagnosis not present

## 2021-08-13 DIAGNOSIS — M6281 Muscle weakness (generalized): Secondary | ICD-10-CM | POA: Diagnosis not present

## 2021-08-13 DIAGNOSIS — I5033 Acute on chronic diastolic (congestive) heart failure: Secondary | ICD-10-CM | POA: Diagnosis not present

## 2021-08-13 DIAGNOSIS — E860 Dehydration: Secondary | ICD-10-CM | POA: Diagnosis not present

## 2021-08-13 DIAGNOSIS — A419 Sepsis, unspecified organism: Secondary | ICD-10-CM | POA: Diagnosis not present

## 2021-08-13 DIAGNOSIS — R739 Hyperglycemia, unspecified: Secondary | ICD-10-CM | POA: Diagnosis not present

## 2021-08-13 DIAGNOSIS — I129 Hypertensive chronic kidney disease with stage 1 through stage 4 chronic kidney disease, or unspecified chronic kidney disease: Secondary | ICD-10-CM | POA: Diagnosis not present

## 2021-08-13 DIAGNOSIS — H9190 Unspecified hearing loss, unspecified ear: Secondary | ICD-10-CM | POA: Diagnosis not present

## 2021-08-13 DIAGNOSIS — D631 Anemia in chronic kidney disease: Secondary | ICD-10-CM | POA: Diagnosis not present

## 2021-08-13 DIAGNOSIS — K828 Other specified diseases of gallbladder: Secondary | ICD-10-CM | POA: Diagnosis not present

## 2021-08-13 DIAGNOSIS — L309 Dermatitis, unspecified: Secondary | ICD-10-CM | POA: Diagnosis not present

## 2021-08-13 DIAGNOSIS — R0902 Hypoxemia: Secondary | ICD-10-CM | POA: Diagnosis not present

## 2021-08-13 LAB — BASIC METABOLIC PANEL
Anion gap: 9 (ref 5–15)
BUN: 51 mg/dL — ABNORMAL HIGH (ref 8–23)
CO2: 34 mmol/L — ABNORMAL HIGH (ref 22–32)
Calcium: 8.4 mg/dL — ABNORMAL LOW (ref 8.9–10.3)
Chloride: 101 mmol/L (ref 98–111)
Creatinine, Ser: 1.87 mg/dL — ABNORMAL HIGH (ref 0.61–1.24)
GFR, Estimated: 36 mL/min — ABNORMAL LOW (ref >60)
Glucose, Bld: 147 mg/dL — ABNORMAL HIGH (ref 70–99)
Potassium: 3.1 mmol/L — ABNORMAL LOW (ref 3.5–5.1)
Sodium: 144 mmol/L (ref 135–145)

## 2021-08-13 LAB — RESP PANEL BY RT-PCR (FLU A&B, COVID) ARPGX2
Influenza A by PCR: NEGATIVE
Influenza B by PCR: NEGATIVE
SARS Coronavirus 2 by RT PCR: NEGATIVE

## 2021-08-13 MED ORDER — PANTOPRAZOLE SODIUM 40 MG PO TBEC: 40.0000 mg | DELAYED_RELEASE_TABLET | Freq: Two times a day (BID) | ORAL | 11 refills | Status: AC

## 2021-08-13 MED ORDER — POTASSIUM CHLORIDE CRYS ER 20 MEQ PO TBCR: 40.0000 meq | EXTENDED_RELEASE_TABLET | Freq: Every day | ORAL | 0 refills | Status: DC

## 2021-08-13 MED ORDER — CEFADROXIL 500 MG PO CAPS: 500.0000 mg | ORAL_CAPSULE | Freq: Two times a day (BID) | ORAL | 0 refills | Status: AC

## 2021-08-13 MED ORDER — TRAMADOL HCL 50 MG PO TABS
50.0000 mg | ORAL_TABLET | Freq: Once | ORAL | Status: AC
  Administered 2021-08-13: 50 mg via ORAL
  Filled 2021-08-13: qty 1

## 2021-08-13 MED ORDER — GERHARDT'S BUTT CREAM: 1.0000 "application " | TOPICAL_CREAM | Freq: Three times a day (TID) | CUTANEOUS | Status: DC

## 2021-08-13 MED ORDER — DOCUSATE SODIUM 100 MG PO CAPS: 100.0000 mg | ORAL_CAPSULE | Freq: Two times a day (BID) | ORAL | 0 refills | Status: AC

## 2021-08-13 MED ORDER — XARELTO 20 MG PO TABS: 20.0000 mg | ORAL_TABLET | Freq: Every day | ORAL | 3 refills | Status: AC

## 2021-08-13 NOTE — Progress Notes (Signed)
Attempted to call report multiple time to [facility] no answer. Pt IV taken out. Pt D/C to facility and accompanied by transport personnel.

## 2021-08-13 NOTE — TOC Progression Note (Signed)
Transition of Care [facility]) - Progression Note    Patient Details  Name: Quention Mcneill MRN: [id number] Date of Birth: 1940-12-07  [facility] CM/SW Contact  Purcell Mouton, RN Phone Number: 08/13/2021, 12:33 PM  Clinical Narrative:     Please note on pt's Medicare Health Insurance His name is Lennon Alstrom.   Expected Discharge Plan: [place] Barriers to Discharge: No Barriers Identified  Expected Discharge Plan and Services Expected Discharge Plan: [place] Choice: [place] arrangements for the past 2 months: [place]                                       Social Determinants of Health (SDOH) Interventions    Readmission Risk Interventions No flowsheet data found.

## 2021-08-13 NOTE — Discharge Summary (Addendum)
Physician Discharge Summary  [facility] TJQ:[id number] DOB: 02/25/1941 DOA: 08/09/2021  PCP: Patrick Amel, MD  Admit date: 08/09/2021 Discharge date: 08/13/2021  Admitted From: Home.  Disposition:  SNF [facility]  Recommendations for Outpatient Follow-up:  Follow up with PCP in 1-2 weeks Please obtain BMP/CBC in one week Please follow up with gastroenterology as needed.    Discharge Condition:guarded.  CODE STATUS:Partial Code.  Diet recommendation: Heart Healthy   Brief/Interim Summary: 80 year old male with history of atrial fibrillation on anticoagulation, chronic diastolic heart failure, hypertension, history of bladder cancer s/p stenting on the left ureter secondary to hydronephrosis.  Presents with bilateral lower extremity edema, shortness of breath, unable to ambulate. He was admitted for acute on chronic diastolic heart failure and also was found to have a urinary tract infection. Hospital course complicated by multiple episodes of coffee-ground emesis. Gastroenterology consulted and he underwent EGD showing LA Grade D esophagitis with no bleeding. Highly likely source of hematemesis.Gastritis. recommended holding Xarelto for 7 days, soft diet and IV PPI for 48 hours.   Discharge Diagnoses:  Active Problems:   Generalized weakness   Pressure injury of skin  Acute on chronic diastolic heart failure Improving with improving pedal edema.  Patient was started on IV Lasix 80 mg twice daily transition to oral torsemide on discharge.  Patient has significant bilateral leg edema, shortness of breath on talking. Diuresed about 4 L since admission.  Continue with strict intake and output and daily weights. Echocardiogram shows Left ventricular ejection fraction is 60 to 65%.,has normal function, no regional wall motion abnormalities. There is mild concentric left ventricular hypertrophy. Left ventricular diastolic parameters are indeterminate. There is the interventricular  septum is  flattened in systole and diastole, consistent with right ventricular pressure and volume overload.  ABIs show Resting right ankle-brachial index is within normal range. No  evidence of significant right lower extremity arterial disease. Left: Resting left ankle-brachial index indicates noncompressible left lower extremity arteries.         Gram-negative UTI Transition to oral antibiotics on discharge.      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 Follows up with [facility]. Follows up with [facility] for stent exchange every 3 months. Urology has seen the patient recommended 14 days of antibiotics followed by prophylactic antibiotic with either Macrobid or trimethoprim once acute infection has been treated. Renal ultrasound shows Mild left-sided hydronephrosis.    Permanent atrial fibrillation CHA2DS2-VASc score of 5 on Xarelto for anticoagulation and rate control with Toprol. Xarelto on hold for coffee-ground emesis/hematemesis for 7 days.  Restart Xarelto after 7 days.         Pressure injury present on admission Pressure Injury 08/09/21 Buttocks Left Stage 2 -  Partial thickness loss of dermis presenting as a shallow open injury with a red, pink wound bed without slough. Purple non-blancheable area surrounding stage 2, MASD to area also (Active)  08/09/21 1800  Location: Buttocks  Location Orientation: Left  Staging: Stage 2 -  Partial thickness loss of dermis presenting as a shallow open injury with a red, pink wound bed without slough.  Wound Description (Comments): Purple non-blancheable area surrounding stage 2, MASD to area also  Present on Admission: Yes     Pressure Injury 08/09/21 Heel Left Deep Tissue Pressure Injury - Purple or maroon localized area of discolored intact skin or blood-filled blister due to damage of underlying soft tissue from pressure and/or shear. (Active)  08/09/21 1745  Location: Heel  Location Orientation: Left  Staging: Deep Tissue Pressure Injury - Purple or maroon localized area of discolored intact skin or blood-filled blister due to damage of underlying soft tissue from pressure and/or shear.  Wound Description (Comments):   Present on Admission: Yes  Wound care consulted and recommendations given.      Stage IIIb CKD Creatinine better than baseline.       Anemia of chronic disease/acute anemia from hematemesis/coffee-ground emesis. Baseline hemoglobin appears to be between 9-11.  Patient was admitted with a hemoglobin of 9.5, and improving.  Transfuse to keep hemoglobin greater than 7.       Obstructive sleep apnea      Morbid obesity Body mass index is 38.68 kg/m. Increased risk of morbidity and mortality.       Generalized weakness/unable to ambulate due to lower extremity edema Therapy evaluations recommending SNF.     Severe hypokalemia Potassium supplementation added Normal mag levels.        Discharge Instructions  Discharge Instructions     Diet - low sodium heart healthy   Complete by: As directed    Discharge wound care:   Complete by: As directed    Place as many Xeroform or Vaseline gauzes as necessary to cover any open areas, then ABD pads. Beginning behind the toes and going to just below the knees, spiral wrap kerlix, then 4 inch or 6 inch ace wrap. Perform daily   Increase activity slowly   Complete by: As directed       Allergies as of 08/13/2021       Reactions   Aldactone [spironolactone] Other (See Comments)   unknown        Medication List     TAKE these medications    atorvastatin 10 MG tablet Commonly known as: LIPITOR TAKE 1 TABLET DAILY   cefadroxil 500 MG capsule Commonly known as: DURICEF Take 1 capsule (500 mg total) by mouth 2 (two) times daily for 5 days.   docusate sodium 100 MG capsule Commonly known as: COLACE Take 1 capsule (100 mg total) by mouth 2 (two) times daily.    Gerhardt's butt cream Crea Apply 1 application topically 3 (three) times daily.   loperamide 2 MG tablet Commonly known as: IMODIUM A-D Take 2-4 mg by mouth 4 (four) times daily as needed for diarrhea or loose stools.   metoprolol tartrate 25 MG tablet Commonly known as: LOPRESSOR Take 25-37.5 mg by mouth See admin instructions. Take 25 mg in the morning and 37.5 mg in the evening   pantoprazole 40 MG tablet Commonly known as: Protonix Take 1 tablet (40 mg total) by mouth 2 (two) times daily.   potassium chloride SA 20 MEQ tablet Commonly known as: KLOR-CON Take 2 tablets (40 mEq total) by mouth daily.   prochlorperazine 10 MG tablet Commonly known as: COMPAZINE TAKE 1 TABLET(10 MG) BY MOUTH EVERY 6 HOURS AS NEEDED FOR NAUSEA OR VOMITING What changed: See the new instructions.   tamsulosin 0.4 MG Caps capsule Commonly known as: FLOMAX Take 0.4 mg by mouth daily.   torsemide 20 MG tablet Commonly known as: DEMADEX Take 4 tablets in the AM and 3 tablets in the PM alternating with 3 tablets in the AM and 2 tablets in the PM What changed:  how much to take how to take this when to take this   Xarelto 20 MG Tabs tablet Generic drug: rivaroxaban Take 1 tablet (20 mg total) by mouth daily with supper. Start taking on:  August 19, 2021 What changed:  See the new instructions. These instructions start on August 19, 2021. If you are unsure what to do until then, ask your doctor or other care provider.               Discharge Care Instructions  (From admission, onward)           Start     Ordered   08/13/21 0000  Discharge wound care:       Comments: Place as many Xeroform or Vaseline gauzes as necessary to cover any open areas, then ABD pads. Beginning behind the toes and going to just below the knees, spiral wrap kerlix, then 4 inch or 6 inch ace wrap. Perform daily   08/13/21 1349            Contact information for after-discharge care      Destination     [facility] Preferred SNF .   Service: Skilled Nursing Contact information: [place] [phone]                    Allergies  Allergen Reactions   Aldactone [Spironolactone] Other (See Comments)    unknown     Consultations: Gastroenterology.    Procedures/Studies: DG Abd 1 View  Result Date: 08/11/2021 CLINICAL DATA:  Nausea and vomiting EXAM: ABDOMEN - 1 VIEW COMPARISON:  None. FINDINGS: The stomach is significantly distended with air. Left ureteral stent is noted in satisfactory position. Bilateral venous stents are noted and stable. No free air is seen. Degenerative changes of lumbar spine are noted. IMPRESSION: Significant gaseous distension of the stomach. No other acute abnormality is seen. Electronically Signed   By: Inez Catalina M.D.   On: 08/11/2021 03:02   US RENAL  Result Date: 08/10/2021 CLINICAL DATA:  UTI. EXAM: RENAL / URINARY TRACT ULTRASOUND COMPLETE COMPARISON:  None. FINDINGS: Right Kidney: Renal measurements: 11.3 cm x 6.4 cm x 6.5 cm = volume: 245.8 mL. Echogenicity within normal limits. No mass or hydronephrosis visualized. Left Kidney: Renal measurements: 10.8 cm x 5.0 cm x 5.5 cm = volume: 154.3 mL. Echogenicity within normal limits. No mass is visualized. Mild left-sided hydronephrosis is seen. Bladder: The urinary bladder is empty. Other: None. IMPRESSION: Mild left-sided hydronephrosis. Electronically Signed   By: Virgina Norfolk M.D.   On: 08/10/2021 19:27   DG Chest Port 1 View  Result Date: 08/09/2021 CLINICAL DATA:  Swelling EXAM: PORTABLE CHEST 1 VIEW COMPARISON:  08/02/2019 FINDINGS: Cardiomegaly. Both lungs are clear. The visualized skeletal structures are unremarkable. IMPRESSION: Cardiomegaly without acute abnormality of the lungs in AP portable projection. Electronically Signed   By: Delanna Ahmadi M.D.   On: 08/09/2021 09:18   VAS [place] ABI WITH/WO  TBI  Result Date: 08/10/2021  LOWER EXTREMITY DOPPLER STUDY Patient Name:  [facility]  Date of Exam:   08/10/2021 Medical Rec #: [id number]        Accession #:    [id number] Date of Birth: 12-Feb-1941        Patient Gender: M Patient Age:   85 years Exam Location:  [facility] Procedure:      VAS [place] ABI WITH/WO TBI Referring Phys: Jolaine Artist MEMON --------------------------------------------------------------------------------  Indications: Peripheral artery disease. High Risk Factors: Hypertension, hyperlipidemia. Other Factors: Chronic venous insufficiency.  Limitations: Today's exam was limited due to an open wound and patient body  habitus. Comparison Study: No prior studies. Performing Technologist: Carlos Levering RVT  Examination Guidelines: A complete evaluation includes at minimum, Doppler waveform signals and systolic blood pressure reading at the level of bilateral brachial, anterior tibial, and posterior tibial arteries, when vessel segments are accessible. Bilateral testing is considered an integral part of a complete examination. Photoelectric Plethysmograph (PPG) waveforms and toe systolic pressure readings are included as required and additional duplex testing as needed. Limited examinations for reoccurring indications may be performed as noted.  ABI Findings: +--------+------------------+-----+---------+--------+ Right   Rt Pressure (mmHg)IndexWaveform Comment  +--------+------------------+-----+---------+--------+ [id number]                    triphasic         +--------+------------------+-----+---------+--------+ PTA     132               1.13 biphasic          +--------+------------------+-----+---------+--------+ DP      102               0.87 biphasic          +--------+------------------+-----+---------+--------+ +--------+------------------+-----+---------+-------+ Left    Lt Pressure (mmHg)IndexWaveform Comment  +--------+------------------+-----+---------+-------+ [id number]                    triphasic        +--------+------------------+-----+---------+-------+ PTA     254               2.17 biphasic         +--------+------------------+-----+---------+-------+ DP      131               1.12 biphasic         +--------+------------------+-----+---------+-------+ +-------+-----------+-----------+------------+------------+ ABI/TBIToday's ABIToday's TBIPrevious ABIPrevious TBI +-------+-----------+-----------+------------+------------+ Right  1.13                                           +-------+-----------+-----------+------------+------------+ Left   [place]                                             +-------+-----------+-----------+------------+------------+   Summary: Right: Resting right ankle-brachial index is within normal range. No evidence of significant right lower extremity arterial disease. Left: Resting left ankle-brachial index indicates noncompressible left lower extremity arteries.  *See table(s) above for measurements and observations.  Electronically signed by Deitra Mayo MD on 08/10/2021 at 1:53:29 PM.    Final    ECHOCARDIOGRAM COMPLETE  Result Date: 08/10/2021    ECHOCARDIOGRAM REPORT   Patient Name:   [facility] Date of Exam: 08/10/2021 Medical Rec #:  [id number]       Height:       73.0 in Accession #:    [id number]      Weight:       304.2 lb Date of Birth:  1941-05-02       BSA:          2.572 m Patient Age:    34 years        BP:           104/50 mmHg Patient Gender: M               HR:           74 bpm.  Exam Location:  Inpatient Procedure: 2D Echo, Color Doppler, Cardiac Doppler and Intracardiac            Opacification Agent Indications:    CHF  History:        Patient has prior history of Echocardiogram examinations. CHF,                 Pulmonary HTN; Risk Factors:Hypertension and Sleep Apnea.  Sonographer:    Jyl Heinz Referring Phys:  [id number] [place]  1. Left ventricular ejection fraction, by estimation, is 60 to 65%. The left ventricle has normal function. The left ventricle has no regional wall motion abnormalities. There is mild concentric left ventricular hypertrophy. Left ventricular diastolic parameters are indeterminate. There is the interventricular septum is flattened in systole and diastole, consistent with right ventricular pressure and volume overload.  2. Right ventricular systolic function is low normal. The right ventricular size is severely enlarged.  3. Left atrial size was moderately dilated.  4. Right atrial size was severely dilated.  5. The mitral valve is normal in structure. No evidence of mitral valve regurgitation. No evidence of mitral stenosis.  6. Tricuspid valve regurgitation is moderate.  7. The aortic valve is tricuspid. Aortic valve regurgitation is not visualized. No aortic stenosis is present.  8. There is color/spectral Doppler discrepancy in pulmonic regurgitation signal, study may underestimated the level of regurgitation. FINDINGS  Left Ventricle: Left ventricular ejection fraction, by estimation, is 60 to 65%. The left ventricle has normal function. The left ventricle has no regional wall motion abnormalities. The left ventricular internal cavity size was normal in size. There is  mild concentric left ventricular hypertrophy. The interventricular septum is flattened in systole and diastole, consistent with right ventricular pressure and volume overload. Left ventricular diastolic parameters are indeterminate. Right Ventricle: The right ventricular size is severely enlarged. No increase in right ventricular wall thickness. Right ventricular systolic function is low normal. Left Atrium: Left atrial size was moderately dilated. Right Atrium: Right atrial size was severely dilated. Pericardium: There is no evidence of pericardial effusion. Mitral Valve: The mitral valve is normal in structure.  No evidence of mitral valve regurgitation. No evidence of mitral valve stenosis. Tricuspid Valve: The tricuspid valve is normal in structure. Tricuspid valve regurgitation is moderate . No evidence of tricuspid stenosis. Aortic Valve: The aortic valve is tricuspid. Aortic valve regurgitation is not visualized. No aortic stenosis is present. Aortic valve peak gradient measures 8.4 mmHg. Pulmonic Valve: The pulmonic valve was not well visualized. Pulmonic valve regurgitation is mild. No evidence of pulmonic stenosis. Aorta: The aortic root and ascending aorta are structurally normal, with no evidence of dilitation. Pulmonary Artery: There is color/spectral Doppler discrepancy in pulmonic regurgitation signal, study may underestimated the level of regurgitation. IAS/Shunts: The atrial septum is grossly normal.  LEFT VENTRICLE PLAX 2D LVIDd:         5.90 cm      Diastology LVIDs:         3.80 cm      LV e' medial:    8.92 cm/s LV PW:         1.30 cm      LV E/e' medial:  11.1 LV IVS:        1.20 cm      LV e' lateral:   11.20 cm/s LVOT diam:     2.00 cm      LV E/e' lateral: 8.8 LV SV:  64 LV SV Index:   25 LVOT Area:     3.14 cm  LV Volumes (MOD) LV vol d, MOD A2C: 142.0 ml LV vol d, MOD A4C: 131.0 ml LV vol s, MOD A2C: 54.6 ml LV vol s, MOD A4C: 50.4 ml LV SV MOD A2C:     87.4 ml LV SV MOD A4C:     131.0 ml LV SV MOD BP:      86.7 ml RIGHT VENTRICLE             IVC RV Basal diam:  5.50 cm     IVC diam: 2.10 cm RV Mid diam:    4.10 cm RV S prime:     11.70 cm/s TAPSE (M-mode): 2.0 cm LEFT ATRIUM              Index        RIGHT ATRIUM           Index LA diam:        4.70 cm  1.83 cm/m   RA Area:     35.20 cm LA Vol (A2C):   96.8 ml  37.64 ml/m  RA Volume:   137.00 ml 53.28 ml/m LA Vol (A4C):   108.0 ml 42.00 ml/m LA Biplane Vol: 104.0 ml 40.44 ml/m  AORTIC VALVE AV Area (Vmax): 2.53 cm AV Vmax:        145.00 cm/s AV Peak Grad:   8.4 mmHg LVOT Vmax:      117.00 cm/s LVOT Vmean:     85.900 cm/s LVOT VTI:        0.204 m  AORTA Ao Root diam: 3.50 cm Ao Asc diam:  3.30 cm MITRAL VALVE               TRICUSPID VALVE MV Area (PHT): 3.66 cm    TR Peak grad:   41.0 mmHg MV Decel Time: 207 msec    TR Vmax:        320.00 cm/s MV E velocity: 99.00 cm/s                            SHUNTS                            Systemic VTI:  0.20 m                            Systemic Diam: 2.00 cm Rudean Haskell MD Electronically signed by Rudean Haskell MD Signature Date/Time: 08/10/2021/3:36:58 PM    Final    VAS [place] LOWER EXTREMITY VENOUS (DVT)  Result Date: 08/11/2021  Lower Venous DVT Study Patient Name:  [facility]  Date of Exam:   08/11/2021 Medical Rec #: [id number]        Accession #:    [id number] Date of Birth: 25-Jul-1941        Patient Gender: M Patient Age:   85 years Exam Location:  [facility] Procedure:      VAS [place] LOWER EXTREMITY VENOUS (DVT) Referring Phys: [name] --------------------------------------------------------------------------------  Indications: Edema.  Risk Factors: None identified. Limitations: Body habitus, poor ultrasound/tissue interface, bandages and Unna boots. Comparison Study: No prior studies. Performing Technologist: Oliver Hum RVT  Examination Guidelines: A complete evaluation includes B-mode imaging, spectral Doppler, color Doppler, and power Doppler as needed of all accessible portions of each vessel. Bilateral testing  is considered an integral part of a complete examination. Limited examinations for reoccurring indications may be performed as noted. The reflux portion of the exam is performed with the patient in reverse Trendelenburg.  +---------+---------------+---------+-----------+----------+-------------------+ RIGHT    CompressibilityPhasicitySpontaneityPropertiesThrombus Aging      +---------+---------------+---------+-----------+----------+-------------------+ CFV      Full           Yes      Yes                                       +---------+---------------+---------+-----------+----------+-------------------+ SFJ      Full                                                             +---------+---------------+---------+-----------+----------+-------------------+ FV Prox  Full                                                             +---------+---------------+---------+-----------+----------+-------------------+ FV Mid   Full                                                             +---------+---------------+---------+-----------+----------+-------------------+ FV DistalFull                                                             +---------+---------------+---------+-----------+----------+-------------------+ PFV      Full                                                             +---------+---------------+---------+-----------+----------+-------------------+ POP      Full           Yes      Yes                                      +---------+---------------+---------+-----------+----------+-------------------+ PTV                                                   Not well visualized +---------+---------------+---------+-----------+----------+-------------------+ PERO  Not well visualized +---------+---------------+---------+-----------+----------+-------------------+   +---------+---------------+---------+-----------+----------+-------------------+ LEFT     CompressibilityPhasicitySpontaneityPropertiesThrombus Aging      +---------+---------------+---------+-----------+----------+-------------------+ CFV      Full           Yes      Yes                                      +---------+---------------+---------+-----------+----------+-------------------+ SFJ      Full                                                             +---------+---------------+---------+-----------+----------+-------------------+ FV  Prox  Full                                                             +---------+---------------+---------+-----------+----------+-------------------+ FV Mid   Full                                                             +---------+---------------+---------+-----------+----------+-------------------+ FV DistalFull                                                             +---------+---------------+---------+-----------+----------+-------------------+ PFV      Full                                                             +---------+---------------+---------+-----------+----------+-------------------+ POP      Full           Yes      Yes                                      +---------+---------------+---------+-----------+----------+-------------------+ PTV                                                   Not well visualized +---------+---------------+---------+-----------+----------+-------------------+ PERO                                                  Not well visualized +---------+---------------+---------+-----------+----------+-------------------+     Summary: RIGHT: - There is no evidence of deep vein thrombosis in  the lower extremity. However, portions of this examination were limited- see technologist comments above.  - No cystic structure found in the popliteal fossa.  LEFT: - There is no evidence of deep vein thrombosis in the lower extremity. However, portions of this examination were limited- see technologist comments above.  - No cystic structure found in the popliteal fossa.  *See table(s) above for measurements and observations. Electronically signed by Harold Barban MD on 08/11/2021 at 8:54:28 PM.    Final    CT Super D Chest Wo Contrast  Result Date: 08/06/2021 CLINICAL DATA:  Lung nodule, follow-up. EXAM: CT CHEST WITHOUT CONTRAST TECHNIQUE: Multidetector CT imaging of the chest was performed using thin slice collimation for  electromagnetic bronchoscopy planning purposes, without intravenous contrast. COMPARISON:  05/24/2021 FINDINGS: Cardiovascular: Heart size is enlarged. There is no pericardial effusion. Aortic atherosclerosis. Coronary artery calcifications. Mediastinum/Nodes: No enlarged mediastinal, hilar, or axillary lymph nodes. Thyroid gland, trachea, and esophagus demonstrate no significant findings. Lungs/Pleura: There is marked asymmetric elevation of the right hemidiaphragm, unchanged from previous exam. Overlying atelectatic changes are noted which along with respiratory motion artifact obscure the suspected, central peribronchovascular right middle lobe lung nodule. This measures approximately 1.5 x 1.1 cm, image 81/3. A corresponding nodule is not confidently identified on the coronal reformatted images. Unchanged appearance of small perifissural nodules along the major and minor fissure, image 73/3 and image 72/3. Right upper lobe lung nodule measures 4 mm, image 65/3. Not significantly changed in the interval. Upper Abdomen: No acute abnormality. Left nephroureteral stent is partially visualized. Gallstones noted within the neck of gallbladder. Aortic atherosclerosis. 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 Multilevel disc space narrowing and endplate spurring identified throughout the thoracic spine. IMPRESSION: 1. Stable appearance of marked asymmetric elevation of the right hemidiaphragm with overlying atelectatic changes. 2. Again seen is a suggestion of a central, peribronchovascular nodule within the right middle lobe. However, given the respiratory motion artifact noted on this exam as well as the atelectatic changes and asymmetric elevation of the right hemidiaphragm a definitive nodule within this area cannot be confirmed with a high degree of certainty. Furthermore there is no  corresponding nodular density identified on the coronal reformatted images. 3. Additional small right lung nodules are stable. 4. Gallstones. 5. Large left chest wall lipoma. 6. Aortic Atherosclerosis (ICD10-I70.0). Electronically Signed   By: Kerby Moors M.D.   On: 08/06/2021 13:30      Subjective: No new complaints,   Discharge Exam: Vitals:   08/13/21 0448 08/13/21 1249  BP: 119/63 106/71  Pulse: 64 84  Resp: 20 18  Temp: 98 F (36.7 C) 98.4 F (36.9 C)  SpO2: 94% 92%   Vitals:   08/12/21 2158 08/13/21 0448 08/13/21 0500 08/13/21 1249  BP: 135/67 119/63  106/71  Pulse: 74 64  84  Resp: 19 20  18   Temp: 98.9 F (37.2 C) 98 F (36.7 C)  98.4 F (36.9 C)  TempSrc: Oral   Oral  SpO2: 94% 94%  92%  Weight:   133 kg   Height:        General: Pt is alert, awake, not in acute distress Cardiovascular: RRR, S1/S2 +, no rubs, no gallops Respiratory: CTA bilaterally, no wheezing, no rhonchi Abdominal: Soft, NT, ND, bowel sounds + Extremities: > 3+ edema     The results of significant diagnostics from this hospitalization (including imaging, microbiology, ancillary  and laboratory) are listed below for reference.     Microbiology: Recent Results (from the past 240 hour(s))  Urine Culture     Status: Abnormal   Collection Time: 08/09/21 10:17 AM   Specimen: Urine, Catheterized  Result Value Ref Range Status   Specimen Description   Final    URINE, CATHETERIZED Performed at [facility] [address], [place], [place] [address]    Special Requests   Final    NONE Performed at [facility], [place] [address], [place], [place] [address]    Culture >=100,000 COLONIES/mL ESCHERICHIA COLI (A)  Final   Report Status 08/11/2021 FINAL  Final   Organism ID, Bacteria ESCHERICHIA COLI (A)  Final      Susceptibility   Escherichia coli - MIC*    AMPICILLIN 8 SENSITIVE Sensitive     CEFAZOLIN <=4 SENSITIVE Sensitive     CEFEPIME <=0.12  SENSITIVE Sensitive     CEFTRIAXONE <=0.25 SENSITIVE Sensitive     CIPROFLOXACIN <=0.25 SENSITIVE Sensitive     GENTAMICIN <=1 SENSITIVE Sensitive     IMIPENEM <=0.25 SENSITIVE Sensitive     NITROFURANTOIN <=16 SENSITIVE Sensitive     TRIMETH/SULFA <=20 SENSITIVE Sensitive     AMPICILLIN/SULBACTAM <=2 SENSITIVE Sensitive     PIP/TAZO <=4 SENSITIVE Sensitive     * >=100,000 COLONIES/mL ESCHERICHIA COLI  Resp Panel by RT-PCR (Flu A&B, Covid) Nasopharyngeal Swab     Status: None   Collection Time: 08/09/21 10:53 AM   Specimen: Nasopharyngeal Swab; Nasopharyngeal(NP) swabs in vial transport medium  Result Value Ref Range Status   SARS Coronavirus 2 by RT PCR NEGATIVE NEGATIVE Final    Comment: (NOTE) SARS-CoV-2 target nucleic acids are NOT DETECTED.  The SARS-CoV-2 RNA is generally detectable in upper respiratory specimens during the acute phase of infection. The lowest concentration of SARS-CoV-2 viral copies this assay can detect is 138 copies/mL. A negative result does not preclude SARS-Cov-2 infection and should not be used as the sole basis for treatment or other patient management decisions. A negative result may occur with  improper specimen collection/handling, submission of specimen other than nasopharyngeal swab, presence of viral mutation(s) within the areas targeted by this assay, and inadequate number of viral copies(<138 copies/mL). A negative result must be combined with clinical observations, patient history, and epidemiological information. The expected result is Negative.  Fact Sheet for Patients:  EntrepreneurPulse.com.au  Fact Sheet for Healthcare Providers:  IncredibleEmployment.be  This test is no t yet approved or cleared by the [place] FDA and  has been authorized for detection and/or diagnosis of SARS-CoV-2 by FDA under an Emergency Use Authorization (EUA). This EUA will remain  in effect (meaning this test can be  used) for the duration of the COVID-19 declaration under Section 564(b)(1) of the Act, 21 U.S.C.section 360bbb-3(b)(1), unless the authorization is terminated  or revoked sooner.       Influenza A by PCR NEGATIVE NEGATIVE Final   Influenza B by PCR NEGATIVE NEGATIVE Final    Comment: (NOTE) The Xpert Xpress SARS-CoV-2/FLU/RSV plus assay is intended as an aid in the diagnosis of influenza from Nasopharyngeal swab specimens and should not be used as a sole basis for treatment. Nasal washings and aspirates are unacceptable for Xpert Xpress SARS-CoV-2/FLU/RSV testing.  Fact Sheet for Patients: EntrepreneurPulse.com.au  Fact Sheet for Healthcare Providers: IncredibleEmployment.be  This test is not yet approved or cleared by the [place] FDA and has been authorized for detection and/or diagnosis of SARS-CoV-2  by FDA under an Emergency Use Authorization (EUA). This EUA will remain in effect (meaning this test can be used) for the duration of the COVID-19 declaration under Section 564(b)(1) of the Act, 21 U.S.C. section 360bbb-3(b)(1), unless the authorization is terminated or revoked.  Performed at [facility], [place] [address], [place], [place] [address]   Resp Panel by RT-PCR (Flu A&B, Covid) Nasopharyngeal Swab     Status: None   Collection Time: 08/13/21 11:27 AM   Specimen: Nasopharyngeal Swab; Nasopharyngeal(NP) swabs in vial transport medium  Result Value Ref Range Status   SARS Coronavirus 2 by RT PCR NEGATIVE NEGATIVE Final    Comment: (NOTE) SARS-CoV-2 target nucleic acids are NOT DETECTED.  The SARS-CoV-2 RNA is generally detectable in upper respiratory specimens during the acute phase of infection. The lowest concentration of SARS-CoV-2 viral copies this assay can detect is 138 copies/mL. A negative result does not preclude SARS-Cov-2 infection and should not be used as the sole basis for treatment or other  patient management decisions. A negative result may occur with  improper specimen collection/handling, submission of specimen other than nasopharyngeal swab, presence of viral mutation(s) within the areas targeted by this assay, and inadequate number of viral copies(<138 copies/mL). A negative result must be combined with clinical observations, patient history, and epidemiological information. The expected result is Negative.  Fact Sheet for Patients:  EntrepreneurPulse.com.au  Fact Sheet for Healthcare Providers:  IncredibleEmployment.be  This test is no t yet approved or cleared by the [place] FDA and  has been authorized for detection and/or diagnosis of SARS-CoV-2 by FDA under an Emergency Use Authorization (EUA). This EUA will remain  in effect (meaning this test can be used) for the duration of the COVID-19 declaration under Section 564(b)(1) of the Act, 21 U.S.C.section 360bbb-3(b)(1), unless the authorization is terminated  or revoked sooner.       Influenza A by PCR NEGATIVE NEGATIVE Final   Influenza B by PCR NEGATIVE NEGATIVE Final    Comment: (NOTE) The Xpert Xpress SARS-CoV-2/FLU/RSV plus assay is intended as an aid in the diagnosis of influenza from Nasopharyngeal swab specimens and should not be used as a sole basis for treatment. Nasal washings and aspirates are unacceptable for Xpert Xpress SARS-CoV-2/FLU/RSV testing.  Fact Sheet for Patients: EntrepreneurPulse.com.au  Fact Sheet for Healthcare Providers: IncredibleEmployment.be  This test is not yet approved or cleared by the [place] FDA and has been authorized for detection and/or diagnosis of SARS-CoV-2 by FDA under an Emergency Use Authorization (EUA). This EUA will remain in effect (meaning this test can be used) for the duration of the COVID-19 declaration under Section 564(b)(1) of the Act, 21 U.S.C. section  360bbb-3(b)(1), unless the authorization is terminated or revoked.  Performed at [facility], [place] [address] [place], [place] [address]      Labs: BNP (last 3 results) Recent Labs    08/09/21 0924  BNP 326.7*   Basic Metabolic Panel: Recent Labs  Lab 08/09/21 0924 08/10/21 0351 08/11/21 0058 08/11/21 1404 08/12/21 0339 08/13/21 0845  NA 137 139 141  --  145 144  K 3.2* 2.9* 2.9* 2.9* 3.3* 3.1*  CL 100 103 102  --  102 101  CO2 26 27 28   --  34* 34*  GLUCOSE 158* 149* 174*  --  163* 147*  BUN 36* 37* 37*  --  47* 51*  CREATININE 2.17* 2.03* 1.89*  --  1.76* 1.87*  CALCIUM 8.5* 8.0* 8.1*  --  8.6* 8.4*  MG 2.2  --  2.1  --   --   --    Liver Function Tests: Recent Labs  Lab 08/09/21 0924 08/10/21 0351  AST 23 19  ALT 14 14  ALKPHOS 72 60  BILITOT 1.6* 1.0  PROT 7.6 6.4*  ALBUMIN 3.4* 2.6*   No results for input(s): LIPASE, AMYLASE in the last 168 hours. No results for input(s): AMMONIA in the last 168 hours. CBC: Recent Labs  Lab 08/09/21 0924 08/10/21 0351 08/10/21 1853 08/11/21 0058 08/12/21 0339  WBC 7.7 6.2  --  7.1 7.3  HGB 9.5* 8.4* 9.4* 9.5* 10.6*  HCT 29.3* 26.4* 29.4* 29.5* 33.7*  MCV 91.6 93.3  --  93.4 93.1  PLT 207 177  --  206 276   Cardiac Enzymes: No results for input(s): CKTOTAL, CKMB, CKMBINDEX, TROPONINI in the last 168 hours. BNP: Invalid input(s): POCBNP CBG: No results for input(s): GLUCAP in the last 168 hours. D-Dimer No results for input(s): DDIMER in the last 72 hours. Hgb A1c No results for input(s): HGBA1C in the last 72 hours. Lipid Profile No results for input(s): CHOL, HDL, LDLCALC, TRIG, CHOLHDL, LDLDIRECT in the last 72 hours. Thyroid function studies No results for input(s): TSH, T4TOTAL, T3FREE, THYROIDAB in the last 72 hours.  Invalid input(s): FREET3 Anemia work up No results for input(s): VITAMINB12, FOLATE, FERRITIN, TIBC, IRON, RETICCTPCT in the last 72 hours. Urinalysis     Component Value Date/Time   COLORURINE YELLOW 08/09/2021 1017   APPEARANCEUR CLOUDY (A) 08/09/2021 1017   LABSPEC 1.009 08/09/2021 1017   PHURINE 5.0 08/09/2021 1017   GLUCOSEU NEGATIVE 08/09/2021 1017   HGBUR LARGE (A) 08/09/2021 1017   BILIRUBINUR NEGATIVE 08/09/2021 1017   KETONESUR NEGATIVE 08/09/2021 1017   PROTEINUR 30 (A) 08/09/2021 1017   NITRITE POSITIVE (A) 08/09/2021 1017   LEUKOCYTESUR LARGE (A) 08/09/2021 1017   Sepsis Labs Invalid input(s): PROCALCITONIN,  WBC,  LACTICIDVEN Microbiology Recent Results (from the past 240 hour(s))  Urine Culture     Status: Abnormal   Collection Time: 08/09/21 10:17 AM   Specimen: Urine, Catheterized  Result Value Ref Range Status   Specimen Description   Final    URINE, CATHETERIZED Performed at [facility], [place] [address], [place], [place] [address]    Special Requests   Final    NONE Performed at [facility], [place] [address], [place], [place] [address]    Culture >=100,000 COLONIES/mL ESCHERICHIA COLI (A)  Final   Report Status 08/11/2021 FINAL  Final   Organism ID, Bacteria ESCHERICHIA COLI (A)  Final      Susceptibility   Escherichia coli - MIC*    AMPICILLIN 8 SENSITIVE Sensitive     CEFAZOLIN <=4 SENSITIVE Sensitive     CEFEPIME <=0.12 SENSITIVE Sensitive     CEFTRIAXONE <=0.25 SENSITIVE Sensitive     CIPROFLOXACIN <=0.25 SENSITIVE Sensitive     GENTAMICIN <=1 SENSITIVE Sensitive     IMIPENEM <=0.25 SENSITIVE Sensitive     NITROFURANTOIN <=16 SENSITIVE Sensitive     TRIMETH/SULFA <=20 SENSITIVE Sensitive     AMPICILLIN/SULBACTAM <=2 SENSITIVE Sensitive     PIP/TAZO <=4 SENSITIVE Sensitive     * >=100,000 COLONIES/mL ESCHERICHIA COLI  Resp Panel by RT-PCR (Flu A&B, Covid) Nasopharyngeal Swab     Status: None   Collection Time: 08/09/21 10:53 AM   Specimen: Nasopharyngeal Swab; Nasopharyngeal(NP) swabs in vial transport medium  Result Value Ref Range Status   SARS Coronavirus  2 by RT PCR NEGATIVE NEGATIVE Final    Comment: (NOTE) SARS-CoV-2 target nucleic acids are NOT DETECTED.  The SARS-CoV-2 RNA is generally detectable in upper respiratory specimens during the acute phase of infection. The lowest concentration of SARS-CoV-2 viral copies this assay can detect is 138 copies/mL. A negative result does not preclude SARS-Cov-2 infection and should not be used as the sole basis for treatment or other patient management decisions. A negative result may occur with  improper specimen collection/handling, submission of specimen other than nasopharyngeal swab, presence of viral mutation(s) within the areas targeted by this assay, and inadequate number of viral copies(<138 copies/mL). A negative result must be combined with clinical observations, patient history, and epidemiological information. The expected result is Negative.  Fact Sheet for Patients:  EntrepreneurPulse.com.au  Fact Sheet for Healthcare Providers:  IncredibleEmployment.be  This test is no t yet approved or cleared by the [place] FDA and  has been authorized for detection and/or diagnosis of SARS-CoV-2 by FDA under an Emergency Use Authorization (EUA). This EUA will remain  in effect (meaning this test can be used) for the duration of the COVID-19 declaration under Section 564(b)(1) of the Act, 21 U.S.C.section 360bbb-3(b)(1), unless the authorization is terminated  or revoked sooner.       Influenza A by PCR NEGATIVE NEGATIVE Final   Influenza B by PCR NEGATIVE NEGATIVE Final    Comment: (NOTE) The Xpert Xpress SARS-CoV-2/FLU/RSV plus assay is intended as an aid in the diagnosis of influenza from Nasopharyngeal swab specimens and should not be used as a sole basis for treatment. Nasal washings and aspirates are unacceptable for Xpert Xpress SARS-CoV-2/FLU/RSV testing.  Fact Sheet for Patients: EntrepreneurPulse.com.au  Fact  Sheet for Healthcare Providers: IncredibleEmployment.be  This test is not yet approved or cleared by the [place] FDA and has been authorized for detection and/or diagnosis of SARS-CoV-2 by FDA under an Emergency Use Authorization (EUA). This EUA will remain in effect (meaning this test can be used) for the duration of the COVID-19 declaration under Section 564(b)(1) of the Act, 21 U.S.C. section 360bbb-3(b)(1), unless the authorization is terminated or revoked.  Performed at [facility], [place] [address], [place], [place] [address]   Resp Panel by RT-PCR (Flu A&B, Covid) Nasopharyngeal Swab     Status: None   Collection Time: 08/13/21 11:27 AM   Specimen: Nasopharyngeal Swab; Nasopharyngeal(NP) swabs in vial transport medium  Result Value Ref Range Status   SARS Coronavirus 2 by RT PCR NEGATIVE NEGATIVE Final    Comment: (NOTE) SARS-CoV-2 target nucleic acids are NOT DETECTED.  The SARS-CoV-2 RNA is generally detectable in upper respiratory specimens during the acute phase of infection. The lowest concentration of SARS-CoV-2 viral copies this assay can detect is 138 copies/mL. A negative result does not preclude SARS-Cov-2 infection and should not be used as the sole basis for treatment or other patient management decisions. A negative result may occur with  improper specimen collection/handling, submission of specimen other than nasopharyngeal swab, presence of viral mutation(s) within the areas targeted by this assay, and inadequate number of viral copies(<138 copies/mL). A negative result must be combined with clinical observations, patient history, and epidemiological information. The expected result is Negative.  Fact Sheet for Patients:  EntrepreneurPulse.com.au  Fact Sheet for Healthcare Providers:  IncredibleEmployment.be  This test is no t yet approved or cleared by the [place] FDA and   has been authorized for detection and/or diagnosis of SARS-CoV-2 by FDA under an Emergency  Use Authorization (EUA). This EUA will remain  in effect (meaning this test can be used) for the duration of the COVID-19 declaration under Section 564(b)(1) of the Act, 21 U.S.C.section 360bbb-3(b)(1), unless the authorization is terminated  or revoked sooner.       Influenza A by PCR NEGATIVE NEGATIVE Final   Influenza B by PCR NEGATIVE NEGATIVE Final    Comment: (NOTE) The Xpert Xpress SARS-CoV-2/FLU/RSV plus assay is intended as an aid in the diagnosis of influenza from Nasopharyngeal swab specimens and should not be used as a sole basis for treatment. Nasal washings and aspirates are unacceptable for Xpert Xpress SARS-CoV-2/FLU/RSV testing.  Fact Sheet for Patients: EntrepreneurPulse.com.au  Fact Sheet for Healthcare Providers: IncredibleEmployment.be  This test is not yet approved or cleared by the [place] FDA and has been authorized for detection and/or diagnosis of SARS-CoV-2 by FDA under an Emergency Use Authorization (EUA). This EUA will remain in effect (meaning this test can be used) for the duration of the COVID-19 declaration under Section 564(b)(1) of the Act, 21 U.S.C. section 360bbb-3(b)(1), unless the authorization is terminated or revoked.  Performed at [facility], [place] [address] [place], [place] [address]      Time coordinating discharge: 36 minutes.   SIGNED:   [name], MD  [facility]

## 2021-08-16 DIAGNOSIS — R5381 Other malaise: Secondary | ICD-10-CM | POA: Diagnosis not present

## 2021-08-16 DIAGNOSIS — I5032 Chronic diastolic (congestive) heart failure: Secondary | ICD-10-CM | POA: Diagnosis not present

## 2021-08-16 DIAGNOSIS — I4821 Permanent atrial fibrillation: Secondary | ICD-10-CM | POA: Diagnosis not present

## 2021-08-16 DIAGNOSIS — N1832 Chronic kidney disease, stage 3b: Secondary | ICD-10-CM | POA: Diagnosis not present

## 2021-08-20 ENCOUNTER — Telehealth: Payer: Self-pay | Admitting: Pulmonary Disease

## 2021-08-20 DIAGNOSIS — R911 Solitary pulmonary nodule: Secondary | ICD-10-CM

## 2021-08-20 NOTE — Telephone Encounter (Signed)
Patrick Garfinkel, MD  08/18/2021 11:38 AM EST     Please let the patient know that CT shows stable lung nodules . Order follow up CT in 1 year          Called pt and notified of results  Nothing further needed Order placed for repeat ct in a year

## 2021-08-26 DIAGNOSIS — I4891 Unspecified atrial fibrillation: Secondary | ICD-10-CM | POA: Diagnosis not present

## 2021-08-26 DIAGNOSIS — G4733 Obstructive sleep apnea (adult) (pediatric): Secondary | ICD-10-CM | POA: Diagnosis present

## 2021-08-26 DIAGNOSIS — R000 Tachycardia, unspecified: Secondary | ICD-10-CM | POA: Diagnosis not present

## 2021-08-26 DIAGNOSIS — N400 Enlarged prostate without lower urinary tract symptoms: Secondary | ICD-10-CM | POA: Diagnosis present

## 2021-08-26 DIAGNOSIS — I7143 Infrarenal abdominal aortic aneurysm, without rupture: Secondary | ICD-10-CM | POA: Diagnosis present

## 2021-08-26 DIAGNOSIS — I5033 Acute on chronic diastolic (congestive) heart failure: Secondary | ICD-10-CM | POA: Diagnosis not present

## 2021-08-26 DIAGNOSIS — I5032 Chronic diastolic (congestive) heart failure: Secondary | ICD-10-CM | POA: Diagnosis present

## 2021-08-26 DIAGNOSIS — N1832 Chronic kidney disease, stage 3b: Secondary | ICD-10-CM | POA: Diagnosis present

## 2021-08-26 DIAGNOSIS — R197 Diarrhea, unspecified: Secondary | ICD-10-CM | POA: Diagnosis not present

## 2021-08-26 DIAGNOSIS — I4821 Permanent atrial fibrillation: Secondary | ICD-10-CM | POA: Diagnosis present

## 2021-08-26 DIAGNOSIS — K828 Other specified diseases of gallbladder: Secondary | ICD-10-CM | POA: Diagnosis not present

## 2021-08-26 DIAGNOSIS — D638 Anemia in other chronic diseases classified elsewhere: Secondary | ICD-10-CM | POA: Diagnosis present

## 2021-08-26 DIAGNOSIS — E860 Dehydration: Secondary | ICD-10-CM | POA: Diagnosis present

## 2021-08-26 DIAGNOSIS — I959 Hypotension, unspecified: Secondary | ICD-10-CM | POA: Diagnosis not present

## 2021-08-26 DIAGNOSIS — R531 Weakness: Secondary | ICD-10-CM | POA: Diagnosis not present

## 2021-08-26 DIAGNOSIS — I872 Venous insufficiency (chronic) (peripheral): Secondary | ICD-10-CM | POA: Diagnosis not present

## 2021-08-26 DIAGNOSIS — R001 Bradycardia, unspecified: Secondary | ICD-10-CM | POA: Diagnosis not present

## 2021-08-26 DIAGNOSIS — Z01818 Encounter for other preprocedural examination: Secondary | ICD-10-CM | POA: Diagnosis not present

## 2021-08-26 DIAGNOSIS — J9611 Chronic respiratory failure with hypoxia: Secondary | ICD-10-CM | POA: Diagnosis present

## 2021-08-26 DIAGNOSIS — L89622 Pressure ulcer of left heel, stage 2: Secondary | ICD-10-CM | POA: Diagnosis present

## 2021-08-26 DIAGNOSIS — N132 Hydronephrosis with renal and ureteral calculous obstruction: Secondary | ICD-10-CM | POA: Diagnosis not present

## 2021-08-26 DIAGNOSIS — A419 Sepsis, unspecified organism: Secondary | ICD-10-CM | POA: Diagnosis not present

## 2021-08-26 DIAGNOSIS — Z20822 Contact with and (suspected) exposure to covid-19: Secondary | ICD-10-CM | POA: Diagnosis present

## 2021-08-26 DIAGNOSIS — C670 Malignant neoplasm of trigone of bladder: Secondary | ICD-10-CM | POA: Diagnosis present

## 2021-08-26 DIAGNOSIS — I890 Lymphedema, not elsewhere classified: Secondary | ICD-10-CM | POA: Diagnosis not present

## 2021-08-26 DIAGNOSIS — R2689 Other abnormalities of gait and mobility: Secondary | ICD-10-CM | POA: Diagnosis not present

## 2021-08-26 DIAGNOSIS — N178 Other acute kidney failure: Secondary | ICD-10-CM | POA: Diagnosis not present

## 2021-08-26 DIAGNOSIS — L89322 Pressure ulcer of left buttock, stage 2: Secondary | ICD-10-CM | POA: Diagnosis not present

## 2021-08-26 DIAGNOSIS — I129 Hypertensive chronic kidney disease with stage 1 through stage 4 chronic kidney disease, or unspecified chronic kidney disease: Secondary | ICD-10-CM | POA: Diagnosis not present

## 2021-08-26 DIAGNOSIS — N390 Urinary tract infection, site not specified: Secondary | ICD-10-CM | POA: Diagnosis present

## 2021-08-26 DIAGNOSIS — Z466 Encounter for fitting and adjustment of urinary device: Secondary | ICD-10-CM | POA: Diagnosis not present

## 2021-08-26 DIAGNOSIS — K8020 Calculus of gallbladder without cholecystitis without obstruction: Secondary | ICD-10-CM | POA: Diagnosis not present

## 2021-08-26 DIAGNOSIS — D631 Anemia in chronic kidney disease: Secondary | ICD-10-CM | POA: Diagnosis not present

## 2021-08-26 DIAGNOSIS — J9811 Atelectasis: Secondary | ICD-10-CM | POA: Diagnosis not present

## 2021-08-26 DIAGNOSIS — R32 Unspecified urinary incontinence: Secondary | ICD-10-CM | POA: Diagnosis present

## 2021-08-26 DIAGNOSIS — Z23 Encounter for immunization: Secondary | ICD-10-CM | POA: Diagnosis not present

## 2021-08-26 DIAGNOSIS — E785 Hyperlipidemia, unspecified: Secondary | ICD-10-CM | POA: Diagnosis present

## 2021-08-26 DIAGNOSIS — N179 Acute kidney failure, unspecified: Secondary | ICD-10-CM | POA: Diagnosis present

## 2021-08-26 DIAGNOSIS — G894 Chronic pain syndrome: Secondary | ICD-10-CM | POA: Diagnosis not present

## 2021-08-26 DIAGNOSIS — R41841 Cognitive communication deficit: Secondary | ICD-10-CM | POA: Diagnosis not present

## 2021-08-26 DIAGNOSIS — I10 Essential (primary) hypertension: Secondary | ICD-10-CM | POA: Diagnosis not present

## 2021-08-26 DIAGNOSIS — L309 Dermatitis, unspecified: Secondary | ICD-10-CM | POA: Diagnosis not present

## 2021-08-26 DIAGNOSIS — G8929 Other chronic pain: Secondary | ICD-10-CM | POA: Diagnosis not present

## 2021-08-26 DIAGNOSIS — M1990 Unspecified osteoarthritis, unspecified site: Secondary | ICD-10-CM | POA: Diagnosis not present

## 2021-08-26 DIAGNOSIS — R0902 Hypoxemia: Secondary | ICD-10-CM | POA: Diagnosis not present

## 2021-08-26 DIAGNOSIS — N1330 Unspecified hydronephrosis: Secondary | ICD-10-CM | POA: Diagnosis not present

## 2021-08-26 DIAGNOSIS — R6510 Systemic inflammatory response syndrome (SIRS) of non-infectious origin without acute organ dysfunction: Secondary | ICD-10-CM | POA: Diagnosis present

## 2021-08-26 DIAGNOSIS — E861 Hypovolemia: Secondary | ICD-10-CM | POA: Diagnosis present

## 2021-08-26 DIAGNOSIS — I130 Hypertensive heart and chronic kidney disease with heart failure and stage 1 through stage 4 chronic kidney disease, or unspecified chronic kidney disease: Secondary | ICD-10-CM | POA: Diagnosis present

## 2021-08-26 DIAGNOSIS — H9190 Unspecified hearing loss, unspecified ear: Secondary | ICD-10-CM | POA: Diagnosis not present

## 2021-08-26 DIAGNOSIS — M6281 Muscle weakness (generalized): Secondary | ICD-10-CM | POA: Diagnosis not present

## 2021-08-26 DIAGNOSIS — R262 Difficulty in walking, not elsewhere classified: Secondary | ICD-10-CM | POA: Diagnosis not present

## 2021-08-26 DIAGNOSIS — E872 Acidosis, unspecified: Secondary | ICD-10-CM | POA: Diagnosis present

## 2021-08-26 DIAGNOSIS — R6520 Severe sepsis without septic shock: Secondary | ICD-10-CM | POA: Diagnosis not present

## 2021-08-26 DIAGNOSIS — I272 Pulmonary hypertension, unspecified: Secondary | ICD-10-CM | POA: Diagnosis present

## 2021-08-26 DIAGNOSIS — R159 Full incontinence of feces: Secondary | ICD-10-CM | POA: Diagnosis present

## 2021-08-26 DIAGNOSIS — R739 Hyperglycemia, unspecified: Secondary | ICD-10-CM | POA: Diagnosis not present

## 2021-08-27 DIAGNOSIS — I4821 Permanent atrial fibrillation: Secondary | ICD-10-CM | POA: Diagnosis not present

## 2021-08-27 DIAGNOSIS — R5381 Other malaise: Secondary | ICD-10-CM | POA: Diagnosis not present

## 2021-08-27 DIAGNOSIS — I5032 Chronic diastolic (congestive) heart failure: Secondary | ICD-10-CM | POA: Diagnosis not present

## 2021-08-27 DIAGNOSIS — N1832 Chronic kidney disease, stage 3b: Secondary | ICD-10-CM | POA: Diagnosis not present

## 2021-09-02 ENCOUNTER — Encounter (HOSPITAL_BASED_OUTPATIENT_CLINIC_OR_DEPARTMENT_OTHER): Payer: Medicare Other | Admitting: Internal Medicine

## 2021-09-07 ENCOUNTER — Other Ambulatory Visit: Payer: Self-pay | Admitting: *Deleted

## 2021-09-07 NOTE — Patient Outreach (Signed)
Member screened for potential [facility] Care Management needs. Mr. Berkovich resides in [place] [facility].  Communication sent to [facility] SW to inquire about transition plans.  Will continue to follow while member resides in SNF.   Marthenia Rolling, MSN, RN,BSN [place] Acute Care Coordinator [phone] [facility]) [phone]  (Toll free office)

## 2021-09-08 ENCOUNTER — Other Ambulatory Visit: Payer: Self-pay | Admitting: *Deleted

## 2021-09-08 DIAGNOSIS — Z01818 Encounter for other preprocedural examination: Secondary | ICD-10-CM | POA: Diagnosis not present

## 2021-09-08 DIAGNOSIS — L309 Dermatitis, unspecified: Secondary | ICD-10-CM | POA: Diagnosis not present

## 2021-09-08 NOTE — Patient Outreach (Signed)
[facility] Post- Acute Care Coordinator follow up. Mr. Kluever resides in [facility]. Screened for potential [facility] Care Management needs.   Update received from [place] [place] [facility] SW indicating member's goal is to return to [facility] [employer]. Meeting to be scheduled with family to confirm.   Writer will continue to follow for potential [facility] needs.    Marthenia Rolling, MSN, RN,BSN [place] Acute Care Coordinator [phone] [facility]) [phone]  (Toll free office)

## 2021-09-14 DIAGNOSIS — N1832 Chronic kidney disease, stage 3b: Secondary | ICD-10-CM | POA: Diagnosis not present

## 2021-09-14 DIAGNOSIS — I5032 Chronic diastolic (congestive) heart failure: Secondary | ICD-10-CM | POA: Diagnosis not present

## 2021-09-14 DIAGNOSIS — C670 Malignant neoplasm of trigone of bladder: Secondary | ICD-10-CM | POA: Diagnosis not present

## 2021-09-14 DIAGNOSIS — I4821 Permanent atrial fibrillation: Secondary | ICD-10-CM | POA: Diagnosis not present

## 2021-09-16 ENCOUNTER — Encounter (HOSPITAL_COMMUNITY): Payer: Self-pay

## 2021-09-16 ENCOUNTER — Emergency Department (HOSPITAL_COMMUNITY): Payer: Medicare Other

## 2021-09-16 ENCOUNTER — Inpatient Hospital Stay (HOSPITAL_COMMUNITY)
Admission: EM | Admit: 2021-09-16 | Discharge: 2021-09-21 | DRG: 683 | Disposition: A | Discharge status: Skilled Nursing Facility | Payer: Medicare Other | Source: Skilled Nursing Facility | Attending: Family Medicine | Admitting: Family Medicine

## 2021-09-16 DIAGNOSIS — R6510 Systemic inflammatory response syndrome (SIRS) of non-infectious origin without acute organ dysfunction: Secondary | ICD-10-CM | POA: Diagnosis present

## 2021-09-16 DIAGNOSIS — I130 Hypertensive heart and chronic kidney disease with heart failure and stage 1 through stage 4 chronic kidney disease, or unspecified chronic kidney disease: Secondary | ICD-10-CM | POA: Diagnosis present

## 2021-09-16 DIAGNOSIS — R6520 Severe sepsis without septic shock: Secondary | ICD-10-CM | POA: Diagnosis not present

## 2021-09-16 DIAGNOSIS — Z91199 Patient's noncompliance with other medical treatment and regimen due to unspecified reason: Secondary | ICD-10-CM

## 2021-09-16 DIAGNOSIS — Z9989 Dependence on other enabling machines and devices: Secondary | ICD-10-CM

## 2021-09-16 DIAGNOSIS — I959 Hypotension, unspecified: Secondary | ICD-10-CM

## 2021-09-16 DIAGNOSIS — R159 Full incontinence of feces: Secondary | ICD-10-CM | POA: Diagnosis present

## 2021-09-16 DIAGNOSIS — Z6831 Body mass index (BMI) 31.0-31.9, adult: Secondary | ICD-10-CM

## 2021-09-16 DIAGNOSIS — Z20822 Contact with and (suspected) exposure to covid-19: Secondary | ICD-10-CM | POA: Diagnosis present

## 2021-09-16 DIAGNOSIS — D638 Anemia in other chronic diseases classified elsewhere: Secondary | ICD-10-CM | POA: Diagnosis not present

## 2021-09-16 DIAGNOSIS — N132 Hydronephrosis with renal and ureteral calculous obstruction: Secondary | ICD-10-CM

## 2021-09-16 DIAGNOSIS — G8929 Other chronic pain: Secondary | ICD-10-CM | POA: Diagnosis not present

## 2021-09-16 DIAGNOSIS — E861 Hypovolemia: Secondary | ICD-10-CM | POA: Diagnosis present

## 2021-09-16 DIAGNOSIS — M6259 Muscle wasting and atrophy, not elsewhere classified, multiple sites: Secondary | ICD-10-CM | POA: Diagnosis not present

## 2021-09-16 DIAGNOSIS — I4821 Permanent atrial fibrillation: Secondary | ICD-10-CM | POA: Diagnosis present

## 2021-09-16 DIAGNOSIS — N179 Acute kidney failure, unspecified: Secondary | ICD-10-CM | POA: Diagnosis not present

## 2021-09-16 DIAGNOSIS — Z800 Family history of malignant neoplasm of digestive organs: Secondary | ICD-10-CM

## 2021-09-16 DIAGNOSIS — N1832 Chronic kidney disease, stage 3b: Secondary | ICD-10-CM | POA: Diagnosis present

## 2021-09-16 DIAGNOSIS — N178 Other acute kidney failure: Secondary | ICD-10-CM | POA: Diagnosis not present

## 2021-09-16 DIAGNOSIS — E785 Hyperlipidemia, unspecified: Secondary | ICD-10-CM | POA: Diagnosis not present

## 2021-09-16 DIAGNOSIS — J9611 Chronic respiratory failure with hypoxia: Secondary | ICD-10-CM | POA: Diagnosis present

## 2021-09-16 DIAGNOSIS — E872 Acidosis, unspecified: Secondary | ICD-10-CM | POA: Diagnosis not present

## 2021-09-16 DIAGNOSIS — R000 Tachycardia, unspecified: Secondary | ICD-10-CM | POA: Diagnosis not present

## 2021-09-16 DIAGNOSIS — Z87442 Personal history of urinary calculi: Secondary | ICD-10-CM

## 2021-09-16 DIAGNOSIS — L89622 Pressure ulcer of left heel, stage 2: Secondary | ICD-10-CM | POA: Diagnosis present

## 2021-09-16 DIAGNOSIS — Z888 Allergy status to other drugs, medicaments and biological substances status: Secondary | ICD-10-CM

## 2021-09-16 DIAGNOSIS — Z8249 Family history of ischemic heart disease and other diseases of the circulatory system: Secondary | ICD-10-CM

## 2021-09-16 DIAGNOSIS — I272 Pulmonary hypertension, unspecified: Secondary | ICD-10-CM | POA: Diagnosis present

## 2021-09-16 DIAGNOSIS — G4733 Obstructive sleep apnea (adult) (pediatric): Secondary | ICD-10-CM | POA: Diagnosis present

## 2021-09-16 DIAGNOSIS — A419 Sepsis, unspecified organism: Secondary | ICD-10-CM

## 2021-09-16 DIAGNOSIS — Z23 Encounter for immunization: Secondary | ICD-10-CM | POA: Diagnosis not present

## 2021-09-16 DIAGNOSIS — N390 Urinary tract infection, site not specified: Secondary | ICD-10-CM | POA: Diagnosis not present

## 2021-09-16 DIAGNOSIS — R2689 Other abnormalities of gait and mobility: Secondary | ICD-10-CM | POA: Diagnosis not present

## 2021-09-16 DIAGNOSIS — I2510 Atherosclerotic heart disease of native coronary artery without angina pectoris: Secondary | ICD-10-CM | POA: Diagnosis present

## 2021-09-16 DIAGNOSIS — Z7901 Long term (current) use of anticoagulants: Secondary | ICD-10-CM

## 2021-09-16 DIAGNOSIS — I5032 Chronic diastolic (congestive) heart failure: Secondary | ICD-10-CM | POA: Insufficient documentation

## 2021-09-16 DIAGNOSIS — C670 Malignant neoplasm of trigone of bladder: Secondary | ICD-10-CM | POA: Diagnosis not present

## 2021-09-16 DIAGNOSIS — Z87891 Personal history of nicotine dependence: Secondary | ICD-10-CM

## 2021-09-16 DIAGNOSIS — I872 Venous insufficiency (chronic) (peripheral): Secondary | ICD-10-CM | POA: Diagnosis not present

## 2021-09-16 DIAGNOSIS — I7143 Infrarenal abdominal aortic aneurysm, without rupture: Secondary | ICD-10-CM | POA: Diagnosis present

## 2021-09-16 DIAGNOSIS — R197 Diarrhea, unspecified: Secondary | ICD-10-CM | POA: Diagnosis not present

## 2021-09-16 DIAGNOSIS — R32 Unspecified urinary incontinence: Secondary | ICD-10-CM | POA: Diagnosis present

## 2021-09-16 DIAGNOSIS — R001 Bradycardia, unspecified: Secondary | ICD-10-CM | POA: Diagnosis not present

## 2021-09-16 DIAGNOSIS — I4891 Unspecified atrial fibrillation: Secondary | ICD-10-CM | POA: Diagnosis not present

## 2021-09-16 DIAGNOSIS — Z466 Encounter for fitting and adjustment of urinary device: Secondary | ICD-10-CM | POA: Diagnosis not present

## 2021-09-16 DIAGNOSIS — R41841 Cognitive communication deficit: Secondary | ICD-10-CM | POA: Diagnosis not present

## 2021-09-16 DIAGNOSIS — Z743 Need for continuous supervision: Secondary | ICD-10-CM | POA: Diagnosis not present

## 2021-09-16 DIAGNOSIS — R739 Hyperglycemia, unspecified: Secondary | ICD-10-CM | POA: Diagnosis not present

## 2021-09-16 DIAGNOSIS — G894 Chronic pain syndrome: Secondary | ICD-10-CM | POA: Diagnosis not present

## 2021-09-16 DIAGNOSIS — J9811 Atelectasis: Secondary | ICD-10-CM | POA: Diagnosis not present

## 2021-09-16 DIAGNOSIS — E860 Dehydration: Secondary | ICD-10-CM | POA: Diagnosis present

## 2021-09-16 DIAGNOSIS — Z993 Dependence on wheelchair: Secondary | ICD-10-CM

## 2021-09-16 DIAGNOSIS — N1330 Unspecified hydronephrosis: Secondary | ICD-10-CM | POA: Diagnosis not present

## 2021-09-16 DIAGNOSIS — Z86010 Personal history of colonic polyps: Secondary | ICD-10-CM

## 2021-09-16 DIAGNOSIS — I10 Essential (primary) hypertension: Secondary | ICD-10-CM | POA: Diagnosis present

## 2021-09-16 DIAGNOSIS — K8020 Calculus of gallbladder without cholecystitis without obstruction: Secondary | ICD-10-CM | POA: Diagnosis not present

## 2021-09-16 DIAGNOSIS — N400 Enlarged prostate without lower urinary tract symptoms: Secondary | ICD-10-CM | POA: Diagnosis present

## 2021-09-16 DIAGNOSIS — N3000 Acute cystitis without hematuria: Secondary | ICD-10-CM | POA: Diagnosis not present

## 2021-09-16 DIAGNOSIS — I5033 Acute on chronic diastolic (congestive) heart failure: Secondary | ICD-10-CM | POA: Insufficient documentation

## 2021-09-16 DIAGNOSIS — K828 Other specified diseases of gallbladder: Secondary | ICD-10-CM | POA: Diagnosis not present

## 2021-09-16 DIAGNOSIS — N189 Chronic kidney disease, unspecified: Secondary | ICD-10-CM | POA: Diagnosis present

## 2021-09-16 DIAGNOSIS — Z79899 Other long term (current) drug therapy: Secondary | ICD-10-CM

## 2021-09-16 DIAGNOSIS — M6281 Muscle weakness (generalized): Secondary | ICD-10-CM | POA: Diagnosis not present

## 2021-09-16 DIAGNOSIS — Z6835 Body mass index (BMI) 35.0-35.9, adult: Secondary | ICD-10-CM

## 2021-09-16 DIAGNOSIS — R531 Weakness: Secondary | ICD-10-CM | POA: Diagnosis not present

## 2021-09-16 DIAGNOSIS — Z7401 Bed confinement status: Secondary | ICD-10-CM | POA: Diagnosis not present

## 2021-09-16 DIAGNOSIS — R0902 Hypoxemia: Secondary | ICD-10-CM | POA: Diagnosis not present

## 2021-09-16 LAB — COMPREHENSIVE METABOLIC PANEL
ALT: 12 U/L (ref 0–44)
AST: 20 U/L (ref 15–41)
Albumin: 3.3 g/dL — ABNORMAL LOW (ref 3.5–5.0)
Alkaline Phosphatase: 74 U/L (ref 38–126)
Anion gap: 17 — ABNORMAL HIGH (ref 5–15)
BUN: 38 mg/dL — ABNORMAL HIGH (ref 8–23)
CO2: 18 mmol/L — ABNORMAL LOW (ref 22–32)
Calcium: 8.7 mg/dL — ABNORMAL LOW (ref 8.9–10.3)
Chloride: 101 mmol/L (ref 98–111)
Creatinine, Ser: 3.03 mg/dL — ABNORMAL HIGH (ref 0.61–1.24)
GFR, Estimated: 20 mL/min — ABNORMAL LOW (ref >60)
Glucose, Bld: 253 mg/dL — ABNORMAL HIGH (ref 70–99)
Potassium: 4.4 mmol/L (ref 3.5–5.1)
Sodium: 136 mmol/L (ref 135–145)
Total Bilirubin: 1.1 mg/dL (ref 0.3–1.2)
Total Protein: 7.4 g/dL (ref 6.5–8.1)

## 2021-09-16 LAB — CBC WITH DIFFERENTIAL/PLATELET
Abs Immature Granulocytes: 0.15 10*3/uL — ABNORMAL HIGH (ref 0.00–0.07)
Basophils Absolute: 0 10*3/uL (ref 0.0–0.1)
Basophils Relative: 0 %
Eosinophils Absolute: 0 10*3/uL (ref 0.0–0.5)
Eosinophils Relative: 0 %
HCT: 35.9 % — ABNORMAL LOW (ref 39.0–52.0)
Hemoglobin: 11.2 g/dL — ABNORMAL LOW (ref 13.0–17.0)
Immature Granulocytes: 2 %
Lymphocytes Relative: 5 %
Lymphs Abs: 0.4 10*3/uL — ABNORMAL LOW (ref 0.7–4.0)
MCH: 29.7 pg (ref 26.0–34.0)
MCHC: 31.2 g/dL (ref 30.0–36.0)
MCV: 95.2 fL (ref 80.0–100.0)
Monocytes Absolute: 0.5 10*3/uL (ref 0.1–1.0)
Monocytes Relative: 6 %
Neutro Abs: 8 10*3/uL — ABNORMAL HIGH (ref 1.7–7.7)
Neutrophils Relative %: 87 %
Platelets: 288 10*3/uL (ref 150–400)
RBC: 3.77 MIL/uL — ABNORMAL LOW (ref 4.22–5.81)
RDW: 15.7 % — ABNORMAL HIGH (ref 11.5–15.5)
WBC: 9.1 10*3/uL (ref 4.0–10.5)
nRBC: 0 % (ref 0.0–0.2)

## 2021-09-16 LAB — PROTIME-INR
INR: 1.3 — ABNORMAL HIGH (ref 0.8–1.2)
Prothrombin Time: 16.1 seconds — ABNORMAL HIGH (ref 11.4–15.2)

## 2021-09-16 LAB — RESP PANEL BY RT-PCR (FLU A&B, COVID) ARPGX2
Influenza A by PCR: NEGATIVE
Influenza B by PCR: NEGATIVE
SARS Coronavirus 2 by RT PCR: NEGATIVE

## 2021-09-16 LAB — LACTIC ACID, PLASMA: Lactic Acid, Venous: 7.2 mmol/L (ref 0.5–1.9)

## 2021-09-16 LAB — TROPONIN I (HIGH SENSITIVITY): Troponin I (High Sensitivity): 15 ng/L (ref <18)

## 2021-09-16 LAB — CBG MONITORING, ED: Glucose-Capillary: 231 mg/dL — ABNORMAL HIGH (ref 70–99)

## 2021-09-16 LAB — APTT: aPTT: 27 seconds (ref 24–36)

## 2021-09-16 LAB — BRAIN NATRIURETIC PEPTIDE: B Natriuretic Peptide: 83.2 pg/mL (ref 0.0–100.0)

## 2021-09-16 MED ORDER — LACTATED RINGERS IV BOLUS (SEPSIS)
1000.0000 mL | Freq: Once | INTRAVENOUS | Status: AC
  Administered 2021-09-16: 22:00:00 1000 mL via INTRAVENOUS

## 2021-09-16 MED ORDER — LACTATED RINGERS IV SOLN: INTRAVENOUS | Status: DC

## 2021-09-16 MED ORDER — METRONIDAZOLE 500 MG/100ML IV SOLN
500.0000 mg | Freq: Once | INTRAVENOUS | Status: AC
  Administered 2021-09-16: 23:00:00 500 mg via INTRAVENOUS
  Filled 2021-09-16: qty 100

## 2021-09-16 MED ORDER — LACTATED RINGERS IV BOLUS (SEPSIS)
1000.0000 mL | Freq: Once | INTRAVENOUS | Status: AC
  Administered 2021-09-17: 1000 mL via INTRAVENOUS

## 2021-09-16 MED ORDER — SODIUM CHLORIDE 0.9 % IV SOLN: INTRAVENOUS | Status: DC

## 2021-09-16 MED ORDER — LACTATED RINGERS IV BOLUS (SEPSIS)
1000.0000 mL | Freq: Once | INTRAVENOUS | Status: AC
  Administered 2021-09-16: 23:00:00 1000 mL via INTRAVENOUS

## 2021-09-16 MED ORDER — VANCOMYCIN HCL 2000 MG/400ML IV SOLN
2000.0000 mg | Freq: Once | INTRAVENOUS | Status: AC
  Administered 2021-09-16: 23:00:00 2000 mg via INTRAVENOUS
  Filled 2021-09-16: qty 400

## 2021-09-16 MED ORDER — SODIUM CHLORIDE 0.9 % IV BOLUS
500.0000 mL | Freq: Once | INTRAVENOUS | Status: AC
  Administered 2021-09-16: 21:00:00 500 mL via INTRAVENOUS

## 2021-09-16 MED ORDER — VANCOMYCIN HCL IN DEXTROSE 1-5 GM/200ML-% IV SOLN: 1000.0000 mg | Freq: Once | INTRAVENOUS | Status: DC

## 2021-09-16 MED ORDER — SODIUM CHLORIDE 0.9 % IV SOLN
2.0000 g | Freq: Once | INTRAVENOUS | Status: AC
  Administered 2021-09-16: 22:00:00 2 g via INTRAVENOUS
  Filled 2021-09-16: qty 2

## 2021-09-16 MED ORDER — LACTATED RINGERS IV BOLUS (SEPSIS)
500.0000 mL | Freq: Once | INTRAVENOUS | Status: AC
  Administered 2021-09-17: 02:00:00 500 mL via INTRAVENOUS

## 2021-09-16 NOTE — H&P (Signed)
Patrick Bishop DEY:[id number] DOB: [birth year] DOA: 09/16/2021     PCP: Lujean Amel, MD   Outpatient Specialists:   CARDS:   Dr. Ellyn Hack   Pulmonary    Dr. Vaughan Browner  Oncology   Dr. Alen Blew GI Dr. Paulita Fujita  [facility] )   Urology  Dr. Lovena Neighbours  Patient arrived to ER on 09/16/21 at 1953 Referred by Attending Fredia Sorrow, MD   [facility]:   [facility]  Chief Complaint:   Chief Complaint  Patient presents with   Weakness    HPI: Patrick Bishop is a 80 y.o. male with medical history significant of bladder Ca, atrial fibrillation and is on Xarelto.  CHF, HTN, abdominal aortic aneurysm.   left ureteral stent placed back in August.   Presented with   fatigue War reporting fatigue, blood pressures by EMS systolics were 31S Got 970 ml improved to 96  Patient had generalized weakness today felt lightheaded [facility] Reports decreased urination, and decreased PO intake  He is incontinent of bowels and bladder chronically and wheelchair bound No CP no Fever  admitted November 14th through November 18   Used to smoke, drinks rare  Stopped using CPAP  No blood in stool no abd pain Has left heal ulcer chronic Reports he has been agressivley diuresed and have lost up to 90 pounds  Has  been vaccinated against COVID and boosted had     Initial COVID TEST  NEGATIVE   Lab Results  Component Value Date   [place] 09/16/2021   [place] NEGATIVE 08/13/2021   [place] NEGATIVE 08/09/2021   [place] NEGATIVE 01/19/2021     Regarding pertinent Chronic problems:    Hyperlipidemia -  on statins lipitor Lipid Panel     Component Value Date/Time   TRIG 57 08/02/2019 2158     HTN on metoporlol   chronic CHF diastolic/systolic/ combined - last echo November 2022 EF 60 to 65%. Left ventricular diastolic  parameters are indeterminate On torsemide        obesity-   BMI Readings  from Last 1 Encounters:  09/16/21 31.66 kg/m     LEft Kidney stone - sp Ureteral stent  Bladder cancer - followed by Oncology last chemo was in June    OSA - noncompliant with CPAP     A. 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  Lab Results  Component Value Date   CREATININE 3.03 (H) 09/16/2021   CREATININE 1.87 (H) 08/13/2021   CREATININE 1.76 (H) 08/12/2021      BPH - on Flomax,       Chronic anemia - baseline hg Hemoglobin & Hematocrit  Recent Labs    08/11/21 0058 08/12/21 0339 09/16/21 2035  HGB 9.5* 10.6* 11.2*     While in ER: Clinical Course as of 09/16/21 2346  Thu Sep 16, 2021  2041 ED EKG [SZ]  2042 ED EKG [SZ]    Clinical Course User Index [SZ] Fredia Sorrow, MD     Ordered   CXR - Mild, stable right basilar linear atelectasis.  CTabd/pelvis - Left ureteral stent in  place with persistent mild hydronephrosis. No calculi are seen. Cholelithiasis without inflammatory change although a stone is noted within the gallbladder neck.   Following Medications were ordered in ER: Medications  0.9 %  sodium chloride infusion ( Intravenous New Bag/Given 09/16/21 2153)  lactated ringers infusion (has no administration in time range)  lactated ringers bolus 1,000 mL (0 mLs Intravenous Stopped 09/16/21 2300)    And  lactated ringers bolus 1,000 mL (1,000 mLs Intravenous New Bag/Given 09/16/21 2259)    And  lactated ringers bolus 1,000 mL (has no administration in time range)    And  lactated ringers bolus 500 mL (has no administration in time range)  metroNIDAZOLE (FLAGYL) IVPB 500 mg (500 mg Intravenous New Bag/Given 09/16/21 2303)  vancomycin (VANCOREADY) IVPB 2000 mg/400 mL (2,000 mg Intravenous New Bag/Given  09/16/21 2235)  sodium chloride 0.9 % bolus 500 mL (0 mLs Intravenous Stopped 09/16/21 2154)  ceFEPIme (MAXIPIME) 2 g in sodium chloride 0.9 % 100 mL IVPB (0 g Intravenous Stopped 09/16/21 2300)    _______________________________________________________ ER Provider Called:   Urology       They Recommend admit to medicine   Will see in AM      ED Triage Vitals [09/16/21 2023]  Enc Vitals Group     BP (!) 87/63     Pulse Rate 98     Resp (!) 24     Temp 98.8 F (37.1 C)     Temp Source Oral     SpO2 (!) 88 %     Weight 240 lb (108.9 kg)     Height      Head Circumference      Peak Flow      Pain Score 0     Pain Loc      Pain Edu?      Excl. in [place]?   TFTD(32)@     _________________________________________ Significant initial  Findings: Abnormal Labs Reviewed  COMPREHENSIVE METABOLIC PANEL - Abnormal; Notable for the following components:      Result Value   CO2 18 (*)    Glucose, Bld 253 (*)    BUN 38 (*)    Creatinine, Ser 3.03 (*)    Calcium 8.7 (*)    Albumin 3.3 (*)    GFR, Estimated 20 (*)    Anion gap 17 (*)    All other components within normal limits  CBC WITH DIFFERENTIAL/PLATELET - Abnormal; Notable for the following components:   RBC 3.77 (*)    Hemoglobin 11.2 (*)    HCT 35.9 (*)    RDW 15.7 (*)    Neutro Abs 8.0 (*)    Lymphs Abs 0.4 (*)    Abs Immature Granulocytes 0.15 (*)    All other components within normal limits  LACTIC ACID, PLASMA - Abnormal; Notable for the following components:   Lactic Acid, Venous 7.2 (*)    All other components within normal limits  PROTIME-INR - Abnormal; Notable for the following components:   Prothrombin Time 16.1 (*)    INR 1.3 (*)    All other components within normal limits  CBG MONITORING, ED - Abnormal; Notable for the following components:   Glucose-Capillary 231 (*)    All other components within normal limits     _________________________ Troponin 15 ECG: Ordered Personally reviewed by me  showing: HR : 79 Rhythm:   A.fib.     no evidence of ischemic changes QTC 481   ____________________ This patient meets SIRS Criteria  and may be septic.   The recent clinical data is shown below. Vitals:   09/16/21 2215 09/16/21 2230 09/16/21 2300 09/16/21 2305  BP: 112/70 108/64 105/63 105/63  Pulse: 83 77 73 79  Resp: 18 (!) 29 15 (!) 22  Temp:      TempSrc:      SpO2: 100% 100% 100% 100%  Weight:        WBC     Component Value Date/Time   WBC 9.1 09/16/2021 2035   LYMPHSABS 0.4 (L) 09/16/2021 2035   MONOABS 0.5 09/16/2021 2035   EOSABS 0.0 09/16/2021 2035   BASOSABS 0.0 09/16/2021 2035     Lactic Acid, Venous    Component Value Date/Time   LATICACIDVEN 7.2 (HH) 09/16/2021 2035     Procalcitonin   Ordered Lactic Acid, Venous    Component Value Date/Time   LATICACIDVEN 7.2 (HH) 09/16/2021 2035     UA  ordered   Urine analysis:    Component Value Date/Time   COLORURINE YELLOW 08/09/2021 1017   APPEARANCEUR CLOUDY (A) 08/09/2021 1017   LABSPEC 1.009 08/09/2021 1017   PHURINE 5.0 08/09/2021 1017   GLUCOSEU NEGATIVE 08/09/2021 1017   HGBUR LARGE (A) 08/09/2021 1017   BILIRUBINUR NEGATIVE 08/09/2021 [place] 08/09/2021 1017   PROTEINUR 30 (A) 08/09/2021 1017   NITRITE POSITIVE (A) 08/09/2021 1017   LEUKOCYTESUR LARGE (A) 08/09/2021 1017    Results for orders placed or performed during the hospital encounter of 09/16/21  Resp Panel by RT-PCR (Flu A&B, Covid) Nasopharyngeal Swab     Status: None   Collection Time: 09/16/21  8:43 PM   Specimen: Nasopharyngeal Swab; Nasopharyngeal(NP) swabs in vial transport medium  Result Value Ref Range Status   SARS Coronavirus 2 by RT PCR NEGATIVE NEGATIVE Final         Influenza A by PCR NEGATIVE NEGATIVE Final   Influenza B by PCR NEGATIVE NEGATIVE Final           _______________________________________________ Hospitalist was called for admission for AKI, dehydration possible SIRS  The  following Work up has been ordered so far:  Orders Placed This Encounter  Procedures   Culture, blood (Routine X 2) w Reflex to ID Panel   Resp Panel by RT-PCR (Flu A&B, Covid) Nasopharyngeal Swab   Urine Culture   DG Chest Port 1 View   CT Abdomen Pelvis Wo Contrast   Comprehensive metabolic panel   CBC with Differential   Urinalysis, Routine w reflex microscopic Urine, Clean Catch   Brain natriuretic peptide   Lactic acid, plasma   Protime-INR   APTT   Diet NPO time specified   Cardiac monitoring   Document height and weight   Assess and Document Glasgow Coma Scale   Document vital signs within 1-hour of fluid bolus completion. Notify provider of abnormal vital signs despite fluid resuscitation.   DO NOT delay antibiotics if unable to obtain blood culture.   Refer to Sidebar Report: Sepsis Sidebar ED/IP   Notify provider for difficulties obtaining IV access.   Insert peripheral IV x 2   Initiate Carrier Fluid Protocol   Code Sepsis activation.  This occurs automatically when order is signed and prioritizes pharmacy, lab, and radiology services for STAT collections and interventions.  If [facility] downtime, call [facility] [phone]) to activate Code Sepsis.   Consult to hospitalist   Pulse oximetry, continuous   CBG monitoring, ED   ED EKG   ED EKG 12-Lead   EKG 12-Lead  EKG 12-Lead   Saline lock IV     OTHER Significant initial  Findings:  labs showing:    Recent Labs  Lab 09/16/21 2035  NA 136  K 4.4  CO2 18*  GLUCOSE 253*  BUN 38*  CREATININE 3.03*  CALCIUM 8.7*    Cr   Up from baseline see below Lab Results  Component Value Date   CREATININE 3.03 (H) 09/16/2021   CREATININE 1.87 (H) 08/13/2021   CREATININE 1.76 (H) 08/12/2021    Recent Labs  Lab 09/16/21 2035  AST 20  ALT 12  ALKPHOS 74  BILITOT 1.1  PROT 7.4  ALBUMIN 3.3*   Lab Results  Component Value Date   CALCIUM 8.7 (L) 09/16/2021   PHOS 2.4 (L) 08/04/2019          Plt: Lab  Results  Component Value Date   PLT 288 09/16/2021      COVID-19 Labs  No results for input(s): DDIMER, FERRITIN, LDH, CRP in the last 72 hours.  Lab Results  Component Value Date   SARSCOV2NAA NEGATIVE 09/16/2021   SARSCOV2NAA NEGATIVE 08/13/2021   SARSCOV2NAA NEGATIVE 08/09/2021   [place] NEGATIVE 01/19/2021         Recent Labs  Lab 09/16/21 2035  WBC 9.1  NEUTROABS 8.0*  HGB 11.2*  HCT 35.9*  MCV 95.2  PLT 288    HG/HCT Up from baseline see below    Component Value Date/Time   HGB 11.2 (L) 09/16/2021 2035   HGB 9.0 (L) 04/06/2021 0945   HGB 12.1 (L) 01/15/2020 1559   HCT 35.9 (L) 09/16/2021 2035   HCT 38.1 01/15/2020 1559   MCV 95.2 09/16/2021 2035   MCV 93 01/15/2020 1559       Cardiac Panel (last 3 results) No results for input(s): CKTOTAL, CKMB, TROPONINI, RELINDX in the last 72 hours.  .car BNP (last 3 results) Recent Labs    08/09/21 0924 09/16/21 2035  BNP 217.3* 83.2     DM  labs:  HbA1C: Recent Labs    10/15/20 1455  HGBA1C 5.4       CBG (last 3)  Recent Labs    09/16/21 2029  GLUCAP 231*    Cultures:    Component Value Date/Time   SDES  08/09/2021 1017    URINE, CATHETERIZED Performed at [facility], [place] [address], [place], [place] [address]    [place]  08/09/2021 1017    NONE Performed at [facility], [place] [address] [place], [place] [address]    CULT >=100,000 COLONIES/mL ESCHERICHIA COLI (A) 08/09/2021 1017   REPTSTATUS 08/11/2021 FINAL 08/09/2021 1017     Radiological Exams on Admission: CT Abdomen Pelvis Wo Contrast  Result Date: 09/16/2021 CLINICAL DATA:  Acute abdominal pain, possible sepsis, history of left ureteral stent EXAM: CT ABDOMEN AND PELVIS WITHOUT CONTRAST TECHNIQUE: Multidetector CT imaging of the abdomen and pelvis was performed following the standard protocol without IV contrast. COMPARISON:  08/11/2021 FINDINGS: Lower chest: No acute abnormality.  Hepatobiliary: Liver is within normal limits. Gallbladder is well distended. A few small dependent gallstones are seen 1 of which lies in the gallbladder neck. No inflammatory changes are seen. Pancreas: Unremarkable. No pancreatic ductal dilatation or surrounding inflammatory changes. Spleen: Normal in size without focal abnormality. Adrenals/Urinary Tract: Adrenal glands are within normal limits. Right kidney shows no focal abnormality. No renal calculi are seen. Left kidney demonstrates mild cortical thinning. Left hydronephrosis is noted with a ureteral stent in place. No  definitive renal or ureteral stones are noted. The bladder is partially distended. Stomach/Bowel: The appendix is within normal limits. No obstructive or inflammatory changes of the colon are seen. Small bowel and stomach are within normal limits. Vascular/Lymphatic: Atherosclerotic calcifications are noted. Aneurysmal dilatation of the infrarenal aorta to 3.4 cm is noted. Bilateral iliac venous stents are noted. No significant lymphadenopathy is noted. Reproductive: Prostate is unremarkable. Other: No abdominal wall hernia or abnormality. No abdominopelvic ascites. Musculoskeletal: No acute or significant osseous findings. IMPRESSION: Left ureteral stent in place with persistent mild hydronephrosis. No calculi are seen. Cholelithiasis without inflammatory change although a stone is noted within the gallbladder neck. 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 Recommend follow-up every 3 years. Reference: J Am Coll Radiol 3299;24:268-341. Electronically Signed   By: Inez Catalina M.D.   On: 09/16/2021 23:08   DG Chest Port 1 View  Result Date: 09/16/2021 CLINICAL DATA:  Weakness and decreased oral intake. EXAM: PORTABLE CHEST 1 VIEW COMPARISON:  None. FINDINGS: Mild, stable linear atelectasis is seen within the right lung base. There is stable elevation of the right hemidiaphragm. There is no evidence of a pleural effusion or pneumothorax.  The heart size and mediastinal contours are within normal limits. The visualized skeletal structures are unremarkable. IMPRESSION: Mild, stable right basilar linear atelectasis. Electronically Signed   By: Virgina Norfolk M.D.   On: 09/16/2021 21:18   _______________________________________________________________________________________________________ Latest  Blood pressure 105/63, pulse 79, temperature 98.8 F (37.1 C), temperature source Oral, resp. rate (!) 22, weight 108.9 kg, SpO2 100 %.   Vitals  labs and radiology finding personally reviewed  Review of Systems:    Pertinent positives include:   fatigue,  weight loss change in color of urine Constitutional:  No weight loss, night sweats, Fevers, chills, HEENT:  No headaches, Difficulty swallowing,Tooth/dental problems,Sore throat,  No sneezing, itching, ear ache, nasal congestion, post nasal drip,  Cardio-vascular:  No chest pain, Orthopnea, PND, anasarca, dizziness, palpitations.no Bilateral lower extremity swelling  GI:  No heartburn, indigestion, abdominal pain, nausea, vomiting, diarrhea, change in bowel habits, loss of appetite, melena, blood in stool, hematemesis Resp:  no shortness of breath at rest. No dyspnea on exertion, No excess mucus, no productive cough, No non-productive cough, No coughing up of blood.No change in color of mucus.No wheezing. Skin:  no rash or lesions. No jaundice GU:  no dysuria, , no urgency or frequency. No straining to urinate.  No flank pain.  Musculoskeletal:  No joint pain or no joint swelling. No decreased range of motion. No back pain.  Psych:  No change in mood or affect. No depression or anxiety. No memory loss.  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  Also s/p Bilateral GSV Ablation.   Dyspnea    Essential hypertension    History of kidney stones    Hx of adenomatous colonic polyps    Hyperlipidemia with target LDL less  than 100    Longstanding persistent atrial fibrillation ([place])    Nephrolithiasis 05/2019   CKD stage 3   Obesity    OSA on CPAP    does not cpap    Pneumonia 08/2019   Pre-diabetes    Pulmonary hypertension ([place])    Related to OSA, partial component of HFpEF   Pyelonephritis 05/2019      Past Surgical History:  Procedure Laterality Date   ABDOMINAL VENOUS DUPLEX Bilateral 07/2014   ([facility]: Dr. Cleda Mccreedy): (prior to Bilateral Iliac-Femoral Venous Stent Placement): Patent IVC.  No DVT B/L Iliac or Com fem V..  +++ DEEP V REFLUX B/L ILIAC & FEM V.  Absent Superficial V Reflux B/L x L SSV (too tortuous for intervention).  BL GSV occluded - prior ablation.    ABDOMINAL VENOUS DUPLEX Bilateral 11/2014   Absent DVT.  Left common femoral venous reflux: 1508 MS left common femoral vein, 2284MS left proximal femoral vein -> cystic structure noted in the left popliteal vein (7.6 x 1.4 x 3.8 cm).;  Right leg deep venous reflux noted in the distal iliac, common femoral, proximal profunda femoral, proximal superficial femoral, superficial femoral and popliteal vein.   CARDIAC CATHETERIZATION  10/2009   [facility] - Dr. 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  EF~45%.  Mild  pulmonary pretension.   CARDIAC PET  08/2009   Inferolateral partially reversible defect -- > false positive by cath   COLONOSCOPY     CYSTOSCOPY W/ URETERAL STENT PLACEMENT Left 01/19/2021   Procedure: CYSTOSCOPY WITH STENT EXCHANGE;  Surgeon: Ceasar Mons, MD;  Location: [facility];  Service: Urology;  Laterality: Left;  ONLY NEEDS 30 MIN   CYSTOSCOPY WITH STENT PLACEMENT Left 05/12/2021   Procedure: CYSTOSCOPY WITH LEFT STENT EXCHANGE;  Surgeon: Ceasar Mons, MD;  Location: [facility];  Service: Urology;  Laterality: Left;  ONLY NEEDS 30 MIN   CYSTOSCOPY/URETEROSCOPY/HOLMIUM LASER/STENT PLACEMENT Left 11/06/2019   Procedure: CYSTOSCOPY/RETROGRADE/URETEROSCOPY/HOLMIUM LASER/STENT PLACEMENT. REMOVAL NEPHROSCOPYTUBE;  Surgeon: Ceasar Mons, MD;  Location: [facility];  Service: Urology;  Laterality: Left;   ESOPHAGOGASTRODUODENOSCOPY     ESOPHAGOGASTRODUODENOSCOPY N/A 08/11/2021   Procedure: ESOPHAGOGASTRODUODENOSCOPY (EGD);  Surgeon: Arta Silence, MD;  Location: [facility];  Service: Endoscopy;  Laterality: N/A;   GREATER SAPHENOUS VEIN ABLATION Bilateral Before 2015   [facility] (Dr. Eugenie Filler)   IR NEPHROSTOMY EXCHANGE LEFT  08/28/2019   IR NEPHROSTOMY PLACEMENT LEFT  06/24/2019   IR TRANSCATH PLC STENT  EA ADD VEIN  INC ANGIOPLASTY Left 10/2014   ([place], Dr. 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 Stormy Fabian): US Guided --> R Common & External Iliac, Common Femoral Venography with Placement of 24 mmx 70 mm Stent - R Com Iliac-Ext Iliac- & Com Fem V. reducing 60-80% venous compression to ~0%).;; also noted significant L-sided venous compression (staged stenting).     LIPOMA EXCISION     x 2 came back again   NM MYOVIEW LTD  11/2005   Adenosine Myoview [facility] - Dr. Cleda Mccreedy): Normal myocardial perfusion scan. No ischemia or infarction.   TRANSTHORACIC ECHOCARDIOGRAM  04/26/2017    [facility] - Dr. Cleda Mccreedy) - report not available.  Per clinic note: Normal global LV function. Mild concentric temperature. Mild TR. Mild pulmonary hypertension.;;    TRANSTHORACIC ECHOCARDIOGRAM  7/'15; 7/'17   [facility] - Dr. Saverio Danker) a) low normal LV function (EF 56%). Moderate concentric LVH. Mild LA dilation. Normal pulmonary pressures.  ;; b) normal global function - EF 60-65%. Moderate pulmonary hypertension. Biatrial enlargement. Moderate concentric LVH.   TRANSTHORACIC ECHOCARDIOGRAM  02/2010   [facility] - Dr. Saverio Danker): Normal global LV function. Mild LVH. EF 60%.; November 2010. Moderate LV dilation. Moderate LVH. Normal EF 50-55%. Mild to moderate pulmonary hypertension. (July 2012 showed normal function with mild LVH, mild MR noted in August 2014)   TRANSTHORACIC ECHOCARDIOGRAM  05/2019   During admission for UTI, sepsis with A. fib RVR: LV normal size and function.  EF 60-65%.  Mildly reduced RV size with severely reduced function.  Despite this normal atrial size noted with just only mild right atrial enlargement with elevated RAP (dilated IVC).  Relatively poor TR jet, but findings do suggest elevated RV pressures.   TRANSURETHRAL RESECTION OF BLADDER TUMOR Left 10/21/2020   Procedure: TRANSURETHRAL RESECTION OF BLADDER TUMOR (TURBT)/ RETROGRADE/  URETERAL STENT PLACEMENT;  Surgeon: Ceasar Mons, MD;  Location: [facility];  Service: Urology;  Laterality: Left;    Social History:  Ambulatory wheelchair bound,       reports that he quit smoking about 26 years ago. His smoking use included cigarettes. He has a 35.00 pack-year smoking history. He has never used smokeless tobacco. He reports current alcohol use.  He reports that he does not use drugs.     Family History:    Family History  Problem Relation Age of Onset   Colon cancer Mother    Heart attack Father 82   Hypertension Brother    Cancer Sister    Colon cancer Sister    Other Neg Hx        He really does not know much about his parents and siblings health, but they did not speak about healthcare   ______________________________________________________________________________________________ Allergies: Allergies  Allergen Reactions   Aldactone [Spironolactone] Other (See Comments)    Unknown reaction      Prior to Admission medications   Medication Sig Start Date End Date Taking? Authorizing Provider  atorvastatin (LIPITOR) 10 MG tablet TAKE 1 TABLET DAILY Patient taking differently: Take 10 mg by mouth daily. 11/10/20  Yes [name], MD  docusate sodium (COLACE) 100 MG capsule Take 1 capsule (100 mg total) by mouth 2 (two) times daily. 08/13/21  Yes [name], MD  HYDROcodone-acetaminophen (NORCO/VICODIN) 5-325 MG tablet Take 1 tablet by mouth every 6 (six) hours as needed for moderate pain.   Yes [provider]  loperamide (IMODIUM A-D) 2 MG tablet Take 2-4 mg by mouth 4 (four) times daily as needed for diarrhea or loose stools.   Yes [provider]  metoprolol tartrate (LOPRESSOR) 25 MG tablet Take 25-37.5 mg by mouth See admin instructions. Take 25 mg by mouth in the morning then 37.5 (1.5 tablets) in the evening 01/08/21  Yes [provider]  pantoprazole (PROTONIX) 40 MG tablet Take 1 tablet (40 mg total) by mouth 2 (two) times daily. 08/13/21 08/13/22 Yes [name], MD  polyethylene glycol (MIRALAX / GLYCOLAX) 17 g packet Take 17 g by mouth daily.   Yes [provider]  potassium chloride SA (KLOR-CON) 20 MEQ tablet  Take 2 tablets (40 mEq total) by mouth daily. 08/13/21  Yes [name], MD  prochlorperazine (COMPAZINE) 10 MG tablet TAKE 1 TABLET(10 MG) BY MOUTH EVERY 6  HOURS AS NEEDED FOR NAUSEA OR VOMITING Patient taking differently: Take 10 mg by mouth every 6 (six) hours as needed for nausea or vomiting. 12/14/20  Yes [name], MD  tamsulosin (FLOMAX) 0.4 MG CAPS capsule Take 0.4 mg by mouth daily.   Yes [provider]  torsemide (DEMADEX) 20 MG tablet Take 4 tablets in the AM and 3 tablets in the PM alternating with 3 tablets in the AM and 2 tablets in the PM Patient taking differently: Take 40-80 mg by mouth See admin instructions. Take 4 tablets in the AM and 3 tablets in the PM alternating with 3 tablets in the AM and 2 tablets in the PM 07/15/21  Yes [name], MD  XARELTO 20 MG TABS tablet Take 1 tablet (20 mg total) by mouth daily with supper. 08/19/21  Yes [name], MD  Nystatin (GERHARDT'S BUTT CREAM) CREA Apply 1 application topically 3 (three) times daily. 08/13/21   [name], MD    ___________________________________________________________________________________________________ Physical Exam: Vitals with BMI 09/16/2021 09/16/2021 09/16/2021  Height - - -  Weight - - -  BMI - - -  Systolic [phone]  Diastolic 63 63 64  Pulse 79 73 77     1. General:  in No  Acute distress    Chronically ill  -appearing 2. Psychological: Alert and  Oriented 3. Head/ENT:    Dry Mucous Membranes                          Head Non traumatic, neck supple                           Poor Dentition 4. SKIN:  decreased Skin turgor,  Skin clean Dry and dry eschar on the left heel 5. Heart: Regular rate and rhythm no Murmur, no Rub or gallop 6. Lungs:   no wheezes or crackles   7. Abdomen: Soft,  non-tender, Non distended   obese  bowel sounds present 8. Lower extremities: no clubbing, cyanosis,   bilateral edema bilateral leg changes suggestive of chronic venous stasis   9. Neurologically Grossly intact, moving all 4 extremities equally   10. MSK: Normal range of motion    Chart has been  reviewed  ______________________________________________________________________________________________  Assessment/Plan  80 y.o. male with medical history significant of bladder Ca, atrial fibrillation and is on Xarelto.  CHF, HTN, abdominal aortic aneurysm.   left ureteral stent placed back in August.  80 y.o. male with medical history significant of bladder Ca, atrial fibrillation and is on Xarelto.  CHF, HTN, abdominal aortic aneurysm.   left ureteral stent placed back in August.   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)  Lactic acidosis  Dehydration  Diarrhea  Chronic venous insufficiency  Acute on chronic renal failure (HCC)  Pressure injury of left heel, stage 2 (HCC)  Morbid obesity (HCC)  SIRS (systemic inflammatory response syndrome) (HCC)  Chronic pain     Essential  hypertension Allow permissive hypertention given hypotension  Diarrhea Stop stool softener and miralax if persists will need further eval   Hyperlipidemia with target LDL less than 100 Continue lipitor  Lactic acidosis Rehydrate and follow lactic acid Now down with IVF fluids will continue to rehydrate Lactic Acid, Venous    Component Value Date/Time   LATICACIDVEN 3.3 (HH) 09/17/2021 0012     Acute on chronic renal failure (HCC) -  evidence of acute renal failure due to presence of following: Cr increased >0.3 from baseline likely secondary to dehydration      check FeNA       Rehydrate with IV fluids   Ct showing no obstruction       If persists despite fluid resuscitation will need renal consult.   - avoid nephrotoxic medications such as NSAIDs, Vanco  Zosyn combo,  avoid hypotension, continue to follow renal function   SIRS (systemic inflammatory response syndrome) (HCC)  RR >20 Today's Vitals   09/16/21 2347 09/17/21 0000 09/17/21 0015 09/17/21 0030  BP: 97/75 (!) 104/58 101/60 (!) 124/52  Pulse: 72 72 79 73  Resp: (!) 25 (!) 21 20 (!) 21  Temp:      TempSrc:      SpO2: 100% 100% 100% 100%  Weight:      PainSc:         SIRS criteria were due to ( dehydration, ) At this Time no source of infection and SEPSIS IS RULED OUT The patient is noted to have a MAPs <65/ SBPs <90. With the current information available to me, I don't think the patient is in septic shock. The MAPs <65/ SBPs <90, is related to hypovolemia and improved with aggressive fluid resuscitation.  The patient is noted to have a lactate>4. With the current information available to me, I don't think the patient is in septic shock. The lactate>4, is related to  dehydration,      evidence of end organ damage/organ dysfunction such as   Acute Kidney Injury with Cr > 2,  Lab Results  Component Value Date   CREATININE 3.03 (H) 09/16/2021   CREATININE 1.87 (H) 08/13/2021    elevated lactic acid >2     Component Value Date/Time   LATICACIDVEN 3.3 (HH) 09/17/2021 0012      - Obtain serial lactic acid and procalcitonin level.  - Initiated IV antibiotics in ER: Antibiotics Given (last 72 hours)     Date/Time Action Medication Dose Rate   09/16/21 2204 New Bag/Given   ceFEPIme (MAXIPIME) 2 g in sodium chloride 0.9 % 100 mL IVPB 2 g 200 mL/hr   09/16/21 2235 New Bag/Given   vancomycin (VANCOREADY) IVPB 2000 mg/400 mL 2,000 mg 200 mL/hr   09/16/21 2303 New Bag/Given   metroNIDAZOLE (FLAGYL) IVPB 500 mg 500 mg 100 mL/hr       Will continue      - await results of blood and urine culture  - Rehydrate      2:14 AM    Dehydration Will rehydrate and follow renal function   Chronic diastolic HF (heart failure) (HCC) - currently appears to be slightly on the dry  side, hold home diuretics for tonight and restart when appears euvolemic, carefuly follow fluid status and Cr   Permanent atrial fibrillation (HCC); CHA2DS2-VASc score =5 (agex2, aortic plaque, CHF, HTN) Hold eliquis for tonight  Given will need stent exchange by Urology   Hold metoprolol given hypotenion  Morbid obesity ([place]) Chronic stable follow-up nutritional consult as  an outpatient  Hydronephrosis with obstructing calculus Urology is aware Case was discussed by ER provider with Dr. Abner Greenspan at night. Will see patient in the morning.  Keep n.p.o. in case may need stent exchange  Chronic venous insufficiency Chronic stable wound care consult ordered  OSA on CPAP Has not been compliant with CPAP.  Would resume  Cancer of trigone of urinary bladder ([place]) Has been followed as an outpatient by Dr. Alen Blew we will email them to let them know patient has been admitted  Pressure injury of left heel, stage 2 ([place]) Wound care consult  Pulmonary hypertension ([place]) Chronic stable oxygen as needed  Chronic pain Resume home pain medications if blood pressure allows   Other plan as per orders.  DVT prophylaxis:  scd    Code Status:    Code Status: Prior FULL CODE  care as per patient  family  I had personally discussed CODE STATUS with patient    Family Communication:   Family not at  Bedside    Disposition Plan:                              Back to current facility when stable                            Following barriers for discharge:                            Electrolytes corrected                                                                able to transition to PO antibiotics                             Will need to be able to tolerate PO                                                       Will need consultants to evaluate patient prior to discharge                       Transition of care consulted                   Nutrition    consulted                  Wound  care  consulted                  Consults called: Urology is aware  Admission status:  ED Disposition     ED Disposition  [place]: [place] [[id number]]  Level of Care: Stepdown [14]  Admit to SDU based on following criteria: Hemodynamic compromise or significant risk of instability:  Patient requiring short term acute titration and management of vasoactive drips, and invasive monitoring (  i.e., CVP and Arterial line).  May admit patient to [facility] or [facility] if equivalent level of care is available:: No  Covid Evaluation: Asymptomatic Screening Protocol (No Symptoms)  Diagnosis: Sepsis [facility]) [[id number]]  Admitting Physician: Toy Baker [[id number]]  Attending Physician: Toy Baker [[id number]]  Estimated length of stay: past midnight tomorrow  Certification:: I certify this patient will need inpatient services for at least 2 midnights            inpatient     I Expect 2 midnight stay secondary to severity of patient's current illness need for inpatient interventions justified by the following:  hemodynamic instability despite optimal treatment ( hypotension )  Severe lab/radiological/exam abnormalities including:    Lactic acidosis and extensive comorbidities including:     CHF  CAD    CKD     malignancy,   Chronic anticoagulation  That are currently affecting medical management.   I expect  patient to be hospitalized for 2 midnights requiring inpatient medical care.  Patient is at high risk for adverse outcome (such as loss of life or disability) if not treated.  Indication for inpatient stay as follows:    inability to maintain oral hydration    Need for operative/procedural  intervention     Need for IV antibiotics, IV fluids    Level of care   stepdown tele indefinitely please discontinue once patient no longer qualifies COVID-19 Labs    Lab Results  Component Value Date    [place] 09/16/2021     Precautions: admitted as   Covid Negative      Huntleigh Doolen 09/16/2021, 2:30 AM      [facility]     after 2 AM please page floor coverage PA If 7AM-7PM, please contact the day team taking care of the patient using [email or web address]   Patient was evaluated in the context of the global COVID-19 pandemic, which necessitated consideration that the patient might be at risk for infection with the SARS-CoV-2 virus that causes COVID-19. Institutional protocols and algorithms that pertain to the evaluation of patients at risk for COVID-19 are in a state of rapid change based on information released by regulatory bodies including the CDC and federal and state organizations. These policies and algorithms were followed during the patient's care.

## 2021-09-16 NOTE — ED Provider Notes (Signed)
Emergency Medicine Provider Triage Evaluation Note  Patrick Bishop , a 80 y.o. male  was evaluated in triage.  Pt complains of weakness.  Patient states he has been in rehab after being discharged from the hospital for acute on chronic diastolic heart failure and UTI.  Per patient was on the toilet when he was unable to get back up.  EMS was called and he had blood pressure 71H systolic.  EMS gave him 500 mL bolus and blood pressure came up to 96/48.  Here the patient has repeat blood pressure of 87/63.  The patient has difficulty responding and takes a while to answer questions.  He does appear pale.  He does appear lethargic however he is awake and oriented.  Appears short of breath review blood pressure 70/30. Review of Systems  Positive: See above Negative:   Physical Exam  There were no vitals taken for this visit. Gen:   Awake, no distress   Resp:  Short of breath MSK:   Moves extremities without difficulty  Other:  S1/S2 without tachycardia.  Medical Decision Making  Medically screening exam initiated at 8:16 PM.  Appropriate orders placed.  Patrick Bishop was informed that the remainder of the evaluation will be completed by another provider, this initial triage assessment does not replace that evaluation, and the importance of remaining in the ED until their evaluation is complete.  Charge aware that the patient needs a room.   Mickie Hillier, PA-C 09/16/21 2022    Fredia Sorrow, MD 09/17/21 [phone]

## 2021-09-16 NOTE — ED Notes (Signed)
Dr. Rogene Houston informed of patient's lactic acid of 7.2 via epic secure chat

## 2021-09-16 NOTE — Sepsis Progress Note (Signed)
Following per sepsis protocol   

## 2021-09-16 NOTE — Progress Notes (Signed)
A consult was received from an ED physician for Vancomycin and Cefepime per pharmacy dosing.  The patient's profile has been reviewed for ht/wt/allergies/indication/available labs.    A one time order has been placed for Vancomycin 2gm IV and Cefepime 2gm IV.    Further antibiotics/pharmacy consults should be ordered by admitting physician if indicated.                       Thank you, Everette Rank, PharmD 09/16/2021  9:45 PM

## 2021-09-16 NOTE — ED Triage Notes (Signed)
Per EMS, pt has been back and forth from hospital and rehab facility. Pt comes in today with weakness and decreased oral intake. Pt was hypotensive when EMS arrived. Systolic 21F. 500cc bolus administered and came up to 96/48. Here pressure is 87/63. Pt slow to respond. Color pale. A+O times four

## 2021-09-16 NOTE — Subjective & Objective (Signed)
War reporting fatigue, blood pressures by EMS systolics were 12I Got 786 ml improved to 96  Patient had generalized weakness today felt lightheaded Just arrived to [facility] from [facility]hab

## 2021-09-16 NOTE — ED Provider Notes (Signed)
[place] DEPT Provider Note   CSN: [id number] Arrival date & time: 09/16/21  1953     History Chief Complaint  Patient presents with   Weakness    Patrick Bishop is a 80 y.o. male.  Patient brought in by [facility].  Patient recently got to [facility].  After being in rehab.  Patient recently admitted November 14th through November 18.  Patient has a history of atrial fibrillation and is on Xarelto.  Patient has a history of congestive heart failure.  Patient has a history of hypertension.  History of chronic kidney disease stage III.  Known to have nephrolithiasis.  Patient known to have an abdominal aortic aneurysm.  History of kidney stones.  Patient had a left ureteral stent placed back in August.  By Dr. Lovena Neighbours from urology.  Patient states that this time for that to be changed out.  Patient mentating fine.  But patient's blood pressures by EMS systolics were 62X.  He got a 500 cc bolus and it came up to 96 when he arrived here his pressure was 87 systolic.  Patient appears pale.  Patient has chronic lower extremity edema with skin changes.  Patient states he has not been eating or drinking very well.  Patient had generalized weakness today felt lightheaded.  Denies any new pain.  Patient arrived on 2 L of oxygen.  Not hypoxic on that.      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  Also s/p Bilateral GSV Ablation.   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   Pre-op evaluation 09/28/2020   Goals of care, counseling/discussion 08/27/2019   Pneumonia due to COVID-19 virus 08/05/2019   Pneumonia 08/03/2019   Suspected COVID-19 virus infection    Shortness of breath 08/02/2019   Leukopenia    Heme + stool    Decreased hemoglobin    Hydronephrosis with obstructing calculus 06/24/2019   Cholelithiasis 06/24/2019   Arthritis 07/12/2018   Chronic pain 07/12/2018   Hearing loss 07/12/2018   Lymphedema 07/12/2018   Muscle weakness 07/12/2018   Unsteady gait 07/12/2018   Morbid obesity ([place]) 07/08/2018   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: Dr. Cleda Mccreedy): (prior to Bilateral Iliac-Femoral Venous Stent Placement): Patent IVC.  No DVT B/L Iliac or Com fem V..  +++ DEEP V REFLUX B/L ILIAC & FEM V.  Absent Superficial V Reflux B/L x L SSV (too tortuous for intervention).  BL GSV occluded - prior ablation.    ABDOMINAL VENOUS DUPLEX Bilateral 11/2014   Absent DVT.  Left common femoral venous reflux: 1508 MS left common femoral vein, 2284MS left proximal femoral vein -> cystic structure noted in the left  popliteal vein (7.6 x 1.4 x 3.8 cm).;  Right leg deep venous reflux noted in the distal iliac, common femoral, proximal profunda femoral, proximal superficial femoral, superficial femoral and popliteal vein.   CARDIAC CATHETERIZATION  10/2009   [facility] Card[facility] - Dr. 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  EF~45%.  Mild pulmonary pretension.   CARDIAC PET  08/2009   Inferolateral partially reversible defect -- > false positive by cath   COLONOSCOPY     CYSTOSCOPY W/ URETERAL STENT PLACEMENT Left 01/19/2021   Procedure: CYSTOSCOPY WITH STENT EXCHANGE;  Surgeon: Ceasar Mons, MD;  Location: [facility];  Service: Urology;  Laterality: Left;  ONLY NEEDS 30 MIN   CYSTOSCOPY WITH STENT PLACEMENT Left 05/12/2021   Procedure: CYSTOSCOPY WITH LEFT STENT EXCHANGE;  Surgeon: Ceasar Mons, MD;  Location: [facility];  Service: Urology;  Laterality: Left;  ONLY NEEDS 30 MIN   CYSTOSCOPY/URETEROSCOPY/HOLMIUM LASER/STENT PLACEMENT Left 11/06/2019   Procedure: CYSTOSCOPY/RETROGRADE/URETEROSCOPY/HOLMIUM LASER/STENT PLACEMENT. REMOVAL NEPHROSCOPYTUBE;  Surgeon: Ceasar Mons, MD;  Location: [facility];  Service: Urology;  Laterality: Left;   ESOPHAGOGASTRODUODENOSCOPY     ESOPHAGOGASTRODUODENOSCOPY N/A 08/11/2021   Procedure: ESOPHAGOGASTRODUODENOSCOPY (EGD);  Surgeon: Arta Silence, MD;  Location: [facility];  Service: Endoscopy;  Laterality: N/A;   GREATER SAPHENOUS VEIN ABLATION Bilateral Before 2015   [facility] (Dr. Eugenie Filler)   IR NEPHROSTOMY EXCHANGE LEFT  08/28/2019   IR NEPHROSTOMY PLACEMENT LEFT  06/24/2019   IR TRANSCATH PLC STENT  EA ADD VEIN  INC ANGIOPLASTY Left 10/2014   ([place], Dr. 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 Stormy Fabian): US Guided --> R Common & External Iliac, Common Femoral Venography with Placement of 24 mmx 70 mm Stent - R Com Iliac-Ext Iliac- & Com Fem V. reducing 60-80% venous compression to ~0%).;; also noted significant L-sided venous compression (staged stenting).    LIPOMA EXCISION     x 2 came back again   NM MYOVIEW LTD  11/2005   Adenosine Myoview [facility] Cardiology - Dr. Cleda Mccreedy): Normal myocardial perfusion scan. No ischemia or infarction.   TRANSTHORACIC ECHOCARDIOGRAM  04/26/2017    [facility] Cardiology - Dr. Cleda Mccreedy) - report not available.  Per clinic note: Normal global LV function. Mild concentric temperature. Mild TR. Mild pulmonary hypertension.;;    TRANSTHORACIC ECHOCARDIOGRAM  7/'15; 7/'17   [facility]iology - Dr. Saverio Danker)  a) low normal LV function (EF 56%). Moderate concentric LVH. Mild LA dilation. Normal pulmonary pressures.  ;; b) normal global function - EF 60-65%. Moderate pulmonary hypertension. Biatrial enlargement. Moderate concentric LVH.   TRANSTHORACIC ECHOCARDIOGRAM  02/2010   [facility] - Dr. Saverio Danker): Normal global LV function. Mild LVH. EF 60%.; November 2010. Moderate LV dilation. Moderate LVH. Normal EF 50-55%. Mild to moderate pulmonary hypertension. (July 2012 showed normal function with mild LVH, mild MR noted in August 2014)   TRANSTHORACIC ECHOCARDIOGRAM  05/2019   During admission for UTI, sepsis with A. fib RVR: LV normal size and function.  EF 60-65%.  Mildly reduced RV size with severely reduced function.  Despite this normal atrial size noted with just only mild right atrial enlargement with elevated RAP (dilated IVC).  Relatively poor TR jet, but findings do suggest elevated RV pressures.   TRANSURETHRAL RESECTION OF BLADDER TUMOR Left 10/21/2020   Procedure: TRANSURETHRAL RESECTION OF BLADDER TUMOR (TURBT)/  RETROGRADE/  URETERAL STENT PLACEMENT;  Surgeon: Ceasar Mons, MD;  Location: [facility];  Service: Urology;  Laterality: Left;       Family History  Problem Relation Age of Onset   Colon cancer Mother    Heart attack Father 76   Hypertension Brother    Cancer Sister    Colon cancer Sister    Other Neg Hx        He really does not know much about his parents and siblings health, but they did not speak about healthcare    Social History   Tobacco Use   Smoking status: Former    Packs/day: 1.00    Years: 35.00    Pack years: 35.00    Types: Cigarettes    Quit date: 1996    Years since quitting: 26.9   Smokeless tobacco: Never   Tobacco comments:    smoked from age [age]   Vaping Use   Vaping Use: Never used  Substance Use Topics   Alcohol use: Yes    Comment: occasional   Drug use: No    Home Medications Prior to Admission medications   Medication Sig Start Date End Date Taking? Authorizing Provider  atorvastatin (LIPITOR) 10 MG tablet TAKE 1 TABLET DAILY Patient taking differently: Take 10 mg by mouth daily. 11/10/20   [name], MD  docusate sodium (COLACE) 100 MG capsule Take 1 capsule (100 mg total) by mouth 2 (two) times daily. 08/13/21   [name], MD  loperamide (IMODIUM A-D) 2 MG tablet Take 2-4 mg by mouth 4 (four) times daily as needed for diarrhea or loose stools.    [provider]  metoprolol tartrate (LOPRESSOR) 25 MG tablet Take 25-37.5 mg by mouth See admin instructions. Take 25 mg in the morning and 37.5 mg in the evening 01/08/21   [provider]  Nystatin (GERHARDT'S BUTT CREAM) CREA Apply 1 application topically 3 (three) times daily. 08/13/21   [name], MD  pantoprazole (PROTONIX) 40 MG tablet Take 1 tablet (40 mg total) by mouth 2 (two) times daily. 08/13/21 08/13/22  [name], MD  potassium chloride SA (KLOR-CON) 20 MEQ tablet Take 2 tablets (40 mEq total) by mouth daily. 08/13/21   [name], MD   prochlorperazine (COMPAZINE) 10 MG tablet TAKE 1 TABLET(10 MG) BY MOUTH EVERY 6 HOURS AS NEEDED FOR NAUSEA OR VOMITING Patient taking differently: Take 10 mg by mouth every 6 (six) hours as needed for nausea or vomiting. 12/14/20   Zola Button  N, MD  tamsulosin (FLOMAX) 0.4 MG CAPS capsule Take 0.4 mg by mouth daily.    [provider]  torsemide (DEMADEX) 20 MG tablet Take 4 tablets in the AM and 3 tablets in the PM alternating with 3 tablets in the AM and 2 tablets in the PM Patient taking differently: Take 40-80 mg by mouth See admin instructions. Take 4 tablets in the AM and 3 tablets in the PM alternating with 3 tablets in the AM and 2 tablets in the PM 07/15/21   [name], MD  XARELTO 20 MG TABS tablet Take 1 tablet (20 mg total) by mouth daily with supper. 08/19/21   [name], MD    Allergies    Aldactone [spironolactone]  Review of Systems   Review of Systems  Constitutional:  Positive for appetite change and fatigue. Negative for chills and fever.  HENT:  Negative for ear pain and sore throat.   Eyes:  Negative for pain and visual disturbance.  Respiratory:  Negative for cough and shortness of breath.   Cardiovascular:  Negative for chest pain and palpitations.  Gastrointestinal:  Negative for abdominal pain and vomiting.  Genitourinary:  Negative for dysuria and hematuria.  Musculoskeletal:  Negative for arthralgias and back pain.  Skin:  Negative for color change and rash.  Neurological:  Positive for light-headedness. Negative for seizures and syncope.  All other systems reviewed and are negative.  Physical Exam Updated Vital Signs BP 102/66 (BP Location: Right Arm)    Pulse 80    Temp 98.8 F (37.1 C) (Oral)    Resp 12    Wt 108.9 kg    SpO2 100%    BMI 31.66 kg/m   Physical Exam Vitals and nursing note reviewed.  Constitutional:      General: He is not in acute distress.    Appearance: He is well-developed.  HENT:     Head: Normocephalic and  atraumatic.     Mouth/Throat:     Mouth: Mucous membranes are dry.  Eyes:     Conjunctiva/sclera: Conjunctivae normal.  Cardiovascular:     Rate and Rhythm: Normal rate and regular rhythm.     Heart sounds: No murmur heard. Pulmonary:     Effort: Pulmonary effort is normal. No respiratory distress.     Breath sounds: Normal breath sounds.  Abdominal:     General: There is no distension.     Palpations: Abdomen is soft.     Tenderness: There is no abdominal tenderness.  Musculoskeletal:        General: Swelling present.     Cervical back: Normal range of motion and neck supple.     Right lower leg: Edema present.     Left lower leg: Edema present.     Comments: With chronic skin changes.  Skin:    General: Skin is warm and dry.     Capillary Refill: Capillary refill takes less than 2 seconds.  Neurological:     General: No focal deficit present.     Mental Status: He is alert and oriented to person, place, and time.  Psychiatric:        Mood and Affect: Mood normal.    ED Results / Procedures / Treatments   Labs (all labs ordered are listed, but only abnormal results are displayed) Labs Reviewed  COMPREHENSIVE METABOLIC PANEL - Abnormal; Notable for the following components:      Result Value   CO2 18 (*)    Glucose, Bld  253 (*)    BUN 38 (*)    Creatinine, Ser 3.03 (*)    Calcium 8.7 (*)    Albumin 3.3 (*)    GFR, Estimated 20 (*)    Anion gap 17 (*)    All other components within normal limits  CBC WITH DIFFERENTIAL/PLATELET - Abnormal; Notable for the following components:   RBC 3.77 (*)    Hemoglobin 11.2 (*)    HCT 35.9 (*)    RDW 15.7 (*)    Neutro Abs 8.0 (*)    Lymphs Abs 0.4 (*)    Abs Immature Granulocytes 0.15 (*)    All other components within normal limits  LACTIC ACID, PLASMA - Abnormal; Notable for the following components:   Lactic Acid, Venous 7.2 (*)    All other components within normal limits  CBG MONITORING, ED - Abnormal; Notable for the  following components:   Glucose-Capillary 231 (*)    All other components within normal limits  RESP PANEL BY RT-PCR (FLU A&B, COVID) ARPGX2  CULTURE, BLOOD (ROUTINE X 2)  CULTURE, BLOOD (ROUTINE X 2)  URINE CULTURE  BRAIN NATRIURETIC PEPTIDE  URINALYSIS, ROUTINE W REFLEX MICROSCOPIC  PROTIME-INR  APTT  TROPONIN I (HIGH SENSITIVITY)  TROPONIN I (HIGH SENSITIVITY)    EKG None  Radiology DG Chest Port 1 View  Result Date: 09/16/2021 CLINICAL DATA:  Weakness and decreased oral intake. EXAM: PORTABLE CHEST 1 VIEW COMPARISON:  None. FINDINGS: Mild, stable linear atelectasis is seen within the right lung base. There is stable elevation of the right hemidiaphragm. There is no evidence of a pleural effusion or pneumothorax. The heart size and mediastinal contours are within normal limits. The visualized skeletal structures are unremarkable. IMPRESSION: Mild, stable right basilar linear atelectasis. Electronically Signed   By: Virgina Norfolk M.D.   On: 09/16/2021 21:18    Procedures Procedures   Medications Ordered in ED Medications  0.9 %  sodium chloride infusion (has no administration in time range)  lactated ringers infusion (has no administration in time range)  lactated ringers bolus 1,000 mL (has no administration in time range)    And  lactated ringers bolus 1,000 mL (has no administration in time range)    And  lactated ringers bolus 1,000 mL (has no administration in time range)    And  lactated ringers bolus 500 mL (has no administration in time range)  ceFEPIme (MAXIPIME) 2 g in sodium chloride 0.9 % 100 mL IVPB (has no administration in time range)  metroNIDAZOLE (FLAGYL) IVPB 500 mg (has no administration in time range)  vancomycin (VANCOREADY) IVPB 2000 mg/400 mL (has no administration in time range)  sodium chloride 0.9 % bolus 500 mL (500 mLs Intravenous New Bag/Given 09/16/21 2041)    ED Course  I have reviewed the triage vital signs and the nursing  notes.  Pertinent labs & imaging results that were available during my care of the patient were reviewed by me and considered in my medical decision making (see chart for details).  Clinical Course as of 09/16/21 2146  Thu Sep 16, 2021  2041 ED EKG [SZ]  2042 ED EKG [SZ]    Clinical Course User Index [SZ] Fredia Sorrow, MD   MDM Rules/Calculators/A&P                          CRITICAL CARE Performed by: Fredia Sorrow Total critical care time: 60 minutes Critical care time was exclusive of separately billable procedures  and treating other patients. Critical care was necessary to treat or prevent imminent or life-threatening deterioration. Critical care was time spent personally by me on the following activities: development of treatment plan with patient and/or surrogate as well as nursing, discussions with consultants, evaluation of patient's response to treatment, examination of patient, obtaining history from patient or surrogate, ordering and performing treatments and interventions, ordering and review of laboratory studies, ordering and review of radiographic studies, pulse oximetry and re-evaluation of patient's condition.  Because of the hypotension.  Patient received 500 cc bolus.  Chest x-ray showed no significant pulmonary edema despite his history of congestive heart failure.  Patient does not have a significant leukocytosis.  However patient's lactic acid was 7.  Based on this patient started on sepsis protocol.  Patient's BUN and creatinine is also consistent with acute kidney injury probably prerenal.  GFR was 20.  BUN 38 creatinine 3.03 which is much worse than usual.  Anion gap is 17.  Liver function test are fine.  Blood sugar 253 CO2 18.  COVID testing influenza testing negative.  Hemoglobin 11.2.  Patient's BNP very reassuring at 83.  Addition to the fluids for the sepsis protocol.  Patient started on broad-spectrum antibiotics.  Urinalysis still pending.  CT scan was  done which shows the left stent.  No complicating factors.  Discussed with Dr. Abner Greenspan on-call for urology.  They will see him in the morning.  Did not see any need for any intervention stent wise here tonight.  Patient will need the stent changed out once there is no concerns for infection.  Discussed with the hospitalist. His vital signs are now stable.  His systolic blood pressures have been consistently now 100-110.  Final Clinical Impression(s) / ED Diagnoses Final diagnoses:  Hypotension, unspecified hypotension type  Sepsis, due to unspecified organism, unspecified whether acute organ dysfunction present [facility])  AKI (acute kidney injury) [facility])    Rx / DC Orders ED Discharge Orders     None        Fredia Sorrow, MD 09/16/21 [phone]

## 2021-09-16 NOTE — ED Notes (Signed)
Dr. Zackowski at bedside  

## 2021-09-16 NOTE — ED Notes (Signed)
Pt with CT. 

## 2021-09-17 DIAGNOSIS — N179 Acute kidney failure, unspecified: Secondary | ICD-10-CM | POA: Diagnosis present

## 2021-09-17 DIAGNOSIS — Z7401 Bed confinement status: Secondary | ICD-10-CM | POA: Diagnosis not present

## 2021-09-17 DIAGNOSIS — D638 Anemia in other chronic diseases classified elsewhere: Secondary | ICD-10-CM | POA: Diagnosis present

## 2021-09-17 DIAGNOSIS — L89622 Pressure ulcer of left heel, stage 2: Secondary | ICD-10-CM | POA: Diagnosis present

## 2021-09-17 DIAGNOSIS — N3000 Acute cystitis without hematuria: Secondary | ICD-10-CM | POA: Diagnosis not present

## 2021-09-17 DIAGNOSIS — E861 Hypovolemia: Secondary | ICD-10-CM | POA: Diagnosis present

## 2021-09-17 DIAGNOSIS — I7143 Infrarenal abdominal aortic aneurysm, without rupture: Secondary | ICD-10-CM | POA: Diagnosis present

## 2021-09-17 DIAGNOSIS — R159 Full incontinence of feces: Secondary | ICD-10-CM | POA: Diagnosis present

## 2021-09-17 DIAGNOSIS — R197 Diarrhea, unspecified: Secondary | ICD-10-CM | POA: Insufficient documentation

## 2021-09-17 DIAGNOSIS — Z743 Need for continuous supervision: Secondary | ICD-10-CM | POA: Diagnosis not present

## 2021-09-17 DIAGNOSIS — E872 Acidosis, unspecified: Secondary | ICD-10-CM | POA: Insufficient documentation

## 2021-09-17 DIAGNOSIS — C670 Malignant neoplasm of trigone of bladder: Secondary | ICD-10-CM | POA: Diagnosis present

## 2021-09-17 DIAGNOSIS — A419 Sepsis, unspecified organism: Secondary | ICD-10-CM | POA: Insufficient documentation

## 2021-09-17 DIAGNOSIS — R6510 Systemic inflammatory response syndrome (SIRS) of non-infectious origin without acute organ dysfunction: Secondary | ICD-10-CM | POA: Diagnosis present

## 2021-09-17 DIAGNOSIS — R531 Weakness: Secondary | ICD-10-CM | POA: Diagnosis not present

## 2021-09-17 DIAGNOSIS — Z20822 Contact with and (suspected) exposure to covid-19: Secondary | ICD-10-CM | POA: Diagnosis present

## 2021-09-17 DIAGNOSIS — I4821 Permanent atrial fibrillation: Secondary | ICD-10-CM | POA: Diagnosis present

## 2021-09-17 DIAGNOSIS — N178 Other acute kidney failure: Secondary | ICD-10-CM | POA: Diagnosis not present

## 2021-09-17 DIAGNOSIS — I5032 Chronic diastolic (congestive) heart failure: Secondary | ICD-10-CM | POA: Diagnosis present

## 2021-09-17 DIAGNOSIS — E785 Hyperlipidemia, unspecified: Secondary | ICD-10-CM | POA: Diagnosis present

## 2021-09-17 DIAGNOSIS — I272 Pulmonary hypertension, unspecified: Secondary | ICD-10-CM | POA: Diagnosis present

## 2021-09-17 DIAGNOSIS — N390 Urinary tract infection, site not specified: Secondary | ICD-10-CM | POA: Diagnosis present

## 2021-09-17 DIAGNOSIS — J9611 Chronic respiratory failure with hypoxia: Secondary | ICD-10-CM | POA: Diagnosis present

## 2021-09-17 DIAGNOSIS — I130 Hypertensive heart and chronic kidney disease with heart failure and stage 1 through stage 4 chronic kidney disease, or unspecified chronic kidney disease: Secondary | ICD-10-CM | POA: Diagnosis present

## 2021-09-17 DIAGNOSIS — E860 Dehydration: Secondary | ICD-10-CM | POA: Insufficient documentation

## 2021-09-17 DIAGNOSIS — G894 Chronic pain syndrome: Secondary | ICD-10-CM | POA: Diagnosis not present

## 2021-09-17 DIAGNOSIS — R41841 Cognitive communication deficit: Secondary | ICD-10-CM | POA: Diagnosis not present

## 2021-09-17 DIAGNOSIS — I10 Essential (primary) hypertension: Secondary | ICD-10-CM | POA: Diagnosis not present

## 2021-09-17 DIAGNOSIS — R32 Unspecified urinary incontinence: Secondary | ICD-10-CM | POA: Diagnosis present

## 2021-09-17 DIAGNOSIS — R2689 Other abnormalities of gait and mobility: Secondary | ICD-10-CM | POA: Diagnosis not present

## 2021-09-17 DIAGNOSIS — N189 Chronic kidney disease, unspecified: Secondary | ICD-10-CM | POA: Diagnosis present

## 2021-09-17 DIAGNOSIS — M6281 Muscle weakness (generalized): Secondary | ICD-10-CM | POA: Diagnosis not present

## 2021-09-17 DIAGNOSIS — M6259 Muscle wasting and atrophy, not elsewhere classified, multiple sites: Secondary | ICD-10-CM | POA: Diagnosis not present

## 2021-09-17 DIAGNOSIS — N1832 Chronic kidney disease, stage 3b: Secondary | ICD-10-CM | POA: Diagnosis present

## 2021-09-17 DIAGNOSIS — G8929 Other chronic pain: Secondary | ICD-10-CM | POA: Diagnosis not present

## 2021-09-17 DIAGNOSIS — G4733 Obstructive sleep apnea (adult) (pediatric): Secondary | ICD-10-CM | POA: Diagnosis present

## 2021-09-17 DIAGNOSIS — N400 Enlarged prostate without lower urinary tract symptoms: Secondary | ICD-10-CM | POA: Diagnosis present

## 2021-09-17 DIAGNOSIS — Z23 Encounter for immunization: Secondary | ICD-10-CM | POA: Diagnosis present

## 2021-09-17 LAB — URINALYSIS, ROUTINE W REFLEX MICROSCOPIC
Bilirubin Urine: NEGATIVE
Glucose, UA: NEGATIVE mg/dL
Ketones, ur: NEGATIVE mg/dL
Nitrite: NEGATIVE
Protein, ur: 30 mg/dL — AB
Specific Gravity, Urine: 1.02 (ref 1.005–1.030)
WBC, UA: >50 WBC/hpf — ABNORMAL HIGH (ref 0–5)
pH: 6 (ref 5.0–8.0)

## 2021-09-17 LAB — COMPREHENSIVE METABOLIC PANEL
ALT: 11 U/L (ref 0–44)
AST: 18 U/L (ref 15–41)
Albumin: 2.7 g/dL — ABNORMAL LOW (ref 3.5–5.0)
Alkaline Phosphatase: 60 U/L (ref 38–126)
Anion gap: 10 (ref 5–15)
BUN: 36 mg/dL — ABNORMAL HIGH (ref 8–23)
CO2: 22 mmol/L (ref 22–32)
Calcium: 8.1 mg/dL — ABNORMAL LOW (ref 8.9–10.3)
Chloride: 105 mmol/L (ref 98–111)
Creatinine, Ser: 2.45 mg/dL — ABNORMAL HIGH (ref 0.61–1.24)
GFR, Estimated: 26 mL/min — ABNORMAL LOW (ref >60)
Glucose, Bld: 101 mg/dL — ABNORMAL HIGH (ref 70–99)
Potassium: 4.2 mmol/L (ref 3.5–5.1)
Sodium: 137 mmol/L (ref 135–145)
Total Bilirubin: 1 mg/dL (ref 0.3–1.2)
Total Protein: 5.9 g/dL — ABNORMAL LOW (ref 6.5–8.1)

## 2021-09-17 LAB — TSH: TSH: 1.638 u[IU]/mL (ref 0.350–4.500)

## 2021-09-17 LAB — LACTIC ACID, PLASMA
Lactic Acid, Venous: 3.3 mmol/L (ref 0.5–1.9)
Lactic Acid, Venous: 2.8 mmol/L (ref 0.5–1.9)
Lactic Acid, Venous: 1.3 mmol/L (ref 0.5–1.9)

## 2021-09-17 LAB — HEMOGLOBIN A1C
Hgb A1c MFr Bld: 5.4 % (ref 4.8–5.6)
Mean Plasma Glucose: 108.28 mg/dL

## 2021-09-17 LAB — GLUCOSE, CAPILLARY
Glucose-Capillary: 103 mg/dL — ABNORMAL HIGH (ref 70–99)
Glucose-Capillary: 96 mg/dL (ref 70–99)
Glucose-Capillary: 100 mg/dL — ABNORMAL HIGH (ref 70–99)
Glucose-Capillary: 156 mg/dL — ABNORMAL HIGH (ref 70–99)
Glucose-Capillary: 73 mg/dL (ref 70–99)
Glucose-Capillary: 104 mg/dL — ABNORMAL HIGH (ref 70–99)

## 2021-09-17 LAB — CBC WITH DIFFERENTIAL/PLATELET
Abs Immature Granulocytes: 0.07 10*3/uL (ref 0.00–0.07)
Basophils Absolute: 0 10*3/uL (ref 0.0–0.1)
Basophils Relative: 0 %
Eosinophils Absolute: 0 10*3/uL (ref 0.0–0.5)
Eosinophils Relative: 0 %
HCT: 29.1 % — ABNORMAL LOW (ref 39.0–52.0)
Hemoglobin: 9.2 g/dL — ABNORMAL LOW (ref 13.0–17.0)
Immature Granulocytes: 1 %
Lymphocytes Relative: 9 %
Lymphs Abs: 0.5 10*3/uL — ABNORMAL LOW (ref 0.7–4.0)
MCH: 29.2 pg (ref 26.0–34.0)
MCHC: 31.6 g/dL (ref 30.0–36.0)
MCV: 92.4 fL (ref 80.0–100.0)
Monocytes Absolute: 0.4 10*3/uL (ref 0.1–1.0)
Monocytes Relative: 7 %
Neutro Abs: 5 10*3/uL (ref 1.7–7.7)
Neutrophils Relative %: 83 %
Platelets: 228 10*3/uL (ref 150–400)
RBC: 3.15 MIL/uL — ABNORMAL LOW (ref 4.22–5.81)
RDW: 15.7 % — ABNORMAL HIGH (ref 11.5–15.5)
WBC: 6 10*3/uL (ref 4.0–10.5)
nRBC: 0 % (ref 0.0–0.2)

## 2021-09-17 LAB — HEPATIC FUNCTION PANEL
ALT: 10 U/L (ref 0–44)
AST: 19 U/L (ref 15–41)
Albumin: 2.5 g/dL — ABNORMAL LOW (ref 3.5–5.0)
Alkaline Phosphatase: 60 U/L (ref 38–126)
Bilirubin, Direct: 0.3 mg/dL — ABNORMAL HIGH (ref 0.0–0.2)
Indirect Bilirubin: 0.7 mg/dL (ref 0.3–0.9)
Total Bilirubin: 1 mg/dL (ref 0.3–1.2)
Total Protein: 5.8 g/dL — ABNORMAL LOW (ref 6.5–8.1)

## 2021-09-17 LAB — CREATININE, URINE, RANDOM: Creatinine, Urine: 96.53 mg/dL

## 2021-09-17 LAB — OSMOLALITY, URINE: Osmolality, Ur: 393 mOsm/kg (ref 300–900)

## 2021-09-17 LAB — TROPONIN I (HIGH SENSITIVITY): Troponin I (High Sensitivity): 27 ng/L — ABNORMAL HIGH (ref <18)

## 2021-09-17 LAB — MRSA NEXT GEN BY PCR, NASAL: MRSA by PCR Next Gen: NOT DETECTED

## 2021-09-17 LAB — MAGNESIUM: Magnesium: 1.9 mg/dL (ref 1.7–2.4)

## 2021-09-17 LAB — PHOSPHORUS: Phosphorus: 3.6 mg/dL (ref 2.5–4.6)

## 2021-09-17 LAB — SODIUM, URINE, RANDOM: Sodium, Ur: 18 mmol/L

## 2021-09-17 LAB — CK: Total CK: 306 U/L (ref 49–397)

## 2021-09-17 LAB — PROCALCITONIN: Procalcitonin: 0.4 ng/mL

## 2021-09-17 MED ORDER — RIVAROXABAN 20 MG PO TABS
20.0000 mg | ORAL_TABLET | Freq: Every day | ORAL | Status: DC
Start: 2021-09-17 — End: 2021-09-17

## 2021-09-17 MED ORDER — SODIUM CHLORIDE 0.9 % IV BOLUS: 500.0000 mL | Freq: Once | INTRAVENOUS | Status: DC

## 2021-09-17 MED ORDER — METRONIDAZOLE 500 MG/100ML IV SOLN: 500.0000 mg | Freq: Two times a day (BID) | INTRAVENOUS | Status: DC

## 2021-09-17 MED ORDER — HYDROCODONE-ACETAMINOPHEN 5-325 MG PO TABS
1.0000 | ORAL_TABLET | ORAL | Status: DC | PRN
  Administered 2021-09-17 – 2021-09-21 (×4): 2 via ORAL
  Filled 2021-09-17 (×5): qty 2

## 2021-09-17 MED ORDER — INFLUENZA VAC A&B SA ADJ QUAD 0.5 ML IM PRSY
0.5000 mL | PREFILLED_SYRINGE | INTRAMUSCULAR | Status: AC
  Administered 2021-09-20: 13:00:00 0.5 mL via INTRAMUSCULAR
  Filled 2021-09-17 (×2): qty 0.5

## 2021-09-17 MED ORDER — VANCOMYCIN HCL 1250 MG/250ML IV SOLN: 1250.0000 mg | INTRAVENOUS | Status: DC

## 2021-09-17 MED ORDER — INSULIN ASPART 100 UNIT/ML IJ SOLN
0.0000 [IU] | INTRAMUSCULAR | Status: DC
  Administered 2021-09-17: 14:00:00 2 [IU] via SUBCUTANEOUS
  Administered 2021-09-18 – 2021-09-21 (×8): 1 [IU] via SUBCUTANEOUS
  Filled 2021-09-17: qty 0.09

## 2021-09-17 MED ORDER — CHLORHEXIDINE GLUCONATE 0.12 % MT SOLN
15.0000 mL | Freq: Two times a day (BID) | OROMUCOSAL | Status: DC
  Administered 2021-09-17 (×2): 15 mL via OROMUCOSAL
  Filled 2021-09-17 (×2): qty 15

## 2021-09-17 MED ORDER — SODIUM CHLORIDE 0.9 % IV BOLUS
500.0000 mL | Freq: Once | INTRAVENOUS | Status: AC
  Administered 2021-09-17: 13:00:00 500 mL via INTRAVENOUS

## 2021-09-17 MED ORDER — ATORVASTATIN CALCIUM 10 MG PO TABS
10.0000 mg | ORAL_TABLET | Freq: Every day | ORAL | Status: DC
  Administered 2021-09-17 – 2021-09-21 (×5): 10 mg via ORAL
  Filled 2021-09-17 (×5): qty 1

## 2021-09-17 MED ORDER — ADULT MULTIVITAMIN W/MINERALS CH
1.0000 | ORAL_TABLET | Freq: Every day | ORAL | Status: DC
Start: 2021-09-17 — End: 2021-09-21
  Administered 2021-09-17 – 2021-09-21 (×5): 1 via ORAL
  Filled 2021-09-17 (×5): qty 1

## 2021-09-17 MED ORDER — SODIUM CHLORIDE 0.9 % IV SOLN: INTRAVENOUS | Status: DC

## 2021-09-17 MED ORDER — SODIUM CHLORIDE 0.9 % IV SOLN
75.0000 mL/h | INTRAVENOUS | Status: DC
  Administered 2021-09-17: 09:00:00 75 mL/h via INTRAVENOUS

## 2021-09-17 MED ORDER — SODIUM CHLORIDE 0.9 % IV SOLN
2.0000 g | INTRAVENOUS | Status: DC
Start: 2021-09-17 — End: 2021-09-18
  Administered 2021-09-17: 21:00:00 2 g via INTRAVENOUS
  Filled 2021-09-17: qty 2

## 2021-09-17 MED ORDER — ACETAMINOPHEN 650 MG RE SUPP: 650.0000 mg | Freq: Four times a day (QID) | RECTAL | Status: DC | PRN

## 2021-09-17 MED ORDER — SODIUM CHLORIDE 0.9 % IV BOLUS
1000.0000 mL | Freq: Once | INTRAVENOUS | Status: AC
  Administered 2021-09-17: 17:00:00 1000 mL via INTRAVENOUS

## 2021-09-17 MED ORDER — ORAL CARE MOUTH RINSE
15.0000 mL | Freq: Two times a day (BID) | OROMUCOSAL | Status: DC
  Administered 2021-09-17 (×2): 15 mL via OROMUCOSAL

## 2021-09-17 MED ORDER — ALBUMIN HUMAN 5 % IV SOLN
12.5000 g | Freq: Once | INTRAVENOUS | Status: AC
  Administered 2021-09-17: 23:00:00 12.5 g via INTRAVENOUS
  Filled 2021-09-17: qty 250

## 2021-09-17 MED ORDER — CHLORHEXIDINE GLUCONATE CLOTH 2 % EX PADS
6.0000 | MEDICATED_PAD | Freq: Every day | CUTANEOUS | Status: DC
  Administered 2021-09-17 – 2021-09-18 (×2): 6 via TOPICAL

## 2021-09-17 MED ORDER — ENSURE ENLIVE PO LIQD
237.0000 mL | Freq: Three times a day (TID) | ORAL | Status: DC
Start: 2021-09-17 — End: 2021-09-21
  Administered 2021-09-17 – 2021-09-21 (×10): 237 mL via ORAL

## 2021-09-17 MED ORDER — ACETAMINOPHEN 325 MG PO TABS: 650.0000 mg | ORAL_TABLET | Freq: Four times a day (QID) | ORAL | Status: DC | PRN

## 2021-09-17 NOTE — Assessment & Plan Note (Addendum)
Weaned off O2

## 2021-09-17 NOTE — Assessment & Plan Note (Signed)
Will rehydrate and follow renal function

## 2021-09-17 NOTE — Assessment & Plan Note (Signed)
-  Continue Vicodin

## 2021-09-17 NOTE — Care Plan (Signed)
Called daughter, no answer.  Called to significant other [place], spoke with her, update given.

## 2021-09-17 NOTE — Assessment & Plan Note (Addendum)
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 Imaging here shows no evidence of progression. -Follow-up with urology as an outpatient

## 2021-09-17 NOTE — Assessment & Plan Note (Signed)
Allow permissive hypertention given hypotension

## 2021-09-17 NOTE — Assessment & Plan Note (Signed)
RR >20 Today's Vitals   09/16/21 2347 09/17/21 0000 09/17/21 0015 09/17/21 0030  BP: 97/75 (!) 104/58 101/60 (!) 124/52  Pulse: 72 72 79 73  Resp: (!) 25 (!) 21 20 (!) 21  Temp:      TempSrc:      SpO2: 100% 100% 100% 100%  Weight:      PainSc:         SIRS criteria were due to ( dehydration, ) At this Time no source of infection and SEPSIS IS RULED OUT The patient is noted to have a MAPs <65/ SBPs <90. With the current information available to me, I don't think the patient is in septic shock. The MAPs <65/ SBPs <90, is related to hypovolemia and improved with aggressive fluid resuscitation.  The patient is noted to have a lactate>4. With the current information available to me, I don't think the patient is in septic shock. The lactate>4, is related to  dehydration,      evidence of end organ damage/organ dysfunction such as   Acute Kidney Injury with Cr > 2,  Lab Results  Component Value Date   CREATININE 3.03 (H) 09/16/2021   CREATININE 1.87 (H) 08/13/2021    elevated lactic acid >2     Component Value Date/Time   LATICACIDVEN 3.3 (HH) 09/17/2021 0012      - Obtain serial lactic acid and procalcitonin level.  - Initiated IV antibiotics in ER: Antibiotics Given (last 72 hours)    Date/Time Action Medication Dose Rate   09/16/21 2204 New Bag/Given   ceFEPIme (MAXIPIME) 2 g in sodium chloride 0.9 % 100 mL IVPB 2 g 200 mL/hr   09/16/21 2235 New Bag/Given   vancomycin (VANCOREADY) IVPB 2000 mg/400 mL 2,000 mg 200 mL/hr   09/16/21 2303 New Bag/Given   metroNIDAZOLE (FLAGYL) IVPB 500 mg 500 mg 100 mL/hr      Will continue      - await results of blood and urine culture  - Rehydrate      2:14 AM

## 2021-09-17 NOTE — Assessment & Plan Note (Addendum)
Cr 3.03 on admission, trended down to 1.5 now with fluids, stable at 1.5 since yesterday.   - Trend Cr - Avoid nephrotoxins - Resume home torsemide at lower dose

## 2021-09-17 NOTE — Assessment & Plan Note (Signed)
Has been followed as an outpatient by Dr. Alen Blew we will email them to let them know patient has been admitted

## 2021-09-17 NOTE — Assessment & Plan Note (Signed)
Continue statin. 

## 2021-09-17 NOTE — Assessment & Plan Note (Signed)
Chronic stable oxygen as needed

## 2021-09-17 NOTE — Progress Notes (Signed)
Progress Note   Patient: Patrick Bishop JXB:[id number] DOB: 05/04/1941 DOA: 09/16/2021     0 DOS: the patient was seen and examined on 09/17/2021   Brief hospital course: Patrick Bishop is an 80 y.o. 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  In the ER, lactate 7.2 and BP 70s but WBC normal, no fever.  CXR clear, CT abdomen showed left ureteral stent, otherwise mostly unremarkable.  BP initially 70s with EMS, only up to 90s with fluids.  Cr 3.0 mg/dL from baseline 1.9  Assessment and Plan Acute on chronic renal failure ([place])- (present on admission) Cr down to 2.4.  CT without evidence of obstruction.  Suspect this is prerenal. -Continue IV fluids - Trend Cr - Avoid nephrotoxins -Hold torsemide  SIRS (systemic inflammatory response syndrome) ([place])- (present on admission) Not clear if this is sepsis, but I have to suspect it is.  Would be septic shock.  Got fluids and repeat lactates in the ER.  Source unclear but odds are urine (given stent, recent history, instrumentation, and reports of incontinence)  -Continue cefepime - D/c vanc, flagyl   Chronic diastolic HF (heart failure) ([place])- (present on admission) Appears dehydrated. -Hold torsemide, metoprolol  Pulmonary hypertension ([place])- (present on admission) Not on O2 at home.  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 Imaging here shows no evidence of progression. - Consult urology for stent exchange - NPO - Hold  Xarelto til after stent  Morbid obesity ([place])- (present on admission) BMI>35  OSA on CPAP CPAP noncompliant  Essential hypertension- (present on admission) BP still soft -Hold torsemide, metop  Pressure injury of left heel, stage 2 ([place])- (present on admission) - WOC  Hyperlipidemia with target LDL less than 100- (present on admission) -Continue statin     Subjective: Patient is feeling better.  He has no confusion this morning.  He is feeling generally tired, but no headache, chest pain, cough, fever.  His urinary incontinence has resolved overnight.  He is making urine.  No abdominal pain.  Objective Signs reviewed and remarkable for blood pressure still soft, heart rate controlled, pulse ox normal General appearance: Elderly adult male, lying in bed, nasal cannula in place, interactive, makes eye contact     HEENT: Anicteric, conjunctival pink, lids and lashes normal.  No nasal deformity, discharge, epistaxis Skin: No suspicious rashes or lesions.  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, present on arrival Cardiac: Irregularly irregular, no systolic murmurs,, no lower extremity edema, can see good wrinkles lines on the legs Respiratory: Respiratory rate and rhythm, lungs clear without rales or wheezes Abdomen: Abdomen soft Otterness palpation or guarding, no ascites or distention MSK:  Neuro: Extraocular movements intact, moves upper extremities with generalized weakness but symmetric strength, face symmetric, speech Psych: Attention normal, affect normal, judgment insight appear normal, oriented to person, place, and time   Data Reviewed: Creatinine down to 2.45, LFTs normal, electrolytes normal, hemoglobin down to 9.2, likely delusional,  urinalysis with many leukocytes, Fina .3%  Family Communication:    Disposition: Status is: Inpatient  Remains inpatient appropriate because: Patient's  blood pressure still with a MAP of 66, he is still unable to stand up or ambulate, and still requires IV fluids and antibiotics will be treated infection empirically          Author: [name] 09/17/2021 8:01 AM  For on call review [email or web address].

## 2021-09-17 NOTE — Assessment & Plan Note (Signed)
CPAP noncompliant

## 2021-09-17 NOTE — Assessment & Plan Note (Addendum)
Initially dehydrated, now improved -Hold metoprolol - Resume torsemide

## 2021-09-17 NOTE — Assessment & Plan Note (Addendum)
Rehydrate and follow lactic acid Now down with IVF fluids will continue to rehydrate Lactic Acid, Venous    Component Value Date/Time   LATICACIDVEN 3.3 (HH) 09/17/2021 0012

## 2021-09-17 NOTE — Progress Notes (Signed)
Pt refusing CPAP QHS at this time.  

## 2021-09-17 NOTE — Assessment & Plan Note (Signed)
Urology is aware Case was discussed by ER provider with Dr. Abner Greenspan at night. Will see patient in the morning.  Keep n.p.o. in case may need stent exchange

## 2021-09-17 NOTE — Assessment & Plan Note (Deleted)
-   Wean oxygen

## 2021-09-17 NOTE — Consult Note (Signed)
Urology Consult   Physician requesting consult: Toy Baker, MD  Reason for consult: UTI  History of Present Illness: Patrick Bishop is a 80 y.o. with a history of CHF, A. fib, PVD, lower extremity lymphedema, hypertension, hyperlipidemia, obesity and OSA.  He was found to have muscle invasive bladder cancer in 09/2020 and underwent chemo/radiation as primary treatment. He follows with Dr. Lovena Neighbours.  The bladder cancer involve the left ureteral orifice and he underwent stent placement initially in 10/21/2020.  Stent was exchanged on 01/19/2021 and on 05/12/2021.  He was due to follow-up in 07/2021 to plan his next stent exchange however was admitted to hospital with UTI that resolved.  Patient presented to the ED from his rehab facility last night with fatigue, generalized weakness and lightheadedness.  He had no fevers.  He denies abdominal pain or flank pain.  He denies dysuria.  He reports voiding without difficulty.  He denies sensation of incomplete bladder emptying.  He is voiding with a reasonable flow stream.  He denies gross hematuria.  CT A/P 09/16/2021 revealed a left ureteral stent was in appropriate position with persistent mild hydronephrosis.  He remains afebrile with no leukocytosis, AKI which is improving, urinalysis with negative nitrite, large leukocyte esterase and UCx pending.  He is receiving cefepime.  Past Medical History:  Diagnosis Date   AAA (abdominal aortic aneurysm)    3.5cm by 04/06/21 CT   Arthritis    Cancer of trigone of urinary bladder ([place]) 11/05/2020   Chronic diastolic heart failure (HCC)    Normal LV Fxn by Echo 04/2017    Chronic venous insufficiency 2015   s/p Bilateral Iliac & Femoral Stents (including IVC) - DEEP Venous Reflux.  Also s/p Bilateral GSV Ablation.   Dyspnea    Essential hypertension    History of kidney stones    Hx of adenomatous colonic polyps    Hyperlipidemia with target LDL less than 100    Longstanding persistent atrial  fibrillation ([place])    Nephrolithiasis 05/2019   CKD stage 3   Obesity    OSA on CPAP    does not cpap    Pneumonia 08/2019   Pre-diabetes    Pulmonary hypertension ([place])    Related to OSA, partial component of HFpEF   Pyelonephritis 05/2019    Past Surgical History:  Procedure Laterality Date   ABDOMINAL VENOUS DUPLEX Bilateral 07/2014   ([facility]: Dr. Cleda Mccreedy): (prior to Bilateral Iliac-Femoral Venous Stent Placement): Patent IVC.  No DVT B/L Iliac or Com fem V..  +++ DEEP V REFLUX B/L ILIAC & FEM V.  Absent Superficial V Reflux B/L x L SSV (too tortuous for intervention).  BL GSV occluded - prior ablation.    ABDOMINAL VENOUS DUPLEX Bilateral 11/2014   Absent DVT.  Left common femoral venous reflux: 1508 MS left common femoral vein, 2284MS left proximal femoral vein -> cystic structure noted in the left popliteal vein (7.6 x 1.4 x 3.8 cm).;  Right leg deep venous reflux noted in the distal iliac, common femoral, proximal profunda femoral, proximal superficial femoral, superficial femoral and popliteal vein.   CARDIAC CATHETERIZATION  10/2009   [facility] - Dr. 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  EF~45%.  Mild pulmonary pretension.   CARDIAC PET  08/2009   Inferolateral partially reversible defect -- > false positive by cath   COLONOSCOPY     CYSTOSCOPY W/ URETERAL STENT PLACEMENT Left 01/19/2021   Procedure:  CYSTOSCOPY WITH STENT EXCHANGE;  Surgeon: Ceasar Mons, MD;  Location: [facility];  Service: Urology;  Laterality: Left;  ONLY NEEDS 30 MIN   CYSTOSCOPY WITH STENT PLACEMENT Left 05/12/2021   Procedure: CYSTOSCOPY WITH LEFT STENT EXCHANGE;  Surgeon: Ceasar Mons, MD;  Location: [facility];  Service: Urology;  Laterality: Left;  ONLY NEEDS 30 MIN   CYSTOSCOPY/URETEROSCOPY/HOLMIUM LASER/STENT PLACEMENT Left 11/06/2019   Procedure: CYSTOSCOPY/RETROGRADE/URETEROSCOPY/HOLMIUM LASER/STENT  PLACEMENT. REMOVAL NEPHROSCOPYTUBE;  Surgeon: Ceasar Mons, MD;  Location: [facility];  Service: Urology;  Laterality: Left;   ESOPHAGOGASTRODUODENOSCOPY     ESOPHAGOGASTRODUODENOSCOPY N/A 08/11/2021   Procedure: ESOPHAGOGASTRODUODENOSCOPY (EGD);  Surgeon: Arta Silence, MD;  Location: [facility];  Service: Endoscopy;  Laterality: N/A;   GREATER SAPHENOUS VEIN ABLATION Bilateral Before 2015   [facility] (Dr. Eugenie Filler)   IR NEPHROSTOMY EXCHANGE LEFT  08/28/2019   IR NEPHROSTOMY PLACEMENT LEFT  06/24/2019   IR TRANSCATH PLC STENT  EA ADD VEIN  INC ANGIOPLASTY Left 10/2014   ([place], Dr. 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 Stormy Fabian): US Guided --> R Common & External Iliac, Common Femoral Venography with Placement of 24 mmx 70 mm Stent - R Com Iliac-Ext Iliac- & Com Fem V. reducing 60-80% venous compression to ~0%).;; also noted significant L-sided venous compression (staged stenting).    LIPOMA EXCISION     x 2 came back again   NM MYOVIEW LTD  11/2005   Adenosine Myoview [facility] - Dr. Cleda Mccreedy): Normal myocardial perfusion scan. No ischemia or infarction.   TRANSTHORACIC ECHOCARDIOGRAM  04/26/2017    [facility] - Dr. Cleda Mccreedy) - report not available.  Per clinic note: Normal global LV function. Mild concentric temperature. Mild TR. Mild pulmonary hypertension.;;    TRANSTHORACIC ECHOCARDIOGRAM  7/'15; 7/'17   [facility] - Dr. Saverio Danker) a) low normal LV function (EF 56%). Moderate concentric LVH. Mild LA dilation. Normal pulmonary pressures.  ;; b) normal global function - EF 60-65%. Moderate pulmonary hypertension. Biatrial enlargement. Moderate  concentric LVH.   TRANSTHORACIC ECHOCARDIOGRAM  02/2010   [facility] - Dr. Saverio Danker): Normal global LV function. Mild LVH. EF 60%.; November 2010. Moderate LV dilation. Moderate LVH. Normal EF 50-55%. Mild to moderate pulmonary hypertension. (July 2012 showed normal function with mild LVH, mild MR noted in August 2014)   TRANSTHORACIC ECHOCARDIOGRAM  05/2019   During admission for UTI, sepsis with A. fib RVR: LV normal size and function.  EF 60-65%.  Mildly reduced RV size with severely reduced function.  Despite this normal atrial size noted with just only mild right atrial enlargement with elevated RAP (dilated IVC).  Relatively poor TR jet, but findings do suggest elevated RV pressures.   TRANSURETHRAL RESECTION OF BLADDER TUMOR Left 10/21/2020   Procedure: TRANSURETHRAL RESECTION OF BLADDER TUMOR (TURBT)/ RETROGRADE/  URETERAL STENT PLACEMENT;  Surgeon: Ceasar Mons, MD;  Location[facility];  Service: Urology;  Laterality: Left;    Current Hospital Medications:  Home Meds:  No current facility-administered medications on file prior to encounter.   Current Outpatient Medications on File Prior to Encounter  Medication Sig Dispense Refill   atorvastatin (LIPITOR) 10 MG  tablet TAKE 1 TABLET DAILY (Patient taking differently: Take 10 mg by mouth daily.) 90 tablet 3   docusate sodium (COLACE) 100 MG capsule Take 1 capsule (100 mg total) by mouth 2 (two) times daily. 10 capsule 0   HYDROcodone-acetaminophen (NORCO/VICODIN) 5-325 MG tablet Take 1 tablet by mouth every 6 (six) hours as needed for moderate pain.     loperamide (IMODIUM A-D) 2 MG tablet Take 2-4 mg by mouth 4 (four) times daily as needed for diarrhea or loose stools.     metoprolol tartrate (LOPRESSOR) 25 MG tablet Take 25-37.5 mg by mouth See admin instructions. Take 25 mg by mouth in the morning then 37.5 (1.5 tablets) in the evening     pantoprazole (PROTONIX) 40 MG tablet Take 1 tablet (40 mg total) by mouth  2 (two) times daily. 60 tablet 11   polyethylene glycol (MIRALAX / GLYCOLAX) 17 g packet Take 17 g by mouth daily.     potassium chloride SA (KLOR-CON) 20 MEQ tablet Take 2 tablets (40 mEq total) by mouth daily. 30 tablet 0   prochlorperazine (COMPAZINE) 10 MG tablet TAKE 1 TABLET(10 MG) BY MOUTH EVERY 6 HOURS AS NEEDED FOR NAUSEA OR VOMITING (Patient taking differently: Take 10 mg by mouth every 6 (six) hours as needed for nausea or vomiting.) 30 tablet 0   tamsulosin (FLOMAX) 0.4 MG CAPS capsule Take 0.4 mg by mouth daily.     torsemide (DEMADEX) 20 MG tablet Take 4 tablets in the AM and 3 tablets in the PM alternating with 3 tablets in the AM and 2 tablets in the PM (Patient taking differently: Take 40-80 mg by mouth See admin instructions. Take 4 tablets in the AM and 3 tablets in the PM alternating with 3 tablets in the AM and 2 tablets in the PM) 180 tablet 0   XARELTO 20 MG TABS tablet Take 1 tablet (20 mg total) by mouth daily with supper. 90 tablet 3   Nystatin (GERHARDT'S BUTT CREAM) CREA Apply 1 application topically 3 (three) times daily.       Scheduled Meds:  atorvastatin  10 mg Oral Daily   chlorhexidine  15 mL Mouth Rinse BID   Chlorhexidine Gluconate Cloth  6 each Topical Daily   insulin aspart  0-9 Units Subcutaneous Q4H   mouth rinse  15 mL Mouth Rinse q12n4p   Continuous Infusions:  sodium chloride 75 mL/hr at 09/16/21 2153   sodium chloride 75 mL/hr (09/17/21 0206)   ceFEPime (MAXIPIME) IV     lactated ringers 150 mL/hr at 09/17/21 0600   metronidazole     [START ON 09/18/2021] vancomycin     PRN Meds:.acetaminophen **OR** acetaminophen, HYDROcodone-acetaminophen  Allergies:  Allergies  Allergen Reactions   Aldactone [Spironolactone] Other (See Comments)    Unknown reaction     Family History  Problem Relation Age of Onset   Colon cancer Mother    Heart attack Father 41   Hypertension Brother    Cancer Sister    Colon cancer Sister    Other Neg Hx         He really does not know much about his parents and siblings health, but they did not speak about healthcare    Social History:  reports that he quit smoking about 26 years ago. His smoking use included cigarettes. He has a 35.00 pack-year smoking history. He has never used smokeless tobacco. He reports current alcohol use. He reports that he does not use drugs.  ROS: A complete  review of systems was performed.  All systems are negative except for pertinent findings as noted.  Physical Exam:  Vital signs in last 24 hours: Temp:  [97.5 F (36.4 C)-98.8 F (37.1 C)] 97.5 F (36.4 C) (12/23 0414) Pulse Rate:  [71-98] 73 (12/23 0600) Resp:  [12-29] 15 (12/23 0600) BP: (87-156)/(35-132) 112/49 (12/23 0600) SpO2:  [88 %-100 %] 100 % (12/23 0600) Weight:  [108.9 kg-121.3 kg] 121.3 kg (12/23 0200) Constitutional:  Alert and oriented, No acute distress Cardiovascular: Regular rate and rhythm Respiratory: Normal respiratory effort, Lungs clear bilaterally GI: Abdomen is soft, nontender, nondistended, no abdominal masses GU: No CVA tenderness Neurologic: Grossly intact, no focal deficits Psychiatric: Normal mood and affect  Laboratory Data:  Recent Labs    09/16/21 2035 09/17/21 0312  WBC 9.1 6.0  HGB 11.2* 9.2*  HCT 35.9* 29.1*  PLT 288 228    Recent Labs    09/16/21 2035 09/17/21 0312  NA 136 137  K 4.4 4.2  CL 101 105  GLUCOSE 253* 101*  BUN 38* 36*  CALCIUM 8.7* 8.1*  CREATININE 3.03* 2.45*     Results for orders placed or performed during the hospital encounter of 09/16/21 (from the past 24 hour(s))  CBG monitoring, ED     Status: Abnormal   Collection Time: 09/16/21  8:29 PM  Result Value Ref Range   Glucose-Capillary 231 (H) 70 - 99 mg/dL  Comprehensive metabolic panel     Status: Abnormal   Collection Time: 09/16/21  8:35 PM  Result Value Ref Range   Sodium 136 135 - 145 mmol/L   Potassium 4.4 3.5 - 5.1 mmol/L   Chloride 101 98 - 111 mmol/L   CO2 18 (L) 22  - 32 mmol/L   Glucose, Bld 253 (H) 70 - 99 mg/dL   BUN 38 (H) 8 - 23 mg/dL   Creatinine, Ser 3.03 (H) 0.61 - 1.24 mg/dL   Calcium 8.7 (L) 8.9 - 10.3 mg/dL   Total Protein 7.4 6.5 - 8.1 g/dL   Albumin 3.3 (L) 3.5 - 5.0 g/dL   AST 20 15 - 41 U/L   ALT 12 0 - 44 U/L   Alkaline Phosphatase 74 38 - 126 U/L   Total Bilirubin 1.1 0.3 - 1.2 mg/dL   GFR, Estimated 20 (L) >60 mL/min   Anion gap 17 (H) 5 - 15  Troponin I (High Sensitivity)     Status: None   Collection Time: 09/16/21  8:35 PM  Result Value Ref Range   Troponin I (High Sensitivity) 15 <18 ng/L  CBC with Differential     Status: Abnormal   Collection Time: 09/16/21  8:35 PM  Result Value Ref Range   WBC 9.1 4.0 - 10.5 K/uL   RBC 3.77 (L) 4.22 - 5.81 MIL/uL   Hemoglobin 11.2 (L) 13.0 - 17.0 g/dL   HCT 35.9 (L) 39.0 - 52.0 %   MCV 95.2 80.0 - 100.0 fL   MCH 29.7 26.0 - 34.0 pg   MCHC 31.2 30.0 - 36.0 g/dL   RDW 15.7 (H) 11.5 - 15.5 %   Platelets 288 150 - 400 K/uL   nRBC 0.0 0.0 - 0.2 %   Neutrophils Relative % 87 %   Neutro Abs 8.0 (H) 1.7 - 7.7 K/uL   Lymphocytes Relative 5 %   Lymphs Abs 0.4 (L) 0.7 - 4.0 K/uL   Monocytes Relative 6 %   Monocytes Absolute 0.5 0.1 - 1.0 K/uL   Eosinophils Relative 0 %  Eosinophils Absolute 0.0 0.0 - 0.5 K/uL   Basophils Relative 0 %   Basophils Absolute 0.0 0.0 - 0.1 K/uL   Immature Granulocytes 2 %   Abs Immature Granulocytes 0.15 (H) 0.00 - 0.07 K/uL  Brain natriuretic peptide     Status: None   Collection Time: 09/16/21  8:35 PM  Result Value Ref Range   B Natriuretic Peptide 83.2 0.0 - 100.0 pg/mL  Lactic acid, plasma     Status: Abnormal   Collection Time: 09/16/21  8:35 PM  Result Value Ref Range   Lactic Acid, Venous 7.2 (HH) 0.5 - 1.9 mmol/L  Protime-INR     Status: Abnormal   Collection Time: 09/16/21  8:35 PM  Result Value Ref Range   Prothrombin Time 16.1 (H) 11.4 - 15.2 seconds   INR 1.3 (H) 0.8 - 1.2  APTT     Status: None   Collection Time: 09/16/21  8:35 PM   Result Value Ref Range   aPTT 27 24 - 36 seconds  Resp Panel by RT-PCR (Flu A&B, Covid) Nasopharyngeal Swab     Status: None   Collection Time: 09/16/21  8:43 PM   Specimen: Nasopharyngeal Swab; Nasopharyngeal(NP) swabs in vial transport medium  Result Value Ref Range   SARS Coronavirus 2 by RT PCR NEGATIVE NEGATIVE   Influenza A by PCR NEGATIVE NEGATIVE   Influenza B by PCR NEGATIVE NEGATIVE  Troponin I (High Sensitivity)     Status: Abnormal   Collection Time: 09/17/21 12:12 AM  Result Value Ref Range   Troponin I (High Sensitivity) 27 (H) <18 ng/L  Lactic acid, plasma     Status: Abnormal   Collection Time: 09/17/21 12:12 AM  Result Value Ref Range   Lactic Acid, Venous 3.3 (HH) 0.5 - 1.9 mmol/L  MRSA Next Gen by PCR, Nasal     Status: None   Collection Time: 09/17/21  2:02 AM   Specimen: Nasal Mucosa; Nasal Swab  Result Value Ref Range   MRSA by PCR Next Gen NOT DETECTED NOT DETECTED  Glucose, capillary     Status: Abnormal   Collection Time: 09/17/21  2:16 AM  Result Value Ref Range   Glucose-Capillary 103 (H) 70 - 99 mg/dL  Hepatic function panel     Status: Abnormal   Collection Time: 09/17/21  3:12 AM  Result Value Ref Range   Total Protein 5.8 (L) 6.5 - 8.1 g/dL   Albumin 2.5 (L) 3.5 - 5.0 g/dL   AST 19 15 - 41 U/L   ALT 10 0 - 44 U/L   Alkaline Phosphatase 60 38 - 126 U/L   Total Bilirubin 1.0 0.3 - 1.2 mg/dL   Bilirubin, Direct 0.3 (H) 0.0 - 0.2 mg/dL   Indirect Bilirubin 0.7 0.3 - 0.9 mg/dL  Hemoglobin A1c     Status: None   Collection Time: 09/17/21  3:12 AM  Result Value Ref Range   Hgb A1c MFr Bld 5.4 4.8 - 5.6 %   Mean Plasma Glucose 108.28 mg/dL  Lactic acid, plasma     Status: Abnormal   Collection Time: 09/17/21  3:12 AM  Result Value Ref Range   Lactic Acid, Venous 2.8 (HH) 0.5 - 1.9 mmol/L  Magnesium     Status: None   Collection Time: 09/17/21  3:12 AM  Result Value Ref Range   Magnesium 1.9 1.7 - 2.4 mg/dL  Phosphorus     Status: None    Collection Time: 09/17/21  3:12 AM  Result Value  Ref Range   Phosphorus 3.6 2.5 - 4.6 mg/dL  CBC WITH DIFFERENTIAL     Status: Abnormal   Collection Time: 09/17/21  3:12 AM  Result Value Ref Range   WBC 6.0 4.0 - 10.5 K/uL   RBC 3.15 (L) 4.22 - 5.81 MIL/uL   Hemoglobin 9.2 (L) 13.0 - 17.0 g/dL   HCT 29.1 (L) 39.0 - 52.0 %   MCV 92.4 80.0 - 100.0 fL   MCH 29.2 26.0 - 34.0 pg   MCHC 31.6 30.0 - 36.0 g/dL   RDW 15.7 (H) 11.5 - 15.5 %   Platelets 228 150 - 400 K/uL   nRBC 0.0 0.0 - 0.2 %   Neutrophils Relative % 83 %   Neutro Abs 5.0 1.7 - 7.7 K/uL   Lymphocytes Relative 9 %   Lymphs Abs 0.5 (L) 0.7 - 4.0 K/uL   Monocytes Relative 7 %   Monocytes Absolute 0.4 0.1 - 1.0 K/uL   Eosinophils Relative 0 %   Eosinophils Absolute 0.0 0.0 - 0.5 K/uL   Basophils Relative 0 %   Basophils Absolute 0.0 0.0 - 0.1 K/uL   Immature Granulocytes 1 %   Abs Immature Granulocytes 0.07 0.00 - 0.07 K/uL  TSH     Status: None   Collection Time: 09/17/21  3:12 AM  Result Value Ref Range   TSH 1.638 0.350 - 4.500 uIU/mL  Comprehensive metabolic panel     Status: Abnormal   Collection Time: 09/17/21  3:12 AM  Result Value Ref Range   Sodium 137 135 - 145 mmol/L   Potassium 4.2 3.5 - 5.1 mmol/L   Chloride 105 98 - 111 mmol/L   CO2 22 22 - 32 mmol/L   Glucose, Bld 101 (H) 70 - 99 mg/dL   BUN 36 (H) 8 - 23 mg/dL   Creatinine, Ser 2.45 (H) 0.61 - 1.24 mg/dL   Calcium 8.1 (L) 8.9 - 10.3 mg/dL   Total Protein 5.9 (L) 6.5 - 8.1 g/dL   Albumin 2.7 (L) 3.5 - 5.0 g/dL   AST 18 15 - 41 U/L   ALT 11 0 - 44 U/L   Alkaline Phosphatase 60 38 - 126 U/L   Total Bilirubin 1.0 0.3 - 1.2 mg/dL   GFR, Estimated 26 (L) >60 mL/min   Anion gap 10 5 - 15  CK     Status: None   Collection Time: 09/17/21  3:12 AM  Result Value Ref Range   Total CK 306 49 - 397 U/L  Procalcitonin     Status: None   Collection Time: 09/17/21  3:12 AM  Result Value Ref Range   Procalcitonin 0.40 ng/mL  Glucose, capillary     Status:  None   Collection Time: 09/17/21  4:11 AM  Result Value Ref Range   Glucose-Capillary 96 70 - 99 mg/dL  Urinalysis, Routine w reflex microscopic Urine, Clean Catch     Status: Abnormal   Collection Time: 09/17/21  5:21 AM  Result Value Ref Range   Color, Urine YELLOW YELLOW   APPearance CLOUDY (A) CLEAR   Specific Gravity, Urine 1.020 1.005 - 1.030   pH 6.0 5.0 - 8.0   Glucose, UA NEGATIVE NEGATIVE mg/dL   Hgb urine dipstick LARGE (A) NEGATIVE   Bilirubin Urine NEGATIVE NEGATIVE   Ketones, ur NEGATIVE NEGATIVE mg/dL   Protein, ur 30 (A) NEGATIVE mg/dL   Nitrite NEGATIVE NEGATIVE   Leukocytes,Ua LARGE (A) NEGATIVE   RBC / HPF 11-20 0 - 5  RBC/hpf   WBC, UA >50 (H) 0 - 5 WBC/hpf   Bacteria, UA FEW (A) NONE SEEN   Squamous Epithelial / LPF 0-5 0 - 5   WBC Clumps PRESENT    Mucus PRESENT   Sodium, urine, random     Status: None   Collection Time: 09/17/21  5:22 AM  Result Value Ref Range   Sodium, Ur 18 mmol/L  Creatinine, urine, random     Status: None   Collection Time: 09/17/21  5:22 AM  Result Value Ref Range   Creatinine, Urine 96.53 mg/dL   Recent Results (from the past 240 hour(s))  Resp Panel by RT-PCR (Flu A&B, Covid) Nasopharyngeal Swab     Status: None   Collection Time: 09/16/21  8:43 PM   Specimen: Nasopharyngeal Swab; Nasopharyngeal(NP) swabs in vial transport medium  Result Value Ref Range Status   SARS Coronavirus 2 by RT PCR NEGATIVE NEGATIVE Final    Comment: (NOTE) SARS-CoV-2 target nucleic acids are NOT DETECTED.  The SARS-CoV-2 RNA is generally detectable in upper respiratory specimens during the acute phase of infection. The lowest concentration of SARS-CoV-2 viral copies this assay can detect is 138 copies/mL. A negative result does not preclude SARS-Cov-2 infection and should not be used as the sole basis for treatment or other patient management decisions. A negative result may occur with  improper specimen collection/handling, submission of  specimen other than nasopharyngeal swab, presence of viral mutation(s) within the areas targeted by this assay, and inadequate number of viral copies(<138 copies/mL). A negative result must be combined with clinical observations, patient history, and epidemiological information. The expected result is Negative.  Fact Sheet for Patients:  EntrepreneurPulse.com.au  Fact Sheet for Healthcare Providers:  IncredibleEmployment.be  This test is no t yet approved or cleared by the [place] FDA and  has been authorized for detection and/or diagnosis of SARS-CoV-2 by FDA under an Emergency Use Authorization (EUA). This EUA will remain  in effect (meaning this test can be used) for the duration of the COVID-19 declaration under Section 564(b)(1) of the Act, 21 U.S.C.section 360bbb-3(b)(1), unless the authorization is terminated  or revoked sooner.       Influenza A by PCR NEGATIVE NEGATIVE Final   Influenza B by PCR NEGATIVE NEGATIVE Final    Comment: (NOTE) The Xpert Xpress SARS-CoV-2/FLU/RSV plus assay is intended as an aid in the diagnosis of influenza from Nasopharyngeal swab specimens and should not be used as a sole basis for treatment. Nasal washings and aspirates are unacceptable for Xpert Xpress SARS-CoV-2/FLU/RSV testing.  Fact Sheet for Patients: EntrepreneurPulse.com.au  Fact Sheet for Healthcare Providers: IncredibleEmployment.be  This test is not yet approved or cleared by the [place] FDA and has been authorized for detection and/or diagnosis of SARS-CoV-2 by FDA under an Emergency Use Authorization (EUA). This EUA will remain in effect (meaning this test can be used) for the duration of the COVID-19 declaration under Section 564(b)(1) of the Act, 21 U.S.C. section 360bbb-3(b)(1), unless the authorization is terminated or revoked.  Performed at [facility], [place]  [address] [place], [place] [phone]   MRSA Next Gen by PCR, Nasal     Status: None   Collection Time: 09/17/21  2:02 AM   Specimen: Nasal Mucosa; Nasal Swab  Result Value Ref Range Status   MRSA by PCR Next Gen NOT DETECTED NOT DETECTED Final    Comment: (NOTE) The GeneXpert MRSA Assay (FDA approved for NASAL specimens only), is one component of a  comprehensive MRSA colonization surveillance program. It is not intended to diagnose MRSA infection nor to guide or monitor treatment for MRSA infections. Test performance is not FDA approved in patients less than 86 years old. Performed at [facility], [place] [address], [place], [place] [address]     Renal Function: Recent Labs    09/16/21 2035 09/17/21 0312  CREATININE 3.03* 2.45*   Estimated Creatinine Clearance: 32.3 mL/min (A) (by C-G formula based on SCr of 2.45 mg/dL (H)).  Radiologic Imaging: CT Abdomen Pelvis Wo Contrast  Result Date: 09/16/2021 CLINICAL DATA:  Acute abdominal pain, possible sepsis, history of left ureteral stent EXAM: CT ABDOMEN AND PELVIS WITHOUT CONTRAST TECHNIQUE: Multidetector CT imaging of the abdomen and pelvis was performed following the standard protocol without IV contrast. COMPARISON:  08/11/2021 FINDINGS: Lower chest: No acute abnormality. Hepatobiliary: Liver is within normal limits. Gallbladder is well distended. A few small dependent gallstones are seen 1 of which lies in the gallbladder neck. No inflammatory changes are seen. Pancreas: Unremarkable. No pancreatic ductal dilatation or surrounding inflammatory changes. Spleen: Normal in size without focal abnormality. Adrenals/Urinary Tract: Adrenal glands are within normal limits. Right kidney shows no focal abnormality. No renal calculi are seen. Left kidney demonstrates mild cortical thinning. Left hydronephrosis is noted with a ureteral stent in place. No definitive renal or ureteral stones are noted. The bladder is partially  distended. Stomach/Bowel: The appendix is within normal limits. No obstructive or inflammatory changes of the colon are seen. Small bowel and stomach are within normal limits. Vascular/Lymphatic: Atherosclerotic calcifications are noted. Aneurysmal dilatation of the infrarenal aorta to 3.4 cm is noted. Bilateral iliac venous stents are noted. No significant lymphadenopathy is noted. Reproductive: Prostate is unremarkable. Other: No abdominal wall hernia or abnormality. No abdominopelvic ascites. Musculoskeletal: No acute or significant osseous findings. IMPRESSION: Left ureteral stent in place with persistent mild hydronephrosis. No calculi are seen. Cholelithiasis without inflammatory change although a stone is noted within the gallbladder neck. 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 Recommend follow-up every 3 years. Reference: J Am Coll Radiol 4235;36:144-315. Electronically Signed   By: Inez Catalina M.D.   On: 09/16/2021 23:08   DG Chest Port 1 View  Result Date: 09/16/2021 CLINICAL DATA:  Weakness and decreased oral intake. EXAM: PORTABLE CHEST 1 VIEW COMPARISON:  None. FINDINGS: Mild, stable linear atelectasis is seen within the right lung base. There is stable elevation of the right hemidiaphragm. There is no evidence of a pleural effusion or pneumothorax. The heart size and mediastinal contours are within normal limits. The visualized skeletal structures are unremarkable. IMPRESSION: Mild, stable right basilar linear atelectasis. Electronically Signed   By: Virgina Norfolk M.D.   On: 09/16/2021 21:18    I independently reviewed the above imaging studies.  Impression/Recommendation Possible UTI: Afebrile, no leukocytosis, UA with negative nitrite, positive leukocyte esterase.  Urine culture pending.  Presented with fatigue, deconditioning. 2.  AKI on CKD: Improving with resuscitation. 3.  Muscle invasive bladder cancer: S/p chemoradiation therapy as primary treatment in 2022 4.  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 -He is due for stent exchange which can occur electively after he is treated with antibiotics.  I will message Dr. Lovena Neighbours and his staff to schedule. -Follow-up urine culture.  Continue cefepime.  He require  at least 2 weeks p.o. antibiotics. -AKI is improving with fluid resuscitation.  I expect that this was due to his dehydration.  Stent does not appear obstructed with only mild hydronephrosis.  Matt R. Dorsey Authement MD 09/17/2021, 6:58 AM  [facility]  Pager: [phone]   CC: Toy Baker, MD

## 2021-09-17 NOTE — Progress Notes (Signed)
Initial Nutrition Assessment  DOCUMENTATION CODES:  Obesity unspecified  INTERVENTION:  Continue regular diet.  Add Ensure Enlive po TID, each supplement provides 350 kcal and 20 grams of protein.  Add Magic cup TID with meals, each supplement provides 290 kcal and 9 grams of protein.  Add MVI with minerals daily.  Encourage PO and supplement intake.  NUTRITION DIAGNOSIS:  Inadequate oral intake related to poor appetite as evidenced by per patient/family report.  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 Admitted with acute on chronic renal failure.  Spoke with pt at bedside. Pt reports that he has had a poor appetite for a "good period of time now." Pt could not elaborate when RD asked further. Also could not elaborate on what he had been eating.  Patient's diet just upgraded to regular this morning - RD brought in menu and explained how to order. Pt seemed confused. RD added assistance with room service into pt's diet order.  Of note, pt has been agressivley diuresed and have lost up to 90 pounds, per pt report. All weight loss is likely fluid related.  Pt open to Ensure and Magic Cup supplements. RD to order.  Medications: reviewed; SSI, NaCl @ 125 ml/hr, Vicodin PO PRN (given once today)  Labs: reviewed; CBG 96-231 HbA1c: 5.4% (09/17/2021)  NUTRITION - FOCUSED PHYSICAL EXAM: Flowsheet Row Most Recent Value  Orbital Region Mild depletion  Upper Arm Region Moderate depletion  Thoracic and Lumbar Region No depletion  Buccal Region Mild depletion   Temple Region Mild depletion  Clavicle Bone Region Mild depletion  Clavicle and Acromion Bone Region Mild depletion  Scapular Bone Region No depletion  Dorsal Hand No depletion  Patellar Region No depletion  Anterior Thigh Region No depletion  Posterior Calf Region No depletion  Edema (RD Assessment) Moderate  Hair Reviewed  Eyes Reviewed  Mouth Reviewed  Skin Reviewed  Nails Reviewed   Diet Order:   Diet Order             Diet regular Room service appropriate? Yes with Assist; Fluid consistency: Thin  Diet effective now                  EDUCATION NEEDS:  Education needs have been addressed  Skin:  Skin Assessment: Skin Integrity Issues: Skin Integrity Issues:: DTI, Stage II DTI: L heel Stage II: Sacrum  Last BM:  09/17/21  Height:  Ht Readings from Last 1 Encounters:  08/11/21 6\' 1"  (1.854 m)   Weight:  Wt Readings from Last 1 Encounters:  09/17/21 121.3 kg   BMI:  Body mass index is 35.28 kg/m.  Estimated Nutritional Needs:  Kcal:  2100-2300 Protein:  130-145 grams Fluid:  >2.1 L  Derrel Nip, RD, LDN (she/her/hers) Clinical Inpatient Dietitian RD Pager/After-Hours/Weekend Pager # in [place]

## 2021-09-17 NOTE — Assessment & Plan Note (Signed)
Has not been compliant with CPAP.  Would resume

## 2021-09-17 NOTE — Assessment & Plan Note (Addendum)
Not clear if this is sepsis.  Initial urine culture with multiple species.  Repeat pending. Urology consulted, recommended 14 days antibiotics and outpatietn stent exchange. - Narrow antibiotics and follow fever curve, WBC - start bactrim, day 4 of 14 - Follow repeat urine culture

## 2021-09-17 NOTE — Discharge Instructions (Signed)
Nutrition Post Hospital Stay Proper nutrition can help your body recover from illness and injury.   Foods and beverages high in protein, vitamins, and minerals help rebuild muscle loss, promote healing, & reduce fall risk.   .In addition to eating healthy foods, a nutrition shake is an easy, delicious way to get the nutrition you need during and after your hospital stay  It is recommended that you continue to drink 2 bottles per day of:       Ensure for at least 1 month (30 days) after your hospital stay   Tips for adding a nutrition shake into your routine: As allowed, drink one with vitamins or medications instead of water or juice Enjoy one as a tasty mid-morning or afternoon snack Drink cold or make a milkshake out of it Drink one instead of milk with cereal or snacks Use as a coffee creamer   Available at the following grocery stores and pharmacies:           * [employer] * [employer] * [employer]co  * R[employer] Aid          * Walmart * [employer]  * Walgreens      * Target  * BJ's   * CVS  * [employer]   * [facility] [phone]            For COUPONS visit: www.ensure.com/join or [email or web address]/members/sign-[email or web address]   Suggested Substitutions Ensure Plus = Boost Plus = Carnation Breakfast Essentials = Boost Compact Ensure Active Clear = Boost Breeze Glucerna Shake = Boost Glucose Control = Carnation Breakfast Essentials SUGAR FREE       

## 2021-09-17 NOTE — Progress Notes (Signed)
LA 2.8. Olena Heckle, NP notified.

## 2021-09-17 NOTE — Assessment & Plan Note (Addendum)
Blood pressure normalized off meds -Hold metoprolol - Resume torsmeide

## 2021-09-17 NOTE — Assessment & Plan Note (Signed)
-   currently appears to be slightly on the dry side, hold home diuretics for tonight and restart when appears euvolemic, carefuly follow fluid status and Cr  

## 2021-09-17 NOTE — Assessment & Plan Note (Signed)
-    evidence of acute renal failure due to presence of following: Cr increased >0.3 from baseline likely secondary to dehydration      check FeNA       Rehydrate with IV fluids   Ct showing no obstruction       If persists despite fluid resuscitation will need renal consult.   - avoid nephrotoxic medications such as NSAIDs, Vanco Zosyn combo,  avoid hypotension, continue to follow renal function

## 2021-09-17 NOTE — Assessment & Plan Note (Signed)
Chronic stable follow-up nutritional consult as an outpatient

## 2021-09-17 NOTE — Progress Notes (Signed)
Pharmacy Antibiotic Note  Patrick Bishop is a 80 y.o. male admitted on 09/16/2021 with sepsis.  Pharmacy has been consulted for Vancomycin and Cefepime dosing.  In the ED patient received Vancomycin 2gm, Cefepime 2gm and Metronidazole 500mg  IV x 1 dose each.  Plan: Vancomycin 1250 mg IV Q 48 hrs. Goal AUC 400-550.  Expected AUC: 513.9  SCr used: 3.03 Cefepime 2gm IV q24h Metronidazole per MD Follow renal function Follow culture results  Weight: 108.9 kg (240 lb)  Temp (24hrs), Avg:98.8 F (37.1 C), Min:98.8 F (37.1 C), Max:98.8 F (37.1 C)  Recent Labs  Lab 09/16/21 2035  WBC 9.1  CREATININE 3.03*  LATICACIDVEN 7.2*    Estimated Creatinine Clearance: 24.7 mL/min (A) (by C-G formula based on SCr of 3.03 mg/dL (H)).    Allergies  Allergen Reactions   Aldactone [Spironolactone] Other (See Comments)    Unknown reaction     Antimicrobials this admission: 12/22 Vancomycin >>   12/22 Cefepime >>   12/22 Metronidazole >.  Dose adjustments this admission:    Microbiology results: 12/22 BCx:     Thank you for allowing pharmacy to be a part of this patients care.  Everette Rank, PharmD 09/17/2021 12:50 AM

## 2021-09-17 NOTE — Assessment & Plan Note (Signed)
-   Wound care consult 

## 2021-09-17 NOTE — Assessment & Plan Note (Signed)
Resume home pain medications if blood pressure allows

## 2021-09-17 NOTE — Consult Note (Signed)
WOC Nurse Consult Note: Patient receiving care in [facility] ICU 1233. 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 Heel foam dressing in place. Pressure Injury POA: Yes Measurement: 3 cm x 4 cm Wound bed: dried, stable, maroonish in color Drainage (amount, consistency, odor) none Periwound: intact Dressing procedure/placement/frequency: Apply iodine from the swabsticks or swab pads from clean utility to the discolored area on the left heel.  Allow to air dry. FLOAT the heel on as many pillows as necessary to prevent pressure to the heel.   I have also added to float the heels on as many pillows as necessary to prevent pressure to the heels. And, I have entered a nursing care instruction instructing the nurses to use the Skin Care Standing Orders for any Stage 2 PIs, or skins tears, etc, covered by the order set.  Monitor the wound area(s) for worsening of condition such as: Signs/symptoms of infection,  Increase in size,  Development of or worsening of odor, Development of pain, or increased pain at the affected locations.  Notify the medical team if any of these develop.  Thank you for the consult.  Discussed plan of care with the patient and bedside nurse.  [place] nurse will not follow at this time.  Please re-consult the [place] team if needed.  Val Riles, RN, MSN, CWOCN, CNS-BC, pager [phone]

## 2021-09-17 NOTE — Assessment & Plan Note (Signed)
Hold eliquis for tonight  Given will need stent exchange by Urology   Hold metoprolol given hypotenion

## 2021-09-17 NOTE — Evaluation (Signed)
Physical Therapy Evaluation Patient Details Name: Patrick Bishop MRN: [id number] DOB: 1941-04-18 Today's Date: 09/17/2021  History of Present Illness  Courtney Fenlon is a 80 y.o. male with medical history significant of a fib on xarelto, diastolic HF, HTN, bladder cancer.  Presenting with BLE weakness and increased swelling,He was noted to be volume overloaded. Unable to  bear weight  and ambulate for the last few days.  Clinical Impression  Patient very pleasant and willing to participate with therapy in mobility. Patient indicates incontinence of B/B which he was during treatment. Assisted with bed mobility, rolling. Unable to attempt sitting on bed edge due to hypotension.  Patient known from previous admission. Patient comes from [facility].   BP: 89/50 supine 79/40 after MMT and rolling 72/52 after rolling and pericare and chair position of bed 90/65 back to more supine RN notified.   Pt admitted with above diagnosis.  Pt currently with functional limitations due to the deficits listed below (see PT Problem List). Pt will benefit from skilled PT to increase their independence and safety with mobility to allow discharge to the venue listed below.        Recommendations for follow up therapy are one component of a multi-disciplinary discharge planning process, led by the attending physician.  Recommendations may be updated based on patient status, additional functional criteria and insurance authorization.  Follow Up Recommendations Skilled nursing-short term rehab (<3 hours/day)    Assistance Recommended at Discharge Frequent or constant Supervision/Assistance  Functional Status Assessment Patient has had a recent decline in their functional status and/or demonstrates limited ability to make significant improvements in function in a reasonable and predictable amount of time  Equipment Recommendations  None recommended by PT    Recommendations for Other Services        Precautions / Restrictions Precautions Precautions: Fall Precaution Comments: incontinenet of bowel and bladder Restrictions Weight Bearing Restrictions: No      Mobility  Bed Mobility Overal bed mobility: Needs Assistance Bed Mobility: Rolling Rolling: Supervision         General bed mobility comments: multiple times for cleansing and linen change    Transfers                   General transfer comment: unable due to BP low    Ambulation/Gait                  Stairs            Wheelchair Mobility    Modified Rankin (Stroke Patients Only)       Balance                                             Pertinent Vitals/Pain Pain Assessment: No/denies pain    Home Living Family/patient expects to be discharged to:: Skilled nursing facility                        Prior Function Prior Level of Function : Needs assist       Physical Assist : Mobility (physical);ADLs (physical)   ADLs (physical): Bathing;Dressing;Toileting Mobility Comments: reports standing some with therapy with difficulty ADLs Comments: neesd assistance     Hand Dominance   Dominant Hand: Right    Extremity/Trunk Assessment   Upper Extremity Assessment Upper Extremity Assessment: Defer to OT evaluation RUE  Deficits / Details: WFL ROM, 5/5 strength RUE Sensation: WNL RUE Coordination: WNL LUE Deficits / Details: WFL ROM, 5/5 strength LUE Sensation: WNL LUE Coordination: WNL    Lower Extremity Assessment Lower Extremity Assessment: RLE deficits/detail RLE Deficits / Details: can raise leg ~ 3 inches from bed, noted edema,discoloration    Cervical / Trunk Assessment Cervical / Trunk Assessment: Normal  Communication   Communication: HOH  Cognition Arousal/Alertness: Awake/alert Behavior During Therapy: WFL for tasks assessed/performed Overall Cognitive Status: Within Functional Limits for tasks assessed                                           General Comments General comments (skin integrity, edema, etc.): only able to test when in bed chair position, able to pull self forward using rails.    Exercises     Assessment/Plan    PT Assessment Patient needs continued PT services  PT Problem List Decreased strength;Decreased mobility;Decreased knowledge of precautions;Decreased activity tolerance;Cardiopulmonary status limiting activity       PT Treatment Interventions DME instruction;Therapeutic activities;Therapeutic exercise;Patient/family education;Functional mobility training;Balance training    PT Goals (Current goals can be found in the Care Plan section)  Acute Rehab PT Goals Patient Stated Goal: agreed to mobility PT Goal Formulation: With patient Time For Goal Achievement: 10/01/21 Potential to Achieve Goals: Fair    Frequency Min 2X/week   Barriers to discharge        Co-evaluation PT/OT/SLP Co-Evaluation/Treatment: Yes Reason for Co-Treatment: For patient/therapist safety PT goals addressed during session: Mobility/safety with mobility OT goals addressed during session: ADL's and self-care       AM-PAC PT "6 Clicks" Mobility  Outcome Measure Help needed turning from your back to your side while in a flat bed without using bedrails?: A Lot Help needed moving from lying on your back to sitting on the side of a flat bed without using bedrails?: A Lot Help needed moving to and from a bed to a chair (including a wheelchair)?: Total Help needed standing up from a chair using your arms (e.g., wheelchair or bedside chair)?: Total Help needed to walk in hospital room?: Total Help needed climbing 3-5 steps with a railing? : Total 6 Click Score: 8    End of Session   Activity Tolerance: Treatment limited secondary to medical complications (Comment) (hypotension) Patient left: in bed;with call bell/phone within reach;with bed alarm set Nurse Communication: Mobility  status;Need for lift equipment PT Visit Diagnosis: Unsteadiness on feet (R26.81)    Time: 1103-1130 PT Time Calculation (min) (ACUTE ONLY): 27 min   Charges:   PT Evaluation $PT Eval Low Complexity: [place] PT Acute Rehabilitation Services Pager [phone] Office [phone]   Claretha Cooper 09/17/2021, 3:38 PM

## 2021-09-17 NOTE — Assessment & Plan Note (Signed)
Stop stool softener and miralax if persists will need further eval

## 2021-09-17 NOTE — Assessment & Plan Note (Signed)
Chronic stable wound care consult ordered

## 2021-09-17 NOTE — Assessment & Plan Note (Addendum)
Continue Xarelto 

## 2021-09-17 NOTE — Assessment & Plan Note (Signed)
-   WOC °

## 2021-09-17 NOTE — Hospital Course (Addendum)
Patrick Bishop is an 80 y.o. 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  In the ER, lactate 7.2 and BP 70s but WBC normal, no fever.  CXR clear, CT abdomen showed left ureteral stent, otherwise mostly unremarkable.  BP initially 70s with EMS, only up to 90s with fluids.  Cr 3.0 mg/dL from baseline 1.9

## 2021-09-17 NOTE — Evaluation (Signed)
Occupational Therapy Evaluation Patient Details Name: Patrick Bishop MRN: [id number] DOB: 06-Jul-1941 Today's Date: 09/17/2021   History of Present Illness Patrick Bishop is a 80 y.o. male with medical history significant of a fib on xarelto, diastolic HF, HTN, bladder cancer.  Presenting with BLE weakness and increased swelling,He was noted to be volume overloaded. Unable to  bear weight  and ambulate for the last few days.   Clinical Impression   [name] is an 80 year old man who presents with generalized weakness, decreased activity tolerance and impaired cardiopulmonary endurance. Today's evaluation and mobility limited by hypotension. Patient's BP 89/50 initially then 79/40 after MMT and rolling in bed. Patient found to be incontinent. Patient supervision for rolling in bed with use of bed rails. BP 72/52 after rolling and pericare and being positioned in chair position. Patient exhibited good upper body strength and ability to move legs in bed.. BP 90/65 when placed in more supine position. Patient will benefit from skilled OT services while in hospital to improve deficits and learn compensatory strategies as needed in order to return to PLOF.  Recommend return to facility for therapy.     Recommendations for follow up therapy are one component of a multi-disciplinary discharge planning process, led by the attending physician.  Recommendations may be updated based on patient status, additional functional criteria and insurance authorization.   Follow Up Recommendations  Skilled nursing-short term rehab (<3 hours/day)    Assistance Recommended at Discharge Frequent or constant Supervision/Assistance  Functional Status Assessment  Patient has had a recent decline in their functional status and demonstrates the ability to make significant improvements in function in a reasonable and predictable amount of time.  Equipment Recommendations  None recommended by OT     Recommendations for Other Services       Precautions / Restrictions Precautions Precautions: Fall Precaution Comments: incontinenet of bowel and bladder Restrictions Weight Bearing Restrictions: No      Mobility Bed Mobility Overal bed mobility: Needs Assistance Bed Mobility: Rolling Rolling: Supervision              Transfers                          Balance                                           ADL either performed or assessed with clinical judgement   ADL Overall ADL's : Needs assistance/impaired Eating/Feeding: Independent   Grooming: Set up;Bed level   Upper Body Bathing: Set up;Bed level   Lower Body Bathing: Maximal assistance;Bed level   Upper Body Dressing : Set up;Bed level   Lower Body Dressing: Maximal assistance;Bed level   Toilet Transfer: Total assistance;+2 for physical assistance   Toileting- Clothing Manipulation and Hygiene: Total assistance Toileting - Clothing Manipulation Details (indicate cue type and reason): incontinent             Vision   Vision Assessment?: No apparent visual deficits     Perception     Praxis      Pertinent Vitals/Pain Pain Assessment: No/denies pain     Hand Dominance Right   Extremity/Trunk Assessment Upper Extremity Assessment Upper Extremity Assessment: RUE deficits/detail;LUE deficits/detail RUE Deficits / Details: WFL ROM, 5/5 strength RUE Sensation: WNL RUE Coordination: WNL LUE Deficits / Details: WFL ROM, 5/5 strength  LUE Sensation: WNL LUE Coordination: WNL           Communication Communication Communication: HOH   Cognition Arousal/Alertness: Awake/alert Behavior During Therapy: WFL for tasks assessed/performed Overall Cognitive Status: Within Functional Limits for tasks assessed                                       General Comments       Exercises     Shoulder Instructions      Home Living Family/patient  expects to be discharged to:: Skilled nursing facility                                        Prior Functioning/Environment Prior Level of Function : Needs assist             Mobility Comments: reports standing some with therapy with difficulty ADLs Comments: neesd assistance        OT Problem List: Decreased activity tolerance;Impaired balance (sitting and/or standing);Decreased strength;Cardiopulmonary status limiting activity;Obesity;Increased edema      OT Treatment/Interventions: Self-care/ADL training;Therapeutic exercise;DME and/or AE instruction;Therapeutic activities;Balance training;Patient/family education    OT Goals(Current goals can be found in the care plan section) Acute Rehab OT Goals Patient Stated Goal: to get stronger OT Goal Formulation: With patient Time For Goal Achievement: 10/01/21 Potential to Achieve Goals: Good  OT Frequency: Min 2X/week   Barriers to D/C:            Co-evaluation              AM-PAC OT "6 Clicks" Daily Activity     Outcome Measure Help from another person eating meals?: None Help from another person taking care of personal grooming?: A Little Help from another person toileting, which includes using toliet, bedpan, or urinal?: Total Help from another person bathing (including washing, rinsing, drying)?: A Lot Help from another person to put on and taking off regular upper body clothing?: A Little Help from another person to put on and taking off regular lower body clothing?: A Lot 6 Click Score: 15   End of Session Nurse Communication:  (BPs)  Activity Tolerance: Patient tolerated treatment well Patient left: in bed;with call bell/phone within reach;with bed alarm set  OT Visit Diagnosis: Other abnormalities of gait and mobility (R26.89)                Time: 5830-9407 OT Time Calculation (min): 26 min Charges:  OT General Charges $OT Visit: 1 Visit OT Evaluation $OT Eval Low Complexity: 1  Low  Samarrah Tranchina, OTR/L [place]  Office [phone] Pager: [phone]   Lenward Chancellor 09/17/2021, 12:51 PM

## 2021-09-17 NOTE — Assessment & Plan Note (Signed)
BMI 35 

## 2021-09-17 NOTE — Assessment & Plan Note (Signed)
Continue lipitor  ?

## 2021-09-17 NOTE — Care Plan (Signed)
Called re: persistently low MAP.  MAP is 60-70 all day.  On exam, patient is comfortable, has no complaints.  Skin normal, mentation good, feels "better".  BP (!) 87/47    Pulse 84    Temp (!) 97.5 F (36.4 C) (Oral)    Resp 15    Wt 121.3 kg    SpO2 100%    BMI 35.28 kg/m    - Repeat bolus now - Continue antibiotics - If BP does not respond to further fluid boluses, will discuss pressors with critical care

## 2021-09-17 NOTE — TOC Initial Note (Signed)
Transition of Care [facility]) - Initial/Assessment Note    Patient Details  Name: Patrick Bishop MRN: [id number] Date of Birth: 08-Nov-1940  Transition of [facility]) CM/SW Contact:    Leeroy Cha, RN Phone Number: 09/17/2021, 7:42 AM  Clinical Narrative:                 Patient from [facility] rehab.  UTI by chart.  Will sent fl2 to [facility] when ready to return.  Expected Discharge Plan: [place] ([place]) Barriers to Discharge: Continued Medical Work up   Patient Goals and CMS Choice Patient states their goals for this hospitalization and ongoing recovery are:: to go back to rehab CMS Medicare.gov Compare Post Acute Care list provided to:: Patient Choice offered to / list presented to : Patient  Expected Discharge Plan and Services Expected Discharge Plan: [place] ([place])   Discharge Planning Services: CM Consult Post Acute Care Choice: [place] Living arrangements for the past 2 months: [place]                                      Prior Living Arrangements/Services Living arrangements for the past 2 months: [place] Lives with:: Facility Resident Patient language and need for interpreter reviewed:: Yes Do you feel safe going back to the place where you live?: Yes      Need for Family Participation in Patient Care: Yes (Comment) Care giver support system in place?: Yes (comment)   Criminal Activity/Legal Involvement Pertinent to Current Situation/Hospitalization: No - Comment as needed  Activities of Daily Living      Permission Sought/Granted                  Emotional Assessment Appearance:: Appears stated age Attitude/Demeanor/Rapport: Engaged Affect (typically observed): Calm Orientation: : Oriented to Place, Oriented to Self, Oriented to  Time, Oriented to Situation Alcohol / Substance Use: Not Applicable Psych Involvement: No  (comment)  Admission diagnosis:  AKI (acute kidney injury) ([place]) [N17.9] Sepsis ([place]) [A41.9] Hypotension, unspecified hypotension type [I95.9] Sepsis, due to unspecified organism, unspecified whether acute organ dysfunction present [facility]) [A41.9] Patient Active Problem List   Diagnosis Date Noted   Sepsis ([place]) 09/17/2021   Diarrhea 09/17/2021   Acute on chronic renal failure ([place]) 09/17/2021   Pressure injury of left heel, stage 2 ([place]) 09/17/2021   SIRS (systemic inflammatory response syndrome) ([place]) 09/17/2021   Chronic respiratory failure with hypoxia ([name]) 09/17/2021   Anemia of chronic disease 09/17/2021   Lactic acidosis    Dehydration    Chronic diastolic heart failure ([place])    Pressure injury of skin 08/10/2021   Generalized weakness 08/09/2021   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   Pre-op evaluation 09/28/2020   Goals of care, counseling/discussion 08/27/2019   Pneumonia due to COVID-19 virus 08/05/2019   Pneumonia 08/03/2019   Suspected COVID-19 virus infection    Shortness of breath 08/02/2019   Leukopenia    Heme + stool    Decreased hemoglobin    Hydronephrosis with obstructing calculus 06/24/2019   Cholelithiasis 06/24/2019   Arthritis 07/12/2018   Chronic pain 07/12/2018   Hearing loss 07/12/2018   Lymphedema 07/12/2018   Muscle weakness 07/12/2018   Unsteady gait 07/12/2018   Morbid obesity ([place]) 07/08/2018   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 -  11/14/2017  Chronic diastolic HF (heart failure) (HCC)    Chronic venous insufficiency    Essential hypertension    Permanent atrial fibrillation ([place]); CHA2DS2-VASc score =5 (agex2, aortic plaque, CHF, HTN)    OSA on CPAP    Pulmonary hypertension ([place])    Hyperlipidemia with target LDL less than 100    PCP:  Lujean Amel, MD Pharmacy:   [facility] - [address] [place] [address] Phone:  [phone] Fax: [place] #[phone] - [address] [place] - [address] SEC OF [place] [address] [place] [address] Phone: [phone] Fax: [phone]  [facility] [place], [place] [facility] [address] [place] [address] Phone: [phone] Fax: [phone]     Social Determinants of Health (SDOH) Interventions    Readmission Risk Interventions No flowsheet data found.

## 2021-09-18 DIAGNOSIS — N178 Other acute kidney failure: Secondary | ICD-10-CM

## 2021-09-18 DIAGNOSIS — I5032 Chronic diastolic (congestive) heart failure: Secondary | ICD-10-CM | POA: Diagnosis not present

## 2021-09-18 DIAGNOSIS — N1832 Chronic kidney disease, stage 3b: Secondary | ICD-10-CM | POA: Diagnosis not present

## 2021-09-18 DIAGNOSIS — R6510 Systemic inflammatory response syndrome (SIRS) of non-infectious origin without acute organ dysfunction: Secondary | ICD-10-CM | POA: Diagnosis not present

## 2021-09-18 LAB — GLUCOSE, CAPILLARY
Glucose-Capillary: 105 mg/dL — ABNORMAL HIGH (ref 70–99)
Glucose-Capillary: 97 mg/dL (ref 70–99)
Glucose-Capillary: 94 mg/dL (ref 70–99)
Glucose-Capillary: 91 mg/dL (ref 70–99)
Glucose-Capillary: 92 mg/dL (ref 70–99)
Glucose-Capillary: 115 mg/dL — ABNORMAL HIGH (ref 70–99)
Glucose-Capillary: 121 mg/dL — ABNORMAL HIGH (ref 70–99)

## 2021-09-18 LAB — CBC
HCT: 28.7 % — ABNORMAL LOW (ref 39.0–52.0)
Hemoglobin: 9 g/dL — ABNORMAL LOW (ref 13.0–17.0)
MCH: 29.7 pg (ref 26.0–34.0)
MCHC: 31.4 g/dL (ref 30.0–36.0)
MCV: 94.7 fL (ref 80.0–100.0)
Platelets: 203 10*3/uL (ref 150–400)
RBC: 3.03 MIL/uL — ABNORMAL LOW (ref 4.22–5.81)
RDW: 15.9 % — ABNORMAL HIGH (ref 11.5–15.5)
WBC: 3.8 10*3/uL — ABNORMAL LOW (ref 4.0–10.5)
nRBC: 0 % (ref 0.0–0.2)

## 2021-09-18 LAB — BASIC METABOLIC PANEL
Anion gap: 8 (ref 5–15)
BUN: 29 mg/dL — ABNORMAL HIGH (ref 8–23)
CO2: 22 mmol/L (ref 22–32)
Calcium: 7.7 mg/dL — ABNORMAL LOW (ref 8.9–10.3)
Chloride: 110 mmol/L (ref 98–111)
Creatinine, Ser: 1.83 mg/dL — ABNORMAL HIGH (ref 0.61–1.24)
GFR, Estimated: 37 mL/min — ABNORMAL LOW (ref >60)
Glucose, Bld: 111 mg/dL — ABNORMAL HIGH (ref 70–99)
Potassium: 3.7 mmol/L (ref 3.5–5.1)
Sodium: 140 mmol/L (ref 135–145)

## 2021-09-18 LAB — URINE CULTURE

## 2021-09-18 MED ORDER — CEFEPIME HCL 2 G IJ SOLR
2.0000 g | Freq: Two times a day (BID) | INTRAMUSCULAR | Status: DC
  Administered 2021-09-18 – 2021-09-19 (×3): 2 g via INTRAVENOUS
  Filled 2021-09-18 (×3): qty 2

## 2021-09-18 MED ORDER — RIVAROXABAN 10 MG PO TABS
20.0000 mg | ORAL_TABLET | Freq: Every day | ORAL | Status: DC
  Administered 2021-09-18 – 2021-09-20 (×3): 20 mg via ORAL
  Filled 2021-09-18 (×3): qty 2

## 2021-09-18 NOTE — Progress Notes (Signed)
°  Progress Note   Patient: Patrick Bishop JAS:[id number] DOB: 03-23-1941 DOA: 09/16/2021     1 DOS: the patient was seen and examined on 09/18/2021       Brief hospital course: Patrick Bishop is an 80 y.o. 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  In the ER, lactate 7.2 and BP 70s but WBC normal, no fever.  CXR clear, CT abdomen showed left ureteral stent, otherwise mostly unremarkable.  BP initially 70s with EMS, only up to 90s with fluids.  Cr 3.0 mg/dL from baseline 1.9  12/23: Admitted to stepdown, fluids, antibiotics 12/24: BP improved, transfer out of stepdown     Assessment and Plan Acute on chronic renal failure ([place])- (present on admission) Cr >3 on admission.  With fluids today, down to 1.8.  -  Stop IVF - Trend Cr - Avoid nephrotoxins - Hold torsemide  SIRS, probably septic shock - Follow culture data -Continue cefepime   Chronic diastolic HF (heart failure) ([place])- (present on admission) Euvolemic - Hold torsemide, metoprolol  Pulmonary hypertension ([place])- (present on admission) Not on O2 at home.  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 Imaging here shows no evidence of progression. - Consult urology   Morbid obesity ([place])- (present on admission) BMI>35  OSA on CPAP CPAP noncompliant  Essential hypertension- (present on admission) BP still soft -Hold torsemide, metop  Pressure injury of left heel, stage 2 ([place])- (present on admission) -  WOC  Hyperlipidemia with target LDL less than 100- (present on admission) -Continue statin  Anemia of critical iillness      Subjective: Patient feels well.  No urinary symptoms, no confusion.     Objective Signs reviewed and remarkable for blood pressure still soft, heart rate controlled, pulse ox normal General appearance: Elderly adult male, lying in bed, nasal cannula in place, interactive, makes eye contact     HEENT: Anicteric, conjunctival pink, lids and lashes normal.  No nasal deformity, discharge, epistaxis Skin: No suspicious rashes or lesions.  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, present on arrival Cardiac: Irregularly irregular, no systolic murmurs,, no lower extremity edema, can see good wrinkles lines on the legs Respiratory: Respiratory rate and rhythm, lungs clear without rales or wheezes Abdomen: Abdomen soft Otterness palpation or guarding, no ascites or distention MSK:  Neuro: Extraocular movements intact, moves upper extremities with generalized weakness but symmetric strength, face symmetric, speech Psych: Attention normal, affect normal, judgment insight appear normal, oriented to person, place, and time   Data Reviewed: Creatinine down to 1.8  hgb down to 9, no clinical bleeding  Family Communication:  called daughter, no answer, spoke to partner by phone  Disposition: Status is: Inpatient  Remains inpatient appropriate because: still requiers IV antibiotics, likely SNF        Author: [name] 09/18/2021 8:11 AM  For on call review [email or web address].

## 2021-09-18 NOTE — Progress Notes (Signed)
Pt stable at time of transfer. Pt brought up to [facility] with belongings to new room. Report given.

## 2021-09-18 NOTE — Progress Notes (Signed)
RT inquired about cpap but pt states he has never worn any at home and doesn't want to start here. Advised to let the RN know if he changes his mind.

## 2021-09-18 NOTE — Progress Notes (Signed)
Pharmacy Antibiotic Note  Patrick Bishop is a 80 y.o. male admitted on 09/16/2021 with sepsis.  Pharmacy has been consulted for Vancomycin and Cefepime dosing.  In the ED patient received Vancomycin 2gm, Cefepime 2gm and Metronidazole 500mg  IV x 1 dose each.  Plan: Cefepime 2gm IV q12h  Weight: 121.3 kg (267 lb 6.7 oz)  Temp (24hrs), Avg:97.6 F (36.4 C), Min:97.4 F (36.3 C), Max:98 F (36.7 C)  Recent Labs  Lab 09/16/21 2035 09/17/21 0012 09/17/21 0312 09/17/21 0644 09/18/21 0255  WBC 9.1  --  6.0  --  3.8*  CREATININE 3.03*  --  2.45*  --  1.83*  LATICACIDVEN 7.2* 3.3* 2.8* 1.3  --      Estimated Creatinine Clearance: 43.2 mL/min (A) (by C-G formula based on SCr of 1.83 mg/dL (H)).    Allergies  Allergen Reactions   Aldactone [Spironolactone] Other (See Comments)    Unknown reaction    Antimicrobials this admission: 12/22 Vancomycin/Flagyl x1 12/22 Cefepime >>    Dose adjustments this admission: 12/24 Cefepime 2gm q24 > q12 Microbiology results: 12/22 BCx: ngtd 12/23 UCx: mult sp- final 12/23 MRSA PCR: neg   Thank you for allowing pharmacy to be a part of this patients care.  Minda Ditto, PharmD 09/18/2021 11:11 AM

## 2021-09-19 DIAGNOSIS — N1832 Chronic kidney disease, stage 3b: Secondary | ICD-10-CM | POA: Diagnosis not present

## 2021-09-19 DIAGNOSIS — I5032 Chronic diastolic (congestive) heart failure: Secondary | ICD-10-CM | POA: Diagnosis not present

## 2021-09-19 DIAGNOSIS — R6510 Systemic inflammatory response syndrome (SIRS) of non-infectious origin without acute organ dysfunction: Secondary | ICD-10-CM | POA: Diagnosis not present

## 2021-09-19 DIAGNOSIS — N178 Other acute kidney failure: Secondary | ICD-10-CM | POA: Diagnosis not present

## 2021-09-19 LAB — CBC
HCT: 27.6 % — ABNORMAL LOW (ref 39.0–52.0)
Hemoglobin: 8.8 g/dL — ABNORMAL LOW (ref 13.0–17.0)
MCH: 30 pg (ref 26.0–34.0)
MCHC: 31.9 g/dL (ref 30.0–36.0)
MCV: 94.2 fL (ref 80.0–100.0)
Platelets: 216 10*3/uL (ref 150–400)
RBC: 2.93 MIL/uL — ABNORMAL LOW (ref 4.22–5.81)
RDW: 16 % — ABNORMAL HIGH (ref 11.5–15.5)
WBC: 3.7 10*3/uL — ABNORMAL LOW (ref 4.0–10.5)
nRBC: 0 % (ref 0.0–0.2)

## 2021-09-19 LAB — GLUCOSE, CAPILLARY
Glucose-Capillary: 104 mg/dL — ABNORMAL HIGH (ref 70–99)
Glucose-Capillary: 122 mg/dL — ABNORMAL HIGH (ref 70–99)
Glucose-Capillary: 129 mg/dL — ABNORMAL HIGH (ref 70–99)
Glucose-Capillary: 131 mg/dL — ABNORMAL HIGH (ref 70–99)
Glucose-Capillary: 94 mg/dL (ref 70–99)
Glucose-Capillary: 99 mg/dL (ref 70–99)

## 2021-09-19 LAB — BASIC METABOLIC PANEL
Anion gap: 4 — ABNORMAL LOW (ref 5–15)
BUN: 21 mg/dL (ref 8–23)
CO2: 26 mmol/L (ref 22–32)
Calcium: 8 mg/dL — ABNORMAL LOW (ref 8.9–10.3)
Chloride: 111 mmol/L (ref 98–111)
Creatinine, Ser: 1.49 mg/dL — ABNORMAL HIGH (ref 0.61–1.24)
GFR, Estimated: 47 mL/min — ABNORMAL LOW (ref >60)
Glucose, Bld: 105 mg/dL — ABNORMAL HIGH (ref 70–99)
Potassium: 4.1 mmol/L (ref 3.5–5.1)
Sodium: 141 mmol/L (ref 135–145)

## 2021-09-19 NOTE — Progress Notes (Signed)
Pt refused CPAP qhs.  Pt encouraged to contact RT should he change his mind.   

## 2021-09-19 NOTE — Progress Notes (Signed)
Progress Note   Patient: Patrick Bishop SJG:[id number] DOB: Feb 02, 1941 DOA: 09/16/2021     2 DOS: the patient was seen and examined on 09/19/2021       Brief hospital course: Mr. Baugh is an 80 y.o. 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  In the ER, lactate 7.2 and BP 70s but WBC normal, no fever.  CXR clear, CT abdomen showed left ureteral stent, otherwise mostly unremarkable.  BP initially 70s with EMS, only up to 90s with fluids.  Cr 3.0 mg/dL from baseline 1.9    12/23: Admitted to stepdown, fluids, antibiotics 12/24: BP improved, transfer out of stepdown 12/25: Renal function normalized.     Assessment and Plan Acute on chronic renal failure ([place])- (present on admission) Cr down to 1.5.   - Trend Cr - Avoid nephrotoxins - Hold torsemide one more day  SIRS (systemic inflammatory response syndrome) ([place])- (present on admission) Not clear if this is sepsis.  Initial urine culture with multiple species.  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 Imaging here shows no evidence of progression. -Follow-up with urology as an outpatient  Morbid obesity ([place])- (present on  admission) BMI>35  OSA on CPAP CPAP noncompliant  Essential hypertension- (present on admission) Blood pressure normalized off meds -Hold torsemide, metop  Pressure injury of left heel, stage 2 ([place])- (present on admission) - WOC  Hyperlipidemia with target LDL less than 100- (present on admission) -Continue statin        Subjective: No new cough, dyspnea, swelling, urinary symptoms, confusion, hematuria, fever.   Objective Signs reviewed and remarkable for vital signs reviewed and unremarkable General appearance: Elderly adult male, lying in bed, nasal cannula in place, interactive, makes eye contact     HEENT: Anicteric, conjunctival pink, lids and lashes normal.  No nasal deformity, discharge, epistaxis Skin: No suspicious rashes or lesions.  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, present on arrival Cardiac: Irregularly irregular, no systolic murmurs,, no lower extremity edema, can see good wrinkles lines on the legs Respiratory: Respiratory rate and rhythm, lungs clear without rales or wheezes Abdomen: Abdomen soft Otterness palpation or guarding, no ascites or distention MSK:  Neuro: Extraocular movements intact, moves upper extremities with generalized weakness but symmetric strength, face symmetric, speech Psych: Attention normal, affect normal, judgment insight appear normal, oriented to person, place, and time   Data Reviewed: Labs notable for creatinine down to 1.4, hemoglobin stable, electrolytes normal  Family Communication: Spoke to partner at the bedside  Disposition: Status is: Inpatient  Remains inpatient appropriate because: Patient will require significant rehab to return to his level of function.  He is now medically stable for discharge, placement pending  Author: [name] 09/19/2021 2:55 PM  For on call review [email or web address].

## 2021-09-19 NOTE — TOC Progression Note (Signed)
Transition of Care [facility]) - Progression Note    Patient Details  Name: Patrick Bishop MRN: [id number] Date of Birth: April 13, 1941  Transition of Care [facility]) CM/SW Contact  Gerardine Peltz, Juliann Pulse, RN Phone Number: 09/19/2021, 3:11 PM  Clinical Narrative:  [name] to patient/Barbara(significant other) about d/c plans, & recc-declines SNF-return back to [facility] to [place] for HHPT/OT-rep [place] aware. Await [facility] orders.Patient has own transport home.     Expected Discharge Plan: [place] Barriers to Discharge: Continued Medical Work up  Expected Discharge Plan and Services Expected Discharge Plan: [place]   Discharge Planning Services: CM Consult Post Acute Care Choice: [place] Living arrangements for the past 2 months: [place]: PT, OT [facility] Agency: [facility] Date [place]: 09/19/21 Time [place]: 2518 Representative spoke with at [place]: [place] ([place]) Interventions    Readmission Risk Interventions No flowsheet data found.

## 2021-09-19 NOTE — Assessment & Plan Note (Addendum)
Hemoglobin stable 

## 2021-09-20 DIAGNOSIS — R6510 Systemic inflammatory response syndrome (SIRS) of non-infectious origin without acute organ dysfunction: Secondary | ICD-10-CM | POA: Diagnosis not present

## 2021-09-20 DIAGNOSIS — N178 Other acute kidney failure: Secondary | ICD-10-CM | POA: Diagnosis not present

## 2021-09-20 DIAGNOSIS — N1832 Chronic kidney disease, stage 3b: Secondary | ICD-10-CM | POA: Diagnosis not present

## 2021-09-20 DIAGNOSIS — I5032 Chronic diastolic (congestive) heart failure: Secondary | ICD-10-CM | POA: Diagnosis not present

## 2021-09-20 LAB — CBC
HCT: 27.7 % — ABNORMAL LOW (ref 39.0–52.0)
Hemoglobin: 8.9 g/dL — ABNORMAL LOW (ref 13.0–17.0)
MCH: 30 pg (ref 26.0–34.0)
MCHC: 32.1 g/dL (ref 30.0–36.0)
MCV: 93.3 fL (ref 80.0–100.0)
Platelets: 204 10*3/uL (ref 150–400)
RBC: 2.97 MIL/uL — ABNORMAL LOW (ref 4.22–5.81)
RDW: 16.2 % — ABNORMAL HIGH (ref 11.5–15.5)
WBC: 4.9 10*3/uL (ref 4.0–10.5)
nRBC: 0 % (ref 0.0–0.2)

## 2021-09-20 LAB — BASIC METABOLIC PANEL
Anion gap: 5 (ref 5–15)
BUN: 18 mg/dL (ref 8–23)
CO2: 24 mmol/L (ref 22–32)
Calcium: 7.9 mg/dL — ABNORMAL LOW (ref 8.9–10.3)
Chloride: 108 mmol/L (ref 98–111)
Creatinine, Ser: 1.51 mg/dL — ABNORMAL HIGH (ref 0.61–1.24)
GFR, Estimated: 46 mL/min — ABNORMAL LOW (ref >60)
Glucose, Bld: 121 mg/dL — ABNORMAL HIGH (ref 70–99)
Potassium: 3.7 mmol/L (ref 3.5–5.1)
Sodium: 137 mmol/L (ref 135–145)

## 2021-09-20 LAB — GLUCOSE, CAPILLARY
Glucose-Capillary: 121 mg/dL — ABNORMAL HIGH (ref 70–99)
Glucose-Capillary: 91 mg/dL (ref 70–99)
Glucose-Capillary: 129 mg/dL — ABNORMAL HIGH (ref 70–99)
Glucose-Capillary: 108 mg/dL — ABNORMAL HIGH (ref 70–99)
Glucose-Capillary: 125 mg/dL — ABNORMAL HIGH (ref 70–99)

## 2021-09-20 LAB — URINE CULTURE

## 2021-09-20 MED ORDER — SULFAMETHOXAZOLE-TRIMETHOPRIM 800-160 MG PO TABS
1.0000 | ORAL_TABLET | Freq: Two times a day (BID) | ORAL | Status: DC
  Administered 2021-09-20 – 2021-09-21 (×2): 1 via ORAL
  Filled 2021-09-20 (×2): qty 1

## 2021-09-20 MED ORDER — TORSEMIDE 20 MG PO TABS
20.0000 mg | ORAL_TABLET | Freq: Two times a day (BID) | ORAL | Status: DC
  Administered 2021-09-20 – 2021-09-21 (×2): 20 mg via ORAL
  Filled 2021-09-20 (×3): qty 1

## 2021-09-20 NOTE — NC FL2 (Signed)
[place] LEVEL OF CARE SCREENING TOOL     IDENTIFICATION  Patient Name: Patrick Bishop Birthdate: Jul 25, 1941 Sex: male Admission Date (Current Location): 09/16/2021  [facility] and [place] Number:  Herbalist and Address:  [facility],  [place], [place]      Provider Number: [id number]  Attending Physician Name and Address:  [name], *  Relative Name and Phone Number:  Significant other, Aldean Ast [phone].    Current Level of Care: Hospital Recommended Level of Care: [place] Prior Approval Number:    Date Approved/Denied:   PASRR Number: [id number]  Discharge Plan: SNF    Current Diagnoses: Patient Active Problem List   Diagnosis Date Noted   Sepsis ([place]) 09/17/2021   Diarrhea 09/17/2021   Acute on chronic renal failure ([place]) 09/17/2021   Pressure injury of left heel, stage 2 ([place]) 09/17/2021   SIRS (systemic inflammatory response syndrome) ([place]) 09/17/2021   Chronic respiratory failure with hypoxia ([place]) 09/17/2021   Anemia of chronic disease 09/17/2021   Lactic acidosis    Dehydration    Chronic diastolic heart failure (HCC)    Pressure injury of skin 08/10/2021   Generalized weakness 08/09/2021   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   Pre-op evaluation 09/28/2020   Goals of care, counseling/discussion 08/27/2019   Pneumonia due to COVID-19 virus 08/05/2019   Pneumonia 08/03/2019   Suspected COVID-19 virus infection    Shortness of breath 08/02/2019   Leukopenia    Heme + stool    Decreased hemoglobin    Hydronephrosis with obstructing calculus 06/24/2019   Cholelithiasis 06/24/2019   Arthritis 07/12/2018   Chronic pain 07/12/2018   Hearing loss 07/12/2018   Lymphedema 07/12/2018   Muscle weakness 07/12/2018   Unsteady gait 07/12/2018   Morbid obesity ([place]) 07/08/2018   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 -  11/14/2017    Chronic diastolic HF (heart failure) (HCC)    Chronic venous insufficiency    Essential hypertension    Permanent atrial fibrillation (HCC); CHA2DS2-VASc score =5 (agex2, aortic plaque, CHF, HTN)    OSA on CPAP    Pulmonary hypertension (HCC)    Hyperlipidemia with target LDL less than 100     Orientation RESPIRATION BLADDER Height & Weight     Self, Time, Situation, Place  Normal Incontinent Weight: 267 lb 6.7 oz (121.3 kg) Height:     BEHAVIORAL SYMPTOMS/MOOD NEUROLOGICAL BOWEL NUTRITION STATUS      Incontinent    AMBULATORY STATUS COMMUNICATION OF NEEDS Skin   Extensive Assist Verbally PU Stage and Appropriate Care   PU Stage 2 Dressing: Daily                   Personal Care Assistance Level of Assistance  Bathing, Dressing Bathing Assistance: Maximum assistance   Dressing Assistance: Limited assistance     Functional Limitations Info  Hearing   Hearing Info: Impaired      SPECIAL CARE FACTORS FREQUENCY  PT (By licensed PT), OT (By licensed OT)     PT Frequency: 5x/wk OT Frequency: 5x/wk            Contractures Contractures Info: Not present    Additional Factors Info  Code Status, Allergies, Insulin Sliding Scale Code Status Info: Full Allergies Info: Aldactone   Insulin Sliding Scale Info: see MAR       Current Medications (09/20/2021):  This is the current hospital active medication list  Current Facility-Administered Medications  Medication Dose Route Frequency Provider Last Rate Last Admin   acetaminophen (TYLENOL) tablet 650 mg  650 mg Oral Q6H PRN Doutova, Anastassia, MD       Or   acetaminophen (TYLENOL) suppository 650 mg  650 mg Rectal Q6H PRN Doutova, Anastassia, MD       atorvastatin (LIPITOR) tablet 10 mg  10 mg Oral Daily Doutova, Anastassia, MD   10 mg at 09/20/21 1045   feeding supplement (ENSURE ENLIVE / ENSURE PLUS) liquid 237 mL  237 mL Oral TID BM Danford, Christopher P, MD   237 mL at 09/20/21 1045   HYDROcodone-acetaminophen  (NORCO/VICODIN) 5-325 MG per tablet 1-2 tablet  1-2 tablet Oral Q4H PRN Toy Baker, MD   2 tablet at 09/20/21 1101   insulin aspart (novoLOG) injection 0-9 Units  0-9 Units Subcutaneous Q4H Doutova, Anastassia, MD   1 Units at 09/20/21 1253   MEDLINE mouth rinse  15 mL Mouth Rinse q12n4p Doutova, Anastassia, MD   15 mL at 09/17/21 1644   multivitamin with minerals tablet 1 tablet  1 tablet Oral Daily Danford, Suann Larry, MD   1 tablet at 09/20/21 1045   rivaroxaban (XARELTO) tablet 20 mg  20 mg Oral Q supper [name], MD   20 mg at 09/19/21 1745   sulfamethoxazole-trimethoprim (BACTRIM DS) 800-160 MG per tablet 1 tablet  1 tablet Oral Q12H Danford, Suann Larry, MD       torsemide (DEMADEX) tablet 20 mg  20 mg Oral BID Danford, Suann Larry, MD         Discharge Medications: Please see discharge summary for a list of discharge medications.  Relevant Imaging Results:  Relevant Lab Results:   Additional Information SS#[id number]  Lennart Pall, LCSW

## 2021-09-20 NOTE — Progress Notes (Signed)
°  Progress Note   Patient: Patrick Bishop QZE:[id number] DOB: May 23, 1941 DOA: 09/16/2021     3 DOS: the patient was seen and examined on 09/20/2021   Brief hospital course: Patrick Bishop is an 80 y.o. 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  In the ER, lactate 7.2 and BP 70s but WBC normal, no fever.  CXR clear, CT abdomen showed left ureteral stent, otherwise mostly unremarkable.  BP initially 70s with EMS, only up to 90s with fluids.  Cr 3.0 mg/dL from baseline 1.9     Assessment and Plan Acute on chronic renal failure ([place])- (present on admission) Cr 3.03 on admission, trended down to 1.5 now with fluids, stable at 1.5 since yesterday.   - Trend Cr - Avoid nephrotoxins - Resume home torsemide at lower dose  SIRS (systemic inflammatory response syndrome) ([place])- (present on admission) Not clear if this is sepsis.  Initial urine culture with multiple species.  Repeat pending. Urology consulted, recommended 14 days antibiotics and outpatietn stent exchange. 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 Imaging here shows no evidence of progression. -Follow-up with urology as an  outpatient  Morbid obesity ([place])- (present on admission) BMI>35  OSA on CPAP CPAP noncompliant  Essential hypertension- (present on admission) Blood pressure normalized off meds -Hold metoprolol - Resume torsmeide  Pressure injury of left heel, stage 2 ([place])- (present on admission) - WOC  Hyperlipidemia with target LDL less than 100- (present on admission) -Continue statin     Subjective: No fever, headache, flank pain, chills, vomiting, diarrhea.  Objective Vital signs were reviewed and unremarkable. General appearance: Elderly adult male, lying in bed, appears debilitated     HEENT: Anicteric, conjunctival pink, lids and lashes normal. Skin: No suspicious rashes or lesions. Cardiac: RRR, no murmurs, no lower extremity edema Respiratory: Normal respiratory rate and rhythm, lungs clear without rales wheezes Abdomen: Abdomen soft without tenderness palpation or guarding, no ascites or distention MSK:  Neuro: Extraocular movements intact, moves upper extremities with severe generalized weakness but symmetric strength, speech fluent Psych: Attention normal, affect normal   Data Reviewed: Base metabolic panel and complete blood counts reviewed and summarized above.  Family Communication: None at the bedside  Disposition: Status is: Inpatient  Remains inpatient appropriate because: Patient will require significant rehab to return to his prior level of function.  He is now medically stable for discharge, placement pending            Author: [name] 09/20/2021 3:16 PM  For on call review [email or web address].

## 2021-09-20 NOTE — Progress Notes (Signed)
Pt continues to refuse CPAP qhs. 

## 2021-09-20 NOTE — TOC Progression Note (Signed)
Transition of Care [facility]) - Progression Note    Patient Details  Name: Vartan Kerins MRN: [id number] Date of Birth: 06-Jan-1941  Transition of Care [facility]) CM/SW Contact  Lennart Pall, LCSW Phone Number: 09/20/2021, 3:48 PM  Clinical Narrative:    Met with pt and significant other, [name], this afternoon to further discuss his mobility and ADLs deficits and PT recs for SNF.  [name] reports that she is unable to provide physical assistance to patient but they are both reluctant to SNF plan.  Both feel pt's mobility would be fine "if they (snf) would give him his pain medicines."  Stressed to them that his debility may have some relation to pain but not the only cause.  Pt and [name] agree that he is not at a level to be managed at IL apt.  Are now agreed with SNF plan but do not wish to return to [facility] Will begin SNF bed search. MD and RN aware.   Expected Discharge Plan: [place] Barriers to Discharge: Continued Medical Work up  Expected Discharge Plan and Services Expected Discharge Plan: [place]   Discharge Planning Services: CM Consult Post Acute Care Choice: [place] Living arrangements for the past 2 months: [place]: PT, OT [facility] Agency: [place] Date [place]: 09/19/21 Time [place]: 3086 Representative spoke with at [place]: [place] ([place]) Interventions    Readmission Risk Interventions No flowsheet data found.

## 2021-09-21 DIAGNOSIS — I10 Essential (primary) hypertension: Secondary | ICD-10-CM | POA: Diagnosis not present

## 2021-09-21 DIAGNOSIS — N179 Acute kidney failure, unspecified: Secondary | ICD-10-CM | POA: Diagnosis not present

## 2021-09-21 DIAGNOSIS — N178 Other acute kidney failure: Secondary | ICD-10-CM | POA: Diagnosis not present

## 2021-09-21 DIAGNOSIS — R110 Nausea: Secondary | ICD-10-CM | POA: Diagnosis not present

## 2021-09-21 DIAGNOSIS — R531 Weakness: Secondary | ICD-10-CM | POA: Diagnosis not present

## 2021-09-21 DIAGNOSIS — L98419 Non-pressure chronic ulcer of buttock with unspecified severity: Secondary | ICD-10-CM | POA: Diagnosis not present

## 2021-09-21 DIAGNOSIS — I5032 Chronic diastolic (congestive) heart failure: Secondary | ICD-10-CM | POA: Diagnosis not present

## 2021-09-21 DIAGNOSIS — K219 Gastro-esophageal reflux disease without esophagitis: Secondary | ICD-10-CM | POA: Diagnosis not present

## 2021-09-21 DIAGNOSIS — I4891 Unspecified atrial fibrillation: Secondary | ICD-10-CM | POA: Diagnosis not present

## 2021-09-21 DIAGNOSIS — M6281 Muscle weakness (generalized): Secondary | ICD-10-CM | POA: Diagnosis not present

## 2021-09-21 DIAGNOSIS — K5900 Constipation, unspecified: Secondary | ICD-10-CM | POA: Diagnosis not present

## 2021-09-21 DIAGNOSIS — N1330 Unspecified hydronephrosis: Secondary | ICD-10-CM | POA: Diagnosis not present

## 2021-09-21 DIAGNOSIS — R197 Diarrhea, unspecified: Secondary | ICD-10-CM | POA: Diagnosis not present

## 2021-09-21 DIAGNOSIS — C670 Malignant neoplasm of trigone of bladder: Secondary | ICD-10-CM | POA: Diagnosis not present

## 2021-09-21 DIAGNOSIS — I4821 Permanent atrial fibrillation: Secondary | ICD-10-CM | POA: Diagnosis not present

## 2021-09-21 DIAGNOSIS — N189 Chronic kidney disease, unspecified: Secondary | ICD-10-CM | POA: Diagnosis not present

## 2021-09-21 DIAGNOSIS — Z7401 Bed confinement status: Secondary | ICD-10-CM | POA: Diagnosis not present

## 2021-09-21 DIAGNOSIS — N1832 Chronic kidney disease, stage 3b: Secondary | ICD-10-CM | POA: Diagnosis not present

## 2021-09-21 DIAGNOSIS — G894 Chronic pain syndrome: Secondary | ICD-10-CM | POA: Diagnosis not present

## 2021-09-21 DIAGNOSIS — Z79899 Other long term (current) drug therapy: Secondary | ICD-10-CM | POA: Diagnosis not present

## 2021-09-21 DIAGNOSIS — R2689 Other abnormalities of gait and mobility: Secondary | ICD-10-CM | POA: Diagnosis not present

## 2021-09-21 DIAGNOSIS — J9611 Chronic respiratory failure with hypoxia: Secondary | ICD-10-CM | POA: Diagnosis not present

## 2021-09-21 DIAGNOSIS — I272 Pulmonary hypertension, unspecified: Secondary | ICD-10-CM | POA: Diagnosis not present

## 2021-09-21 DIAGNOSIS — F4320 Adjustment disorder, unspecified: Secondary | ICD-10-CM | POA: Diagnosis not present

## 2021-09-21 DIAGNOSIS — N390 Urinary tract infection, site not specified: Secondary | ICD-10-CM | POA: Diagnosis not present

## 2021-09-21 DIAGNOSIS — D638 Anemia in other chronic diseases classified elsewhere: Secondary | ICD-10-CM | POA: Diagnosis not present

## 2021-09-21 DIAGNOSIS — Z743 Need for continuous supervision: Secondary | ICD-10-CM | POA: Diagnosis not present

## 2021-09-21 DIAGNOSIS — G8929 Other chronic pain: Secondary | ICD-10-CM | POA: Diagnosis not present

## 2021-09-21 DIAGNOSIS — N3000 Acute cystitis without hematuria: Secondary | ICD-10-CM | POA: Diagnosis not present

## 2021-09-21 DIAGNOSIS — G4730 Sleep apnea, unspecified: Secondary | ICD-10-CM | POA: Diagnosis not present

## 2021-09-21 DIAGNOSIS — R41841 Cognitive communication deficit: Secondary | ICD-10-CM | POA: Diagnosis not present

## 2021-09-21 DIAGNOSIS — C679 Malignant neoplasm of bladder, unspecified: Secondary | ICD-10-CM | POA: Diagnosis not present

## 2021-09-21 DIAGNOSIS — D649 Anemia, unspecified: Secondary | ICD-10-CM | POA: Diagnosis not present

## 2021-09-21 DIAGNOSIS — E785 Hyperlipidemia, unspecified: Secondary | ICD-10-CM | POA: Diagnosis not present

## 2021-09-21 DIAGNOSIS — Z23 Encounter for immunization: Secondary | ICD-10-CM | POA: Diagnosis not present

## 2021-09-21 DIAGNOSIS — M6259 Muscle wasting and atrophy, not elsewhere classified, multiple sites: Secondary | ICD-10-CM | POA: Diagnosis not present

## 2021-09-21 LAB — BASIC METABOLIC PANEL
Anion gap: 6 (ref 5–15)
BUN: 19 mg/dL (ref 8–23)
CO2: 27 mmol/L (ref 22–32)
Calcium: 7.9 mg/dL — ABNORMAL LOW (ref 8.9–10.3)
Chloride: 104 mmol/L (ref 98–111)
Creatinine, Ser: 1.4 mg/dL — ABNORMAL HIGH (ref 0.61–1.24)
GFR, Estimated: 50 mL/min — ABNORMAL LOW (ref >60)
Glucose, Bld: 122 mg/dL — ABNORMAL HIGH (ref 70–99)
Potassium: 3.8 mmol/L (ref 3.5–5.1)
Sodium: 137 mmol/L (ref 135–145)

## 2021-09-21 LAB — RESP PANEL BY RT-PCR (FLU A&B, COVID) ARPGX2
Influenza A by PCR: NEGATIVE
Influenza B by PCR: NEGATIVE
SARS Coronavirus 2 by RT PCR: NEGATIVE

## 2021-09-21 LAB — GLUCOSE, CAPILLARY
Glucose-Capillary: 105 mg/dL — ABNORMAL HIGH (ref 70–99)
Glucose-Capillary: 112 mg/dL — ABNORMAL HIGH (ref 70–99)
Glucose-Capillary: 115 mg/dL — ABNORMAL HIGH (ref 70–99)
Glucose-Capillary: 122 mg/dL — ABNORMAL HIGH (ref 70–99)

## 2021-09-21 MED ORDER — TORSEMIDE 20 MG PO TABS: 20.0000 mg | ORAL_TABLET | Freq: Two times a day (BID) | ORAL | Status: DC

## 2021-09-21 MED ORDER — HYDROCODONE-ACETAMINOPHEN 5-325 MG PO TABS: 1.0000 | ORAL_TABLET | Freq: Four times a day (QID) | ORAL | 0 refills | Status: AC | PRN

## 2021-09-21 MED ORDER — ENSURE ENLIVE PO LIQD: 237.0000 mL | Freq: Three times a day (TID) | ORAL | 12 refills | Status: AC

## 2021-09-21 MED ORDER — ACETAMINOPHEN 325 MG PO TABS: 650.0000 mg | ORAL_TABLET | Freq: Four times a day (QID) | ORAL | Status: DC | PRN

## 2021-09-21 MED ORDER — SULFAMETHOXAZOLE-TRIMETHOPRIM 800-160 MG PO TABS: 1.0000 | ORAL_TABLET | Freq: Two times a day (BID) | ORAL | Status: AC

## 2021-09-21 NOTE — Progress Notes (Signed)
Called report to [facility] at [employer].

## 2021-09-21 NOTE — TOC Transition Note (Addendum)
Transition of Care [facility]) - CM/SW Discharge Note   Patient Details  Name: Patrick Bishop MRN: [id number] Date of Birth: 15-Apr-1941  Transition of Care [facility]) CM/SW Contact:  Lennart Pall, LCSW Phone Number: 09/21/2021, 12:20 PM   Clinical Narrative:    Pt medically cleared for SNF dc today and pt has accepted bed at [facility].  PTAR called 12:15pm.  RN to call report to [phone].  No further TOC needs.   Final next level of care: Skilled Nursing Facility Barriers to Discharge: Barriers Resolved   Patient Goals and CMS Choice Patient states their goals for this hospitalization and ongoing recovery are:: to go back to rehab CMS Medicare.gov Compare Post Acute Care list provided to:: Patient Choice offered to / list presented to : Patient  Discharge Placement   Existing PASRR number confirmed : 09/20/21          Patient chooses bed at: [facility] Patient to be transferred to facility by: [place] Name of family member notified: significant other, [name] Patient and family notified of of transfer: 09/21/21  Discharge Plan and Services   Discharge Planning Services: CM Consult Post Acute Care Choice: [place]          DME Arranged: N/A DME Agency: NA       HH Arranged: NA HH Agency: NA Date HH Agency Contacted:  Time HH Agency Contacted:  Representative spoke with at [place]: NA  Social Determinants of Health ([place]) Interventions     Readmission Risk Interventions No flowsheet data found.

## 2021-09-21 NOTE — Discharge Summary (Signed)
Physician Discharge Summary   Patient: Patrick Bishop MRN: [id number] DOB: @DOB   Admit date:     09/16/2021  Discharge date: 09/21/21  Discharge Physician: [name]   PCP: Lujean Amel, MD   Recommendations at discharge: 1. Schedule follow up with Dr. Lovena Neighbours at [facility] in 10-14 days 2. Continue Bactrim for 10 days  3. 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 [place] is an 80 y.o. 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  In the ER, lactate 7.2 and BP 70s but WBC normal, no fever.  CXR clear, CT abdomen showed left ureteral stent, otherwise mostly unremarkable.  BP initially 70s with EMS, only up to 90s with fluids.  Cr 3.0 mg/dL from baseline 1.9       Acute on chronic renal failure ([place])- (present on admission) Cr 3.03 on admission, trended down to 1.5 with fluids.  Torsemide restarted at lower dose, no edema, Cr stable.   Check BMP in 1 week   Complicated urinary tract infection Sepsis ruled out, hypotensive from dehydration.    Urology consulted, recommended 14 days antibiotics and outpatient stent  exchange.  Treated with cefepime x3 days, urine culture x2 with multiple species, no DR organisms.  Discharged with Bactrim DS BID and Urology follow up    Chronic diastolic HF (heart failure) ([place])- (present on admission) Initially dehydrated, now improved   Anemia of chronic disease  Pulmonary hypertension ([place])- (present on admission) Weaned off O2   Permanent atrial fibrillation ([place]);  Continue Xarelto    Chronic pain- (present on admission) Continue Vicodin  Cancer of trigone of urinary bladder ([place])- (present on admission) Diagnosed last Jan, got Chemo, now in active surveillance. Imaging here shows no evidence of progression. -Follow-up with urology as an outpatient  Morbid obesity ([place])- (present on admission) BMI>35  OSA on CPAP Patient not on CPAP for long time, refused in hospital.  Given patient refusal, CPAP does not need to be continued in short term rehab  Essential hypertension- (present on admission) Blood pressure normalized off meds  Pressure injury of left heel, stage 2 ([place])- (present on admission)  Hyperlipidemia with target LDL less than 100- (present on admission)      Pain control - [place] Controlled Substance Reporting System database was reviewed    Consultants: Urology   Disposition: Skilled nursing facility Diet recommendation: Cardiac diet  DISCHARGE MEDICATION: Allergies as of 09/21/2021       Reactions   Aldactone [spironolactone] Other (See Comments)   Unknown reaction  Medication List     TAKE these medications    acetaminophen 325 MG tablet Commonly known as: TYLENOL Take 2 tablets (650 mg total) by mouth every 6 (six) hours as needed for mild pain (or Fever >/= 101).   atorvastatin 10 MG tablet Commonly known as: LIPITOR TAKE 1 TABLET DAILY   docusate sodium 100 MG capsule Commonly known as: COLACE Take 1 capsule (100 mg total) by mouth 2 (two) times daily.   feeding supplement Liqd Take  237 mLs by mouth 3 (three) times daily between meals.   Gerhardt's butt cream Crea Apply 1 application topically 3 (three) times daily.   HYDROcodone-acetaminophen 5-325 MG tablet Commonly known as: NORCO/VICODIN Take 1 tablet by mouth every 6 (six) hours as needed for moderate pain.   loperamide 2 MG tablet Commonly known as: IMODIUM A-D Take 2-4 mg by mouth 4 (four) times daily as needed for diarrhea or loose stools.   metoprolol tartrate 25 MG tablet Commonly known as: LOPRESSOR Take 25-37.5 mg by mouth See admin instructions. Take 25 mg by mouth in the morning then 37.5 (1.5 tablets) in the evening   pantoprazole 40 MG tablet Commonly known as: Protonix Take 1 tablet (40 mg total) by mouth 2 (two) times daily.   polyethylene glycol 17 g packet Commonly known as: MIRALAX / GLYCOLAX Take 17 g by mouth daily.   potassium chloride SA 20 MEQ tablet Commonly known as: KLOR-CON M Take 2 tablets (40 mEq total) by mouth daily.   prochlorperazine 10 MG tablet Commonly known as: COMPAZINE TAKE 1 TABLET(10 MG) BY MOUTH EVERY 6 HOURS AS NEEDED FOR NAUSEA OR VOMITING What changed: See the new instructions.   sulfamethoxazole-trimethoprim 800-160 MG tablet Commonly known as: BACTRIM DS Take 1 tablet by mouth 2 (two) times daily for 10 days.   tamsulosin 0.4 MG Caps capsule Commonly known as: FLOMAX Take 0.4 mg by mouth daily.   torsemide 20 MG tablet Commonly known as: DEMADEX Take 1 tablet (20 mg total) by mouth 2 (two) times daily. What changed:  how much to take how to take this when to take this additional instructions   Xarelto 20 MG Tabs tablet Generic drug: rivaroxaban Take 1 tablet (20 mg total) by mouth daily with supper.               Discharge Care Instructions  (From admission, onward)           Start     Ordered   09/21/21 0000  Discharge wound care:       Comments: Apply foam dressing to sacral wound.  Apply iodine to LEFT heel once daily,  allow to air dry, cover with simple bandage.  Float heel on pills.   09/21/21 1158            Follow-up Information     Ceasar Mons, MD. Schedule an appointment as soon as possible for a visit in 1 week(s).   Specialty: Urology Contact information: [address] [place] [address] [phone]         Lujean Amel, MD Follow up.   Specialty: Family Medicine Why: Make appointment 1 week after discharge from SNF Contact information: [place] [address] [phone]         [name], MD .   Specialty: Cardiology Contact information: [address] [place] [address] [phone]  Discharge Exam: Filed Weights   09/16/21 2023 09/17/21 0200  Weight: 108.9 kg 121.3 kg   General: Pt is alert, awake, not in acute distress, lying in bed, appears chronically ill and debilitated, weak Cardiovascular: RRR, nl S1-S2, no murmurs appreciated.   Trace nonpitting LE edema.   Respiratory: Normal respiratory rate and rhythm.  CTAB without rales or wheezes. Abdominal: Abdomen soft and non-tender.  No distension or HSM.   Neuro/Psych: Strength symmetric in upper and lower extremities.  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   Condition at discharge: stable  The results of significant diagnostics from this hospitalization (including imaging, microbiology, ancillary and laboratory) are listed below for reference.   Imaging Studies: CT Abdomen Pelvis Wo Contrast  Result Date: 09/16/2021 CLINICAL DATA:  Acute abdominal pain, possible sepsis, history of left ureteral stent EXAM: CT ABDOMEN AND PELVIS WITHOUT CONTRAST TECHNIQUE: Multidetector CT imaging of the abdomen and pelvis was performed following the standard protocol without IV contrast. COMPARISON:  08/11/2021 FINDINGS: Lower chest: No acute abnormality. Hepatobiliary: Liver is within normal  limits. Gallbladder is well distended. A few small dependent gallstones are seen 1 of which lies in the gallbladder neck. No inflammatory changes are seen. Pancreas: Unremarkable. No pancreatic ductal dilatation or surrounding inflammatory changes. Spleen: Normal in size without focal abnormality. Adrenals/Urinary Tract: Adrenal glands are within normal limits. Right kidney shows no focal abnormality. No renal calculi are seen. Left kidney demonstrates mild cortical thinning. Left hydronephrosis is noted with a ureteral stent in place. No definitive renal or ureteral stones are noted. The bladder is partially distended. Stomach/Bowel: The appendix is within normal limits. No obstructive or inflammatory changes of the colon are seen. Small bowel and stomach are within normal limits. Vascular/Lymphatic: Atherosclerotic calcifications are noted. Aneurysmal dilatation of the infrarenal aorta to 3.4 cm is noted. Bilateral iliac venous stents are noted. No significant lymphadenopathy is noted. Reproductive: Prostate is unremarkable. Other: No abdominal wall hernia or abnormality. No abdominopelvic ascites. Musculoskeletal: No acute or significant osseous findings. IMPRESSION: Left ureteral stent in place with persistent mild hydronephrosis. No calculi are seen. Cholelithiasis without inflammatory change although a stone is noted within the gallbladder neck. 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 Recommend follow-up every 3 years. Reference: J Am Coll Radiol 1601;09:[id number]. Electronically Signed   By: Inez Catalina M.D.   On: 09/16/2021 23:08   DG Chest Port 1 View  Result Date: 09/16/2021 CLINICAL DATA:  Weakness and decreased oral intake. EXAM: PORTABLE CHEST 1 VIEW COMPARISON:  None. FINDINGS: Mild, stable linear atelectasis is seen within the right lung base. There is stable elevation of the right hemidiaphragm. There is no evidence of a pleural effusion or pneumothorax. The heart size and mediastinal  contours are within normal limits. The visualized skeletal structures are unremarkable. IMPRESSION: Mild, stable right basilar linear atelectasis. Electronically Signed   By: Virgina Norfolk M.D.   On: 09/16/2021 21:18    Microbiology: Results for orders placed or performed during the hospital encounter of 09/16/21  Culture, blood (Routine X 2) w Reflex to ID Panel     Status: None (Preliminary result)   Collection Time: 09/16/21  8:34 PM   Specimen: Left Antecubital; Blood  Result Value Ref Range Status   Specimen Description   Final    LEFT ANTECUBITAL Performed at [facility] [address], [place], [place] [address]    Special Requests   Final    BOTTLES DRAWN AEROBIC AND ANAEROBIC Blood Culture results may  not be optimal due to an inadequate volume of blood received in culture bottles Performed at [facility] [address], [place], [place] [address]    Culture   Final    NO GROWTH 4 DAYS Performed at [facility], [place] [address] [place], [place] [address]    Report Status PENDING  Incomplete  Resp Panel by RT-PCR (Flu A&B, Covid) Nasopharyngeal Swab     Status: None   Collection Time: 09/16/21  8:43 PM   Specimen: Nasopharyngeal Swab; Nasopharyngeal(NP) swabs in vial transport medium  Result Value Ref Range Status   SARS Coronavirus 2 by RT PCR NEGATIVE NEGATIVE Final    Comment: (NOTE) SARS-CoV-2 target nucleic acids are NOT DETECTED.  The SARS-CoV-2 RNA is generally detectable in upper respiratory specimens during the acute phase of infection. The lowest concentration of SARS-CoV-2 viral copies this assay can detect is 138 copies/mL. A negative result does not preclude SARS-Cov-2 infection and should not be used as the sole basis for treatment or other patient management decisions. A negative result may occur with  improper specimen collection/handling, submission of specimen other than nasopharyngeal swab, presence of  viral mutation(s) within the areas targeted by this assay, and inadequate number of viral copies(<138 copies/mL). A negative result must be combined with clinical observations, patient history, and epidemiological information. The expected result is Negative.  Fact Sheet for Patients:  EntrepreneurPulse.com.au  Fact Sheet for Healthcare Providers:  IncredibleEmployment.be  This test is no t yet approved or cleared by the [place] FDA and  has been authorized for detection and/or diagnosis of SARS-CoV-2 by FDA under an Emergency Use Authorization (EUA). This EUA will remain  in effect (meaning this test can be used) for the duration of the COVID-19 declaration under Section 564(b)(1) of the Act, 21 U.S.C.section 360bbb-3(b)(1), unless the authorization is terminated  or revoked sooner.       Influenza A by PCR NEGATIVE NEGATIVE Final   Influenza B by PCR NEGATIVE NEGATIVE Final    Comment: (NOTE) The Xpert Xpress SARS-CoV-2/FLU/RSV plus assay is intended as an aid in the diagnosis of influenza from Nasopharyngeal swab specimens and should not be used as a sole basis for treatment. Nasal washings and aspirates are unacceptable for Xpert Xpress SARS-CoV-2/FLU/RSV testing.  Fact Sheet for Patients: EntrepreneurPulse.com.au  Fact Sheet for Healthcare Providers: IncredibleEmployment.be  This test is not yet approved or cleared by the [place] FDA and has been authorized for detection and/or diagnosis of SARS-CoV-2 by FDA under an Emergency Use Authorization (EUA). This EUA will remain in effect (meaning this test can be used) for the duration of the COVID-19 declaration under Section 564(b)(1) of the Act, 21 U.S.C. section 360bbb-3(b)(1), unless the authorization is terminated or revoked.  Performed at [facility], [place] [address] [place], [place] [address]   Culture, blood  (Routine X 2) w Reflex to ID Panel     Status: None (Preliminary result)   Collection Time: 09/16/21  9:28 PM   Specimen: BLOOD RIGHT HAND  Result Value Ref Range Status   Specimen Description   Final    BLOOD RIGHT HAND Performed at [place] [facility] [address], [place], [place] [address]    Special Requests   Final    BOTTLES DRAWN AEROBIC AND ANAEROBIC Blood Culture results may not be optimal due to an inadequate volume of blood received in culture bottles Performed at [facility] [address] [place], [place]  [address]    Culture   Final    NO GROWTH 4 DAYS Performed at [facility], [place] [address] [place], [place] [address]    Report Status PENDING  Incomplete  MRSA Next Gen by PCR, Nasal     Status: None   Collection Time: 09/17/21  2:02 AM   Specimen: Nasal Mucosa; Nasal Swab  Result Value Ref Range Status   MRSA by PCR Next Gen NOT DETECTED NOT DETECTED Final    Comment: (NOTE) The GeneXpert MRSA Assay (FDA approved for NASAL specimens only), is one component of a comprehensive MRSA colonization surveillance program. It is not intended to diagnose MRSA infection nor to guide or monitor treatment for MRSA infections. Test performance is not FDA approved in patients less than 10 years old. Performed at [facility], [place] [address] [place], [place] [address]   Urine Culture     Status: Abnormal   Collection Time: 09/17/21  5:22 AM   Specimen: In/Out Cath Urine  Result Value Ref Range Status   Specimen Description   Final    IN/OUT CATH URINE Performed at [facility] [address], [place], [place] [address]    Special Requests   Final    NONE Performed at [facility], [place] [address], [place], [place] [address]    Culture MULTIPLE SPECIES PRESENT, SUGGEST RECOLLECTION (A)  Final   Report Status 09/18/2021 FINAL  Final  Urine Culture     Status: Abnormal    Collection Time: 09/19/21  4:18 PM   Specimen: Urine, Clean Catch  Result Value Ref Range Status   Specimen Description   Final    URINE, CLEAN CATCH Performed at [facility], [place] [address] [place], [place] [address]    Special Requests   Final    NONE Performed at [facility], [place] [address], [place], [place] [address]    Culture MULTIPLE SPECIES PRESENT, SUGGEST RECOLLECTION (A)  Final   Report Status 09/20/2021 FINAL  Final    Labs: CBC: Recent Labs  Lab 09/16/21 2035 09/17/21 0312 09/18/21 0255 09/19/21 0457 09/20/21 0425  WBC 9.1 6.0 3.8* 3.7* 4.9  NEUTROABS 8.0* 5.0  --   --   --   HGB 11.2* 9.2* 9.0* 8.8* 8.9*  HCT 35.9* 29.1* 28.7* 27.6* 27.7*  MCV 95.2 92.4 94.7 94.2 93.3  PLT 288 228 203 216 970   Basic Metabolic Panel: Recent Labs  Lab 09/17/21 0312 09/18/21 0255 09/19/21 0457 09/20/21 0425 09/21/21 0423  NA 137 140 141 137 137  K 4.2 3.7 4.1 3.7 3.8  CL 105 110 111 108 104  CO2 22 22 26 24 27   GLUCOSE 101* 111* 105* 121* 122*  BUN 36* 29* 21 18 19   CREATININE 2.45* 1.83* 1.49* 1.51* 1.40*  CALCIUM 8.1* 7.7* 8.0* 7.9* 7.9*  MG 1.9  --   --   --   --   PHOS 3.6  --   --   --   --    Liver Function Tests: Recent Labs  Lab 09/16/21 2035 09/17/21 0312  AST 20 18   19   ALT 12 11   10   ALKPHOS 74 60   60  BILITOT 1.1 1.0   1.0  PROT 7.4 5.9*   5.8*  ALBUMIN 3.3* 2.7*   2.5*   CBG: Recent Labs  Lab 09/20/21 1609 09/20/21 1954 09/21/21 0012 09/21/21 0419 09/21/21 0735  GLUCAP 108* 125*  112* 115* 105*    Discharge time spent: less than 30 minutes.  Signed:  [name] MD.  [facility] 09/21/2021

## 2021-09-22 DIAGNOSIS — R110 Nausea: Secondary | ICD-10-CM | POA: Diagnosis not present

## 2021-09-22 DIAGNOSIS — G8929 Other chronic pain: Secondary | ICD-10-CM | POA: Diagnosis not present

## 2021-09-22 DIAGNOSIS — N390 Urinary tract infection, site not specified: Secondary | ICD-10-CM | POA: Diagnosis not present

## 2021-09-22 DIAGNOSIS — K5900 Constipation, unspecified: Secondary | ICD-10-CM | POA: Diagnosis not present

## 2021-09-22 DIAGNOSIS — K219 Gastro-esophageal reflux disease without esophagitis: Secondary | ICD-10-CM | POA: Diagnosis not present

## 2021-09-22 DIAGNOSIS — G4730 Sleep apnea, unspecified: Secondary | ICD-10-CM | POA: Diagnosis not present

## 2021-09-22 DIAGNOSIS — R197 Diarrhea, unspecified: Secondary | ICD-10-CM | POA: Diagnosis not present

## 2021-09-22 DIAGNOSIS — N179 Acute kidney failure, unspecified: Secondary | ICD-10-CM | POA: Diagnosis not present

## 2021-09-22 DIAGNOSIS — N1832 Chronic kidney disease, stage 3b: Secondary | ICD-10-CM | POA: Diagnosis not present

## 2021-09-22 DIAGNOSIS — D649 Anemia, unspecified: Secondary | ICD-10-CM | POA: Diagnosis not present

## 2021-09-22 DIAGNOSIS — C679 Malignant neoplasm of bladder, unspecified: Secondary | ICD-10-CM | POA: Diagnosis not present

## 2021-09-22 DIAGNOSIS — I10 Essential (primary) hypertension: Secondary | ICD-10-CM | POA: Diagnosis not present

## 2021-09-22 DIAGNOSIS — E785 Hyperlipidemia, unspecified: Secondary | ICD-10-CM | POA: Diagnosis not present

## 2021-09-22 DIAGNOSIS — I4891 Unspecified atrial fibrillation: Secondary | ICD-10-CM | POA: Diagnosis not present

## 2021-09-22 LAB — CULTURE, BLOOD (ROUTINE X 2)
Culture: NO GROWTH
Culture: NO GROWTH

## 2021-09-23 DIAGNOSIS — L98419 Non-pressure chronic ulcer of buttock with unspecified severity: Secondary | ICD-10-CM | POA: Diagnosis not present

## 2021-09-29 ENCOUNTER — Other Ambulatory Visit: Payer: Self-pay | Admitting: *Deleted

## 2021-09-29 NOTE — Patient Outreach (Signed)
[place] Coordinator follow up. Per [facility] Mr. Forstrom resides in [facility] SNF. Screened for potential [facility] Care Management services.   Spoke with Antoine Primas SNF SW who reports Mr. Winebarger transition plan is to return to ILF post SNF.   Will collaborate with facility SW and follow up with resident as appropriate.  Will continue to follow for potential [facility] needs and transition plans while member resides in SNF.    Marthenia Rolling, MSN, RN,BSN [place] Acute Care Coordinator [phone] [facility]) [phone]  (Toll free office)

## 2021-09-30 ENCOUNTER — Encounter: Payer: Self-pay | Admitting: Oncology

## 2021-09-30 DIAGNOSIS — C679 Malignant neoplasm of bladder, unspecified: Secondary | ICD-10-CM | POA: Diagnosis not present

## 2021-09-30 DIAGNOSIS — J9611 Chronic respiratory failure with hypoxia: Secondary | ICD-10-CM | POA: Diagnosis not present

## 2021-09-30 DIAGNOSIS — I4891 Unspecified atrial fibrillation: Secondary | ICD-10-CM | POA: Diagnosis not present

## 2021-09-30 DIAGNOSIS — D649 Anemia, unspecified: Secondary | ICD-10-CM | POA: Diagnosis not present

## 2021-09-30 DIAGNOSIS — E785 Hyperlipidemia, unspecified: Secondary | ICD-10-CM | POA: Diagnosis not present

## 2021-09-30 DIAGNOSIS — I5032 Chronic diastolic (congestive) heart failure: Secondary | ICD-10-CM | POA: Diagnosis not present

## 2021-09-30 DIAGNOSIS — I10 Essential (primary) hypertension: Secondary | ICD-10-CM | POA: Diagnosis not present

## 2021-10-01 DIAGNOSIS — I4891 Unspecified atrial fibrillation: Secondary | ICD-10-CM | POA: Diagnosis not present

## 2021-10-01 DIAGNOSIS — J9611 Chronic respiratory failure with hypoxia: Secondary | ICD-10-CM | POA: Diagnosis not present

## 2021-10-01 DIAGNOSIS — C679 Malignant neoplasm of bladder, unspecified: Secondary | ICD-10-CM | POA: Diagnosis not present

## 2021-10-01 DIAGNOSIS — G894 Chronic pain syndrome: Secondary | ICD-10-CM | POA: Diagnosis not present

## 2021-10-01 DIAGNOSIS — I5032 Chronic diastolic (congestive) heart failure: Secondary | ICD-10-CM | POA: Diagnosis not present

## 2021-10-01 DIAGNOSIS — I10 Essential (primary) hypertension: Secondary | ICD-10-CM | POA: Diagnosis not present

## 2021-10-05 DIAGNOSIS — J9611 Chronic respiratory failure with hypoxia: Secondary | ICD-10-CM | POA: Diagnosis not present

## 2021-10-05 DIAGNOSIS — I5032 Chronic diastolic (congestive) heart failure: Secondary | ICD-10-CM | POA: Diagnosis not present

## 2021-10-05 DIAGNOSIS — I4891 Unspecified atrial fibrillation: Secondary | ICD-10-CM | POA: Diagnosis not present

## 2021-10-05 DIAGNOSIS — D649 Anemia, unspecified: Secondary | ICD-10-CM | POA: Diagnosis not present

## 2021-10-05 DIAGNOSIS — I10 Essential (primary) hypertension: Secondary | ICD-10-CM | POA: Diagnosis not present

## 2021-10-05 DIAGNOSIS — N1832 Chronic kidney disease, stage 3b: Secondary | ICD-10-CM | POA: Diagnosis not present

## 2021-10-06 ENCOUNTER — Other Ambulatory Visit: Payer: Self-pay | Admitting: *Deleted

## 2021-10-06 NOTE — Patient Outreach (Signed)
[place] Coordinator follow up. Mr. Panuco resides in [facility] SNF. Screened for potential [facility] Care Management needs as a benefit of Mr. [employer] insurance plan.  Facility site visit to [employer]. Met with Antoine Primas, SNF SW to discuss member's transition plan and potential [facility] needs. Antoine Primas reports transition plan is to return to [place] with his significant other [name]. Mr. Ayotte is requiring a hoyer lift. States family plans to hire private caregivers. He will have home health arranged as well.   Went to Mr. Klann's room and spoke with him and his significant other Aldean Ast. Mr. Gascoigne gave writer permission to speak in front of [name] about transition plans and [facility] services. [name] and Mr. Blanchfield endorse Mr. Moll is very hard of hearing.   Confirmed PCP is Dr. Dorthy Cooler with [facility] at [place]. Confirmed transition plan to return to ILF. [name] reports she is in the process of securing paid caregiver assistance. States she is speaking with someone who is CNA. No concerns with transportation at this time.   Mr. Och endorses he has chronic pain from arthritis which hinders his mobility. States he sees a pain specialist. Discussed that they are going to require quite a bit of assistance at home since Mr. Gwinn requires a Civil Service fast streamer. Both express understanding. Mr. Willet states he will feel better when he gets back home. States he misses the social interaction at [employer].   Mr. Meisenheimer reports he has motorized wheelchair, chair lift, walker, cane at home. SNF SW to order additional DME that is needed. May need hoyer lift and will need bedside commode.   Discussed [facility] Care Management services. Mr. Gronau is agreeable. Verbal consent obtained. Discussed [facility] Care Management will not interfere or replace services provided by home health. Discussed Probation officer will make referral for [place] for complex case  management upon SNF transition.   Provided [facility] Care Management folder, 24-hr nurse advice line, and contact information.   Mr. Bozard and significant other express appreciation of visit and [facility] services. Aldean Ast states she can be contacted since Mr. Ehle is hard of hearing. They live together. Barbara's mobile number is [phone]. Mr. Matters agreeable to this.   Will continue to follow and make [facility] Care Management RNCM referral upon SNF transition. Will continue to collaborate with SNF SW.     Marthenia Rolling, MSN, RN,BSN [place] Acute Care Coordinator [phone] [facility]) [phone]  (Toll free office)

## 2021-10-07 ENCOUNTER — Inpatient Hospital Stay: Payer: Medicare Other | Attending: Oncology

## 2021-10-07 ENCOUNTER — Other Ambulatory Visit: Payer: Self-pay

## 2021-10-07 ENCOUNTER — Inpatient Hospital Stay (HOSPITAL_BASED_OUTPATIENT_CLINIC_OR_DEPARTMENT_OTHER): Payer: Medicare Other | Admitting: Oncology

## 2021-10-07 VITALS — BP 90/58 | HR 72 | Temp 97.3°F | Resp 17

## 2021-10-07 DIAGNOSIS — C679 Malignant neoplasm of bladder, unspecified: Secondary | ICD-10-CM

## 2021-10-07 DIAGNOSIS — N189 Chronic kidney disease, unspecified: Secondary | ICD-10-CM | POA: Diagnosis not present

## 2021-10-07 DIAGNOSIS — Z79899 Other long term (current) drug therapy: Secondary | ICD-10-CM | POA: Insufficient documentation

## 2021-10-07 DIAGNOSIS — C670 Malignant neoplasm of trigone of bladder: Secondary | ICD-10-CM | POA: Diagnosis not present

## 2021-10-07 DIAGNOSIS — N1330 Unspecified hydronephrosis: Secondary | ICD-10-CM | POA: Insufficient documentation

## 2021-10-07 LAB — CBC WITH DIFFERENTIAL (CANCER CENTER ONLY)
Abs Immature Granulocytes: 0.03 10*3/uL (ref 0.00–0.07)
Basophils Absolute: 0 10*3/uL (ref 0.0–0.1)
Basophils Relative: 1 %
Eosinophils Absolute: 0.1 10*3/uL (ref 0.0–0.5)
Eosinophils Relative: 1 %
HCT: 32.4 % — ABNORMAL LOW (ref 39.0–52.0)
Hemoglobin: 10 g/dL — ABNORMAL LOW (ref 13.0–17.0)
Immature Granulocytes: 1 %
Lymphocytes Relative: 14 %
Lymphs Abs: 0.6 10*3/uL — ABNORMAL LOW (ref 0.7–4.0)
MCH: 29.4 pg (ref 26.0–34.0)
MCHC: 30.9 g/dL (ref 30.0–36.0)
MCV: 95.3 fL (ref 80.0–100.0)
Monocytes Absolute: 0.3 10*3/uL (ref 0.1–1.0)
Monocytes Relative: 8 %
Neutro Abs: 3.1 10*3/uL (ref 1.7–7.7)
Neutrophils Relative %: 75 %
Platelet Count: 255 10*3/uL (ref 150–400)
RBC: 3.4 MIL/uL — ABNORMAL LOW (ref 4.22–5.81)
RDW: 18.1 % — ABNORMAL HIGH (ref 11.5–15.5)
WBC Count: 4.2 10*3/uL (ref 4.0–10.5)
nRBC: 0 % (ref 0.0–0.2)

## 2021-10-07 LAB — CMP (CANCER CENTER ONLY)
ALT: 9 U/L (ref 0–44)
AST: 10 U/L — ABNORMAL LOW (ref 15–41)
Albumin: 3.3 g/dL — ABNORMAL LOW (ref 3.5–5.0)
Alkaline Phosphatase: 83 U/L (ref 38–126)
Anion gap: 8 (ref 5–15)
BUN: 23 mg/dL (ref 8–23)
CO2: 23 mmol/L (ref 22–32)
Calcium: 8.6 mg/dL — ABNORMAL LOW (ref 8.9–10.3)
Chloride: 108 mmol/L (ref 98–111)
Creatinine: 2.53 mg/dL — ABNORMAL HIGH (ref 0.61–1.24)
GFR, Estimated: 25 mL/min — ABNORMAL LOW (ref >60)
Glucose, Bld: 126 mg/dL — ABNORMAL HIGH (ref 70–99)
Potassium: 5.5 mmol/L — ABNORMAL HIGH (ref 3.5–5.1)
Sodium: 139 mmol/L (ref 135–145)
Total Bilirubin: 0.7 mg/dL (ref 0.3–1.2)
Total Protein: 6.8 g/dL (ref 6.5–8.1)

## 2021-10-07 NOTE — Progress Notes (Signed)
Hematology and Oncology Follow Up Visit  Patrick Bishop [id number] 1941-09-03 81 y.o. 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     Prior Therapy:  He is status post TURBT completed on October 21, 2020 which showed high-grade urothelial carcinoma with muscle invasion.  Radiation and weekly carboplatin started on December 24, 2020.  He completed 6 cycles of carboplatin on Jan 27, 2021.    Current therapy: Active surveillance.  Interim History: Patrick Bishop returns today for a follow-up visit.  He reports no major changes in his health.  He is currently residing at skilled nursing facility rehabilitation.  His performance status is limited but has not reported any urinary symptoms.  He denies any hematuria dysuria.  He does report occasional flank pain associated with his stent.     Medications: Updated on review. Current Outpatient Medications  Medication Sig Dispense Refill   acetaminophen (TYLENOL) 325 MG tablet Take 2 tablets (650 mg total) by mouth every 6 (six) hours as needed for mild pain (or Fever >/= 101).     atorvastatin (LIPITOR) 10 MG tablet TAKE 1 TABLET DAILY (Patient taking differently: Take 10 mg by mouth daily.) 90 tablet 3   docusate sodium (COLACE) 100 MG capsule Take 1 capsule (100 mg total) by mouth 2 (two) times daily. 10 capsule 0   feeding supplement (ENSURE ENLIVE / ENSURE PLUS) LIQD Take 237 mLs by mouth 3 (three) times daily between meals. 237 mL 12   HYDROcodone-acetaminophen (NORCO/VICODIN) 5-325 MG tablet Take 1 tablet by mouth every 6 (six) hours as needed for moderate pain. 12 tablet 0   loperamide (IMODIUM A-D) 2 MG tablet Take 2-4 mg by mouth 4 (four) times daily as needed for diarrhea or loose stools.     metoprolol tartrate (LOPRESSOR) 25 MG tablet Take 25-37.5 mg by mouth See admin instructions. Take 25 mg by mouth in  the morning then 37.5 (1.5 tablets) in the evening     Nystatin (GERHARDT'S BUTT CREAM) CREA Apply 1 application topically 3 (three) times daily.     pantoprazole (PROTONIX) 40 MG tablet Take 1 tablet (40 mg total) by mouth 2 (two) times daily. 60 tablet 11   polyethylene glycol (MIRALAX / GLYCOLAX) 17 g packet Take 17 g by mouth daily.     potassium chloride SA (KLOR-CON) 20 MEQ tablet Take 2 tablets (40 mEq total) by mouth daily. 30 tablet 0   prochlorperazine (COMPAZINE) 10 MG tablet TAKE 1 TABLET(10 MG) BY MOUTH EVERY 6 HOURS AS NEEDED FOR NAUSEA OR VOMITING (Patient taking differently: Take 10 mg by mouth every 6 (six) hours as needed for nausea or vomiting.) 30 tablet 0   tamsulosin (FLOMAX) 0.4 MG CAPS capsule Take 0.4 mg by mouth daily.     torsemide (DEMADEX) 20 MG tablet Take 1 tablet (20 mg total) by mouth 2 (two) times daily.     XARELTO 20 MG TABS tablet Take 1 tablet (20 mg total) by mouth daily with supper. 90 tablet 3   No current facility-administered medications for this visit.     Allergies:  Allergies  Allergen Reactions   Aldactone [Spironolactone] Other (See Comments)    Unknown reaction       Physical Exam:   ECOG:  2   General appearance: Alert, awake without any distress. Head: Atraumatic without abnormalities Oropharynx: Without any thrush or ulcers. Eyes: No scleral icterus. Lymph nodes:  No lymphadenopathy noted in the cervical, supraclavicular, or axillary nodes Heart:regular rate and rhythm, without any murmurs or gallops.   Lung: Clear to auscultation without any rhonchi, wheezes or dullness to percussion. Abdomin: Soft, nontender without any shifting dullness or ascites. Musculoskeletal: No clubbing or cyanosis. Neurological: No motor or sensory deficits. Skin: No rashes or lesions.     Lab Results: Lab Results  Component Value Date   WBC 4.2 10/07/2021   HGB 10.0 (L) 10/07/2021   HCT 32.4 (L) 10/07/2021   MCV 95.3 10/07/2021   PLT  255 10/07/2021     Chemistry      Component Value Date/Time   NA 137 09/21/2021 0423   NA 141 07/22/2021 1230   K 3.8 09/21/2021 0423   CL 104 09/21/2021 0423   CO2 27 09/21/2021 0423   BUN 19 09/21/2021 0423   BUN 36 (H) 07/22/2021 1230   CREATININE 1.40 (H) 09/21/2021 0423   CREATININE 2.03 (H) 04/06/2021 0945      Component Value Date/Time   CALCIUM 7.9 (L) 09/21/2021 0423   ALKPHOS 60 09/17/2021 0312   ALKPHOS 60 09/17/2021 0312   AST 19 09/17/2021 0312   AST 18 09/17/2021 0312   AST 10 (L) 04/06/2021 0945   ALT 10 09/17/2021 0312   ALT 11 09/17/2021 0312   ALT 6 04/06/2021 0945   BILITOT 1.0 09/17/2021 0312   BILITOT 1.0 09/17/2021 0312   BILITOT 0.8 04/06/2021 0945          Impression and Plan:   81 year old man with:   1.   Bladder cancer diagnosed in January 2022.  He was found to have T2N0 high-grade urothelial carcinoma o at that time.[name]  He continues to be on active surveillance at this time without any evidence of relapsed disease.  He continues to get active surveillance and routine cystoscopy by Dr. Lovena Neighbours and stent replacement.  He has no clear-cut evidence of relapsed disease.  The natural course of this disease was reviewed and the role for additional systemic therapy were discussed which would only be used if he has metastatic disease in the future.       2.  Chronic renal insufficiency: His kidney function remained overall stable with creatinine clearance at baseline.  3.  Hydronephrosis: He continues to follow with Dr. Lovena Neighbours regarding this issue with a stent in place.   4.  Follow-up: He will follow-up as needed.  He will continue to have ongoing surveillance by Dr. Lovena Neighbours.   30  minutes were dedicated to this visit.  The time was spent on reviewing laboratory data, disease status update, treatment choices and future plan of care discussion.    Zola Button, MD 1/12/202310:13 AM

## 2021-10-08 DIAGNOSIS — I10 Essential (primary) hypertension: Secondary | ICD-10-CM | POA: Diagnosis not present

## 2021-10-08 DIAGNOSIS — I5032 Chronic diastolic (congestive) heart failure: Secondary | ICD-10-CM | POA: Diagnosis not present

## 2021-10-08 DIAGNOSIS — C679 Malignant neoplasm of bladder, unspecified: Secondary | ICD-10-CM | POA: Diagnosis not present

## 2021-10-08 DIAGNOSIS — I4891 Unspecified atrial fibrillation: Secondary | ICD-10-CM | POA: Diagnosis not present

## 2021-10-08 DIAGNOSIS — J9611 Chronic respiratory failure with hypoxia: Secondary | ICD-10-CM | POA: Diagnosis not present

## 2021-10-11 ENCOUNTER — Other Ambulatory Visit: Payer: Self-pay | Admitting: *Deleted

## 2021-10-11 ENCOUNTER — Encounter: Payer: Self-pay | Admitting: Oncology

## 2021-10-11 DIAGNOSIS — C670 Malignant neoplasm of trigone of bladder: Secondary | ICD-10-CM | POA: Diagnosis not present

## 2021-10-11 DIAGNOSIS — N1330 Unspecified hydronephrosis: Secondary | ICD-10-CM | POA: Diagnosis not present

## 2021-10-11 DIAGNOSIS — Z960 Presence of urogenital implants: Secondary | ICD-10-CM | POA: Diagnosis not present

## 2021-10-11 DIAGNOSIS — G4730 Sleep apnea, unspecified: Secondary | ICD-10-CM | POA: Diagnosis not present

## 2021-10-11 DIAGNOSIS — J9611 Chronic respiratory failure with hypoxia: Secondary | ICD-10-CM | POA: Diagnosis not present

## 2021-10-11 DIAGNOSIS — Z79891 Long term (current) use of opiate analgesic: Secondary | ICD-10-CM | POA: Diagnosis not present

## 2021-10-11 DIAGNOSIS — Z9181 History of falling: Secondary | ICD-10-CM | POA: Diagnosis not present

## 2021-10-11 DIAGNOSIS — Z4800 Encounter for change or removal of nonsurgical wound dressing: Secondary | ICD-10-CM | POA: Diagnosis not present

## 2021-10-11 DIAGNOSIS — Z993 Dependence on wheelchair: Secondary | ICD-10-CM | POA: Diagnosis not present

## 2021-10-11 DIAGNOSIS — I272 Pulmonary hypertension, unspecified: Secondary | ICD-10-CM | POA: Diagnosis not present

## 2021-10-11 DIAGNOSIS — L89623 Pressure ulcer of left heel, stage 3: Secondary | ICD-10-CM | POA: Diagnosis not present

## 2021-10-11 DIAGNOSIS — Z7901 Long term (current) use of anticoagulants: Secondary | ICD-10-CM | POA: Diagnosis not present

## 2021-10-11 DIAGNOSIS — I129 Hypertensive chronic kidney disease with stage 1 through stage 4 chronic kidney disease, or unspecified chronic kidney disease: Secondary | ICD-10-CM | POA: Diagnosis not present

## 2021-10-11 DIAGNOSIS — I5032 Chronic diastolic (congestive) heart failure: Secondary | ICD-10-CM

## 2021-10-11 DIAGNOSIS — L89152 Pressure ulcer of sacral region, stage 2: Secondary | ICD-10-CM | POA: Diagnosis not present

## 2021-10-11 DIAGNOSIS — G8929 Other chronic pain: Secondary | ICD-10-CM | POA: Diagnosis not present

## 2021-10-11 DIAGNOSIS — N179 Acute kidney failure, unspecified: Secondary | ICD-10-CM | POA: Diagnosis not present

## 2021-10-11 DIAGNOSIS — N189 Chronic kidney disease, unspecified: Secondary | ICD-10-CM | POA: Diagnosis not present

## 2021-10-11 DIAGNOSIS — Z87440 Personal history of urinary (tract) infections: Secondary | ICD-10-CM | POA: Diagnosis not present

## 2021-10-11 DIAGNOSIS — I4891 Unspecified atrial fibrillation: Secondary | ICD-10-CM | POA: Diagnosis not present

## 2021-10-11 DIAGNOSIS — E669 Obesity, unspecified: Secondary | ICD-10-CM | POA: Diagnosis not present

## 2021-10-11 NOTE — Patient Outreach (Signed)
[facility]) Care Management  10/11/2021  Patrick Bishop 1941-02-02 [id number]  Referral received 1/16 Transition of Care Initial Outreach 1/16  RN attempted outreach today however unsuccessful. RN able to leave a HIPAA approved voice message requesting a call back.  Will attempted another outreach call within the next week.  Raina Mina, RN Care Management Coordinator [place] Office [phone]

## 2021-10-11 NOTE — Patient Outreach (Signed)
[facility]) Care Management  10/11/2021  Daichi Moris Nov 10, 1940 [id number]   Hospital referral from [facility], RN for [place] (central) for complex case management. Assigned to Raina Mina, RN Care Coordinator for follow up.  Thank you, [place] Care Management Assistant

## 2021-10-11 NOTE — Patient Outreach (Addendum)
[facility] Post- Acute Care Coordinator follow up. Member screened for potential [facility] services as a a benefit of [name]  insurance plan.   Verified in [facility] that [name] transitioned from [facility] to [facility] on 10/10/21.   [name] previously agreeable to [facility] services post SNF.   Will make [place] Management referral for [facility] for complex case management.  [name] has medical history of CHF, HTN, afib, bladder cancer.  [name] is hard of hearing. Lives with significant other Patrick Bishop [phone] who should be contacted if unable to reach [name].   Awaiting to hear back from SNF SW regarding home health agency name.   1111 Addendum: Patrick Bishop SNF SW reports home health was arranged thru [facility] ([facility]).  Patrick Rolling, MSN, RN,BSN [place] Acute Care Coordinator [phone] [facility]) [phone]  (Toll free office)

## 2021-10-13 DIAGNOSIS — J9611 Chronic respiratory failure with hypoxia: Secondary | ICD-10-CM | POA: Diagnosis not present

## 2021-10-13 DIAGNOSIS — N189 Chronic kidney disease, unspecified: Secondary | ICD-10-CM | POA: Diagnosis not present

## 2021-10-13 DIAGNOSIS — L89623 Pressure ulcer of left heel, stage 3: Secondary | ICD-10-CM | POA: Diagnosis not present

## 2021-10-13 DIAGNOSIS — C670 Malignant neoplasm of trigone of bladder: Secondary | ICD-10-CM | POA: Diagnosis not present

## 2021-10-13 DIAGNOSIS — I129 Hypertensive chronic kidney disease with stage 1 through stage 4 chronic kidney disease, or unspecified chronic kidney disease: Secondary | ICD-10-CM | POA: Diagnosis not present

## 2021-10-13 DIAGNOSIS — N1330 Unspecified hydronephrosis: Secondary | ICD-10-CM | POA: Diagnosis not present

## 2021-10-14 DIAGNOSIS — N1330 Unspecified hydronephrosis: Secondary | ICD-10-CM | POA: Diagnosis not present

## 2021-10-14 DIAGNOSIS — I129 Hypertensive chronic kidney disease with stage 1 through stage 4 chronic kidney disease, or unspecified chronic kidney disease: Secondary | ICD-10-CM | POA: Diagnosis not present

## 2021-10-14 DIAGNOSIS — L89623 Pressure ulcer of left heel, stage 3: Secondary | ICD-10-CM | POA: Diagnosis not present

## 2021-10-14 DIAGNOSIS — J9611 Chronic respiratory failure with hypoxia: Secondary | ICD-10-CM | POA: Diagnosis not present

## 2021-10-14 DIAGNOSIS — C670 Malignant neoplasm of trigone of bladder: Secondary | ICD-10-CM | POA: Diagnosis not present

## 2021-10-14 DIAGNOSIS — N189 Chronic kidney disease, unspecified: Secondary | ICD-10-CM | POA: Diagnosis not present

## 2021-10-15 ENCOUNTER — Other Ambulatory Visit: Payer: Self-pay | Admitting: *Deleted

## 2021-10-15 ENCOUNTER — Encounter: Payer: Self-pay | Admitting: *Deleted

## 2021-10-15 DIAGNOSIS — I10 Essential (primary) hypertension: Secondary | ICD-10-CM

## 2021-10-15 NOTE — Patient Instructions (Signed)
Visit Information  Thank you for taking time to visit with me today. Please don't hesitate to contact me if I can be of assistance to you before our next scheduled telephone appointment.  Following are the goals we discussed today:  Take all medications as prescribed Attend all scheduled provider appointments Call pharmacy for medication refills 3-7 days in advance of running out of medications Attend church or other social activities Perform all self care activities independently  Perform IADL's (shopping, preparing meals, housekeeping, managing finances) independently Call provider office for new concerns or questions  check blood pressure weekly choose a place to take my blood pressure (home, clinic or office, retail store) write blood pressure results in a log or diary learn about high blood pressure keep a blood pressure log take blood pressure log to all doctor appointments call doctor for signs and symptoms of high blood pressure keep all doctor appointments take medications for blood pressure exactly as prescribed report new symptoms to your doctor

## 2021-10-15 NOTE — Patient Outreach (Signed)
[facility]) Care Management  [place]  10/15/2021   Chamberlain Steinborn 04-Nov-1940 [id number]  Telephone Assessment-Successful  RN spoke with pt today and introduced the [facility] services and programs. Pt receptive with enrollment today for Hypertension management of care. Completed the initial assessment and will communicate pt's disposition with provider on pt's enrollment.  Will send BP device for weekly monitoring as pt has agree. Will also make a referral to care-guides for an additional transportation source if needed in the further currently pt has a source of hired services for transport to his medical appointments.  Pt has agreed to monthly follow up contacts concerning his management of care. Will follow up over the next few weeks on pt's ongoing care. Addressed all inquires with no additional needs presented at this time.  Encounter Medications:  Outpatient Encounter Medications as of 10/15/2021  Medication Sig Note   acetaminophen (TYLENOL) 325 MG tablet Take 2 tablets (650 mg total) by mouth every 6 (six) hours as needed for mild pain (or Fever >/= 101).    atorvastatin (LIPITOR) 10 MG tablet TAKE 1 TABLET DAILY (Patient taking differently: Take 10 mg by mouth daily.)    docusate sodium (COLACE) 100 MG capsule Take 1 capsule (100 mg total) by mouth 2 (two) times daily.    feeding supplement (ENSURE ENLIVE / ENSURE PLUS) LIQD Take 237 mLs by mouth 3 (three) times daily between meals.    HYDROcodone-acetaminophen (NORCO/VICODIN) 5-325 MG tablet Take 1 tablet by mouth every 6 (six) hours as needed for moderate pain.    loperamide (IMODIUM A-D) 2 MG tablet Take 2-4 mg by mouth 4 (four) times daily as needed for diarrhea or loose stools.    metoprolol tartrate (LOPRESSOR) 25 MG tablet Take 25-37.5 mg by mouth See admin instructions. Take 25 mg by mouth in the morning then 37.5 (1.5 tablets) in the evening    Nystatin (GERHARDT'S BUTT CREAM) CREA Apply 1 application  topically 3 (three) times daily.    pantoprazole (PROTONIX) 40 MG tablet Take 1 tablet (40 mg total) by mouth 2 (two) times daily.    polyethylene glycol (MIRALAX / GLYCOLAX) 17 g packet Take 17 g by mouth daily.    potassium chloride SA (KLOR-CON) 20 MEQ tablet Take 2 tablets (40 mEq total) by mouth daily.    prochlorperazine (COMPAZINE) 10 MG tablet TAKE 1 TABLET(10 MG) BY MOUTH EVERY 6 HOURS AS NEEDED FOR NAUSEA OR VOMITING (Patient taking differently: Take 10 mg by mouth every 6 (six) hours as needed for nausea or vomiting.)    tamsulosin (FLOMAX) 0.4 MG CAPS capsule Take 0.4 mg by mouth daily.    torsemide (DEMADEX) 20 MG tablet Take 1 tablet (20 mg total) by mouth 2 (two) times daily.    XARELTO 20 MG TABS tablet Take 1 tablet (20 mg total) by mouth daily with supper. 09/16/2021: VERIFIED BY DISCHARGE PAPERS FROM [facility]   No facility-administered encounter medications on file as of 10/15/2021.    Functional Status:  In your present state of health, do you have any difficulty performing the following activities: 10/15/2021 08/09/2021  Hearing? Y -  Comment Left ear needs a hearing aide going through the [place]? N -  Difficulty concentrating or making decisions? N -  Walking or climbing stairs? Y -  Comment Uses DME -  Dressing or bathing? N -  Comment - -  Doing errands, shopping? [place] with this task -  Conservation officer, nature  and eating ? N -  Using the Toilet? N -  In the past six months, have you accidently leaked urine? Y -  Comment Uses Depends -  Do you have problems with loss of bowel control? Y -  Comment Depends -  Managing your Medications? N -  Managing your Finances? N -  Housekeeping or managing your Housekeeping? Y -  Comment assistance with this task -  Some recent data might be hidden    Fall/Depression Screening: No flowsheet data found. PHQ 2/9 Scores 10/15/2021  PHQ - 2 Score 0    Assessment:   Care Plan Care Plan : [place] of Care  Updates made by Tobi Bastos, RN since 10/15/2021 12:00 AM     Problem: Knowledge Deficit related to Hypertension   Priority: High     Long-Range Goal: Devvelopment of a Plan of Care for management of Hypertension   Start Date: 10/15/2021  Expected End Date: 03/25/2022  Priority: High  Note:   Current Barriers:  Knowledge Deficits related to plan of care for management of HTN   RNCM Clinical Goal(s):  Patient will verbalize understanding of plan for management of HTN as evidenced by teach-back method of understanding take all medications exactly as prescribed and will call provider for medication related questions as evidenced by self report and chart notification  through collaboration with RN Care manager, provider, and care team.   Interventions: Inter-disciplinary care team collaboration (see longitudinal plan of care) Evaluation of current treatment plan related to  self management and patient's adherence to plan as established by provider   Hypertension Interventions:  (Status:  New goal.) Long Term Goal Last practice recorded BP readings:  BP Readings from Last 3 Encounters:  10/07/21 (!) 90/58  09/21/21 (!) 107/58  08/13/21 106/71  Most recent eGFR/CrCl:  Lab Results  Component Value Date   EGFR 27 (L) 07/22/2021    No components found for: CRCL  Evaluation of current treatment plan related to hypertension self management and patient's adherence to plan as established by provider Provided education to patient re: stroke prevention, s/s of heart attack and stroke Reviewed medications with patient and discussed importance of compliance Provided assistance with obtaining home blood pressure monitor via [facility] services will mail this DME to pt's confirmed address; Discussed plans with patient for ongoing care management follow up and provided patient with direct contact information for care management team Advised patient, providing education and  rationale, to monitor blood pressure daily and record, calling PCP for findings outside established parameters Reviewed scheduled/upcoming provider appointments including:  Discussed complications of poorly controlled blood pressure such as heart disease, stroke, circulatory complications, vision complications, kidney impairment, sexual dysfunction Screening for signs and symptoms of depression related to chronic disease state  Assessed social determinant of health barriers  Patient Goals/Self-Care Activities: Take all medications as prescribed Attend all scheduled provider appointments Call pharmacy for medication refills 3-7 days in advance of running out of medications Attend church or other social activities Perform all self care activities independently  Perform IADL's (shopping, preparing meals, housekeeping, managing finances) independently Call provider office for new concerns or questions  check blood pressure weekly choose a place to take my blood pressure (home, clinic or office, retail store) write blood pressure results in a log or diary learn about high blood pressure keep a blood pressure log take blood pressure log to all doctor appointments call doctor for signs and symptoms of high blood pressure keep all  doctor appointments take medications for blood pressure exactly as prescribed report new symptoms to your doctor  Follow Up Plan:  Telephone follow up appointment with care management team member scheduled for:  Feb 2023 The patient has been provided with contact information for the care management team and has been advised to call with any health related questions or concerns.       Raina Mina, RN Care Management Coordinator [place] Office [phone]

## 2021-10-18 ENCOUNTER — Telehealth: Payer: Self-pay

## 2021-10-18 DIAGNOSIS — J9611 Chronic respiratory failure with hypoxia: Secondary | ICD-10-CM | POA: Diagnosis not present

## 2021-10-18 DIAGNOSIS — N1330 Unspecified hydronephrosis: Secondary | ICD-10-CM | POA: Diagnosis not present

## 2021-10-18 DIAGNOSIS — N189 Chronic kidney disease, unspecified: Secondary | ICD-10-CM | POA: Diagnosis not present

## 2021-10-18 DIAGNOSIS — L89623 Pressure ulcer of left heel, stage 3: Secondary | ICD-10-CM | POA: Diagnosis not present

## 2021-10-18 DIAGNOSIS — C670 Malignant neoplasm of trigone of bladder: Secondary | ICD-10-CM | POA: Diagnosis not present

## 2021-10-18 DIAGNOSIS — I129 Hypertensive chronic kidney disease with stage 1 through stage 4 chronic kidney disease, or unspecified chronic kidney disease: Secondary | ICD-10-CM | POA: Diagnosis not present

## 2021-10-18 NOTE — Telephone Encounter (Signed)
° °  Telephone encounter was:  Unsuccessful.  10/18/2021 Name: Patrick Bishop MRN: [id number] DOB: 10-03-1940  Unsuccessful outbound call made today to assist with:  Transportation Needs   Outreach Attempt:  1st Attempt  A HIPAA compliant voice message was left requesting a return call.  Instructed patient to call back at at earliest convenience.    [place], Care Management  [phone] [address], [place],  [place] [address] Phone: [phone] Email: [email or web address]

## 2021-10-19 ENCOUNTER — Telehealth: Payer: Self-pay

## 2021-10-19 DIAGNOSIS — N1330 Unspecified hydronephrosis: Secondary | ICD-10-CM | POA: Diagnosis not present

## 2021-10-19 DIAGNOSIS — N189 Chronic kidney disease, unspecified: Secondary | ICD-10-CM | POA: Diagnosis not present

## 2021-10-19 DIAGNOSIS — C670 Malignant neoplasm of trigone of bladder: Secondary | ICD-10-CM | POA: Diagnosis not present

## 2021-10-19 DIAGNOSIS — I129 Hypertensive chronic kidney disease with stage 1 through stage 4 chronic kidney disease, or unspecified chronic kidney disease: Secondary | ICD-10-CM | POA: Diagnosis not present

## 2021-10-19 DIAGNOSIS — J9611 Chronic respiratory failure with hypoxia: Secondary | ICD-10-CM | POA: Diagnosis not present

## 2021-10-19 DIAGNOSIS — L89623 Pressure ulcer of left heel, stage 3: Secondary | ICD-10-CM | POA: Diagnosis not present

## 2021-10-19 NOTE — Telephone Encounter (Signed)
° °  Telephone encounter was:  Unsuccessful.  10/19/2021 Name: Travontae Freiberger MRN: [id number] DOB: [birth year]  Unsuccessful outbound call made today to assist with:  Transportation Needs   Outreach Attempt:  2nd Attempt  A HIPAA compliant voice message was left requesting a return call.  Instructed patient to call back at   earliest convenience.   [place], Care Management  [phone] [address], [place],  [address] Phone: [phone] Email: [email or web address]

## 2021-10-20 ENCOUNTER — Telehealth: Payer: Self-pay

## 2021-10-20 DIAGNOSIS — I129 Hypertensive chronic kidney disease with stage 1 through stage 4 chronic kidney disease, or unspecified chronic kidney disease: Secondary | ICD-10-CM | POA: Diagnosis not present

## 2021-10-20 DIAGNOSIS — R7309 Other abnormal glucose: Secondary | ICD-10-CM | POA: Diagnosis not present

## 2021-10-20 DIAGNOSIS — J9611 Chronic respiratory failure with hypoxia: Secondary | ICD-10-CM | POA: Diagnosis not present

## 2021-10-20 DIAGNOSIS — D72819 Decreased white blood cell count, unspecified: Secondary | ICD-10-CM | POA: Diagnosis not present

## 2021-10-20 DIAGNOSIS — N189 Chronic kidney disease, unspecified: Secondary | ICD-10-CM | POA: Diagnosis not present

## 2021-10-20 DIAGNOSIS — D6869 Other thrombophilia: Secondary | ICD-10-CM | POA: Diagnosis not present

## 2021-10-20 DIAGNOSIS — C670 Malignant neoplasm of trigone of bladder: Secondary | ICD-10-CM | POA: Diagnosis not present

## 2021-10-20 DIAGNOSIS — N1330 Unspecified hydronephrosis: Secondary | ICD-10-CM | POA: Diagnosis not present

## 2021-10-20 DIAGNOSIS — L89623 Pressure ulcer of left heel, stage 3: Secondary | ICD-10-CM | POA: Diagnosis not present

## 2021-10-20 DIAGNOSIS — N184 Chronic kidney disease, stage 4 (severe): Secondary | ICD-10-CM | POA: Diagnosis not present

## 2021-10-20 NOTE — Telephone Encounter (Signed)
° °  Telephone encounter was:  Unsuccessful.  10/20/2021 Name: Patrick Bishop MRN: [id number] DOB: November 14, 1940  Unsuccessful outbound call made today to assist with:  Transportation Needs   Outreach Attempt:  3rd Attempt.  Referral closed unable to contact patient.  A HIPAA compliant voice message was left requesting a return call.  Instructed patient to call back at earliest convenience.    [place], Care Management  [phone] [address], [place], [place] [address] Phone: [phone] Email: [email or web address]

## 2021-10-21 DIAGNOSIS — N189 Chronic kidney disease, unspecified: Secondary | ICD-10-CM | POA: Diagnosis not present

## 2021-10-21 DIAGNOSIS — N1330 Unspecified hydronephrosis: Secondary | ICD-10-CM | POA: Diagnosis not present

## 2021-10-21 DIAGNOSIS — J9611 Chronic respiratory failure with hypoxia: Secondary | ICD-10-CM | POA: Diagnosis not present

## 2021-10-21 DIAGNOSIS — C670 Malignant neoplasm of trigone of bladder: Secondary | ICD-10-CM | POA: Diagnosis not present

## 2021-10-21 DIAGNOSIS — L89623 Pressure ulcer of left heel, stage 3: Secondary | ICD-10-CM | POA: Diagnosis not present

## 2021-10-21 DIAGNOSIS — I129 Hypertensive chronic kidney disease with stage 1 through stage 4 chronic kidney disease, or unspecified chronic kidney disease: Secondary | ICD-10-CM | POA: Diagnosis not present

## 2021-10-22 DIAGNOSIS — N1330 Unspecified hydronephrosis: Secondary | ICD-10-CM | POA: Diagnosis not present

## 2021-10-22 DIAGNOSIS — J9611 Chronic respiratory failure with hypoxia: Secondary | ICD-10-CM | POA: Diagnosis not present

## 2021-10-22 DIAGNOSIS — L89623 Pressure ulcer of left heel, stage 3: Secondary | ICD-10-CM | POA: Diagnosis not present

## 2021-10-22 DIAGNOSIS — N189 Chronic kidney disease, unspecified: Secondary | ICD-10-CM | POA: Diagnosis not present

## 2021-10-22 DIAGNOSIS — C670 Malignant neoplasm of trigone of bladder: Secondary | ICD-10-CM | POA: Diagnosis not present

## 2021-10-22 DIAGNOSIS — I129 Hypertensive chronic kidney disease with stage 1 through stage 4 chronic kidney disease, or unspecified chronic kidney disease: Secondary | ICD-10-CM | POA: Diagnosis not present

## 2021-10-25 ENCOUNTER — Other Ambulatory Visit: Payer: Self-pay | Admitting: Urology

## 2021-10-25 ENCOUNTER — Other Ambulatory Visit: Payer: Self-pay | Admitting: Cardiology

## 2021-10-25 DIAGNOSIS — J9611 Chronic respiratory failure with hypoxia: Secondary | ICD-10-CM | POA: Diagnosis not present

## 2021-10-25 DIAGNOSIS — N189 Chronic kidney disease, unspecified: Secondary | ICD-10-CM | POA: Diagnosis not present

## 2021-10-25 DIAGNOSIS — C670 Malignant neoplasm of trigone of bladder: Secondary | ICD-10-CM | POA: Diagnosis not present

## 2021-10-25 DIAGNOSIS — N1330 Unspecified hydronephrosis: Secondary | ICD-10-CM | POA: Diagnosis not present

## 2021-10-25 DIAGNOSIS — I129 Hypertensive chronic kidney disease with stage 1 through stage 4 chronic kidney disease, or unspecified chronic kidney disease: Secondary | ICD-10-CM | POA: Diagnosis not present

## 2021-10-25 DIAGNOSIS — L89623 Pressure ulcer of left heel, stage 3: Secondary | ICD-10-CM | POA: Diagnosis not present

## 2021-10-26 DIAGNOSIS — J9611 Chronic respiratory failure with hypoxia: Secondary | ICD-10-CM | POA: Diagnosis not present

## 2021-10-26 DIAGNOSIS — L89623 Pressure ulcer of left heel, stage 3: Secondary | ICD-10-CM | POA: Diagnosis not present

## 2021-10-26 DIAGNOSIS — N189 Chronic kidney disease, unspecified: Secondary | ICD-10-CM | POA: Diagnosis not present

## 2021-10-26 DIAGNOSIS — N1330 Unspecified hydronephrosis: Secondary | ICD-10-CM | POA: Diagnosis not present

## 2021-10-26 DIAGNOSIS — C670 Malignant neoplasm of trigone of bladder: Secondary | ICD-10-CM | POA: Diagnosis not present

## 2021-10-26 DIAGNOSIS — I129 Hypertensive chronic kidney disease with stage 1 through stage 4 chronic kidney disease, or unspecified chronic kidney disease: Secondary | ICD-10-CM | POA: Diagnosis not present

## 2021-10-27 DIAGNOSIS — N1330 Unspecified hydronephrosis: Secondary | ICD-10-CM | POA: Diagnosis not present

## 2021-10-27 DIAGNOSIS — L89623 Pressure ulcer of left heel, stage 3: Secondary | ICD-10-CM | POA: Diagnosis not present

## 2021-10-27 DIAGNOSIS — N189 Chronic kidney disease, unspecified: Secondary | ICD-10-CM | POA: Diagnosis not present

## 2021-10-27 DIAGNOSIS — C670 Malignant neoplasm of trigone of bladder: Secondary | ICD-10-CM | POA: Diagnosis not present

## 2021-10-27 DIAGNOSIS — J9611 Chronic respiratory failure with hypoxia: Secondary | ICD-10-CM | POA: Diagnosis not present

## 2021-10-27 DIAGNOSIS — I129 Hypertensive chronic kidney disease with stage 1 through stage 4 chronic kidney disease, or unspecified chronic kidney disease: Secondary | ICD-10-CM | POA: Diagnosis not present

## 2021-10-28 DIAGNOSIS — J9611 Chronic respiratory failure with hypoxia: Secondary | ICD-10-CM | POA: Diagnosis not present

## 2021-10-28 DIAGNOSIS — C670 Malignant neoplasm of trigone of bladder: Secondary | ICD-10-CM | POA: Diagnosis not present

## 2021-10-28 DIAGNOSIS — N1330 Unspecified hydronephrosis: Secondary | ICD-10-CM | POA: Diagnosis not present

## 2021-10-28 DIAGNOSIS — N189 Chronic kidney disease, unspecified: Secondary | ICD-10-CM | POA: Diagnosis not present

## 2021-10-28 DIAGNOSIS — L89623 Pressure ulcer of left heel, stage 3: Secondary | ICD-10-CM | POA: Diagnosis not present

## 2021-10-28 DIAGNOSIS — I129 Hypertensive chronic kidney disease with stage 1 through stage 4 chronic kidney disease, or unspecified chronic kidney disease: Secondary | ICD-10-CM | POA: Diagnosis not present

## 2021-10-29 DIAGNOSIS — L89623 Pressure ulcer of left heel, stage 3: Secondary | ICD-10-CM | POA: Diagnosis not present

## 2021-10-29 DIAGNOSIS — I129 Hypertensive chronic kidney disease with stage 1 through stage 4 chronic kidney disease, or unspecified chronic kidney disease: Secondary | ICD-10-CM | POA: Diagnosis not present

## 2021-10-29 DIAGNOSIS — N189 Chronic kidney disease, unspecified: Secondary | ICD-10-CM | POA: Diagnosis not present

## 2021-10-29 DIAGNOSIS — N1330 Unspecified hydronephrosis: Secondary | ICD-10-CM | POA: Diagnosis not present

## 2021-10-29 DIAGNOSIS — C670 Malignant neoplasm of trigone of bladder: Secondary | ICD-10-CM | POA: Diagnosis not present

## 2021-10-29 DIAGNOSIS — J9611 Chronic respiratory failure with hypoxia: Secondary | ICD-10-CM | POA: Diagnosis not present

## 2021-11-01 DIAGNOSIS — N189 Chronic kidney disease, unspecified: Secondary | ICD-10-CM | POA: Diagnosis not present

## 2021-11-01 DIAGNOSIS — L89623 Pressure ulcer of left heel, stage 3: Secondary | ICD-10-CM | POA: Diagnosis not present

## 2021-11-01 DIAGNOSIS — N130 Hydronephrosis with ureteropelvic junction obstruction: Secondary | ICD-10-CM | POA: Diagnosis not present

## 2021-11-01 DIAGNOSIS — N1330 Unspecified hydronephrosis: Secondary | ICD-10-CM | POA: Diagnosis not present

## 2021-11-01 DIAGNOSIS — C670 Malignant neoplasm of trigone of bladder: Secondary | ICD-10-CM | POA: Diagnosis not present

## 2021-11-01 DIAGNOSIS — I129 Hypertensive chronic kidney disease with stage 1 through stage 4 chronic kidney disease, or unspecified chronic kidney disease: Secondary | ICD-10-CM | POA: Diagnosis not present

## 2021-11-01 DIAGNOSIS — J9611 Chronic respiratory failure with hypoxia: Secondary | ICD-10-CM | POA: Diagnosis not present

## 2021-11-01 DIAGNOSIS — R8271 Bacteriuria: Secondary | ICD-10-CM | POA: Diagnosis not present

## 2021-11-01 NOTE — Progress Notes (Signed)
COVID swab appointment:  N/A  COVID Vaccine Completed: Yes x2 Date COVID Vaccine completed: 10-28-19 11-25-19 Has received booster: COVID vaccine manufacturer:  Moderna     Date of COVID positive in last 90 days:  PCP - [name] [name], MD Cardiologist - Glenetta Hew, MD  Chest x-ray - 09-16-21 Epic EKG - 09-21-21 Epic Stress Test -  ECHO - 08-10-21 Epic Cardiac Cath -  Pacemaker/ICD device last checked: Spinal Cord Stimulator:  Bowel Prep -   Sleep Study -  CPAP -   Fasting Blood Sugar -  Checks Blood Sugar _____ times a day  Blood Thinner Instructions:   Xarelto, patient to stay on per Dr. Lovena Neighbours Aspirin Instructions: Last Dose:  Activity level:  Can go up a flight of stairs and perform activities of daily living without stopping and without symptoms of chest pain or shortness of breath.   Able to exercise without symptoms  Unable to go up a flight of stairs without symptoms of      Anesthesia review:  Afib, pulmonary HTN, CHF, HTN, AAA, OSA on CPAP  Patient denies shortness of breath, fever, cough and chest pain at PAT appointment   Patient verbalized understanding of instructions that were given to them at the PAT appointment. Patient was also instructed that they will need to review over the PAT instructions again at home before surgery.

## 2021-11-02 ENCOUNTER — Other Ambulatory Visit: Payer: Self-pay

## 2021-11-02 ENCOUNTER — Encounter (HOSPITAL_COMMUNITY): Payer: Self-pay | Admitting: Urology

## 2021-11-02 DIAGNOSIS — J9611 Chronic respiratory failure with hypoxia: Secondary | ICD-10-CM | POA: Diagnosis not present

## 2021-11-02 DIAGNOSIS — I129 Hypertensive chronic kidney disease with stage 1 through stage 4 chronic kidney disease, or unspecified chronic kidney disease: Secondary | ICD-10-CM | POA: Diagnosis not present

## 2021-11-02 DIAGNOSIS — N1330 Unspecified hydronephrosis: Secondary | ICD-10-CM | POA: Diagnosis not present

## 2021-11-02 DIAGNOSIS — L89623 Pressure ulcer of left heel, stage 3: Secondary | ICD-10-CM | POA: Diagnosis not present

## 2021-11-02 DIAGNOSIS — N189 Chronic kidney disease, unspecified: Secondary | ICD-10-CM | POA: Diagnosis not present

## 2021-11-02 DIAGNOSIS — C670 Malignant neoplasm of trigone of bladder: Secondary | ICD-10-CM | POA: Diagnosis not present

## 2021-11-02 NOTE — Progress Notes (Addendum)
COVID swab appointment:  N/A   COVID Vaccine Completed: Yes x2 Date COVID Vaccine completed: 10-28-19 11-25-19 Has received booster:yes COVID vaccine manufacturer:  Moderna      Date of COVID positive in last 90 days: N/A   PCP - [name] [name], MD Cardiologist - Glenetta Hew, MD   Chest x-ray - 09-16-21 Epic EKG - 09-21-21 Epic Stress Test - N/A ECHO - 08-10-21 Epic Cardiac Cath - greater than 2 years Pacemaker/ICD device last checked: N/A Spinal Cord Stimulator:N/A   Bowel Prep - N/A   Sleep Study - Yes CPAP - not currently using   Fasting Blood Sugar - N/A Checks Blood Sugar __N/A___ times a day   Blood Thinner Instructions:   Xarelto, patient to stay on per Dr. Lovena Neighbours Aspirin Instructions: N/A Last Dose: N/A   Activity level:   Unable to go up a flight of stairs without symptoms of                             Anesthesia review:  Afib, pulmonary HTN, CHF, HTN, AAA, OSA on CPAP   Patient denies shortness of breath, fever, cough and chest pain at PAT appointment     Patient verbalized understanding of instructions that were given to them at the PAT appointment. Patient was also instructed that they will need to review over the PAT instructions again at home before surgery.

## 2021-11-04 ENCOUNTER — Other Ambulatory Visit: Payer: Self-pay | Admitting: Urology

## 2021-11-04 DIAGNOSIS — J9611 Chronic respiratory failure with hypoxia: Secondary | ICD-10-CM | POA: Diagnosis not present

## 2021-11-04 DIAGNOSIS — N189 Chronic kidney disease, unspecified: Secondary | ICD-10-CM | POA: Diagnosis not present

## 2021-11-04 DIAGNOSIS — N1330 Unspecified hydronephrosis: Secondary | ICD-10-CM | POA: Diagnosis not present

## 2021-11-04 DIAGNOSIS — C670 Malignant neoplasm of trigone of bladder: Secondary | ICD-10-CM | POA: Diagnosis not present

## 2021-11-04 DIAGNOSIS — I129 Hypertensive chronic kidney disease with stage 1 through stage 4 chronic kidney disease, or unspecified chronic kidney disease: Secondary | ICD-10-CM | POA: Diagnosis not present

## 2021-11-04 DIAGNOSIS — L89623 Pressure ulcer of left heel, stage 3: Secondary | ICD-10-CM | POA: Diagnosis not present

## 2021-11-05 ENCOUNTER — Ambulatory Visit (HOSPITAL_COMMUNITY)
Admission: RE | Admit: 2021-11-05 | Discharge: 2021-11-05 | Disposition: A | Discharge status: Home / Self Care | Payer: Medicare Other | Source: Ambulatory Visit | Attending: Urology | Admitting: Urology

## 2021-11-05 ENCOUNTER — Ambulatory Visit (HOSPITAL_BASED_OUTPATIENT_CLINIC_OR_DEPARTMENT_OTHER): Payer: Medicare Other | Admitting: Physician Assistant

## 2021-11-05 ENCOUNTER — Encounter (HOSPITAL_COMMUNITY): Payer: Self-pay | Admitting: Urology

## 2021-11-05 ENCOUNTER — Encounter (HOSPITAL_COMMUNITY)
Admission: RE | Disposition: A | Discharge status: Home / Self Care | Payer: Self-pay | Source: Ambulatory Visit | Attending: Urology

## 2021-11-05 ENCOUNTER — Ambulatory Visit (HOSPITAL_COMMUNITY): Payer: Medicare Other

## 2021-11-05 ENCOUNTER — Ambulatory Visit (HOSPITAL_COMMUNITY): Payer: Medicare Other | Admitting: Physician Assistant

## 2021-11-05 DIAGNOSIS — C679 Malignant neoplasm of bladder, unspecified: Secondary | ICD-10-CM

## 2021-11-05 DIAGNOSIS — I110 Hypertensive heart disease with heart failure: Secondary | ICD-10-CM | POA: Diagnosis not present

## 2021-11-05 DIAGNOSIS — Z9221 Personal history of antineoplastic chemotherapy: Secondary | ICD-10-CM | POA: Diagnosis not present

## 2021-11-05 DIAGNOSIS — I739 Peripheral vascular disease, unspecified: Secondary | ICD-10-CM | POA: Diagnosis not present

## 2021-11-05 DIAGNOSIS — Z87891 Personal history of nicotine dependence: Secondary | ICD-10-CM | POA: Diagnosis not present

## 2021-11-05 DIAGNOSIS — I4891 Unspecified atrial fibrillation: Secondary | ICD-10-CM | POA: Insufficient documentation

## 2021-11-05 DIAGNOSIS — Z466 Encounter for fitting and adjustment of urinary device: Secondary | ICD-10-CM | POA: Insufficient documentation

## 2021-11-05 DIAGNOSIS — N136 Pyonephrosis: Secondary | ICD-10-CM | POA: Diagnosis not present

## 2021-11-05 DIAGNOSIS — Z7901 Long term (current) use of anticoagulants: Secondary | ICD-10-CM | POA: Insufficient documentation

## 2021-11-05 DIAGNOSIS — B965 Pseudomonas (aeruginosa) (mallei) (pseudomallei) as the cause of diseases classified elsewhere: Secondary | ICD-10-CM | POA: Insufficient documentation

## 2021-11-05 DIAGNOSIS — E669 Obesity, unspecified: Secondary | ICD-10-CM | POA: Diagnosis not present

## 2021-11-05 DIAGNOSIS — I890 Lymphedema, not elsewhere classified: Secondary | ICD-10-CM | POA: Diagnosis not present

## 2021-11-05 DIAGNOSIS — Z79899 Other long term (current) drug therapy: Secondary | ICD-10-CM | POA: Diagnosis not present

## 2021-11-05 DIAGNOSIS — G4733 Obstructive sleep apnea (adult) (pediatric): Secondary | ICD-10-CM | POA: Diagnosis not present

## 2021-11-05 DIAGNOSIS — I509 Heart failure, unspecified: Secondary | ICD-10-CM | POA: Insufficient documentation

## 2021-11-05 DIAGNOSIS — N390 Urinary tract infection, site not specified: Secondary | ICD-10-CM | POA: Diagnosis not present

## 2021-11-05 DIAGNOSIS — Z1624 Resistance to multiple antibiotics: Secondary | ICD-10-CM | POA: Insufficient documentation

## 2021-11-05 DIAGNOSIS — Z6831 Body mass index (BMI) 31.0-31.9, adult: Secondary | ICD-10-CM | POA: Insufficient documentation

## 2021-11-05 DIAGNOSIS — N135 Crossing vessel and stricture of ureter without hydronephrosis: Secondary | ICD-10-CM

## 2021-11-05 DIAGNOSIS — E785 Hyperlipidemia, unspecified: Secondary | ICD-10-CM | POA: Diagnosis not present

## 2021-11-05 DIAGNOSIS — K219 Gastro-esophageal reflux disease without esophagitis: Secondary | ICD-10-CM | POA: Diagnosis not present

## 2021-11-05 DIAGNOSIS — Z923 Personal history of irradiation: Secondary | ICD-10-CM | POA: Diagnosis not present

## 2021-11-05 DIAGNOSIS — I10 Essential (primary) hypertension: Secondary | ICD-10-CM

## 2021-11-05 DIAGNOSIS — C670 Malignant neoplasm of trigone of bladder: Secondary | ICD-10-CM | POA: Diagnosis not present

## 2021-11-05 HISTORY — DX: Neoplasm of unspecified behavior of unspecified site: D49.9

## 2021-11-05 HISTORY — DX: Full incontinence of feces: R15.9

## 2021-11-05 HISTORY — DX: Dizziness and giddiness: R42

## 2021-11-05 HISTORY — PX: CYSTOSCOPY W/ URETERAL STENT PLACEMENT: SHX1429

## 2021-11-05 LAB — BASIC METABOLIC PANEL
Anion gap: 10 (ref 5–15)
BUN: 20 mg/dL (ref 8–23)
CO2: 25 mmol/L (ref 22–32)
Calcium: 8.6 mg/dL — ABNORMAL LOW (ref 8.9–10.3)
Chloride: 101 mmol/L (ref 98–111)
Creatinine, Ser: 1.86 mg/dL — ABNORMAL HIGH (ref 0.61–1.24)
GFR, Estimated: 36 mL/min — ABNORMAL LOW (ref >60)
Glucose, Bld: 129 mg/dL — ABNORMAL HIGH (ref 70–99)
Potassium: 4.3 mmol/L (ref 3.5–5.1)
Sodium: 136 mmol/L (ref 135–145)

## 2021-11-05 SURGERY — CYSTOSCOPY, FLEXIBLE, WITH STENT REPLACEMENT
Anesthesia: General | Laterality: Left

## 2021-11-05 MED ORDER — GENTAMICIN SULFATE 40 MG/ML IJ SOLN
2.0000 mg/kg | Freq: Once | INTRAVENOUS | Status: AC
  Administered 2021-11-05: 220 mg via INTRAVENOUS
  Filled 2021-11-05: qty 5.5

## 2021-11-05 MED ORDER — FENTANYL CITRATE (PF) 100 MCG/2ML IJ SOLN
INTRAMUSCULAR | Status: AC
  Filled 2021-11-05: qty 2

## 2021-11-05 MED ORDER — LIDOCAINE HCL (PF) 2 % IJ SOLN
INTRAMUSCULAR | Status: AC
  Filled 2021-11-05: qty 5

## 2021-11-05 MED ORDER — ORAL CARE MOUTH RINSE: 15.0000 mL | Freq: Once | OROMUCOSAL | Status: AC

## 2021-11-05 MED ORDER — PROPOFOL 10 MG/ML IV BOLUS
INTRAVENOUS | Status: DC | PRN
  Administered 2021-11-05: 140 mg via INTRAVENOUS

## 2021-11-05 MED ORDER — PHENYLEPHRINE 40 MCG/ML (10ML) SYRINGE FOR IV PUSH (FOR BLOOD PRESSURE SUPPORT)
PREFILLED_SYRINGE | INTRAVENOUS | Status: AC
  Filled 2021-11-05: qty 10

## 2021-11-05 MED ORDER — ONDANSETRON HCL 4 MG/2ML IJ SOLN
INTRAMUSCULAR | Status: DC | PRN
  Administered 2021-11-05: 4 mg via INTRAVENOUS

## 2021-11-05 MED ORDER — ONDANSETRON HCL 4 MG/2ML IJ SOLN
INTRAMUSCULAR | Status: AC
  Filled 2021-11-05: qty 2

## 2021-11-05 MED ORDER — LACTATED RINGERS IV SOLN: INTRAVENOUS | Status: DC

## 2021-11-05 MED ORDER — LIDOCAINE HCL (CARDIAC) PF 100 MG/5ML IV SOSY
PREFILLED_SYRINGE | INTRAVENOUS | Status: DC | PRN
Start: 2021-11-05 — End: 2021-11-05
  Administered 2021-11-05: 100 mg via INTRAVENOUS

## 2021-11-05 MED ORDER — PHENYLEPHRINE 40 MCG/ML (10ML) SYRINGE FOR IV PUSH (FOR BLOOD PRESSURE SUPPORT)
PREFILLED_SYRINGE | INTRAVENOUS | Status: DC | PRN
  Administered 2021-11-05: 80 ug via INTRAVENOUS
  Administered 2021-11-05 (×2): 120 ug via INTRAVENOUS
  Administered 2021-11-05: 80 ug via INTRAVENOUS

## 2021-11-05 MED ORDER — FENTANYL CITRATE (PF) 100 MCG/2ML IJ SOLN
INTRAMUSCULAR | Status: DC | PRN
  Administered 2021-11-05: 50 ug via INTRAVENOUS

## 2021-11-05 MED ORDER — CHLORHEXIDINE GLUCONATE 0.12 % MT SOLN
15.0000 mL | Freq: Once | OROMUCOSAL | Status: AC
  Administered 2021-11-05: 15 mL via OROMUCOSAL

## 2021-11-05 MED ORDER — PROPOFOL 10 MG/ML IV BOLUS
INTRAVENOUS | Status: AC
  Filled 2021-11-05: qty 20

## 2021-11-05 MED ORDER — IOHEXOL 300 MG/ML  SOLN
INTRAMUSCULAR | Status: DC | PRN
  Administered 2021-11-05: 10 mL

## 2021-11-05 MED ORDER — FENTANYL CITRATE PF 50 MCG/ML IJ SOSY: 25.0000 ug | PREFILLED_SYRINGE | INTRAMUSCULAR | Status: DC | PRN

## 2021-11-05 MED ORDER — ACETAMINOPHEN 500 MG PO TABS
1000.0000 mg | ORAL_TABLET | Freq: Once | ORAL | Status: AC
  Administered 2021-11-05: 1000 mg via ORAL
  Filled 2021-11-05: qty 2

## 2021-11-05 MED ORDER — DEXAMETHASONE SODIUM PHOSPHATE 10 MG/ML IJ SOLN
INTRAMUSCULAR | Status: DC | PRN
  Administered 2021-11-05: 8 mg via INTRAVENOUS

## 2021-11-05 MED ORDER — DEXAMETHASONE SODIUM PHOSPHATE 10 MG/ML IJ SOLN
INTRAMUSCULAR | Status: AC
  Filled 2021-11-05: qty 1

## 2021-11-05 SURGICAL SUPPLY — 12 items
BAG URINE DRAIN 2000ML AR STRL (UROLOGICAL SUPPLIES) ×1 IMPLANT
BAG URO CATCHER STRL LF (MISCELLANEOUS) ×2 IMPLANT
CATH FOLEY 2WAY SLVR  5CC 16FR (CATHETERS) ×1
CATH FOLEY 2WAY SLVR 5CC 16FR (CATHETERS) IMPLANT
CATH URETL OPEN 5X70 (CATHETERS) ×2 IMPLANT
CLOTH BEACON ORANGE TIMEOUT ST (SAFETY) ×2 IMPLANT
GLOVE SURG ENC TEXT LTX SZ7.5 (GLOVE) ×2 IMPLANT
GOWN STRL REUS W/TWL XL LVL3 (GOWN DISPOSABLE) ×2 IMPLANT
GUIDEWIRE ZIPWRE .038 STRAIGHT (WIRE) ×2 IMPLANT
MANIFOLD NEPTUNE II (INSTRUMENTS) ×2 IMPLANT
PACK CYSTO (CUSTOM PROCEDURE TRAY) ×2 IMPLANT
TUBING CONNECTING 10 (TUBING) ×2 IMPLANT

## 2021-11-05 NOTE — H&P (Signed)
PRE-OP H&P  Office Visit Report     11/01/2021   --------------------------------------------------------------------------------   Patrick Bishop  MRN: [id number]  DOB: 06/27/1941, 82 year old Male  SSN:    PRIMARY CARE:  Patrick [name], MD  REFERRING:  Patrick Norfolk. Lovena Neighbours, MD  PROVIDER:  Ellison Bishop, M.D.  TREATING:  Patrick Crocker, NP  LOCATION:  [facility] [phone]     --------------------------------------------------------------------------------   CC/HPI: Left ureteralBladder cancer   Patrick Bishop is a 81 year old male with a history of kidney stones, CHF, A-fib (on lovenox), PVD, LE lymphedema, HTN, HLD, obesity and OSA. 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 s/p chemo/radiation in 2022 as primary treatment.   10/29/2020: The patient is here today for routine follow-up following TURBT and left ureteral stent placement on 10/21/2020. Pathologic analysis of the bladder mass sample revealed high-grade muscle invasive urothelial carcinoma. The patient has done well after surgery and denies any significant issue with gross hematuria, left flank pain or pain with his Foley catheter. He is accompanied by his daughter Patrick Bishop at today's visit.   11/04/2020: Unsuccessful trial of void at last office visit. His catheter was replaced and patient started on tamsulosin. His urologist felt it was imperative to leave indwelling left ureteral stent until further follow-up in evaluation could occur. He has pending evaluation with Patrick Bishop, Patrick Bishop and staging CT imaging in the near future. He is here today for consideration of repeat trial of void.   Patient tolerating tamsulosin well without any noted side effect. Catheter has been draining appropriately. He has had minimal hematuria, no exacerbation of left-sided pain/discomfort. He denies  painful urgency or significant leaking around Catheter tubing. No interval fevers or chills, nausea/vomiting. Patient has follow-up with Patrick Bishop tomorrow and is scheduled for CT imaging next week. He is scheduled to see Patrick Bishop in March.   12/28/2020: The patient is here today for a routine follow-up with his daughter Patrick Bishop He recently started chemoradiation to treat his muscle invasive bladder cancer. He is tolerating treatments well thus far. He reports stable urinary urgency since his last visit and states that he has an adequate force of stream and feels like he is emptying his bladder well. He denies interval UTIs or flank pain, but does have episodes of dysuria and gross hematuria. No recent staging imaging has been performed.   04/22/21: The patient is here today for a routine follow-up. Recent surveillance CT scan showed no evidence of metastasis. He continues to have urinary urgency and frequency every 1-2 hours, which is baseline for him. He denies interval UTIs, dysuria or gross hematuria.   11/01/2021: Here for preoperative appointment prior to undergoing stent exchange on 2/10 with his urologist. This was last completed in August of last year. Patient has been hospitalized twice with urology consulting both times (last admission in late December 2022) for UTI, AKI (dehydration). CT imaging obtained showed an appropriately positioned left ureteral stent. Urine culture from December was positive for mixed growth only. He was treated appropriately with IV antibiotics and then discharged on a 10-day course of Bactrim. Most recent creatinine from about 10 days ago was 1.9. No longer in SNF, he has been home now for approximately 2 weeks. His mobility has worsened. He requires assistance with standing and position changes. He has continued daytime frequency/urgency with mixed incontinence. This is managed  with depends. He also continues tamsulosin. Patient also complaining of persistent diarrhea. This  has been assessed by primary care and patient has been prescribed Imodium. He is not having dysuria, gross hematuria, fevers or chills. He is on a diuretic but still has persistent lower extremity swelling he denies chest pains or shortness of breath..   The patient was found to have a MDR pseudomonal UTI on urine cx from 11/01/21, but has been largely asymptomatc.  He will be receiving IV Gentamicin pre-op today.    ALLERGIES: Aldactone TABS    MEDICATIONS: Metoprolol Tartrate 25 mg tablet  Tamsulosin Hcl 0.4 mg capsule 1 capsule PO Daily  Atorvastatin Calcium 10 mg tablet  Hydrocodone-Acetaminophen 5 mg-325 mg tablet  Imodium A-D  Prochlorperazine Maleate 10 mg tablet  Xarelto 20 mg tablet     GU PSH: Cysto Remove Stent FB Sim - 11/13/2019 Cystoscopy Insert Stent - 05/12/2021, Left - 01/19/2021, Left - 10/21/2020 Cystoscopy TURBT 2-5 cm - 10/21/2020 Locm 300-399Mg /Ml Iodine,1Ml - 01/11/2021 Ureteroscopic laser litho, Left - 11/06/2019       PSH Notes: Surgical History  06/24/2019 Ir nephrostomy placement left  04/26/2017 Transthoracic echocardiogram  11/2014 ABDOMINAL VENOUS DUPLEX [Other] (Bilateral)  10/2014 Ir transcath plc stent ea add vein inc angioplasty (Left)  08/2014 Ir transcath plc stent initial vein inc angioplasty (Right)  07/2014 ABDOMINAL VENOUS DUPLEX [Other] (Bilateral)  02/2010 Transthoracic echocardiogram  10/2009 Cardiac catheterization  08/2009 CARDIAC PET [Other]  11/2005 Nm myoview ltd  Date Unknown Colonoscopy  Date Unknown Esophagogastroduodenoscopy  Before 2015 GREATER SAPHENOUS VEIN ABLATION [Other] (Bilateral)      NON-GU PSH: Cardiac Stent Placement     GU PMH: Bladder Cancer Trigone - 04/22/2021, - 01/11/2021, - 12/28/2020, - 11/04/2020, Muscle invasive bladder cancer involving the left fore trigone with direct involvement of the left ureteral orifice, status post partial tumor resection on 10/21/2020., - 10/29/2020 Hydronephrosis - 04/22/2021, - 12/28/2020, -  11/04/2020 (Stable), - 10/29/2020, - 08/18/2020 Incomplete bladder emptying - 10/29/2020 Acute kidney failure, Acute on Chronic. Creatinine has been trending upwards over the past year. It was 1.2 in January prior to stone procedure, now 1.99. 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 -    Atrial fibrillation (HCC)    Congestive heart failure (HCC)  Respiratory  OSA on CPAP  Digestive    Cholelithiasis  Nervous and Auditory    Hearing loss  Musculoskeletal and Integument    Arthritis  Genitourinary    Septic shock due to urinary tract infection (HCC)    Hydronephrosis with obstructing calculus  Other  Hyperlipidemia with target LDL less than 100  Morbid obesity (HCC)    Chronic pain    Lymphedema    Muscle weakness    Unsteady gait    Elevated troponin    Heme + stool    Decreased hemoglobin      NON-GU PMH: Pyuria/other UA findings - 12/28/2020, - 2021 Bacteriuria - 2021    FAMILY HISTORY: Cancer - Mother Hypertension - Father, Brother   SOCIAL HISTORY: Marital Status: Widowed Preferred Language: English; Race: White Current Smoking Status: Patient does not smoke anymore.   Tobacco Use Assessment Completed: Used Tobacco in  last 30 days? Has never drank.  Does not drink caffeine.    REVIEW OF SYSTEMS:    GU Review Male:   Patient reports frequent urination, hard to postpone urination, get up at night to urinate, and leakage of urine. Patient denies burning/ pain with urination, stream starts and stops, trouble starting your stream, have to strain to urinate , erection problems, and penile pain.  Gastrointestinal  (Upper):   Patient denies nausea, vomiting, and indigestion/ heartburn.  Gastrointestinal (Lower):   Patient reports diarrhea. Patient denies constipation.  Constitutional:   Patient reports fatigue. Patient denies fever, night sweats, and weight loss.  Skin:   Patient denies skin rash/ lesion and itching.  Eyes:   Patient denies blurred vision and double vision.  Ears/ Nose/ Throat:   Patient denies sore throat and sinus problems.  Hematologic/Lymphatic:   Patient denies swollen glands and easy bruising.  Cardiovascular:   Patient reports leg swelling. Patient denies chest pains.  Respiratory:   Patient reports cough and shortness of breath.   Endocrine:   Patient denies excessive thirst.  Musculoskeletal:   Patient reports joint pain. Patient denies back pain.  Neurological:   Patient reports dizziness. Patient denies headaches.  Psychologic:   Patient denies depression and anxiety.   VITAL SIGNS:      11/01/2021 01:17 PM  BP 100/58 mmHg  Pulse 80 /min  Temperature 97.1 F / 36.1 C  Notes: BP assessed with manual cuff   MULTI-SYSTEM PHYSICAL EXAMINATION:    Constitutional: Well-nourished. No physical deformities. Normally developed. Good grooming. Pt in wheelchair. He requires assistance with standing and position changes.  Neck: Neck symmetrical, not swollen. Normal tracheal position.  Respiratory: Normal breath sounds. No labored breathing, no use of accessory muscles.   Cardiovascular: Warm extremities, extremity swelling. Normal extremity pulses, no varicosities.   Neurologic / Psychiatric: Oriented to time, oriented to place, oriented to person. No depression, no anxiety, no agitation.   Gastrointestinal: Obese abdomen. No mass, no tenderness, no rigidity.   Musculoskeletal: Normal gait and station of head and neck.     Complexity of Data:  Source Of History:  Patient, Medical Record Summary  Lab Test Review:   CBC with Diff, CMP  Records Review:   Pathology Reports, Previous  Doctor Records, Previous Hospital Records, Previous Patient Records  Urine Test Review:   Urinalysis, Urine Culture  X-Ray Review: C.T. Abdomen/Pelvis: Reviewed Films. Reviewed Report.     11/01/21 11/01/21  Urinalysis  Urine Appearance Cloudy  Cloudy   Urine Color Amber  Amber   Urine Glucose Neg mg/dL Neg   Urine Bilirubin Neg mg/dL Neg   Urine Ketones Neg mg/dL Neg   Urine Specific Gravity 1.020  1.020   Urine Blood 3+ ery/uL 3+   Urine pH 6.0  6.0   Urine Protein 3+ mg/dL 3+   Urine Urobilinogen 0.2 mg/dL 0.2   Urine Nitrites Neg  Neg   Urine Leukocyte Esterase 3+ leu/uL 3+   Urine WBC/hpf >60/hpf  >60/hpf   Urine RBC/hpf >60/hpf  >60/hpf   Urine Epithelial Cells NS (Not Seen)  NS (Not Seen)   Urine Bacteria Many (>50/hpf)  Many (>50/hpf)   Urine Mucous Not Present  Not Present   Urine Yeast NS (Not Seen)  NS (Not Seen)   Urine Trichomonas Not Present  Not Present   Urine Cystals NS (Not Seen)  NS (Not Seen)   Urine Casts NS (Not Seen)  NS (Not Seen)   Urine Sperm Not Present  Not Present   Notes:                     CLINICAL DATA: Acute abdominal pain, possible sepsis, history of  left ureteral stent     EXAM:  CT ABDOMEN AND PELVIS WITHOUT CONTRAST     TECHNIQUE:  Multidetector CT imaging of the abdomen and pelvis was performed  following the standard protocol without IV contrast.     COMPARISON: 08/11/2021     FINDINGS:  Lower chest: No acute abnormality.     Hepatobiliary: Liver is within normal limits. Gallbladder is well  distended. A few small dependent gallstones are seen 1 of which lies  in the gallbladder neck. No inflammatory changes are seen.     Pancreas: Unremarkable. No pancreatic ductal dilatation or  surrounding inflammatory changes.     Spleen: Normal in size without focal abnormality.     Adrenals/Urinary Tract: Adrenal glands are within normal limits.  Right kidney shows no focal abnormality. No renal calculi are seen.  Left kidney  demonstrates mild cortical thinning. Left hydronephrosis  is noted with a ureteral stent in place. No definitive renal or  ureteral stones are noted. The bladder is partially distended.     Stomach/Bowel: The appendix is within normal limits. No obstructive  or inflammatory changes of the colon are seen. Small bowel and  stomach are within normal limits.     Vascular/Lymphatic: Atherosclerotic calcifications are noted.  Aneurysmal dilatation of the infrarenal aorta to 3.4 cm is noted.  Bilateral iliac venous stents are noted. No significant  lymphadenopathy is noted.     Reproductive: Prostate is unremarkable.     Other: No abdominal wall hernia or abnormality. No abdominopelvic  ascites.     Musculoskeletal: No acute or significant osseous findings.     IMPRESSION:  Left ureteral stent in place with persistent mild hydronephrosis. No  calculi are seen.     Cholelithiasis without inflammatory change although a stone is noted  within the gallbladder neck.     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 Recommend follow-up  every 3 years.     Reference: J Am Coll Radiol 2426;83:419-622.        Electronically Signed  By: Inez Catalina M.D.  On: 09/16/2021 23:08   PROCEDURES:         In and Out Cath for Specimen/ Medicare - [id number]  A 14 French clear in and out catheter was inserted into the bladder using sterile technique. A urine culture was sent to the lab.         Urinalysis w/Scope - 81001 Dipstick Dipstick Cont'd Micro  Color: Amber Bilirubin: Neg WBC/hpf: >60/hpf  Appearance: Cloudy Ketones: Neg RBC/hpf: >60/hpf  Specific Gravity: 1.020 Blood: 3+ Bacteria: Many (>50/hpf)  pH: 6.0 Protein: 3+ Cystals: NS (Not Seen)  Glucose: Neg Urobilinogen: 0.2 Casts: NS (Not Seen)    Nitrites: Neg Trichomonas: Not Present    Leukocyte Esterase: 3+ Mucous: Not Present      Epithelial Cells: NS (Not Seen)      Yeast: NS (Not Seen)      Sperm: Not Present    Notes:  microscopic  not concentrated     ASSESSMENT:      ICD-10 Details  1 GU:   Hydronephrosis - N13.0 Left, Chronic, Stable  2   Bladder Cancer Trigone - C67.0 Chronic, Stable   PLAN: .-The risks, benefits and alternatives of cystoscopy with LEFT JJ stent placement was discussed with the patient.  Risks include, but are not limited to: bleeding, urinary tract infection, ureteral injury, ureteral stricture disease, chronic pain, urinary symptoms, bladder injury, stent migration, the need for nephrostomy tube placement, MI, CVA, DVT, PE and the inherent risks with general anesthesia.  The patient voices understanding and wishes to proceed.

## 2021-11-05 NOTE — Op Note (Signed)
Operative Note  Preoperative diagnosis:  1.  Muscle invasive bladder cancer 2.  Left ureteral obstruction 3.  Multidrug-resistant pseudomonal UTI  Postoperative diagnosis: 1.  Muscle invasive bladder cancer 2.  Left ureteral obstruction 3.  Multidrug-resistant pseudomonal UTI  Procedure(s): 1.  Cystoscopy with left ureteral stent exchange 2.  Left retrograde pyelogram with intraoperative interpretation fluoroscopic imaging  Surgeon: Ellison Hughs, MD  Assistants:  None  Anesthesia:  General  Complications:  None  EBL: Less than 5 mL  Specimens: 1.  Previously placed left ureteral stent was removed intact, inspected and discarded  Drains/Catheters: 1.  Left 6 French, 26 cm JJ stent without tether 2.  16 French Foley catheter with 10 mL of sterile water in the balloon  Intraoperative findings:   Left retrograde pyelogram revealed significant narrowing of the distal aspects of the left ureter with proximal and uniform dilation of the left ureter, renal pelvis and renal calyces.  No other filling defects were identified.  Indication:  Patrick Bishop is a 81 y.o. male with a history of muscle invasive bladder cancer, status post pelvic XRT along with left ureteral obstruction requiring an indwelling ureteral stent.  Due to multiple medical issues, the patient has gone several months beyond the recommended time for stent exchange.  He was recently found to have a MDR Pseudomonal UTI, but has remained asymptomatic.  He received IV gentamicin pre-operatively for prophylaxis.  He has been consented for the above procedures, voices understanding and wishes to proceed.  Description of procedure:  After informed consent was obtained, the patient was brought to the operating room and general anesthesia was administered. The patient was then placed in the dorsolithotomy position and prepped and draped in the usual sterile fashion. A timeout was performed. A 23 French rigid cystoscope  was then inserted into the urethral meatus and advanced into the bladder under direct vision. A complete bladder survey revealed no intravesical pathology or signs of UCC recurrence.  His was urine was slightly cloudy.    His previously placed left ureteral stent was then grasped at the distal curl and removed intact.  A Glidewire was then advanced up the left ureter and up to the left renal pelvis, fluoroscopic guidance.  A 5 French ureteral catheter was then advanced over the wire and into the proximal aspects of the left collecting system.  A retrograde pyelogram was obtained with the findings listed above.  The Glidewire was then reinserted through the ureteral catheter and into the left renal pelvis, or fluoroscopic guidance.  A new 6 [place], 26 cm JJ stent was then advanced over the wire and into good position within the left collecting system, confirming placement via fluoroscopy.  The cystoscope was then removed.  A 16 French Foley catheter was then inserted into the bladder with return of clear irrigant.  10 mL of sterile water was then used to inflate the catheter balloon.  His Foley catheter was then placed to gravity drainage.  He tolerated the procedure well and was transferred to the postanesthesia in stable condition.  Plan: Remove Foley catheter at 7 AM on 11/08/2021.  Follow-up on 11/18/2021

## 2021-11-05 NOTE — Anesthesia Procedure Notes (Signed)
Procedure Name: LMA Insertion Date/Time: 11/05/2021 1:16 PM Performed by: Lind Covert, CRNA Pre-anesthesia Checklist: Emergency Drugs available, Patient identified, Suction available, Patient being monitored and Timeout performed Patient Re-evaluated:Patient Re-evaluated prior to induction Oxygen Delivery Method: Circle system utilized Preoxygenation: Pre-oxygenation with 100% oxygen Induction Type: IV induction LMA: LMA inserted LMA Size: 5.0 Tube type: Oral Number of attempts: 1 Placement Confirmation: positive ETCO2 and breath sounds checked- equal and bilateral Tube secured with: Tape Dental Injury: Teeth and Oropharynx as per pre-operative assessment

## 2021-11-05 NOTE — Anesthesia Preprocedure Evaluation (Addendum)
Anesthesia Evaluation  Patient identified by MRN, date of birth, ID band Patient awake    Reviewed: Allergy & Precautions, NPO status , Patient's Chart, lab work & pertinent test results, reviewed documented beta blocker date and time   History of Anesthesia Complications Negative for: history of anesthetic complications  Airway Mallampati: II  TM Distance: >3 FB Neck ROM: Full    Dental  (+) Edentulous Upper, Edentulous Lower   Pulmonary sleep apnea (noncompliant with CPAP) , former smoker,    Pulmonary exam normal        Cardiovascular hypertension, Pt. on home beta blockers and Pt. on medications + Peripheral Vascular Disease  Normal cardiovascular exam+ dysrhythmias Atrial Fibrillation   TTE 07/2021: EF 60-65%, mild LVH, RV pressure and volume overload, RV systolic function low normal, severe RVE, moderate LAE, severe RAE, moderate TR     Neuro/Psych negative neurological ROS  negative psych ROS   GI/Hepatic Neg liver ROS, GERD  Medicated and Controlled,  Endo/Other  negative endocrine ROS  Renal/GU Renal InsufficiencyRenal disease (LEFT URETERAL OBSTRUCTION)  negative genitourinary   Musculoskeletal  (+) Arthritis ,   Abdominal   Peds  Hematology negative hematology ROS (+) Blood dyscrasia, anemia ,   Anesthesia Other Findings Day of surgery medications reviewed with patient.  Reproductive/Obstetrics negative OB ROS                           Anesthesia Physical Anesthesia Plan  ASA: 3  Anesthesia Plan: General   Post-op Pain Management: Tylenol PO (pre-op)   Induction: Intravenous  PONV Risk Score and Plan: 2 and Treatment may vary due to age or medical condition, Ondansetron and Dexamethasone  Airway Management Planned: LMA  Additional Equipment: None  Intra-op Plan:   Post-operative Plan: Extubation in OR  Informed Consent: I have reviewed the patients History and  Physical, chart, labs and discussed the procedure including the risks, benefits and alternatives for the proposed anesthesia with the patient or authorized representative who has indicated his/her understanding and acceptance.     Dental advisory given  Plan Discussed with: CRNA  Anesthesia Plan Comments:        Anesthesia Quick Evaluation

## 2021-11-05 NOTE — Transfer of Care (Signed)
Immediate Anesthesia Transfer of Care Note  Patient: Patrick Bishop  Procedure(s) Performed: CYSTOSCOPY WITH STENT EXCHANGE/ RETROGRADE (Left)  Patient Location: PACU  Anesthesia Type:General  Level of Consciousness: sedated  Airway & Oxygen Therapy: Patient Spontanous Breathing and Patient connected to face mask oxygen  Post-op Assessment: Report given to RN and Post -op Vital signs reviewed and stable  Post vital signs: Reviewed and stable  Last Vitals:  Vitals Value Taken Time  BP    Temp    Pulse 80 11/05/21 1345  Resp 22 11/05/21 1345  SpO2 100 % 11/05/21 1345  Vitals shown include unvalidated device data.  Last Pain:  Vitals:   11/05/21 1200  TempSrc:   PainSc: 4       Patients Stated Pain Goal: 3 (52/08/02 2336)  Complications: No notable events documented.

## 2021-11-06 NOTE — Anesthesia Postprocedure Evaluation (Signed)
Anesthesia Post Note  Patient: Patrick Bishop  Procedure(s) Performed: CYSTOSCOPY WITH STENT EXCHANGE/ RETROGRADE (Left)     Patient location during evaluation: PACU Anesthesia Type: General Level of consciousness: awake and alert Pain management: pain level controlled Vital Signs Assessment: post-procedure vital signs reviewed and stable Respiratory status: spontaneous breathing, nonlabored ventilation and respiratory function stable Cardiovascular status: blood pressure returned to baseline Postop Assessment: no apparent nausea or vomiting Anesthetic complications: no   No notable events documented.  Last Vitals:  Vitals:   11/05/21 1400 11/05/21 1415  BP: 107/68 97/63  Pulse: 95 84  Resp: 19 (!) 21  Temp:  36.4 C  SpO2: 98% 97%    Last Pain:  Vitals:   11/05/21 1415  TempSrc:   PainSc: 0-No pain                 Marthenia Rolling

## 2021-11-07 ENCOUNTER — Encounter (HOSPITAL_COMMUNITY): Payer: Self-pay | Admitting: Urology

## 2021-11-08 DIAGNOSIS — N189 Chronic kidney disease, unspecified: Secondary | ICD-10-CM | POA: Diagnosis not present

## 2021-11-08 DIAGNOSIS — C670 Malignant neoplasm of trigone of bladder: Secondary | ICD-10-CM | POA: Diagnosis not present

## 2021-11-08 DIAGNOSIS — I129 Hypertensive chronic kidney disease with stage 1 through stage 4 chronic kidney disease, or unspecified chronic kidney disease: Secondary | ICD-10-CM | POA: Diagnosis not present

## 2021-11-08 DIAGNOSIS — J9611 Chronic respiratory failure with hypoxia: Secondary | ICD-10-CM | POA: Diagnosis not present

## 2021-11-08 DIAGNOSIS — L89623 Pressure ulcer of left heel, stage 3: Secondary | ICD-10-CM | POA: Diagnosis not present

## 2021-11-08 DIAGNOSIS — N1330 Unspecified hydronephrosis: Secondary | ICD-10-CM | POA: Diagnosis not present

## 2021-11-10 DIAGNOSIS — N1330 Unspecified hydronephrosis: Secondary | ICD-10-CM | POA: Diagnosis not present

## 2021-11-10 DIAGNOSIS — I129 Hypertensive chronic kidney disease with stage 1 through stage 4 chronic kidney disease, or unspecified chronic kidney disease: Secondary | ICD-10-CM | POA: Diagnosis not present

## 2021-11-10 DIAGNOSIS — I4891 Unspecified atrial fibrillation: Secondary | ICD-10-CM | POA: Diagnosis not present

## 2021-11-10 DIAGNOSIS — Z4800 Encounter for change or removal of nonsurgical wound dressing: Secondary | ICD-10-CM | POA: Diagnosis not present

## 2021-11-10 DIAGNOSIS — G4730 Sleep apnea, unspecified: Secondary | ICD-10-CM | POA: Diagnosis not present

## 2021-11-10 DIAGNOSIS — N179 Acute kidney failure, unspecified: Secondary | ICD-10-CM | POA: Diagnosis not present

## 2021-11-10 DIAGNOSIS — Z87440 Personal history of urinary (tract) infections: Secondary | ICD-10-CM | POA: Diagnosis not present

## 2021-11-10 DIAGNOSIS — Z960 Presence of urogenital implants: Secondary | ICD-10-CM | POA: Diagnosis not present

## 2021-11-10 DIAGNOSIS — Z7901 Long term (current) use of anticoagulants: Secondary | ICD-10-CM | POA: Diagnosis not present

## 2021-11-10 DIAGNOSIS — Z993 Dependence on wheelchair: Secondary | ICD-10-CM | POA: Diagnosis not present

## 2021-11-10 DIAGNOSIS — C670 Malignant neoplasm of trigone of bladder: Secondary | ICD-10-CM | POA: Diagnosis not present

## 2021-11-10 DIAGNOSIS — I272 Pulmonary hypertension, unspecified: Secondary | ICD-10-CM | POA: Diagnosis not present

## 2021-11-10 DIAGNOSIS — N189 Chronic kidney disease, unspecified: Secondary | ICD-10-CM | POA: Diagnosis not present

## 2021-11-10 DIAGNOSIS — E669 Obesity, unspecified: Secondary | ICD-10-CM | POA: Diagnosis not present

## 2021-11-10 DIAGNOSIS — L89623 Pressure ulcer of left heel, stage 3: Secondary | ICD-10-CM | POA: Diagnosis not present

## 2021-11-10 DIAGNOSIS — L89152 Pressure ulcer of sacral region, stage 2: Secondary | ICD-10-CM | POA: Diagnosis not present

## 2021-11-10 DIAGNOSIS — Z9181 History of falling: Secondary | ICD-10-CM | POA: Diagnosis not present

## 2021-11-10 DIAGNOSIS — Z79891 Long term (current) use of opiate analgesic: Secondary | ICD-10-CM | POA: Diagnosis not present

## 2021-11-10 DIAGNOSIS — G8929 Other chronic pain: Secondary | ICD-10-CM | POA: Diagnosis not present

## 2021-11-10 DIAGNOSIS — J9611 Chronic respiratory failure with hypoxia: Secondary | ICD-10-CM | POA: Diagnosis not present

## 2021-11-11 DIAGNOSIS — C670 Malignant neoplasm of trigone of bladder: Secondary | ICD-10-CM | POA: Diagnosis not present

## 2021-11-11 DIAGNOSIS — I129 Hypertensive chronic kidney disease with stage 1 through stage 4 chronic kidney disease, or unspecified chronic kidney disease: Secondary | ICD-10-CM | POA: Diagnosis not present

## 2021-11-11 DIAGNOSIS — L89623 Pressure ulcer of left heel, stage 3: Secondary | ICD-10-CM | POA: Diagnosis not present

## 2021-11-11 DIAGNOSIS — J9611 Chronic respiratory failure with hypoxia: Secondary | ICD-10-CM | POA: Diagnosis not present

## 2021-11-11 DIAGNOSIS — N1330 Unspecified hydronephrosis: Secondary | ICD-10-CM | POA: Diagnosis not present

## 2021-11-11 DIAGNOSIS — N189 Chronic kidney disease, unspecified: Secondary | ICD-10-CM | POA: Diagnosis not present

## 2021-11-12 DIAGNOSIS — I129 Hypertensive chronic kidney disease with stage 1 through stage 4 chronic kidney disease, or unspecified chronic kidney disease: Secondary | ICD-10-CM | POA: Diagnosis not present

## 2021-11-12 DIAGNOSIS — N1330 Unspecified hydronephrosis: Secondary | ICD-10-CM | POA: Diagnosis not present

## 2021-11-12 DIAGNOSIS — N189 Chronic kidney disease, unspecified: Secondary | ICD-10-CM | POA: Diagnosis not present

## 2021-11-12 DIAGNOSIS — C670 Malignant neoplasm of trigone of bladder: Secondary | ICD-10-CM | POA: Diagnosis not present

## 2021-11-12 DIAGNOSIS — L89623 Pressure ulcer of left heel, stage 3: Secondary | ICD-10-CM | POA: Diagnosis not present

## 2021-11-12 DIAGNOSIS — J9611 Chronic respiratory failure with hypoxia: Secondary | ICD-10-CM | POA: Diagnosis not present

## 2021-11-15 ENCOUNTER — Other Ambulatory Visit: Payer: Self-pay | Admitting: *Deleted

## 2021-11-15 DIAGNOSIS — N1330 Unspecified hydronephrosis: Secondary | ICD-10-CM | POA: Diagnosis not present

## 2021-11-15 DIAGNOSIS — L89623 Pressure ulcer of left heel, stage 3: Secondary | ICD-10-CM | POA: Diagnosis not present

## 2021-11-15 DIAGNOSIS — N189 Chronic kidney disease, unspecified: Secondary | ICD-10-CM | POA: Diagnosis not present

## 2021-11-15 DIAGNOSIS — J9611 Chronic respiratory failure with hypoxia: Secondary | ICD-10-CM | POA: Diagnosis not present

## 2021-11-15 DIAGNOSIS — I129 Hypertensive chronic kidney disease with stage 1 through stage 4 chronic kidney disease, or unspecified chronic kidney disease: Secondary | ICD-10-CM | POA: Diagnosis not present

## 2021-11-15 DIAGNOSIS — C670 Malignant neoplasm of trigone of bladder: Secondary | ICD-10-CM | POA: Diagnosis not present

## 2021-11-15 NOTE — Patient Outreach (Signed)
[facility]) Care Management Telephonic RN Care Manager Note   11/15/2021 Name:  Patrick Bishop MRN:  [id number] DOB:  May 11, 1941  Summary: Pt continue to work with [facility] services on mobility.  Recommendations/Changes made from today's visit: Updates documented with the plan of care.  Subjective: Patrick Bishop is an 81 y.o. year old male who is a primary patient of [name], [name], MD. The care management team was consulted for assistance with care management and/or care coordination needs.    Telephonic RN Care Manager completed Telephone Visit today.  Objective:   Medications Reviewed Today     Reviewed by Sharin Grave, RN (Registered Nurse) on 11/05/21 at 1207  Med List Status: Complete   Medication Order Taking? Sig Documenting Provider Last Dose Status Informant  acetaminophen (TYLENOL) 325 MG tablet [id number] No Take 2 tablets (650 mg total) by mouth every 6 (six) hours as needed for mild pain (or Fever >/= 101).  Patient not taking: Reported on 11/02/2021   [name], MD Not Taking Consider Medication Status and Discontinue Self  atorvastatin (LIPITOR) 10 MG tablet [id number] Yes TAKE 1 TABLET DAILY [name], MD 11/05/2021 0600 Active Self  cephALEXin (KEFLEX) 500 MG capsule [id number] Yes Take 500 mg by mouth 2 (two) times daily. [provider] 11/04/2021 Active Self           Med Note Caryn Section, Marylyn Ishihara A   Tue Nov 02, 2021 11:35 AM) Started 2/7 should finish around 2/13  docusate sodium (COLACE) 100 MG capsule [id number] No Take 1 capsule (100 mg total) by mouth 2 (two) times daily.  Patient not taking: Reported on 11/02/2021   [name], MD Not Taking Consider Medication Status and Discontinue Self  feeding supplement (ENSURE ENLIVE / ENSURE PLUS) LIQD [id number] Yes Take 237 mLs by mouth 3 (three) times daily between meals.  Patient taking differently: Take 237 mLs by mouth daily.   [name], MD 11/04/2021 Active  Self  HYDROcodone-acetaminophen (NORCO/VICODIN) 5-325 MG tablet [id number] Yes Take 1 tablet by mouth every 6 (six) hours as needed for moderate pain. [name], MD 11/04/2021 Active Self  loperamide (IMODIUM A-D) 2 MG tablet [id number] Yes Take 2-4 mg by mouth 4 (four) times daily as needed for diarrhea or loose stools. [provider] 11/03/2021 Active Self  metoprolol tartrate (LOPRESSOR) 25 MG tablet [id number] Yes Take 25-37.5 mg by mouth See admin instructions. Take 25 mg by mouth in the morning then 37.5 (1.5 tablets) in the evening [provider] 11/05/2021 0600 Active Self  Nystatin (GERHARDT'S BUTT CREAM) CREA [id number] No Apply 1 application topically 3 (three) times daily.  Patient not taking: Reported on 11/02/2021   [name], MD Not Taking Consider Medication Status and Discontinue Self  pantoprazole (PROTONIX) 40 MG tablet [id number] No Take 1 tablet (40 mg total) by mouth 2 (two) times daily.  Patient not taking: Reported on 11/02/2021   [name], MD Not Taking Consider Medication Status and Discontinue Self  potassium chloride SA (KLOR-CON) 20 MEQ tablet [id number] Yes Take 2 tablets (40 mEq total) by mouth daily.  Patient taking differently: Take 20 mEq by mouth 2 (two) times daily.   [name], MD 11/04/2021 Active Self  prochlorperazine (COMPAZINE) 10 MG tablet [id number] No TAKE 1 TABLET(10 MG) BY MOUTH EVERY 6 HOURS AS NEEDED FOR NAUSEA OR VOMITING  Patient not taking: Reported on 11/02/2021   [name], MD Not Taking Consider Medication Status and Discontinue Self  tamsulosin (  FLOMAX) 0.4 MG CAPS capsule [id number] Yes Take 0.4 mg by mouth daily. [provider] 11/05/2021 0600 Active Self  torsemide (DEMADEX) 20 MG tablet [id number] Yes Take 1 tablet (20 mg total) by mouth 2 (two) times daily.  Patient taking differently: Take 20-40 mg by mouth See admin instructions. Take 40 mg in the morning and 20 mg in the evening   [name], MD 11/04/2021 Active Self  XARELTO 20 MG TABS tablet [id number] Yes Take 1 tablet (20 mg total) by mouth daily with supper. [name], MD 11/04/2021 1700 Active Self           Med Note Caryn Section, Marylyn Ishihara A   Tue Nov 02, 2021 10:07 AM)               SDOH:  (Social Determinants of Health) assessments and interventions performed:     Care Plan  Review of patient past medical history, allergies, medications, health status, including review of consultants reports, laboratory and other test data, was performed as part of comprehensive evaluation for care management services.   Care Plan : RN Care Manager Plan of Care  Updates made by Tobi Bastos, RN since 11/15/2021 12:00 AM     Problem: Knowledge Deficit related to Hypertension   Priority: High     Long-Range Goal: Devvelopment of a Plan of Care for management of Hypertension   Start Date: 10/15/2021  Expected End Date: 03/25/2022  This Visit's Progress: On track  Priority: High  Note:   Current Barriers:  Knowledge Deficits related to plan of care for management of HTN   RNCM Clinical Goal(s):  Patient will verbalize understanding of plan for management of HTN as evidenced by teach-back method of understanding take all medications exactly as prescribed and will call provider for medication related questions as evidenced by self report and chart notification  through collaboration with RN Care manager, provider, and care team.   Interventions: Inter-disciplinary care team collaboration (see longitudinal plan of care) Evaluation of current treatment plan related to  self management and patient's adherence to plan as established by provider   Hypertension Interventions:  (Status:  Goal on track:  Yes.) Long Term Goal Last practice recorded BP readings:  BP Readings from Last 3 Encounters:  10/07/21 (!) 90/58  09/21/21 (!) 107/58  08/13/21 106/71  Most recent eGFR/CrCl:  Lab Results  Component Value Date    EGFR 27 (L) 07/22/2021    No components found for: CRCL  Evaluation of current treatment plan related to hypertension self management and patient's adherence to plan as established by provider Provided education to patient re: stroke prevention, s/s of heart attack and stroke Reviewed medications with patient and discussed importance of compliance Provided assistance with obtaining home blood pressure monitor via [facility] services will mail this DME to pt's confirmed address; Discussed plans with patient for ongoing care management follow up and provided patient with direct contact information for care management team Advised patient, providing education and rationale, to monitor blood pressure daily and record, calling PCP for findings outside established parameters Reviewed scheduled/upcoming provider appointments including:  Discussed complications of poorly controlled blood pressure such as heart disease, stroke, circulatory complications, vision complications, kidney impairment, sexual dysfunction Screening for signs and symptoms of depression related to chronic disease state  Assessed social determinant of health barriers  Update 2/20-Spoke with pt and permitted to speak with the caregiver in the home Aldean Ast in the home. Reports pt is doing well with ongoing  [facility] in the home for PT/OT/RN/Aide services. Denies any falls however pt is very limited with his mobility unable to stand or walk at this time. Reports good blood pressures from the providers with no acute issues or events. Will reiterate on the discussed plan of care and continue to offer available services for social worker or pharmacy with ongoing needs however encouraged caregiver to outreach to the involved agency while active with [facility] if these services are available. Verified all appointments with a recent follow with urologist post op stent placed.  Patient Goals/Self-Care Activities: Take all medications as  prescribed Attend all scheduled provider appointments Call pharmacy for medication refills 3-7 days in advance of running out of medications Attend church or other social activities Perform all self care activities independently  Perform IADL's (shopping, preparing meals, housekeeping, managing finances) independently Call provider office for new concerns or questions  check blood pressure weekly choose a place to take my blood pressure (home, clinic or office, retail store) write blood pressure results in a log or diary learn about high blood pressure keep a blood pressure log take blood pressure log to all doctor appointments call doctor for signs and symptoms of high blood pressure keep all doctor appointments take medications for blood pressure exactly as prescribed report new symptoms to your doctor  Follow Up Plan:  Telephone follow up appointment with care management team member scheduled for:  March 2023 The patient has been provided with contact information for the care management team and has been advised to call with any health related questions or concerns.        Raina Mina, RN Care Management Coordinator [place] Office [phone]

## 2021-11-17 DIAGNOSIS — N189 Chronic kidney disease, unspecified: Secondary | ICD-10-CM | POA: Diagnosis not present

## 2021-11-17 DIAGNOSIS — J9611 Chronic respiratory failure with hypoxia: Secondary | ICD-10-CM | POA: Diagnosis not present

## 2021-11-17 DIAGNOSIS — I129 Hypertensive chronic kidney disease with stage 1 through stage 4 chronic kidney disease, or unspecified chronic kidney disease: Secondary | ICD-10-CM | POA: Diagnosis not present

## 2021-11-17 DIAGNOSIS — N1330 Unspecified hydronephrosis: Secondary | ICD-10-CM | POA: Diagnosis not present

## 2021-11-17 DIAGNOSIS — C670 Malignant neoplasm of trigone of bladder: Secondary | ICD-10-CM | POA: Diagnosis not present

## 2021-11-17 DIAGNOSIS — L89623 Pressure ulcer of left heel, stage 3: Secondary | ICD-10-CM | POA: Diagnosis not present

## 2021-11-18 DIAGNOSIS — N130 Hydronephrosis with ureteropelvic junction obstruction: Secondary | ICD-10-CM | POA: Diagnosis not present

## 2021-11-18 DIAGNOSIS — C670 Malignant neoplasm of trigone of bladder: Secondary | ICD-10-CM | POA: Diagnosis not present

## 2021-11-19 ENCOUNTER — Inpatient Hospital Stay (HOSPITAL_COMMUNITY)
Admission: EM | Admit: 2021-11-19 | Discharge: 2021-11-23 | DRG: 872 | Disposition: A | Discharge status: Home Health | Payer: Medicare Other | Attending: Internal Medicine | Admitting: Internal Medicine

## 2021-11-19 ENCOUNTER — Encounter (HOSPITAL_COMMUNITY): Payer: Self-pay

## 2021-11-19 ENCOUNTER — Emergency Department (HOSPITAL_COMMUNITY): Payer: Medicare Other

## 2021-11-19 DIAGNOSIS — Z87891 Personal history of nicotine dependence: Secondary | ICD-10-CM

## 2021-11-19 DIAGNOSIS — N390 Urinary tract infection, site not specified: Secondary | ICD-10-CM | POA: Diagnosis present

## 2021-11-19 DIAGNOSIS — N179 Acute kidney failure, unspecified: Secondary | ICD-10-CM | POA: Diagnosis not present

## 2021-11-19 DIAGNOSIS — I517 Cardiomegaly: Secondary | ICD-10-CM | POA: Diagnosis not present

## 2021-11-19 DIAGNOSIS — I872 Venous insufficiency (chronic) (peripheral): Secondary | ICD-10-CM | POA: Diagnosis not present

## 2021-11-19 DIAGNOSIS — J9611 Chronic respiratory failure with hypoxia: Secondary | ICD-10-CM | POA: Diagnosis not present

## 2021-11-19 DIAGNOSIS — Z20822 Contact with and (suspected) exposure to covid-19: Secondary | ICD-10-CM | POA: Diagnosis present

## 2021-11-19 DIAGNOSIS — Z923 Personal history of irradiation: Secondary | ICD-10-CM

## 2021-11-19 DIAGNOSIS — Z7901 Long term (current) use of anticoagulants: Secondary | ICD-10-CM | POA: Diagnosis not present

## 2021-11-19 DIAGNOSIS — Z86010 Personal history of colonic polyps: Secondary | ICD-10-CM | POA: Diagnosis not present

## 2021-11-19 DIAGNOSIS — Z1612 Extended spectrum beta lactamase (ESBL) resistance: Secondary | ICD-10-CM | POA: Diagnosis present

## 2021-11-19 DIAGNOSIS — I4821 Permanent atrial fibrillation: Secondary | ICD-10-CM | POA: Diagnosis present

## 2021-11-19 DIAGNOSIS — J9811 Atelectasis: Secondary | ICD-10-CM | POA: Diagnosis not present

## 2021-11-19 DIAGNOSIS — R319 Hematuria, unspecified: Secondary | ICD-10-CM | POA: Diagnosis not present

## 2021-11-19 DIAGNOSIS — Z8249 Family history of ischemic heart disease and other diseases of the circulatory system: Secondary | ICD-10-CM

## 2021-11-19 DIAGNOSIS — L89623 Pressure ulcer of left heel, stage 3: Secondary | ICD-10-CM | POA: Diagnosis not present

## 2021-11-19 DIAGNOSIS — I129 Hypertensive chronic kidney disease with stage 1 through stage 4 chronic kidney disease, or unspecified chronic kidney disease: Secondary | ICD-10-CM | POA: Diagnosis not present

## 2021-11-19 DIAGNOSIS — Z743 Need for continuous supervision: Secondary | ICD-10-CM | POA: Diagnosis not present

## 2021-11-19 DIAGNOSIS — N1832 Chronic kidney disease, stage 3b: Secondary | ICD-10-CM | POA: Diagnosis present

## 2021-11-19 DIAGNOSIS — A419 Sepsis, unspecified organism: Secondary | ICD-10-CM | POA: Diagnosis not present

## 2021-11-19 DIAGNOSIS — R6520 Severe sepsis without septic shock: Secondary | ICD-10-CM | POA: Diagnosis not present

## 2021-11-19 DIAGNOSIS — Z79899 Other long term (current) drug therapy: Secondary | ICD-10-CM | POA: Diagnosis not present

## 2021-11-19 DIAGNOSIS — R42 Dizziness and giddiness: Secondary | ICD-10-CM | POA: Diagnosis not present

## 2021-11-19 DIAGNOSIS — Z66 Do not resuscitate: Secondary | ICD-10-CM | POA: Diagnosis present

## 2021-11-19 DIAGNOSIS — I739 Peripheral vascular disease, unspecified: Secondary | ICD-10-CM | POA: Diagnosis present

## 2021-11-19 DIAGNOSIS — Z9221 Personal history of antineoplastic chemotherapy: Secondary | ICD-10-CM

## 2021-11-19 DIAGNOSIS — I10 Essential (primary) hypertension: Secondary | ICD-10-CM | POA: Diagnosis present

## 2021-11-19 DIAGNOSIS — C670 Malignant neoplasm of trigone of bladder: Secondary | ICD-10-CM | POA: Diagnosis not present

## 2021-11-19 DIAGNOSIS — I130 Hypertensive heart and chronic kidney disease with heart failure and stage 1 through stage 4 chronic kidney disease, or unspecified chronic kidney disease: Secondary | ICD-10-CM | POA: Diagnosis present

## 2021-11-19 DIAGNOSIS — Z95828 Presence of other vascular implants and grafts: Secondary | ICD-10-CM

## 2021-11-19 DIAGNOSIS — Z87442 Personal history of urinary calculi: Secondary | ICD-10-CM | POA: Diagnosis not present

## 2021-11-19 DIAGNOSIS — N189 Chronic kidney disease, unspecified: Secondary | ICD-10-CM | POA: Diagnosis not present

## 2021-11-19 DIAGNOSIS — E785 Hyperlipidemia, unspecified: Secondary | ICD-10-CM | POA: Diagnosis present

## 2021-11-19 DIAGNOSIS — A4151 Sepsis due to Escherichia coli [E. coli]: Secondary | ICD-10-CM | POA: Diagnosis present

## 2021-11-19 DIAGNOSIS — Z800 Family history of malignant neoplasm of digestive organs: Secondary | ICD-10-CM | POA: Diagnosis not present

## 2021-11-19 DIAGNOSIS — Z6831 Body mass index (BMI) 31.0-31.9, adult: Secondary | ICD-10-CM

## 2021-11-19 DIAGNOSIS — I5032 Chronic diastolic (congestive) heart failure: Secondary | ICD-10-CM | POA: Diagnosis present

## 2021-11-19 DIAGNOSIS — I959 Hypotension, unspecified: Secondary | ICD-10-CM | POA: Diagnosis not present

## 2021-11-19 DIAGNOSIS — N1330 Unspecified hydronephrosis: Secondary | ICD-10-CM | POA: Diagnosis not present

## 2021-11-19 DIAGNOSIS — R531 Weakness: Secondary | ICD-10-CM | POA: Diagnosis not present

## 2021-11-19 LAB — URINALYSIS, ROUTINE W REFLEX MICROSCOPIC
Bilirubin Urine: NEGATIVE
Glucose, UA: NEGATIVE mg/dL
Ketones, ur: NEGATIVE mg/dL
Nitrite: NEGATIVE
Protein, ur: 100 mg/dL — AB
RBC / HPF: >50 RBC/hpf — ABNORMAL HIGH (ref 0–5)
Specific Gravity, Urine: 1.011 (ref 1.005–1.030)
WBC, UA: >50 WBC/hpf — ABNORMAL HIGH (ref 0–5)
pH: 5 (ref 5.0–8.0)

## 2021-11-19 LAB — COMPREHENSIVE METABOLIC PANEL
ALT: 10 U/L (ref 0–44)
AST: 12 U/L — ABNORMAL LOW (ref 15–41)
Albumin: 3.2 g/dL — ABNORMAL LOW (ref 3.5–5.0)
Alkaline Phosphatase: 65 U/L (ref 38–126)
Anion gap: 8 (ref 5–15)
BUN: 25 mg/dL — ABNORMAL HIGH (ref 8–23)
CO2: 26 mmol/L (ref 22–32)
Calcium: 8.4 mg/dL — ABNORMAL LOW (ref 8.9–10.3)
Chloride: 106 mmol/L (ref 98–111)
Creatinine, Ser: 2.05 mg/dL — ABNORMAL HIGH (ref 0.61–1.24)
GFR, Estimated: 32 mL/min — ABNORMAL LOW (ref >60)
Glucose, Bld: 107 mg/dL — ABNORMAL HIGH (ref 70–99)
Potassium: 4 mmol/L (ref 3.5–5.1)
Sodium: 140 mmol/L (ref 135–145)
Total Bilirubin: 0.6 mg/dL (ref 0.3–1.2)
Total Protein: 6.3 g/dL — ABNORMAL LOW (ref 6.5–8.1)

## 2021-11-19 LAB — LACTIC ACID, PLASMA
Lactic Acid, Venous: 2.2 mmol/L (ref 0.5–1.9)
Lactic Acid, Venous: 1.1 mmol/L (ref 0.5–1.9)

## 2021-11-19 LAB — CBC
HCT: 31.6 % — ABNORMAL LOW (ref 39.0–52.0)
Hemoglobin: 9.9 g/dL — ABNORMAL LOW (ref 13.0–17.0)
MCH: 30.8 pg (ref 26.0–34.0)
MCHC: 31.3 g/dL (ref 30.0–36.0)
MCV: 98.4 fL (ref 80.0–100.0)
Platelets: 216 10*3/uL (ref 150–400)
RBC: 3.21 MIL/uL — ABNORMAL LOW (ref 4.22–5.81)
RDW: 15.3 % (ref 11.5–15.5)
WBC: 4 10*3/uL (ref 4.0–10.5)
nRBC: 0 % (ref 0.0–0.2)

## 2021-11-19 MED ORDER — DOCUSATE SODIUM 100 MG PO CAPS
100.0000 mg | ORAL_CAPSULE | Freq: Two times a day (BID) | ORAL | Status: DC
  Administered 2021-11-19 – 2021-11-22 (×6): 100 mg via ORAL
  Filled 2021-11-19 (×6): qty 1

## 2021-11-19 MED ORDER — LACTATED RINGERS IV BOLUS
1000.0000 mL | Freq: Once | INTRAVENOUS | Status: AC
  Administered 2021-11-19: 1000 mL via INTRAVENOUS

## 2021-11-19 MED ORDER — ONDANSETRON HCL 4 MG PO TABS
4.0000 mg | ORAL_TABLET | Freq: Four times a day (QID) | ORAL | Status: DC | PRN
Start: 2021-11-19 — End: 2021-11-23

## 2021-11-19 MED ORDER — PANTOPRAZOLE SODIUM 40 MG PO TBEC
40.0000 mg | DELAYED_RELEASE_TABLET | Freq: Two times a day (BID) | ORAL | Status: DC
  Administered 2021-11-19 – 2021-11-23 (×8): 40 mg via ORAL
  Filled 2021-11-19 (×7): qty 1

## 2021-11-19 MED ORDER — VANCOMYCIN HCL IN DEXTROSE 1-5 GM/200ML-% IV SOLN
1000.0000 mg | Freq: Once | INTRAVENOUS | Status: AC
  Administered 2021-11-19: 1000 mg via INTRAVENOUS
  Filled 2021-11-19: qty 200

## 2021-11-19 MED ORDER — SODIUM CHLORIDE 0.9% FLUSH
3.0000 mL | Freq: Two times a day (BID) | INTRAVENOUS | Status: DC
  Administered 2021-11-19 – 2021-11-22 (×4): 3 mL via INTRAVENOUS

## 2021-11-19 MED ORDER — SODIUM CHLORIDE 0.9% FLUSH
3.0000 mL | INTRAVENOUS | Status: DC | PRN
Start: 2021-11-19 — End: 2021-11-23

## 2021-11-19 MED ORDER — ATORVASTATIN CALCIUM 10 MG PO TABS
10.0000 mg | ORAL_TABLET | Freq: Every day | ORAL | Status: DC
  Administered 2021-11-19 – 2021-11-23 (×5): 10 mg via ORAL
  Filled 2021-11-19 (×5): qty 1

## 2021-11-19 MED ORDER — PIPERACILLIN-TAZOBACTAM 3.375 G IVPB
3.3750 g | Freq: Three times a day (TID) | INTRAVENOUS | Status: DC
  Administered 2021-11-20 – 2021-11-22 (×7): 3.375 g via INTRAVENOUS
  Filled 2021-11-19 (×9): qty 50

## 2021-11-19 MED ORDER — HYDROCODONE-ACETAMINOPHEN 5-325 MG PO TABS
1.0000 | ORAL_TABLET | Freq: Four times a day (QID) | ORAL | Status: DC | PRN
  Administered 2021-11-19: 1 via ORAL
  Filled 2021-11-19: qty 1

## 2021-11-19 MED ORDER — LACTATED RINGERS IV SOLN: INTRAVENOUS | Status: DC

## 2021-11-19 MED ORDER — TAMSULOSIN HCL 0.4 MG PO CAPS
0.4000 mg | ORAL_CAPSULE | Freq: Every day | ORAL | Status: DC
Start: 2021-11-20 — End: 2021-11-23
  Administered 2021-11-20 – 2021-11-23 (×4): 0.4 mg via ORAL
  Filled 2021-11-19 (×4): qty 1

## 2021-11-19 MED ORDER — ONDANSETRON HCL 4 MG/2ML IJ SOLN
4.0000 mg | Freq: Four times a day (QID) | INTRAMUSCULAR | Status: DC | PRN
Start: 2021-11-19 — End: 2021-11-23

## 2021-11-19 MED ORDER — SODIUM CHLORIDE 0.9 % IV SOLN: 250.0000 mL | INTRAVENOUS | Status: DC | PRN

## 2021-11-19 MED ORDER — METOPROLOL TARTRATE 25 MG PO TABS
25.0000 mg | ORAL_TABLET | Freq: Every day | ORAL | Status: DC
  Administered 2021-11-20 – 2021-11-23 (×4): 25 mg via ORAL
  Filled 2021-11-19 (×4): qty 1

## 2021-11-19 MED ORDER — SODIUM CHLORIDE 0.9 % IV SOLN
2.0000 g | Freq: Once | INTRAVENOUS | Status: AC
  Administered 2021-11-19: 2 g via INTRAVENOUS
  Filled 2021-11-19: qty 2

## 2021-11-19 MED ORDER — METOPROLOL TARTRATE 25 MG PO TABS
37.5000 mg | ORAL_TABLET | Freq: Every day | ORAL | Status: DC
  Administered 2021-11-19 – 2021-11-20 (×2): 37.5 mg via ORAL
  Filled 2021-11-19 (×4): qty 2

## 2021-11-19 MED ORDER — RIVAROXABAN 15 MG PO TABS
15.0000 mg | ORAL_TABLET | Freq: Every day | ORAL | Status: DC
  Administered 2021-11-19: 15 mg via ORAL
  Filled 2021-11-19: qty 1

## 2021-11-19 MED ORDER — METRONIDAZOLE 500 MG/100ML IV SOLN
500.0000 mg | Freq: Once | INTRAVENOUS | Status: AC
  Administered 2021-11-19: 500 mg via INTRAVENOUS
  Filled 2021-11-19: qty 100

## 2021-11-19 MED ORDER — LACTATED RINGERS IV BOLUS (SEPSIS)
1000.0000 mL | Freq: Once | INTRAVENOUS | Status: AC
  Administered 2021-11-19: 1000 mL via INTRAVENOUS

## 2021-11-19 MED ORDER — RIVAROXABAN 20 MG PO TABS: 20.0000 mg | ORAL_TABLET | Freq: Every day | ORAL | Status: DC

## 2021-11-19 MED ORDER — POLYETHYLENE GLYCOL 3350 17 G PO PACK: 17.0000 g | PACK | Freq: Every day | ORAL | Status: DC | PRN

## 2021-11-19 NOTE — ED Provider Notes (Signed)
[facility] DEPT Provider Note   CSN: [id number] Arrival date & time: 11/19/21  1107     History  Chief Complaint  Patient presents with   Hypotension   Dizziness    Patrick Bishop is a 81 y.o. male.  HPI 81 yo male ho bladder cancer,hypertension, presents today with hypotension and weakness.  Patient states he has had poor po intake for months and has lost weight due to poor appetite.  He states he was seen at urologist office yesterday and bp was low and told to come to ED.  He felt too weak at that time.  Presents today via ems after rehab nurse evaluated and noted low blood pressure. He denies headache, head injury, chest pain, no sob, no abdomianl pain.  He reports early satiety.Patient reports trouble walking for a long time-  He states he has no control over bowel or bladder, but denies back pain or sensation loss.  This has been going on for an extended period of time.  He no longer walks since last round of cancer.  He lives in independent living with GF as assist.     Home Medications Prior to Admission medications   Medication Sig Start Date End Date Taking? Authorizing Provider  atorvastatin (LIPITOR) 10 MG tablet TAKE 1 TABLET DAILY Patient taking differently: Take 10 mg by mouth daily. 10/25/21  Yes [name], MD  feeding supplement (ENSURE ENLIVE / ENSURE PLUS) LIQD Take 237 mLs by mouth 3 (three) times daily between meals. Patient taking differently: Take 237 mLs by mouth daily. 09/21/21  Yes Danford, Suann Larry, MD  HYDROcodone-acetaminophen (NORCO/VICODIN) 5-325 MG tablet Take 1 tablet by mouth every 6 (six) hours as needed for moderate pain. 09/21/21  Yes Danford, Suann Larry, MD  loperamide (IMODIUM A-D) 2 MG tablet Take 2-4 mg by mouth 4 (four) times daily as needed for diarrhea or loose stools.   Yes [provider]  metoprolol tartrate (LOPRESSOR) 25 MG tablet Take 25-37.5 mg by mouth See admin instructions.  Take 25 mg by mouth in the morning then 37.5 (1.5 tablets) in the evening 01/08/21  Yes [provider]  potassium chloride SA (KLOR-CON) 20 MEQ tablet Take 2 tablets (40 mEq total) by mouth daily. Patient taking differently: Take 40 mEq by mouth every morning. 08/13/21  Yes [name], MD  tamsulosin (FLOMAX) 0.4 MG CAPS capsule Take 0.4 mg by mouth daily.   Yes [provider]  torsemide (DEMADEX) 20 MG tablet Take 1 tablet (20 mg total) by mouth 2 (two) times daily. 09/21/21  Yes Danford, Suann Larry, MD  XARELTO 20 MG TABS tablet Take 1 tablet (20 mg total) by mouth daily with supper. 08/19/21  Yes [name], MD  acetaminophen (TYLENOL) 325 MG tablet Take 2 tablets (650 mg total) by mouth every 6 (six) hours as needed for mild pain (or Fever >/= 101). Patient not taking: Reported on 11/02/2021 09/21/21   [name], MD  docusate sodium (COLACE) 100 MG capsule Take 1 capsule (100 mg total) by mouth 2 (two) times daily. Patient not taking: Reported on 11/02/2021 08/13/21   [name], MD  Nystatin (GERHARDT'S BUTT CREAM) CREA Apply 1 application topically 3 (three) times daily. Patient not taking: Reported on 11/02/2021 08/13/21   [name], MD  pantoprazole (PROTONIX) 40 MG tablet Take 1 tablet (40 mg total) by mouth 2 (two) times daily. Patient not taking: Reported on 11/02/2021 08/13/21 08/13/22  [name], MD  prochlorperazine (  COMPAZINE) 10 MG tablet TAKE 1 TABLET(10 MG) BY MOUTH EVERY 6 HOURS AS NEEDED FOR NAUSEA OR VOMITING Patient not taking: Reported on 11/02/2021 12/14/20   [name], MD      Allergies    Aldactone [spironolactone]    Review of Systems   Review of Systems  Constitutional:  Positive for appetite change, fatigue and unexpected weight change. Negative for chills and fever.  Respiratory:  Negative for cough and shortness of breath.   Cardiovascular:  Negative for chest pain.   Physical Exam Updated Vital Signs BP 110/66     Pulse (!) 40    Temp 97.6 F (36.4 C) (Rectal)    Resp 15    Ht 1.854 m ($Remove'6\' 1"'blOKYfj$ )    Wt 108.9 kg    SpO2 100%    BMI 31.67 kg/m  Physical Exam Constitutional:      Appearance: Normal appearance.  HENT:     Head: Normocephalic.     Right Ear: External ear normal.     Left Ear: External ear normal.     Nose: Nose normal.     Mouth/Throat:     Pharynx: Oropharynx is clear.  Eyes:     Pupils: Pupils are equal, round, and reactive to light.  Cardiovascular:     Rate and Rhythm: Normal rate and regular rhythm.     Pulses: Normal pulses.  Pulmonary:     Effort: Pulmonary effort is normal.     Breath sounds: Normal breath sounds.  Abdominal:     General: Abdomen is flat. Bowel sounds are normal.     Palpations: Abdomen is soft.  Musculoskeletal:        General: Normal range of motion.     Cervical back: Normal range of motion.  Skin:    General: Skin is warm and dry.     Capillary Refill: Capillary refill takes less than 2 seconds.  Neurological:     General: No focal deficit present.     Mental Status: He is alert.  Psychiatric:        Mood and Affect: Mood normal.    ED Results / Procedures / Treatments   Labs (all labs ordered are listed, but only abnormal results are displayed) Labs Reviewed  CBC - Abnormal; Notable for the following components:      Result Value   RBC 3.21 (*)    Hemoglobin 9.9 (*)    HCT 31.6 (*)    All other components within normal limits  COMPREHENSIVE METABOLIC PANEL - Abnormal; Notable for the following components:   Glucose, Bld 107 (*)    BUN 25 (*)    Creatinine, Ser 2.05 (*)    Calcium 8.4 (*)    Total Protein 6.3 (*)    Albumin 3.2 (*)    AST 12 (*)    GFR, Estimated 32 (*)    All other components within normal limits  URINALYSIS, ROUTINE W REFLEX MICROSCOPIC - Abnormal; Notable for the following components:   Color, Urine RED (*)    APPearance TURBID (*)    Hgb urine dipstick LARGE (*)    Protein, ur 100 (*)    Leukocytes,Ua  LARGE (*)    RBC / HPF >50 (*)    WBC, UA >50 (*)    Bacteria, UA FEW (*)    All other components within normal limits  LACTIC ACID, PLASMA - Abnormal; Notable for the following components:   Lactic Acid, Venous 2.2 (*)    All  other components within normal limits  CULTURE, BLOOD (ROUTINE X 2)  CULTURE, BLOOD (ROUTINE X 2)  LACTIC ACID, PLASMA    EKG EKG Interpretation  Date/Time:  Friday November 19 2021 12:27:07 EST Ventricular Rate:  63 PR Interval:    QRS Duration: 96 QT Interval:  543 QTC Calculation: 556 R Axis:   9 Text Interpretation: Atrial fibrillation Low voltage, precordial leads Abnormal R-wave progression, early transition Prolonged QT interval Confirmed by Pattricia Boss [phone]) on 11/19/2021 4:16:43 PM  Radiology DG Chest Port 1 View  Result Date: 11/19/2021 CLINICAL DATA:  81 year old male with weakness. EXAM: PORTABLE CHEST - 1 VIEW COMPARISON:  09/16/2021 FINDINGS: The mediastinal contours are within normal limits. Unchanged cardiomegaly. Low lung volumes. Similar appearing chronic elevation the right hemidiaphragm. Mild bibasilar streaky opacities. No pleural effusion or pneumothorax. No acute osseous abnormality. IMPRESSION: Low lung volumes with bibasilar subsegmental atelectasis. Electronically Signed   By: Ruthann Cancer M.D.   On: 11/19/2021 12:41    Procedures Procedures    Medications Ordered in ED Medications  lactated ringers infusion (has no administration in time range)  ceFEPIme (MAXIPIME) 2 g in sodium chloride 0.9 % 100 mL IVPB (2 g Intravenous New Bag/Given 11/19/21 1611)  metroNIDAZOLE (FLAGYL) IVPB 500 mg (500 mg Intravenous New Bag/Given 11/19/21 1610)  vancomycin (VANCOCIN) IVPB 1000 mg/200 mL premix (1,000 mg Intravenous New Bag/Given 11/19/21 1616)  lactated ringers bolus 1,000 mL (1,000 mLs Intravenous New Bag/Given 11/19/21 1608)  lactated ringers bolus 1,000 mL (0 mLs Intravenous Stopped 11/19/21 1608)    ED Course/ Medical Decision  Making/ A&P Clinical Course as of 11/19/21 1637  Fri Nov 19, 2021  1624 CBC reviewed and significant for anemia which appears stable from prior No leukocytosis noted [DR]  1625 Comprehensive metabolic panel(!) C-Met reviewed and significant for acute kidney injury [DR]  1625 Urinalysis reviewed and interpreted and consistent with urinary tract infection with greater than 50 white blood cells and greater than 50 red blood cell [DR]    Clinical Course User Index [DR] Pattricia Boss, MD                           Medical Decision Making 81 year old man history of bladder cancer and severe deconditioning presents today complaining of increased weakness, fatigue, and hypotension.  He was seen in his urologist office yesterday and was told that he was hypotensive in the to be evaluated.  Patient's initial blood pressure here was 89/60.  He has had IV fluids infusing.  On initial evaluation patient had broad-spectrum antibiotics ordered due to concern for sepsis.  Initial lactic acid was elevated at 2.2.  Lactic acid has cleared after IV fluids and antibiotics Patient received IV fluids 1 L of lactated Ringer's and prehospital fluids and has had improved blood pressure Chest x-Sheyli Horwitz appears clear Creatinine is increased today to 2.05 from first prior of 1.86.  It was elevated during prior hospitalization December to January when he was admitted with urinary tract infection.  Discussed care with Dr. Olevia Bowens who will see for admission  Hypotension may be secondary to infection and volume depletion patient had lactic that has cleared Patient with AKI with baseline CKD Urinalysis is urinalysis consistent with UTI with greater than 50 white cells and greater  EKG consistent with patient's history of atrial fibrillation Plan admission for ongoing treatment and evaluation than 50 red blood cells   Amount and/or Complexity of Data Reviewed Labs: ordered. Decision-making details documented  in ED  Course. Radiology: ordered and independent interpretation performed. Decision-making details documented in ED Course. ECG/medicine tests: ordered and independent interpretation performed. Decision-making details documented in ED Course.    Details: Patient on monitor and is in A-fib with heart rate in the 80s Blood pressure has improved from upper 80s to 110 Patien reviewed IV fluids and IV antibiotic t  Risk Prescription drug management. Decision regarding hospitalization. Risk Details: CRITICAL CARE Performed by: Pattricia Boss Total critical care time: 45 minutes Critical care time was exclusive of separately billable procedures and treating other patients. Critical care was necessary to treat or prevent imminent or life-threatening deterioration. Critical care was time spent personally by me on the following activities: development of treatment plan with patient and/or surrogate as well as nursing, discussions with consultants, evaluation of patient's response to treatment, examination of patient, obtaining history from patient or surrogate, ordering and performing treatments and interventions, ordering and review of laboratory studies, ordering and review of radiographic studies, pulse oximetry and re-evaluation of patient's condition.   Critical Care Total time providing critical care: 30-74 minutes         Final Clinical Impression(s) / ED Diagnoses Final diagnoses:  Urinary tract infection with hematuria, site unspecified  AKI (acute kidney injury) ([place])  Hypotension, unspecified hypotension type    Rx / DC Orders ED Discharge Orders     None         Pattricia Boss, MD 11/19/21 1637

## 2021-11-19 NOTE — ED Triage Notes (Signed)
BIB EMS from [facility] independent [facility], EMS reports FTT x1 month w/ poor PO intake. Over the past 2-3 days has had hypotension. Unclear but pt might be wheelchair bound.   Laying flat 130/60, when he sat up it went to 92/60, pulse 80s.   16 G LAC, 250 cc NS given by EMS, last BP 103/66.

## 2021-11-19 NOTE — Progress Notes (Signed)
[facility] following for sepsis protocol. 

## 2021-11-19 NOTE — H&P (Addendum)
History and Physical  [name] KPT:[id number] DOB: [birth year] DOA: 11/19/2021  PCP: Lujean Amel, MD Patient coming from: [facility]  I have personally briefly reviewed patient's old medical records in [place]   Chief Complaint: Hypotension  HPI: Patrick Bishop is a 81 y.o. male past medical history significant for atrial fibrillation on Xarelto, chronic diastolic heart failure, essential hypertension, urothelial carcinoma T2N0 who recently had a cystoscopy with left ureteral stent exchange with retrograde pyelogram intraoperatively, status post chemo and radiation with his first treatment in 2022, which he is no longer getting chemotherapy or radiation therapy for, who comes in for hypotension after being seen by the urologist on the day prior to admission, he was told to come into the ED but went home.  He relates he has had significant p.o. oral intake for the last several days with loss of appetite and weight lost felt too weak yesterday but still decided to go home denies any fever headache nausea vomiting or abdominal pain.  He relates no loss of control sphincters and his sensation is intact he felt so weak that he decided to come to the ED today.  In the ED: He was found to be afebrile hypotensive in the 90s satting 100% on room air with a heart rate of 60 was aggressively fluid resuscitated started empirically on antibiotics, on presentation his lactate was 2.2 after fluid resuscitation and antibiotics which cleared, his blood pressure improved to 120.   Review of Systems: All systems reviewed and apart from history of presenting illness, are negative.  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  Also s/p Bilateral GSV Ablation.   Dizziness    Dyspnea    Essential hypertension    History of kidney stones    Hx of adenomatous colonic polyps    Hyperlipidemia with target LDL less than 100    Longstanding persistent atrial fibrillation ([place])    Nephrolithiasis 05/2019   CKD stage 3   Obesity    OSA on CPAP    does not cpap    Pneumonia 08/2019   Pre-diabetes    Pulmonary hypertension ([place])    Related to OSA, partial component of HFpEF   Pyelonephritis 05/2019   Tumor    Left arm   Past Surgical History:  Procedure Laterality Date   ABDOMINAL VENOUS DUPLEX Bilateral 07/2014   ([place]: Dr. Cleda Mccreedy): (prior to Bilateral Iliac-Femoral Venous Stent Placement): Patent IVC.  No DVT B/L Iliac or Com fem V..  +++ DEEP V REFLUX B/L ILIAC & FEM V.  Absent Superficial V Reflux B/L x L SSV (too tortuous for intervention).  BL GSV occluded - prior ablation.    ABDOMINAL VENOUS DUPLEX Bilateral 11/2014   Absent DVT.  Left common femoral venous reflux: 1508 MS left common femoral vein, 2284MS left proximal femoral vein -> cystic structure noted in the left popliteal vein (7.6 x 1.4 x 3.8 cm).;  Right leg deep venous reflux noted in the distal iliac, common femoral, proximal profunda femoral, proximal superficial femoral, superficial femoral and popliteal  vein.   CARDIAC CATHETERIZATION  10/2009   [facility] - Dr. 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  EF~45%.  Mild pulmonary pretension.   CARDIAC PET  08/2009   Inferolateral partially reversible defect -- > false positive by cath   COLONOSCOPY     CYSTOSCOPY W/ URETERAL STENT PLACEMENT Left 01/19/2021   Procedure: CYSTOSCOPY WITH STENT EXCHANGE;  Surgeon: Ceasar Mons, MD;  Location: [facility];  Service: Urology;  Laterality: Left;  ONLY NEEDS 30 MIN    CYSTOSCOPY W/ URETERAL STENT PLACEMENT Left 11/05/2021   Procedure: CYSTOSCOPY WITH STENT EXCHANGE/ RETROGRADE;  Surgeon: Ceasar Mons, MD;  Location: [facility];  Service: Urology;  Laterality: Left;  ONLY NEEDS 30 MIN left stent removed   CYSTOSCOPY WITH STENT PLACEMENT Left 05/12/2021   Procedure: CYSTOSCOPY WITH LEFT STENT EXCHANGE;  Surgeon: Ceasar Mons, MD;  Location: [facility];  Service: Urology;  Laterality: Left;  ONLY NEEDS 30 MIN   CYSTOSCOPY/URETEROSCOPY/HOLMIUM LASER/STENT PLACEMENT Left 11/06/2019   Procedure: CYSTOSCOPY/RETROGRADE/URETEROSCOPY/HOLMIUM LASER/STENT PLACEMENT. REMOVAL NEPHROSCOPYTUBE;  Surgeon: Ceasar Mons, MD;  Location: [facility];  Service: Urology;  Laterality: Left;   ESOPHAGOGASTRODUODENOSCOPY     ESOPHAGOGASTRODUODENOSCOPY N/A 08/11/2021   Procedure: ESOPHAGOGASTRODUODENOSCOPY (EGD);  Surgeon: Arta Silence, MD;  Location: [facility];  Service: Endoscopy;  Laterality: N/A;   GREATER SAPHENOUS VEIN ABLATION Bilateral Before 2015   [facility] (Dr. Eugenie Filler)   IR NEPHROSTOMY EXCHANGE LEFT  08/28/2019   IR NEPHROSTOMY PLACEMENT LEFT  06/24/2019   IR TRANSCATH PLC STENT  EA ADD VEIN  INC ANGIOPLASTY Left 10/2014   ([place], Dr. 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 Stormy Fabian): US Guided --> R Common & External Iliac, Common Femoral Venography with Placement of 24 mmx 70 mm Stent - R Com Iliac-Ext Iliac- & Com Fem V. reducing 60-80% venous compression to ~0%).;; also noted significant L-sided venous compression (staged stenting).    LIPOMA EXCISION     x 2 came back again   NM MYOVIEW LTD  11/2005   Aden[facility] Myov[facility]  - Dr. Cleda Mccreedy): Normal myocardial perfusion scan. No ischemia or infarction.   TRANSTHORACIC ECHOCARDIOGRAM  04/26/2017    [facility] - Dr. Cleda Mccreedy) - report not available.  Per clinic note: Normal global LV function. Mild concentric temperature. Mild TR. Mild pulmonary hypertension.;;    TRANSTHORACIC ECHOCARDIOGRAM  7/'15; 7/'17   [facility] - Dr. Saverio Danker) a) low normal LV function (EF 56%). Moderate concentric LVH. Mild LA dilation. Normal pulmonary pressures.  ;; b) normal global function - EF 60-65%. Moderate pulmonary hypertension. Biatrial enlargement. Moderate concentric LVH.   TRANSTHORACIC ECHOCARDIOGRAM  02/2010   [facility] - Dr. Saverio Danker): Normal global LV function. Mild LVH. EF 60%.; November 2010. Moderate LV dilation. Moderate LVH. Normal EF 50-55%. Mild to moderate pulmonary hypertension. (July 2012 showed normal function with mild LVH, mild MR noted in August 2014)   TRANSTHORACIC ECHOCARDIOGRAM  05/2019   During admission for UTI, sepsis with A. fib RVR: LV normal size and function.  EF 60-65%.  Mildly reduced RV size with severely reduced  function.  Despite this normal atrial size noted with just only mild right atrial enlargement with elevated RAP (dilated IVC).  Relatively poor TR jet, but findings do suggest elevated RV pressures.   TRANSURETHRAL RESECTION OF BLADDER TUMOR Left 10/21/2020   Procedure: TRANSURETHRAL RESECTION OF BLADDER TUMOR (TURBT)/ RETROGRADE/  URETERAL STENT PLACEMENT;  Surgeon: Ceasar Mons, MD;  Location: [facility];  Service: Urology;  Laterality: Left;   Social History:  reports that he quit smoking about 27 years ago. His smoking use included cigarettes. He has a 35.00 pack-year smoking history. He has never used smokeless tobacco. He reports current alcohol use. He reports that he does not use drugs.   Allergies  Allergen Reactions   Aldactone [Spironolactone] Other (See Comments)    Unknown reaction      Family History  Problem Relation Age of Onset   Colon cancer Mother    Heart attack Father 70   Hypertension Brother    Cancer Sister    Colon cancer Sister    Other Neg Hx        He really does not know much about his parents and siblings health, but they did not speak about healthcare    Prior to Admission medications   Medication Sig Start Date End Date Taking? Authorizing Provider  atorvastatin (LIPITOR) 10 MG tablet TAKE 1 TABLET DAILY Patient taking differently: Take 10 mg by mouth daily. 10/25/21  Yes [name], MD  feeding supplement (ENSURE ENLIVE / ENSURE PLUS) LIQD Take 237 mLs by mouth 3 (three) times daily between meals. Patient taking differently: Take 237 mLs by mouth daily. 09/21/21  Yes Danford, Suann Larry, MD  HYDROcodone-acetaminophen (NORCO/VICODIN) 5-325 MG tablet Take 1 tablet by mouth every 6 (six) hours as needed for moderate pain. 09/21/21  Yes Danford, Suann Larry, MD  loperamide (IMODIUM A-D) 2 MG tablet Take 2-4 mg by mouth 4 (four) times daily as needed for diarrhea or loose stools.   Yes [provider]  metoprolol tartrate (LOPRESSOR) 25 MG tablet Take 25-37.5 mg by mouth See admin instructions. Take 25 mg by mouth in the morning then 37.5 (1.5 tablets) in the evening 01/08/21  Yes [provider]  potassium chloride SA (KLOR-CON) 20 MEQ tablet Take 2 tablets (40 mEq total) by mouth daily. Patient taking differently: Take 40 mEq by mouth every morning. 08/13/21  Yes [name], MD  tamsulosin (FLOMAX) 0.4 MG CAPS capsule Take 0.4 mg by mouth daily.   Yes [provider]  torsemide (DEMADEX) 20 MG tablet Take 1 tablet (20 mg total) by mouth 2 (two) times daily. 09/21/21  Yes Danford, Suann Larry, MD  XARELTO 20 MG TABS tablet Take 1 tablet (20 mg total) by mouth daily with supper. 08/19/21  Yes [name], MD  acetaminophen (TYLENOL) 325 MG tablet Take 2 tablets (650 mg total) by mouth every 6 (six) hours as  needed for mild pain (or Fever >/= 101). Patient not taking: Reported on 11/02/2021 09/21/21   [name], MD  docusate sodium (COLACE) 100 MG capsule Take 1 capsule (100 mg total) by mouth 2 (two) times daily. Patient not taking: Reported on 11/02/2021 08/13/21   [name], MD  Nystatin (GERHARDT'S BUTT CREAM) CREA Apply 1 application topically 3 (three) times daily. Patient not taking: Reported on 11/02/2021 08/13/21   [name], MD  pantoprazole (PROTONIX) 40 MG tablet Take 1 tablet (40 mg total) by mouth 2 (two) times daily. Patient not taking: Reported on 11/02/2021  08/13/21 08/13/22  [name], MD  prochlorperazine (COMPAZINE) 10 MG tablet TAKE 1 TABLET(10 MG) BY MOUTH EVERY 6 HOURS AS NEEDED FOR NAUSEA OR VOMITING Patient not taking: Reported on 11/02/2021 12/14/20   [name], MD   Physical Exam: Vitals:   11/19/21 1345 11/19/21 1415 11/19/21 1545 11/19/21 1615  BP: 110/61 95/69 110/66   Pulse: 66 (!) 55 (!) 40   Resp: 15 10 15    Temp:      TempSrc:      SpO2: 99% 96% 100%   Weight:    108.9 kg  Height:    6\' 1"  (1.854 m)    General exam: Moderately built and nourished patient, lying comfortably supine on the gurney in no obvious distress. Head, eyes and ENT: Nontraumatic and normocephalic. Pupils equally reacting to light and accommodation. Oral mucosa moist. Neck: Supple. No JVD, carotid bruit or thyromegaly. Lymphatics: No lymphadenopathy. Respiratory system: Clear to auscultation. No increased work of breathing. Cardiovascular system: S1 and S2 heard, RRR. No JVD, murmurs, gallops, clicks or pedal edema. Gastrointestinal system: Abdomen is soft nondistended nontender. Central nervous system: Awake alert and oriented x4 hard of hearing Extremities: Venous chronic stasis changes with thick skin Skin: No rashes or acute findings. Musculoskeletal system: Negative exam. Psychiatry: Pleasant and cooperative.   Labs on Admission:  Basic Metabolic  Panel: Recent Labs  Lab 11/19/21 1248  NA 140  K 4.0  CL 106  CO2 26  GLUCOSE 107*  BUN 25*  CREATININE 2.05*  CALCIUM 8.4*   Liver Function Tests: Recent Labs  Lab 11/19/21 1248  AST 12*  ALT 10  ALKPHOS 65  BILITOT 0.6  PROT 6.3*  ALBUMIN 3.2*   No results for input(s): LIPASE, AMYLASE in the last 168 hours. No results for input(s): AMMONIA in the last 168 hours. CBC: Recent Labs  Lab 11/19/21 1248  WBC 4.0  HGB 9.9*  HCT 31.6*  MCV 98.4  PLT 216   Cardiac Enzymes: No results for input(s): CKTOTAL, CKMB, CKMBINDEX, TROPONINI in the last 168 hours.  BNP (last 3 results) No results for input(s): PROBNP in the last 8760 hours. CBG: No results for input(s): GLUCAP in the last 168 hours.  Radiological Exams on Admission: DG Chest Port 1 View  Result Date: 11/19/2021 CLINICAL DATA:  81 year old male with weakness. EXAM: PORTABLE CHEST - 1 VIEW COMPARISON:  09/16/2021 FINDINGS: The mediastinal contours are within normal limits. Unchanged cardiomegaly. Low lung volumes. Similar appearing chronic elevation the right hemidiaphragm. Mild bibasilar streaky opacities. No pleural effusion or pneumothorax. No acute osseous abnormality. IMPRESSION: Low lung volumes with bibasilar subsegmental atelectasis. Electronically Signed   By: Ruthann Cancer M.D.   On: 11/19/2021 12:41    EKG: Independently reviewed.  A-fib rate controlled normal axis nonspecific T wave changes  Assessment/Plan Sepsis due to acute UTI, in a patient with a recent cystoscopy with left ureteral stent exchange on 11/05/2021: He has been fluid resuscitated his blood pressure is improved. Agree with IV antibiotics he does have a history of multidrug-resistant Pseudomonas UTI. We will go ahead and start him on IV Zosyn and send for urine cultures and blood cultures. We will go ahead and hold IV fluids and continue to monitor his vitals. Recheck basic metabolic panel in the morning.  Chronic diastolic HF  (heart failure) (HCC) Appears euvolemic on physical exam Continue metoprolol hold torsemide. We will be judicious with IV fluids his blood pressure has trended up so we will KVO IV fluids.  Essential hypertension: Blood pressure seems to be stable at this time continue metoprolol hold diuretic therapy.  Permanent atrial fibrillation (HCC); CHA2DS2-VASc score =5 (agex2, aortic plaque, CHF, HTN): Continue metoprolol and Xarelto, will probably have to reduce dose of Xarelto was discussed with pharmacy. Heart rate seems to be controlled.  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    DVT Prophylaxis: Xarelto Code Status: DNR  Family Communication: none  Disposition Plan: inpatient   Time spent: 95 min    It is my clinical opinion that admission to INPATIENT is reasonable and necessary in this 81 y.o. male significant comorbidities of hypertension chronic diastolic heart failure with a recent urological intervention with stent exchange coming septic with a UTI  Given the aforementioned, the predictability of an adverse outcome is felt to be significant. I expect that the patient will require at least 2 midnights in the hospital to treat this condition.  [name] MD [facility]   11/19/2021, 4:48 PM

## 2021-11-19 NOTE — Progress Notes (Signed)
A consult was received from an ED physician for vancomycin and cefepime per pharmacy dosing.  The patient's profile has been reviewed for ht/wt/allergies/indication/available labs.  STAT ht/wt order entered.  Patient weight is not charted. Will give 1000 mg vancomycin dose since unable to calculate weight-based dose.  A one time order has been placed for cefepime 2 g + vancomycin 1000 mg IV once.    Further antibiotics/pharmacy consults should be ordered by admitting physician if indicated.                       Thank you, Lenis Noon, PharmD 11/19/2021  3:51 PM

## 2021-11-19 NOTE — Progress Notes (Signed)
Pharmacy Antibiotic Note  Patrick Bishop is a 81 y.o. male admitted on 11/19/2021 with sepsis. Pt received doses of cefepime, vancomycin in ED. Pharmacy has been consulted for piperacillin/tazobactam dosing.  Pt has PMH significant for urothelial carcinoma who recently underwent cystoscopy with ureteral stent exchange  Today, 11/19/21 WBC WNL SCr 2.05, CrCl ~35 mL/min  Plan: Piperacillin/tazobactam 3.375 g IV q8h EI Follow renal function and culture data  Height: 6\' 1"  (185.4 cm) Weight: 108.9 kg (240 lb 1.3 oz) IBW/kg (Calculated) : 79.9  Temp (24hrs), Avg:97.8 F (36.6 C), Min:97.6 F (36.4 C), Max:98 F (36.7 C)  Recent Labs  Lab 11/19/21 1248 11/19/21 1249 11/19/21 1550  WBC 4.0  --   --   CREATININE 2.05*  --   --   LATICACIDVEN  --  2.2* 1.1    Estimated Creatinine Clearance: 36.6 mL/min (A) (by C-G formula based on SCr of 2.05 mg/dL (H)).    Allergies  Allergen Reactions   Aldactone [Spironolactone] Other (See Comments)    Unknown reaction     Antimicrobials this admission: Piperacillin/tazobactam 2/24 >>  Cefepime, vancomycin, metronidazole x1 in ED on 2/24  Dose adjustments this admission:  Microbiology results: 2/24 BCx:   Lenis Noon, PharmD 11/19/2021 7:08 PM

## 2021-11-20 ENCOUNTER — Other Ambulatory Visit: Payer: Self-pay

## 2021-11-20 DIAGNOSIS — I5032 Chronic diastolic (congestive) heart failure: Secondary | ICD-10-CM

## 2021-11-20 DIAGNOSIS — N390 Urinary tract infection, site not specified: Secondary | ICD-10-CM | POA: Diagnosis not present

## 2021-11-20 DIAGNOSIS — R319 Hematuria, unspecified: Secondary | ICD-10-CM

## 2021-11-20 DIAGNOSIS — A419 Sepsis, unspecified organism: Secondary | ICD-10-CM | POA: Diagnosis not present

## 2021-11-20 DIAGNOSIS — N179 Acute kidney failure, unspecified: Secondary | ICD-10-CM | POA: Diagnosis not present

## 2021-11-20 LAB — CBC
HCT: 31.3 % — ABNORMAL LOW (ref 39.0–52.0)
Hemoglobin: 10 g/dL — ABNORMAL LOW (ref 13.0–17.0)
MCH: 31.3 pg (ref 26.0–34.0)
MCHC: 31.9 g/dL (ref 30.0–36.0)
MCV: 98.1 fL (ref 80.0–100.0)
Platelets: 179 10*3/uL (ref 150–400)
RBC: 3.19 MIL/uL — ABNORMAL LOW (ref 4.22–5.81)
RDW: 15.1 % (ref 11.5–15.5)
WBC: 4 10*3/uL (ref 4.0–10.5)
nRBC: 0 % (ref 0.0–0.2)

## 2021-11-20 LAB — COMPREHENSIVE METABOLIC PANEL
ALT: 9 U/L (ref 0–44)
AST: 12 U/L — ABNORMAL LOW (ref 15–41)
Albumin: 2.9 g/dL — ABNORMAL LOW (ref 3.5–5.0)
Alkaline Phosphatase: 54 U/L (ref 38–126)
Anion gap: 4 — ABNORMAL LOW (ref 5–15)
BUN: 22 mg/dL (ref 8–23)
CO2: 25 mmol/L (ref 22–32)
Calcium: 8.3 mg/dL — ABNORMAL LOW (ref 8.9–10.3)
Chloride: 109 mmol/L (ref 98–111)
Creatinine, Ser: 1.68 mg/dL — ABNORMAL HIGH (ref 0.61–1.24)
GFR, Estimated: 41 mL/min — ABNORMAL LOW (ref >60)
Glucose, Bld: 99 mg/dL (ref 70–99)
Potassium: 4.1 mmol/L (ref 3.5–5.1)
Sodium: 138 mmol/L (ref 135–145)
Total Bilirubin: 0.7 mg/dL (ref 0.3–1.2)
Total Protein: 5.7 g/dL — ABNORMAL LOW (ref 6.5–8.1)

## 2021-11-20 MED ORDER — RIVAROXABAN 20 MG PO TABS
20.0000 mg | ORAL_TABLET | Freq: Every day | ORAL | Status: DC
  Administered 2021-11-20 – 2021-11-22 (×3): 20 mg via ORAL
  Filled 2021-11-20 (×3): qty 1

## 2021-11-20 MED ORDER — SODIUM CHLORIDE 0.9 % IV SOLN: INTRAVENOUS | Status: AC

## 2021-11-20 NOTE — Progress Notes (Addendum)
[facility] PROGRESS NOTE    Progress Note  Patrick Bishop  DZH:[id number] DOB: 1941/05/17 DOA: 11/19/2021 PCP: Lujean Amel, MD     Brief Narrative:   Patrick Bishop is an 81 y.o. male past medical history significant for atrial fibrillation on Xarelto, chronic diastolic heart failure essential hypertension, urothelial carcinoma T2N0 who recently had a cystoscopy with a left ureteral stent exchange with retrograde pyelogram intraoperatively, he did receive chemotherapy and radiation therapy on 2022 who was seen by the urologist today prior to admission and told to come to the ED as he was hypotensive was found to be septic probably due to urinary tract infection    Assessment/Plan:   Sepsis ([place]) due to acute UTI in a patient with recent cystoscopy and left ureteral stent exchange on 11/05/2021:  He was fluid resuscitated started on IV Zosyn due to his history of multidrug-resistant Pseudomonas. Blood cultures were sent. Consult urology to see if he needs ureteral stent exchange. Discussed with urology they recommended follow-up with them in 2 weeks for stent exchange  Acute kidney injury: Baseline creatinine 1.4-1.6 on admission 2.0 after fluid resuscitation 1.6.  Chronic diastolic HF (heart failure) (HCC) Appears to be euvolemic on physical exam torsemide was held.  Essential hypertension: Continue metoprolol, continue to hold diuretic therapy.  Permanent atrial fibrillation (HCC); CHA2DS2-VASc score =5 (agex2, aortic plaque, CHF, HTN): Continue metoprolol and Xarelto Heart rate is well controlled.  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 Alen Blew as an outpatient.  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 Purple non-blancheable area surrounding stage 2, MASD to area also (Active)  08/09/21 1800  Location: Buttocks  Location Orientation: Left  Staging: Stage 2 -  Partial thickness loss of dermis presenting as a shallow open injury with a red, pink wound bed without slough.  Wound Description (Comments): Purple non-blancheable area surrounding stage 2, MASD to area also  Present on Admission: Yes     Pressure Injury 09/17/21 Sacrum Mid Stage 2 -  Partial thickness loss of dermis presenting as a shallow open injury with a red, pink wound bed without slough. pink (Active)  09/17/21 0256  Location: Sacrum  Location Orientation: Mid  Staging: Stage 2 -  Partial thickness loss of dermis presenting as a shallow open injury with a red, pink wound bed without slough.  Wound Description (Comments): pink  Present on Admission: Yes    Estimated body mass index is 31.67 kg/m as calculated from the following:   Height as of this encounter: 6\' 1"  (1.854 m).   Weight as of this encounter: 108.9 kg.     DVT prophylaxis: lovenox Family Communication:none Status is: Inpatient Remains inpatient appropriate because: Keep inpatient due to sepsis secondary to UTI     Code Status:     Code Status Orders  (From admission, onward)           Start     Ordered   11/19/21 1745  Do not attempt resuscitation (DNR)  Continuous       Question Answer Comment  In the event of cardiac or respiratory ARREST Do not call a code blue   In the event of cardiac or respiratory ARREST Do not perform Intubation, CPR, defibrillation or  ACLS   In the event of cardiac or respiratory ARREST Use medication by any route, position, wound care, and other measures to relive pain and suffering. May use oxygen, suction and manual treatment of airway obstruction as needed for comfort.      11/19/21 1744           Code Status History     Date Active Date Inactive Code Status Order ID  Comments User Context   09/17/2021 0147 09/21/2021 2000 Full Code [id number]  Toy Baker, MD Inpatient   08/09/2021 1738 08/13/2021 2102 Partial Code [id number]  Jonnie Finner, DO Inpatient   08/03/2019 0450 08/09/2019 1847 Full Code [id number]  Vianne Bulls, MD ED   06/24/2019 1653 07/02/2019 1743 Full Code [id number]  Karmen Bongo, MD ED         IV Access:   Peripheral IV   Procedures and diagnostic studies:   DG Chest Port 1 View  Result Date: 11/19/2021 CLINICAL DATA:  81 year old male with weakness. EXAM: PORTABLE CHEST - 1 VIEW COMPARISON:  09/16/2021 FINDINGS: The mediastinal contours are within normal limits. Unchanged cardiomegaly. Low lung volumes. Similar appearing chronic elevation the right hemidiaphragm. Mild bibasilar streaky opacities. No pleural effusion or pneumothorax. No acute osseous abnormality. IMPRESSION: Low lung volumes with bibasilar subsegmental atelectasis. Electronically Signed   By: Ruthann Cancer M.D.   On: 11/19/2021 12:41     Medical Consultants:   None.   Subjective:    Patrick Bishop relates he feels better than yesterday  Objective:    Vitals:   11/19/21 1630 11/19/21 1838 11/19/21 2250 11/20/21 0310  BP: (!) 101/53 111/74 101/68 108/67  Pulse: 65 67 61 72  Resp: (!) 25 18 18 18   Temp:  97.8 F (36.6 C) 97.6 F (36.4 C) 97.7 F (36.5 C)  TempSrc:  Oral Oral Oral  SpO2: 98% 97% 99% 97%  Weight:      Height:       SpO2: 97 %   Intake/Output Summary (Last 24 hours) at 11/20/2021 0747 Last data filed at 11/20/2021 0600 Gross per 24 hour  Intake 1318.52 ml  Output --  Net 1318.52 ml   Filed Weights   11/19/21 1615  Weight: 108.9 kg    Exam: General exam: In no acute distress. Respiratory system: Good air movement and clear to auscultation. Cardiovascular system: S1 & S2 heard, RRR. No JVD.  Gastrointestinal system: Abdomen is nondistended, soft and nontender.  Extremities: No pedal edema. Skin: Chronic  venous stasis changes with thick skin and hypertrophic nails Psychiatry: Judgement and insight appear normal. Mood & affect appropriate.    Data Reviewed:    Labs: Basic Metabolic Panel: Recent Labs  Lab 11/19/21 1248 11/20/21 0621  NA 140 138  K 4.0 4.1  CL 106 109  CO2 26 25  GLUCOSE 107* 99  BUN 25* 22  CREATININE 2.05* 1.68*  CALCIUM 8.4* 8.3*   GFR Estimated Creatinine Clearance: 44.6 mL/min (A) (by C-G formula based on SCr of 1.68 mg/dL (H)). Liver Function Tests: Recent Labs  Lab 11/19/21 1248 11/20/21 0621  AST 12* 12*  ALT 10 9  ALKPHOS 65 54  BILITOT 0.6 0.7  PROT 6.3* 5.7*  ALBUMIN 3.2* 2.9*   No results for input(s): LIPASE, AMYLASE in the last 168 hours. No results for input(s): AMMONIA in the last 168 hours. Coagulation profile No results for input(s): INR, PROTIME in the last 168 hours. COVID-19 Labs  No results for input(s): DDIMER, FERRITIN, LDH, CRP  in the last 72 hours.  Lab Results  Component Value Date   SARSCOV2NAA NEGATIVE 09/21/2021   SARSCOV2NAA NEGATIVE 09/16/2021   SARSCOV2NAA NEGATIVE 08/13/2021   [place] NEGATIVE 08/09/2021    CBC: Recent Labs  Lab 11/19/21 1248 11/20/21 0621  WBC 4.0 4.0  HGB 9.9* 10.0*  HCT 31.6* 31.3*  MCV 98.4 98.1  PLT 216 179   Cardiac Enzymes: No results for input(s): CKTOTAL, CKMB, CKMBINDEX, TROPONINI in the last 168 hours. BNP (last 3 results) No results for input(s): PROBNP in the last 8760 hours. CBG: No results for input(s): GLUCAP in the last 168 hours. D-Dimer: No results for input(s): DDIMER in the last 72 hours. Hgb A1c: No results for input(s): HGBA1C in the last 72 hours. Lipid Profile: No results for input(s): CHOL, HDL, LDLCALC, TRIG, CHOLHDL, LDLDIRECT in the last 72 hours. Thyroid function studies: No results for input(s): TSH, T4TOTAL, T3FREE, THYROIDAB in the last 72 hours.  Invalid input(s): FREET3 Anemia work up: No results for input(s): VITAMINB12, FOLATE,  FERRITIN, TIBC, IRON, RETICCTPCT in the last 72 hours. Sepsis Labs: Recent Labs  Lab 11/19/21 1248 11/19/21 1249 11/19/21 1550 11/20/21 0621  WBC 4.0  --   --  4.0  LATICACIDVEN  --  2.2* 1.1  --    Microbiology No results found for this or any previous visit (from the past 240 hour(s)).   Medications:    atorvastatin  10 mg Oral Daily   docusate sodium  100 mg Oral BID   metoprolol tartrate  25 mg Oral Daily   metoprolol tartrate  37.5 mg Oral QHS   pantoprazole  40 mg Oral BID   rivaroxaban  15 mg Oral Q supper   sodium chloride flush  3 mL Intravenous Q12H   tamsulosin  0.4 mg Oral Daily   Continuous Infusions:  sodium chloride     piperacillin-tazobactam (ZOSYN)  IV 3.375 g (11/20/21 0518)      LOS: 1 day   [name]  [facility]  11/20/2021, 7:47 AM

## 2021-11-21 DIAGNOSIS — I5032 Chronic diastolic (congestive) heart failure: Secondary | ICD-10-CM | POA: Diagnosis not present

## 2021-11-21 DIAGNOSIS — N179 Acute kidney failure, unspecified: Secondary | ICD-10-CM | POA: Diagnosis not present

## 2021-11-21 DIAGNOSIS — N390 Urinary tract infection, site not specified: Secondary | ICD-10-CM | POA: Diagnosis not present

## 2021-11-21 DIAGNOSIS — A419 Sepsis, unspecified organism: Secondary | ICD-10-CM | POA: Diagnosis not present

## 2021-11-21 MED ORDER — TORSEMIDE 20 MG PO TABS
20.0000 mg | ORAL_TABLET | Freq: Two times a day (BID) | ORAL | Status: DC
  Administered 2021-11-21 – 2021-11-23 (×4): 20 mg via ORAL
  Filled 2021-11-21 (×5): qty 1

## 2021-11-21 MED ORDER — POTASSIUM CHLORIDE CRYS ER 20 MEQ PO TBCR
40.0000 meq | EXTENDED_RELEASE_TABLET | Freq: Every day | ORAL | Status: DC
  Administered 2021-11-21 – 2021-11-23 (×3): 40 meq via ORAL
  Filled 2021-11-21 (×3): qty 2

## 2021-11-21 NOTE — Consult Note (Signed)
[facility] Nurse Consult Note: Reason for Consult: Right upper buttock partial thickness area of skin loss, not pressure Wound type: partial thickness Pressure Injury POA: N/A Measurement: 2cm x 2.2cm x 0.1cm  Wound YTR:ZNBV, moist Drainage (amount, consistency, odor) scant serous Periwound: intact, dry Dressing procedure/placement/frequency: I have provided Nursing with guidance to promote reepithelialization of area using an antimicrobial nonadherent (xeroform) gauze topped with a silicone foam. Patient is to turn from side to side and time in the supine position be minimized,. He reports spending the majority of his time at home in bed. He has mild LE edema and bilateral hemosiderin staining, but the LEs are intact. The heels are to be floated while in bed for PI prevention.  [place] nursing team will not follow, but will remain available to this patient, the nursing and medical teams.  Please re-consult if needed. Thanks, Maudie Flakes, MSN, RN, [place], Arther Abbott  Pager# [phone]

## 2021-11-21 NOTE — Progress Notes (Signed)
[facility] PROGRESS NOTE    Progress Note  Patrick Bishop  YQM:[id number] DOB: 1941/08/04 DOA: 11/19/2021 PCP: Lujean Amel, MD     Brief Narrative:   Patrick Bishop is an 81 y.o. male past medical history significant for atrial fibrillation on Xarelto, chronic diastolic heart failure essential hypertension, urothelial carcinoma T2N0 who recently had a cystoscopy with a left ureteral stent exchange with retrograde pyelogram intraoperatively, he did receive chemotherapy and radiation therapy on 2022 who was seen by the urologist today prior to admission and told to come to the ED as he was hypotensive was found to be septic probably due to urinary tract infection.  He will need transportation for home he is wheelchair-bound    Assessment/Plan:   Sepsis ([place]) due to acute UTI in a patient with recent cystoscopy and left ureteral stent exchange on 11/05/2021:  He was fluid resuscitated started on IV Zosyn due to his history of multidrug-resistant Pseudomonas. Tmax of 98.0. Urine culture grew more than 100,000 colonies of gram-negative rods continue IV Zosyn. He will need help with transportation at home as he is wheelchair-bound  Acute kidney injury: Baseline creatinine 1.4-1.6 on admission 2.0, now resolved after fluid resuscitation.  Chronic diastolic HF (heart failure) (HCC) Appears to be euvolemic on physical exam. Resume torsemide and potassium supplementation.  Essential hypertension: Continue metoprolol, resume torsemide and potassium supplementation.  Permanent atrial fibrillation (HCC); CHA2DS2-VASc score =5 (agex2, aortic plaque, CHF, HTN): Continue metoprolol and Xarelto Heart rate is well controlled.  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 Alen Blew as an outpatient.  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 Purple non-blancheable area surrounding stage 2, MASD to area also (Active)  08/09/21 1800  Location: Buttocks  Location Orientation: Left  Staging: Stage 2 -  Partial thickness loss of dermis presenting as a shallow open injury with a red, pink wound bed without slough.  Wound Description (Comments): Purple non-blancheable area surrounding stage 2, MASD to area also  Present on Admission: Yes     Pressure Injury 09/17/21 Sacrum Mid Stage 2 -  Partial thickness loss of dermis presenting as a shallow open injury with a red, pink wound bed without slough. pink (Active)  09/17/21 0256  Location: Sacrum  Location Orientation: Mid  Staging: Stage 2 -  Partial thickness loss of dermis presenting as a shallow open injury with a red, pink wound bed without slough.  Wound Description (Comments): pink  Present on Admission: Yes    Estimated body mass index is 31.67 kg/m as calculated from the following:   Height as of this encounter: 6\' 1"  (1.854 m).   Weight as of this encounter: 108.9 kg.     DVT prophylaxis: lovenox Family Communication:none Status is: Inpatient Remains inpatient appropriate because: Keep inpatient due to sepsis secondary to UTI     Code Status:     Code Status Orders  (From admission, onward)           Start     Ordered   11/19/21 1745  Do not attempt resuscitation (DNR)  Continuous       Question Answer Comment  In the event of cardiac or respiratory ARREST Do not call a code blue   In the  event of cardiac or respiratory ARREST Do not perform Intubation, CPR, defibrillation or ACLS   In the event of cardiac or respiratory ARREST Use medication by any route, position, wound care, and other measures to relive pain and suffering. May use oxygen, suction and manual treatment of airway obstruction as needed for  comfort.      11/19/21 1744           Code Status History     Date Active Date Inactive Code Status Order ID Comments User Context   09/17/2021 0147 09/21/2021 2000 Full Code [id number]  Toy Baker, MD Inpatient   08/09/2021 1738 08/13/2021 2102 Partial Code [id number]  Jonnie Finner, DO Inpatient   08/03/2019 0450 08/09/2019 1847 Full Code [id number]  Vianne Bulls, MD ED   06/24/2019 1653 07/02/2019 1743 Full Code [id number]  Karmen Bongo, MD ED         IV Access:   Peripheral IV   Procedures and diagnostic studies:   DG Chest Port 1 View  Result Date: 11/19/2021 CLINICAL DATA:  81 year old male with weakness. EXAM: PORTABLE CHEST - 1 VIEW COMPARISON:  09/16/2021 FINDINGS: The mediastinal contours are within normal limits. Unchanged cardiomegaly. Low lung volumes. Similar appearing chronic elevation the right hemidiaphragm. Mild bibasilar streaky opacities. No pleural effusion or pneumothorax. No acute osseous abnormality. IMPRESSION: Low lung volumes with bibasilar subsegmental atelectasis. Electronically Signed   By: Ruthann Cancer M.D.   On: 11/19/2021 12:41     Medical Consultants:   None.   Subjective:    [name] relates he continues to feel good, appetite has returned.  Objective:    Vitals:   11/20/21 0838 11/20/21 1349 11/20/21 2053 11/21/21 0609  BP: (!) 105/59 (!) 95/55 (!) 103/57 108/65  Pulse: 64 60 63 68  Resp: 17 18 17 16   Temp: 97.8 F (36.6 C) 98.1 F (36.7 C) 98 F (36.7 C) 97.8 F (36.6 C)  TempSrc: Oral Oral Oral Oral  SpO2:   98% 97%  Weight:      Height:       SpO2: 97 %   Intake/Output Summary (Last 24 hours) at 11/21/2021 0933 Last data filed at 11/21/2021 0908 Gross per 24 hour  Intake 395.21 ml  Output 500 ml  Net -104.79 ml    Filed Weights   11/19/21 1615  Weight: 108.9 kg    Exam: General exam: In no acute distress. Respiratory system: Good air movement and clear to auscultation. Cardiovascular  system: S1 & S2 heard, RRR. No JVD. Gastrointestinal system: Abdomen is nondistended, soft and nontender.  Psychiatry: Judgement and insight appear normal. Mood & affect appropriate.   Data Reviewed:    Labs: Basic Metabolic Panel: Recent Labs  Lab 11/19/21 1248 11/20/21 0621  NA 140 138  K 4.0 4.1  CL 106 109  CO2 26 25  GLUCOSE 107* 99  BUN 25* 22  CREATININE 2.05* 1.68*  CALCIUM 8.4* 8.3*    GFR Estimated Creatinine Clearance: 44.6 mL/min (A) (by C-G formula based on SCr of 1.68 mg/dL (H)). Liver Function Tests: Recent Labs  Lab 11/19/21 1248 11/20/21 0621  AST 12* 12*  ALT 10 9  ALKPHOS 65 54  BILITOT 0.6 0.7  PROT 6.3* 5.7*  ALBUMIN 3.2* 2.9*    No results for input(s): LIPASE, AMYLASE in the last 168 hours. No results for input(s): AMMONIA in the last 168 hours. Coagulation profile No results for input(s): INR, PROTIME in the last 168 hours. COVID-19 Labs  No  results for input(s): DDIMER, FERRITIN, LDH, CRP in the last 72 hours.  Lab Results  Component Value Date   SARSCOV2NAA NEGATIVE 09/21/2021   SARSCOV2NAA NEGATIVE 09/16/2021   SARSCOV2NAA NEGATIVE 08/13/2021   [place] NEGATIVE 08/09/2021    CBC: Recent Labs  Lab 11/19/21 1248 11/20/21 0621  WBC 4.0 4.0  HGB 9.9* 10.0*  HCT 31.6* 31.3*  MCV 98.4 98.1  PLT 216 179    Cardiac Enzymes: No results for input(s): CKTOTAL, CKMB, CKMBINDEX, TROPONINI in the last 168 hours. BNP (last 3 results) No results for input(s): PROBNP in the last 8760 hours. CBG: No results for input(s): GLUCAP in the last 168 hours. D-Dimer: No results for input(s): DDIMER in the last 72 hours. Hgb A1c: No results for input(s): HGBA1C in the last 72 hours. Lipid Profile: No results for input(s): CHOL, HDL, LDLCALC, TRIG, CHOLHDL, LDLDIRECT in the last 72 hours. Thyroid function studies: No results for input(s): TSH, T4TOTAL, T3FREE, THYROIDAB in the last 72 hours.  Invalid input(s): FREET3 Anemia work  up: No results for input(s): VITAMINB12, FOLATE, FERRITIN, TIBC, IRON, RETICCTPCT in the last 72 hours. Sepsis Labs: Recent Labs  Lab 11/19/21 1248 11/19/21 1249 11/19/21 1550 11/20/21 0621  WBC 4.0  --   --  4.0  LATICACIDVEN  --  2.2* 1.1  --     Microbiology Recent Results (from the past 240 hour(s))  Blood culture (routine x 2)     Status: None (Preliminary result)   Collection Time: 11/19/21 12:00 PM   Specimen: BLOOD  Result Value Ref Range Status   Specimen Description   Final    BLOOD RIGHT ANTECUBITAL Performed at [facility], [place] [address] [place], [place] [address]    Special Requests   Final    BOTTLES DRAWN AEROBIC AND ANAEROBIC Blood Culture adequate volume Performed at [facility] [address] [place], [place] [address]    Culture   Final    NO GROWTH 2 DAYS Performed at [facility] [address] [place], [place] [address]    Report Status PENDING  Incomplete  Blood culture (routine x 2)     Status: None (Preliminary result)   Collection Time: 11/19/21 12:55 PM   Specimen: BLOOD  Result Value Ref Range Status   Specimen Description   Final    BLOOD LEFT ANTECUBITAL Performed at [facility] [address] [place], [place] [address]    Special Requests   Final    BOTTLES DRAWN AEROBIC AND ANAEROBIC Blood Culture adequate volume Performed at [facility] [address], [place], [place] [address]    Culture   Final    NO GROWTH 2 DAYS Performed at [facility] [address], [place], [place] [address]    Report Status PENDING  Incomplete  Urine Culture     Status: Abnormal (Preliminary result)   Collection Time: 11/19/21  3:49 PM   Specimen: Urine, Clean Catch  Result Value Ref Range Status   Specimen Description   Final    URINE, CLEAN CATCH Performed at [facility], [place] [address] [place], [place] [address]    Special  Requests   Final    NONE Performed at [facility], [place] [address], [place], [place] [address]    Culture (A)  Final    >=100,000 COLONIES/mL GRAM NEGATIVE RODS SUSCEPTIBILITIES TO FOLLOW Performed at [facility], [place],  [place] [address]    Report Status PENDING  Incomplete     Medications:    atorvastatin  10 mg Oral Daily   docusate sodium  100 mg Oral BID   metoprolol tartrate  25 mg Oral Daily   metoprolol tartrate  37.5 mg Oral QHS   pantoprazole  40 mg Oral BID   rivaroxaban  20 mg Oral Q supper   sodium chloride flush  3 mL Intravenous Q12H   tamsulosin  0.4 mg Oral Daily   Continuous Infusions:  sodium chloride     piperacillin-tazobactam (ZOSYN)  IV 3.375 g (11/21/21 0542)      LOS: 2 days   [name]  [facility]  11/21/2021, 9:33 AM

## 2021-11-22 ENCOUNTER — Inpatient Hospital Stay: Payer: Self-pay

## 2021-11-22 ENCOUNTER — Inpatient Hospital Stay (HOSPITAL_COMMUNITY): Payer: Medicare Other

## 2021-11-22 DIAGNOSIS — R6520 Severe sepsis without septic shock: Secondary | ICD-10-CM

## 2021-11-22 DIAGNOSIS — N179 Acute kidney failure, unspecified: Secondary | ICD-10-CM

## 2021-11-22 DIAGNOSIS — I5032 Chronic diastolic (congestive) heart failure: Secondary | ICD-10-CM | POA: Diagnosis not present

## 2021-11-22 DIAGNOSIS — A419 Sepsis, unspecified organism: Secondary | ICD-10-CM

## 2021-11-22 LAB — URINE CULTURE: Culture: >=100000 — AB

## 2021-11-22 MED ORDER — SODIUM CHLORIDE 0.9% FLUSH
10.0000 mL | Freq: Two times a day (BID) | INTRAVENOUS | Status: DC
  Administered 2021-11-22 – 2021-11-23 (×2): 10 mL

## 2021-11-22 MED ORDER — ERTAPENEM IV (FOR PTA / DISCHARGE USE ONLY): 1.0000 g | INTRAVENOUS | 0 refills | Status: AC

## 2021-11-22 MED ORDER — CHLORHEXIDINE GLUCONATE CLOTH 2 % EX PADS
6.0000 | MEDICATED_PAD | Freq: Every day | CUTANEOUS | Status: DC
  Administered 2021-11-22 – 2021-11-23 (×2): 6 via TOPICAL

## 2021-11-22 MED ORDER — SODIUM CHLORIDE 0.9% FLUSH: 10.0000 mL | INTRAVENOUS | Status: DC | PRN

## 2021-11-22 MED ORDER — SODIUM CHLORIDE 0.9 % IV SOLN
1.0000 g | Freq: Every day | INTRAVENOUS | Status: DC
  Administered 2021-11-22 – 2021-11-23 (×2): 1000 mg via INTRAVENOUS
  Filled 2021-11-22 (×2): qty 1

## 2021-11-22 NOTE — Progress Notes (Signed)
Peripherally Inserted Central Catheter Placement  The IV Nurse has discussed with the patient and/or persons authorized to consent for the patient, the purpose of this procedure and the potential benefits and risks involved with this procedure.  The benefits include less needle sticks, lab draws from the catheter, and the patient may be discharged home with the catheter. Risks include, but not limited to, infection, bleeding, blood clot (thrombus formation), and puncture of an artery; nerve damage and irregular heartbeat and possibility to perform a PICC exchange if needed/ordered by physician.  Alternatives to this procedure were also discussed.  B[facility] Power PICC patient education guide, fact sheet on infection prevention and patient information card has been provided to patient /or left at bedside.    PICC Placement Documentation  PICC Single Lumen 56/86/16 Right Basilic 41 cm 0 cm (Active)  Indication for Insertion or Continuance of Line Home intravenous therapies (PICC only) 11/22/21 1559  Exposed Catheter (cm) 0 cm 11/22/21 1559  Site Assessment Clean, Dry, Intact 11/22/21 1559  Line Status Flushed;Blood return noted;Saline locked 11/22/21 1559  Dressing Type Transparent 11/22/21 1559  Dressing Status Antimicrobial disc in place 11/22/21 1559  Dressing Change Due 11/29/21 11/22/21 1559       Patrick Bishop 11/22/2021, 4:01 PM

## 2021-11-22 NOTE — Progress Notes (Signed)
[facility] PROGRESS NOTE    Progress Note  Patrick Bishop  MOQ:[id number] DOB: May 01, 1941 DOA: 11/19/2021 PCP: Lujean Amel, MD     Brief Narrative:   Patrick Bishop is an 81 y.o. male past medical history significant for atrial fibrillation on Xarelto, chronic diastolic heart failure essential hypertension, urothelial carcinoma T2N0 who recently had a cystoscopy with a left ureteral stent exchange with retrograde pyelogram intraoperatively, he did receive chemotherapy and radiation therapy on 2022 who was seen by the urologist today prior to admission and told to come to the ED as he was hypotensive was found to be septic probably due to urinary tract infection.  He will need transportation for home he is wheelchair-bound    Assessment/Plan:   Sepsis ([place]) due to acute UTI in a patient with recent cystoscopy and left ureteral stent exchange on 11/05/2021:  He was fluid resuscitated started on IV Zosyn due to his history of multidrug-resistant Pseudomonas. Tmax of 98.0. Urine culture grew more than 100,000 colonies of ESBL Pseudomonas, changed to IV Invanz discussed with ID he will have to go home on 2 weeks of Invanz. He will need help with transportation at home as he is wheelchair-bound  Acute kidney injury: Baseline creatinine 1.4-1.6 on admission 2.0, now resolved after fluid resuscitation.  Chronic diastolic HF (heart failure) (HCC) Appears to be euvolemic on physical exam. Resume torsemide and potassium supplementation.  Essential hypertension: Continue metoprolol, resume torsemide and potassium supplementation.  Permanent atrial fibrillation (HCC); CHA2DS2-VASc score =5 (agex2, aortic plaque, CHF, HTN): Continue metoprolol and Xarelto Heart rate is well controlled.  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 Alen Blew as an outpatient.  Stage II sacral decubitus ulcer of the left buttock present on  admission RN Pressure Injury Documentation: Pressure Injury 08/09/21 Buttocks Left Stage 2 -  Partial thickness loss of dermis presenting as a shallow open injury with a red, pink wound bed without slough. Purple non-blancheable area surrounding stage 2, MASD to area also (Active)  08/09/21 1800  Location: Buttocks  Location Orientation: Left  Staging: Stage 2 -  Partial thickness loss of dermis presenting as a shallow open injury with a red, pink wound bed without slough.  Wound Description (Comments): Purple non-blancheable area surrounding stage 2, MASD to area also  Present on Admission: Yes     Pressure Injury 09/17/21 Sacrum Mid Stage 2 -  Partial thickness loss of dermis presenting as a shallow open injury with a red, pink wound bed without slough. pink (Active)  09/17/21 0256  Location: Sacrum  Location Orientation: Mid  Staging: Stage 2 -  Partial thickness loss of dermis presenting as a shallow open injury with a red, pink wound bed without slough.  Wound Description (Comments): pink  Present on Admission: Yes    Estimated body mass index is 31.67 kg/m as calculated from the following:   Height as of this encounter: 6\' 1"  (1.854 m).   Weight as of this encounter: 108.9 kg.     DVT prophylaxis: lovenox Family Communication:none Status is: Inpatient Remains inpatient appropriate because: Keep inpatient due to sepsis secondary to UTI     Code Status:     Code Status Orders  (From admission, onward)           Start     Ordered   11/19/21 1745  Do not attempt resuscitation (DNR)  Continuous       Question Answer Comment  In the event  of cardiac or respiratory ARREST Do not call a code blue   In the event of cardiac or respiratory ARREST Do not perform Intubation, CPR, defibrillation or ACLS   In the event of cardiac or respiratory ARREST Use medication by any route, position, wound care, and other measures to relive pain and suffering. May use oxygen, suction  and manual treatment of airway obstruction as needed for comfort.      11/19/21 1744           Code Status History     Date Active Date Inactive Code Status Order ID Comments User Context   09/17/2021 0147 09/21/2021 2000 Full Code [id number]  Toy Baker, MD Inpatient   08/09/2021 1738 08/13/2021 2102 Partial Code [id number]  Jonnie Finner, DO Inpatient   08/03/2019 0450 08/09/2019 1847 Full Code [id number]  Vianne Bulls, MD ED   06/24/2019 1653 07/02/2019 1743 Full Code [id number]  Karmen Bongo, MD ED         IV Access:   Peripheral IV   Procedures and diagnostic studies:   No results found.   Medical Consultants:   None.   Subjective:    Patrick Bishop no complaints  Objective:    Vitals:   11/20/21 2053 11/21/21 0609 11/21/21 1505 11/21/21 2006  BP: (!) 103/57 108/65 105/60 (!) 96/58  Pulse: 63 68 62 (!) 57  Resp: 17 16 16 16   Temp: 98 F (36.7 C) 97.8 F (36.6 C) 98 F (36.7 C) 97.6 F (36.4 C)  TempSrc: Oral Oral Oral Oral  SpO2: 98% 97% 98% 97%  Weight:      Height:       SpO2: 97 %   Intake/Output Summary (Last 24 hours) at 11/22/2021 0915 Last data filed at 11/22/2021 0615 Gross per 24 hour  Intake 432.98 ml  Output 300 ml  Net 132.98 ml    Filed Weights   11/19/21 1615  Weight: 108.9 kg    Exam: General exam: In no acute distress. Respiratory system: Good air movement and clear to auscultation. Cardiovascular system: S1 & S2 heard, RRR. No JVD. Gastrointestinal system: Abdomen is nondistended, soft and nontender.  Extremities: No pedal edema. Skin: No rashes, lesions or ulcers Psychiatry: Judgement and insight appear normal. Mood & affect appropriate.   Data Reviewed:    Labs: Basic Metabolic Panel: Recent Labs  Lab 11/19/21 1248 11/20/21 0621  NA 140 138  K 4.0 4.1  CL 106 109  CO2 26 25  GLUCOSE 107* 99  BUN 25* 22  CREATININE 2.05* 1.68*  CALCIUM 8.4* 8.3*    GFR Estimated Creatinine  Clearance: 44.6 mL/min (A) (by C-G formula based on SCr of 1.68 mg/dL (H)). Liver Function Tests: Recent Labs  Lab 11/19/21 1248 11/20/21 0621  AST 12* 12*  ALT 10 9  ALKPHOS 65 54  BILITOT 0.6 0.7  PROT 6.3* 5.7*  ALBUMIN 3.2* 2.9*    No results for input(s): LIPASE, AMYLASE in the last 168 hours. No results for input(s): AMMONIA in the last 168 hours. Coagulation profile No results for input(s): INR, PROTIME in the last 168 hours. COVID-19 Labs  No results for input(s): DDIMER, FERRITIN, LDH, CRP in the last 72 hours.  Lab Results  Component Value Date   SARSCOV2NAA NEGATIVE 09/21/2021   SARSCOV2NAA NEGATIVE 09/16/2021   SARSCOV2NAA NEGATIVE 08/13/2021   [place] NEGATIVE 08/09/2021    CBC: Recent Labs  Lab 11/19/21 1248 11/20/21 0621  WBC 4.0 4.0  HGB 9.9* 10.0*  HCT  31.6* 31.3*  MCV 98.4 98.1  PLT 216 179    Cardiac Enzymes: No results for input(s): CKTOTAL, CKMB, CKMBINDEX, TROPONINI in the last 168 hours. BNP (last 3 results) No results for input(s): PROBNP in the last 8760 hours. CBG: No results for input(s): GLUCAP in the last 168 hours. D-Dimer: No results for input(s): DDIMER in the last 72 hours. Hgb A1c: No results for input(s): HGBA1C in the last 72 hours. Lipid Profile: No results for input(s): CHOL, HDL, LDLCALC, TRIG, CHOLHDL, LDLDIRECT in the last 72 hours. Thyroid function studies: No results for input(s): TSH, T4TOTAL, T3FREE, THYROIDAB in the last 72 hours.  Invalid input(s): FREET3 Anemia work up: No results for input(s): VITAMINB12, FOLATE, FERRITIN, TIBC, IRON, RETICCTPCT in the last 72 hours. Sepsis Labs: Recent Labs  Lab 11/19/21 1248 11/19/21 1249 11/19/21 1550 11/20/21 0621  WBC 4.0  --   --  4.0  LATICACIDVEN  --  2.2* 1.1  --     Microbiology Recent Results (from the past 240 hour(s))  Blood culture (routine x 2)     Status: None (Preliminary result)   Collection Time: 11/19/21 12:00 PM   Specimen: BLOOD   Result Value Ref Range Status   Specimen Description   Final    BLOOD RIGHT ANTECUBITAL Performed at [facility], [place] [address] [place], [place] [address]    Special Requests   Final    BOTTLES DRAWN AEROBIC AND ANAEROBIC Blood Culture adequate volume Performed at [place] [address] [place], [place] [address]    Culture   Final    NO GROWTH 3 DAYS Performed at [facility], [place] [address] [place], [place] [address]    Report Status PENDING  Incomplete  Blood culture (routine x 2)     Status: None (Preliminary result)   Collection Time: 11/19/21 12:55 PM   Specimen: BLOOD  Result Value Ref Range Status   Specimen Description   Final    BLOOD LEFT ANTECUBITAL Performed at [place] [address] [place], [place] [address]    Special Requests   Final    BOTTLES DRAWN AEROBIC AND ANAEROBIC Blood Culture adequate volume Performed at [place] [address], [place], [place] [address]    Culture   Final    NO GROWTH 3 DAYS Performed at [facility], [place] [address] [place], [place] [address]    Report Status PENDING  Incomplete  Urine Culture     Status: Abnormal   Collection Time: 11/19/21  3:49 PM   Specimen: Urine, Clean Catch  Result Value Ref Range Status   Specimen Description   Final    URINE, CLEAN CATCH Performed at [facility], [place] [address] [place], [place] [address]    Special Requests   Final    NONE Performed at [facility], [place] [address] [place],  [address]    Culture (A)  Final    >=100,000 COLONIES/mL ESCHERICHIA COLI Confirmed Extended Spectrum Beta-Lactamase Producer (ESBL).  In bloodstream infections from ESBL organisms, carbapenems are preferred over piperacillin/tazobactam. They are shown to have a lower risk of mortality.    Report Status 11/22/2021 FINAL  Final   Organism ID, Bacteria  ESCHERICHIA COLI (A)  Final      Susceptibility   Escherichia coli - MIC*    AMPICILLIN >=32 RESISTANT Resistant     CEFAZOLIN >=64 RESISTANT Resistant  CEFEPIME 16 RESISTANT Resistant     CEFTRIAXONE >=64 RESISTANT Resistant     CIPROFLOXACIN >=4 RESISTANT Resistant     GENTAMICIN >=16 RESISTANT Resistant     IMIPENEM <=0.25 SENSITIVE Sensitive     NITROFURANTOIN <=16 SENSITIVE Sensitive     TRIMETH/SULFA >=320 RESISTANT Resistant     AMPICILLIN/SULBACTAM >=32 RESISTANT Resistant     PIP/TAZO 64 INTERMEDIATE Intermediate     * >=100,000 COLONIES/mL ESCHERICHIA COLI     Medications:    atorvastatin  10 mg Oral Daily   docusate sodium  100 mg Oral BID   metoprolol tartrate  25 mg Oral Daily   metoprolol tartrate  37.5 mg Oral QHS   pantoprazole  40 mg Oral BID   potassium chloride SA  40 mEq Oral Daily   rivaroxaban  20 mg Oral Q supper   sodium chloride flush  3 mL Intravenous Q12H   tamsulosin  0.4 mg Oral Daily   torsemide  20 mg Oral BID   Continuous Infusions:  sodium chloride     piperacillin-tazobactam (ZOSYN)  IV 3.375 g (11/22/21 0615)      LOS: 3 days   [name]  [facility]  11/22/2021, 9:15 AM

## 2021-11-22 NOTE — Consult Note (Addendum)
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  Recommendations:  -Ertapenem x 14 days EOT 12/05/21 -Place PICC -Follow-up with Urology. Recommend removing stent as it is likely a nidus for infection   OPAT ORDERS:  Diagnosis: Complicated UTI  Culture Result: ESBL Ecoli  Allergies  Allergen Reactions   Aldactone [Spironolactone] Other (See Comments)    Unknown reaction      Discharge antibiotics to be given via PICC line:  Per pharmacy protocol Ertapenem 1gm q24h  Duration: 14 days End Date: 12/05/21  [facility] Care Per Protocol with Biopatch Use: Home health RN for IV administration and teaching, line care and labs.    Labs weekly while on IV antibiotics: __ CBC with differential __ CMP __ Please pull PIC at completion of IV antibiotics   Fax weekly labs to [phone]  Clinic Follow Up Appt: ID at [place] on 3/10 and 10 AM Microbiology:   Antibiotics: Pip-tazo 2/24-2/26 Metronidazole 2/24 Vancomycin 2/24   Cultures: Blood 2/24 NG Urine 2/24 Ecoli ESBL Escherichia coli      MIC    AMPICILLIN >=32 RESIST... Resistant    AMPICILLIN/SULBACTAM >=32 RESIST... Resistant    CEFAZOLIN >=64 RESIST... Resistant    CEFEPIME 16 RESISTANT  Resistant    CEFTRIAXONE >=64 RESIST... Resistant     CIPROFLOXACIN >=4 RESISTANT  Resistant    GENTAMICIN >=16 RESIST... Resistant    IMIPENEM <=0.25 SENS... Sensitive    NITROFURANTOIN <=16 SENSIT... Sensitive    PIP/TAZO 64 INTERMED... Intermediate    TRIMETH/SULFA >=320 RESIS... Resistant            HPI: Patrick Bishop is a 81 y.o. male Hx of kidney stone, CHF, Afib on lovenox, PVD, LE lymphedema, invasive bladder cancer with direct  involvement of the left ureteral orfice SP chemo/radiation in 2022 SP TURBT and left JJ stent on 6/44/03 complicated by MDR pseudomonal UTI on 11/01/21 SP left ureteral stent exchange on 11/05/21(received gentamycin peri-op). He was admitted for sepsis 2/2 UTI.  He was found to be hypotensive after been seen by Ruology day prior to admission. He was told to go to the ED, but went home. He came to ED with decreased appetite and fatigue. Found ot have SBP in 90s. He received IVF and and started on broad spectrum antibiotics.  ID engaged as urine Cx+ ESBl Ecoli.    Review of Systems: Review of Systems  All other systems reviewed and are negative.  Past Medical History:  Diagnosis Date   AAA (abdominal aortic aneurysm)    3.5cm by 04/06/21 CT   Arthritis    Bowel and bladder incontinence    wears depends   Cancer of trigone of urinary bladder ([place]) 11/05/2020   Chronic diastolic heart failure (Patrick Bishop)  Normal LV Fxn by Echo 04/2017    Chronic venous insufficiency 2015   s/p Bilateral Iliac & Femoral Stents (including IVC) - DEEP Venous Reflux.  Also s/p Bilateral GSV Ablation.   Dizziness    Dyspnea    Essential hypertension    History of kidney stones    Hx of adenomatous colonic polyps    Hyperlipidemia with target LDL less than 100    Longstanding persistent atrial fibrillation (HCC)    Nephrolithiasis 05/2019   CKD stage 3   Obesity    OSA on CPAP    does not cpap    Pneumonia 08/2019   Pre-diabetes    Pulmonary hypertension (Patrick Bishop)    Related to OSA, partial component of HFpEF   Pyelonephritis  05/2019   Tumor    Left arm    Social History   Tobacco Use   Smoking status: Former    Packs/day: 1.00    Years: 35.00    Pack years: 35.00    Types: Cigarettes    Quit date: 1996    Years since quitting: 27.1   Smokeless tobacco: Never   Tobacco comments:    smoked from age [age]   Vaping Use   Vaping Use: Never used  Substance Use Topics   Alcohol use: Yes    Comment: occasional   Drug use: No    Family History  Problem Relation Age of Onset   Colon cancer Mother    Heart attack Father 42   Hypertension Brother    Cancer Sister    Colon cancer Sister    Other Neg Hx        He really does not know much about his parents and siblings health, but they did not speak about healthcare   Scheduled Meds:  atorvastatin  10 mg Oral Daily   docusate sodium  100 mg Oral BID   metoprolol tartrate  25 mg Oral Daily   metoprolol tartrate  37.5 mg Oral QHS   pantoprazole  40 mg Oral BID   potassium chloride SA  40 mEq Oral Daily   rivaroxaban  20 mg Oral Q supper   sodium chloride flush  3 mL Intravenous Q12H   tamsulosin  0.4 mg Oral Daily   torsemide  20 mg Oral BID   Continuous Infusions:  sodium chloride     ertapenem 1,000 mg (11/22/21 1035)   PRN Meds:.sodium chloride, HYDROcodone-acetaminophen, ondansetron **OR** ondansetron (ZOFRAN) IV, polyethylene glycol, sodium chloride flush Allergies  Allergen Reactions   Aldactone [Spironolactone] Other (See Comments)    Unknown reaction     OBJECTIVE: Blood pressure (!) 96/58, pulse (!) 57, temperature 97.6 F (36.4 C), temperature source Oral, resp. rate 16, height 6\' 1"  (1.854 m), weight 108.9 kg, SpO2 97 %.  Physical Exam Constitutional:      General: He is not in acute distress.    Appearance: He is normal weight. He is not toxic-appearing.  HENT:     Head: Normocephalic and atraumatic.     Right Ear: External ear normal.     Left Ear: External ear normal.     Nose: No congestion or rhinorrhea.      Mouth/Throat:     Mouth: Mucous membranes are moist.     Pharynx: Oropharynx is clear.  Eyes:     Extraocular Movements: Extraocular movements intact.     Conjunctiva/sclera: Conjunctivae normal.     Pupils: Pupils are equal, round, and reactive to light.  Cardiovascular:  Rate and Rhythm: Normal rate and regular rhythm.     Heart sounds: No murmur heard.   No friction rub. No gallop.  Pulmonary:     Effort: Pulmonary effort is normal.     Breath sounds: Normal breath sounds.  Abdominal:     General: Abdomen is flat. Bowel sounds are normal.     Palpations: Abdomen is soft.  Musculoskeletal:        General: No swelling. Normal range of motion.     Cervical back: Normal range of motion and neck supple.  Skin:    General: Skin is warm and dry.     Comments: B/l LE below knees ruddy appearing   Neurological:     General: No focal deficit present.     Mental Status: He is oriented to person, place, and time.  Psychiatric:        Mood and Affect: Mood normal.    Lab Results Lab Results  Component Value Date   WBC 4.0 11/20/2021   HGB 10.0 (L) 11/20/2021   HCT 31.3 (L) 11/20/2021   MCV 98.1 11/20/2021   PLT 179 11/20/2021    Lab Results  Component Value Date   CREATININE 1.68 (H) 11/20/2021   BUN 22 11/20/2021   NA 138 11/20/2021   K 4.1 11/20/2021   CL 109 11/20/2021   CO2 25 11/20/2021    Lab Results  Component Value Date   ALT 9 11/20/2021   AST 12 (L) 11/20/2021   ALKPHOS 54 11/20/2021   BILITOT 0.7 11/20/2021       Laurice Record, [place] for Infectious Disease [facility] 11/22/2021, 11:09 AM

## 2021-11-22 NOTE — TOC Progression Note (Signed)
Transition of Care [facility]) - Progression Note    Patient Details  Name: Patrick Bishop MRN: [id number] Date of Birth: [birth year]  Transition of [facility]) CM/SW Contact  Purcell Mouton, RN Phone Number: 11/22/2021, 12:52 PM  Clinical Narrative:     [name] with pt and [name] (Via phone). Both agreed with Alvis Lemmings for [facility] and for [facility]. Referral given to in house reps.    Expected Discharge Plan: [place] Barriers to Discharge: No Barriers Identified  Expected Discharge Plan and Services Expected Discharge Plan: [place] arrangements for the past 2 months: Apartment                           HH Arranged: RN HH Agency: [place] Date [place]: 11/22/21 Time [place]: 1975 Representative spoke with at [place]: [place] ([place]) Interventions    Readmission Risk Interventions No flowsheet data found.

## 2021-11-22 NOTE — Progress Notes (Signed)
PHARMACY CONSULT NOTE FOR:  OUTPATIENT  PARENTERAL ANTIBIOTIC THERAPY (OPAT)  Indication: ESBL E Coli UTI w/stents Regimen: Ertapenem 1 gm every 24 hours End date: 12/06/21  IV antibiotic discharge orders are pended. To discharging provider:  please sign these orders via discharge navigator,  Select New Orders & click on the button choice - Manage This Unsigned Work.     Thank you for allowing pharmacy to be a part of this patient's care.  Jimmy Footman, PharmD, BCPS, BCIDP Infectious Diseases Clinical Pharmacist Phone: [phone] 11/22/2021, 9:33 AM

## 2021-11-23 DIAGNOSIS — I5032 Chronic diastolic (congestive) heart failure: Secondary | ICD-10-CM | POA: Diagnosis not present

## 2021-11-23 DIAGNOSIS — C670 Malignant neoplasm of trigone of bladder: Secondary | ICD-10-CM

## 2021-11-23 DIAGNOSIS — A419 Sepsis, unspecified organism: Secondary | ICD-10-CM | POA: Diagnosis not present

## 2021-11-23 DIAGNOSIS — N179 Acute kidney failure, unspecified: Secondary | ICD-10-CM | POA: Diagnosis not present

## 2021-11-23 NOTE — Progress Notes (Signed)
AVS reviewed with patient. All questions answered. AVS placed in discharge packet to go with [facility] when transporting pt home. [name] informed AVS packet in discharge packet.  Awaiting [facility].

## 2021-11-23 NOTE — Discharge Summary (Addendum)
Physician Discharge Summary  Patrick Bishop SHU:[id number] DOB: December 31, 1940 DOA: 11/19/2021  PCP: Lujean Amel, MD  Admit date: 11/19/2021 Discharge date: 11/23/2021  Admitted From: Home Disposition:  Home  Recommendations for Outpatient Follow-up:  Follow up with Urology in 1-2 weeks, for possible stent removal Please obtain BMP/CBC in one week   Home Health:Yes Equipment/Devices:none  Discharge Condition:Stable CODE STATUS:Full Diet recommendation: Heart Healthy  Brief/Interim Summary: 81 y.o. male past medical history significant for atrial fibrillation on Xarelto, chronic diastolic heart failure essential hypertension, urothelial carcinoma T2N0 who recently had a cystoscopy with a left ureteral stent exchange with retrograde pyelogram intraoperatively, he did receive chemotherapy and radiation therapy on 2022 who was seen by the urologist today prior to admission and told to come to the ED as he was hypotensive was found to be septic probably due to urinary tract infection.  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 He will follow-up with urology as an outpatient. PICC line was placed on 11/23/2021.  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  Chronic diastolic heart failure: Appears euvolemic on physical exam no changes made to his medication.  Essential hypertension: Well-controlled no changes made continue current regimen.  Permanent atrial fibrillation (HCC); CHA2DS2-VASc score =5 (agex2, aortic plaque, CHF, HTN): Continue metoprolol and Xarelto Heart rate is well controlled  Chronic venous insufficiency: Noted.  History of trigone of the urinary bladder: Follow-up with Dr. Alen Blew as an outpatient.  Morbid obesity: Counseling.  Discharge Instructions  Discharge Instructions     [facility] pharmacist to adjust dose for Vancomycin, Aminoglycosides and other anti-infective therapies as requested by physician.   Complete by: As directed    [facility] to provide Cath Flo 2mg    Complete by: As directed    Administer for PICC line occlusion and as ordered by physician for other access device issues.   Anaphylaxis Kit: Provided to treat any anaphylactic reaction to the medication being provided to the patient if First Dose or when requested by physician   Complete by: As directed    Epinephrine 1mg /ml vial / amp: Administer 0.3mg  (0.26ml) subcutaneously once for moderate to severe anaphylaxis, nurse to call physician and pharmacy when reaction occurs and call 911 if needed for immediate care   Diphenhydramine 50mg /ml IV vial: Administer 25-50mg  IV/IM PRN for first dose reaction, rash, itching, mild reaction, nurse to call physician and pharmacy when reaction occurs   Sodium Chloride 0.9% NS 543ml IV: Administer if needed for hypovolemic blood pressure drop or as ordered by physician after call to physician with anaphylactic reaction   Change dressing on IV access line weekly and PRN   Complete by: As directed    Diet - low sodium heart healthy   Complete by: As  directed    Discharge wound care:   Complete by: As directed    Per wound care nurse   Flush IV access with Sodium Chloride  0.9% and Heparin 10 units/ml or 100 units/ml   Complete by: As directed    Home infusion instructions - [facility]   Complete by: As directed    Instructions: Flush IV access with Sodium Chloride 0.9% and Heparin 10units/ml or 100units/ml   Change dressing on IV access line: Weekly and PRN   Instructions Cath Flo $Remove'2mg'pWDANtv$ : Administer for PICC Line occlusion and as ordered by physician for other access device   [facility] pharmacist to adjust dose for: Vancomycin, Aminoglycosides and other anti-infective therapies as requested by physician   Increase activity slowly   Complete by: As directed    Method of administration may be changed at the discretion of home infusion pharmacist based upon assessment of the patient and/or caregivers ability to self-administer the medication ordered   Complete by: As directed       Allergies as of 11/23/2021       Reactions   Aldactone [spironolactone] Other (See Comments)   Unknown reaction        Medication List     TAKE these medications    acetaminophen 325 MG tablet Commonly known as: TYLENOL Take 2 tablets (650 mg total) by mouth every 6 (six) hours as needed for mild pain (or Fever >/= 101).   atorvastatin 10 MG tablet Commonly known as: LIPITOR TAKE 1 TABLET DAILY   docusate sodium 100 MG capsule Commonly known as: COLACE Take 1 capsule (100 mg total) by mouth 2 (two) times daily.   ertapenem  IVPB Commonly known as: INVANZ Inject 1 g into the vein daily for 14 days. Indication:  ESBL E coli w/stents First Dose: Yes Last Day of Therapy:  12/06/21 Labs - Once weekly:  CBC/D and BMP, Labs - Every other week:  ESR and CRP Method of administration: Mini-Bag Plus / Gravity Method of administration may be changed at the discretion of home infusion pharmacist based upon assessment of the patient and/or caregiver's ability to self-administer the medication ordered.   feeding supplement Liqd Take 237 mLs by mouth 3  (three) times daily between meals. What changed: when to take this   Gerhardt's butt cream Crea Apply 1 application topically 3 (three) times daily.   HYDROcodone-acetaminophen 5-325 MG tablet Commonly known as: NORCO/VICODIN Take 1 tablet by mouth every 6 (six) hours as needed for moderate pain.   loperamide 2 MG tablet Commonly known as: IMODIUM A-D Take 2-4 mg by mouth 4 (four) times daily as needed for diarrhea or loose stools.   metoprolol tartrate 25 MG tablet Commonly known as: LOPRESSOR Take 25-37.5 mg by mouth See admin instructions. Take 25 mg by mouth in the morning then 37.5 (1.5 tablets) in the evening   pantoprazole 40 MG tablet Commonly known as: Protonix Take 1 tablet (40 mg total) by mouth 2 (two) times daily.   potassium chloride SA 20 MEQ tablet Commonly known as: KLOR-CON M Take 2 tablets (40 mEq total) by mouth daily. What changed: when to take this   prochlorperazine 10 MG tablet Commonly known as: COMPAZINE TAKE 1 TABLET(10 MG) BY MOUTH EVERY 6 HOURS AS NEEDED FOR NAUSEA OR VOMITING   tamsulosin 0.4 MG Caps capsule Commonly known as: FLOMAX Take 0.4 mg by mouth daily.   torsemide 20 MG tablet Commonly known as: DEMADEX Take 1 tablet (20 mg  total) by mouth 2 (two) times daily.   Xarelto 20 MG Tabs tablet Generic drug: rivaroxaban Take 1 tablet (20 mg total) by mouth daily with supper.               Discharge Care Instructions  (From admission, onward)           Start     Ordered   11/23/21 0000  Discharge wound care:       Comments: Per wound care nurse   11/23/21 1007   11/22/21 0000  Change dressing on IV access line weekly and PRN  (Home infusion instructions - [facility] )        11/22/21 0947            Follow-up Information     [facility] Follow up.   Specialty: Home Health Services Why: For Home Health Contact information: [address] [place]  [address] [phone]         [facility], [facility] Follow up.   Specialty: Home Health Services Why: This is [facility] [phone]. If you have any questions please call this number.        Rene Paci, MD Follow up in 1 week(s).   Specialty: Urology Why: Hospital follow-up appointment Contact information: [address] [place] [address] [phone]                Allergies  Allergen Reactions   Aldactone [Spironolactone] Other (See Comments)    Unknown reaction     Consultations: Infectious disease   Procedures/Studies: DG CHEST PORT 1 VIEW  Result Date: 11/22/2021 CLINICAL DATA:  Status post PICC line placement. EXAM: PORTABLE CHEST 1 VIEW COMPARISON:  Chest x-ray dated November 19, 2021 FINDINGS: Interval placement of right-sided PICC line with tip positioned near the expected area of the superior cavoatrial junction. Cardiac and mediastinal contours are unchanged. Mild right basilar atelectasis. Elevation of the right hemidiaphragm, unchanged compared to prior. No large pleural effusion or evidence of pneumothorax. IMPRESSION: Interval placement of right arm PICC with tip positioned near the expected area of the superior cavoatrial junction. Electronically Signed   By: Allegra Lai M.D.   On: 11/22/2021 16:27   DG Chest Port 1 View  Result Date: 11/19/2021 CLINICAL DATA:  80 year old male with weakness. EXAM: PORTABLE CHEST - 1 VIEW COMPARISON:  09/16/2021 FINDINGS: The mediastinal contours are within normal limits. Unchanged cardiomegaly. Low lung volumes. Similar appearing chronic elevation the right hemidiaphragm. Mild bibasilar streaky opacities. No pleural effusion or pneumothorax. No acute osseous abnormality. IMPRESSION: Low lung volumes with bibasilar subsegmental atelectasis. Electronically Signed   By: Marliss Coots M.D.   On: 11/19/2021 12:41   DG C-Arm 1-60 Min-No Report  Result Date:  11/05/2021 Fluoroscopy was utilized by the requesting physician.  No radiographic interpretation.   [place] EKG SITE RITE  Result Date: 11/22/2021 If [facility] image not attached, placement could not be confirmed due to current cardiac rhythm.    Subjective: No complaints  Discharge Exam: Vitals:   11/22/21 2128 11/23/21 0544  BP: (!) 105/59 109/62  Pulse: 70 88  Resp: 18 16  Temp: 98 F (36.7 C) 98.3 F (36.8 C)  SpO2: 98% 95%   Vitals:   11/21/21 2006 11/22/21 1457 11/22/21 2128 11/23/21 0544  BP: (!) 96/58 (!) 108/59 (!) 105/59 109/62  Pulse: (!) 57 67 70 88  Resp: 16 16 18 16   Temp: 97.6 F (36.4  C) (!) 97.5 F (36.4 C) 98 F (36.7 C) 98.3 F (36.8 C)  TempSrc: Oral Oral Axillary Oral  SpO2: 97% 97% 98% 95%  Weight:      Height:        General: Pt is alert, awake, not in acute distress Cardiovascular: RRR, S1/S2 +, no rubs, no gallops Respiratory: CTA bilaterally, no wheezing, no rhonchi Abdominal: Soft, NT, ND, bowel sounds + Extremities: no edema, no cyanosis    The results of significant diagnostics from this hospitalization (including imaging, microbiology, ancillary and laboratory) are listed below for reference.     Microbiology: Recent Results (from the past 240 hour(s))  Blood culture (routine x 2)     Status: None (Preliminary result)   Collection Time: 11/19/21 12:00 PM   Specimen: BLOOD  Result Value Ref Range Status   Specimen Description   Final    BLOOD RIGHT ANTECUBITAL Performed at [facility] [address] [place], [place] [address]    Special Requests   Final    BOTTLES DRAWN AEROBIC AND ANAEROBIC Blood Culture adequate volume Performed at [facility] [address] [place], [place] [address]    Culture   Final    NO GROWTH 4 DAYS Performed at [facility], [place] [address], [place], [place] [address]    Report Status PENDING  Incomplete  Blood culture (routine x 2)     Status: None  (Preliminary result)   Collection Time: 11/19/21 12:55 PM   Specimen: BLOOD  Result Value Ref Range Status   Specimen Description   Final    BLOOD LEFT ANTECUBITAL Performed at [facility] [address] [place], [place] [address]    Special Requests   Final    BOTTLES DRAWN AEROBIC AND ANAEROBIC Blood Culture adequate volume Performed at [facility] [address] [place], [place] [address]    Culture   Final    NO GROWTH 4 DAYS Performed at [facility], [place] [address] [place], [place] [address]    Report Status PENDING  Incomplete  Urine Culture     Status: Abnormal   Collection Time: 11/19/21  3:49 PM   Specimen: Urine, Clean Catch  Result Value Ref Range Status   Specimen Description   Final    URINE, CLEAN CATCH Performed at [facility], [place] [address], [place], [place] [address]    Special Requests   Final    NONE Performed at [facility], [place] [address] [place],  [address]    Culture (A)  Final    >=100,000 COLONIES/mL ESCHERICHIA COLI Confirmed Extended Spectrum Beta-Lactamase Producer (ESBL).  In bloodstream infections from ESBL organisms, carbapenems are preferred over piperacillin/tazobactam. They are shown to have a lower risk of mortality.    Report Status 11/22/2021 FINAL  Final   Organism ID, Bacteria ESCHERICHIA COLI (A)  Final      Susceptibility   Escherichia coli - MIC*    AMPICILLIN >=32 RESISTANT Resistant     CEFAZOLIN >=64 RESISTANT Resistant     CEFEPIME 16 RESISTANT Resistant     CEFTRIAXONE >=64 RESISTANT Resistant     CIPROFLOXACIN >=4 RESISTANT Resistant     GENTAMICIN >=16 RESISTANT Resistant     IMIPENEM <=0.25 SENSITIVE Sensitive     NITROFURANTOIN <=16 SENSITIVE Sensitive     TRIMETH/SULFA >=320 RESISTANT Resistant     AMPICILLIN/SULBACTAM >=32 RESISTANT Resistant     PIP/TAZO 64  INTERMEDIATE Intermediate     * >=100,000 COLONIES/mL  ESCHERICHIA COLI     Labs: BNP (last 3 results) Recent Labs    08/09/21 0924 09/16/21 2035  BNP 217.3* 83.2   Basic Metabolic Panel: Recent Labs  Lab 11/19/21 1248 11/20/21 0621  NA 140 138  K 4.0 4.1  CL 106 109  CO2 26 25  GLUCOSE 107* 99  BUN 25* 22  CREATININE 2.05* 1.68*  CALCIUM 8.4* 8.3*   Liver Function Tests: Recent Labs  Lab 11/19/21 1248 11/20/21 0621  AST 12* 12*  ALT 10 9  ALKPHOS 65 54  BILITOT 0.6 0.7  PROT 6.3* 5.7*  ALBUMIN 3.2* 2.9*   No results for input(s): LIPASE, AMYLASE in the last 168 hours. No results for input(s): AMMONIA in the last 168 hours. CBC: Recent Labs  Lab 11/19/21 1248 11/20/21 0621  WBC 4.0 4.0  HGB 9.9* 10.0*  HCT 31.6* 31.3*  MCV 98.4 98.1  PLT 216 179   Cardiac Enzymes: No results for input(s): CKTOTAL, CKMB, CKMBINDEX, TROPONINI in the last 168 hours. BNP: Invalid input(s): POCBNP CBG: No results for input(s): GLUCAP in the last 168 hours. D-Dimer No results for input(s): DDIMER in the last 72 hours. Hgb A1c No results for input(s): HGBA1C in the last 72 hours. Lipid Profile No results for input(s): CHOL, HDL, LDLCALC, TRIG, CHOLHDL, LDLDIRECT in the last 72 hours. Thyroid function studies No results for input(s): TSH, T4TOTAL, T3FREE, THYROIDAB in the last 72 hours.  Invalid input(s): FREET3 Anemia work up No results for input(s): VITAMINB12, FOLATE, FERRITIN, TIBC, IRON, RETICCTPCT in the last 72 hours. Urinalysis    Component Value Date/Time   COLORURINE RED (A) 11/19/2021 1248   APPEARANCEUR TURBID (A) 11/19/2021 1248   LABSPEC 1.011 11/19/2021 1248   PHURINE 5.0 11/19/2021 1248   GLUCOSEU NEGATIVE 11/19/2021 1248   HGBUR LARGE (A) 11/19/2021 1248   BILIRUBINUR NEGATIVE 11/19/2021 1248   KETONESUR NEGATIVE 11/19/2021 1248   PROTEINUR 100 (A) 11/19/2021 1248   NITRITE NEGATIVE 11/19/2021 1248   LEUKOCYTESUR LARGE (A) 11/19/2021 1248   Sepsis Labs Invalid input(s): PROCALCITONIN,  WBC,   LACTICIDVEN Microbiology Recent Results (from the past 240 hour(s))  Blood culture (routine x 2)     Status: None (Preliminary result)   Collection Time: 11/19/21 12:00 PM   Specimen: BLOOD  Result Value Ref Range Status   Specimen Description   Final    BLOOD RIGHT ANTECUBITAL Performed at [facility], [address], [place], [place] [address]    Special Requests   Final    BOTTLES DRAWN AEROBIC AND ANAEROBIC Blood Culture adequate volume Performed at [facility], [address], [place], [place] [address]    Culture   Final    NO GROWTH 4 DAYS Performed at [facility], [address], [place], [place] [address]    Report Status PENDING  Incomplete  Blood culture (routine x 2)     Status: None (Preliminary result)   Collection Time: 11/19/21 12:55 PM   Specimen: BLOOD  Result Value Ref Range Status   Specimen Description   Final    BLOOD LEFT ANTECUBITAL Performed at [facility], [address] [place], [place] [address]    Special Requests   Final    BOTTLES DRAWN AEROBIC AND ANAEROBIC Blood Culture adequate volume Performed at [facility], [address], [place], [place] [address]    Culture   Final  NO GROWTH 4 DAYS Performed at [facility], [place] [address], [place], [place] [address]    Report Status PENDING  Incomplete  Urine Culture     Status: Abnormal   Collection Time: 11/19/21  3:49 PM   Specimen: Urine, Clean Catch  Result Value Ref Range Status   Specimen Description   Final    URINE, CLEAN CATCH Performed at [facility], [place] [address], [place], [place] [address]    Special Requests   Final    NONE Performed at [facility], [place] [address], [place], [place] [address]    Culture (A)  Final    >=100,000 COLONIES/mL ESCHERICHIA COLI Confirmed Extended Spectrum Beta-Lactamase Producer (ESBL).  In bloodstream infections  from ESBL organisms, carbapenems are preferred over piperacillin/tazobactam. They are shown to have a lower risk of mortality.    Report Status 11/22/2021 FINAL  Final   Organism ID, Bacteria ESCHERICHIA COLI (A)  Final      Susceptibility   Escherichia coli - MIC*    AMPICILLIN >=32 RESISTANT Resistant     CEFAZOLIN >=64 RESISTANT Resistant     CEFEPIME 16 RESISTANT Resistant     CEFTRIAXONE >=64 RESISTANT Resistant     CIPROFLOXACIN >=4 RESISTANT Resistant     GENTAMICIN >=16 RESISTANT Resistant     IMIPENEM <=0.25 SENSITIVE Sensitive     NITROFURANTOIN <=16 SENSITIVE Sensitive     TRIMETH/SULFA >=320 RESISTANT Resistant     AMPICILLIN/SULBACTAM >=32 RESISTANT Resistant     PIP/TAZO 64 INTERMEDIATE Intermediate     * >=100,000 COLONIES/mL ESCHERICHIA COLI     Time coordinating discharge: Over 30 minutes  SIGNED:   [name], MD  [facility] 11/23/2021, 10:08 AM Pager   If 7PM-7AM, please contact night-coverage [email or web address] Password [email or web address]

## 2021-11-23 NOTE — Consult Note (Signed)
[facility] Inpatient Consult   11/23/2021  Jakaden Ouzts November 08, 1940 [id number]  [facility] CM)   Chart reviewed with noted high risk score for unplanned readmission. Patient is currently actively being followed by [facility] CM RN case manager for chronic disease management and care coordination services.   Plan: Will update outpatient case manager of patient disposition.  Of note, [facility] Care Management services does not replace or interfere with any services that are arranged by inpatient case management or social work.   Netta Cedars, MSN, RN [facility] [employer] [phone]  Toll free office [phone]

## 2021-11-23 NOTE — TOC Transition Note (Signed)
Transition of Care [facility]) - CM/SW Discharge Note   Patient Details  Name: Patrick Bishop MRN: [id number] Date of Birth: Aug 31, 1941  [facility]) CM/SW Contact:  Lynnell Catalan, RN Phone Number: 11/23/2021, 1:14 PM   Clinical Narrative:    Pt to dc home via PTAR today. Address confirmed with facesheet.Yellow DNR on the chart to be signed by MD. Jeannene Patella from [place] to see pt at bedside today prior to dc.   Final next level of care: [place] Barriers to Discharge: No Barriers Identified   Patient Goals and CMS Choice Patient states their goals for this hospitalization and ongoing recovery are:: To get better CMS Medicare.gov Compare Post Acute Care list provided to:: Patient Choice offered to / list presented to : Patient, Spouse   Discharge Plan and Services      HH Arranged: RN [facility] Agency: [place] Date [facility] Agency Contacted: 11/22/21 Time [place]: [phone] Representative spoke with at [place]: Tommi Rumps    Readmission Risk Interventions No flowsheet data found.

## 2021-11-24 DIAGNOSIS — C670 Malignant neoplasm of trigone of bladder: Secondary | ICD-10-CM | POA: Diagnosis not present

## 2021-11-24 DIAGNOSIS — N189 Chronic kidney disease, unspecified: Secondary | ICD-10-CM | POA: Diagnosis not present

## 2021-11-24 DIAGNOSIS — I129 Hypertensive chronic kidney disease with stage 1 through stage 4 chronic kidney disease, or unspecified chronic kidney disease: Secondary | ICD-10-CM | POA: Diagnosis not present

## 2021-11-24 DIAGNOSIS — J9611 Chronic respiratory failure with hypoxia: Secondary | ICD-10-CM | POA: Diagnosis not present

## 2021-11-24 DIAGNOSIS — L89623 Pressure ulcer of left heel, stage 3: Secondary | ICD-10-CM | POA: Diagnosis not present

## 2021-11-24 DIAGNOSIS — N1330 Unspecified hydronephrosis: Secondary | ICD-10-CM | POA: Diagnosis not present

## 2021-11-24 LAB — CULTURE, BLOOD (ROUTINE X 2)
Culture: NO GROWTH
Culture: NO GROWTH
Special Requests: ADEQUATE
Special Requests: ADEQUATE

## 2021-11-26 ENCOUNTER — Other Ambulatory Visit: Payer: Self-pay | Admitting: *Deleted

## 2021-11-26 NOTE — Patient Outreach (Signed)
[facility]) Care Management Telephonic RN Care Manager Note   11/26/2021 Name:  Patrick Bishop MRN:  [id number] DOB:  [birth year]  Summary: Recent admission for sepsis pt started regimen for IV antibiotics. Post-prone HHPT/OT due to ongoing weakness however RN remains involved. Pt will follow up next week with labs and providers.   Recommendations/Changes made from today's visit: Encouraged pt to continue participation with [facility] when they re-enter services, medication adherence and follow up appointments with all providers.  Subjective: Patrick Bishop is an 81 y.o. year old male who is a primary patient of [name], [name], MD. The care management team was consulted for assistance with care management and/or care coordination needs.    Telephonic RN Care Manager completed Telephone Visit today.  Objective:   Medications Reviewed Today     Reviewed by [name], MD (Physician) on 11/19/21 at 1651  Med List Status: Complete   Medication Order Taking? Sig Documenting Provider Last Dose Status Informant  acetaminophen (TYLENOL) 325 MG tablet [id number] No Take 2 tablets (650 mg total) by mouth every 6 (six) hours as needed for mild pain (or Fever >/= 101).  Patient not taking: Reported on 11/02/2021   [name], MD Not Taking Active Multiple Informants  atorvastatin (LIPITOR) 10 MG tablet [id number] Yes TAKE 1 TABLET DAILY  Patient taking differently: Take 10 mg by mouth daily.   [name], MD 11/18/2021 Active Multiple Informants  docusate sodium (COLACE) 100 MG capsule [id number] No Take 1 capsule (100 mg total) by mouth 2 (two) times daily.  Patient not taking: Reported on 11/02/2021   [name], MD Not Taking Active Multiple Informants  feeding supplement (ENSURE ENLIVE / ENSURE PLUS) LIQD [id number] Yes Take 237 mLs by mouth 3 (three) times daily between meals.  Patient taking differently: Take 237 mLs by mouth daily.   [name], MD Past Week Active Multiple Informants           Med Note [name], [name]   Fri Nov 19, 2021  1:28 PM) Non-compliant  HYDROcodone-acetaminophen (NORCO/VICODIN) 5-325 MG tablet [id number] Yes Take 1 tablet by mouth every 6 (six) hours as needed for moderate pain. [name], MD 11/19/2021 Active Multiple Informants  loperamide (IMODIUM A-D) 2 MG tablet [id number] Yes Take 2-4 mg by mouth 4 (four) times daily as needed for diarrhea or loose stools. [provider] Past Week Active Multiple Informants  metoprolol tartrate (LOPRESSOR) 25 MG tablet [id number] Yes Take 25-37.5 mg by mouth See admin instructions. Take 25 mg by mouth in the morning then 37.5 (1.5 tablets) in the evening [provider] 11/19/2021 0730 Active Multiple Informants  Nystatin (GERHARDT'S BUTT CREAM) CREA [id number] No Apply 1 application topically 3 (three) times daily.  Patient not taking: Reported on 11/02/2021   [name], MD Not Taking Active Multiple Informants  pantoprazole (PROTONIX) 40 MG tablet [id number] No Take 1 tablet (40 mg total) by mouth 2 (two) times daily.  Patient not taking: Reported on 11/02/2021   [name], MD Not Taking Active Multiple Informants  potassium chloride SA (KLOR-CON) 20 MEQ tablet [id number] Yes Take 2 tablets (40 mEq total) by mouth daily.  Patient taking differently: Take 40 mEq by mouth every morning.   [name], MD 11/19/2021 Active Multiple Informants  prochlorperazine (COMPAZINE) 10 MG tablet [id number] No TAKE 1 TABLET(10 MG) BY MOUTH EVERY 6 HOURS AS NEEDED FOR NAUSEA OR VOMITING  Patient not taking: Reported on 11/02/2021   Zola Button  N, MD Not Taking Active Multiple Informants  tamsulosin (FLOMAX) 0.4 MG CAPS capsule [id number] Yes Take 0.4 mg by mouth daily. [provider] 11/19/2021 Active Multiple Informants  torsemide (DEMADEX) 20 MG tablet [id number] Yes Take 1 tablet (20 mg total) by mouth 2 (two) times daily. [name], MD 11/19/2021 Active Multiple Informants  XARELTO 20 MG TABS tablet [id number] Yes Take 1 tablet (20 mg total) by mouth daily with supper. [name], MD 11/18/2021 1900 Active Multiple Informants           Med Note Caryn Section, Marylyn Ishihara A   Tue Nov 02, 2021 10:07 AM)               SDOH:  (Social Determinants of Health) assessments and interventions performed:     Care Plan  Review of patient past medical history, allergies, medications, health status, including review of consultants reports, laboratory and other test data, was performed as part of comprehensive evaluation for care management services.   Care Plan : RN Care Manager Plan of Care  Updates made by Tobi Bastos, RN since 11/26/2021 12:00 AM     Problem: Knowledge Deficit related to Hypertension   Priority: High     Long-Range Goal: Devvelopment of a Plan of Care for management of Hypertension   Start Date: 10/15/2021  Expected End Date: 03/25/2022  This Visit's Progress: On track  Recent Progress: On track  Priority: High  Note:   Current Barriers:  Knowledge Deficits related to plan of care for management of HTN   RNCM Clinical Goal(s):  Patient will verbalize understanding of plan for management of HTN as evidenced by teach-back method of understanding take all medications exactly as prescribed and will call provider for medication related questions as evidenced by self report and chart notification  through collaboration with RN Care manager, provider, and care team.   Interventions: Inter-disciplinary care team collaboration (see longitudinal plan of care) Evaluation of current treatment plan related to  self management and patient's adherence to plan as established by provider   Hypertension Interventions:  (Status:  Goal on track:  Yes.) Long Term Goal Last practice recorded BP readings:  BP Readings from Last 3 Encounters:  10/07/21 (!) 90/58  09/21/21 (!) 107/58  08/13/21 106/71  Most  recent eGFR/CrCl:  Lab Results  Component Value Date   EGFR 27 (L) 07/22/2021    No components found for: CRCL  Evaluation of current treatment plan related to hypertension self management and patient's adherence to plan as established by provider Provided education to patient re: stroke prevention, s/s of heart attack and stroke Reviewed medications with patient and discussed importance of compliance Provided assistance with obtaining home blood pressure monitor via [facility] services will mail this DME to pt's confirmed address; Discussed plans with patient for ongoing care management follow up and provided patient with direct contact information for care management team Advised patient, providing education and rationale, to monitor blood pressure daily and record, calling PCP for findings outside established parameters Reviewed scheduled/upcoming provider appointments including:  Discussed complications of poorly controlled blood pressure such as heart disease, stroke, circulatory complications, vision complications, kidney impairment, sexual dysfunction Screening for signs and symptoms of depression related to chronic disease state  Assessed social determinant of health barriers  Update 2/20-Spoke with pt and permitted to speak with the caregiver in the home Aldean Ast in the home. Reports pt is doing well with ongoing [facility] in the home for PT/OT/RN/Aide services. Denies any falls  however pt is very limited with his mobility unable to stand or walk at this time. Reports good blood pressures from the providers with no acute issues or events. Will reiterate on the discussed plan of care and continue to offer available services for social worker or pharmacy with ongoing needs however encouraged caregiver to outreach to the involved agency while active with [facility] if these services are available. Verified all appointments with a recent follow with urologist post op stent placed. Update 3/3 Post op  hospital visit. Pt reports he is doing well and provided permission to speak with caregiver today [name]) who indicated pt has been very weak and HHPT/OT has been post-prone until pt regains his strength. States the [facility] continues to visit with a pending appointment on Monday, infectious provider on next Friday and nephrologist on Tuesday to follow up with additional labs concerning his infection. Pt started back on antibiotics 3 days ago. Reports blood pressures readings have been good with no acute readings since the last conversation.  Patient Goals/Self-Care Activities: Take all medications as prescribed Attend all scheduled provider appointments Call pharmacy for medication refills 3-7 days in advance of running out of medications Attend church or other social activities Perform all self care activities independently  Perform IADL's (shopping, preparing meals, housekeeping, managing finances) independently Call provider office for new concerns or questions  check blood pressure weekly choose a place to take my blood pressure (home, clinic or office, retail store) write blood pressure results in a log or diary learn about high blood pressure keep a blood pressure log take blood pressure log to all doctor appointments call doctor for signs and symptoms of high blood pressure keep all doctor appointments take medications for blood pressure exactly as prescribed report new symptoms to your doctor  Follow Up Plan:  Telephone follow up appointment with care management team member scheduled for:  April 2023 The patient has been provided with contact information for the care management team and has been advised to call with any health related questions or concerns.       Raina Mina, RN Care Management Coordinator [place] Office [phone]

## 2021-11-29 DIAGNOSIS — I129 Hypertensive chronic kidney disease with stage 1 through stage 4 chronic kidney disease, or unspecified chronic kidney disease: Secondary | ICD-10-CM | POA: Diagnosis not present

## 2021-11-29 DIAGNOSIS — C670 Malignant neoplasm of trigone of bladder: Secondary | ICD-10-CM | POA: Diagnosis not present

## 2021-11-29 DIAGNOSIS — N189 Chronic kidney disease, unspecified: Secondary | ICD-10-CM | POA: Diagnosis not present

## 2021-11-29 DIAGNOSIS — N390 Urinary tract infection, site not specified: Secondary | ICD-10-CM | POA: Diagnosis not present

## 2021-11-29 DIAGNOSIS — J9611 Chronic respiratory failure with hypoxia: Secondary | ICD-10-CM | POA: Diagnosis not present

## 2021-11-29 DIAGNOSIS — L89623 Pressure ulcer of left heel, stage 3: Secondary | ICD-10-CM | POA: Diagnosis not present

## 2021-11-29 DIAGNOSIS — N1330 Unspecified hydronephrosis: Secondary | ICD-10-CM | POA: Diagnosis not present

## 2021-12-02 DIAGNOSIS — A4151 Sepsis due to Escherichia coli [E. coli]: Secondary | ICD-10-CM | POA: Diagnosis not present

## 2021-12-03 ENCOUNTER — Other Ambulatory Visit: Payer: Self-pay

## 2021-12-03 ENCOUNTER — Encounter: Payer: Self-pay | Admitting: Internal Medicine

## 2021-12-03 ENCOUNTER — Telehealth: Payer: Self-pay

## 2021-12-03 ENCOUNTER — Ambulatory Visit (INDEPENDENT_AMBULATORY_CARE_PROVIDER_SITE_OTHER): Payer: Medicare Other | Admitting: Internal Medicine

## 2021-12-03 VITALS — BP 104/71 | HR 139 | Temp 97.4°F

## 2021-12-03 DIAGNOSIS — N390 Urinary tract infection, site not specified: Secondary | ICD-10-CM

## 2021-12-03 DIAGNOSIS — I129 Hypertensive chronic kidney disease with stage 1 through stage 4 chronic kidney disease, or unspecified chronic kidney disease: Secondary | ICD-10-CM | POA: Diagnosis not present

## 2021-12-03 DIAGNOSIS — L89623 Pressure ulcer of left heel, stage 3: Secondary | ICD-10-CM | POA: Diagnosis not present

## 2021-12-03 DIAGNOSIS — J9611 Chronic respiratory failure with hypoxia: Secondary | ICD-10-CM | POA: Diagnosis not present

## 2021-12-03 DIAGNOSIS — N189 Chronic kidney disease, unspecified: Secondary | ICD-10-CM | POA: Diagnosis not present

## 2021-12-03 DIAGNOSIS — C670 Malignant neoplasm of trigone of bladder: Secondary | ICD-10-CM | POA: Diagnosis not present

## 2021-12-03 DIAGNOSIS — N1330 Unspecified hydronephrosis: Secondary | ICD-10-CM | POA: Diagnosis not present

## 2021-12-03 NOTE — Telephone Encounter (Signed)
Per MD pull picc after last dose abx on 12/05/21. I called advance and spoke with the pharmacist Jeani Hawking and her verbalized his understanding. ? ?Adelfa Koh, CMA ? ?

## 2021-12-03 NOTE — Progress Notes (Signed)
Patient: Patrick Bishop  DOB: 02/07/1941 MRN: [id number] PCP: Lujean Amel, MD   Chief Complaint  Patient presents with   Hospitalization Follow-up     Patient Active Problem List   Diagnosis Date Noted   Acute UTI 11/19/2021   Sepsis ([place]) 09/17/2021   Diarrhea 09/17/2021   Acute on chronic renal failure ([place]) 09/17/2021   Pressure injury of left heel, stage 2 ([place]) 09/17/2021   SIRS (systemic inflammatory response syndrome) ([place]) 09/17/2021   Chronic respiratory failure with hypoxia ([place]) 09/17/2021   Anemia of chronic disease 09/17/2021   Lactic acidosis    Dehydration    Chronic diastolic heart failure (HCC)    Pressure injury of skin 08/10/2021   Generalized weakness 08/09/2021   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   Pre-op evaluation 09/28/2020   Goals of care, counseling/discussion 08/27/2019   Pneumonia due to COVID-19 virus 08/05/2019   Pneumonia 08/03/2019   Suspected COVID-19 virus infection    Shortness of breath 08/02/2019   Leukopenia    Heme + stool    Decreased hemoglobin    Hydronephrosis with obstructing calculus 06/24/2019   Cholelithiasis 06/24/2019   Arthritis 07/12/2018   Chronic pain 07/12/2018   Hearing loss 07/12/2018   Lymphedema 07/12/2018   Muscle weakness 07/12/2018   Unsteady gait 07/12/2018   Morbid obesity ([place]) 07/08/2018   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 M with PMHx including Cancer of trigone of urinary bladder as below admitted 2/24-28 at [facility] for ESBL UTI. He underwent cystoscopy and left ureteral stent exchange on 11/05/21.   Placed on Ertapenem x 14 days EOT 12/05/21.  Today: Pt reports he is adherent with antibiotics. No issues with PICC line.  Review of Systems  All other systems reviewed and are negative.  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  Also s/p Bilateral GSV Ablation.   Dizziness    Dyspnea    Essential hypertension    History of kidney stones    Hx of adenomatous colonic polyps    Hyperlipidemia with target LDL less than 100    Longstanding persistent atrial fibrillation ([place])    Nephrolithiasis 05/2019   CKD stage 3   Obesity    OSA on CPAP    does not cpap    Pneumonia 08/2019   Pre-diabetes    Pulmonary hypertension ([place])    Related to OSA, partial component of HFpEF   Pyelonephritis 05/2019   Tumor    Left arm    Outpatient Medications Prior to Visit  Medication Sig Dispense Refill   atorvastatin (LIPITOR) 10 MG tablet TAKE 1 TABLET  DAILY (Patient taking differently: Take 10 mg by mouth daily.) 90 tablet 0   ertapenem (INVANZ) IVPB Inject 1 g into the vein daily for 14 days. Indication:  ESBL E coli w/stents First Dose: Yes Last Day of Therapy:  12/06/21 Labs - Once weekly:  CBC/D and BMP, Labs - Every other week:  ESR and CRP Method of administration: Mini-Bag Plus / Gravity Method of administration may be changed at the discretion of home infusion pharmacist based upon assessment of the patient and/or caregiver's ability to self-administer the medication ordered. 14 Units 0   feeding supplement (ENSURE ENLIVE / ENSURE PLUS) LIQD Take 237 mLs by mouth 3 (three) times daily between meals. (Patient taking differently:  Take 237 mLs by mouth daily.) 237 mL 12   HYDROcodone-acetaminophen (NORCO/VICODIN) 5-325 MG tablet Take 1 tablet by mouth every 6 (six) hours as needed for moderate pain. 12 tablet 0   loperamide (IMODIUM A-D) 2 MG tablet Take 2-4 mg by mouth 4 (four) times daily as needed for diarrhea or loose stools.     metoprolol tartrate (LOPRESSOR) 25 MG tablet Take 25-37.5 mg by mouth See admin instructions. Take 25 mg by mouth in the morning then 37.5 (1.5 tablets) in the evening     tamsulosin (FLOMAX) 0.4 MG CAPS capsule Take 0.4 mg by mouth daily.     torsemide (DEMADEX) 20 MG tablet Take 1 tablet (20 mg total) by mouth 2 (two) times daily.     XARELTO 20 MG TABS tablet Take 1 tablet (20 mg total) by mouth daily with supper. 90 tablet 3   acetaminophen (TYLENOL) 325 MG tablet Take 2 tablets (650 mg total) by mouth every 6 (six) hours as needed for mild pain (or Fever >/= 101). (Patient not taking: Reported on 11/02/2021)     docusate sodium (COLACE) 100 MG capsule Take 1 capsule (100 mg total) by mouth 2 (two) times daily. (Patient not taking: Reported on 11/02/2021) 10 capsule 0   Nystatin (GERHARDT'S BUTT CREAM) CREA Apply 1 application topically 3 (three) times daily. (Patient not taking: Reported on 11/02/2021)     pantoprazole (PROTONIX) 40 MG tablet Take 1 tablet (40 mg total) by mouth 2 (two) times daily. (Patient not taking: Reported on 11/02/2021) 60 tablet 11   potassium chloride SA (KLOR-CON) 20 MEQ tablet Take 2 tablets (40 mEq total) by mouth daily. (Patient not taking: Reported on 12/03/2021) 30 tablet 0   prochlorperazine (COMPAZINE) 10 MG tablet TAKE 1 TABLET(10 MG) BY MOUTH EVERY 6 HOURS AS NEEDED FOR NAUSEA OR VOMITING (Patient not taking: Reported on 11/02/2021) 30 tablet 0   No facility-administered medications prior to visit.     Allergies  Allergen Reactions   Aldactone [Spironolactone] Other (See Comments)    Unknown reaction     Social History   Tobacco Use   Smoking status: Former     Packs/day: 1.00    Years: 35.00    Pack years: 35.00    Types: Cigarettes    Quit date: 1996    Years since quitting: 27.2   Smokeless tobacco: Never   Tobacco comments:    smoked from age [age]   Vaping Use   Vaping Use: Never used  Substance Use Topics   Alcohol use: Yes    Comment: occasional   Drug use: No    Family History  Problem Relation Age of Onset   Colon cancer Mother    Heart attack Father 16   Hypertension Brother    Cancer  Sister    Colon cancer Sister    Other Neg Hx        He really does not know much about his parents and siblings health, but they did not speak about healthcare    Objective:   Vitals:   12/03/21 0944  BP: 104/71  Pulse: (!) 139  Temp: (!) 97.4 F (36.3 C)  TempSrc: Temporal   There is no height or weight on file to calculate BMI.  Physical Exam Constitutional:      General: He is not in acute distress.    Appearance: He is normal weight. He is not toxic-appearing.  HENT:     Head: Normocephalic and atraumatic.     Right Ear: External ear normal.     Left Ear: External ear normal.     Nose: No congestion or rhinorrhea.     Mouth/Throat:     Mouth: Mucous membranes are moist.     Pharynx: Oropharynx is clear.  Eyes:     Extraocular Movements: Extraocular movements intact.     Conjunctiva/sclera: Conjunctivae normal.     Pupils: Pupils are equal, round, and reactive to light.  Cardiovascular:     Rate and Rhythm: Normal rate and regular rhythm.     Heart sounds: No murmur heard.   No friction rub. No gallop.     Comments: RUE PICC line Pulmonary:     Effort: Pulmonary effort is normal.     Breath sounds: Normal breath sounds.  Abdominal:     General: Abdomen is flat. Bowel sounds are normal.     Palpations: Abdomen is soft.  Musculoskeletal:        General: No swelling. Normal range of motion.     Cervical back: Normal range of motion and neck supple.  Skin:    General: Skin is warm and dry.  Neurological:      General: No focal deficit present.     Mental Status: He is oriented to person, place, and time.  Psychiatric:        Mood and Affect: Mood normal.    Lab Results: Lab Results  Component Value Date   WBC 4.0 11/20/2021   HGB 10.0 (L) 11/20/2021   HCT 31.3 (L) 11/20/2021   MCV 98.1 11/20/2021   PLT 179 11/20/2021    Lab Results  Component Value Date   CREATININE 1.68 (H) 11/20/2021   BUN 22 11/20/2021   NA 138 11/20/2021   K 4.1 11/20/2021   CL 109 11/20/2021   CO2 25 11/20/2021    Lab Results  Component Value Date   ALT 9 11/20/2021   AST 12 (L) 11/20/2021   ALKPHOS 54 11/20/2021   BILITOT 0.7 11/20/2021     Assessment & Plan:  # ESBL UTI  #Complicated UTI Labs: Scr 1.61, ESR 48, CRP 85.13, wbc 5.9 Follows with Dr. Winter(urology in January 04, 2012). Consider removing stent as it is likely a nidus for infection Complete ertapenem x14 says on 12/05/21 and then PULL PICC Follow-up PRN  Laurice Record, MD [facility]   12/03/21  9:54 AM

## 2021-12-06 DIAGNOSIS — J9611 Chronic respiratory failure with hypoxia: Secondary | ICD-10-CM | POA: Diagnosis not present

## 2021-12-06 DIAGNOSIS — I129 Hypertensive chronic kidney disease with stage 1 through stage 4 chronic kidney disease, or unspecified chronic kidney disease: Secondary | ICD-10-CM | POA: Diagnosis not present

## 2021-12-06 DIAGNOSIS — L89623 Pressure ulcer of left heel, stage 3: Secondary | ICD-10-CM | POA: Diagnosis not present

## 2021-12-06 DIAGNOSIS — N1330 Unspecified hydronephrosis: Secondary | ICD-10-CM | POA: Diagnosis not present

## 2021-12-06 DIAGNOSIS — N189 Chronic kidney disease, unspecified: Secondary | ICD-10-CM | POA: Diagnosis not present

## 2021-12-06 DIAGNOSIS — C670 Malignant neoplasm of trigone of bladder: Secondary | ICD-10-CM | POA: Diagnosis not present

## 2021-12-08 DIAGNOSIS — I77811 Abdominal aortic ectasia: Secondary | ICD-10-CM | POA: Diagnosis not present

## 2021-12-08 DIAGNOSIS — I5032 Chronic diastolic (congestive) heart failure: Secondary | ICD-10-CM | POA: Diagnosis not present

## 2021-12-08 DIAGNOSIS — I4811 Longstanding persistent atrial fibrillation: Secondary | ICD-10-CM | POA: Diagnosis not present

## 2021-12-08 DIAGNOSIS — I272 Pulmonary hypertension, unspecified: Secondary | ICD-10-CM | POA: Diagnosis not present

## 2021-12-08 DIAGNOSIS — I739 Peripheral vascular disease, unspecified: Secondary | ICD-10-CM | POA: Diagnosis not present

## 2021-12-08 DIAGNOSIS — N184 Chronic kidney disease, stage 4 (severe): Secondary | ICD-10-CM | POA: Diagnosis not present

## 2021-12-08 DIAGNOSIS — Z79899 Other long term (current) drug therapy: Secondary | ICD-10-CM | POA: Diagnosis not present

## 2021-12-08 DIAGNOSIS — R5383 Other fatigue: Secondary | ICD-10-CM | POA: Diagnosis not present

## 2021-12-09 ENCOUNTER — Ambulatory Visit: Payer: Medicare Other | Admitting: *Deleted

## 2021-12-09 DIAGNOSIS — C670 Malignant neoplasm of trigone of bladder: Secondary | ICD-10-CM | POA: Diagnosis not present

## 2021-12-09 DIAGNOSIS — N1330 Unspecified hydronephrosis: Secondary | ICD-10-CM | POA: Diagnosis not present

## 2021-12-09 DIAGNOSIS — I129 Hypertensive chronic kidney disease with stage 1 through stage 4 chronic kidney disease, or unspecified chronic kidney disease: Secondary | ICD-10-CM | POA: Diagnosis not present

## 2021-12-09 DIAGNOSIS — J9611 Chronic respiratory failure with hypoxia: Secondary | ICD-10-CM | POA: Diagnosis not present

## 2021-12-09 DIAGNOSIS — L89623 Pressure ulcer of left heel, stage 3: Secondary | ICD-10-CM | POA: Diagnosis not present

## 2021-12-09 DIAGNOSIS — N189 Chronic kidney disease, unspecified: Secondary | ICD-10-CM | POA: Diagnosis not present

## 2021-12-10 DIAGNOSIS — I272 Pulmonary hypertension, unspecified: Secondary | ICD-10-CM | POA: Diagnosis not present

## 2021-12-10 DIAGNOSIS — N390 Urinary tract infection, site not specified: Secondary | ICD-10-CM | POA: Diagnosis not present

## 2021-12-10 DIAGNOSIS — Z993 Dependence on wheelchair: Secondary | ICD-10-CM | POA: Diagnosis not present

## 2021-12-10 DIAGNOSIS — I5022 Chronic systolic (congestive) heart failure: Secondary | ICD-10-CM | POA: Diagnosis not present

## 2021-12-10 DIAGNOSIS — I5032 Chronic diastolic (congestive) heart failure: Secondary | ICD-10-CM | POA: Diagnosis not present

## 2021-12-10 DIAGNOSIS — L89152 Pressure ulcer of sacral region, stage 2: Secondary | ICD-10-CM | POA: Diagnosis not present

## 2021-12-10 DIAGNOSIS — Z7901 Long term (current) use of anticoagulants: Secondary | ICD-10-CM | POA: Diagnosis not present

## 2021-12-10 DIAGNOSIS — A4151 Sepsis due to Escherichia coli [E. coli]: Secondary | ICD-10-CM | POA: Diagnosis not present

## 2021-12-10 DIAGNOSIS — C670 Malignant neoplasm of trigone of bladder: Secondary | ICD-10-CM | POA: Diagnosis not present

## 2021-12-10 DIAGNOSIS — N183 Chronic kidney disease, stage 3 unspecified: Secondary | ICD-10-CM | POA: Diagnosis not present

## 2021-12-10 DIAGNOSIS — I872 Venous insufficiency (chronic) (peripheral): Secondary | ICD-10-CM | POA: Diagnosis not present

## 2021-12-10 DIAGNOSIS — I130 Hypertensive heart and chronic kidney disease with heart failure and stage 1 through stage 4 chronic kidney disease, or unspecified chronic kidney disease: Secondary | ICD-10-CM | POA: Diagnosis not present

## 2021-12-10 DIAGNOSIS — J9611 Chronic respiratory failure with hypoxia: Secondary | ICD-10-CM | POA: Diagnosis not present

## 2021-12-10 DIAGNOSIS — G4733 Obstructive sleep apnea (adult) (pediatric): Secondary | ICD-10-CM | POA: Diagnosis not present

## 2021-12-10 DIAGNOSIS — Z6835 Body mass index (BMI) 35.0-35.9, adult: Secondary | ICD-10-CM | POA: Diagnosis not present

## 2021-12-10 DIAGNOSIS — E669 Obesity, unspecified: Secondary | ICD-10-CM | POA: Diagnosis not present

## 2021-12-10 DIAGNOSIS — Z9181 History of falling: Secondary | ICD-10-CM | POA: Diagnosis not present

## 2021-12-10 DIAGNOSIS — G8929 Other chronic pain: Secondary | ICD-10-CM | POA: Diagnosis not present

## 2021-12-10 DIAGNOSIS — Z4800 Encounter for change or removal of nonsurgical wound dressing: Secondary | ICD-10-CM | POA: Diagnosis not present

## 2021-12-10 DIAGNOSIS — Z79891 Long term (current) use of opiate analgesic: Secondary | ICD-10-CM | POA: Diagnosis not present

## 2021-12-10 DIAGNOSIS — I4821 Permanent atrial fibrillation: Secondary | ICD-10-CM | POA: Diagnosis not present

## 2021-12-10 DIAGNOSIS — M1990 Unspecified osteoarthritis, unspecified site: Secondary | ICD-10-CM | POA: Diagnosis not present

## 2021-12-10 DIAGNOSIS — Z960 Presence of urogenital implants: Secondary | ICD-10-CM | POA: Diagnosis not present

## 2021-12-13 DIAGNOSIS — L89152 Pressure ulcer of sacral region, stage 2: Secondary | ICD-10-CM | POA: Diagnosis not present

## 2021-12-13 DIAGNOSIS — I5032 Chronic diastolic (congestive) heart failure: Secondary | ICD-10-CM | POA: Diagnosis not present

## 2021-12-13 DIAGNOSIS — I130 Hypertensive heart and chronic kidney disease with heart failure and stage 1 through stage 4 chronic kidney disease, or unspecified chronic kidney disease: Secondary | ICD-10-CM | POA: Diagnosis not present

## 2021-12-13 DIAGNOSIS — N183 Chronic kidney disease, stage 3 unspecified: Secondary | ICD-10-CM | POA: Diagnosis not present

## 2021-12-13 DIAGNOSIS — C670 Malignant neoplasm of trigone of bladder: Secondary | ICD-10-CM | POA: Diagnosis not present

## 2021-12-13 DIAGNOSIS — J9611 Chronic respiratory failure with hypoxia: Secondary | ICD-10-CM | POA: Diagnosis not present

## 2021-12-15 DIAGNOSIS — J9611 Chronic respiratory failure with hypoxia: Secondary | ICD-10-CM | POA: Diagnosis not present

## 2021-12-15 DIAGNOSIS — L89152 Pressure ulcer of sacral region, stage 2: Secondary | ICD-10-CM | POA: Diagnosis not present

## 2021-12-15 DIAGNOSIS — I5032 Chronic diastolic (congestive) heart failure: Secondary | ICD-10-CM | POA: Diagnosis not present

## 2021-12-15 DIAGNOSIS — C670 Malignant neoplasm of trigone of bladder: Secondary | ICD-10-CM | POA: Diagnosis not present

## 2021-12-15 DIAGNOSIS — I130 Hypertensive heart and chronic kidney disease with heart failure and stage 1 through stage 4 chronic kidney disease, or unspecified chronic kidney disease: Secondary | ICD-10-CM | POA: Diagnosis not present

## 2021-12-15 DIAGNOSIS — N183 Chronic kidney disease, stage 3 unspecified: Secondary | ICD-10-CM | POA: Diagnosis not present

## 2021-12-17 DIAGNOSIS — C670 Malignant neoplasm of trigone of bladder: Secondary | ICD-10-CM | POA: Diagnosis not present

## 2021-12-17 DIAGNOSIS — N183 Chronic kidney disease, stage 3 unspecified: Secondary | ICD-10-CM | POA: Diagnosis not present

## 2021-12-17 DIAGNOSIS — I5032 Chronic diastolic (congestive) heart failure: Secondary | ICD-10-CM | POA: Diagnosis not present

## 2021-12-17 DIAGNOSIS — I130 Hypertensive heart and chronic kidney disease with heart failure and stage 1 through stage 4 chronic kidney disease, or unspecified chronic kidney disease: Secondary | ICD-10-CM | POA: Diagnosis not present

## 2021-12-17 DIAGNOSIS — J9611 Chronic respiratory failure with hypoxia: Secondary | ICD-10-CM | POA: Diagnosis not present

## 2021-12-17 DIAGNOSIS — L89152 Pressure ulcer of sacral region, stage 2: Secondary | ICD-10-CM | POA: Diagnosis not present

## 2021-12-20 DIAGNOSIS — J9611 Chronic respiratory failure with hypoxia: Secondary | ICD-10-CM | POA: Diagnosis not present

## 2021-12-20 DIAGNOSIS — I130 Hypertensive heart and chronic kidney disease with heart failure and stage 1 through stage 4 chronic kidney disease, or unspecified chronic kidney disease: Secondary | ICD-10-CM | POA: Diagnosis not present

## 2021-12-20 DIAGNOSIS — N183 Chronic kidney disease, stage 3 unspecified: Secondary | ICD-10-CM | POA: Diagnosis not present

## 2021-12-20 DIAGNOSIS — C670 Malignant neoplasm of trigone of bladder: Secondary | ICD-10-CM | POA: Diagnosis not present

## 2021-12-20 DIAGNOSIS — I5032 Chronic diastolic (congestive) heart failure: Secondary | ICD-10-CM | POA: Diagnosis not present

## 2021-12-20 DIAGNOSIS — L89152 Pressure ulcer of sacral region, stage 2: Secondary | ICD-10-CM | POA: Diagnosis not present

## 2021-12-22 DIAGNOSIS — C670 Malignant neoplasm of trigone of bladder: Secondary | ICD-10-CM | POA: Diagnosis not present

## 2021-12-22 DIAGNOSIS — I130 Hypertensive heart and chronic kidney disease with heart failure and stage 1 through stage 4 chronic kidney disease, or unspecified chronic kidney disease: Secondary | ICD-10-CM | POA: Diagnosis not present

## 2021-12-22 DIAGNOSIS — J9611 Chronic respiratory failure with hypoxia: Secondary | ICD-10-CM | POA: Diagnosis not present

## 2021-12-22 DIAGNOSIS — I5032 Chronic diastolic (congestive) heart failure: Secondary | ICD-10-CM | POA: Diagnosis not present

## 2021-12-22 DIAGNOSIS — L89152 Pressure ulcer of sacral region, stage 2: Secondary | ICD-10-CM | POA: Diagnosis not present

## 2021-12-22 DIAGNOSIS — N183 Chronic kidney disease, stage 3 unspecified: Secondary | ICD-10-CM | POA: Diagnosis not present

## 2021-12-23 DIAGNOSIS — C670 Malignant neoplasm of trigone of bladder: Secondary | ICD-10-CM | POA: Diagnosis not present

## 2021-12-23 DIAGNOSIS — I5032 Chronic diastolic (congestive) heart failure: Secondary | ICD-10-CM | POA: Diagnosis not present

## 2021-12-23 DIAGNOSIS — J9611 Chronic respiratory failure with hypoxia: Secondary | ICD-10-CM | POA: Diagnosis not present

## 2021-12-23 DIAGNOSIS — N183 Chronic kidney disease, stage 3 unspecified: Secondary | ICD-10-CM | POA: Diagnosis not present

## 2021-12-23 DIAGNOSIS — L89152 Pressure ulcer of sacral region, stage 2: Secondary | ICD-10-CM | POA: Diagnosis not present

## 2021-12-23 DIAGNOSIS — I130 Hypertensive heart and chronic kidney disease with heart failure and stage 1 through stage 4 chronic kidney disease, or unspecified chronic kidney disease: Secondary | ICD-10-CM | POA: Diagnosis not present

## 2021-12-24 DIAGNOSIS — I5032 Chronic diastolic (congestive) heart failure: Secondary | ICD-10-CM | POA: Diagnosis not present

## 2021-12-24 DIAGNOSIS — C670 Malignant neoplasm of trigone of bladder: Secondary | ICD-10-CM | POA: Diagnosis not present

## 2021-12-24 DIAGNOSIS — N183 Chronic kidney disease, stage 3 unspecified: Secondary | ICD-10-CM | POA: Diagnosis not present

## 2021-12-24 DIAGNOSIS — L89152 Pressure ulcer of sacral region, stage 2: Secondary | ICD-10-CM | POA: Diagnosis not present

## 2021-12-24 DIAGNOSIS — I130 Hypertensive heart and chronic kidney disease with heart failure and stage 1 through stage 4 chronic kidney disease, or unspecified chronic kidney disease: Secondary | ICD-10-CM | POA: Diagnosis not present

## 2021-12-24 DIAGNOSIS — J9611 Chronic respiratory failure with hypoxia: Secondary | ICD-10-CM | POA: Diagnosis not present

## 2021-12-27 ENCOUNTER — Other Ambulatory Visit: Payer: Self-pay | Admitting: *Deleted

## 2021-12-27 ENCOUNTER — Encounter: Payer: Self-pay | Admitting: *Deleted

## 2021-12-27 DIAGNOSIS — N183 Chronic kidney disease, stage 3 unspecified: Secondary | ICD-10-CM | POA: Diagnosis not present

## 2021-12-27 DIAGNOSIS — C670 Malignant neoplasm of trigone of bladder: Secondary | ICD-10-CM | POA: Diagnosis not present

## 2021-12-27 DIAGNOSIS — L89152 Pressure ulcer of sacral region, stage 2: Secondary | ICD-10-CM | POA: Diagnosis not present

## 2021-12-27 DIAGNOSIS — I130 Hypertensive heart and chronic kidney disease with heart failure and stage 1 through stage 4 chronic kidney disease, or unspecified chronic kidney disease: Secondary | ICD-10-CM | POA: Diagnosis not present

## 2021-12-27 DIAGNOSIS — J9611 Chronic respiratory failure with hypoxia: Secondary | ICD-10-CM | POA: Diagnosis not present

## 2021-12-27 DIAGNOSIS — I5032 Chronic diastolic (congestive) heart failure: Secondary | ICD-10-CM | POA: Diagnosis not present

## 2021-12-27 NOTE — Patient Outreach (Signed)
?[facility]) Care Management ?Telephonic RN Care Manager Note ? ? ?12/27/2021 ?Name:  Patrick Bishop MRN:  [id number] DOB:  03-20-1941 ? ?Summary: ?Reviewed plan of care and continue to encourage adherence utilizing the available resources via [facility] if needed.  Pt continue to HHPT/OT/RN and no longer has ongoing antibiotics via PICC discontinued. ? ?Recommendations/Changes made from today's visit: ?Will continue to follow up monthly and communicate with pt's provider and sent barrier's letter today for quarterly updates on pt's participation with the plan of care. No acute needs to addition at this time. Will follow up next month with ongoing case management services. ? ?Subjective: ?Patrick Bishop is an 81 y.o. year old male who is a primary patient of [name], [name], MD. The care management team was consulted for assistance with care management and/or care coordination needs.   ? ?Telephonic RN Care Manager completed Telephone Visit today. ? ?Objective:  ? ?Medications Reviewed Today   ? ? Reviewed by Adelfa Koh, CMA (Certified Medical Assistant) on 12/03/21 at [phone]  Med List Status: <None>  ? ?Medication Order Taking? Sig Documenting Provider Last Dose Status Informant  ?acetaminophen (TYLENOL) 325 MG tablet [id number] No Take 2 tablets (650 mg total) by mouth every 6 (six) hours as needed for mild pain (or Fever >/= 101).  ?Patient not taking: Reported on 11/02/2021  ? Danford, Suann Larry, MD Not Taking Active Multiple Informants  ?atorvastatin (LIPITOR) 10 MG tablet [id number] Yes TAKE 1 TABLET DAILY  ?Patient taking differently: Take 10 mg by mouth daily.  ? [name], MD Taking Active Multiple Informants  ?docusate sodium (COLACE) 100 MG capsule [id number] No Take 1 capsule (100 mg total) by mouth 2 (two) times daily.  ?Patient not taking: Reported on 11/02/2021  ? [name], MD Not Taking Active Multiple Informants  ?ertapenem [facility]) IVPB [id number] Yes Inject 1 g into the  vein daily for 14 days. Indication:  ESBL E coli w/stents ?First Dose: Yes ?Last Day of Therapy:  12/06/21 ?Labs - Once weekly:  CBC/D and BMP, ?Labs - Every other week:  ESR and CRP ?Method of administration: Mini-Bag Plus / Gravity ?Method of administration may be changed at the discretion of home infusion pharmacist based upon assessment of the patient and/or caregiver's ability to self-administer the medication ordered. [name], MD Taking Active   ?feeding supplement (ENSURE ENLIVE / ENSURE PLUS) LIQD [id number] Yes Take 237 mLs by mouth 3 (three) times daily between meals.  ?Patient taking differently: Take 237 mLs by mouth daily.  ? Danford, Suann Larry, MD Taking Active Multiple Informants  ?         ?Med Note [name], [name]   Fri Nov 19, 2021  1:28 PM) Non-compliant  ?HYDROcodone-acetaminophen (NORCO/VICODIN) 5-325 MG tablet [id number] Yes Take 1 tablet by mouth every 6 (six) hours as needed for moderate pain. [name], MD Taking Active Multiple Informants  ?loperamide (IMODIUM A-D) 2 MG tablet [id number] Yes Take 2-4 mg by mouth 4 (four) times daily as needed for diarrhea or loose stools. [provider] Taking Active Multiple Informants  ?metoprolol tartrate (LOPRESSOR) 25 MG tablet [id number] Yes Take 25-37.5 mg by mouth See admin instructions. Take 25 mg by mouth in the morning then 37.5 (1.5 tablets) in the evening [provider] Taking Active Multiple Informants  ?Nystatin (GERHARDT'S BUTT CREAM) CREA [id number] No Apply 1 application topically 3 (three) times daily.  ?Patient not taking: Reported on 11/02/2021  ? [name], MD Not Taking  Active Multiple Informants  ?pantoprazole (PROTONIX) 40 MG tablet [id number] No Take 1 tablet (40 mg total) by mouth 2 (two) times daily.  ?Patient not taking: Reported on 11/02/2021  ? [name], MD Not Taking Active Multiple Informants  ?potassium chloride SA (KLOR-CON) 20 MEQ tablet [id number] No Take 2 tablets (40  mEq total) by mouth daily.  ?Patient not taking: Reported on 12/03/2021  ? [name], MD Not Taking Active Multiple Informants  ?prochlorperazine (COMPAZINE) 10 MG tablet [id number] No TAKE 1 TABLET(10 MG) BY MOUTH EVERY 6 HOURS AS NEEDED FOR NAUSEA OR VOMITING  ?Patient not taking: Reported on 11/02/2021  ? [name], MD Not Taking Active Multiple Informants  ?tamsulosin (FLOMAX) 0.4 MG CAPS capsule [id number] Yes Take 0.4 mg by mouth daily. [provider] Taking Active Multiple Informants  ?torsemide (DEMADEX) 20 MG tablet [id number] Yes Take 1 tablet (20 mg total) by mouth 2 (two) times daily. Danford, Suann Larry, MD Taking Active Multiple Informants  ?XARELTO 20 MG TABS tablet [id number] Yes Take 1 tablet (20 mg total) by mouth daily with supper. [name], MD Taking Active Multiple Informants  ?         ?Med Note [name]   Tue Nov 02, 2021 10:07 AM)    ? ?  ?  ? ?  ? ? ? ?SDOH:  (Social Determinants of Health) assessments and interventions performed:  ?SDOH Interventions   ? ?Flowsheet Row Most Recent Value  ?SDOH Interventions   ?Food Insecurity Interventions Intervention Not Indicated  ?Transportation Interventions Intervention Not Indicated  ? ?  ? ? ? ?Care Plan ? ?Review of patient past medical history, allergies, medications, health status, including review of consultants reports, laboratory and other test data, was performed as part of comprehensive evaluation for care management services.  ? ?Care Plan : RN Care Manager Plan of Care  ?Updates made by Tobi Bastos, RN since 12/27/2021 12:00 AM  ?  ? ?Problem: Knowledge Deficit related to Hypertension   ?Priority: High  ?  ? ?Long-Range Goal: Development of a Plan of Care for management of Hypertension   ?Start Date: 10/15/2021  ?Expected End Date: 03/25/2022  ?This Visit's Progress: On track  ?Recent Progress: On track  ?Priority: High  ?Note:   ?Current Barriers:  ?Knowledge Deficits related to plan of care for management of  HTN  ? ?RNCM Clinical Goal(s):  ?Patient will verbalize understanding of plan for management of HTN as evidenced by teach-back method of understanding ?take all medications exactly as prescribed and will call provider for medication related questions as evidenced by self report and chart notification  through collaboration with RN Care manager, provider, and care team.  ? ?Interventions: ?Inter-disciplinary care team collaboration (see longitudinal plan of care) ?Evaluation of current treatment plan related to  self management and patient's adherence to plan as established by provider ? ? ?Hypertension Interventions:  (Status:  Goal on track:  Yes.) Long Term Goal ?Last practice recorded BP readings:  ?BP Readings from Last 3 Encounters:  ?10/07/21 (!) 90/58  ?09/21/21 (!) 107/58  ?08/13/21 106/71  ?Most recent eGFR/CrCl:  ?Lab Results  ?Component Value Date  ? EGFR 27 (L) 07/22/2021  ?  No components found for: CRCL ? ?Evaluation of current treatment plan related to hypertension self management and patient's adherence to plan as established by provider ?Provided education to patient re: stroke prevention, s/s of heart attack and stroke ?Reviewed medications with patient and discussed importance of compliance ?Provided assistance  with obtaining home blood pressure monitor via [facility] services will mail this DME to pt's confirmed address; ?Discussed plans with patient for ongoing care management follow up and provided patient with direct contact information for care management team ?Advised patient, providing education and rationale, to monitor blood pressure daily and record, calling PCP for findings outside established parameters ?Reviewed scheduled/upcoming provider appointments including:  ?Discussed complications of poorly controlled blood pressure such as heart disease, stroke, circulatory complications, vision complications, kidney impairment, sexual dysfunction ?Screening for signs and symptoms of depression  related to chronic disease state  ?Assessed social determinant of health barriers ? ?Update 2/20-Spoke with pt and permitted to speak with the caregiver in the home Aldean Ast in the home. Reports pt is doi

## 2021-12-30 DIAGNOSIS — I130 Hypertensive heart and chronic kidney disease with heart failure and stage 1 through stage 4 chronic kidney disease, or unspecified chronic kidney disease: Secondary | ICD-10-CM | POA: Diagnosis not present

## 2021-12-30 DIAGNOSIS — I5032 Chronic diastolic (congestive) heart failure: Secondary | ICD-10-CM | POA: Diagnosis not present

## 2021-12-30 DIAGNOSIS — N183 Chronic kidney disease, stage 3 unspecified: Secondary | ICD-10-CM | POA: Diagnosis not present

## 2021-12-30 DIAGNOSIS — C670 Malignant neoplasm of trigone of bladder: Secondary | ICD-10-CM | POA: Diagnosis not present

## 2021-12-30 DIAGNOSIS — J9611 Chronic respiratory failure with hypoxia: Secondary | ICD-10-CM | POA: Diagnosis not present

## 2021-12-30 DIAGNOSIS — L89152 Pressure ulcer of sacral region, stage 2: Secondary | ICD-10-CM | POA: Diagnosis not present

## 2021-12-31 DIAGNOSIS — L89152 Pressure ulcer of sacral region, stage 2: Secondary | ICD-10-CM | POA: Diagnosis not present

## 2021-12-31 DIAGNOSIS — I130 Hypertensive heart and chronic kidney disease with heart failure and stage 1 through stage 4 chronic kidney disease, or unspecified chronic kidney disease: Secondary | ICD-10-CM | POA: Diagnosis not present

## 2021-12-31 DIAGNOSIS — I5032 Chronic diastolic (congestive) heart failure: Secondary | ICD-10-CM | POA: Diagnosis not present

## 2021-12-31 DIAGNOSIS — N183 Chronic kidney disease, stage 3 unspecified: Secondary | ICD-10-CM | POA: Diagnosis not present

## 2021-12-31 DIAGNOSIS — J9611 Chronic respiratory failure with hypoxia: Secondary | ICD-10-CM | POA: Diagnosis not present

## 2021-12-31 DIAGNOSIS — C670 Malignant neoplasm of trigone of bladder: Secondary | ICD-10-CM | POA: Diagnosis not present

## 2022-01-03 ENCOUNTER — Emergency Department (HOSPITAL_COMMUNITY)
Admission: EM | Admit: 2022-01-03 | Discharge: 2022-01-03 | Disposition: A | Discharge status: Home / Self Care | Payer: Medicare Other | Attending: Emergency Medicine | Admitting: Emergency Medicine

## 2022-01-03 ENCOUNTER — Emergency Department (HOSPITAL_COMMUNITY): Payer: Medicare Other

## 2022-01-03 ENCOUNTER — Other Ambulatory Visit: Payer: Self-pay

## 2022-01-03 DIAGNOSIS — N130 Hydronephrosis with ureteropelvic junction obstruction: Secondary | ICD-10-CM | POA: Diagnosis not present

## 2022-01-03 DIAGNOSIS — E876 Hypokalemia: Secondary | ICD-10-CM | POA: Insufficient documentation

## 2022-01-03 DIAGNOSIS — Z79899 Other long term (current) drug therapy: Secondary | ICD-10-CM | POA: Diagnosis not present

## 2022-01-03 DIAGNOSIS — R531 Weakness: Secondary | ICD-10-CM | POA: Diagnosis not present

## 2022-01-03 DIAGNOSIS — R5381 Other malaise: Secondary | ICD-10-CM | POA: Insufficient documentation

## 2022-01-03 DIAGNOSIS — R739 Hyperglycemia, unspecified: Secondary | ICD-10-CM | POA: Diagnosis not present

## 2022-01-03 DIAGNOSIS — A4151 Sepsis due to Escherichia coli [E. coli]: Secondary | ICD-10-CM | POA: Diagnosis not present

## 2022-01-03 DIAGNOSIS — I959 Hypotension, unspecified: Secondary | ICD-10-CM | POA: Diagnosis not present

## 2022-01-03 LAB — BASIC METABOLIC PANEL
Anion gap: 14 (ref 5–15)
BUN: 18 mg/dL (ref 8–23)
CO2: 25 mmol/L (ref 22–32)
Calcium: 8.3 mg/dL — ABNORMAL LOW (ref 8.9–10.3)
Chloride: 101 mmol/L (ref 98–111)
Creatinine, Ser: 2.03 mg/dL — ABNORMAL HIGH (ref 0.61–1.24)
GFR, Estimated: 32 mL/min — ABNORMAL LOW (ref >60)
Glucose, Bld: 181 mg/dL — ABNORMAL HIGH (ref 70–99)
Potassium: 2.9 mmol/L — ABNORMAL LOW (ref 3.5–5.1)
Sodium: 140 mmol/L (ref 135–145)

## 2022-01-03 LAB — CBC WITH DIFFERENTIAL/PLATELET
Abs Immature Granulocytes: 0.03 10*3/uL (ref 0.00–0.07)
Basophils Absolute: 0 10*3/uL (ref 0.0–0.1)
Basophils Relative: 0 %
Eosinophils Absolute: 0 10*3/uL (ref 0.0–0.5)
Eosinophils Relative: 0 %
HCT: 34 % — ABNORMAL LOW (ref 39.0–52.0)
Hemoglobin: 11 g/dL — ABNORMAL LOW (ref 13.0–17.0)
Immature Granulocytes: 1 %
Lymphocytes Relative: 5 %
Lymphs Abs: 0.3 10*3/uL — ABNORMAL LOW (ref 0.7–4.0)
MCH: 30.9 pg (ref 26.0–34.0)
MCHC: 32.4 g/dL (ref 30.0–36.0)
MCV: 95.5 fL (ref 80.0–100.0)
Monocytes Absolute: 0.5 10*3/uL (ref 0.1–1.0)
Monocytes Relative: 7 %
Neutro Abs: 5.4 10*3/uL (ref 1.7–7.7)
Neutrophils Relative %: 87 %
Platelets: 195 10*3/uL (ref 150–400)
RBC: 3.56 MIL/uL — ABNORMAL LOW (ref 4.22–5.81)
RDW: 14.9 % (ref 11.5–15.5)
WBC: 6.3 10*3/uL (ref 4.0–10.5)
nRBC: 0 % (ref 0.0–0.2)

## 2022-01-03 MED ORDER — SODIUM CHLORIDE 0.9 % IV BOLUS
500.0000 mL | Freq: Once | INTRAVENOUS | Status: AC
  Administered 2022-01-03: 500 mL via INTRAVENOUS

## 2022-01-03 NOTE — ED Provider Notes (Signed)
?[place] DEPT ?Provider Note ? ? ?CSN: [id number] ?Arrival date & time: 01/03/22  1236 ? ?  ? ?History ? ?Chief Complaint  ?Patient presents with  ? Hypotension  ? ? ?Patrick Bishop is a 81 y.o. male. ? ?HPI ?Patient presents here from his urology office, where he went for a follow-up appointment on prior urinary tract problems.  Patient is unsure exactly what those were.  Apparently they tried to get his blood pressure and could not so based on that they sent him here for evaluation.  He is currently alone.  He states he and his roommate, came here by [facility]; she has since departed.  He denies cough, chills, vomiting, dysuria, focal weakness or paresthesia.  He feels generally weak.  He states the last 2 years have been hard on him. ?  ? ?Home Medications ?Prior to Admission medications   ?Medication Sig Start Date End Date Taking? Authorizing Provider  ?acetaminophen (TYLENOL) 325 MG tablet Take 2 tablets (650 mg total) by mouth every 6 (six) hours as needed for mild pain (or Fever >/= 101). ?Patient not taking: Reported on 11/02/2021 09/21/21   [name], MD  ?atorvastatin (LIPITOR) 10 MG tablet TAKE 1 TABLET DAILY ?Patient taking differently: Take 10 mg by mouth daily. 10/25/21   [name], MD  ?docusate sodium (COLACE) 100 MG capsule Take 1 capsule (100 mg total) by mouth 2 (two) times daily. ?Patient not taking: Reported on 11/02/2021 08/13/21   [name], MD  ?feeding supplement (ENSURE ENLIVE / ENSURE PLUS) LIQD Take 237 mLs by mouth 3 (three) times daily between meals. ?Patient taking differently: Take 237 mLs by mouth daily. 09/21/21   Danford, Suann Larry, MD  ?HYDROcodone-acetaminophen (NORCO/VICODIN) 5-325 MG tablet Take 1 tablet by mouth every 6 (six) hours as needed for moderate pain. 09/21/21   Danford, Suann Larry, MD  ?loperamide (IMODIUM A-D) 2 MG tablet Take 2-4 mg by mouth 4 (four) times daily as needed for diarrhea or loose stools.     [provider]  ?metoprolol tartrate (LOPRESSOR) 25 MG tablet Take 25-37.5 mg by mouth See admin instructions. Take 25 mg by mouth in the morning then 37.5 (1.5 tablets) in the evening 01/08/21   [provider]  ?Nystatin (GERHARDT'S BUTT CREAM) CREA Apply 1 application topically 3 (three) times daily. ?Patient not taking: Reported on 11/02/2021 08/13/21   [name], MD  ?pantoprazole (PROTONIX) 40 MG tablet Take 1 tablet (40 mg total) by mouth 2 (two) times daily. ?Patient not taking: Reported on 11/02/2021 08/13/21 08/13/22  [name], MD  ?potassium chloride SA (KLOR-CON) 20 MEQ tablet Take 2 tablets (40 mEq total) by mouth daily. ?Patient not taking: Reported on 12/03/2021 08/13/21   [name], MD  ?prochlorperazine (COMPAZINE) 10 MG tablet TAKE 1 TABLET(10 MG) BY MOUTH EVERY 6 HOURS AS NEEDED FOR NAUSEA OR VOMITING ?Patient not taking: Reported on 11/02/2021 12/14/20   [name], MD  ?tamsulosin (FLOMAX) 0.4 MG CAPS capsule Take 0.4 mg by mouth daily.    [provider]  ?torsemide (DEMADEX) 20 MG tablet Take 1 tablet (20 mg total) by mouth 2 (two) times daily. 09/21/21   Danford, Suann Larry, MD  ?Alveda Reasons 20 MG TABS tablet Take 1 tablet (20 mg total) by mouth daily with supper. 08/19/21   [name], MD  ?   ? ?Allergies    ?Aldactone [spironolactone]   ? ?Review of Systems   ?Review of Systems ? ?Physical Exam ?Updated  Vital Signs ?BP 100/63 (BP Location: Right Arm)   Pulse 67   Temp 97.8 ?F (36.6 ?C) (Oral)   Resp 14   Ht '6\' 1"'$  (1.854 m)   Wt 108.9 kg   SpO2 95%   BMI 31.67 kg/m?  ?Physical Exam ?Vitals and nursing note reviewed.  ?Constitutional:   ?   General: He is not in acute distress. ?   Appearance: He is well-developed. He is not ill-appearing or diaphoretic.  ?HENT:  ?   Head: Normocephalic and atraumatic.  ?   Right Ear: External ear normal.  ?   Left Ear: External ear normal.  ?   Nose: No congestion or rhinorrhea.  ?   Mouth/Throat:  ?   Pharynx:  No oropharyngeal exudate or posterior oropharyngeal erythema.  ?Eyes:  ?   Conjunctiva/sclera: Conjunctivae normal.  ?   Pupils: Pupils are equal, round, and reactive to light.  ?Neck:  ?   Trachea: Phonation normal.  ?Cardiovascular:  ?   Rate and Rhythm: Normal rate and regular rhythm.  ?   Heart sounds: Normal heart sounds.  ?Pulmonary:  ?   Effort: Pulmonary effort is normal.  ?   Breath sounds: Normal breath sounds.  ?Abdominal:  ?   General: There is no distension.  ?   Palpations: Abdomen is soft.  ?   Tenderness: There is no abdominal tenderness.  ?Musculoskeletal:     ?   General: Normal range of motion.  ?   Cervical back: Normal range of motion and neck supple.  ?   Comments: Normal strength bilaterally.  ?Skin: ?   General: Skin is warm and dry.  ?Neurological:  ?   Mental Status: He is alert and oriented to person, place, and time.  ?   Cranial Nerves: No cranial nerve deficit.  ?   Sensory: No sensory deficit.  ?   Motor: No abnormal muscle tone.  ?   Coordination: Coordination normal.  ?Psychiatric:     ?   Mood and Affect: Mood normal.     ?   Behavior: Behavior normal.     ?   Thought Content: Thought content normal.     ?   Judgment: Judgment normal.  ? ? ?ED Results / Procedures / Treatments   ?Labs ?(all labs ordered are listed, but only abnormal results are displayed) ?Labs Reviewed  ?BASIC METABOLIC PANEL - Abnormal; Notable for the following components:  ?    Result Value  ? Potassium 2.9 (*)   ? Glucose, Bld 181 (*)   ? Creatinine, Ser 2.03 (*)   ? Calcium 8.3 (*)   ? GFR, Estimated 32 (*)   ? All other components within normal limits  ?CBC WITH DIFFERENTIAL/PLATELET - Abnormal; Notable for the following components:  ? RBC 3.56 (*)   ? Hemoglobin 11.0 (*)   ? HCT 34.0 (*)   ? Lymphs Abs 0.3 (*)   ? All other components within normal limits  ?URINALYSIS, ROUTINE W REFLEX MICROSCOPIC  ? ? ?EKG ?None ? ?Radiology ?DG Chest 2 View ? ?Result Date: 01/03/2022 ?CLINICAL DATA:  Weakness. EXAM: CHEST -  2 VIEW COMPARISON:  November 22, 2021. FINDINGS: Stable cardiomediastinal silhouette. Both lungs are clear. The visualized skeletal structures are unremarkable. IMPRESSION: No active cardiopulmonary disease. Electronically Signed   By: Marijo Conception M.D.   On: 01/03/2022 14:53   ? ?Procedures ?Procedures  ? ? ?Medications Ordered in ED ?Medications  ?sodium chloride 0.9 % bolus 500  mL (0 mLs Intravenous Stopped 01/03/22 1700)  ? ? ?ED Course/ Medical Decision Making/ A&P ?  ?                        ?Medical Decision Making ?Patient presenting with nonspecific symptoms and difficult to assess blood pressure from his urology office.  No recent illnesses or significant abnormal findings.  He is a debilitated overweight elderly male, who could have occult illness including metabolic disorders and infection.  Will screen with chest x-ray, urinalysis, and labs. ? ?Amount and/or Complexity of Data Reviewed ?Independent Historian:  ?   Details: He is a cogent historian ?Labs: ordered. ?   Details: CBC, metabolic panel, normal except potassium low, and creatinine high and glucose high and calcium low ?Radiology: ordered and independent interpretation performed. ?   Details: Chest x-ray-no infiltrate or edema ? ?Risk ?Prescription drug management. ?Decision regarding hospitalization. ?Risk Details: Patient presenting after he went to a urology appointment, and they could not get his blood pressure.  He did not have syncope, headache or weakness.  On arrival to the ED his blood pressure was normal.  He was evaluated and found to be stable.  He was given some fluids.  Screening evaluation indicates hypokalemia.  Patient is currently taking a potassium prescription, twice a day according to his partner [name].  I have instructed them verbally to increase the potassium to 4 pills a day, for 5 days then have the potassium level rechecked when he sees his doctor next week.  The patient's creatinine is elevated but stable.  The  patient does not require further ED treatment or hospitalization. ? ? ? ? ? ? ? ? ? ? ?Final Clinical Impression(s) / ED Diagnoses ?Final diagnoses:  ?Malaise  ?Hypokalemia  ? ? ?Rx / DC Orders ?ED Discharge Orders

## 2022-01-03 NOTE — Discharge Instructions (Signed)
Your blood pressure, and laboratory testing are normal today.  Call your primary care doctor for follow-up appointment in a week or so.  Make sure you are getting plenty of rest, eating 3 meals a day and drinking plenty of fluids.  Return here as needed for problems. ?

## 2022-01-03 NOTE — ED Notes (Signed)
Patient transported to X-ray 

## 2022-01-03 NOTE — ED Triage Notes (Signed)
Ems brings pt in from urology office for follow up. Pt was hypotensive. Family reports decreased po intake over the past week. ?

## 2022-01-04 DIAGNOSIS — I130 Hypertensive heart and chronic kidney disease with heart failure and stage 1 through stage 4 chronic kidney disease, or unspecified chronic kidney disease: Secondary | ICD-10-CM | POA: Diagnosis not present

## 2022-01-04 DIAGNOSIS — L89152 Pressure ulcer of sacral region, stage 2: Secondary | ICD-10-CM | POA: Diagnosis not present

## 2022-01-04 DIAGNOSIS — C670 Malignant neoplasm of trigone of bladder: Secondary | ICD-10-CM | POA: Diagnosis not present

## 2022-01-04 DIAGNOSIS — J9611 Chronic respiratory failure with hypoxia: Secondary | ICD-10-CM | POA: Diagnosis not present

## 2022-01-04 DIAGNOSIS — I5032 Chronic diastolic (congestive) heart failure: Secondary | ICD-10-CM | POA: Diagnosis not present

## 2022-01-04 DIAGNOSIS — N183 Chronic kidney disease, stage 3 unspecified: Secondary | ICD-10-CM | POA: Diagnosis not present

## 2022-01-05 DIAGNOSIS — I5032 Chronic diastolic (congestive) heart failure: Secondary | ICD-10-CM | POA: Diagnosis not present

## 2022-01-05 DIAGNOSIS — C670 Malignant neoplasm of trigone of bladder: Secondary | ICD-10-CM | POA: Diagnosis not present

## 2022-01-05 DIAGNOSIS — J9611 Chronic respiratory failure with hypoxia: Secondary | ICD-10-CM | POA: Diagnosis not present

## 2022-01-05 DIAGNOSIS — I130 Hypertensive heart and chronic kidney disease with heart failure and stage 1 through stage 4 chronic kidney disease, or unspecified chronic kidney disease: Secondary | ICD-10-CM | POA: Diagnosis not present

## 2022-01-05 DIAGNOSIS — L89152 Pressure ulcer of sacral region, stage 2: Secondary | ICD-10-CM | POA: Diagnosis not present

## 2022-01-05 DIAGNOSIS — N183 Chronic kidney disease, stage 3 unspecified: Secondary | ICD-10-CM | POA: Diagnosis not present

## 2022-01-09 DIAGNOSIS — L89152 Pressure ulcer of sacral region, stage 2: Secondary | ICD-10-CM | POA: Diagnosis not present

## 2022-01-09 DIAGNOSIS — G8929 Other chronic pain: Secondary | ICD-10-CM | POA: Diagnosis not present

## 2022-01-09 DIAGNOSIS — I5032 Chronic diastolic (congestive) heart failure: Secondary | ICD-10-CM | POA: Diagnosis not present

## 2022-01-09 DIAGNOSIS — I272 Pulmonary hypertension, unspecified: Secondary | ICD-10-CM | POA: Diagnosis not present

## 2022-01-09 DIAGNOSIS — Z960 Presence of urogenital implants: Secondary | ICD-10-CM | POA: Diagnosis not present

## 2022-01-09 DIAGNOSIS — I4821 Permanent atrial fibrillation: Secondary | ICD-10-CM | POA: Diagnosis not present

## 2022-01-09 DIAGNOSIS — Z993 Dependence on wheelchair: Secondary | ICD-10-CM | POA: Diagnosis not present

## 2022-01-09 DIAGNOSIS — I5022 Chronic systolic (congestive) heart failure: Secondary | ICD-10-CM | POA: Diagnosis not present

## 2022-01-09 DIAGNOSIS — Z6835 Body mass index (BMI) 35.0-35.9, adult: Secondary | ICD-10-CM | POA: Diagnosis not present

## 2022-01-09 DIAGNOSIS — I130 Hypertensive heart and chronic kidney disease with heart failure and stage 1 through stage 4 chronic kidney disease, or unspecified chronic kidney disease: Secondary | ICD-10-CM | POA: Diagnosis not present

## 2022-01-09 DIAGNOSIS — Z79891 Long term (current) use of opiate analgesic: Secondary | ICD-10-CM | POA: Diagnosis not present

## 2022-01-09 DIAGNOSIS — Z9181 History of falling: Secondary | ICD-10-CM | POA: Diagnosis not present

## 2022-01-09 DIAGNOSIS — N390 Urinary tract infection, site not specified: Secondary | ICD-10-CM | POA: Diagnosis not present

## 2022-01-09 DIAGNOSIS — E669 Obesity, unspecified: Secondary | ICD-10-CM | POA: Diagnosis not present

## 2022-01-09 DIAGNOSIS — Z4800 Encounter for change or removal of nonsurgical wound dressing: Secondary | ICD-10-CM | POA: Diagnosis not present

## 2022-01-09 DIAGNOSIS — M1990 Unspecified osteoarthritis, unspecified site: Secondary | ICD-10-CM | POA: Diagnosis not present

## 2022-01-09 DIAGNOSIS — I872 Venous insufficiency (chronic) (peripheral): Secondary | ICD-10-CM | POA: Diagnosis not present

## 2022-01-09 DIAGNOSIS — G4733 Obstructive sleep apnea (adult) (pediatric): Secondary | ICD-10-CM | POA: Diagnosis not present

## 2022-01-09 DIAGNOSIS — N183 Chronic kidney disease, stage 3 unspecified: Secondary | ICD-10-CM | POA: Diagnosis not present

## 2022-01-09 DIAGNOSIS — J9611 Chronic respiratory failure with hypoxia: Secondary | ICD-10-CM | POA: Diagnosis not present

## 2022-01-09 DIAGNOSIS — C670 Malignant neoplasm of trigone of bladder: Secondary | ICD-10-CM | POA: Diagnosis not present

## 2022-01-09 DIAGNOSIS — A4151 Sepsis due to Escherichia coli [E. coli]: Secondary | ICD-10-CM | POA: Diagnosis not present

## 2022-01-09 DIAGNOSIS — Z7901 Long term (current) use of anticoagulants: Secondary | ICD-10-CM | POA: Diagnosis not present

## 2022-01-10 DIAGNOSIS — J9611 Chronic respiratory failure with hypoxia: Secondary | ICD-10-CM | POA: Diagnosis not present

## 2022-01-10 DIAGNOSIS — I130 Hypertensive heart and chronic kidney disease with heart failure and stage 1 through stage 4 chronic kidney disease, or unspecified chronic kidney disease: Secondary | ICD-10-CM | POA: Diagnosis not present

## 2022-01-10 DIAGNOSIS — N183 Chronic kidney disease, stage 3 unspecified: Secondary | ICD-10-CM | POA: Diagnosis not present

## 2022-01-10 DIAGNOSIS — C670 Malignant neoplasm of trigone of bladder: Secondary | ICD-10-CM | POA: Diagnosis not present

## 2022-01-10 DIAGNOSIS — I5032 Chronic diastolic (congestive) heart failure: Secondary | ICD-10-CM | POA: Diagnosis not present

## 2022-01-10 DIAGNOSIS — L89152 Pressure ulcer of sacral region, stage 2: Secondary | ICD-10-CM | POA: Diagnosis not present

## 2022-01-11 DIAGNOSIS — J9611 Chronic respiratory failure with hypoxia: Secondary | ICD-10-CM | POA: Diagnosis not present

## 2022-01-11 DIAGNOSIS — N183 Chronic kidney disease, stage 3 unspecified: Secondary | ICD-10-CM | POA: Diagnosis not present

## 2022-01-11 DIAGNOSIS — L89152 Pressure ulcer of sacral region, stage 2: Secondary | ICD-10-CM | POA: Diagnosis not present

## 2022-01-11 DIAGNOSIS — I130 Hypertensive heart and chronic kidney disease with heart failure and stage 1 through stage 4 chronic kidney disease, or unspecified chronic kidney disease: Secondary | ICD-10-CM | POA: Diagnosis not present

## 2022-01-11 DIAGNOSIS — I5032 Chronic diastolic (congestive) heart failure: Secondary | ICD-10-CM | POA: Diagnosis not present

## 2022-01-11 DIAGNOSIS — C670 Malignant neoplasm of trigone of bladder: Secondary | ICD-10-CM | POA: Diagnosis not present

## 2022-01-12 DIAGNOSIS — L89152 Pressure ulcer of sacral region, stage 2: Secondary | ICD-10-CM | POA: Diagnosis not present

## 2022-01-12 DIAGNOSIS — J9611 Chronic respiratory failure with hypoxia: Secondary | ICD-10-CM | POA: Diagnosis not present

## 2022-01-12 DIAGNOSIS — C670 Malignant neoplasm of trigone of bladder: Secondary | ICD-10-CM | POA: Diagnosis not present

## 2022-01-12 DIAGNOSIS — I5032 Chronic diastolic (congestive) heart failure: Secondary | ICD-10-CM | POA: Diagnosis not present

## 2022-01-12 DIAGNOSIS — I130 Hypertensive heart and chronic kidney disease with heart failure and stage 1 through stage 4 chronic kidney disease, or unspecified chronic kidney disease: Secondary | ICD-10-CM | POA: Diagnosis not present

## 2022-01-12 DIAGNOSIS — N183 Chronic kidney disease, stage 3 unspecified: Secondary | ICD-10-CM | POA: Diagnosis not present

## 2022-01-13 DIAGNOSIS — N184 Chronic kidney disease, stage 4 (severe): Secondary | ICD-10-CM | POA: Diagnosis not present

## 2022-01-13 DIAGNOSIS — I5032 Chronic diastolic (congestive) heart failure: Secondary | ICD-10-CM | POA: Diagnosis not present

## 2022-01-13 DIAGNOSIS — I959 Hypotension, unspecified: Secondary | ICD-10-CM | POA: Diagnosis not present

## 2022-01-14 DIAGNOSIS — N183 Chronic kidney disease, stage 3 unspecified: Secondary | ICD-10-CM | POA: Diagnosis not present

## 2022-01-14 DIAGNOSIS — I130 Hypertensive heart and chronic kidney disease with heart failure and stage 1 through stage 4 chronic kidney disease, or unspecified chronic kidney disease: Secondary | ICD-10-CM | POA: Diagnosis not present

## 2022-01-14 DIAGNOSIS — I5032 Chronic diastolic (congestive) heart failure: Secondary | ICD-10-CM | POA: Diagnosis not present

## 2022-01-14 DIAGNOSIS — L89152 Pressure ulcer of sacral region, stage 2: Secondary | ICD-10-CM | POA: Diagnosis not present

## 2022-01-14 DIAGNOSIS — C670 Malignant neoplasm of trigone of bladder: Secondary | ICD-10-CM | POA: Diagnosis not present

## 2022-01-14 DIAGNOSIS — J9611 Chronic respiratory failure with hypoxia: Secondary | ICD-10-CM | POA: Diagnosis not present

## 2022-01-17 ENCOUNTER — Encounter: Payer: Self-pay | Admitting: Cardiology

## 2022-01-17 ENCOUNTER — Ambulatory Visit (INDEPENDENT_AMBULATORY_CARE_PROVIDER_SITE_OTHER): Payer: Medicare Other | Admitting: Cardiology

## 2022-01-17 VITALS — BP 81/52 | Ht 73.0 in | Wt 220.0 lb

## 2022-01-17 DIAGNOSIS — J9611 Chronic respiratory failure with hypoxia: Secondary | ICD-10-CM | POA: Diagnosis not present

## 2022-01-17 DIAGNOSIS — I5032 Chronic diastolic (congestive) heart failure: Secondary | ICD-10-CM

## 2022-01-17 DIAGNOSIS — I959 Hypotension, unspecified: Secondary | ICD-10-CM | POA: Diagnosis not present

## 2022-01-17 DIAGNOSIS — I952 Hypotension due to drugs: Secondary | ICD-10-CM | POA: Diagnosis not present

## 2022-01-17 DIAGNOSIS — I77811 Abdominal aortic ectasia: Secondary | ICD-10-CM | POA: Diagnosis not present

## 2022-01-17 DIAGNOSIS — N183 Chronic kidney disease, stage 3 unspecified: Secondary | ICD-10-CM | POA: Diagnosis not present

## 2022-01-17 DIAGNOSIS — I10 Essential (primary) hypertension: Secondary | ICD-10-CM | POA: Diagnosis not present

## 2022-01-17 DIAGNOSIS — I272 Pulmonary hypertension, unspecified: Secondary | ICD-10-CM | POA: Diagnosis not present

## 2022-01-17 DIAGNOSIS — L89152 Pressure ulcer of sacral region, stage 2: Secondary | ICD-10-CM | POA: Diagnosis not present

## 2022-01-17 DIAGNOSIS — I4821 Permanent atrial fibrillation: Secondary | ICD-10-CM

## 2022-01-17 DIAGNOSIS — G4733 Obstructive sleep apnea (adult) (pediatric): Secondary | ICD-10-CM | POA: Diagnosis not present

## 2022-01-17 DIAGNOSIS — I871 Compression of vein: Secondary | ICD-10-CM | POA: Diagnosis not present

## 2022-01-17 DIAGNOSIS — N1832 Chronic kidney disease, stage 3b: Secondary | ICD-10-CM | POA: Diagnosis not present

## 2022-01-17 DIAGNOSIS — M7989 Other specified soft tissue disorders: Secondary | ICD-10-CM | POA: Diagnosis not present

## 2022-01-17 DIAGNOSIS — E785 Hyperlipidemia, unspecified: Secondary | ICD-10-CM

## 2022-01-17 DIAGNOSIS — I130 Hypertensive heart and chronic kidney disease with heart failure and stage 1 through stage 4 chronic kidney disease, or unspecified chronic kidney disease: Secondary | ICD-10-CM | POA: Diagnosis not present

## 2022-01-17 DIAGNOSIS — I872 Venous insufficiency (chronic) (peripheral): Secondary | ICD-10-CM

## 2022-01-17 DIAGNOSIS — C670 Malignant neoplasm of trigone of bladder: Secondary | ICD-10-CM | POA: Diagnosis not present

## 2022-01-17 DIAGNOSIS — Z9989 Dependence on other enabling machines and devices: Secondary | ICD-10-CM

## 2022-01-17 MED ORDER — TORSEMIDE 20 MG PO TABS: ORAL_TABLET | ORAL | Status: DC

## 2022-01-17 MED ORDER — MIDODRINE HCL 2.5 MG PO TABS: 2.5000 mg | ORAL_TABLET | Freq: Two times a day (BID) | ORAL | 2 refills | Status: DC

## 2022-01-17 NOTE — Patient Instructions (Addendum)
Medication Instructions:  ? ?Start Midodrine 2.5 mg  twice a day    ( for hypotension  ? ?Wean off Metoprolol 25 mg take 1/2 tablet  daily for 2 weeks then stop all together.  ? ?  Decrease Torsemide - take 20 mg  daily   use the other 20 mg as needed for swelling  shortness of breath  or weight gain of 3 lbs or more  until weight goes back to dry weight.  ? ?*If you need a refill on your cardiac medications before your next appointment, please call your pharmacy* ? ? ?Lab Work: ? ?Not needed ? ? ?Testing/Procedures: ? ? Not needed ? ?Follow-Up: ?At [facility], you and your health needs are our priority.  As part of our continuing mission to provide you with exceptional heart care, we have created designated Provider Care Teams.  These Care Teams include your primary Cardiologist (physician) and Advanced Practice Providers (APPs -  Physician Assistants and Nurse Practitioners) who all work together to provide you with the care you need, when you need it. ? ?  ? ?Your next appointment:   ?2  to 3 month(s) ? ?The format for your next appointment:   ?In Person ? ?Provider:   ?Almyra Deforest, PA-C Then, Glenetta Hew, MD will plan to see you again in 6 month(s). ? ? ?Other Instructions  ? ?

## 2022-01-17 NOTE — Progress Notes (Signed)
? ? ?Primary Care Provider: Lujean Amel, MD ?Cardiologist: Glenetta Hew, MD ?Electrophysiologist: None ?Nephrologist: Gean Quint ?Oncologist: Dr. Alen Blew ?Urologist: Dr. Lovena Neighbours ? ?Clinic Note: ?Chief Complaint  ?Patient presents with  ?? Hospitalization Follow-up  ?  Multiple hospitalizations.  Has not been seen in over a year.  ?? Atrial Fibrillation  ?  No notable A-fib symptoms.  ?? Congestive Heart Failure  ?  Normal EF by echo.  Echo suggestive of RV volume overload.  Significant weight loss and planes of dizziness and lightheadedness.  All medications have been reduced.  ? ? ?=================================== ? ?ASSESSMENT/PLAN  ? ?Problem List Items Addressed This Visit   ? ?  ? Cardiology Problems  ? Hypotension (arterial) - Primary  ?  Combination of probably some mild systemic dehydration but also on diuretic and beta-blocker. ? ?Plan: Wean off metoprolol-he is only taking 25 mg daily, he will take half tablet daily for 2 weeks and then stop altogether.  We will also decrease torsemide to once daily using the second dose PRN only for worsening edema or weight gain. ? ?We will add midodrine 2.5 mg twice daily-may potentially titrate further if pressures remain low. ? ?We will see in close follow-up. ? ?  ?  ? Relevant Medications  ? midodrine (PROAMATINE) 2.5 MG tablet  ? torsemide (DEMADEX) 20 MG tablet  ? Other Relevant Orders  ? EKG 12-Lead (Completed)  ? Permanent atrial fibrillation (HCC); CHA2DS2-VASc score =5 (agex2, aortic plaque, CHF, HTN) (Chronic)  ?  Completely asymptomatic.  Rate pretty well controlled.  Not unexpectedly it probably is playing a role in his dyspnea, but there is a lot of other factors going on. ? ?He is on Xarelto which is always his creatinine is 2, is probably okay, but may need to adjust (will ask our clinical pharmacist to review) ? ?He is currently on very low-dose metoprolol, ominous have him simply wean off completely.  We may want to use diltiazem short acting  doses for rate control if he goes fast.  Diltiazem would have more benefit for pulmonary hypertension and less likely to cause hypotension. ? ?  ?  ? Relevant Medications  ? midodrine (PROAMATINE) 2.5 MG tablet  ? torsemide (DEMADEX) 20 MG tablet  ? Other Relevant Orders  ? EKG 12-Lead (Completed)  ? Chronic diastolic HF (heart failure) (HCC) (Chronic)  ?  I think has been was euvolemic based on his level of edema and no PND/orthopnea. ? ?With hypotension, no need for afterload reduction.  We will actually wean off beta-blocker. ? ?  ?  ? Relevant Medications  ? midodrine (PROAMATINE) 2.5 MG tablet  ? torsemide (DEMADEX) 20 MG tablet  ? Chronic venous insufficiency (Chronic)  ?  Actually no recurrent wounds.  His swelling is notably better.  Still has venous stasis changes but doing fine. ? ?Now that his legs are down, I do think that we can probably have him start wearing support stockings, but is very difficult for him to put them on.  Perhaps Ace wraps or even Unaboot would be a good option. ? ?He is currently taking torsemide twice daily but I think his PCP reduced it down.  No longer on metolazone. ? ?I will have him reduce it down to once daily and take an additional dose as needed. ? ?  ?  ? Relevant Medications  ? midodrine (PROAMATINE) 2.5 MG tablet  ? torsemide (DEMADEX) 20 MG tablet  ? Essential hypertension (Chronic)  ?  Clearly no longer  hypertensive.  He is very much hypotensive. ? ?We offered a beta-blocker and continue to reduce his torsemide dose now to daily. ? ?  ?  ? Relevant Medications  ? midodrine (PROAMATINE) 2.5 MG tablet  ? torsemide (DEMADEX) 20 MG tablet  ? Pulmonary hypertension (HCC) (Chronic)  ?  Clearly suggested on recent 2D echo.  Mostly because he has been out of the hospital quite a bit, I am reluctant to evaluate with a right heart cath anytime soon.  He has relatively stable with notable reduction in edema.  He is 20 pounds down.  Hypotensive. ?My actual plan is to wean down his  diuretic and beta-blocker and add midodrine. ? ?We can reassess him in a few months to see how he is doing.  If he has been stable with no UTI issues, would consider right heart cath simply to quantify his pulmonary hypertension. ? ?Stressed the importance of using CPAP. ? ?  ?  ? Relevant Medications  ? midodrine (PROAMATINE) 2.5 MG tablet  ? torsemide (DEMADEX) 20 MG tablet  ? Hyperlipidemia with target LDL less than 100 (Chronic)  ?  Lipids have not been checked since May 2022.  The LDL was 46 at that time.  I would imagine with all his illnesses and weight loss, should be better now.  He remains on 10 mg atorvastatin. ?He tells me his PCP recently checked his blood work.  If not we will have it checked here in follow-up.  Should be due in May. ? ?  ?  ? Relevant Medications  ? midodrine (PROAMATINE) 2.5 MG tablet  ? torsemide (DEMADEX) 20 MG tablet  ? 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  History of venous stasis with bilateral iliac vein stenting.  Based on most recent CT of the chest abdomen pelvis, venous stents appear to be patent. ? ?  ?  ? Relevant Medications  ? midodrine (PROAMATINE) 2.5 MG tablet  ? torsemide (DEMADEX) 20 MG tablet  ? Ectatic abdominal aorta (HCC) (Chronic)  ?  Abdominal aortic atherosclerotic disease, this was accounted for in the CHA2DS2-VASc score..   ? 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  Follow-up evaluation in 2 years. ? ?Continue risk factor modification with statin.  Blood pressure is actually well controlled.  Lipids have also been well controlled. ? ?  ?  ? Relevant Medications  ? midodrine (PROAMATINE) 2.5 MG tablet  ? torsemide (DEMADEX) 20 MG tablet  ?  ? Other  ? Chronic kidney disease, stage III (moderate) (HCC) (Chronic)  ?  Most recent creatinine is 2.0. ?Certainly this is not helped by his hypotension.  We will add midodrine to help increase systolic blood pressure, wean off beta-blocker, reduced  torsemide. ? ?Actually would encourage hydration, and nutrition.. ? ?  ?  ? OSA on CPAP (Chronic)  ?  He has not been using his CPAP very well.  I encouraged him to please try going back on injectable definitely help his pulm hypertension. ? ?  ?  ? ?Other Visit Diagnoses   ? ? Swelling of both lower extremities      ? Relevant Orders  ? EKG 12-Lead (Completed)  ? ?  ? ? ?Problem List Items Addressed This Visit   ?  Permanent atrial fibrillation (HCC); CHA2DS2-VASc score =5 (agex2, aortic plaque, CHF, HTN) - Primary (Chronic)  ?    I think he still is on metoprolol given his  heart rate 55 bpm.  Is not listed, but I do not see where this was discontinued.  Continue current dosing at 25 mg morning and 37.5 mg p.m. ?Is on Xarelto for anticoagulation.  No bleeding issues. ?  ?Okay to hold Xarelto for any surgery. ?-> Standard will be to hold 2 days preop for most of his procedures or surgeries, 3 days for more high risk (bleeding) procedures ?   ?  ?  Relevant Orders  ?  EKG 12-Lead (Completed)  ?  Morbid obesity ([place]) (Chronic)  ?    Difficult for him to exercise.  Discussed importance of dietary modification.  In addition to avoiding excess salt in his diet, should also simply cut down calorie intake. ?   ?  ?  Chronic diastolic HF (heart failure) (HCC) (Chronic)  ?    Stable orthopnea more related to obesity.  Needs to use CPAP. ?  ?Most exacerbated if he is in A. fib RVR which has not occurred in a long time.  Diuretics per nephrology. ?   ?  ?  Relevant Orders  ?  EKG 12-Lead (Completed)  ?  Chronic venous insufficiency (Chronic)  ?    Chronic anemia, stable.  Discussed use of Ace wraps, foot elevation and possibly support stockings.  Wonder if he would benefit from pneumatic sequential compression devices. ?  ?Diuretics have been managed by nephrology. ?   ?  ?  Essential hypertension (Chronic)  ?    Has had some hypotension issues in the past we will see the amlodipine dose was discontinued.  Need to confirm if  he is on beta-blocker or not.  Not listed on his current list.  But heart rate would indicate that he is taking beta-blocker. ?   ?  ?  OSA on CPAP (Chronic)  ?    Using COVID since his pneumonia back in [place]

## 2022-01-18 ENCOUNTER — Telehealth: Payer: Self-pay | Admitting: Cardiology

## 2022-01-18 DIAGNOSIS — I130 Hypertensive heart and chronic kidney disease with heart failure and stage 1 through stage 4 chronic kidney disease, or unspecified chronic kidney disease: Secondary | ICD-10-CM | POA: Diagnosis not present

## 2022-01-18 DIAGNOSIS — C670 Malignant neoplasm of trigone of bladder: Secondary | ICD-10-CM | POA: Diagnosis not present

## 2022-01-18 DIAGNOSIS — N183 Chronic kidney disease, stage 3 unspecified: Secondary | ICD-10-CM | POA: Diagnosis not present

## 2022-01-18 DIAGNOSIS — J9611 Chronic respiratory failure with hypoxia: Secondary | ICD-10-CM | POA: Diagnosis not present

## 2022-01-18 DIAGNOSIS — L89152 Pressure ulcer of sacral region, stage 2: Secondary | ICD-10-CM | POA: Diagnosis not present

## 2022-01-18 DIAGNOSIS — I5032 Chronic diastolic (congestive) heart failure: Secondary | ICD-10-CM | POA: Diagnosis not present

## 2022-01-18 NOTE — Telephone Encounter (Signed)
Spoke to patient he requested to review his medications.Stated he wanted to make he was taking correct medication.Medications reviewed.He voiced understanding. ?

## 2022-01-18 NOTE — Telephone Encounter (Signed)
Patient is calling confused about what medications he should and should not be on based on his current medication list due to not having some that are listed. He is requesting callback to discuss.  ?

## 2022-01-19 DIAGNOSIS — L89152 Pressure ulcer of sacral region, stage 2: Secondary | ICD-10-CM | POA: Diagnosis not present

## 2022-01-19 DIAGNOSIS — J9611 Chronic respiratory failure with hypoxia: Secondary | ICD-10-CM | POA: Diagnosis not present

## 2022-01-19 DIAGNOSIS — C670 Malignant neoplasm of trigone of bladder: Secondary | ICD-10-CM | POA: Diagnosis not present

## 2022-01-19 DIAGNOSIS — I130 Hypertensive heart and chronic kidney disease with heart failure and stage 1 through stage 4 chronic kidney disease, or unspecified chronic kidney disease: Secondary | ICD-10-CM | POA: Diagnosis not present

## 2022-01-19 DIAGNOSIS — N183 Chronic kidney disease, stage 3 unspecified: Secondary | ICD-10-CM | POA: Diagnosis not present

## 2022-01-19 DIAGNOSIS — I5032 Chronic diastolic (congestive) heart failure: Secondary | ICD-10-CM | POA: Diagnosis not present

## 2022-01-21 DIAGNOSIS — C670 Malignant neoplasm of trigone of bladder: Secondary | ICD-10-CM | POA: Diagnosis not present

## 2022-01-21 DIAGNOSIS — I5032 Chronic diastolic (congestive) heart failure: Secondary | ICD-10-CM | POA: Diagnosis not present

## 2022-01-21 DIAGNOSIS — N183 Chronic kidney disease, stage 3 unspecified: Secondary | ICD-10-CM | POA: Diagnosis not present

## 2022-01-21 DIAGNOSIS — L89152 Pressure ulcer of sacral region, stage 2: Secondary | ICD-10-CM | POA: Diagnosis not present

## 2022-01-21 DIAGNOSIS — J9611 Chronic respiratory failure with hypoxia: Secondary | ICD-10-CM | POA: Diagnosis not present

## 2022-01-21 DIAGNOSIS — I130 Hypertensive heart and chronic kidney disease with heart failure and stage 1 through stage 4 chronic kidney disease, or unspecified chronic kidney disease: Secondary | ICD-10-CM | POA: Diagnosis not present

## 2022-01-24 ENCOUNTER — Other Ambulatory Visit: Payer: Self-pay | Admitting: Cardiology

## 2022-01-24 DIAGNOSIS — C670 Malignant neoplasm of trigone of bladder: Secondary | ICD-10-CM | POA: Diagnosis not present

## 2022-01-24 DIAGNOSIS — I5032 Chronic diastolic (congestive) heart failure: Secondary | ICD-10-CM | POA: Diagnosis not present

## 2022-01-24 DIAGNOSIS — I130 Hypertensive heart and chronic kidney disease with heart failure and stage 1 through stage 4 chronic kidney disease, or unspecified chronic kidney disease: Secondary | ICD-10-CM | POA: Diagnosis not present

## 2022-01-24 DIAGNOSIS — L89152 Pressure ulcer of sacral region, stage 2: Secondary | ICD-10-CM | POA: Diagnosis not present

## 2022-01-24 DIAGNOSIS — N183 Chronic kidney disease, stage 3 unspecified: Secondary | ICD-10-CM | POA: Diagnosis not present

## 2022-01-24 DIAGNOSIS — J9611 Chronic respiratory failure with hypoxia: Secondary | ICD-10-CM | POA: Diagnosis not present

## 2022-01-25 DIAGNOSIS — L89152 Pressure ulcer of sacral region, stage 2: Secondary | ICD-10-CM | POA: Diagnosis not present

## 2022-01-25 DIAGNOSIS — C670 Malignant neoplasm of trigone of bladder: Secondary | ICD-10-CM | POA: Diagnosis not present

## 2022-01-25 DIAGNOSIS — N183 Chronic kidney disease, stage 3 unspecified: Secondary | ICD-10-CM | POA: Diagnosis not present

## 2022-01-25 DIAGNOSIS — I130 Hypertensive heart and chronic kidney disease with heart failure and stage 1 through stage 4 chronic kidney disease, or unspecified chronic kidney disease: Secondary | ICD-10-CM | POA: Diagnosis not present

## 2022-01-25 DIAGNOSIS — J9611 Chronic respiratory failure with hypoxia: Secondary | ICD-10-CM | POA: Diagnosis not present

## 2022-01-25 DIAGNOSIS — I5032 Chronic diastolic (congestive) heart failure: Secondary | ICD-10-CM | POA: Diagnosis not present

## 2022-01-26 ENCOUNTER — Other Ambulatory Visit: Payer: Self-pay | Admitting: *Deleted

## 2022-01-26 NOTE — Patient Outreach (Signed)
[facility]) Care Management Telephonic RN Care Manager Note   01/26/2022 Name:  Patrick Bishop MRN:  [id number] DOB:  11/20/1940  Summary: Pt continues to well with recent intervention for his hypotension. CAD provided medications to increase BP resulting in systolic from 80 to now 110 with no acute symptoms of issues. Pt also going to the gyn qod with live-in caregiver Britta Mccreedy who provided the updated telephone assessment today. [facility] completed therapies however assisting pt with elevated toilet seat.  Recommendations/Changes made from today's visit: Will continue to encourage ongoing adherence with the plan of care discussed today and remind caregiver on the other disciplines available to assist with management of care issues (social workers and pharmacy).  Subjective: Patrick Bishop is an 81 y.o. year old male who is a primary patient of [name], [name], MD. The care management team was consulted for assistance with care management and/or care coordination needs.    Telephonic RN Care Manager completed Telephone Visit today.  Objective:   Medications Reviewed Today     Reviewed by Derenda Fennel, CMA (Certified Medical Assistant) on 01/17/22 at 1430  Med List Status: <None>   Medication Order Taking? Sig Documenting Provider Last Dose Status Informant  acetaminophen (TYLENOL) 325 MG tablet [id number] No Take 2 tablets (650 mg total) by mouth every 6 (six) hours as needed for mild pain (or Fever >/= 101).  Patient not taking: Reported on 11/02/2021   Alberteen Sam, MD Not Taking Consider Medication Status and Discontinue (Change in therapy) Multiple Informants  acetaminophen (TYLENOL) 325 MG tablet [id number] No Take 325 mg by mouth every 6 (six) hours as needed.  Patient not taking: Reported on 01/17/2022   [provider] Not Taking Active   atorvastatin (LIPITOR) 10 MG tablet [id number] Yes TAKE 1 TABLET DAILY  Patient taking differently: Take 10  mg by mouth daily.   Marykay Lex, MD Taking Active Multiple Informants  docusate sodium (COLACE) 100 MG capsule [id number] Yes Take 1 capsule (100 mg total) by mouth 2 (two) times daily. Kathlen Mody, MD Taking Active Multiple Informants  feeding supplement (ENSURE ENLIVE / ENSURE PLUS) LIQD [id number] Yes Take 237 mLs by mouth 3 (three) times daily between meals.  Patient taking differently: Take 237 mLs by mouth daily.   Alberteen Sam, MD Taking Active Multiple Informants           Med Note Worthy Rancher, [name]   Fri Nov 19, 2021  1:28 PM) Non-compliant  HYDROcodone-acetaminophen (NORCO/VICODIN) 5-325 MG tablet [id number] Yes Take 1 tablet by mouth every 6 (six) hours as needed for moderate pain. Alberteen Sam, MD Taking Active Multiple Informants           Med Note Carollee Herter Jan 17, 2022  2:21 PM) Patient takes twice a day  loperamide (IMODIUM A-D) 2 MG tablet [id number] Yes Take 2-4 mg by mouth 4 (four) times daily as needed for diarrhea or loose stools. [provider] Taking Active Multiple Informants  metoprolol tartrate (LOPRESSOR) 25 MG tablet [id number] Yes Take 25-37.5 mg by mouth See admin instructions. Take 25 mg by mouth in the morning then 37.5 (1.5 tablets) in the evening [provider] Taking Active Multiple Informants           Med Note Dione Housekeeper, EBONY J   Mon Jan 17, 2022  2:21 PM) Take 25 mg daily  Nystatin (GERHARDT'S BUTT CREAM) CREA [id number] No Apply 1 application topically 3 (three) times daily.  Patient not taking: Reported on 01/17/2022   Kathlen Mody, MD Not Taking Active Multiple Informants  pantoprazole (PROTONIX) 40 MG tablet [id number] Yes Take 1 tablet (40 mg total) by mouth 2 (two) times daily. Kathlen Mody, MD Taking Active Multiple Informants  potassium chloride SA (KLOR-CON M) 20 MEQ tablet [id number] Yes Take 20 mEq by mouth daily. 2 tablets with food [provider] Taking Active   potassium chloride  SA (KLOR-CON) 20 MEQ tablet [id number] No Take 2 tablets (40 mEq total) by mouth daily.  Patient not taking: Reported on 01/17/2022   Kathlen Mody, MD Not Taking Consider Medication Status and Discontinue (Change in therapy) Multiple Informants  prochlorperazine (COMPAZINE) 10 MG tablet [id number] Yes TAKE 1 TABLET(10 MG) BY MOUTH EVERY 6 HOURS AS NEEDED FOR NAUSEA OR VOMITING Clelia Croft, Blenda Nicely, MD Taking Active Multiple Informants  tamsulosin (FLOMAX) 0.4 MG CAPS capsule [id number] Yes Take 0.4 mg by mouth daily. [provider] Taking Active Multiple Informants  torsemide (DEMADEX) 20 MG tablet [id number] Yes Take 1 tablet (20 mg total) by mouth 2 (two) times daily. Alberteen Sam, MD Taking Active Multiple Informants  XARELTO 20 MG TABS tablet [id number] Yes Take 1 tablet (20 mg total) by mouth daily with supper. Kathlen Mody, MD Taking Active Multiple Informants           Med Note Joella Prince A   Tue Nov 02, 2021 10:07 AM)               SDOH:  (Social Determinants of Health) assessments and interventions performed:     Care Plan  Review of patient past medical history, allergies, medications, health status, including review of consultants reports, laboratory and other test data, was performed as part of comprehensive evaluation for care management services.   Care Plan : RN Care Manager Plan of Care  Updates made by Alejandro Mulling, RN since 01/26/2022 12:00 AM     Problem: Knowledge Deficit related to Hypertension   Priority: High     Long-Range Goal: Development of a Plan of Care for management of Hypertension   Start Date: 10/15/2021  Expected End Date: 03/25/2022  This Visit's Progress: On track  Recent Progress: On track  Priority: High  Note:   Current Barriers:  Knowledge Deficits related to plan of care for management of HTN   RNCM Clinical Goal(s):  Patient will verbalize understanding of plan for management of HTN as evidenced by teach-back method of  understanding take all medications exactly as prescribed and will call provider for medication related questions as evidenced by self report and chart notification  through collaboration with RN Care manager, provider, and care team.   Interventions: Inter-disciplinary care team collaboration (see longitudinal plan of care) Evaluation of current treatment plan related to  self management and patient's adherence to plan as established by provider   Hypertension Interventions:  (Status:  Goal on track:  Yes.) Long Term Goal Last practice recorded BP readings:  BP Readings from Last 3 Encounters:  10/07/21 (!) 90/58  09/21/21 (!) 107/58  08/13/21 106/71  Most recent eGFR/CrCl:  Lab Results  Component Value Date   EGFR 27 (L) 07/22/2021    No components found for: CRCL  Evaluation of current treatment plan related to hypertension self management and patient's adherence to plan as established by provider Provided education to patient re: stroke prevention, s/s of heart attack and stroke Reviewed medications with patient and discussed importance of compliance Provided assistance with obtaining home blood  pressure monitor via [facility] services will mail this DME to pt's confirmed address; Discussed plans with patient for ongoing care management follow up and provided patient with direct contact information for care management team Advised patient, providing education and rationale, to monitor blood pressure daily and record, calling PCP for findings outside established parameters Reviewed scheduled/upcoming provider appointments including:  Discussed complications of poorly controlled blood pressure such as heart disease, stroke, circulatory complications, vision complications, kidney impairment, sexual dysfunction Screening for signs and symptoms of depression related to chronic disease state  Assessed social determinant of health barriers  Update 2/20-Spoke with pt and permitted to speak with  the caregiver in the home Staci Righter in the home. Reports pt is doing well with ongoing [facility] in the home for PT/OT/RN/Aide services. Denies any falls however pt is very limited with his mobility unable to stand or walk at this time. Reports good blood pressures from the providers with no acute issues or events. Will reiterate on the discussed plan of care and continue to offer available services for social worker or pharmacy with ongoing needs however encouraged caregiver to outreach to the involved agency while active with [facility] if these services are available. Verified all appointments with a recent follow with urologist post op stent placed. Update 3/3 Post op hospital visit. Pt reports he is doing well and provided permission to speak with caregiver today Britta Mccreedy) who indicated pt has been very weak and HHPT/OT has been post-prone until pt regains his strength. States the [facility] continues to visit with a pending appointment on Monday, infectious provider on next Friday and nephrologist on Tuesday to follow up with additional labs concerning his infection. Pt started back on antibiotics 3 days ago. Reports blood pressures readings have been good with no acute readings since the last conversation. Update 4/3:Live-in significant other Britta Mccreedy) reports pt is doing "really well" with ongoing HHPT/OT/RN with [facility] however loss the aide but will inquire on obtaining another for ADLs. Reports pt's bp remains normal with no acute readings or issues. Reports pt no longer has a PICC line discontinued with no additional antibiotics. Reviewed the plan of care and encouraged ongoing adherence with available resources from [facility] social worker and pharmacy if needed. No additional needs at this time. Remains receptive with monthly follow up calls. Update 5/3: Spoke with live-in companion Staci Righter) concerning pt's ongoing management of care. Reports pt is doing well however recent issues was hypotension  systolic reads 80-88. CAD provider consulted and pt treated which was started and systolic pressures now around 782 much improved. Also reports increase in activity with visiting the facility's gym qod with no issues. Will review the plan of care and continue to encourage adherence related to HTN management of care. No needs to address today and all inquires and questions addressed accordingly.  Patient Goals/Self-Care Activities: Take all medications as prescribed Attend all scheduled provider appointments Call pharmacy for medication refills 3-7 days in advance of running out of medications Attend church or other social activities Perform all self care activities independently  Perform IADL's (shopping, preparing meals, housekeeping, managing finances) independently Call provider office for new concerns or questions  check blood pressure weekly choose a place to take my blood pressure (home, clinic or office, retail store) write blood pressure results in a log or diary learn about high blood pressure keep a blood pressure log take blood pressure log to all doctor appointments call doctor for signs and symptoms of high blood pressure keep all doctor  appointments take medications for blood pressure exactly as prescribed report new symptoms to your doctor  Follow Up Plan:  Telephone follow up appointment with care management team member scheduled for:  May 2023 The patient has been provided with contact information for the care management team and has been advised to call with any health related questions or concerns.        Elliot Cousin, RN Care Management Coordinator [facility] Main Office [phone]

## 2022-01-28 DIAGNOSIS — L89152 Pressure ulcer of sacral region, stage 2: Secondary | ICD-10-CM | POA: Diagnosis not present

## 2022-01-28 DIAGNOSIS — C670 Malignant neoplasm of trigone of bladder: Secondary | ICD-10-CM | POA: Diagnosis not present

## 2022-01-28 DIAGNOSIS — I5032 Chronic diastolic (congestive) heart failure: Secondary | ICD-10-CM | POA: Diagnosis not present

## 2022-01-28 DIAGNOSIS — N183 Chronic kidney disease, stage 3 unspecified: Secondary | ICD-10-CM | POA: Diagnosis not present

## 2022-01-28 DIAGNOSIS — I130 Hypertensive heart and chronic kidney disease with heart failure and stage 1 through stage 4 chronic kidney disease, or unspecified chronic kidney disease: Secondary | ICD-10-CM | POA: Diagnosis not present

## 2022-01-28 DIAGNOSIS — J9611 Chronic respiratory failure with hypoxia: Secondary | ICD-10-CM | POA: Diagnosis not present

## 2022-01-30 ENCOUNTER — Encounter: Payer: Self-pay | Admitting: Cardiology

## 2022-01-30 DIAGNOSIS — N183 Chronic kidney disease, stage 3 unspecified: Secondary | ICD-10-CM | POA: Insufficient documentation

## 2022-01-30 DIAGNOSIS — N1832 Chronic kidney disease, stage 3b: Secondary | ICD-10-CM | POA: Insufficient documentation

## 2022-01-30 DIAGNOSIS — I959 Hypotension, unspecified: Secondary | ICD-10-CM | POA: Insufficient documentation

## 2022-01-30 DIAGNOSIS — I77811 Abdominal aortic ectasia: Secondary | ICD-10-CM | POA: Insufficient documentation

## 2022-01-30 NOTE — Assessment & Plan Note (Addendum)
Abdominal aortic atherosclerotic disease, this was accounted for in the CHA2DS2-VASc score..   ? 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  Follow-up evaluation in 2 years. ? ?Continue risk factor modification with statin.  Blood pressure is actually well controlled.  Lipids have also been well controlled. ?

## 2022-01-30 NOTE — Assessment & Plan Note (Signed)
I think has been was euvolemic based on his level of edema and no PND/orthopnea. ? ?With hypotension, no need for afterload reduction.  We will actually wean off beta-blocker. ?

## 2022-01-30 NOTE — Assessment & Plan Note (Signed)
Actually no recurrent wounds.  His swelling is notably better.  Still has venous stasis changes but doing fine. ? ?Now that his legs are down, I do think that we can probably have him start wearing support stockings, but is very difficult for him to put them on.  Perhaps Ace wraps or even Unaboot would be a good option. ? ?He is currently taking torsemide twice daily but I think his PCP reduced it down.  No longer on metolazone. ? ?I will have him reduce it down to once daily and take an additional dose as needed. ?

## 2022-01-30 NOTE — Assessment & Plan Note (Signed)
Clearly no longer hypertensive.  He is very much hypotensive. ? ?We offered a beta-blocker and continue to reduce his torsemide dose now to daily. ?

## 2022-01-30 NOTE — Assessment & Plan Note (Signed)
He has not been using his CPAP very well.  I encouraged him to please try going back on injectable definitely help his pulm hypertension. ?

## 2022-01-30 NOTE — Assessment & Plan Note (Signed)
Combination of probably some mild systemic dehydration but also on diuretic and beta-blocker. ? ?Plan: Wean off metoprolol-he is only taking 25 mg daily, he will take half tablet daily for 2 weeks and then stop altogether.  We will also decrease torsemide to once daily using the second dose PRN only for worsening edema or weight gain. ? ?We will add midodrine 2.5 mg twice daily-may potentially titrate further if pressures remain low. ? ?We will see in close follow-up. ?

## 2022-01-30 NOTE — Assessment & Plan Note (Signed)
Completely asymptomatic.  Rate pretty well controlled.  Not unexpectedly it probably is playing a role in his dyspnea, but there is a lot of other factors going on. ? ?He is on Xarelto which is always his creatinine is 2, is probably okay, but may need to adjust (will ask our clinical pharmacist to review) ? ?He is currently on very low-dose metoprolol, ominous have him simply wean off completely.  We may want to use diltiazem short acting doses for rate control if he goes fast.  Diltiazem would have more benefit for pulmonary hypertension and less likely to cause hypotension. ?

## 2022-01-30 NOTE — Assessment & Plan Note (Signed)
History of venous stasis with bilateral iliac vein stenting.  Based on most recent CT of the chest abdomen pelvis, venous stents appear to be patent. ?

## 2022-01-30 NOTE — Assessment & Plan Note (Signed)
Most recent creatinine is 2.0. ?Certainly this is not helped by his hypotension.  We will add midodrine to help increase systolic blood pressure, wean off beta-blocker, reduced torsemide. ? ?Actually would encourage hydration, and nutrition.[name] ?

## 2022-01-30 NOTE — Assessment & Plan Note (Addendum)
Clearly suggested on recent 2D echo.  Mostly because he has been out of the hospital quite a bit, I am reluctant to evaluate with a right heart cath anytime soon.  He has relatively stable with notable reduction in edema.  He is 20 pounds down.  Hypotensive. ?My actual plan is to wean down his diuretic and beta-blocker and add midodrine. ? ?We can reassess him in a few months to see how he is doing.  If he has been stable with no UTI issues, would consider right heart cath simply to quantify his pulmonary hypertension. ? ?Stressed the importance of using CPAP. ?

## 2022-01-30 NOTE — Assessment & Plan Note (Signed)
Lipids have not been checked since May 2022.  The LDL was 46 at that time.  I would imagine with all his illnesses and weight loss, should be better now.  He remains on 10 mg atorvastatin. ?He tells me his PCP recently checked his blood work.  If not we will have it checked here in follow-up.  Should be due in May. ?

## 2022-02-01 DIAGNOSIS — I130 Hypertensive heart and chronic kidney disease with heart failure and stage 1 through stage 4 chronic kidney disease, or unspecified chronic kidney disease: Secondary | ICD-10-CM | POA: Diagnosis not present

## 2022-02-01 DIAGNOSIS — L89152 Pressure ulcer of sacral region, stage 2: Secondary | ICD-10-CM | POA: Diagnosis not present

## 2022-02-01 DIAGNOSIS — I5032 Chronic diastolic (congestive) heart failure: Secondary | ICD-10-CM | POA: Diagnosis not present

## 2022-02-01 DIAGNOSIS — N183 Chronic kidney disease, stage 3 unspecified: Secondary | ICD-10-CM | POA: Diagnosis not present

## 2022-02-01 DIAGNOSIS — J9611 Chronic respiratory failure with hypoxia: Secondary | ICD-10-CM | POA: Diagnosis not present

## 2022-02-01 DIAGNOSIS — C670 Malignant neoplasm of trigone of bladder: Secondary | ICD-10-CM | POA: Diagnosis not present

## 2022-02-03 DIAGNOSIS — N183 Chronic kidney disease, stage 3 unspecified: Secondary | ICD-10-CM | POA: Diagnosis not present

## 2022-02-03 DIAGNOSIS — I5032 Chronic diastolic (congestive) heart failure: Secondary | ICD-10-CM | POA: Diagnosis not present

## 2022-02-03 DIAGNOSIS — J9611 Chronic respiratory failure with hypoxia: Secondary | ICD-10-CM | POA: Diagnosis not present

## 2022-02-03 DIAGNOSIS — C670 Malignant neoplasm of trigone of bladder: Secondary | ICD-10-CM | POA: Diagnosis not present

## 2022-02-03 DIAGNOSIS — L89152 Pressure ulcer of sacral region, stage 2: Secondary | ICD-10-CM | POA: Diagnosis not present

## 2022-02-03 DIAGNOSIS — I130 Hypertensive heart and chronic kidney disease with heart failure and stage 1 through stage 4 chronic kidney disease, or unspecified chronic kidney disease: Secondary | ICD-10-CM | POA: Diagnosis not present

## 2022-02-04 DIAGNOSIS — L89152 Pressure ulcer of sacral region, stage 2: Secondary | ICD-10-CM | POA: Diagnosis not present

## 2022-02-04 DIAGNOSIS — I959 Hypotension, unspecified: Secondary | ICD-10-CM | POA: Diagnosis not present

## 2022-02-04 DIAGNOSIS — N183 Chronic kidney disease, stage 3 unspecified: Secondary | ICD-10-CM | POA: Diagnosis not present

## 2022-02-04 DIAGNOSIS — I129 Hypertensive chronic kidney disease with stage 1 through stage 4 chronic kidney disease, or unspecified chronic kidney disease: Secondary | ICD-10-CM | POA: Diagnosis not present

## 2022-02-04 DIAGNOSIS — N2581 Secondary hyperparathyroidism of renal origin: Secondary | ICD-10-CM | POA: Diagnosis not present

## 2022-02-04 DIAGNOSIS — C670 Malignant neoplasm of trigone of bladder: Secondary | ICD-10-CM | POA: Diagnosis not present

## 2022-02-04 DIAGNOSIS — N189 Chronic kidney disease, unspecified: Secondary | ICD-10-CM | POA: Diagnosis not present

## 2022-02-04 DIAGNOSIS — J9611 Chronic respiratory failure with hypoxia: Secondary | ICD-10-CM | POA: Diagnosis not present

## 2022-02-04 DIAGNOSIS — C689 Malignant neoplasm of urinary organ, unspecified: Secondary | ICD-10-CM | POA: Diagnosis not present

## 2022-02-04 DIAGNOSIS — N1330 Unspecified hydronephrosis: Secondary | ICD-10-CM | POA: Diagnosis not present

## 2022-02-04 DIAGNOSIS — D631 Anemia in chronic kidney disease: Secondary | ICD-10-CM | POA: Diagnosis not present

## 2022-02-04 DIAGNOSIS — I5032 Chronic diastolic (congestive) heart failure: Secondary | ICD-10-CM | POA: Diagnosis not present

## 2022-02-04 DIAGNOSIS — I130 Hypertensive heart and chronic kidney disease with heart failure and stage 1 through stage 4 chronic kidney disease, or unspecified chronic kidney disease: Secondary | ICD-10-CM | POA: Diagnosis not present

## 2022-02-07 DIAGNOSIS — I5032 Chronic diastolic (congestive) heart failure: Secondary | ICD-10-CM | POA: Diagnosis not present

## 2022-02-07 DIAGNOSIS — N183 Chronic kidney disease, stage 3 unspecified: Secondary | ICD-10-CM | POA: Diagnosis not present

## 2022-02-07 DIAGNOSIS — C670 Malignant neoplasm of trigone of bladder: Secondary | ICD-10-CM | POA: Diagnosis not present

## 2022-02-07 DIAGNOSIS — L89152 Pressure ulcer of sacral region, stage 2: Secondary | ICD-10-CM | POA: Diagnosis not present

## 2022-02-07 DIAGNOSIS — J9611 Chronic respiratory failure with hypoxia: Secondary | ICD-10-CM | POA: Diagnosis not present

## 2022-02-07 DIAGNOSIS — I130 Hypertensive heart and chronic kidney disease with heart failure and stage 1 through stage 4 chronic kidney disease, or unspecified chronic kidney disease: Secondary | ICD-10-CM | POA: Diagnosis not present

## 2022-02-08 DIAGNOSIS — J9611 Chronic respiratory failure with hypoxia: Secondary | ICD-10-CM | POA: Diagnosis not present

## 2022-02-08 DIAGNOSIS — M1990 Unspecified osteoarthritis, unspecified site: Secondary | ICD-10-CM | POA: Diagnosis not present

## 2022-02-08 DIAGNOSIS — G8929 Other chronic pain: Secondary | ICD-10-CM | POA: Diagnosis not present

## 2022-02-08 DIAGNOSIS — Z960 Presence of urogenital implants: Secondary | ICD-10-CM | POA: Diagnosis not present

## 2022-02-08 DIAGNOSIS — Z79891 Long term (current) use of opiate analgesic: Secondary | ICD-10-CM | POA: Diagnosis not present

## 2022-02-08 DIAGNOSIS — Z7901 Long term (current) use of anticoagulants: Secondary | ICD-10-CM | POA: Diagnosis not present

## 2022-02-08 DIAGNOSIS — C670 Malignant neoplasm of trigone of bladder: Secondary | ICD-10-CM | POA: Diagnosis not present

## 2022-02-08 DIAGNOSIS — I872 Venous insufficiency (chronic) (peripheral): Secondary | ICD-10-CM | POA: Diagnosis not present

## 2022-02-08 DIAGNOSIS — Z993 Dependence on wheelchair: Secondary | ICD-10-CM | POA: Diagnosis not present

## 2022-02-08 DIAGNOSIS — I4821 Permanent atrial fibrillation: Secondary | ICD-10-CM | POA: Diagnosis not present

## 2022-02-08 DIAGNOSIS — E669 Obesity, unspecified: Secondary | ICD-10-CM | POA: Diagnosis not present

## 2022-02-08 DIAGNOSIS — N183 Chronic kidney disease, stage 3 unspecified: Secondary | ICD-10-CM | POA: Diagnosis not present

## 2022-02-08 DIAGNOSIS — G4733 Obstructive sleep apnea (adult) (pediatric): Secondary | ICD-10-CM | POA: Diagnosis not present

## 2022-02-08 DIAGNOSIS — I130 Hypertensive heart and chronic kidney disease with heart failure and stage 1 through stage 4 chronic kidney disease, or unspecified chronic kidney disease: Secondary | ICD-10-CM | POA: Diagnosis not present

## 2022-02-08 DIAGNOSIS — I272 Pulmonary hypertension, unspecified: Secondary | ICD-10-CM | POA: Diagnosis not present

## 2022-02-08 DIAGNOSIS — Z9181 History of falling: Secondary | ICD-10-CM | POA: Diagnosis not present

## 2022-02-08 DIAGNOSIS — Z6835 Body mass index (BMI) 35.0-35.9, adult: Secondary | ICD-10-CM | POA: Diagnosis not present

## 2022-02-08 DIAGNOSIS — I5032 Chronic diastolic (congestive) heart failure: Secondary | ICD-10-CM | POA: Diagnosis not present

## 2022-02-10 DIAGNOSIS — N183 Chronic kidney disease, stage 3 unspecified: Secondary | ICD-10-CM | POA: Diagnosis not present

## 2022-02-10 DIAGNOSIS — I130 Hypertensive heart and chronic kidney disease with heart failure and stage 1 through stage 4 chronic kidney disease, or unspecified chronic kidney disease: Secondary | ICD-10-CM | POA: Diagnosis not present

## 2022-02-10 DIAGNOSIS — I5032 Chronic diastolic (congestive) heart failure: Secondary | ICD-10-CM | POA: Diagnosis not present

## 2022-02-10 DIAGNOSIS — I272 Pulmonary hypertension, unspecified: Secondary | ICD-10-CM | POA: Diagnosis not present

## 2022-02-10 DIAGNOSIS — C670 Malignant neoplasm of trigone of bladder: Secondary | ICD-10-CM | POA: Diagnosis not present

## 2022-02-10 DIAGNOSIS — J9611 Chronic respiratory failure with hypoxia: Secondary | ICD-10-CM | POA: Diagnosis not present

## 2022-02-15 DIAGNOSIS — N183 Chronic kidney disease, stage 3 unspecified: Secondary | ICD-10-CM | POA: Diagnosis not present

## 2022-02-15 DIAGNOSIS — I5032 Chronic diastolic (congestive) heart failure: Secondary | ICD-10-CM | POA: Diagnosis not present

## 2022-02-15 DIAGNOSIS — I272 Pulmonary hypertension, unspecified: Secondary | ICD-10-CM | POA: Diagnosis not present

## 2022-02-15 DIAGNOSIS — C670 Malignant neoplasm of trigone of bladder: Secondary | ICD-10-CM | POA: Diagnosis not present

## 2022-02-15 DIAGNOSIS — I130 Hypertensive heart and chronic kidney disease with heart failure and stage 1 through stage 4 chronic kidney disease, or unspecified chronic kidney disease: Secondary | ICD-10-CM | POA: Diagnosis not present

## 2022-02-15 DIAGNOSIS — J9611 Chronic respiratory failure with hypoxia: Secondary | ICD-10-CM | POA: Diagnosis not present

## 2022-02-17 DIAGNOSIS — I272 Pulmonary hypertension, unspecified: Secondary | ICD-10-CM | POA: Diagnosis not present

## 2022-02-17 DIAGNOSIS — J9611 Chronic respiratory failure with hypoxia: Secondary | ICD-10-CM | POA: Diagnosis not present

## 2022-02-17 DIAGNOSIS — I5032 Chronic diastolic (congestive) heart failure: Secondary | ICD-10-CM | POA: Diagnosis not present

## 2022-02-17 DIAGNOSIS — I130 Hypertensive heart and chronic kidney disease with heart failure and stage 1 through stage 4 chronic kidney disease, or unspecified chronic kidney disease: Secondary | ICD-10-CM | POA: Diagnosis not present

## 2022-02-17 DIAGNOSIS — C670 Malignant neoplasm of trigone of bladder: Secondary | ICD-10-CM | POA: Diagnosis not present

## 2022-02-17 DIAGNOSIS — N183 Chronic kidney disease, stage 3 unspecified: Secondary | ICD-10-CM | POA: Diagnosis not present

## 2022-02-21 DIAGNOSIS — J9611 Chronic respiratory failure with hypoxia: Secondary | ICD-10-CM | POA: Diagnosis not present

## 2022-02-21 DIAGNOSIS — C670 Malignant neoplasm of trigone of bladder: Secondary | ICD-10-CM | POA: Diagnosis not present

## 2022-02-21 DIAGNOSIS — N183 Chronic kidney disease, stage 3 unspecified: Secondary | ICD-10-CM | POA: Diagnosis not present

## 2022-02-21 DIAGNOSIS — I130 Hypertensive heart and chronic kidney disease with heart failure and stage 1 through stage 4 chronic kidney disease, or unspecified chronic kidney disease: Secondary | ICD-10-CM | POA: Diagnosis not present

## 2022-02-21 DIAGNOSIS — I272 Pulmonary hypertension, unspecified: Secondary | ICD-10-CM | POA: Diagnosis not present

## 2022-02-21 DIAGNOSIS — I5032 Chronic diastolic (congestive) heart failure: Secondary | ICD-10-CM | POA: Diagnosis not present

## 2022-02-23 DIAGNOSIS — I130 Hypertensive heart and chronic kidney disease with heart failure and stage 1 through stage 4 chronic kidney disease, or unspecified chronic kidney disease: Secondary | ICD-10-CM | POA: Diagnosis not present

## 2022-02-23 DIAGNOSIS — C670 Malignant neoplasm of trigone of bladder: Secondary | ICD-10-CM | POA: Diagnosis not present

## 2022-02-23 DIAGNOSIS — N183 Chronic kidney disease, stage 3 unspecified: Secondary | ICD-10-CM | POA: Diagnosis not present

## 2022-02-23 DIAGNOSIS — J9611 Chronic respiratory failure with hypoxia: Secondary | ICD-10-CM | POA: Diagnosis not present

## 2022-02-23 DIAGNOSIS — I272 Pulmonary hypertension, unspecified: Secondary | ICD-10-CM | POA: Diagnosis not present

## 2022-02-23 DIAGNOSIS — I5032 Chronic diastolic (congestive) heart failure: Secondary | ICD-10-CM | POA: Diagnosis not present

## 2022-02-24 ENCOUNTER — Other Ambulatory Visit: Payer: Self-pay | Admitting: *Deleted

## 2022-02-24 NOTE — Patient Outreach (Signed)
[facility]) Care Management Telephonic RN Care Manager Note   02/24/2022 Name:  Patrick Bishop MRN:  [id number] DOB:  03-11-1941  Summary: Caregiver reports issues with pt blood pressure (hypotension) however treated and continue to be monitored by the visiting nurses.Pt doing well with no additional issues. Plan of care discussed and caregiver continue to work with pt concerning all his appointments and medications.   Recommendations/Changes made from today's visit: Will continue to encouraged ongoing adherence and update the provider concerning pt's ongoing adherence and disposition with [facility] services.  Subjective: Patrick Bishop is an 81 y.o. year old male who is a primary patient of [name], [name], MD. The care management team was consulted for assistance with care management and/or care coordination needs.    Telephonic RN Care Manager completed Telephone Visit today.  Objective:   Medications Reviewed Today     Reviewed by [name], MD (Physician) on 01/30/22 at 1558  Med List Status: <None>   Medication Order Taking? Sig Documenting Provider Last Dose Status Informant  acetaminophen (TYLENOL) 325 MG tablet [id number] No Take 2 tablets (650 mg total) by mouth every 6 (six) hours as needed for mild pain (or Fever >/= 101).  Patient not taking: Reported on 11/02/2021   [name], MD Not Taking Consider Medication Status and Discontinue (Change in therapy) Multiple Informants  acetaminophen (TYLENOL) 325 MG tablet [id number] No Take 325 mg by mouth every 6 (six) hours as needed.  Patient not taking: Reported on 01/17/2022   [provider] Not Taking Active   atorvastatin (LIPITOR) 10 MG tablet [id number]  TAKE 1 TABLET DAILY [name], MD  Active   docusate sodium (COLACE) 100 MG capsule [id number] Yes Take 1 capsule (100 mg total) by mouth 2 (two) times daily. [name], MD Taking Active Multiple Informants  feeding supplement  (ENSURE ENLIVE / ENSURE PLUS) LIQD [id number] Yes Take 237 mLs by mouth 3 (three) times daily between meals.  Patient taking differently: Take 237 mLs by mouth daily.   [name], MD Taking Active Multiple Informants           Med Note [name], [name]   Fri Nov 19, 2021  1:28 PM) Non-compliant  HYDROcodone-acetaminophen (NORCO/VICODIN) 5-325 MG tablet [id number] Yes Take 1 tablet by mouth every 6 (six) hours as needed for moderate pain. [name], MD Taking Active Multiple Informants           Med Note Mauri Reading Jan 17, 2022  2:21 PM) Patient takes twice a day  loperamide (IMODIUM A-D) 2 MG tablet [id number] Yes Take 2-4 mg by mouth 4 (four) times daily as needed for diarrhea or loose stools. [provider] Taking Active Multiple Informants  midodrine (PROAMATINE) 2.5 MG tablet [id number] Yes Take 1 tablet (2.5 mg total) by mouth 2 (two) times daily with a meal. [name], MD  Active   Nystatin (GERHARDT'S BUTT CREAM) CREA [id number] No Apply 1 application topically 3 (three) times daily.  Patient not taking: Reported on 01/17/2022   [name], MD Not Taking Active Multiple Informants  pantoprazole (PROTONIX) 40 MG tablet [id number] Yes Take 1 tablet (40 mg total) by mouth 2 (two) times daily. [name], MD Taking Active Multiple Informants  potassium chloride SA (KLOR-CON) 20 MEQ tablet [id number] No Take 2 tablets (40 mEq total) by mouth daily.  Patient not taking: Reported on 01/17/2022   [name], MD Not Taking Consider Medication Status  and Discontinue (Change in therapy) Multiple Informants  prochlorperazine (COMPAZINE) 10 MG tablet [id number] Yes TAKE 1 TABLET(10 MG) BY MOUTH EVERY 6 HOURS AS NEEDED FOR NAUSEA OR VOMITING Shadad, Mathis Dad, MD Taking Active Multiple Informants  tamsulosin (FLOMAX) 0.4 MG CAPS capsule [id number] Yes Take 0.4 mg by mouth daily. [provider] Taking Active Multiple Informants  torsemide  (DEMADEX) 20 MG tablet [id number]  Take 20 mg  by mouth daily , may take an additional 20 mg if needed for swelling or dyspnea [name], MD  Active   XARELTO 20 MG TABS tablet [id number] Yes Take 1 tablet (20 mg total) by mouth daily with supper. [name], MD Taking Active Multiple Informants           Med Note [name]   Tue Nov 02, 2021 10:07 AM)               SDOH:  (Social Determinants of Health) assessments and interventions performed:     Care Plan  Review of patient past medical history, allergies, medications, health status, including review of consultants reports, laboratory and other test data, was performed as part of comprehensive evaluation for care management services.   Care Plan : RN Care Manager Plan of Care  Updates made by Tobi Bastos, RN since 02/24/2022 12:00 AM     Problem: Knowledge Deficit related to Hypertension   Priority: High     Long-Range Goal: Development of a Plan of Care for management of Hypertension   Start Date: 10/15/2021  Expected End Date: 08/25/2022  This Visit's Progress: On track  Recent Progress: On track  Priority: High  Note:   Current Barriers:  Knowledge Deficits related to plan of care for management of HTN   RNCM Clinical Goal(s):  Patient will verbalize understanding of plan for management of HTN as evidenced by teach-back method of understanding take all medications exactly as prescribed and will call provider for medication related questions as evidenced by self report and chart notification  through collaboration with RN Care manager, provider, and care team.   Interventions: Inter-disciplinary care team collaboration (see longitudinal plan of care) Evaluation of current treatment plan related to  self management and patient's adherence to plan as established by provider   Hypertension Interventions:  (Status:  Goal on track:  Yes.) Long Term Goal Last practice recorded BP readings:  BP Readings  from Last 3 Encounters:  10/07/21 (!) 90/58  09/21/21 (!) 107/58  08/13/21 106/71  Most recent eGFR/CrCl:  Lab Results  Component Value Date   EGFR 27 (L) 07/22/2021    No components found for: CRCL  Evaluation of current treatment plan related to hypertension self management and patient's adherence to plan as established by provider Provided education to patient re: stroke prevention, s/s of heart attack and stroke Reviewed medications with patient and discussed importance of compliance Provided assistance with obtaining home blood pressure monitor via [facility] services will mail this DME to pt's confirmed address; Discussed plans with patient for ongoing care management follow up and provided patient with direct contact information for care management team Advised patient, providing education and rationale, to monitor blood pressure daily and record, calling PCP for findings outside established parameters Reviewed scheduled/upcoming provider appointments including:  Discussed complications of poorly controlled blood pressure such as heart disease, stroke, circulatory complications, vision complications, kidney impairment, sexual dysfunction Screening for signs and symptoms of depression related to chronic disease state  Assessed social determinant of health barriers  Update 2/20-Spoke with pt and permitted to speak with the caregiver in the home Aldean Ast in the home. Reports pt is doing well with ongoing [facility] in the home for PT/OT/RN/Aide services. Denies any falls however pt is very limited with his mobility unable to stand or walk at this time. Reports good blood pressures from the providers with no acute issues or events. Will reiterate on the discussed plan of care and continue to offer available services for social worker or pharmacy with ongoing needs however encouraged caregiver to outreach to the involved agency while active with [facility] if these services are available. Verified  all appointments with a recent follow with urologist post op stent placed. Update 3/3 Post op hospital visit. Pt reports he is doing well and provided permission to speak with caregiver today [name]) who indicated pt has been very weak and HHPT/OT has been post-prone until pt regains his strength. States the [facility] continues to visit with a pending appointment on Monday, infectious provider on next Friday and nephrologist on Tuesday to follow up with additional labs concerning his infection. Pt started back on antibiotics 3 days ago. Reports blood pressures readings have been good with no acute readings since the last conversation. Update 4/3:Live-in significant other [name]) reports pt is doing "really well" with ongoing HHPT/OT/RN with [facility] however loss the aide but will inquire on obtaining another for ADLs. Reports pt's bp remains normal with no acute readings or issues. Reports pt no longer has a PICC line discontinued with no additional antibiotics. Reviewed the plan of care and encouraged ongoing adherence with available resources from [place] and pharmacy if needed. No additional needs at this time. Remains receptive with monthly follow up calls. Update 5/3: Spoke with live-in companion Aldean Ast) concerning pt's ongoing management of care. Reports pt is doing well however recent issues was hypotension systolic reads 63-78. CAD provider consulted and pt treated which was started and systolic pressures now around 110 much improved. Also reports increase in activity with visiting the facility's gym qod with no issues. Will review the plan of care and continue to encourage adherence related to HTN management of care. No needs to address today and all inquires and questions addressed accordingly. Update 02/24/2022: Spoke with Aldean Ast who indicates pt was having issues with his blood pressures and the visiting nurse address with provider who increased his medications. Reports the pt's  blood pressures readings have improved with the reported systolic as high as 588 with no reported symptoms at this time. Address all inquires and no additional needs at this. Verified pt received the requested AD packet with no inquires related. Reviewed and discussed the current plan of care and continue to encouraged adherence with all discussed. No further needs today. Will continue monthly follow up calls and alert the provider on pt's ongoing disposition with [facility] services.  Patient Goals/Self-Care Activities: Take all medications as prescribed Attend all scheduled provider appointments Call pharmacy for medication refills 3-7 days in advance of running out of medications Attend church or other social activities Perform all self care activities independently  Perform IADL's (shopping, preparing meals, housekeeping, managing finances) independently Call provider office for new concerns or questions  check blood pressure weekly choose a place to take my blood pressure (home, clinic or office, retail store) write blood pressure results in a log or diary learn about high blood pressure keep a blood pressure log take blood pressure log to all doctor appointments call doctor for signs and symptoms  of high blood pressure keep all doctor appointments take medications for blood pressure exactly as prescribed report new symptoms to your doctor  Follow Up Plan:  Telephone follow up appointment with care management team member scheduled for:  July 2023 The patient has been provided with contact information for the care management team and has been advised to call with any health related questions or concerns.        Raina Mina, RN Care Management Coordinator [place] Office [phone]

## 2022-02-25 DIAGNOSIS — I5032 Chronic diastolic (congestive) heart failure: Secondary | ICD-10-CM | POA: Diagnosis not present

## 2022-02-25 DIAGNOSIS — N183 Chronic kidney disease, stage 3 unspecified: Secondary | ICD-10-CM | POA: Diagnosis not present

## 2022-02-25 DIAGNOSIS — I272 Pulmonary hypertension, unspecified: Secondary | ICD-10-CM | POA: Diagnosis not present

## 2022-02-25 DIAGNOSIS — C670 Malignant neoplasm of trigone of bladder: Secondary | ICD-10-CM | POA: Diagnosis not present

## 2022-02-25 DIAGNOSIS — J9611 Chronic respiratory failure with hypoxia: Secondary | ICD-10-CM | POA: Diagnosis not present

## 2022-02-25 DIAGNOSIS — I130 Hypertensive heart and chronic kidney disease with heart failure and stage 1 through stage 4 chronic kidney disease, or unspecified chronic kidney disease: Secondary | ICD-10-CM | POA: Diagnosis not present

## 2022-02-28 ENCOUNTER — Other Ambulatory Visit: Payer: Self-pay

## 2022-02-28 ENCOUNTER — Encounter (HOSPITAL_COMMUNITY): Payer: Self-pay | Admitting: Emergency Medicine

## 2022-02-28 ENCOUNTER — Emergency Department (HOSPITAL_COMMUNITY)
Admission: EM | Admit: 2022-02-28 | Discharge: 2022-03-01 | Disposition: A | Discharge status: Home / Self Care | Payer: Medicare Other | Attending: Emergency Medicine | Admitting: Emergency Medicine

## 2022-02-28 DIAGNOSIS — R0902 Hypoxemia: Secondary | ICD-10-CM | POA: Diagnosis not present

## 2022-02-28 DIAGNOSIS — I509 Heart failure, unspecified: Secondary | ICD-10-CM | POA: Diagnosis not present

## 2022-02-28 DIAGNOSIS — C688 Malignant neoplasm of overlapping sites of urinary organs: Secondary | ICD-10-CM | POA: Insufficient documentation

## 2022-02-28 DIAGNOSIS — R55 Syncope and collapse: Secondary | ICD-10-CM | POA: Insufficient documentation

## 2022-02-28 DIAGNOSIS — E1122 Type 2 diabetes mellitus with diabetic chronic kidney disease: Secondary | ICD-10-CM | POA: Insufficient documentation

## 2022-02-28 DIAGNOSIS — R011 Cardiac murmur, unspecified: Secondary | ICD-10-CM | POA: Diagnosis not present

## 2022-02-28 DIAGNOSIS — I499 Cardiac arrhythmia, unspecified: Secondary | ICD-10-CM | POA: Diagnosis not present

## 2022-02-28 DIAGNOSIS — N189 Chronic kidney disease, unspecified: Secondary | ICD-10-CM | POA: Insufficient documentation

## 2022-02-28 DIAGNOSIS — Z7901 Long term (current) use of anticoagulants: Secondary | ICD-10-CM | POA: Diagnosis not present

## 2022-02-28 DIAGNOSIS — W19XXXA Unspecified fall, initial encounter: Secondary | ICD-10-CM | POA: Diagnosis not present

## 2022-02-28 DIAGNOSIS — I130 Hypertensive heart and chronic kidney disease with heart failure and stage 1 through stage 4 chronic kidney disease, or unspecified chronic kidney disease: Secondary | ICD-10-CM | POA: Insufficient documentation

## 2022-02-28 DIAGNOSIS — R531 Weakness: Secondary | ICD-10-CM | POA: Insufficient documentation

## 2022-02-28 DIAGNOSIS — R5383 Other fatigue: Secondary | ICD-10-CM | POA: Insufficient documentation

## 2022-02-28 DIAGNOSIS — R4020 Unspecified coma: Secondary | ICD-10-CM | POA: Diagnosis not present

## 2022-02-28 LAB — CBC WITH DIFFERENTIAL/PLATELET
Abs Immature Granulocytes: 0.02 10*3/uL (ref 0.00–0.07)
Basophils Absolute: 0 10*3/uL (ref 0.0–0.1)
Basophils Relative: 0 %
Eosinophils Absolute: 0.1 10*3/uL (ref 0.0–0.5)
Eosinophils Relative: 1 %
HCT: 35.9 % — ABNORMAL LOW (ref 39.0–52.0)
Hemoglobin: 11.4 g/dL — ABNORMAL LOW (ref 13.0–17.0)
Immature Granulocytes: 0 %
Lymphocytes Relative: 7 %
Lymphs Abs: 0.5 10*3/uL — ABNORMAL LOW (ref 0.7–4.0)
MCH: 30.2 pg (ref 26.0–34.0)
MCHC: 31.8 g/dL (ref 30.0–36.0)
MCV: 95.2 fL (ref 80.0–100.0)
Monocytes Absolute: 0.6 10*3/uL (ref 0.1–1.0)
Monocytes Relative: 8 %
Neutro Abs: 5.8 10*3/uL (ref 1.7–7.7)
Neutrophils Relative %: 84 %
Platelets: 233 10*3/uL (ref 150–400)
RBC: 3.77 MIL/uL — ABNORMAL LOW (ref 4.22–5.81)
RDW: 14.6 % (ref 11.5–15.5)
WBC: 7 10*3/uL (ref 4.0–10.5)
nRBC: 0 % (ref 0.0–0.2)

## 2022-02-28 LAB — COMPREHENSIVE METABOLIC PANEL
ALT: 9 U/L (ref 0–44)
AST: 20 U/L (ref 15–41)
Albumin: 3 g/dL — ABNORMAL LOW (ref 3.5–5.0)
Alkaline Phosphatase: 78 U/L (ref 38–126)
Anion gap: 15 (ref 5–15)
BUN: 26 mg/dL — ABNORMAL HIGH (ref 8–23)
CO2: 26 mmol/L (ref 22–32)
Calcium: 8.9 mg/dL (ref 8.9–10.3)
Chloride: 101 mmol/L (ref 98–111)
Creatinine, Ser: 2.13 mg/dL — ABNORMAL HIGH (ref 0.61–1.24)
GFR, Estimated: 31 mL/min — ABNORMAL LOW (ref >60)
Glucose, Bld: 185 mg/dL — ABNORMAL HIGH (ref 70–99)
Potassium: 3 mmol/L — ABNORMAL LOW (ref 3.5–5.1)
Sodium: 142 mmol/L (ref 135–145)
Total Bilirubin: 1.2 mg/dL (ref 0.3–1.2)
Total Protein: 6.3 g/dL — ABNORMAL LOW (ref 6.5–8.1)

## 2022-02-28 LAB — LACTIC ACID, PLASMA
Lactic Acid, Venous: 6.3 mmol/L (ref 0.5–1.9)
Lactic Acid, Venous: 2.4 mmol/L (ref 0.5–1.9)

## 2022-02-28 LAB — MAGNESIUM: Magnesium: 2.2 mg/dL (ref 1.7–2.4)

## 2022-02-28 MED ORDER — SODIUM CHLORIDE 0.9 % IV BOLUS
2000.0000 mL | Freq: Once | INTRAVENOUS | Status: AC
  Administered 2022-02-28: 2000 mL via INTRAVENOUS

## 2022-02-28 NOTE — ED Notes (Signed)
Pt's s/o [name] called and updated regarding discharge.  [facility] called for transportation.

## 2022-02-28 NOTE — Discharge Instructions (Signed)
We saw you in the ER for weakness. All the results in the ER are normal, labs and imaging. We are not sure what is causing your symptoms. The workup in the ER is not complete, and is limited to screening for life threatening and emergent conditions only, so please see a primary care doctor for further evaluation.

## 2022-02-28 NOTE — ED Triage Notes (Signed)
Per [facility] pt coming from home- ems initially called out for lift assist after sliding out of bed. Skin tear to left elbow. When EMS attempting to lift pt he had a syncopal episode- became diaphoretic and vomited. EMS report concern for sepsis with UTI. Patient in A-fib RVR with rate in 150s. Given 250 CC NS en route.

## 2022-03-01 ENCOUNTER — Other Ambulatory Visit: Payer: Self-pay | Admitting: Nurse Practitioner

## 2022-03-01 DIAGNOSIS — R531 Weakness: Secondary | ICD-10-CM | POA: Diagnosis not present

## 2022-03-01 DIAGNOSIS — Z743 Need for continuous supervision: Secondary | ICD-10-CM | POA: Diagnosis not present

## 2022-03-01 DIAGNOSIS — R279 Unspecified lack of coordination: Secondary | ICD-10-CM | POA: Diagnosis not present

## 2022-03-01 NOTE — ED Provider Notes (Signed)
[facility] DEPT Provider Note   CSN: [id number] Arrival date & time: 02/28/22  1846     History  Chief Complaint  Patient presents with   Loss of Consciousness    Patrick Bishop is a 81 y.o. male.  HPI     81 year old male comes in with chief complaint of loss of consciousness. Patient arrives from home.  Patient has history of CHF, A-fib, pulmonary hypertension, CKD.  Patient indicates that he has generalized fatigue and weakness, he feels deconditioned and today he stepped out of the bed when trying to get out.  He was unable to get up.  He called EMS.  Allegedly, when EMS was trying to pick patient up, patient passed out.  Patient indicates that he has been eating and drinking well.  He denies any chest pain, cough, shortness of breath, headache, neck pain, URI-like symptoms, abdominal pain, nausea, vomiting, diarrhea.  He has incontinence, but denies any burning with urination.  No new rashes.  He indicates having no discomfort at all from the fall itself.   Patient also indicates that he lives at his home with a lady friend.  He also has home health and home therapy set up.  Home Medications Prior to Admission medications   Medication Sig Start Date End Date Taking? Authorizing Provider  acetaminophen (TYLENOL) 325 MG tablet Take 2 tablets (650 mg total) by mouth every 6 (six) hours as needed for mild pain (or Fever >/= 101). Patient not taking: Reported on 11/02/2021 09/21/21   [name], MD  acetaminophen (TYLENOL) 325 MG tablet Take 325 mg by mouth every 6 (six) hours as needed. Patient not taking: Reported on 01/17/2022    [provider]  atorvastatin (LIPITOR) 10 MG tablet TAKE 1 TABLET DAILY 01/24/22   [name], MD  docusate sodium (COLACE) 100 MG capsule Take 1 capsule (100 mg total) by mouth 2 (two) times daily. 08/13/21   [name], MD  feeding supplement (ENSURE ENLIVE / ENSURE PLUS) LIQD Take 237 mLs by  mouth 3 (three) times daily between meals. Patient taking differently: Take 237 mLs by mouth daily. 09/21/21   Danford, Suann Larry, MD  HYDROcodone-acetaminophen (NORCO/VICODIN) 5-325 MG tablet Take 1 tablet by mouth every 6 (six) hours as needed for moderate pain. 09/21/21   Danford, Suann Larry, MD  loperamide (IMODIUM A-D) 2 MG tablet Take 2-4 mg by mouth 4 (four) times daily as needed for diarrhea or loose stools.    [provider]  midodrine (PROAMATINE) 2.5 MG tablet Take 1 tablet (2.5 mg total) by mouth 2 (two) times daily with a meal. 01/17/22   [name], MD  pantoprazole (PROTONIX) 40 MG tablet Take 1 tablet (40 mg total) by mouth 2 (two) times daily. 08/13/21 08/13/22  [name], MD  prochlorperazine (COMPAZINE) 10 MG tablet TAKE 1 TABLET(10 MG) BY MOUTH EVERY 6 HOURS AS NEEDED FOR NAUSEA OR VOMITING 12/14/20   [name], MD  tamsulosin (FLOMAX) 0.4 MG CAPS capsule Take 0.4 mg by mouth daily.    [provider]  torsemide (DEMADEX) 20 MG tablet Take 20 mg  by mouth daily , may take an additional 20 mg if needed for swelling or dyspnea 01/17/22   [name], MD  XARELTO 20 MG TABS tablet Take 1 tablet (20 mg total) by mouth daily with supper. 08/19/21   [name], MD      Allergies    Aldactone [spironolactone]  Review of Systems   Review of Systems  All other systems reviewed and are negative.  Physical Exam Updated Vital Signs BP 107/70   Pulse 87   Temp 98 F (36.7 C) (Axillary)   Resp (!) 26   Ht '6\' 1"'$  (1.854 m)   Wt 99.8 kg   SpO2 94%   BMI 29.03 kg/m  Physical Exam Vitals and nursing note reviewed.  Constitutional:      Appearance: He is well-developed.  HENT:     Head: Atraumatic.  Eyes:     Extraocular Movements: Extraocular movements intact.     Pupils: Pupils are equal, round, and reactive to light.  Cardiovascular:     Rate and Rhythm: Normal rate. Rhythm irregular.     Heart sounds: Murmur heard.   Pulmonary:     Effort: Pulmonary effort is normal.  Musculoskeletal:     Cervical back: Neck supple.     Right lower leg: No edema.     Left lower leg: No edema.  Skin:    General: Skin is warm.  Neurological:     Mental Status: He is alert and oriented to person, place, and time.     Cranial Nerves: No cranial nerve deficit.     Sensory: No sensory deficit.     Motor: No weakness.     Coordination: Coordination normal.     Comments: Lower extremity strength is 4+ out of 5 bilaterally, no evidence of any pressure ulcers, upper extremity strength is 4+ out of 5 bilaterally and gross sensory exam for upper and lower extremity exam is normal    ED Results / Procedures / Treatments   Labs (all labs ordered are listed, but only abnormal results are displayed) Labs Reviewed  LACTIC ACID, PLASMA - Abnormal; Notable for the following components:      Result Value   Lactic Acid, Venous 6.3 (*)    All other components within normal limits  LACTIC ACID, PLASMA - Abnormal; Notable for the following components:   Lactic Acid, Venous 2.4 (*)    All other components within normal limits  COMPREHENSIVE METABOLIC PANEL - Abnormal; Notable for the following components:   Potassium 3.0 (*)    Glucose, Bld 185 (*)    BUN 26 (*)    Creatinine, Ser 2.13 (*)    Total Protein 6.3 (*)    Albumin 3.0 (*)    GFR, Estimated 31 (*)    All other components within normal limits  CBC WITH DIFFERENTIAL/PLATELET - Abnormal; Notable for the following components:   RBC 3.77 (*)    Hemoglobin 11.4 (*)    HCT 35.9 (*)    Lymphs Abs 0.5 (*)    All other components within normal limits  MAGNESIUM    EKG None  Radiology No results found.  Procedures Procedures    Medications Ordered in ED Medications  sodium chloride 0.9 % bolus 2,000 mL (0 mLs Intravenous Stopped 02/28/22 2129)    ED Course/ Medical Decision Making/ A&P                           Medical Decision Making Amount and/or  Complexity of Data Reviewed Labs: ordered.   This patient presents to the ED with chief complaint(s) of fall with generalized weakness, which is chronic with pertinent past medical history of atrial fibrillation, CHF, pulm hypertension, CKD which further complicates the presenting complaint.  Allegedly, patient had a syncopal episode when EMS were  lifting him.   He denies any chest pain, shortness of breath and no infection-like symptoms.  He indicates that he was having a normal day until he slipped, when trying to get up and fell.  From the trauma itself, he does not have any headache, neck pain and denies any active new focal neuro complaints such as one-sided weakness, numbness, vision change or dizziness.  Review of system is not truly indicated above fluid loss.  The differential diagnosis includes : Severe electrolyte abnormality, acute on chronic renal failure, dehydration. Patient denies any burning with urination, has no history of UTI therefore suspicion for UTI is low.  His neuro exam is nonfocal.  Strength is 4+ out of 5 for bilateral lower extremities.  Patient clearly indicates that he would not have come to the ED if it were not for the fact that he had fallen and was unable to get up.  He felt that the day was normal up until then.  He has generalized weakness and there is no new weakness today.  The initial plan is to get basic blood work-up, keep patient on cardiac monitoring and reassess.  We will also given 2 L of IV fluid, suspicion is high for some dehydration. Additionally, on patient's records it was noted that he also has urothelial carcinoma.  We will ensure kidney function is fine.  He is on Xarelto, has cancer history, nursing staff hesitant at in and out cath -considering risk versus benefit, in the setting of patient not having true UTI-like symptoms, we will not pursue it in and out cath for this patient, suspicion for UTI is low.  Additional history  obtained: Additional history obtained from EMS  Records reviewed previous admission documents and cardiology note.  Discharge summary from 2-28 reviewed  Independent labs interpretation:  The following labs were independently interpreted: Lactic acidosis in the setting of low blood pressure upon arrival, lactic acid was 6 at arrival.  White count is normal, potassium is slightly low.  Patient's renal function is at baseline.    Treatment and Reassessment: After initial work-up, patient was reassessed.  He still does not have tachycardia or fevers.  He feels better after IV fluids.  Plan is to repeat lactic acid, if the repeat lactate is reassuring and he is able to ambulate, then we will proceed with discharge.  Reassessment; Repeat lactate is better.  Patient feels that he is comfortable going home.  Tach has ambulated the patient.  We will discharge with strict ER return precautions.  We advised him to return to the ER if he starts having any fevers, new pain, blood in the urine, blood in the stool, increased weakness, any near fainting or fainting spells.   Consideration for admission or further workup: admission considered - however pt responded to ivf and his lactate cleared.  No clinical symptoms consistent with UTI.  No back pain, no abdominal pain.  Normal hemoglobin, no tachycardia, no fevers, no elevated white count.  Shared decision making with the patient.  He is comfortable going home.  Strict ER return precautions discussed.   Final Clinical Impression(s) / ED Diagnoses Final diagnoses:  Generalized weakness    Rx / DC Orders ED Discharge Orders     None         Varney Biles, MD 03/01/22 1620

## 2022-03-01 NOTE — ED Notes (Signed)
Pt assisted in pericare after having small BM. Discharge instructions reviewed, questions answered. Pt states understanding and no further questions. Pt going home with [facility] transport upon discharge. No s/s of distress noted.   Barbara, s/o, called and updated that pt will be on his way home shortly.

## 2022-03-03 DIAGNOSIS — J9611 Chronic respiratory failure with hypoxia: Secondary | ICD-10-CM | POA: Diagnosis not present

## 2022-03-03 DIAGNOSIS — I130 Hypertensive heart and chronic kidney disease with heart failure and stage 1 through stage 4 chronic kidney disease, or unspecified chronic kidney disease: Secondary | ICD-10-CM | POA: Diagnosis not present

## 2022-03-03 DIAGNOSIS — I272 Pulmonary hypertension, unspecified: Secondary | ICD-10-CM | POA: Diagnosis not present

## 2022-03-03 DIAGNOSIS — I5032 Chronic diastolic (congestive) heart failure: Secondary | ICD-10-CM | POA: Diagnosis not present

## 2022-03-03 DIAGNOSIS — C670 Malignant neoplasm of trigone of bladder: Secondary | ICD-10-CM | POA: Diagnosis not present

## 2022-03-03 DIAGNOSIS — N183 Chronic kidney disease, stage 3 unspecified: Secondary | ICD-10-CM | POA: Diagnosis not present

## 2022-03-04 DIAGNOSIS — C670 Malignant neoplasm of trigone of bladder: Secondary | ICD-10-CM | POA: Diagnosis not present

## 2022-03-04 DIAGNOSIS — J9611 Chronic respiratory failure with hypoxia: Secondary | ICD-10-CM | POA: Diagnosis not present

## 2022-03-04 DIAGNOSIS — I272 Pulmonary hypertension, unspecified: Secondary | ICD-10-CM | POA: Diagnosis not present

## 2022-03-04 DIAGNOSIS — I130 Hypertensive heart and chronic kidney disease with heart failure and stage 1 through stage 4 chronic kidney disease, or unspecified chronic kidney disease: Secondary | ICD-10-CM | POA: Diagnosis not present

## 2022-03-04 DIAGNOSIS — I5032 Chronic diastolic (congestive) heart failure: Secondary | ICD-10-CM | POA: Diagnosis not present

## 2022-03-04 DIAGNOSIS — N183 Chronic kidney disease, stage 3 unspecified: Secondary | ICD-10-CM | POA: Diagnosis not present

## 2022-03-08 ENCOUNTER — Inpatient Hospital Stay (HOSPITAL_COMMUNITY): Payer: Medicare Other

## 2022-03-08 ENCOUNTER — Other Ambulatory Visit: Payer: Self-pay

## 2022-03-08 ENCOUNTER — Inpatient Hospital Stay (HOSPITAL_COMMUNITY)
Admission: EM | Admit: 2022-03-08 | Discharge: 2022-03-11 | DRG: 840 | Disposition: A | Discharge status: Home Health | Payer: Medicare Other | Attending: Internal Medicine | Admitting: Internal Medicine

## 2022-03-08 ENCOUNTER — Encounter (HOSPITAL_COMMUNITY): Payer: Self-pay

## 2022-03-08 ENCOUNTER — Emergency Department (HOSPITAL_COMMUNITY): Payer: Medicare Other

## 2022-03-08 DIAGNOSIS — I5032 Chronic diastolic (congestive) heart failure: Secondary | ICD-10-CM | POA: Diagnosis present

## 2022-03-08 DIAGNOSIS — I4821 Permanent atrial fibrillation: Secondary | ICD-10-CM | POA: Diagnosis present

## 2022-03-08 DIAGNOSIS — Z993 Dependence on wheelchair: Secondary | ICD-10-CM | POA: Diagnosis not present

## 2022-03-08 DIAGNOSIS — I10 Essential (primary) hypertension: Secondary | ICD-10-CM | POA: Diagnosis present

## 2022-03-08 DIAGNOSIS — I130 Hypertensive heart and chronic kidney disease with heart failure and stage 1 through stage 4 chronic kidney disease, or unspecified chronic kidney disease: Secondary | ICD-10-CM | POA: Diagnosis present

## 2022-03-08 DIAGNOSIS — R000 Tachycardia, unspecified: Secondary | ICD-10-CM | POA: Diagnosis not present

## 2022-03-08 DIAGNOSIS — M1990 Unspecified osteoarthritis, unspecified site: Secondary | ICD-10-CM | POA: Diagnosis not present

## 2022-03-08 DIAGNOSIS — Z9989 Dependence on other enabling machines and devices: Secondary | ICD-10-CM

## 2022-03-08 DIAGNOSIS — I5023 Acute on chronic systolic (congestive) heart failure: Secondary | ICD-10-CM | POA: Diagnosis not present

## 2022-03-08 DIAGNOSIS — I878 Other specified disorders of veins: Secondary | ICD-10-CM | POA: Diagnosis not present

## 2022-03-08 DIAGNOSIS — Z79899 Other long term (current) drug therapy: Secondary | ICD-10-CM

## 2022-03-08 DIAGNOSIS — R918 Other nonspecific abnormal finding of lung field: Secondary | ICD-10-CM | POA: Diagnosis not present

## 2022-03-08 DIAGNOSIS — G4733 Obstructive sleep apnea (adult) (pediatric): Secondary | ICD-10-CM | POA: Diagnosis not present

## 2022-03-08 DIAGNOSIS — C771 Secondary and unspecified malignant neoplasm of intrathoracic lymph nodes: Principal | ICD-10-CM | POA: Diagnosis present

## 2022-03-08 DIAGNOSIS — E785 Hyperlipidemia, unspecified: Secondary | ICD-10-CM | POA: Diagnosis not present

## 2022-03-08 DIAGNOSIS — I9589 Other hypotension: Secondary | ICD-10-CM | POA: Diagnosis present

## 2022-03-08 DIAGNOSIS — Z6835 Body mass index (BMI) 35.0-35.9, adult: Secondary | ICD-10-CM | POA: Diagnosis not present

## 2022-03-08 DIAGNOSIS — R0902 Hypoxemia: Secondary | ICD-10-CM

## 2022-03-08 DIAGNOSIS — N1832 Chronic kidney disease, stage 3b: Secondary | ICD-10-CM | POA: Diagnosis present

## 2022-03-08 DIAGNOSIS — J9611 Chronic respiratory failure with hypoxia: Secondary | ICD-10-CM | POA: Diagnosis not present

## 2022-03-08 DIAGNOSIS — R531 Weakness: Secondary | ICD-10-CM | POA: Diagnosis not present

## 2022-03-08 DIAGNOSIS — Z888 Allergy status to other drugs, medicaments and biological substances status: Secondary | ICD-10-CM

## 2022-03-08 DIAGNOSIS — Z7401 Bed confinement status: Secondary | ICD-10-CM | POA: Diagnosis not present

## 2022-03-08 DIAGNOSIS — I5033 Acute on chronic diastolic (congestive) heart failure: Secondary | ICD-10-CM | POA: Diagnosis present

## 2022-03-08 DIAGNOSIS — I872 Venous insufficiency (chronic) (peripheral): Secondary | ICD-10-CM | POA: Diagnosis not present

## 2022-03-08 DIAGNOSIS — R778 Other specified abnormalities of plasma proteins: Secondary | ICD-10-CM

## 2022-03-08 DIAGNOSIS — I272 Pulmonary hypertension, unspecified: Secondary | ICD-10-CM | POA: Diagnosis not present

## 2022-03-08 DIAGNOSIS — I77811 Abdominal aortic ectasia: Secondary | ICD-10-CM | POA: Diagnosis present

## 2022-03-08 DIAGNOSIS — K219 Gastro-esophageal reflux disease without esophagitis: Secondary | ICD-10-CM | POA: Diagnosis present

## 2022-03-08 DIAGNOSIS — H9190 Unspecified hearing loss, unspecified ear: Secondary | ICD-10-CM | POA: Diagnosis not present

## 2022-03-08 DIAGNOSIS — J9601 Acute respiratory failure with hypoxia: Secondary | ICD-10-CM | POA: Diagnosis present

## 2022-03-08 DIAGNOSIS — R0602 Shortness of breath: Principal | ICD-10-CM

## 2022-03-08 DIAGNOSIS — D638 Anemia in other chronic diseases classified elsewhere: Secondary | ICD-10-CM | POA: Diagnosis present

## 2022-03-08 DIAGNOSIS — R54 Age-related physical debility: Secondary | ICD-10-CM | POA: Diagnosis not present

## 2022-03-08 DIAGNOSIS — R0689 Other abnormalities of breathing: Secondary | ICD-10-CM | POA: Diagnosis not present

## 2022-03-08 DIAGNOSIS — I700 Atherosclerosis of aorta: Secondary | ICD-10-CM | POA: Diagnosis not present

## 2022-03-08 DIAGNOSIS — I248 Other forms of acute ischemic heart disease: Secondary | ICD-10-CM | POA: Diagnosis present

## 2022-03-08 DIAGNOSIS — Z1152 Encounter for screening for COVID-19: Secondary | ICD-10-CM | POA: Diagnosis not present

## 2022-03-08 DIAGNOSIS — D631 Anemia in chronic kidney disease: Secondary | ICD-10-CM | POA: Diagnosis not present

## 2022-03-08 DIAGNOSIS — Z7901 Long term (current) use of anticoagulants: Secondary | ICD-10-CM | POA: Diagnosis not present

## 2022-03-08 DIAGNOSIS — Z9221 Personal history of antineoplastic chemotherapy: Secondary | ICD-10-CM

## 2022-03-08 DIAGNOSIS — Z86010 Personal history of colonic polyps: Secondary | ICD-10-CM

## 2022-03-08 DIAGNOSIS — R7989 Other specified abnormal findings of blood chemistry: Secondary | ICD-10-CM | POA: Diagnosis present

## 2022-03-08 DIAGNOSIS — R069 Unspecified abnormalities of breathing: Secondary | ICD-10-CM | POA: Diagnosis not present

## 2022-03-08 DIAGNOSIS — Z960 Presence of urogenital implants: Secondary | ICD-10-CM | POA: Diagnosis not present

## 2022-03-08 DIAGNOSIS — J90 Pleural effusion, not elsewhere classified: Secondary | ICD-10-CM

## 2022-03-08 DIAGNOSIS — Z87891 Personal history of nicotine dependence: Secondary | ICD-10-CM

## 2022-03-08 DIAGNOSIS — Z8551 Personal history of malignant neoplasm of bladder: Secondary | ICD-10-CM

## 2022-03-08 DIAGNOSIS — R911 Solitary pulmonary nodule: Secondary | ICD-10-CM | POA: Diagnosis not present

## 2022-03-08 DIAGNOSIS — J9811 Atelectasis: Secondary | ICD-10-CM | POA: Diagnosis not present

## 2022-03-08 DIAGNOSIS — E860 Dehydration: Secondary | ICD-10-CM | POA: Diagnosis present

## 2022-03-08 DIAGNOSIS — Z8249 Family history of ischemic heart disease and other diseases of the circulatory system: Secondary | ICD-10-CM

## 2022-03-08 DIAGNOSIS — J910 Malignant pleural effusion: Secondary | ICD-10-CM | POA: Diagnosis present

## 2022-03-08 DIAGNOSIS — E876 Hypokalemia: Secondary | ICD-10-CM | POA: Diagnosis present

## 2022-03-08 DIAGNOSIS — N183 Chronic kidney disease, stage 3 unspecified: Secondary | ICD-10-CM | POA: Diagnosis not present

## 2022-03-08 DIAGNOSIS — Z66 Do not resuscitate: Secondary | ICD-10-CM | POA: Diagnosis present

## 2022-03-08 DIAGNOSIS — M2550 Pain in unspecified joint: Secondary | ICD-10-CM | POA: Diagnosis not present

## 2022-03-08 DIAGNOSIS — C670 Malignant neoplasm of trigone of bladder: Secondary | ICD-10-CM | POA: Diagnosis not present

## 2022-03-08 DIAGNOSIS — Z6829 Body mass index (BMI) 29.0-29.9, adult: Secondary | ICD-10-CM

## 2022-03-08 DIAGNOSIS — M40209 Unspecified kyphosis, site unspecified: Secondary | ICD-10-CM | POA: Diagnosis present

## 2022-03-08 DIAGNOSIS — Z87442 Personal history of urinary calculi: Secondary | ICD-10-CM

## 2022-03-08 DIAGNOSIS — Z923 Personal history of irradiation: Secondary | ICD-10-CM

## 2022-03-08 DIAGNOSIS — I2510 Atherosclerotic heart disease of native coronary artery without angina pectoris: Secondary | ICD-10-CM | POA: Diagnosis present

## 2022-03-08 DIAGNOSIS — E43 Unspecified severe protein-calorie malnutrition: Secondary | ICD-10-CM | POA: Diagnosis present

## 2022-03-08 DIAGNOSIS — I509 Heart failure, unspecified: Secondary | ICD-10-CM

## 2022-03-08 DIAGNOSIS — Z800 Family history of malignant neoplasm of digestive organs: Secondary | ICD-10-CM

## 2022-03-08 DIAGNOSIS — J939 Pneumothorax, unspecified: Secondary | ICD-10-CM | POA: Diagnosis not present

## 2022-03-08 DIAGNOSIS — E669 Obesity, unspecified: Secondary | ICD-10-CM | POA: Diagnosis not present

## 2022-03-08 DIAGNOSIS — Z9181 History of falling: Secondary | ICD-10-CM | POA: Diagnosis not present

## 2022-03-08 DIAGNOSIS — C801 Malignant (primary) neoplasm, unspecified: Secondary | ICD-10-CM | POA: Diagnosis not present

## 2022-03-08 DIAGNOSIS — Z79891 Long term (current) use of opiate analgesic: Secondary | ICD-10-CM | POA: Diagnosis not present

## 2022-03-08 DIAGNOSIS — I5031 Acute diastolic (congestive) heart failure: Secondary | ICD-10-CM | POA: Diagnosis not present

## 2022-03-08 DIAGNOSIS — G8929 Other chronic pain: Secondary | ICD-10-CM | POA: Diagnosis not present

## 2022-03-08 DIAGNOSIS — Z8719 Personal history of other diseases of the digestive system: Secondary | ICD-10-CM

## 2022-03-08 LAB — BODY FLUID CELL COUNT WITH DIFFERENTIAL
Eos, Fluid: 0 %
Lymphs, Fluid: 20 %
Monocyte-Macrophage-Serous Fluid: 29 % — ABNORMAL LOW (ref 50–90)
Neutrophil Count, Fluid: 51 % — ABNORMAL HIGH (ref 0–25)
Total Nucleated Cell Count, Fluid: 310 cu mm (ref 0–1000)

## 2022-03-08 LAB — I-STAT VENOUS BLOOD GAS, ED
Acid-Base Excess: 9 mmol/L — ABNORMAL HIGH (ref 0.0–2.0)
Bicarbonate: 35.4 mmol/L — ABNORMAL HIGH (ref 20.0–28.0)
Calcium, Ion: 1.12 mmol/L — ABNORMAL LOW (ref 1.15–1.40)
HCT: 32 % — ABNORMAL LOW (ref 39.0–52.0)
Hemoglobin: 10.9 g/dL — ABNORMAL LOW (ref 13.0–17.0)
O2 Saturation: 84 %
Potassium: 3.2 mmol/L — ABNORMAL LOW (ref 3.5–5.1)
Sodium: 144 mmol/L (ref 135–145)
TCO2: 37 mmol/L — ABNORMAL HIGH (ref 22–32)
pCO2, Ven: 59 mmHg (ref 44–60)
pH, Ven: 7.386 (ref 7.25–7.43)
pO2, Ven: 52 mmHg — ABNORMAL HIGH (ref 32–45)

## 2022-03-08 LAB — CBC WITH DIFFERENTIAL/PLATELET
Abs Immature Granulocytes: 0.02 10*3/uL (ref 0.00–0.07)
Basophils Absolute: 0 10*3/uL (ref 0.0–0.1)
Basophils Relative: 1 %
Eosinophils Absolute: 0 10*3/uL (ref 0.0–0.5)
Eosinophils Relative: 0 %
HCT: 34.8 % — ABNORMAL LOW (ref 39.0–52.0)
Hemoglobin: 11 g/dL — ABNORMAL LOW (ref 13.0–17.0)
Immature Granulocytes: 0 %
Lymphocytes Relative: 6 %
Lymphs Abs: 0.4 10*3/uL — ABNORMAL LOW (ref 0.7–4.0)
MCH: 30.7 pg (ref 26.0–34.0)
MCHC: 31.6 g/dL (ref 30.0–36.0)
MCV: 97.2 fL (ref 80.0–100.0)
Monocytes Absolute: 0.6 10*3/uL (ref 0.1–1.0)
Monocytes Relative: 9 %
Neutro Abs: 5.5 10*3/uL (ref 1.7–7.7)
Neutrophils Relative %: 84 %
Platelets: 289 10*3/uL (ref 150–400)
RBC: 3.58 MIL/uL — ABNORMAL LOW (ref 4.22–5.81)
RDW: 14.8 % (ref 11.5–15.5)
WBC: 6.5 10*3/uL (ref 4.0–10.5)
nRBC: 0 % (ref 0.0–0.2)

## 2022-03-08 LAB — LACTATE DEHYDROGENASE, PLEURAL OR PERITONEAL FLUID: LD, Fluid: 229 U/L — ABNORMAL HIGH (ref 3–23)

## 2022-03-08 LAB — TROPONIN I (HIGH SENSITIVITY)
Troponin I (High Sensitivity): 21 ng/L — ABNORMAL HIGH (ref <18)
Troponin I (High Sensitivity): 21 ng/L — ABNORMAL HIGH (ref <18)

## 2022-03-08 LAB — SARS CORONAVIRUS 2 BY RT PCR: SARS Coronavirus 2 by RT PCR: NEGATIVE

## 2022-03-08 LAB — PROTIME-INR
INR: 1.5 — ABNORMAL HIGH (ref 0.8–1.2)
Prothrombin Time: 17.6 seconds — ABNORMAL HIGH (ref 11.4–15.2)

## 2022-03-08 LAB — COMPREHENSIVE METABOLIC PANEL
ALT: 9 U/L (ref 0–44)
AST: 13 U/L — ABNORMAL LOW (ref 15–41)
Albumin: 2.7 g/dL — ABNORMAL LOW (ref 3.5–5.0)
Alkaline Phosphatase: 76 U/L (ref 38–126)
Anion gap: 10 (ref 5–15)
BUN: 21 mg/dL (ref 8–23)
CO2: 32 mmol/L (ref 22–32)
Calcium: 8.7 mg/dL — ABNORMAL LOW (ref 8.9–10.3)
Chloride: 101 mmol/L (ref 98–111)
Creatinine, Ser: 1.79 mg/dL — ABNORMAL HIGH (ref 0.61–1.24)
GFR, Estimated: 38 mL/min — ABNORMAL LOW (ref >60)
Glucose, Bld: 146 mg/dL — ABNORMAL HIGH (ref 70–99)
Potassium: 3.3 mmol/L — ABNORMAL LOW (ref 3.5–5.1)
Sodium: 143 mmol/L (ref 135–145)
Total Bilirubin: 1.4 mg/dL — ABNORMAL HIGH (ref 0.3–1.2)
Total Protein: 6.1 g/dL — ABNORMAL LOW (ref 6.5–8.1)

## 2022-03-08 LAB — MAGNESIUM: Magnesium: 2.1 mg/dL (ref 1.7–2.4)

## 2022-03-08 LAB — PROTEIN, PLEURAL OR PERITONEAL FLUID: Total protein, fluid: <3 g/dL

## 2022-03-08 LAB — BRAIN NATRIURETIC PEPTIDE: B Natriuretic Peptide: 100.7 pg/mL — ABNORMAL HIGH (ref 0.0–100.0)

## 2022-03-08 LAB — TSH: TSH: 0.913 u[IU]/mL (ref 0.350–4.500)

## 2022-03-08 MED ORDER — TAMSULOSIN HCL 0.4 MG PO CAPS
0.4000 mg | ORAL_CAPSULE | Freq: Every day | ORAL | Status: DC
  Administered 2022-03-09 – 2022-03-11 (×3): 0.4 mg via ORAL
  Filled 2022-03-08 (×3): qty 1

## 2022-03-08 MED ORDER — ACETAMINOPHEN 650 MG RE SUPP: 650.0000 mg | Freq: Four times a day (QID) | RECTAL | Status: DC | PRN

## 2022-03-08 MED ORDER — SODIUM CHLORIDE 0.9% FLUSH
3.0000 mL | Freq: Two times a day (BID) | INTRAVENOUS | Status: DC
  Administered 2022-03-08 – 2022-03-11 (×6): 3 mL via INTRAVENOUS

## 2022-03-08 MED ORDER — ENSURE ENLIVE PO LIQD
237.0000 mL | Freq: Three times a day (TID) | ORAL | Status: DC
  Administered 2022-03-09 – 2022-03-11 (×8): 237 mL via ORAL
  Filled 2022-03-08 (×2): qty 237

## 2022-03-08 MED ORDER — SODIUM CHLORIDE 0.9% FLUSH: 3.0000 mL | INTRAVENOUS | Status: DC | PRN

## 2022-03-08 MED ORDER — PANTOPRAZOLE SODIUM 40 MG PO TBEC
40.0000 mg | DELAYED_RELEASE_TABLET | Freq: Two times a day (BID) | ORAL | Status: DC
  Administered 2022-03-08 – 2022-03-11 (×6): 40 mg via ORAL
  Filled 2022-03-08 (×6): qty 1

## 2022-03-08 MED ORDER — ATORVASTATIN CALCIUM 10 MG PO TABS
10.0000 mg | ORAL_TABLET | Freq: Every day | ORAL | Status: DC
  Administered 2022-03-08 – 2022-03-11 (×4): 10 mg via ORAL
  Filled 2022-03-08 (×4): qty 1

## 2022-03-08 MED ORDER — POTASSIUM CHLORIDE CRYS ER 20 MEQ PO TBCR
20.0000 meq | EXTENDED_RELEASE_TABLET | Freq: Once | ORAL | Status: AC
  Administered 2022-03-08: 20 meq via ORAL
  Filled 2022-03-08: qty 1

## 2022-03-08 MED ORDER — ACETAMINOPHEN 325 MG PO TABS: 650.0000 mg | ORAL_TABLET | Freq: Four times a day (QID) | ORAL | Status: DC | PRN

## 2022-03-08 MED ORDER — DOCUSATE SODIUM 100 MG PO CAPS
100.0000 mg | ORAL_CAPSULE | Freq: Two times a day (BID) | ORAL | Status: DC
Start: 2022-03-08 — End: 2022-03-09
  Administered 2022-03-08 – 2022-03-09 (×2): 100 mg via ORAL
  Filled 2022-03-08 (×2): qty 1

## 2022-03-08 MED ORDER — HYDROCODONE-ACETAMINOPHEN 5-325 MG PO TABS: 1.0000 | ORAL_TABLET | Freq: Four times a day (QID) | ORAL | Status: DC | PRN

## 2022-03-08 MED ORDER — FUROSEMIDE 10 MG/ML IJ SOLN
40.0000 mg | Freq: Once | INTRAMUSCULAR | Status: AC
  Administered 2022-03-08: 40 mg via INTRAVENOUS
  Filled 2022-03-08: qty 4

## 2022-03-08 MED ORDER — SODIUM CHLORIDE 0.9 % IV SOLN: 250.0000 mL | INTRAVENOUS | Status: DC | PRN

## 2022-03-08 MED ORDER — MIDODRINE HCL 5 MG PO TABS
2.5000 mg | ORAL_TABLET | Freq: Two times a day (BID) | ORAL | Status: DC
  Administered 2022-03-08 – 2022-03-09 (×2): 2.5 mg via ORAL
  Filled 2022-03-08 (×2): qty 1

## 2022-03-08 MED ORDER — POTASSIUM CHLORIDE CRYS ER 20 MEQ PO TBCR
40.0000 meq | EXTENDED_RELEASE_TABLET | Freq: Two times a day (BID) | ORAL | Status: DC
  Administered 2022-03-08 – 2022-03-09 (×2): 40 meq via ORAL
  Filled 2022-03-08 (×2): qty 2

## 2022-03-08 NOTE — Assessment & Plan Note (Signed)
Baseline creatinine has creeped up to around 2.0 over the past 5 months, hypotension likely has not helped this.  Close to his baseline today Watch closely with diuresis Trend

## 2022-03-08 NOTE — ED Notes (Signed)
Pt now on 2 L [place] per Admit MD.

## 2022-03-08 NOTE — Assessment & Plan Note (Signed)
At his baseline, continue to monitor

## 2022-03-08 NOTE — Assessment & Plan Note (Signed)
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  Follow-up evaluation in 2 years.

## 2022-03-08 NOTE — Assessment & Plan Note (Signed)
Troponin flat with no chest pain, not consistent with ACS ekg with no acute changes Likely demand ischemia in setting of acute respiratory failure with hypoxia Telemetry

## 2022-03-08 NOTE — Assessment & Plan Note (Addendum)
Followed by cardiology. Discussed RHC, but has not been medically stable enough to proceed with this.  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

## 2022-03-08 NOTE — Procedures (Signed)
Thoracentesis  Procedure Note  Dang Mathison  [id number]  [birth year]  Date:03/08/22  Time:4:33 PM   Provider Performing:Galaxy Borden C Tamala Julian   Procedure: Thoracentesis with imaging guidance (11552)  Indication(s) Pleural Effusion  Consent Risks of the procedure as well as the alternatives and risks of each were explained to the patient and/or caregiver.  Consent for the procedure was obtained and is signed in the bedside chart  Anesthesia Topical only with 1% lidocaine    Time Out Verified patient identification, verified procedure, site/side was marked, verified correct patient position, special equipment/implants available, medications/allergies/relevant history reviewed, required imaging and test results available.   Sterile Technique Maximal sterile technique including full sterile barrier drape, hand hygiene, sterile gown, sterile gloves, mask, hair covering, sterile ultrasound probe cover (if used).  Procedure Description Ultrasound was used to identify appropriate pleural anatomy for placement and overlying skin marked.  Area of drainage cleaned and draped in sterile fashion. Lidocaine was used to anesthetize the skin and subcutaneous tissue.  2100 cc's of bloody appearing fluid was drained from the left pleural space. Catheter then removed and bandaid applied to site.   Complications/Tolerance None; patient tolerated the procedure well. Chest X-ray is ordered to confirm no post-procedural complication.   EBL Minimal   Specimen(s) Pleural fluid

## 2022-03-08 NOTE — ED Triage Notes (Signed)
Pt BIBA from home c/o SOB x 2 days. Pt normally on RA but O2 sat was in the 80s. 4l [place] applied pt only up to the 90s. Non-rebreather placed. Pts O2 sat up to 97. In ER pt on  4L with an O2 sat of 96%. Pt denies CP.

## 2022-03-08 NOTE — ED Provider Notes (Signed)
I provided a substantive portion of the care of this patient.  I personally performed the entirety of the history, exam, and medical decision making for this encounter.  EKG Interpretation  Date/Time:  Tuesday March 08 2022 11:42:48 EDT Ventricular Rate:  108 PR Interval:    QRS Duration: 80 QT Interval:  360 QTC Calculation: 483 R Axis:   39 Text Interpretation: Atrial fibrillation Low voltage, precordial leads Repol abnrm suggests ischemia, anterolateral Confirmed by Fredia Sorrow [phone]) on 03/08/2022 11:59:01 AM  Patient seen by me along with the physician assistant.  Patient states he has had shortness of breath for several days.  But got worse today.  Also having difficulty eating.  Does have a home health care person that takes care of him at home.  EMS was called out for shortness of breath for 2 days.  Room air sats were in the 80s 4 L nasal cannula applied patient came up to the 90s.  Low 90s.  They placed him on a nonrebreather.  Patient now on 4 L of oxygen with an oxygen sat of 96%.  Patient denies any chest pain just the shortness of breath.  Labs without significant abnormalities.  CBCs white count 6.5 hemoglobin 11.0.  Patient's potassium down a little bit at 3.2 liver function test total bili is 1.4 GFR 38 initial troponin 21 BNP up a little bit at 100.7.  Chest x-ray showed left moderately large pleural effusion and small right pleural effusion.  This probably explains his shortness of breath.  Could have some component of congestive heart failure.  Also initial troponin elevated some will need a delta troponin.  The patient without any complaint of chest pain.  Patient will need some supplementation for the potassium we can give some Lasix.  Patient will need admission because he has an oxygen requirement.   Fredia Sorrow, MD 03/08/22 1349

## 2022-03-08 NOTE — Assessment & Plan Note (Addendum)
+  orthopnea, +jvd, +effusions and respiratory failure with hypoxia with elevated BNP in setting of reduced diuretics due to hypotension/dehydration.  -given '40mg'$  of lasix in ED. hesitant to give more with hypotension. On '20mg'$  at home. Has not taken any pm dose.  -watch I/O and adjust as needed  -follow renal function  -daily weights -repeat echo pending. Last echo in 07/2021 normal EF. Diastolic indeterminate. There is the interventricular septum is  flattened in systole and diastole, consistent with right ventricular pressure and volume overload.  -thoracentesis today with PCCM  -possibly cards consult once echo results

## 2022-03-08 NOTE — Assessment & Plan Note (Signed)
Much improved per patient and out patient notes TED hose while inpatient, hard for him to put on at home

## 2022-03-08 NOTE — Assessment & Plan Note (Signed)
Continue lipitor  ?

## 2022-03-08 NOTE — Consult Note (Signed)
NAME:  Patrick Bishop, MRN:  [id number], DOB:  1941-08-02, LOS: 0 ADMISSION DATE:  03/08/2022, CONSULTATION DATE:  03/08/22 REFERRING MD:  Rogers Blocker, CHIEF COMPLAINT:  SOB   History of Present Illness:  81 year old man with 60 pack year smoking hx, prior bladder cancer, OSA, dHF presenting with insidious onset DOE, orthopnea.  +associated dry cough.  No preceding illnesses.  No concurrent fevers.  No hx of chest wall trauma.  + weight loss 100 lbs over past year he states intentional. No chest pain.  CXR with mostly L sided effusion new from April 2023.  PCCM consulted for further management.    Pertinent  Medical History  AAA Arthritis Bladder cancer OSA on CPAP HTN Mild post COVID fibrosis  Significant Hospital Events: Including procedures, antibiotic start and stop dates in addition to other pertinent events   6/13 admitted  Interim History / Subjective:  Consulted  Objective   Blood pressure 115/71, pulse 86, temperature 97.8 F (36.6 C), temperature source Oral, resp. rate (!) 23, height '6\' 1"'$  (1.854 m), weight 99.8 kg, SpO2 93 %.       No intake or output data in the 24 hours ending 03/08/22 1640 Filed Weights   03/08/22 1159  Weight: 99.8 kg    Examination: General: Frail elderly man in NAD HENT: MMM, trachea midline Lungs: Diminished L base with complex density fluid on [place] Cardiovascular: Irregular, ext warm Abdomen: soft, +BS Extremities: Chronic venous stasis changes with minimal overlying edema Neuro: moves all 4 ext to command but profoundly weak; very hard of hearing Skin: scattered bruising MSK: he has some kyphosis and some sort of chest wall deformity along L shoulder  Resolved Hospital Problem list   N/A  Assessment & Plan:  New unilateral L effusion- bloody on tap.  Differential is trauma (denies), infectious (denies), malignant.  Weight loss of 100 lbs is a bit ominous but states this was intentional. - Tapped, sent for usual studies - Wean o2 for  sats > 90 % - Hold xarelto for now - Check CT chest - Will follow  Best Practice (right click and "Reselect all SmartList Selections" daily)  Per primary  Labs   CBC: Recent Labs  Lab 03/08/22 1226  WBC 6.5  NEUTROABS 5.5  HGB 11.0*  HCT 34.8*  MCV 97.2  PLT 573    Basic Metabolic Panel: Recent Labs  Lab 03/08/22 1226 03/08/22 1447  NA 143  --   K 3.3*  --   CL 101  --   CO2 32  --   GLUCOSE 146*  --   BUN 21  --   CREATININE 1.79*  --   CALCIUM 8.7*  --   MG  --  2.1   GFR: Estimated Creatinine Clearance: 40.2 mL/min (A) (by C-G formula based on SCr of 1.79 mg/dL (H)). Recent Labs  Lab 03/08/22 1226  WBC 6.5    Liver Function Tests: Recent Labs  Lab 03/08/22 1226  AST 13*  ALT 9  ALKPHOS 76  BILITOT 1.4*  PROT 6.1*  ALBUMIN 2.7*   No results for input(s): "LIPASE", "AMYLASE" in the last 168 hours. No results for input(s): "AMMONIA" in the last 168 hours.  ABG No results found for: "PHART", "PCO2ART", "PO2ART", "HCO3", "TCO2", "ACIDBASEDEF", "O2SAT"   Coagulation Profile: No results for input(s): "INR", "PROTIME" in the last 168 hours.  Cardiac Enzymes: No results for input(s): "CKTOTAL", "CKMB", "CKMBINDEX", "TROPONINI" in the last 168 hours.  HbA1C: Hgb A1c MFr Bld  Date/Time Value Ref Range Status  09/17/2021 03:12 AM 5.4 4.8 - 5.6 % Final    Comment:    (NOTE) Pre diabetes:          5.7%-6.4%  Diabetes:              >6.4%  Glycemic control for   <7.0% adults with diabetes   10/15/2020 02:55 PM 5.4 4.8 - 5.6 % Final    Comment:    (NOTE) Pre diabetes:          5.7%-6.4%  Diabetes:              >6.4%  Glycemic control for   <7.0% adults with diabetes     CBG: No results for input(s): "GLUCAP" in the last 168 hours.  Review of Systems:    Positive Symptoms in bold:  Constitutional fevers, chills, weight loss, fatigue, anorexia, malaise  Eyes decreased vision, double vision, eye irritation  Ears, Nose, Mouth, Throat  sore throat, trouble swallowing, sinus congestion  Cardiovascular chest pain, paroxysmal nocturnal dyspnea, lower ext edema, palpitations   Respiratory SOB, cough, DOE, hemoptysis, wheezing  Gastrointestinal nausea, vomiting, diarrhea  Genitourinary burning with urination, trouble urinating  Musculoskeletal joint aches, joint swelling, back pain  Integumentary  rashes, skin lesions  Neurological focal weakness, focal numbness, trouble speaking, headaches  Psychiatric depression, anxiety, confusion  Endocrine polyuria, polydipsia, cold intolerance, heat intolerance  Hematologic abnormal bruising, abnormal bleeding, unexplained nose bleeds  Allergic/Immunologic recurrent infections, hives, swollen lymph nodes     Past Medical History:  He,  has a past medical history of AAA (abdominal aortic aneurysm) ([place]), Arthritis, Bowel and bladder incontinence, 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   Surgical History:   Past Surgical History:  Procedure Laterality Date   ABDOMINAL VENOUS DUPLEX Bilateral 07/2014   ([facility]: Dr. Cleda Mccreedy): (prior to Bilateral Iliac-Femoral Venous Stent Placement): Patent IVC.  No DVT B/L Iliac or Com fem V..  +++ DEEP V REFLUX B/L ILIAC & FEM V.  Absent Superficial V Reflux B/L x L SSV (too tortuous for intervention).  BL GSV occluded - prior ablation.    ABDOMINAL VENOUS DUPLEX Bilateral 11/2014   Absent DVT.  Left common femoral venous reflux: 1508 MS left common femoral vein, 2284MS left proximal femoral vein -> cystic structure  noted in the left popliteal vein (7.6 x 1.4 x 3.8 cm).;  Right leg deep venous reflux noted in the distal iliac, common femoral, proximal profunda femoral, proximal superficial femoral, superficial femoral and popliteal vein.   CARDIAC CATHETERIZATION  10/2009   [facility] - Dr. 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  EF~45%.  Mild pulmonary pretension.   CARDIAC PET  08/2009   Inferolateral partially reversible defect -- > false positive by cath   COLONOSCOPY     CYSTOSCOPY W/ URETERAL STENT PLACEMENT Left 01/19/2021   Procedure: CYSTOSCOPY WITH STENT EXCHANGE;  Surgeon: Ceasar Mons, MD;  Location: [facility];  Service: Urology;  Laterality: Left;  ONLY NEEDS 30 MIN   CYSTOSCOPY W/ URETERAL STENT PLACEMENT Left 11/05/2021   Procedure: CYSTOSCOPY WITH STENT EXCHANGE/ RETROGRADE;  Surgeon: Ceasar Mons, MD;  Location: [facility];  Service: Urology;  Laterality: Left;  ONLY NEEDS 30 MIN left stent removed   CYSTOSCOPY WITH STENT PLACEMENT Left 05/12/2021   Procedure: CYSTOSCOPY WITH LEFT STENT EXCHANGE;  Surgeon: Ceasar Mons, MD;  Location: [facility];  Service: Urology;  Laterality: Left;  ONLY NEEDS 30 MIN   CYSTOSCOPY/URETEROSCOPY/HOLMIUM LASER/STENT PLACEMENT Left 11/06/2019   Procedure: CYSTOSCOPY/RETROGRADE/URETEROSCOPY/HOLMIUM LASER/STENT PLACEMENT. REMOVAL NEPHROSCOPYTUBE;  Surgeon: Ceasar Mons, MD;  Location: [facility];  Service: Urology;  Laterality: Left;   ESOPHAGOGASTRODUODENOSCOPY     ESOPHAGOGASTRODUODENOSCOPY N/A 08/11/2021   Procedure: ESOPHAGOGASTRODUODENOSCOPY (EGD);  Surgeon: Arta Silence, MD;  Location: [facility];  Service: Endoscopy;  Laterality: N/A;   GREATER SAPHENOUS VEIN ABLATION Bilateral Before 2015   [facility] (Dr. Eugenie Filler)   IR NEPHROSTOMY EXCHANGE LEFT  08/28/2019   IR NEPHROSTOMY PLACEMENT LEFT  06/24/2019   IR TRANSCATH PLC STENT  EA ADD VEIN   INC ANGIOPLASTY Left 10/2014   ([place], Dr. 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 Stormy Fabian): US Guided --> R Common & External Iliac, Common Femoral Venography with Placement of 24 mmx 70 mm Stent - R Com Iliac-Ext Iliac- & Com Fem V. reducing 60-80% venous compression to ~0%).;; also noted significant L-sided venous compression (staged stenting).    LIPOMA EXCISION     x 2 came back again   NM MYOVIEW LTD  11/2005   Adenosine Myoview [facility] - Dr. Cleda Mccreedy): Normal myocardial perfusion scan. No ischemia or infarction.   TRANSTHORACIC ECHOCARDIOGRAM  04/26/2017    [facility] - Dr. Cleda Mccreedy) - report not available.  Per clinic note: Normal global LV function. Mild concentric temperature. Mild TR. Mild pulmonary hypertension.;;    TRANSTHORACIC ECHOCARDIOGRAM  7/'15; 7/'17   [facility] - Dr. Saverio Danker) a) low normal LV function (EF 56%). Moderate concentric LVH. Mild LA dilation. Normal pulmonary pressures.  ;; b) normal global function - EF 60-65%. Moderate pulmonary hypertension. Biatrial enlargement. Moderate concentric LVH.   TRANSTHORACIC ECHOCARDIOGRAM  02/2010   [facility] - Dr. Saverio Danker): Normal global LV function. Mild LVH. EF 60%.; November 2010. Moderate LV dilation. Moderate LVH. Normal EF 50-55%. Mild to moderate pulmonary hypertension. (July 2012 showed normal function with mild LVH, mild MR noted in August 2014)   TRANSTHORACIC ECHOCARDIOGRAM  05/2019   During admission for UTI, sepsis with A. fib RVR: LV normal size and function.  EF 60-65%.  Mildly reduced RV size with severely reduced function.  Despite this normal atrial size noted with just  only mild right atrial enlargement with elevated RAP (dilated IVC).  Relatively poor TR jet, but findings do suggest elevated RV pressures.   TRANSTHORACIC ECHOCARDIOGRAM  08/10/2021   LVEF 60 to 65%.  No RWMA.  Mild concentric LVH.  Indeterminate diastolic function.  Flattened IVS in both systole and diastole consistent with RV pressure volume overload.  Severely enlarged RV.  Moderate LA dilation and severe RA dilation.  PR noted, difficult to assess.   TRANSURETHRAL RESECTION OF BLADDER TUMOR Left 10/21/2020   Procedure: TRANSURETHRAL RESECTION OF BLADDER TUMOR (TURBT)/ RETROGRADE/  URETERAL STENT PLACEMENT;  Surgeon: Ceasar Mons, MD;  Location: [facility];  Service: Urology;  Laterality: Left;     Social History:   reports that he quit smoking about 27 years ago. His smoking use included cigarettes. He has a 35.00 pack-year smoking history. He has never used smokeless tobacco. He reports current alcohol use. He reports that he does not use drugs.   Family History:  His family history includes Cancer in his sister; Colon cancer in his mother and sister; Heart attack (age of onset: 13) in his father; Hypertension in his brother. There is no history of Other.   Allergies Allergies  Allergen Reactions   Aldactone [Spironolactone] Other (See Comments)    Caused dizziness     Home Medications  Prior to Admission medications   Medication Sig Start Date End Date Taking? Authorizing Provider  acetaminophen (TYLENOL) 325 MG tablet Take 2 tablets (650 mg total) by mouth every 6 (six) hours as needed for mild pain (or Fever >/= 101). 09/21/21   Danford, Suann Larry, MD  atorvastatin (LIPITOR) 10 MG tablet TAKE 1 TABLET DAILY Patient taking differently: Take 10 mg by mouth daily. 01/24/22   [name], MD  docusate sodium (COLACE) 100 MG capsule Take 1 capsule (100 mg total) by mouth 2 (two) times daily. 08/13/21   [name], MD  feeding supplement (ENSURE ENLIVE / ENSURE PLUS)  LIQD Take 237 mLs by mouth 3 (three) times daily between meals. 09/21/21   Danford, Suann Larry, MD  HYDROcodone-acetaminophen (NORCO/VICODIN) 5-325 MG tablet Take 1 tablet by mouth every 6 (six) hours as needed for moderate pain. 09/21/21   Danford, Suann Larry, MD  midodrine (PROAMATINE) 2.5 MG tablet Take 1 tablet (2.5 mg total) by mouth 2 (two) times daily with a meal. 01/17/22   [name], MD  pantoprazole (PROTONIX) 40 MG tablet Take 1 tablet (40 mg total) by mouth 2 (two) times daily. 08/13/21 08/13/22  [name], MD  prochlorperazine (COMPAZINE) 10 MG tablet TAKE 1 TABLET(10 MG) BY MOUTH EVERY 6 HOURS AS NEEDED FOR NAUSEA OR VOMITING Patient taking differently: Take 10 mg by mouth every 6 (six) hours as needed for vomiting or nausea. 12/14/20   [name], MD  tamsulosin (FLOMAX) 0.4 MG CAPS capsule Take 0.4 mg by mouth daily.    [provider]  torsemide (DEMADEX) 20 MG tablet Take 20 mg  by mouth daily , may take an additional 20 mg if needed for swelling or dyspnea 01/17/22   [name], MD  XARELTO 20 MG TABS tablet Take 1 tablet (20 mg total) by mouth daily with supper. 08/19/21   [name], MD     Critical care time: N/A

## 2022-03-08 NOTE — H&P (Addendum)
History and Physical    Patient: Patrick Bishop WVP:[id number] DOB: Feb 22, 1941 DOA: 03/08/2022 DOS: the patient was seen and examined on 03/08/2022 PCP: Lujean Amel, MD  Patient coming from: Home - lives at [facility] with his girlfriend. In [place].    Chief Complaint: shortness of breath   HPI: Patrick Bishop is a 81 y.o. 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  His girlfriend states he has had some shortness of breath over the last few few days, but got acutely worse this AM. He was gasping for air, but was able to talk in full sentences. She states they argued most of morning because he didn't want to come to hospital and she finally called EMS. She states he has hardly eaten for weeks and has been in bed. She states he has lost over 100 pounds over the past 6 months. They deny any swelling in legs and state his legs have actually had improvement in the swelling. NO coughing. He does endorse orthopnea. He does not eat much salt. No recent illness. He has not felt like he needed any extra demadex.   Back in April his cardiologist reduced his torsemide to once daily and only used second dose PRN, was '20mg'$  BID scheduled. Also weaned off of his metoprolol due to hypotension.    Denies any fever/chills, vision changes/headaches, chest pain or palpitations, cough, abdominal pain, N/V/D, dysuria or leg swelling.    He does not smoke or drink alcohol.    ER Course:  vitals: afebrile, bp: 104/71, HR; 111, RR: 23, oxygen: 85% RA>97% [place] on 4L,  Pertinent labs: hgb: 11.0, potassium: 3.3, creatinine: 1.79, troponin 21> 21 , BNP 100,  CXR: moderately large left pleural effusion, new. Small right pleural effusion.  In ED: given 31mq lasix and potassium. TRH  asked to admit.     Review of Systems: As mentioned in the history of present illness. All other systems reviewed and are negative. 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  Also s/p Bilateral GSV Ablation.   Dizziness    Dyspnea    Essential hypertension    History of kidney stones    Hx of adenomatous colonic polyps    Hyperlipidemia with target LDL less than 100    Longstanding persistent atrial fibrillation ([place]    Nephrolithiasis 05/2019   CKD stage 3   Obesity    OSA on CPAP    does not cpap    Pneumonia 08/2019   Pre-diabetes    Pulmonary hypertension ([place]    Related to OSA, partial component of HFpEF   Pyelonephritis 05/2019   Tumor    Left arm   Past Surgical History:  Procedure Laterality Date   ABDOMINAL VENOUS DUPLEX Bilateral 07/2014   ([facility] Dr. KCleda Mccreedy: (prior to Bilateral Iliac-Femoral Venous Stent Placement): Patent IVC.  No DVT  B/L Iliac or Com fem V..  +++ DEEP V REFLUX B/L ILIAC & FEM V.  Absent Superficial V Reflux B/L x L SSV (too tortuous for intervention).  BL GSV occluded - prior ablation.    ABDOMINAL VENOUS DUPLEX Bilateral 11/2014   Absent DVT.  Left common femoral venous reflux: 1508 MS left common femoral vein, 2284MS left proximal femoral vein -> cystic structure noted in the left popliteal vein (7.6 x 1.4 x 3.8 cm).;  Right leg deep venous reflux noted in the distal iliac, common femoral, proximal profunda femoral, proximal superficial femoral, superficial femoral and popliteal vein.   CARDIAC CATHETERIZATION  10/2009   [facility] - Dr. Saverio Danker): In  response to abnormal cardiac PET --> mild nonobstructive CAD: 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  EF~45%.  Mild pulmonary pretension.   CARDIAC PET  08/2009   Inferolateral partially reversible defect -- > false positive by cath   COLONOSCOPY     CYSTOSCOPY W/ URETERAL STENT PLACEMENT Left 01/19/2021   Procedure: CYSTOSCOPY WITH STENT EXCHANGE;  Surgeon: Ceasar Mons, MD;  [facility];  Service: Urology;  Laterality: Left;  ONLY NEEDS 30 MIN   CYSTOSCOPY W/ URETERAL STENT PLACEMENT Left 11/05/2021   Procedure: CYSTOSCOPY WITH STENT EXCHANGE/ RETROGRADE;  Surgeon: Ceasar Mons, MD;  [facility];  Service: Urology;  Laterality: Left;  ONLY NEEDS 30 MIN left stent removed   CYSTOSCOPY WITH STENT PLACEMENT Left 05/12/2021   Procedure: CYSTOSCOPY WITH LEFT STENT EXCHANGE;  Surgeon: Ceasar Mons, MD;  [facility];  Service: Urology;  Laterality: Left;  ONLY NEEDS 30 MIN   CYSTOSCOPY/URETEROSCOPY/HOLMIUM LASER/STENT PLACEMENT Left 11/06/2019   Procedure: CYSTOSCOPY/RETROGRADE/URETEROSCOPY/HOLMIUM LASER/STENT PLACEMENT. REMOVAL NEPHROSCOPYTUBE;  Surgeon: Ceasar Mons, MD;  Location[facility];  Service: Urology;  Laterality: Left;   ESOPHAGOGASTRODUODENOSCOPY     ESOPHAGOGASTRODUODENOSCOPY N/A 08/11/2021   Procedure: ESOPHAGOGASTRODUODENOSCOPY (EGD);  Surgeon: Arta Silence, MD;  Location: [facility];  Service: Endoscopy;  Laterality: N/A;   GREATER SAPHENOUS VEIN ABLATION Bilateral Before 2015   [facility] (Dr. Eugenie Filler)   IR NEPHROSTOMY EXCHANGE LEFT  08/28/2019   IR NEPHROSTOMY PLACEMENT LEFT  06/24/2019   IR TRANSCATH PLC STENT  EA ADD VEIN  INC ANGIOPLASTY Left 10/2014   ([place], Dr. 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 Stormy Fabian): US Guided --> R Common & External Iliac, Common Femoral Venography with Placement of 24 mmx 70 mm Stent - R Com Iliac-Ext Iliac- & Com Fem V. reducing 60-80% venous compression to ~0%).;; also noted significant L-sided venous compression (staged stenting).    LIPOMA EXCISION     x 2 came back again   NM MYOVIEW LTD  11/2005   [facility] - Dr. Cleda Mccreedy): Normal myocardial perfusion scan. No ischemia or infarction.   TRANSTHORACIC ECHOCARDIOGRAM  04/26/2017    [facility] - Dr. Cleda Mccreedy) - report not available.  Per clinic note: Normal global LV function. Mild concentric temperature. Mild TR. Mild pulmonary hypertension.;;    TRANSTHORACIC ECHOCARDIOGRAM  7/'15; 7/'17   [facility] - Dr. Saverio Danker) a) low normal LV function (EF 56%). Moderate concentric LVH. Mild LA dilation. Normal  pulmonary pressures.  ;; b) normal global function - EF 60-65%. Moderate pulmonary hypertension. Biatrial enlargement. Moderate concentric LVH.   TRANSTHORACIC ECHOCARDIOGRAM  02/2010   [facility] - Dr. Saverio Danker): Normal global LV function. Mild LVH. EF 60%.; November 2010. Moderate LV dilation. Moderate LVH. Normal EF 50-55%. Mild to moderate pulmonary hypertension. (July 2012 showed normal function with mild LVH, mild MR noted in August 2014)   TRANSTHORACIC ECHOCARDIOGRAM  05/2019   During admission for UTI, sepsis with A. fib RVR: LV normal size and function.  EF 60-65%.  Mildly reduced RV size with severely reduced function.  Despite this normal atrial size noted with just only mild right atrial enlargement with elevated RAP (dilated IVC).  Relatively poor TR jet, but findings do suggest elevated RV pressures.   TRANSTHORACIC ECHOCARDIOGRAM  08/10/2021   LVEF 60 to 65%.  No RWMA.  Mild concentric LVH.  Indeterminate diastolic function.  Flattened IVS in both systole  and diastole consistent with RV pressure volume overload.  Severely enlarged RV.  Moderate LA dilation and severe RA dilation.  PR noted, difficult to assess.   TRANSURETHRAL RESECTION OF BLADDER TUMOR Left 10/21/2020   Procedure: TRANSURETHRAL RESECTION OF BLADDER TUMOR (TURBT)/ RETROGRADE/  URETERAL STENT PLACEMENT;  Surgeon: Ceasar Mons, MD;  [facility];  Service: Urology;  Laterality: Left;   Social History:  reports that he quit smoking about 27 years ago. His smoking use included cigarettes. He has a 35.00 pack-year smoking history. He has never used smokeless tobacco. He reports current alcohol use. He reports that he does not use drugs.  Allergies  Allergen Reactions   Aldactone [Spironolactone] Other (See Comments)    Caused dizziness    Family History  Problem Relation Age of Onset   Colon cancer Mother    Heart attack Father 3   Hypertension Brother    Cancer Sister    Colon cancer Sister    Other Neg Hx        He really does not know much about his parents and siblings health, but they did not speak about healthcare    Prior to Admission medications   Medication Sig Start Date End Date Taking? Authorizing Provider  atorvastatin (LIPITOR) 10 MG tablet TAKE 1 TABLET DAILY 01/24/22  Yes [name], MD  HYDROcodone-acetaminophen (NORCO/VICODIN) 5-325 MG tablet Take 1 tablet by mouth every 6 (six) hours as needed for moderate pain. 09/21/21  Yes Danford, Suann Larry, MD  tamsulosin (FLOMAX) 0.4 MG CAPS capsule Take 0.4 mg by mouth daily.   Yes [provider]  torsemide (DEMADEX) 20 MG tablet Take 20 mg  by mouth daily , may take an additional 20 mg if needed for swelling or dyspnea 01/17/22  Yes [name], MD  XARELTO 20 MG TABS tablet Take 1 tablet (20 mg total) by mouth daily with supper. 08/19/21  Yes [name], MD  acetaminophen (TYLENOL) 325 MG tablet Take 2 tablets (650 mg total) by mouth every 6 (six) hours as needed for mild  pain (or Fever >/= 101). Patient not taking: Reported on 11/02/2021 09/21/21   [name], MD  docusate sodium (COLACE) 100 MG capsule Take 1 capsule (100 mg total) by mouth 2 (two) times daily. Patient not taking: Reported on 03/08/2022 08/13/21   [name], MD  feeding supplement (ENSURE ENLIVE / ENSURE PLUS) LIQD Take 237 mLs by mouth 3 (three) times daily between meals. Patient not taking: Reported on 03/08/2022 09/21/21  Danford, Suann Larry, MD  midodrine (PROAMATINE) 2.5 MG tablet Take 1 tablet (2.5 mg total) by mouth 2 (two) times daily with a meal. 01/17/22   [name], MD  pantoprazole (PROTONIX) 40 MG tablet Take 1 tablet (40 mg total) by mouth 2 (two) times daily. Patient not taking: Reported on 03/08/2022 08/13/21 08/13/22  [name], MD  prochlorperazine (COMPAZINE) 10 MG tablet TAKE 1 TABLET(10 MG) BY MOUTH EVERY 6 HOURS AS NEEDED FOR NAUSEA OR VOMITING Patient not taking: Reported on 03/08/2022 12/14/20   [name], MD    Physical Exam: Vitals:   03/08/22 1159 03/08/22 1245 03/08/22 1400 03/08/22 1430  BP:  110/75 101/62 115/71  Pulse:  (!) 103 (!) 106 86  Resp:  (!) 26 (!) 21 (!) 23  Temp:      TempSrc:      SpO2:  97% 95% 93%  Weight: 99.8 kg     Height: '6\' 1"'$  (1.854 m)      General:  Appears calm and comfortable and is in NAD. Mildly dyspneic  Eyes:  PERRL, EOMI, normal lids, iris ENT:  hard of hearing, lips & tongue, mmm; appropriate dentition Neck:  no LAD, masses or thyromegaly; no carotid bruits, +JVD Cardiovascular:  irregularly, irregular.  no m/r/g. +BLE edema.  Respiratory:  decreased breath sound in left mid to lower lung. Otherwise no crackles or wheezing.  Dyspneic with breathing/talking. Can talk in full sentences. Accessory muscle use.  Abdomen:  soft, NT, ND, NABS Back:   normal alignment, no CVAT Skin:  no rash or induration seen on limited exam. BLE with purple color from chronic venous stasis. Dry feet.   Musculoskeletal:  grossly normal tone BUE/BLE, good ROM, no bony abnormality Lower extremity:   Limited foot exam with no ulcerations.  2+ distal pulses. Psychiatric:  grossly normal mood and affect, speech fluent and appropriate, AO to self and place, did not know date. Abnormal for him per significant other  Neurologic:  CN 2-12 grossly intact, moves all extremities in coordinated fashion, sensation intact   Radiological Exams on Admission: Independently reviewed - see discussion in A/P where applicable  DG Chest Portable 1 View  Result Date: 03/08/2022 CLINICAL DATA:  SOB EXAM: PORTABLE CHEST 1 VIEW COMPARISON:  January 03, 2022 FINDINGS: There is moderately large left pleural effusion seen and is new. Likely left basilar atelectasis. Elevation of the right hemidiaphragm. Small right pleural effusion. Minor right basilar atelectasis. Bony thorax is unremarkable. IMPRESSION: Moderately large left pleural effusion and is new. Small right pleural effusion. Bibasilar atelectasis. Electronically Signed   By: Frazier Richards M.D.   On: 03/08/2022 12:20    EKG: Independently reviewed.  Atrial fibrillation  with rate 108; nonspecific ST changes with no evidence of acute ischemia   Labs on Admission: I have personally reviewed the available labs and imaging studies at the time of the admission.  Pertinent labs:    hgb: 11.0,  potassium: 3.3,  creatinine: 1.79,  troponin 21>   ,  BNP 100,   Assessment and Plan: Active Problems:   Acute respiratory failure with hypoxia with large left pleural effusion    Acute on chronic diastolic CHF (congestive heart failure) (HCC)   Hypokalemia   Elevated troponin   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HCC)   Permanent atrial fibrillation (HCC); CHA2DS2-VASc score =5 (agex2, aortic plaque, CHF, HTN)   Anemia of chronic disease   Pulmonary hypertension ([place])   Hyperlipidemia with target LDL less than  100   Ectatic abdominal aorta (HCC)   OSA on CPAP    Pleural effusion, left   Chronic venous stasis    Assessment and Plan: Acute respiratory failure with hypoxia with large left pleural effusion  81 year old presenting to ED with shortness of breath and acute hypoxic respiratory failure to 80% on RA with EMS and NRB placed. Found to have large left pleural effusion and small right pleural effusion  -admit to progressive -large effusion likely driving his respiratory failure -consulted PCCM to do thoracentesis today as he is still working to breath on 4L, although maintaining saturation>per PCCM 2100cc of bloody fluid. Checking CT, hold xarelto per PCCM  -check fluid studies. Hx of urothelial cancer  -appears to be in acute on chronic diastolic CHF which could also be source of his effusion. Also has pulmonary HTN concern for RHF -stat VBG, some new confusion per significant other. Check UA/culture. Didn't know date, but otherwise oriented with normal conversation -check covid -wean oxygen as tolerated   Acute on chronic diastolic CHF (congestive heart failure) (HCC) +orthopnea, +jvd, +effusions and respiratory failure with hypoxia with elevated BNP in setting of reduced diuretics due to hypotension/dehydration.  -given '40mg'$  of lasix in ED. hesitant to give more with hypotension. On '20mg'$  at home. Has not taken any pm dose.  -watch I/O and adjust as needed  -follow renal function  -daily weights -repeat echo pending. Last echo in 07/2021 normal EF. Diastolic indeterminate. There is the interventricular septum is  flattened in systole and diastole, consistent with right ventricular pressure and volume overload.  -thoracentesis today with PCCM  -possibly cards consult once echo results    Hypokalemia repleted in ED Check magnesium Trend   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[place]) Baseline creatinine has creeped up to around 2.0 over the past 5 months, hypotension likely has not helped this.  Close to his baseline today Watch closely  with diuresis Trend   Elevated troponin Troponin flat with no chest pain, not consistent with ACS ekg with no acute changes Likely demand ischemia in setting of acute respiratory failure with hypoxia Telemetry   Anemia of chronic disease At his baseline, continue to monitor   Permanent atrial fibrillation (HCC); CHA2DS2-VASc score =5 (agex2, aortic plaque, CHF, HTN) Rate around 110. Metoprolol weaned off back in April due to hypotension Rate likely driven up due to acute hypoxic respiratory failure and continue dyspnea Thoracentesis today by PCCM Continue telemetry Hold xarelto. Thoracentesis was 2100cc of bloody fluid. PCCM checking CT chest as well   Pulmonary hypertension ([place]) Followed by cardiology. Discussed RHC, but has not been medically stable enough to proceed with this.  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    Hyperlipidemia with target LDL less than 100 Continue lipitor   Ectatic abdominal aorta (HCC) Recent CT of abdomen showed infrarenal aortic dilation of 3.4 cm.  Follow-up evaluation in 2 years.  Chronic venous stasis Much improved per patient and out patient notes TED hose while inpatient, hard for him to put on at home   OSA on CPAP Continue cpap at night     Advance Care Planning:   Code Status: DNR   Consults: PCCM: Dr. Tamala Julian, nutrition, PT  DVT Prophylaxis: SCDs  Family Communication: significant other at bedside: Aldean Ast   Severity of Illness: The appropriate patient status for this patient is INPATIENT. Inpatient status is judged to be reasonable and necessary in order to provide the required intensity of service to ensure  the patient's safety. The patient's presenting symptoms, physical exam findings, and initial radiographic and laboratory data in the context of their chronic comorbidities is felt to place them at high risk for further clinical deterioration. Furthermore, it is not anticipated that the patient will be medically  stable for discharge from the hospital within 2 midnights of admission.   * I certify that at the point of admission it is my clinical judgment that the patient will require inpatient hospital care spanning beyond 2 midnights from the point of admission due to high intensity of service, high risk for further deterioration and high frequency of surveillance required.*  Author: Orma Flaming, MD 03/08/2022 4:38 PM  For on call review [email or web address].

## 2022-03-08 NOTE — ED Provider Notes (Signed)
[facility] EMERGENCY DEPARTMENT Provider Note   CSN: [id number] Arrival date & time: 03/08/22  1141     History  Chief Complaint  Patient presents with   Shortness of Breath    Patrick Bishop is a 81 y.o. male.  Patient reports he has had some shortness of breath for the past 2 days.  He has a history of congestive heart failure.  Patient is not on oxygen at home.  He does use CPAP.  Patient denies having any fever or chills he reports his legs are more swollen than usual.  She reports he has not had a cough he has not been around anyone who was sick.  Patient denies any chest pain he has not had any abdominal swelling.He has a home care provider.  The history is provided by the patient. No language interpreter was used.  Shortness of Breath Severity:  Moderate Onset quality:  Gradual Duration:  2 days Timing:  Constant Progression:  Worsening Chronicity:  New Context: not URI and not weather changes   Relieved by:  Nothing Worsened by:  Nothing Ineffective treatments:  None tried Associated symptoms: no chest pain        Home Medications Prior to Admission medications   Medication Sig Start Date End Date Taking? Authorizing Provider  atorvastatin (LIPITOR) 10 MG tablet TAKE 1 TABLET DAILY 01/24/22  Yes [name], MD  HYDROcodone-acetaminophen (NORCO/VICODIN) 5-325 MG tablet Take 1 tablet by mouth every 6 (six) hours as needed for moderate pain. 09/21/21  Yes Danford, Suann Larry, MD  tamsulosin (FLOMAX) 0.4 MG CAPS capsule Take 0.4 mg by mouth daily.   Yes [provider]  torsemide (DEMADEX) 20 MG tablet Take 20 mg  by mouth daily , may take an additional 20 mg if needed for swelling or dyspnea 01/17/22  Yes [name], MD  XARELTO 20 MG TABS tablet Take 1 tablet (20 mg total) by mouth daily with supper. 08/19/21  Yes [name], MD  acetaminophen (TYLENOL) 325 MG tablet Take 2 tablets (650 mg total) by mouth every 6 (six)  hours as needed for mild pain (or Fever >/= 101). Patient not taking: Reported on 11/02/2021 09/21/21   [name], MD  docusate sodium (COLACE) 100 MG capsule Take 1 capsule (100 mg total) by mouth 2 (two) times daily. Patient not taking: Reported on 03/08/2022 08/13/21   [name], MD  feeding supplement (ENSURE ENLIVE / ENSURE PLUS) LIQD Take 237 mLs by mouth 3 (three) times daily between meals. Patient not taking: Reported on 03/08/2022 09/21/21   [name], MD  midodrine (PROAMATINE) 2.5 MG tablet Take 1 tablet (2.5 mg total) by mouth 2 (two) times daily with a meal. 01/17/22   [name], MD  pantoprazole (PROTONIX) 40 MG tablet Take 1 tablet (40 mg total) by mouth 2 (two) times daily. Patient not taking: Reported on 03/08/2022 08/13/21 08/13/22  [name], MD  prochlorperazine (COMPAZINE) 10 MG tablet TAKE 1 TABLET(10 MG) BY MOUTH EVERY 6 HOURS AS NEEDED FOR NAUSEA OR VOMITING Patient not taking: Reported on 03/08/2022 12/14/20   [name], MD      Allergies    Aldactone [spironolactone]    Review of Systems   Review of Systems  Respiratory:  Positive for shortness of breath.   Cardiovascular:  Negative for chest pain.  All other systems reviewed and are negative.   Physical Exam Updated Vital Signs BP 110/75   Pulse (!) 103  Temp 97.8 F (36.6 C) (Oral)   Resp (!) 26   Ht '6\' 1"'$  (1.854 m)   Wt 99.8 kg   SpO2 97%   BMI 29.03 kg/m  Physical Exam Vitals reviewed.  Constitutional:      Appearance: He is well-developed.  HENT:     Head: Normocephalic.  Cardiovascular:     Rate and Rhythm: Normal rate and regular rhythm.  Pulmonary:     Effort: Tachypnea present.     Breath sounds: Decreased breath sounds present.  Chest:     Chest wall: No mass or tenderness.  Abdominal:     Palpations: Abdomen is soft.  Musculoskeletal:        General: Normal range of motion.     Cervical back: Normal range of motion.  Skin:    General:  Skin is warm.  Neurological:     General: No focal deficit present.     Mental Status: He is alert.  Psychiatric:        Mood and Affect: Mood normal.     ED Results / Procedures / Treatments   Labs (all labs ordered are listed, but only abnormal results are displayed) Labs Reviewed  CBC WITH DIFFERENTIAL/PLATELET - Abnormal; Notable for the following components:      Result Value   RBC 3.58 (*)    Hemoglobin 11.0 (*)    HCT 34.8 (*)    Lymphs Abs 0.4 (*)    All other components within normal limits  COMPREHENSIVE METABOLIC PANEL - Abnormal; Notable for the following components:   Potassium 3.3 (*)    Glucose, Bld 146 (*)    Creatinine, Ser 1.79 (*)    Calcium 8.7 (*)    Total Protein 6.1 (*)    Albumin 2.7 (*)    AST 13 (*)    Total Bilirubin 1.4 (*)    GFR, Estimated 38 (*)    All other components within normal limits  BRAIN NATRIURETIC PEPTIDE - Abnormal; Notable for the following components:   B Natriuretic Peptide 100.7 (*)    All other components within normal limits  TROPONIN I (HIGH SENSITIVITY) - Abnormal; Notable for the following components:   Troponin I (High Sensitivity) 21 (*)    All other components within normal limits  TROPONIN I (HIGH SENSITIVITY)    EKG EKG Interpretation  Date/Time:  Tuesday March 08 2022 11:42:48 EDT Ventricular Rate:  108 PR Interval:    QRS Duration: 80 QT Interval:  360 QTC Calculation: 483 R Axis:   39 Text Interpretation: Atrial fibrillation Low voltage, precordial leads Repol abnrm suggests ischemia, anterolateral Confirmed by Fredia Sorrow [phone]) on 03/08/2022 11:59:01 AM  Radiology DG Chest Portable 1 View  Result Date: 03/08/2022 CLINICAL DATA:  SOB EXAM: PORTABLE CHEST 1 VIEW COMPARISON:  January 03, 2022 FINDINGS: There is moderately large left pleural effusion seen and is new. Likely left basilar atelectasis. Elevation of the right hemidiaphragm. Small right pleural effusion. Minor right basilar atelectasis. Bony  thorax is unremarkable. IMPRESSION: Moderately large left pleural effusion and is new. Small right pleural effusion. Bibasilar atelectasis. Electronically Signed   By: Frazier Richards M.D.   On: 03/08/2022 12:20    Procedures Procedures    Medications Ordered in ED Medications  furosemide (LASIX) injection 40 mg (has no administration in time range)    ED Course/ Medical Decision Making/ A&P  Medical Decision Making Patient complains of shortness of breath and probable congestive heart failure  Amount and/or Complexity of Data Reviewed External Data Reviewed: notes.    Details: Cardiology notes reviewed Labs: ordered. Decision-making details documented in ED Course.    Details: Labs ordered reviewed and interpreted. Potasium is low at 3.3 creatinine is 1.79 Radiology: ordered and independent interpretation performed. Decision-making details documented in ED Course.    Details: Chest x-ray shows moderately large left pleural effusion and small right pleural effusion bibasilar atelectasis ECG/medicine tests: ordered and independent interpretation performed. Decision-making details documented in ED Course.    Details: EKG shows atrial fib at a rate of 108/has variable atrial fibs on the monitor from 80-1 10 Discussion of management or test interpretation with external provider(s): Hospitalist consulted for admission  Risk Prescription drug management. Decision regarding hospitalization.           Final Clinical Impression(s) / ED Diagnoses Final diagnoses:  Shortness of breath  Hypoxia  Acute congestive heart failure, unspecified heart failure type [facility])    Rx / DC Orders ED Discharge Orders     None         Sidney Ace 03/08/22 1522    Fredia Sorrow, MD 03/26/22 [phone]

## 2022-03-08 NOTE — Assessment & Plan Note (Addendum)
Rate around 110. Metoprolol weaned off back in April due to hypotension Rate likely driven up due to acute hypoxic respiratory failure and continue dyspnea Thoracentesis today by PCCM Continue telemetry Hold xarelto. Thoracentesis was 2100cc of bloody fluid. PCCM checking CT chest as well

## 2022-03-08 NOTE — Assessment & Plan Note (Signed)
Continue cpap at night  

## 2022-03-08 NOTE — Assessment & Plan Note (Addendum)
repleted in ED Continue home potassium replacement at 53mq BID  Check magnesium Trend

## 2022-03-08 NOTE — Assessment & Plan Note (Addendum)
81 year old presenting to ED with shortness of breath and acute hypoxic respiratory failure to 80% on RA with EMS and NRB placed. Found to have large left pleural effusion and small right pleural effusion  -admit to progressive -large effusion likely driving his respiratory failure -consulted PCCM to do thoracentesis today as he is still working to breath on 4L, although maintaining saturation>per PCCM 2100cc of bloody fluid. Checking CT, hold xarelto per PCCM  -check fluid studies. Hx of urothelial cancer  -appears to be in acute on chronic diastolic CHF which could also be source of his effusion. Also has pulmonary HTN concern for RHF -stat VBG, some new confusion per significant other. Check UA/culture. Didn't know date, but otherwise oriented with normal conversation -check covid -wean oxygen as tolerated

## 2022-03-09 ENCOUNTER — Inpatient Hospital Stay (HOSPITAL_COMMUNITY): Payer: Medicare Other

## 2022-03-09 ENCOUNTER — Encounter (HOSPITAL_COMMUNITY): Payer: Self-pay | Admitting: Family Medicine

## 2022-03-09 DIAGNOSIS — I5031 Acute diastolic (congestive) heart failure: Secondary | ICD-10-CM | POA: Diagnosis not present

## 2022-03-09 DIAGNOSIS — R918 Other nonspecific abnormal finding of lung field: Secondary | ICD-10-CM

## 2022-03-09 DIAGNOSIS — J9601 Acute respiratory failure with hypoxia: Secondary | ICD-10-CM | POA: Diagnosis not present

## 2022-03-09 DIAGNOSIS — J90 Pleural effusion, not elsewhere classified: Secondary | ICD-10-CM

## 2022-03-09 LAB — BLOOD GAS, VENOUS
Acid-Base Excess: 12.1 mmol/L — ABNORMAL HIGH (ref 0.0–2.0)
Bicarbonate: 38.7 mmol/L — ABNORMAL HIGH (ref 20.0–28.0)
Drawn by: 5915
O2 Saturation: 42 %
Patient temperature: 36.7
pCO2, Ven: 56 mmHg (ref 44–60)
pH, Ven: 7.44 — ABNORMAL HIGH (ref 7.25–7.43)
pO2, Ven: <31 mmHg — CL (ref 32–45)

## 2022-03-09 LAB — CBC
HCT: 36.1 % — ABNORMAL LOW (ref 39.0–52.0)
Hemoglobin: 11.6 g/dL — ABNORMAL LOW (ref 13.0–17.0)
MCH: 31.1 pg (ref 26.0–34.0)
MCHC: 32.1 g/dL (ref 30.0–36.0)
MCV: 96.8 fL (ref 80.0–100.0)
Platelets: 257 10*3/uL (ref 150–400)
RBC: 3.73 MIL/uL — ABNORMAL LOW (ref 4.22–5.81)
RDW: 14.8 % (ref 11.5–15.5)
WBC: 7.2 10*3/uL (ref 4.0–10.5)
nRBC: 0 % (ref 0.0–0.2)

## 2022-03-09 LAB — URINALYSIS, ROUTINE W REFLEX MICROSCOPIC
Bilirubin Urine: NEGATIVE
Glucose, UA: NEGATIVE mg/dL
Ketones, ur: NEGATIVE mg/dL
Nitrite: NEGATIVE
Protein, ur: 100 mg/dL — AB
RBC / HPF: >50 RBC/hpf — ABNORMAL HIGH (ref 0–5)
Specific Gravity, Urine: 1.013 (ref 1.005–1.030)
WBC, UA: >50 WBC/hpf — ABNORMAL HIGH (ref 0–5)
pH: 5 (ref 5.0–8.0)

## 2022-03-09 LAB — BASIC METABOLIC PANEL
Anion gap: 9 (ref 5–15)
BUN: 24 mg/dL — ABNORMAL HIGH (ref 8–23)
CO2: 32 mmol/L (ref 22–32)
Calcium: 8.3 mg/dL — ABNORMAL LOW (ref 8.9–10.3)
Chloride: 100 mmol/L (ref 98–111)
Creatinine, Ser: 1.73 mg/dL — ABNORMAL HIGH (ref 0.61–1.24)
GFR, Estimated: 39 mL/min — ABNORMAL LOW (ref >60)
Glucose, Bld: 134 mg/dL — ABNORMAL HIGH (ref 70–99)
Potassium: 3.1 mmol/L — ABNORMAL LOW (ref 3.5–5.1)
Sodium: 141 mmol/L (ref 135–145)

## 2022-03-09 LAB — ECHOCARDIOGRAM COMPLETE
Area-P 1/2: 4.49 cm2
Height: 73 in
Weight: 3552 oz

## 2022-03-09 MED ORDER — POTASSIUM CHLORIDE CRYS ER 20 MEQ PO TBCR
40.0000 meq | EXTENDED_RELEASE_TABLET | Freq: Two times a day (BID) | ORAL | Status: AC
  Administered 2022-03-09: 40 meq via ORAL
  Filled 2022-03-09: qty 2

## 2022-03-09 MED ORDER — PERFLUTREN LIPID MICROSPHERE
1.0000 mL | INTRAVENOUS | Status: AC | PRN
  Administered 2022-03-09: 2 mL via INTRAVENOUS

## 2022-03-09 MED ORDER — DOCUSATE SODIUM 100 MG PO CAPS: 100.0000 mg | ORAL_CAPSULE | Freq: Two times a day (BID) | ORAL | Status: DC | PRN

## 2022-03-09 MED ORDER — CHLORHEXIDINE GLUCONATE CLOTH 2 % EX PADS: 6.0000 | MEDICATED_PAD | Freq: Every day | CUTANEOUS | Status: DC

## 2022-03-09 MED ORDER — MIDODRINE HCL 5 MG PO TABS
5.0000 mg | ORAL_TABLET | Freq: Three times a day (TID) | ORAL | Status: DC
  Administered 2022-03-09 – 2022-03-11 (×6): 5 mg via ORAL
  Filled 2022-03-09 (×7): qty 1

## 2022-03-09 NOTE — Plan of Care (Signed)

## 2022-03-09 NOTE — Progress Notes (Signed)
NAME:  Patrick Bishop, MRN:  [id number], DOB:  Jan 28, 1941, LOS: 1 ADMISSION DATE:  03/08/2022, CONSULTATION DATE:  03/08/22 REFERRING MD:  Rogers Blocker, CHIEF COMPLAINT:  SOB   History of Present Illness:  81 year old man with 60 pack year smoking hx, prior bladder cancer, OSA, dHF presenting with insidious onset DOE, orthopnea.  +associated dry cough.  No preceding illnesses.  No concurrent fevers.  No hx of chest wall trauma.  + weight loss 100 lbs over past year he states intentional. No chest pain.  CXR with mostly L sided effusion new from April 2023.  PCCM consulted for further management.    Pertinent  Medical History  AAA Arthritis Bladder cancer OSA on CPAP HTN Mild post COVID fibrosis  Significant Hospital Events: Including procedures, antibiotic start and stop dates in addition to other pertinent events   6/13 admitted , left thoracentesis 2.1 L of bloody fluid removed Pleural fluid results high LDH, low protein, predominant neutrophils, Gram stain negative CT chest without contrast 6/13 moderate right effusion, small left effusion, extensive thoracic lymphadenopathy, right paratracheal 2.1 cm lymph node, trace left pneumothorax thiamine B1 Foley cath likely , multiple pulmonary nodules, right upper lobe 1.2 and 1.3 cm, left lower lobe 1.7 cm  Interim History / Subjective:   Breathing better. Denies chest pain.   Objective   Blood pressure 105/71, pulse 98, temperature 98.1 F (36.7 C), resp. rate 20, height '6\' 1"'$  (1.854 m), weight 100.7 kg, SpO2 92 %.        Intake/Output Summary (Last 24 hours) at 03/09/2022 1337 Last data filed at 03/09/2022 0826 Gross per 24 hour  Intake 70 ml  Output 250 ml  Net -180 ml   Filed Weights   03/08/22 1159 03/09/22 0356  Weight: 99.8 kg 100.7 kg    Examination: General: Frail elderly man in NAD HENT: MMM, trachea midline Lungs: Decreased breath sounds bilateral, no accessory muscle use, no rhonchi.  Try to Cardiovascular: S1-S2  irregular Abdomen: soft, +BS Extremities: Chronic venous stasis changes with minimal overlying edema Neuro: moves all 4 ext to command but profoundly weak; very hard of hearing Skin: scattered bruising MSK: kyphosis and some sort of chest wall deformity along L shoulder  Resolved Hospital Problem list   N/A  Assessment & Plan:   Acute hypoxic respiratory failure Bilateral pleural effusion- bloody on Lt tap.  Differential is trauma (denies), infectious (denies), malignant.  Weight loss of 100 lbs is a bit ominous but states this was intentional. -Pleural fluid appears to be borderline exudative -Await pleural fluid cytology -If negative, will proceed with right thoracentesis  Pulmonary nodules centimeters and lymphadenopathy -concerning for primary lung malignancy , would be very uncommon for bladder cancer to go to the lungs -If repeat cytology negative, can consider EBUS/bronchoscopy   HFpEF CKD stage IIIb Demand ischemia -Per primary team  Discussed above plan with patient and hospitalist    Best Practice (right click and "Reselect all SmartList Selections" daily)  Per primary  Labs   CBC: Recent Labs  Lab 03/08/22 1226 03/08/22 1745 03/09/22 0426  WBC 6.5  --  7.2  NEUTROABS 5.5  --   --   HGB 11.0* 10.9* 11.6*  HCT 34.8* 32.0* 36.1*  MCV 97.2  --  96.8  PLT 289  --  257     Basic Metabolic Panel: Recent Labs  Lab 03/08/22 1226 03/08/22 1447 03/08/22 1745 03/09/22 0426  NA 143  --  144 141  K 3.3*  --  3.2* 3.1*  CL 101  --   --  100  CO2 32  --   --  32  GLUCOSE 146*  --   --  134*  BUN 21  --   --  24*  CREATININE 1.79*  --   --  1.73*  CALCIUM 8.7*  --   --  8.3*  MG  --  2.1  --   --     GFR: Estimated Creatinine Clearance: 41.8 mL/min (A) (by C-G formula based on SCr of 1.73 mg/dL (H)). Recent Labs  Lab 03/08/22 1226 03/09/22 0426  WBC 6.5 7.2     Liver Function Tests: Recent Labs  Lab 03/08/22 1226  AST 13*  ALT 9  ALKPHOS  76  BILITOT 1.4*  PROT 6.1*  ALBUMIN 2.7*    No results for input(s): "LIPASE", "AMYLASE" in the last 168 hours. No results for input(s): "AMMONIA" in the last 168 hours.  ABG    Component Value Date/Time   HCO3 38.7 (H) 03/09/2022 0426   TCO2 37 (H) 03/08/2022 1745   O2SAT 42 03/09/2022 0426     Coagulation Profile: Recent Labs  Lab 03/08/22 1732  INR 1.5*    Cardiac Enzymes: No results for input(s): "CKTOTAL", "CKMB", "CKMBINDEX", "TROPONINI" in the last 168 hours.  HbA1C: Hgb A1c MFr Bld  Date/Time Value Ref Range Status  09/17/2021 03:12 AM 5.4 4.8 - 5.6 % Final    Comment:    (NOTE) Pre diabetes:          5.7%-6.4%  Diabetes:              >6.4%  Glycemic control for   <7.0% adults with diabetes   10/15/2020 02:55 PM 5.4 4.8 - 5.6 % Final    Comment:    (NOTE) Pre diabetes:          5.7%-6.4%  Diabetes:              >6.4%  Glycemic control for   <7.0% adults with diabetes     CBG: No results for input(s): "GLUCAP" in the last 168 hours.   Kara Mead MD. Shade Flood. [facility] Pager : [phone]  If no response to pager , please call [phone] until 7 pm After 7:00 pm call Elink  [phone]   03/09/2022

## 2022-03-09 NOTE — Evaluation (Signed)
Physical Therapy Evaluation Patient Details Name: Patrick Bishop MRN: [id number] DOB: 1940-11-09 Today's Date: 03/09/2022  History of Present Illness  Pt is an 81 y.o. male admitted from [facility] on 03/09/22 with worsening SOB, decreased oral intake, weight loss. Workup for acute respiratory failure with bilateral pleural effusions, acute on chronic CHF. Plan for thoracentesis 6/14. 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   Clinical Impression  Pt presents with an overall decrease in functional mobility secondary to above. PTA, pt resident at [facility] where staff provides cleaning and meals; pt primarily stays in bed, reports working on standing/walking some with therapy; girlfriend assists with bed-level ADLs. Today, pt requiring minA for bed mobility; pt declines standing/OOB attempts despite encouragement and education. Pt notes improvement in DOE, endorses some lightheadedness sitting EOB which improved with time. Pt would benefit from continued acute PT services to maximize functional mobility and independence prior to d/c with continued PT services at [facility].     Orthostatic BPs Supine 105/64  Sitting 64/54  Return to supine (3rd attempt reading BP as pt moving UE) 124/82      Recommendations for follow up therapy are one component of a multi-disciplinary discharge planning process, led by the attending physician.  Recommendations may be updated based on patient status, additional functional criteria and insurance authorization.  Follow Up Recommendations Home health PT (at [facility])    Assistance Recommended at Discharge Intermittent Supervision/Assistance  Patient can return home with the following  A lot of help with walking and/or transfers;A lot of help with bathing/dressing/bathroom;Assistance with cooking/housework;Assist for transportation;Help with stairs or  ramp for entrance    Equipment Recommendations BSC/3in1 (pt request)  Recommendations for Other Services       Functional Status Assessment Patient has had a recent decline in their functional status and demonstrates the ability to make significant improvements in function in a reasonable and predictable amount of time.     Precautions / Restrictions Precautions Precautions: Fall;Other (comment) Precaution Comments: urine incontinence; watch SpO2 (does not wear at home); watch BP (+ orthostatic on 6/14) Restrictions Weight Bearing Restrictions: No      Mobility  Bed Mobility Overal bed mobility: Needs Assistance Bed Mobility: Supine to Sit, Sit to Supine     Supine to sit: Min assist, HOB elevated     General bed mobility comments: good initiation of BLE movement to EOB, use of bed rail, minA for HHA to elevate trunk; return to supine in flat bed without assist, pt able to scoot self up with BUE rail support and cues to use BLEs, bed in trendelenberg position for gravity assist    Transfers                   General transfer comment: pt declined standing or transfer to recliner despite education/encouragement; encouraged lateral scoot towards HOB, pt minimally tried with some difficulty then stating, "I need to lay back down"    Ambulation/Gait                  Stairs            Wheelchair Mobility    Modified Rankin (Stroke Patients Only)       Balance Overall balance assessment: Needs assistance Sitting-balance support: No upper extremity supported, Feet supported Sitting balance-Leahy Scale: Fair  Pertinent Vitals/Pain Pain Assessment Pain Assessment: Faces Faces Pain Scale: Hurts little more Pain Location: lower abdomen ("gastric pain since I just ate") Pain Descriptors / Indicators: Discomfort, Grimacing, Guarding Pain Intervention(s): Monitored during session, Limited activity  within patient's tolerance, Repositioned    Home Living Family/patient expects to be discharged to:: Other (Comment) ([place]) Living Arrangements: Spouse/significant other (girlfriend) Available Help at Discharge: Friend(s);Available 24 hours/day Type of Home: Independent living facility Home Access: Level entry       Home Layout: One level Home Equipment: Conservation officer, nature (2 wheels);Wheelchair - Press photographer      Prior Function Prior Level of Function : Needs assist             Mobility Comments: typically stays in bed majority of day; reports working with therapy on standing and walking, but requires assist; "If I try to get up by myself, I fall and have to call the fire dept to come get me up" ADLs Comments: ILF provides cleaning and meals. girlfriend assists with bed-level bathing, pt wears Depends and girlfriend assists with clean-up     Hand Dominance        Extremity/Trunk Assessment   Upper Extremity Assessment Upper Extremity Assessment: Generalized weakness    Lower Extremity Assessment Lower Extremity Assessment: Generalized weakness (noted bilateral lower leg swelling, flaking/dry skin)    Cervical / Trunk Assessment Cervical / Trunk Assessment: Kyphotic  Communication   Communication: HOH  Cognition Arousal/Alertness: Awake/alert Behavior During Therapy: WFL for tasks assessed/performed, Flat affect Overall Cognitive Status: Within Functional Limits for tasks assessed                                 General Comments: WFL for simple tasks, not formally assessed        General Comments General comments (skin integrity, edema, etc.): supine BP 105/64, sitting BP 64/54 (pt c/o mild dizziness which improved with time sitting), return to supine BP 124/82 (reading on third attempt as pt moving RUE); HR 120s. pt wanting to keep HOB near flat, educ on importance of upright, including to improve hypotension    Exercises      Assessment/Plan    PT Assessment Patient needs continued PT services  PT Problem List Decreased strength;Decreased activity tolerance;Decreased balance;Decreased mobility;Cardiopulmonary status limiting activity       PT Treatment Interventions DME instruction;Gait training;Functional mobility training;Therapeutic activities;Therapeutic exercise;Balance training;Patient/family education;Wheelchair mobility training    PT Goals (Current goals can be found in the Care Plan section)  Acute Rehab PT Goals Patient Stated Goal: return to ILF with girlfriend's assist PT Goal Formulation: With patient Time For Goal Achievement: 03/23/22 Potential to Achieve Goals: Good    Frequency Min 3X/week     Co-evaluation               AM-PAC PT "6 Clicks" Mobility  Outcome Measure Help needed turning from your back to your side while in a flat bed without using bedrails?: A Little Help needed moving from lying on your back to sitting on the side of a flat bed without using bedrails?: A Lot Help needed moving to and from a bed to a chair (including a wheelchair)?: A Lot Help needed standing up from a chair using your arms (e.g., wheelchair or bedside chair)?: A Lot Help needed to walk in hospital room?: Total Help needed climbing 3-5 steps with a railing? : Total 6 Click Score: 11  End of Session Equipment Utilized During Treatment: Oxygen Activity Tolerance: Patient limited by fatigue Patient left: in bed;with call bell/phone within reach;with bed alarm set;with SCD's reapplied Nurse Communication: Mobility status PT Visit Diagnosis: Other abnormalities of gait and mobility (R26.89);Muscle weakness (generalized) (M62.81)    Time: 1430-1450 PT Time Calculation (min) (ACUTE ONLY): 20 min   Charges:   PT Evaluation $PT Eval Moderate Complexity: [place], PT, DPT Acute Rehabilitation Services  Pager [phone] Office [place] 03/09/2022, 3:34 PM

## 2022-03-09 NOTE — Progress Notes (Addendum)
Initial Nutrition Assessment  DOCUMENTATION CODES:   Severe malnutrition in context of chronic illness  INTERVENTION:   Ensure Enlive po TID, each supplement provides 350 kcal and 20 grams of protein.  Hormel shake TID with meals  Magic cup with meals  Pudding with meals  Educated pt and significant other on the importance of adequate nutrition. Discussed nutrition plan with poor appetite will try to incorporate more oral nutrition supplements   NUTRITION DIAGNOSIS:   Severe Malnutrition related to chronic illness as evidenced by severe fat depletion, severe muscle depletion, energy intake < or equal to 75% for > or equal to 1 month.  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   Spoke with significant other who is at bedside. Pt and significant other provide hx. They report that pt has been losing weight since last Oct 2022. This has been about 100 lb which they believe is fluid. However, nutrition hx and exam indicate that pt has lost fat and muscle as well. Fluid shifts mask true weight loss.  Per significant other he has not really ate anything for the last 6 weeks due to lack of appetite. Pt with no issues chewing despite having no teeth, only with really tough meats. Pt confirms no appetite. He does try to drink one ensure daily but otherwise only drinks soda. He has not been very mobile since Oct 2022. He stays in the bed. Maybe once a week therapy gets him up to walk. He no longer goes to the cafeteria at his retirement community.   CCM following for  bilateral pleural effusion. Noted pulmonary nodules concerning for primary lung cancer.   6/13 s/p thoracentesis 2100 ml blood tinged   Medications reviewed and include: colace, ensure TID, protonix, 40 mEq KCl BID  Labs reviewed: K 3.1   NUTRITION - FOCUSED PHYSICAL EXAM:  Flowsheet Row Most Recent Value  Orbital Region Severe depletion  Upper Arm Region Moderate depletion  Thoracic and Lumbar Region Severe depletion  Buccal Region Severe depletion  Temple Region Severe depletion  Clavicle Bone Region Severe depletion  Clavicle and Acromion Bone Region Severe depletion  Scapular Bone Region Severe depletion  Dorsal Hand Severe depletion  Patellar Region Unable to assess  Anterior Thigh Region Unable to assess  Posterior Calf Region Unable to assess  Edema (RD Assessment) Moderate  Hair Reviewed  Eyes Reviewed  Mouth Reviewed  [no teeth]  Skin Reviewed  Nails Reviewed       Diet Order:   Diet Order             Diet regular Room service appropriate? Yes; Fluid consistency: Thin  Diet effective now                   EDUCATION NEEDS:   Education needs have been addressed  Skin:  Skin Assessment: Reviewed RN Assessment (scabbed L elbow after fall; BLE discollored)  Last BM:  6/14  Height:   Ht Readings from Last 1 Encounters:  03/08/22 '6\' 1"'$  (1.854 m)  Weight:   Wt Readings from Last 1 Encounters:  03/09/22 100.7 kg    BMI:  Body mass index is 29.29 kg/m.  Estimated Nutritional Needs:   Kcal:  2100-2300  Protein:  115-130 grams  Fluid:  >2 L/day  Lockie Pares., RD, LDN, CNSC See AMiON for contact information

## 2022-03-09 NOTE — Progress Notes (Signed)
PROGRESS NOTE    Patrick Bishop  HFW:[id number] DOB: [birth year] DOA: 03/08/2022 PCP: Lujean Amel, MD     Brief Narrative:  Patrick Bishop is a 81 y.o. 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  His girlfriend states he has had some shortness of breath over the last few few days, but got acutely worse this AM. He was gasping for air, but was able to talk in full sentences.  She states he has hardly eaten for weeks and has been in bed. She states he has lost over 100 pounds over the past 6 months. They deny any swelling in legs and state his legs have actually had improvement in the swelling. No coughing. He does endorse orthopnea. He does not eat much salt. No recent illness. He has not felt like he needed any extra demadex.  Back in April his cardiologist reduced his torsemide to once daily and only used second dose PRN, was '20mg'$  BID scheduled. Also weaned off of his metoprolol due to hypotension.   Chest x-ray revealed bilateral pleural effusion, moderately large left pleural effusion.  PCCM was consulted.  Patient underwent left-sided thoracentesis on 6/13.  CT chest revealed multiple pulmonary nodules and right-sided pleural effusion, small left pneumothorax.  New events last 24 hours / Subjective: No new complaints or physical symptoms today.  Discussed with patient on waiting for cytology from thoracentesis on left.  He may require right-sided thoracentesis as well as possible bronchoscopy.  Assessment & Plan:  Principal Problem:   Acute respiratory failure with hypoxia with large left pleural effusion  Active Problems:   Chronic diastolic CHF (congestive heart failure) (HCC)   Hypokalemia   Elevated troponin   Stage 3b chronic kidney disease (CKD)  (HCC)   Permanent atrial fibrillation (HCC); CHA2DS2-VASc score =5 (agex2, aortic plaque, CHF, HTN)   Anemia of chronic disease   Pulmonary hypertension (HCC)   Hyperlipidemia with target LDL less than 100   Ectatic abdominal aorta (HCC)   OSA on CPAP   Pleural effusion, left   Chronic venous stasis   Pulmonary nodules   Acute hypoxemic respiratory failure with bilateral pleural effusion -COVID-negative -Status post left thoracentesis 6/13, resulted 2100 cc of bloody fluid.  Exudative fluid per lights criteria.  Cytology is pending.  Xarelto is on hold -Pulse ox dropped to 85% on room air, required 4 L nasal cannula O2.  Now on 2 L  Multiple pulmonary nodules and new thoracic adenopathy suspicious for neoplasm -Left pleural fluid cytology is pending at this time -Discussed findings with patient as well as with Dr. Elsworth Soho  Chronic diastolic heart failure -Patient was given Lasix in the emergency department -BNP 100.7 -Echocardiogram pending -Holding home Demadex due to hypotension   CKD stage IIIb -Baseline creatinine 2 -Stable  Demand ischemia -Likely in setting of respiratory failure -Without any chest pain, troponin trend is flat 21-->21, not consistent with ACS  Permanent A-fib -CHA2DS2-VASc score 5 -Xarelto on hold due to possible right-sided thoracentesis -Continue to monitor on telemetry  Chronic hypotension -Midodrine   Hyperlipidemia -Lipitor  OSA -CPAP nightly  GERD -PPI  Hypokalemia -Replace, trend  Ectatic abdominal aorta -Follow-up outpatient    DVT prophylaxis:  Place and maintain sequential compression device Start: 03/08/22 1637 Place TED hose Start: 03/08/22 1554  Code  Status: DNR Family Communication: None at bedside Disposition Plan:  Status is: Inpatient Remains inpatient appropriate because: await cytology    Antimicrobials:  Anti-infectives (From admission, onward)    None        Objective: Vitals:   03/09/22 0949  03/09/22 1136 03/09/22 1149 03/09/22 1150  BP: (!) 85/74  105/71 105/71  Pulse: (!) 106  98   Resp: '19  20 20  '$ Temp: 98.1 F (36.7 C) 98 F (36.7 C) 98.1 F (36.7 C)   TempSrc: Oral     SpO2: 92%  92%   Weight:      Height:        Intake/Output Summary (Last 24 hours) at 03/09/2022 1335 Last data filed at 03/09/2022 0826 Gross per 24 hour  Intake 70 ml  Output 250 ml  Net -180 ml   Filed Weights   03/08/22 1159 03/09/22 0356  Weight: 99.8 kg 100.7 kg    Examination:  General exam: Appears calm and comfortable  Respiratory system: Crackles left base, diminished breath sounds on right Cardiovascular system: S1 & S2 heard, irregular rhythm, tachycardic rate 100s Gastrointestinal system: Abdomen is nondistended, soft and nontender. Normal bowel sounds heard. Central nervous system: Alert and oriented. No focal neurological deficits. Speech clear.  Extremities: Symmetric in appearance  Skin: Bilateral lower extremities with bruising, chronic venous stasis Psychiatry: Judgement and insight appear normal. Mood & affect appropriate.   Data Reviewed: I have personally reviewed following labs and imaging studies  CBC: Recent Labs  Lab 03/08/22 1226 03/08/22 1745 03/09/22 0426  WBC 6.5  --  7.2  NEUTROABS 5.5  --   --   HGB 11.0* 10.9* 11.6*  HCT 34.8* 32.0* 36.1*  MCV 97.2  --  96.8  PLT 289  --  993   Basic Metabolic Panel: Recent Labs  Lab 03/08/22 1226 03/08/22 1447 03/08/22 1745 03/09/22 0426  NA 143  --  144 141  K 3.3*  --  3.2* 3.1*  CL 101  --   --  100  CO2 32  --   --  32  GLUCOSE 146*  --   --  134*  BUN 21  --   --  24*  CREATININE 1.79*  --   --  1.73*  CALCIUM 8.7*  --   --  8.3*  MG  --  2.1  --   --    GFR: Estimated Creatinine Clearance: 41.8 mL/min (A) (by C-G formula based on SCr of 1.73 mg/dL (H)). Liver Function Tests: Recent Labs  Lab 03/08/22 1226  AST 13*  ALT 9  ALKPHOS 76  BILITOT 1.4*  PROT 6.1*  ALBUMIN 2.7*   No results  for input(s): "LIPASE", "AMYLASE" in the last 168 hours. No results for input(s): "AMMONIA" in the last 168 hours. Coagulation Profile: Recent Labs  Lab 03/08/22 1732  INR 1.5*   Cardiac Enzymes: No results for input(s): "CKTOTAL", "CKMB", "CKMBINDEX", "TROPONINI" in the last 168 hours. BNP (last 3 results) No results for input(s): "PROBNP" in the last 8760 hours. HbA1C: No results for input(s): "HGBA1C" in the last 72 hours. CBG: No results for input(s): "GLUCAP" in the last 168 hours. Lipid Profile: No results for input(s): "CHOL", "HDL", "LDLCALC", "TRIG", "CHOLHDL", "LDLDIRECT" in the last 72 hours. Thyroid Function Tests: Recent Labs    03/08/22 1732  TSH 0.913   Anemia Panel: No results for input(s): "VITAMINB12", "FOLATE", "FERRITIN", "TIBC", "IRON", "RETICCTPCT" in the last 72 hours. Sepsis Labs: No results for  input(s): "PROCALCITON", "LATICACIDVEN" in the last 168 hours.  Recent Results (from the past 240 hour(s))  SARS Coronavirus 2 by RT PCR (hospital order, performed in [facility] hospital lab) *cepheid single result test* Anterior Nasal Swab     Status: None   Collection Time: 03/08/22  3:19 PM   Specimen: Anterior Nasal Swab  Result Value Ref Range Status   SARS Coronavirus 2 by RT PCR NEGATIVE NEGATIVE Final    Comment: (NOTE) SARS-CoV-2 target nucleic acids are NOT DETECTED.  The SARS-CoV-2 RNA is generally detectable in upper and lower respiratory specimens during the acute phase of infection. The lowest concentration of SARS-CoV-2 viral copies this assay can detect is 250 copies / mL. A negative result does not preclude SARS-CoV-2 infection and should not be used as the sole basis for treatment or other patient management decisions.  A negative result may occur with improper specimen collection / handling, submission of specimen other than nasopharyngeal swab, presence of viral mutation(s) within the areas targeted by this assay, and inadequate number  of viral copies (<250 copies / mL). A negative result must be combined with clinical observations, patient history, and epidemiological information.  Fact Sheet for Patients:   https://www.patel.info/  Fact Sheet for Healthcare Providers: https://hall.com/  This test is not yet approved or  cleared by the [place] FDA and has been authorized for detection and/or diagnosis of SARS-CoV-2 by FDA under an Emergency Use Authorization (EUA).  This EUA will remain in effect (meaning this test can be used) for the duration of the COVID-19 declaration under Section 564(b)(1) of the Act, 21 U.S.C. section 360bbb-3(b)(1), unless the authorization is terminated or revoked sooner.  Performed at [facility], [place] [address], [place], [place] [address]   Body fluid culture w Gram Stain     Status: None (Preliminary result)   Collection Time: 03/08/22  4:30 PM   Specimen: Pleural Fluid  Result Value Ref Range Status   Specimen Description PLEURAL  Final   Special Requests NONE  Final   Gram Stain   Final    FEW WBC PRESENT, PREDOMINANTLY PMN NO ORGANISMS SEEN    Culture   Final    NO GROWTH < 24 HOURS Performed at [facility], [place] [address], [place], [place] [address]    Report Status PENDING  Incomplete      Radiology Studies: DG Chest Port 1 View  Result Date: 03/09/2022 CLINICAL DATA:  Encounter for hemothorax. EXAM: PORTABLE CHEST 1 VIEW COMPARISON:  Chest CT yesterday at 5:01 p.m., portable chest yesterday at 6:15 p.m. FINDINGS: 4:58 a.m., 03/09/2022. The cardiac size is normal. There is a stable mediastinal configuration. There is aortic atherosclerosis. Small left and moderate right pleural effusions are again noted with retrocardiac left lower lobe atelectasis or consolidation and hazy interstitial change overlying the right pleural effusion. There is a trace left apicolateral pneumothorax similar to the previous study. No  right pneumothorax. Remainder of the lungs are clear. Right upper lobe pulmonary nodules noted on CT are not visible radiographically. There is a left lower lobe nodule which is also not visible radiographically. No acute osseous abnormality is seen. There is advanced right glenohumeral DJD. IMPRESSION: Moderate-sized right and small left pleural effusions and overlying lung opacities. Stable overall aeration. Trace left apicolateral pneumothorax. Pulmonary nodules are not visible on x-ray. Electronically Signed   By: Telford Nab M.D.   On: 03/09/2022 06:11   DG Chest Port 1 View  Result Date:  03/08/2022 CLINICAL DATA:  Shortness of breath EXAM: PORTABLE CHEST 1 VIEW COMPARISON:  Previous studies including CT and chest radiograph done earlier today FINDINGS: Transverse diameter of heart is slightly increased. There is significant interval decrease in left pleural effusion. Small left apical pneumothorax seen in the CT is difficult to visualize. Small right pleural effusion is seen. Increased markings are seen in the medial aspect of both lower lung fields and right parahilar region. Degenerative changes are noted in the right shoulder and both AC joints. IMPRESSION: There is interval decrease in left pleural effusion. Increased markings in the right parahilar region and both lower lung fields may suggest atelectasis/pneumonia. Electronically Signed   By: Elmer Picker M.D.   On: 03/08/2022 18:35   CT CHEST WO CONTRAST  Result Date: 03/08/2022 CLINICAL DATA:  Pneumonia. Complication suspected * Tracking Code: BO * EXAM: CT CHEST WITHOUT CONTRAST TECHNIQUE: Multidetector CT imaging of the chest was performed following the standard protocol without IV contrast. RADIATION DOSE REDUCTION: This exam was performed according to the departmental dose-optimization program which includes automated exposure control, adjustment of the mA and/or kV according to patient size and/or use of iterative reconstruction  technique. COMPARISON:  Plain film of earlier today.  Chest CT 08/05/2021. FINDINGS: Cardiovascular: Aortic atherosclerosis. Tortuous thoracic aorta. Mild cardiomegaly. Left main and 3 vessel coronary artery calcification. Pulmonary artery enlargement, outflow tract 3.4 cm Mediastinum/Nodes: Extensive thoracic adenopathy. Right paratracheal 2.1 cm node on 50/3 is new. Hilar regions poorly evaluated without intravenous contrast. Lungs/Pleura: Moderate right and small left pleural effusions, new since the prior CT. Trace left-sided pneumothorax, including at the apex on 23/5 and within the anterior inferior left chest on 101/5. Right upper lobe pulmonary nodules x2 including at 1.2 and 1.3 cm on 67/5. At the site of a 4 mm nodule on 08/05/2021. Mild to moderate right hemidiaphragm elevation. Bibasilar compressive atelectasis. More cephalad 7 mm right upper lobe pulmonary nodule on 48/5, new. Anteromedial right upper lobe pleural-based 9 mm pulmonary nodule on 42/5, new. Suspect a left lower lobe pulmonary nodule of 1.7 cm on 107/5. This is surrounded by atelectasis. Upper Abdomen: Subtle dependent gallstone. Normal noncontrast appearance of the imaged liver, spleen, stomach, pancreas, adrenal glands. Bilateral renal atrophy Musculoskeletal: left chest wall lipoma is incompletely imaged including at 17.4 cm on 48/3. 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 IMPRESSION: 1. Constellation of findings, including new and enlarged pulmonary nodules, new thoracic adenopathy which are highly suspicious for neoplasm, either metastatic disease or 1 or more primary bronchogenic carcinomas with nodal metastasis. 2. New right larger than left pleural effusions with compressive atelectasis. 3. Trace left-sided pneumothorax. 4. Coronary artery atherosclerosis. Aortic Atherosclerosis (ICD10-I70.0). 5. Bilateral renal atrophy 6. Cholelithiasis 7. Pulmonary artery enlargement suggests pulmonary arterial hypertension. Electronically Signed   By:  Abigail Miyamoto M.D.   On: 03/08/2022 17:35   DG Chest Portable 1 View  Result Date: 03/08/2022 CLINICAL DATA:  SOB EXAM: PORTABLE CHEST 1 VIEW COMPARISON:  January 03, 2022 FINDINGS: There is moderately large left pleural effusion seen and is new. Likely left basilar atelectasis. Elevation of the right hemidiaphragm. Small right pleural effusion. Minor right basilar atelectasis. Bony thorax is unremarkable. IMPRESSION: Moderately large left pleural effusion and is new. Small right pleural effusion. Bibasilar atelectasis. Electronically Signed   By: Frazier Richards M.D.   On: 03/08/2022 12:20      Scheduled Meds:  atorvastatin  10 mg Oral Daily   feeding supplement  237 mL Oral TID BM   midodrine  5  mg Oral TID WC   pantoprazole  40 mg Oral BID   potassium chloride SA  40 mEq Oral BID   sodium chloride flush  3 mL Intravenous Q12H   tamsulosin  0.4 mg Oral Daily   Continuous Infusions:  sodium chloride       LOS: 1 day     Dessa Phi, DO [facility] 03/09/2022, 1:35 PM   Available via Epic secure chat 7am-7pm After these hours, please refer to coverage provider listed on [email or web address]

## 2022-03-09 NOTE — Discharge Instructions (Signed)
[facility] Stay Proper nutrition can help your body recover from illness and injury.   Foods and beverages high in protein, vitamins, and minerals help rebuild muscle loss, promote healing, & reduce fall risk.   In addition to eating healthy foods, a nutrition shake is an easy, delicious way to get the nutrition you need during and after your hospital stay  It is recommended that you continue to drink 4 bottles per day of:       ensure plus or boost plus or carnation instant breakfast with whole milk for at least 1 month (30 days) after your hospital stay   Tips for adding a nutrition shake into your routine: As allowed, drink one with vitamins or medications instead of water or juice Enjoy one as a tasty mid-morning or afternoon snack Drink cold or make a milkshake out of it Drink one instead of milk with cereal or snacks Use as a coffee creamer   Available at the following grocery stores and pharmacies:           * [facility] [phone]            For COUPONS visit: www.ensure.com/join or http://dawson-may.com/   Suggested Substitutions Ensure Plus = Boost Plus = Carnation Breakfast Essentials = Boost Compact Ensure Active Clear = Boost Breeze Glucerna Shake = Boost Glucose Control = Carnation Breakfast Essentials SUGAR FREE

## 2022-03-09 NOTE — Progress Notes (Signed)
Patient declined CPAP for the night. Sp02=98% on 2lpm, will continue to monitor patient.

## 2022-03-09 NOTE — Progress Notes (Signed)
   03/09/22 0949  Assess: MEWS Score  Temp 98.1 F (36.7 C)  BP (!) 85/74  MAP (mmHg) 80  Pulse Rate (!) 106  ECG Heart Rate (!) 106  Resp 19  Level of Consciousness Alert  SpO2 92 %  O2 Device Nasal Cannula  Patient Activity (if Appropriate) In bed  O2 Flow Rate (L/min) 2 L/min  Assess: MEWS Score  MEWS Temp 0  MEWS Systolic 1  MEWS Pulse 1  MEWS RR 0  MEWS LOC 0  MEWS Score 2  MEWS Score Color Yellow  Assess: if the MEWS score is Yellow or Red  Were vital signs taken at a resting state? Yes  Focused Assessment No change from prior assessment  Does the patient meet 2 or more of the SIRS criteria? No  MEWS guidelines implemented *See Row Information* Yes  Treat  Pain Scale 0-10  Pain Score 0  Take Vital Signs  Increase Vital Sign Frequency  Yellow: Q 2hr X 2 then Q 4hr X 2, if remains yellow, continue Q 4hrs  Escalate  MEWS: Escalate Yellow: discuss with charge nurse/RN and consider discussing with provider and RRT  Notify: Charge Nurse/RN  Name of Charge Nurse/RN Notified Janett Billow RN  Date Charge Nurse/RN Notified 03/09/22  Time Charge Nurse/RN Notified 67  Notify: Provider  Provider Name/Title Dr. Maylene Roes  Date Provider Notified 03/09/22  Time Provider Notified 1043  Method of Notification Page (secure chat)  Notification Reason Other (Comment) (yellow mews, BP, HR RR)  Provider response See new orders (increase midodrine at noon)  Date of Provider Response 03/09/22  Time of Provider Response 1044  Assess: SIRS CRITERIA  SIRS Temperature  0  SIRS Pulse 1  SIRS Respirations  0  SIRS WBC 0  SIRS Score Sum  1

## 2022-03-10 ENCOUNTER — Inpatient Hospital Stay (HOSPITAL_COMMUNITY): Payer: Medicare Other

## 2022-03-10 DIAGNOSIS — Z7901 Long term (current) use of anticoagulants: Secondary | ICD-10-CM | POA: Diagnosis not present

## 2022-03-10 DIAGNOSIS — E43 Unspecified severe protein-calorie malnutrition: Secondary | ICD-10-CM | POA: Insufficient documentation

## 2022-03-10 DIAGNOSIS — C670 Malignant neoplasm of trigone of bladder: Secondary | ICD-10-CM | POA: Diagnosis not present

## 2022-03-10 DIAGNOSIS — Z6835 Body mass index (BMI) 35.0-35.9, adult: Secondary | ICD-10-CM | POA: Diagnosis not present

## 2022-03-10 DIAGNOSIS — I5032 Chronic diastolic (congestive) heart failure: Secondary | ICD-10-CM | POA: Diagnosis not present

## 2022-03-10 DIAGNOSIS — I130 Hypertensive heart and chronic kidney disease with heart failure and stage 1 through stage 4 chronic kidney disease, or unspecified chronic kidney disease: Secondary | ICD-10-CM | POA: Diagnosis not present

## 2022-03-10 DIAGNOSIS — J90 Pleural effusion, not elsewhere classified: Secondary | ICD-10-CM | POA: Diagnosis not present

## 2022-03-10 DIAGNOSIS — G8929 Other chronic pain: Secondary | ICD-10-CM | POA: Diagnosis not present

## 2022-03-10 DIAGNOSIS — M1990 Unspecified osteoarthritis, unspecified site: Secondary | ICD-10-CM | POA: Diagnosis not present

## 2022-03-10 DIAGNOSIS — Z79891 Long term (current) use of opiate analgesic: Secondary | ICD-10-CM | POA: Diagnosis not present

## 2022-03-10 DIAGNOSIS — J9611 Chronic respiratory failure with hypoxia: Secondary | ICD-10-CM | POA: Diagnosis not present

## 2022-03-10 DIAGNOSIS — R918 Other nonspecific abnormal finding of lung field: Secondary | ICD-10-CM | POA: Diagnosis not present

## 2022-03-10 DIAGNOSIS — I4821 Permanent atrial fibrillation: Secondary | ICD-10-CM | POA: Diagnosis not present

## 2022-03-10 DIAGNOSIS — E669 Obesity, unspecified: Secondary | ICD-10-CM | POA: Diagnosis not present

## 2022-03-10 DIAGNOSIS — N183 Chronic kidney disease, stage 3 unspecified: Secondary | ICD-10-CM | POA: Diagnosis not present

## 2022-03-10 DIAGNOSIS — I872 Venous insufficiency (chronic) (peripheral): Secondary | ICD-10-CM | POA: Diagnosis not present

## 2022-03-10 DIAGNOSIS — I272 Pulmonary hypertension, unspecified: Secondary | ICD-10-CM | POA: Diagnosis not present

## 2022-03-10 DIAGNOSIS — Z960 Presence of urogenital implants: Secondary | ICD-10-CM | POA: Diagnosis not present

## 2022-03-10 DIAGNOSIS — Z9181 History of falling: Secondary | ICD-10-CM | POA: Diagnosis not present

## 2022-03-10 DIAGNOSIS — J9601 Acute respiratory failure with hypoxia: Secondary | ICD-10-CM | POA: Diagnosis not present

## 2022-03-10 DIAGNOSIS — G4733 Obstructive sleep apnea (adult) (pediatric): Secondary | ICD-10-CM | POA: Diagnosis not present

## 2022-03-10 DIAGNOSIS — Z993 Dependence on wheelchair: Secondary | ICD-10-CM | POA: Diagnosis not present

## 2022-03-10 LAB — BASIC METABOLIC PANEL
Anion gap: 10 (ref 5–15)
BUN: 25 mg/dL — ABNORMAL HIGH (ref 8–23)
CO2: 29 mmol/L (ref 22–32)
Calcium: 8.4 mg/dL — ABNORMAL LOW (ref 8.9–10.3)
Chloride: 101 mmol/L (ref 98–111)
Creatinine, Ser: 1.53 mg/dL — ABNORMAL HIGH (ref 0.61–1.24)
GFR, Estimated: 45 mL/min — ABNORMAL LOW (ref >60)
Glucose, Bld: 129 mg/dL — ABNORMAL HIGH (ref 70–99)
Potassium: 3.9 mmol/L (ref 3.5–5.1)
Sodium: 140 mmol/L (ref 135–145)

## 2022-03-10 LAB — URINE CULTURE: Culture: 60000 — AB

## 2022-03-10 LAB — BODY FLUID CELL COUNT WITH DIFFERENTIAL
Eos, Fluid: 0 %
Lymphs, Fluid: 31 %
Monocyte-Macrophage-Serous Fluid: 67 % (ref 50–90)
Neutrophil Count, Fluid: 2 % (ref 0–25)
Total Nucleated Cell Count, Fluid: 210 cu mm (ref 0–1000)

## 2022-03-10 LAB — CYTOLOGY - NON PAP

## 2022-03-10 LAB — PROTEIN, PLEURAL OR PERITONEAL FLUID: Total protein, fluid: <3 g/dL

## 2022-03-10 LAB — LACTATE DEHYDROGENASE, PLEURAL OR PERITONEAL FLUID: LD, Fluid: 120 U/L — ABNORMAL HIGH (ref 3–23)

## 2022-03-10 LAB — MAGNESIUM: Magnesium: 2 mg/dL (ref 1.7–2.4)

## 2022-03-10 MED ORDER — PIPERACILLIN-TAZOBACTAM 3.375 G IVPB
3.3750 g | Freq: Three times a day (TID) | INTRAVENOUS | Status: DC
Start: 2022-03-10 — End: 2022-03-10

## 2022-03-10 NOTE — TOC Initial Note (Signed)
Transition of Care [facility]) - Initial/Assessment Note    Patient Details  Name: Patrick Bishop MRN: [id number] Date of Birth: Oct 11, 1940  Transition of Care [facility]) CM/SW Contact:    Bethena Roys, RN Phone Number: 03/10/2022, 12:33 PM  Clinical Narrative: Risk for readmission assessment completed. PTA patient was from [facility] with his significant other. Patient has durable medical equipment (DME): cane, rolling walker and motorized wheelchair. DME bedside commode ordered for a riser over his commode from [employer]. DME will be delivered to the room prior to discharge. Patient is active with [facility] for PT- Added RN and OT for services. MD will place orders for [facility] and [facility]. Case Manager will continue to follow for additional transition of care needs.   Expected Discharge Plan: [place] Barriers to Discharge: Continued Medical Work up   Patient Goals and CMS Choice Patient states their goals for this hospitalization and ongoing recovery are:: to return to independent living facility.   Choice offered to / list presented to : Patient  Expected Discharge Plan and Services Expected Discharge Plan: [place] In-house Referral: NA Discharge Planning Services: CM Consult Post Acute Care Choice: Home Health, Resumption of Svcs/PTA Provider Living arrangements for the past 2 months: [place]                 DME Arranged: Bedside commode DME Agency: [employer] Date DME Agency Contacted: 03/10/22 Time DME Agency Contacted: [phone] Representative spoke with at DME Agency: Frazier Butt HH Arranged: PT, OT, RN [place] Agency: [place] ([place]) Date [place]: 03/10/22 Time [place]: 1200 Representative spoke with at [place]: [place] Arrangements/Services Living arrangements for the past 2 months: [place] Lives with:: Self, Significant  Other Patient language and need for interpreter reviewed:: Yes Do you feel safe going back to the place where you live?: Yes      Need for Family Participation in Patient Care: No (Comment) Care giver support system in place?: No (comment) Current home services: DME, Home PT Criminal Activity/Legal Involvement Pertinent to Current Situation/Hospitalization: No - Comment as needed  Activities of Daily Living Home Assistive Devices/Equipment: Oxygen ADL Screening (condition at time of admission) Patient's cognitive ability adequate to safely complete daily activities?: Yes Is the patient deaf or have difficulty hearing?: Yes Does the patient have difficulty seeing, even when wearing glasses/contacts?: No Does the patient have difficulty concentrating, remembering, or making decisions?: No Patient able to express need for assistance with ADLs?: Yes Does the patient have difficulty dressing or bathing?: Yes Independently performs ADLs?: No Communication: Independent Dressing (OT): Needs assistance Is this a change from baseline?: Change from baseline, expected to last <3days Grooming: Needs assistance Is this a change from baseline?: Change from baseline, expected to last <3 days Feeding: Independent Bathing: Needs assistance Is this a change from baseline?: Change from baseline, expected to last <3 days Toileting: Needs assistance Is this a change from baseline?: Change from baseline, expected to last <3 days In/Out Bed: Needs assistance Is this a change from baseline?: Change from baseline, expected to last <3 days Walks in Home: Needs assistance Is this a change from baseline?: Change from baseline, expected to last <3 days Does the patient have difficulty walking or climbing stairs?: Yes Weakness of Legs: Both Weakness of Arms/Hands: None  Permission Sought/Granted Permission sought to share information with : Case Manager, [place] granted to  share information with :  Yes, Verbal Permission Granted     Permission granted to share info w AGENCY: [facility])          Alcohol / Substance Use: Not Applicable Psych Involvement: No (comment)  Admission diagnosis:  Shortness of breath [R06.02] Hypoxia [R09.02] Acute respiratory failure with hypoxia ([place]) [J96.01] Acute congestive heart failure, unspecified heart failure type ([place]) [I50.9] Patient Active Problem List   Diagnosis Date Noted   Protein-calorie malnutrition, severe 03/10/2022   Pulmonary nodules 03/09/2022   Acute respiratory failure with hypoxia with large left pleural effusion  03/08/2022   Hypokalemia 03/08/2022   Pleural effusion, left 03/08/2022   Chronic venous stasis 03/08/2022   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[place]) 01/30/2022   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   Pressure injury of left heel, stage 2 ([place]) 09/17/2021   SIRS (systemic inflammatory response syndrome) ([place]) 09/17/2021   Chronic respiratory failure with hypoxia ([place]) 09/17/2021   Anemia of chronic disease 09/17/2021   Chronic diastolic CHF (congestive heart failure) ([place])    Pressure injury of skin 08/10/2021   Generalized weakness 08/09/2021   Cancer of trigone of urinary bladder ([place]) 11/05/2020   Goals of care, counseling/discussion 08/27/2019   Suspected COVID-19 virus infection    Shortness of breath 08/02/2019   Hydronephrosis with obstructing calculus 06/24/2019   Elevated troponin 06/24/2019   Chronic pain 07/12/2018   Hearing loss 07/12/2018   Lymphedema 07/12/2018   Muscle weakness 07/12/2018   Unsteady gait 07/12/2018   Morbid obesity ([place]) 07/08/2018   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 -  11/14/2017   Chronic diastolic HF (heart failure) (HCC)    Chronic venous insufficiency    Permanent atrial fibrillation ([place]); CHA2DS2-VASc score =5 (agex2, aortic plaque, CHF, HTN)    OSA on CPAP    Pulmonary hypertension ([place])     Hyperlipidemia with target LDL less than 100    PCP:  Lujean Amel, MD Pharmacy:   [facility] - [address], [place] [address] Phone: [phone] Fax: [phone] - [place] - [address] [place] [address] Phone: [phone] Fax: [phone]  [facility] [place], [place] [address] Phone: [phone] Fax: [phone]  Readmission Risk Interventions    03/10/2022   12:19 PM  Readmission Risk Prevention Plan  Transportation Screening Complete  Medication Review ([place]) Complete  PCP or Specialist appointment within 3-5 days of discharge Complete  HRI or [place] Complete  SW Recovery Care/Counseling Consult Complete  [place] Not Applicable

## 2022-03-10 NOTE — Progress Notes (Signed)
I was asked by Dr. Maylene Roes to comment on recent development including recent pathology results.  This is a 81 year old gentleman known to me with history of bladder cancer treated with radiation and carboplatin.  Patient hospitalized with symptoms of shortness of breath and was found to have pleural effusion.  CT scan obtained on 03/08/2022 was personally reviewed and showed new enlarging pulmonary nodules and thoracic adenopathy as well as pleural effusion.  He underwent thoracentesis with the cytology consistent with malignant cells.  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  He will need to complete his staging scans with abdomen and pelvis imaging or a PET scan which can be done as an outpatient.  At this time, he would be a marginal candidate for systemic treatment but would be reasonable to consider immunotherapy as an outpatient.  We will arrange follow-up upon his discharge to discuss these findings and treatment choices.  If his pleural effusion reaccumulates he might require Pleurx catheter.  We will continue to follow his progress and arrange immediate follow-up upon his discharge.

## 2022-03-10 NOTE — Progress Notes (Signed)
NAME:  Patrick Bishop, MRN:  [id number], DOB:  [birth year], LOS: 2 ADMISSION DATE:  03/08/2022, CONSULTATION DATE:  03/08/22 REFERRING MD:  Rogers Blocker, CHIEF COMPLAINT:  SOB   History of Present Illness:  81 year old man with 60 pack year smoking hx, prior bladder cancer, OSA, dHF presenting with insidious onset DOE, orthopnea.  +associated dry cough.  No preceding illnesses.  No concurrent fevers.  No hx of chest wall trauma.  + weight loss 100 lbs over past year he states intentional. No chest pain.  CXR with mostly L sided effusion new from April 2023.  PCCM consulted for further management.    Pertinent  Medical History  AAA Arthritis Bladder cancer OSA on CPAP HTN Mild post COVID fibrosis  Significant Hospital Events: Including procedures, antibiotic start and stop dates in addition to other pertinent events   6/13 admitted , left thoracentesis 2.1 L of bloody fluid removed Pleural fluid results high LDH, low protein, predominant neutrophils, Gram stain negative CT chest without contrast 6/13 moderate right effusion, small left effusion, extensive thoracic lymphadenopathy, right paratracheal 2.1 cm lymph node, trace left pneumothorax thiamine B1 Foley cath likely , multiple pulmonary nodules, right upper lobe 1.2 and 1.3 cm, left lower lobe 1.7 cm  Interim History / Subjective:   Afebrile Complains of dyspnea No chest pain   Objective   Blood pressure (!) 114/93, pulse 94, temperature 97.7 F (36.5 C), temperature source Oral, resp. rate 20, height '6\' 1"'$  (1.854 m), weight 101.4 kg, SpO2 98 %.        Intake/Output Summary (Last 24 hours) at 03/10/2022 1216 Last data filed at 03/10/2022 0500 Gross per 24 hour  Intake --  Output 330 ml  Net -330 ml    Filed Weights   03/08/22 1159 03/09/22 0356 03/10/22 0510  Weight: 99.8 kg 100.7 kg 101.4 kg    Examination: General: Frail elderly man in NAD HENT: MMM, trachea midline Lungs: No accessory muscle use, decreased breath  sounds on right Cardiovascular: S1-S2 irregular, no murmur Abdomen: soft, +BS Extremities: Chronic venous stasis changes with minimal overlying edema Neuro: Alert, interactive, nonfocal very hard of hearing Skin: scattered bruising MSK: kyphosis and some sort of chest wall deformity along L shoulder  Resolved Hospital Problem list   N/A  Assessment & Plan:   Acute hypoxic respiratory failure Bilateral pleural effusion- bloody on Lt tap.  Pleural fluid cytology shows carcinoma, favor urothelial origin -Can perform a right thoracentesis for symptomatic relief  Metastatic bladder cancer Pulmonary nodules and mediastinal lymphadenopathy -atypical for bladder cancer to cause mediastinal lymphadenopathy -Await oncology opinion but doubt we would have to pursue diagnosis of primary lung cancer   HFpEF CKD stage IIIb [place]and ischemia -Per primary team  Discussed above plan with patient and hospitalist  PCCM will be available as needed.  PleurX catheter will be considered if fluid reaccumulates  Best Practice (right click and "Reselect all SmartList Selections" daily)  Per primary  Labs   CBC: Recent Labs  Lab 03/08/22 1226 03/08/22 1745 03/09/22 0426  WBC 6.5  --  7.2  NEUTROABS 5.5  --   --   HGB 11.0* 10.9* 11.6*  HCT 34.8* 32.0* 36.1*  MCV 97.2  --  96.8  PLT 289  --  257     Basic Metabolic Panel: Recent Labs  Lab 03/08/22 1226 03/08/22 1447 03/08/22 1745 03/09/22 0426 03/10/22 0220  NA 143  --  144 141 140  K 3.3*  --  3.2* 3.1* 3.9  CL 101  --   --  100 101  CO2 32  --   --  32 29  GLUCOSE 146*  --   --  134* 129*  BUN 21  --   --  24* 25*  CREATININE 1.79*  --   --  1.73* 1.53*  CALCIUM 8.7*  --   --  8.3* 8.4*  MG  --  2.1  --   --  2.0    GFR: Estimated Creatinine Clearance: 47.4 mL/min (A) (by C-G formula based on SCr of 1.53 mg/dL (H)). Recent Labs  Lab 03/08/22 1226 03/09/22 0426  WBC 6.5 7.2     Liver Function Tests: Recent Labs   Lab 03/08/22 1226  AST 13*  ALT 9  ALKPHOS 76  BILITOT 1.4*  PROT 6.1*  ALBUMIN 2.7*    No results for input(s): "LIPASE", "AMYLASE" in the last 168 hours. No results for input(s): "AMMONIA" in the last 168 hours.  ABG    Component Value Date/Time   HCO3 38.7 (H) 03/09/2022 0426   TCO2 37 (H) 03/08/2022 1745   O2SAT 42 03/09/2022 0426     Coagulation Profile: Recent Labs  Lab 03/08/22 1732  INR 1.5*     Cardiac Enzymes: No results for input(s): "CKTOTAL", "CKMB", "CKMBINDEX", "TROPONINI" in the last 168 hours.  HbA1C: Hgb A1c MFr Bld  Date/Time Value Ref Range Status  09/17/2021 03:12 AM 5.4 4.8 - 5.6 % Final    Comment:    (NOTE) Pre diabetes:          5.7%-6.4%  Diabetes:              >6.4%  Glycemic control for   <7.0% adults with diabetes   10/15/2020 02:55 PM 5.4 4.8 - 5.6 % Final    Comment:    (NOTE) Pre diabetes:          5.7%-6.4%  Diabetes:              >6.4%  Glycemic control for   <7.0% adults with diabetes     CBG: No results for input(s): "GLUCAP" in the last 168 hours.   Kara Mead MD. Shade Flood. [place] [facility] Pager : [phone]  If no response to pager , please call [phone] until 7 pm After 7:00 pm call [place]  [phone]   03/10/2022

## 2022-03-10 NOTE — Progress Notes (Signed)
Patient seen and examined personally today after he has completed thoracentesis.  He feels comfortable at this time.  He is seeing a previous for details.  His disease status was updated and discussed today in detail.  He understands he has an incurable malignancy likely primary bladder primary.  Treatment options are very limited he would be a reasonable candidate for single agent immunotherapy which might offer temporary palliation.  Supportive care could result in further deterioration necessitating hospice later.  This is a reasonable option for him well if he opts against  For the time being, continue supportive care and he will consider his options and will discuss this as an outpatient upon his discharge.  Outpatient follow-up will be arranged.  I do not recommend any further imaging at this time.   35  minutes were dedicated to this encounter.  50% of time was face-to-face.  The time was spent on reviewing laboratory data, imaging studies, discussing treatment options, and answering questions regarding future plan.

## 2022-03-10 NOTE — Progress Notes (Signed)
PROGRESS NOTE    Ziyon Soltau  GUY:[id number] DOB: November 27, 1940 DOA: 03/08/2022 PCP: Lujean Amel, MD     Brief Narrative:  Bertil Brickey is a 81 y.o. 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  His girlfriend states he has had some shortness of breath over the last few few days, but got acutely worse this AM. He was gasping for air, but was able to talk in full sentences.  She states he has hardly eaten for weeks and has been in bed. She states he has lost over 100 pounds over the past 6 months. They deny any swelling in legs and state his legs have actually had improvement in the swelling. No coughing. He does endorse orthopnea. He does not eat much salt. No recent illness. He has not felt like he needed any extra demadex.  Back in April his cardiologist reduced his torsemide to once daily and only used second dose PRN, was '20mg'$  BID scheduled. Also weaned off of his metoprolol due to hypotension.   Chest x-ray revealed bilateral pleural effusion, moderately large left pleural effusion.  PCCM was consulted.  Patient underwent left-sided thoracentesis on 6/13.  CT chest revealed multiple pulmonary nodules and right-sided pleural effusion, small left pneumothorax.  New events last 24 hours / Subjective: Patient without any new complaints today.  Denies any shortness of breath.  Cytology revealed adenocarcinoma, consistent with metastatic carcinoma of urothelial origin.  Assessment & Plan:  Principal Problem:   Acute respiratory failure with hypoxia with large left pleural effusion  Active Problems:   Chronic diastolic CHF (congestive heart failure) (HCC)   Hypokalemia   Elevated troponin   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HCC)   Permanent atrial  fibrillation (HCC); CHA2DS2-VASc score =5 (agex2, aortic plaque, CHF, HTN)   Anemia of chronic disease   Pulmonary hypertension (HCC)   Hyperlipidemia with target LDL less than 100   Ectatic abdominal aorta (HCC)   OSA on CPAP   Pleural effusion, left   Chronic venous stasis   Pulmonary nodules   Protein-calorie malnutrition, severe   Acute hypoxemic respiratory failure with bilateral pleural effusion -COVID-negative -Status post left thoracentesis 6/13, resulted 2100 cc of bloody fluid.  Exudative fluid per lights criteria.  Cytology consistent with metastatic urothelial carcinoma -Pulse ox dropped to 85% on room air, required 4 L nasal cannula O2.  Now on 2 L -Consider therapeutic right thoracentesis prior to discharge  Multiple pulmonary nodules and new thoracic adenopathy, consistent with metastatic urothelial cancer -Appreciate Dr. Alen Blew weighing in.  He will arrange for outpatient follow-up with staging scans as outpatient  Chronic diastolic heart failure -Patient was given Lasix in the emergency department -BNP 100.7 -Echocardiogram showed EF 55 to 56%, diastolic parameters indeterminant, normal pulmonary artery systolic pressure -Holding home Demadex due to hypotension   CKD stage IIIb -Baseline creatinine 2 -Stable  Demand ischemia -Likely in setting of respiratory failure -Without any chest pain, troponin trend is flat 21-->21, not consistent with ACS  Permanent A-fib -CHA2DS2-VASc score 5 -Xarelto on hold due to possible right-sided thoracentesis -Continue to monitor on telemetry  Chronic hypotension -Midodrine   Hyperlipidemia -Lipitor  OSA -CPAP nightly  GERD -PPI  Ectatic abdominal aorta -Follow-up outpatient  Asymptomatic bacteriuria -Urine culture shows 60,000 colonies of E. coli, ESBL.  Patient without signs or symptoms of dysuria, fevers, leukocytosis   Nutrition Problem: Severe Malnutrition Etiology: chronic illness   DVT prophylaxis:   Place and maintain sequential compression device Start: 03/08/22 1637 Place TED hose Start: 03/08/22 1554  Code Status: DNR Family Communication: None at bedside Disposition Plan:  Status is: Inpatient Remains inpatient appropriate because: Possible right-sided thoracentesis.  Likely discharge back to independent living facility with home health 6/16   Antimicrobials:  Anti-infectives (From admission, onward)    None        Objective: Vitals:   03/09/22 1942 03/10/22 0056 03/10/22 0510 03/10/22 0756  BP: 93/62 95/63 101/68 (!) 114/93  Pulse: (!) 107 96  94  Resp: '20 20  20  '$ Temp: 98.2 F (36.8 C) 97.8 F (36.6 C) 97.7 F (36.5 C)   TempSrc: Oral Oral Oral   SpO2: 98% 97% 96% 98%  Weight:   101.4 kg   Height:        Intake/Output Summary (Last 24 hours) at 03/10/2022 1339 Last data filed at 03/10/2022 1220 Gross per 24 hour  Intake --  Output 530 ml  Net -530 ml    Filed Weights   03/08/22 1159 03/09/22 0356 03/10/22 0510  Weight: 99.8 kg 100.7 kg 101.4 kg    Examination:  General exam: Appears calm and comfortable  Respiratory system: Crackles left base, diminished breath sounds on right Cardiovascular system: S1 & S2 heard, irregular rhythm Gastrointestinal system: Abdomen is nondistended, soft and nontender. Normal bowel sounds heard. Central nervous system: Alert and oriented. No focal neurological deficits. Speech clear.  Extremities: Symmetric in appearance  Skin: Bilateral lower extremities with bruising, chronic venous stasis Psychiatry: Judgement and insight appear normal. Mood & affect appropriate.   Data Reviewed: I have personally reviewed following labs and imaging studies  CBC: Recent Labs  Lab 03/08/22 1226 03/08/22 1745 03/09/22 0426  WBC 6.5  --  7.2  NEUTROABS 5.5  --   --   HGB 11.0* 10.9* 11.6*  HCT 34.8* 32.0* 36.1*  MCV 97.2  --  96.8  PLT 289  --  751    Basic Metabolic Panel: Recent Labs  Lab 03/08/22 1226  03/08/22 1447 03/08/22 1745 03/09/22 0426 03/10/22 0220  NA 143  --  144 141 140  K 3.3*  --  3.2* 3.1* 3.9  CL 101  --   --  100 101  CO2 32  --   --  32 29  GLUCOSE 146*  --   --  134* 129*  BUN 21  --   --  24* 25*  CREATININE 1.79*  --   --  1.73* 1.53*  CALCIUM 8.7*  --   --  8.3* 8.4*  MG  --  2.1  --   --  2.0    GFR: Estimated Creatinine Clearance: 47.4 mL/min (A) (by C-G formula based on SCr of 1.53 mg/dL (H)). Liver Function Tests: Recent Labs  Lab 03/08/22 1226  AST 13*  ALT 9  ALKPHOS 76  BILITOT 1.4*  PROT 6.1*  ALBUMIN 2.7*    No results for input(s): "LIPASE", "AMYLASE" in the last 168 hours. No results for input(s): "AMMONIA" in the last 168 hours. Coagulation Profile: Recent Labs  Lab 03/08/22 1732  INR 1.5*    Cardiac Enzymes: No results for input(s): "CKTOTAL", "CKMB", "CKMBINDEX", "TROPONINI" in the last 168 hours. BNP (last 3 results) No results for input(s): "PROBNP" in the last 8760 hours. HbA1C: No results for input(s): "HGBA1C" in  the last 72 hours. CBG: No results for input(s): "GLUCAP" in the last 168 hours. Lipid Profile: No results for input(s): "CHOL", "HDL", "LDLCALC", "TRIG", "CHOLHDL", "LDLDIRECT" in the last 72 hours. Thyroid Function Tests: Recent Labs    03/08/22 1732  TSH 0.913    Anemia Panel: No results for input(s): "VITAMINB12", "FOLATE", "FERRITIN", "TIBC", "IRON", "RETICCTPCT" in the last 72 hours. Sepsis Labs: No results for input(s): "PROCALCITON", "LATICACIDVEN" in the last 168 hours.  Recent Results (from the past 240 hour(s))  SARS Coronavirus 2 by RT PCR (hospital order, performed in [facility] hospital lab) *cepheid single result test* Anterior Nasal Swab     Status: None   Collection Time: 03/08/22  3:19 PM   Specimen: Anterior Nasal Swab  Result Value Ref Range Status   SARS Coronavirus 2 by RT PCR NEGATIVE NEGATIVE Final    Comment: (NOTE) SARS-CoV-2 target nucleic acids are NOT DETECTED.  The  SARS-CoV-2 RNA is generally detectable in upper and lower respiratory specimens during the acute phase of infection. The lowest concentration of SARS-CoV-2 viral copies this assay can detect is 250 copies / mL. A negative result does not preclude SARS-CoV-2 infection and should not be used as the sole basis for treatment or other patient management decisions.  A negative result may occur with improper specimen collection / handling, submission of specimen other than nasopharyngeal swab, presence of viral mutation(s) within the areas targeted by this assay, and inadequate number of viral copies (<250 copies / mL). A negative result must be combined with clinical observations, patient history, and epidemiological information.  Fact Sheet for Patients:   https://www.patel.info/  Fact Sheet for Healthcare Providers: https://hall.com/  This test is not yet approved or  cleared by the [place] FDA and has been authorized for detection and/or diagnosis of SARS-CoV-2 by FDA under an Emergency Use Authorization (EUA).  This EUA will remain in effect (meaning this test can be used) for the duration of the COVID-19 declaration under Section 564(b)(1) of the Act, 21 U.S.C. section 360bbb-3(b)(1), unless the authorization is terminated or revoked sooner.  Performed at [facility], [place] [address] [place], [place] [address]   Body fluid culture w Gram Stain     Status: None (Preliminary result)   Collection Time: 03/08/22  4:30 PM   Specimen: Pleural Fluid  Result Value Ref Range Status   Specimen Description PLEURAL  Final   Special Requests NONE  Final   Gram Stain   Final    FEW WBC PRESENT, PREDOMINANTLY PMN NO ORGANISMS SEEN    Culture   Final    NO GROWTH 2 DAYS Performed at [facility], [place] [address] [place], [place] [address]    Report Status PENDING  Incomplete  Urine Culture     Status: Abnormal (Preliminary  result)   Collection Time: 03/08/22 10:28 PM   Specimen: Urine, Clean Catch  Result Value Ref Range Status   Specimen Description URINE, CLEAN CATCH  Final   Special Requests NONE  Final   Culture (A)  Final    60,000 COLONIES/mL ESCHERICHIA COLI Confirmed Extended Spectrum Beta-Lactamase Producer (ESBL).  In bloodstream infections from ESBL organisms, carbapenems are preferred over piperacillin/tazobactam. They are shown to have a lower risk of mortality. CULTURE REINCUBATED FOR BETTER GROWTH Performed at [facility], [place] [address] [place], [place] [address]    Report Status PENDING  Incomplete   Organism ID, Bacteria ESCHERICHIA COLI (A)  Final  Susceptibility   Escherichia coli - MIC*    AMPICILLIN >=32 RESISTANT Resistant     CEFAZOLIN >=64 RESISTANT Resistant     CEFEPIME 16 RESISTANT Resistant     CEFTRIAXONE >=64 RESISTANT Resistant     CIPROFLOXACIN >=4 RESISTANT Resistant     GENTAMICIN >=16 RESISTANT Resistant     IMIPENEM <=0.25 SENSITIVE Sensitive     NITROFURANTOIN 32 SENSITIVE Sensitive     TRIMETH/SULFA >=320 RESISTANT Resistant     AMPICILLIN/SULBACTAM >=32 RESISTANT Resistant     PIP/TAZO 16 SENSITIVE Sensitive     * 60,000 COLONIES/mL ESCHERICHIA COLI      Radiology Studies: ECHOCARDIOGRAM COMPLETE  Result Date: 03/09/2022    ECHOCARDIOGRAM REPORT   Patient Name:   BENJAMAN ARTMAN Date of Exam: 03/09/2022 Medical Rec #:  [id number]       Height:       73.0 in Accession #:    [id number]      Weight:       222.0 lb Date of Birth:  27-Feb-1941       BSA:          2.249 m Patient Age:    28 years        BP:           94/62 mmHg Patient Gender: M               HR:           86 bpm. Exam Location:  Inpatient Procedure: 2D Echo, Cardiac Doppler, Color Doppler and Intracardiac            Opacification Agent Indications:    CHF-Acute Diastolic C62.37  History:        Patient has prior history of Echocardiogram examinations, most                 recent 08/10/2021.  Pulmonary HTN; Risk Factors:Hypertension and                 Dyslipidemia. Chronic kidney disease. AAA.  Sonographer:    Darlina Sicilian RDCS Referring Phys: [id number] [facility]  Sonographer Comments: Technically difficult study due to poor echo windows. IMPRESSIONS  1. Very poor acoustic windows. Even with Definity use, difficult.     OVerall LVEF appears normal (approximately 55 to 60%). Left ventricular diastolic parameters are indeterminate.  2. Right ventricular systolic function is normal. The right ventricular size is normal. There is normal pulmonary artery systolic pressure.  3. The mitral valve is normal in structure. Trivial mitral valve regurgitation.  4. The aortic valve is tricuspid. Aortic valve regurgitation is not visualized. FINDINGS  Left Ventricle: Very poor acoustic windows. Even with Definity use, difficult. OVerall LVEF appears normal (approximately 55 to 60%). Definity contrast agent was given IV to delineate the left ventricular endocardial borders. The left ventricular internal cavity size was normal in size. Suboptimal image quality limits for assessment of left ventricular hypertrophy. Left ventricular diastolic parameters are indeterminate. Right Ventricle: The right ventricular size is normal. Right vetricular wall thickness was not assessed. Right ventricular systolic function is normal. There is normal pulmonary artery systolic pressure. The tricuspid regurgitant velocity is 2.20 m/s, and with an assumed right atrial pressure of 8 mmHg, the estimated right ventricular systolic pressure is 76.1 mmHg. Left Atrium: Left atrial size was normal in size. Right Atrium: Right atrial size was normal in size. Pericardium: There is no evidence of pericardial effusion. Mitral Valve: The mitral valve is normal in structure. Trivial mitral  valve regurgitation. Tricuspid Valve: The tricuspid valve is normal in structure. Tricuspid valve regurgitation is trivial. Aortic Valve: The aortic valve is  tricuspid. Aortic valve regurgitation is not visualized. Pulmonic Valve: The pulmonic valve was not well visualized. Pulmonic valve regurgitation is not visualized. Aorta: The aortic root is normal in size and structure. IAS/Shunts: No atrial level shunt detected by color flow Doppler.  RIGHT VENTRICLE RV S prime:     11.30 cm/s TAPSE (M-mode): 1.2 cm LEFT ATRIUM            Index        RIGHT ATRIUM           Index LA Vol (A4C): 100.0 ml 44.46 ml/m  RA Area:     16.00 cm                                     RA Volume:   34.50 ml  15.34 ml/m  AORTIC VALVE LVOT Vmax:   57.10 cm/s LVOT Vmean:  42.400 cm/s LVOT VTI:    0.092 m MITRAL VALVE               TRICUSPID VALVE MV Area (PHT): 4.49 cm    TR Peak grad:   19.4 mmHg MV Decel Time: 169 msec    TR Vmax:        220.00 cm/s MV E velocity: 75.73 cm/s                            SHUNTS                            Systemic VTI: 0.09 m Dorris Carnes MD Electronically signed by Dorris Carnes MD Signature Date/Time: 03/09/2022/1:53:14 PM    Final    DG Chest Port 1 View  Result Date: 03/09/2022 CLINICAL DATA:  Encounter for hemothorax. EXAM: PORTABLE CHEST 1 VIEW COMPARISON:  Chest CT yesterday at 5:01 p.m., portable chest yesterday at 6:15 p.m. FINDINGS: 4:58 a.m., 03/09/2022. The cardiac size is normal. There is a stable mediastinal configuration. There is aortic atherosclerosis. Small left and moderate right pleural effusions are again noted with retrocardiac left lower lobe atelectasis or consolidation and hazy interstitial change overlying the right pleural effusion. There is a trace left apicolateral pneumothorax similar to the previous study. No right pneumothorax. Remainder of the lungs are clear. Right upper lobe pulmonary nodules noted on CT are not visible radiographically. There is a left lower lobe nodule which is also not visible radiographically. No acute osseous abnormality is seen. There is advanced right glenohumeral DJD. IMPRESSION: Moderate-sized right and  small left pleural effusions and overlying lung opacities. Stable overall aeration. Trace left apicolateral pneumothorax. Pulmonary nodules are not visible on x-ray. Electronically Signed   By: Telford Nab M.D.   On: 03/09/2022 06:11   DG Chest Port 1 View  Result Date: 03/08/2022 CLINICAL DATA:  Shortness of breath EXAM: PORTABLE CHEST 1 VIEW COMPARISON:  Previous studies including CT and chest radiograph done earlier today FINDINGS: Transverse diameter of heart is slightly increased. There is significant interval decrease in left pleural effusion. Small left apical pneumothorax seen in the CT is difficult to visualize. Small right pleural effusion is seen. Increased markings are seen in the medial aspect of both lower lung fields and right parahilar region. Degenerative changes  are noted in the right shoulder and both AC joints. IMPRESSION: There is interval decrease in left pleural effusion. Increased markings in the right parahilar region and both lower lung fields may suggest atelectasis/pneumonia. Electronically Signed   By: Elmer Picker M.D.   On: 03/08/2022 18:35   CT CHEST WO CONTRAST  Result Date: 03/08/2022 CLINICAL DATA:  Pneumonia. Complication suspected * Tracking Code: BO * EXAM: CT CHEST WITHOUT CONTRAST TECHNIQUE: Multidetector CT imaging of the chest was performed following the standard protocol without IV contrast. RADIATION DOSE REDUCTION: This exam was performed according to the departmental dose-optimization program which includes automated exposure control, adjustment of the mA and/or kV according to patient size and/or use of iterative reconstruction technique. COMPARISON:  Plain film of earlier today.  Chest CT 08/05/2021. FINDINGS: Cardiovascular: Aortic atherosclerosis. Tortuous thoracic aorta. Mild cardiomegaly. Left main and 3 vessel coronary artery calcification. Pulmonary artery enlargement, outflow tract 3.4 cm Mediastinum/Nodes: Extensive thoracic adenopathy. Right  paratracheal 2.1 cm node on 50/3 is new. Hilar regions poorly evaluated without intravenous contrast. Lungs/Pleura: Moderate right and small left pleural effusions, new since the prior CT. Trace left-sided pneumothorax, including at the apex on 23/5 and within the anterior inferior left chest on 101/5. Right upper lobe pulmonary nodules x2 including at 1.2 and 1.3 cm on 67/5. At the site of a 4 mm nodule on 08/05/2021. Mild to moderate right hemidiaphragm elevation. Bibasilar compressive atelectasis. More cephalad 7 mm right upper lobe pulmonary nodule on 48/5, new. Anteromedial right upper lobe pleural-based 9 mm pulmonary nodule on 42/5, new. Suspect a left lower lobe pulmonary nodule of 1.7 cm on 107/5. This is surrounded by atelectasis. Upper Abdomen: Subtle dependent gallstone. Normal noncontrast appearance of the imaged liver, spleen, stomach, pancreas, adrenal glands. Bilateral renal atrophy Musculoskeletal: left chest wall lipoma is incompletely imaged including at 17.4 cm on 48/3. 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 IMPRESSION: 1. Constellation of findings, including new and enlarged pulmonary nodules, new thoracic adenopathy which are highly suspicious for neoplasm, either metastatic disease or 1 or more primary bronchogenic carcinomas with nodal metastasis. 2. New right larger than left pleural effusions with compressive atelectasis. 3. Trace left-sided pneumothorax. 4. Coronary artery atherosclerosis. Aortic Atherosclerosis (ICD10-I70.0). 5. Bilateral renal atrophy 6. Cholelithiasis 7. Pulmonary artery enlargement suggests pulmonary arterial hypertension. Electronically Signed   By: Abigail Miyamoto M.D.   On: 03/08/2022 17:35      Scheduled Meds:  atorvastatin  10 mg Oral Daily   feeding supplement  237 mL Oral TID BM   midodrine  5 mg Oral TID WC   pantoprazole  40 mg Oral BID   sodium chloride flush  3 mL Intravenous Q12H   tamsulosin  0.4 mg Oral Daily   Continuous Infusions:  sodium chloride        LOS: 2 days     Dessa Phi, DO [facility] 03/10/2022, 1:39 PM   Available via Epic secure chat 7am-7pm After these hours, please refer to coverage provider listed on [email or web address]

## 2022-03-10 NOTE — Procedures (Signed)
Thoracentesis  Procedure Note  Patrick Bishop  [id number]  1941-09-03  Date:03/10/22  Time:2:40 PM   Provider Performing:Aryanne Gilleland V. [place]onga Ponder   Procedure: Thoracentesis with imaging guidance (40981)  Indication(s) Pleural Effusion  Consent Risks of the procedure as well as the alternatives and risks of each were explained to the patient and/or caregiver.  Consent for the procedure was obtained and is signed in the bedside chart  Anesthesia Topical only with 1% lidocaine    Time Out Verified patient identification, verified procedure, site/side was marked, verified correct patient position, special equipment/implants available, medications/allergies/relevant history reviewed, required imaging and test results available.   Sterile Technique Maximal sterile technique including full sterile barrier drape, hand hygiene, sterile gown, sterile gloves, mask, hair covering, sterile ultrasound probe cover (if used).  Procedure Description Ultrasound was used to identify appropriate pleural anatomy for placement and overlying skin marked.  Area of drainage cleaned and draped in sterile fashion. Lidocaine was used to anesthetize the skin and subcutaneous tissue.  950 cc's of clear yellow appearing fluid was drained from the right pleural space. Catheter then removed and bandaid applied to site.   Complications/Tolerance None; patient tolerated the procedure well. Chest X-ray is ordered to confirm no post-procedural complication.   EBL Minimal   Specimen(s) Pleural fluid for cell count, chemistry & cytology  Anjalina Bergevin V. Elsworth Soho MD

## 2022-03-10 NOTE — Progress Notes (Signed)
Assisted Dr Patrick Bishop with thoracentesis.  Patient tolerated well.  RN to send plueral fluid to lab.  Awaiting PCXR.

## 2022-03-10 NOTE — Progress Notes (Signed)
Patient refused CPAP HS tonight 

## 2022-03-10 NOTE — Plan of Care (Signed)
  Problem: Cardiac: Goal: Ability to achieve and maintain adequate cardiopulmonary perfusion will improve Outcome: Progressing   Problem: Health Behavior/Discharge Planning: Goal: Ability to manage health-related needs will improve Outcome: Progressing   Problem: Education: Goal: Knowledge of General Education information will improve Description: Including pain rating scale, medication(s)/side effects and non-pharmacologic comfort measures Outcome: Progressing   Problem: Clinical Measurements: Goal: Respiratory complications will improve Outcome: Progressing   Problem: Activity: Goal: Risk for activity intolerance will decrease Outcome: Progressing   Problem: Pain Managment: Goal: General experience of comfort will improve Outcome: Progressing   Problem: Safety: Goal: Ability to remain free from injury will improve Outcome: Progressing   Problem: Skin Integrity: Goal: Risk for impaired skin integrity will decrease Outcome: Progressing

## 2022-03-11 DIAGNOSIS — J9601 Acute respiratory failure with hypoxia: Secondary | ICD-10-CM | POA: Diagnosis not present

## 2022-03-11 LAB — BASIC METABOLIC PANEL
Anion gap: 8 (ref 5–15)
BUN: 23 mg/dL (ref 8–23)
CO2: 31 mmol/L (ref 22–32)
Calcium: 8.7 mg/dL — ABNORMAL LOW (ref 8.9–10.3)
Chloride: 102 mmol/L (ref 98–111)
Creatinine, Ser: 1.47 mg/dL — ABNORMAL HIGH (ref 0.61–1.24)
GFR, Estimated: 48 mL/min — ABNORMAL LOW (ref >60)
Glucose, Bld: 112 mg/dL — ABNORMAL HIGH (ref 70–99)
Potassium: 4.5 mmol/L (ref 3.5–5.1)
Sodium: 141 mmol/L (ref 135–145)

## 2022-03-11 LAB — BODY FLUID CULTURE W GRAM STAIN: Culture: NO GROWTH

## 2022-03-11 LAB — CYTOLOGY - NON PAP

## 2022-03-11 MED ORDER — MIDODRINE HCL 5 MG PO TABS
5.0000 mg | ORAL_TABLET | Freq: Three times a day (TID) | ORAL | 0 refills | Status: AC
Start: 2022-03-11 — End: 2022-04-10

## 2022-03-11 NOTE — Progress Notes (Signed)
Physical Therapy Treatment Patient Details Name: Patrick Bishop MRN: [id number] DOB: [birth year] Today's Date: 03/11/2022   History of Present Illness 81 y.o. male admitted from [facility] 03/09/22 with worsening SOB, decreased oral intake. Pt with acute respiratory failure with bil pleural effusions, acute on chronic CHF. Thoracentesis 6/14. 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    PT Comments    Pt pleasant and initially hesitant for mobility stating he plans to D/C today with ambulance transport. Pt agreeable to attempt standing to get OOB to chair but after standing trial and incontinence pt returned to supine and refused further transfers with total assist for pericare and education for bed level HEP. Will continue to follow with pt stating he is content with bed level mobility and that girlfriend can provide needed assist, refuses SNF.     Recommendations for follow up therapy are one component of a multi-disciplinary discharge planning process, led by the attending physician.  Recommendations may be updated based on patient status, additional functional criteria and insurance authorization.  Follow Up Recommendations  Home health PT (pt refuses SNF and states girlfriend provides bed level assist)     Assistance Recommended at Discharge Intermittent Supervision/Assistance  Patient can return home with the following A lot of help with walking and/or transfers;A lot of help with bathing/dressing/bathroom;Assistance with cooking/housework;Assist for transportation;Help with stairs or ramp for entrance   Equipment Recommendations  Hospital bed;BSC/3in1    Recommendations for Other Services       Precautions / Restrictions Precautions Precautions: Fall;Other (comment) Precaution Comments: urine and bowel incontinence; watch BP Restrictions Weight Bearing Restrictions: No      Mobility  Bed Mobility Overal bed mobility: Needs Assistance Bed Mobility: Rolling, Sidelying to Sit, Sit to Supine Rolling: Min guard (with rail) Sidelying to sit: Min assist, HOB elevated (HOB 15 degrees)   Sit to supine: Min assist   General bed mobility comments: guarding to roll bil for pericare with return to bed, min assist to lift trunk from surface for side to sit and to lift legs to return to supine    Transfers Overall transfer level: Needs assistance   Transfers: Sit to/from Stand Sit to Stand: Min assist, From elevated surface           General transfer comment: min assist to rise from elevated surface to RW with increased time. Pt incontinent of stool with standing and tried to attempt 2nd time to place bed pan but pt unable and statng fatigue with pt returning to supine despite cues for progressive activity    Ambulation/Gait               General Gait Details: unable   Stairs             Wheelchair Mobility    Modified Rankin (Stroke Patients Only)       Balance Overall balance assessment: Needs assistance Sitting-balance support: No upper extremity supported, Feet supported Sitting balance-Leahy Scale: Fair Sitting balance - Comments: static sitting without support   Standing balance support: Bilateral upper extremity supported Standing balance-Leahy Scale: Poor Standing balance comment: reliant on RW and limited tolerance                            Cognition Arousal/Alertness: Awake/alert Behavior During Therapy: WFL for tasks assessed/performed Overall Cognitive Status: Impaired/Different from baseline Area of Impairment: Safety/judgement  Safety/Judgement: Decreased awareness of deficits, Decreased awareness of safety              Exercises General Exercises - Lower Extremity Short Arc Quad: AROM, Both, 10 reps, Supine Heel Slides: AROM, Both, 10 reps, Supine Hip  ABduction/ADduction: AROM, Both, 10 reps, Supine    General Comments        Pertinent Vitals/Pain Pain Assessment Pain Assessment: No/denies pain    Home Living                          Prior Function            PT Goals (current goals can now be found in the care plan section) Progress towards PT goals: Progressing toward goals    Frequency    Min 3X/week      PT Plan Current plan remains appropriate    Co-evaluation              AM-PAC PT "6 Clicks" Mobility   Outcome Measure  Help needed turning from your back to your side while in a flat bed without using bedrails?: A Little Help needed moving from lying on your back to sitting on the side of a flat bed without using bedrails?: A Little Help needed moving to and from a bed to a chair (including a wheelchair)?: Total Help needed standing up from a chair using your arms (e.g., wheelchair or bedside chair)?: A Lot Help needed to walk in hospital room?: Total Help needed climbing 3-5 steps with a railing? : Total 6 Click Score: 11    End of Session   Activity Tolerance: Patient limited by fatigue Patient left: in bed;with call bell/phone within reach;with bed alarm set;with SCD's reapplied Nurse Communication: Mobility status PT Visit Diagnosis: Other abnormalities of gait and mobility (R26.89);Muscle weakness (generalized) (M62.81)     Time: 1027-1050 PT Time Calculation (min) (ACUTE ONLY): 23 min  Charges:  $Therapeutic Exercise: 8-22 mins $Therapeutic Activity: 8-22 mins                     Bayard Males, PT Acute Rehabilitation Services Office: [phone]    Patrick Bishop 03/11/2022, 11:21 AM

## 2022-03-11 NOTE — Progress Notes (Addendum)
Hydrologist [facility])  Hospital Liaison: RN note       Notified by [facility] manager of patient/family request for [facility] Palliative services at home after discharge to independent living facility.            [place] Palliative team will follow up with patient after discharge.       Please call with any hospice or palliative related questions.       Thank you for this referral.     Clementeen Hoof, DNP, RN [place] (listed on AMION under Hosp[facility] and [place] [facility] [place]  [phone]

## 2022-03-11 NOTE — TOC Transition Note (Signed)
Transition of Care [facility]) - CM/SW Discharge Note   Patient Details  Name: Patrick Bishop MRN: [id number] Date of Birth: 06-22-1941  Transition of Care [facility]) CM/SW Contact:  Bethena Roys, RN Phone Number: 03/11/2022, 12:55 PM   Clinical Narrative: Case Manager received notification from the provider that the patient will need outpatient palliative services. Case Manager spoke with his significant other and is agreeable to outpatient palliative services. Referral submitted to [facility]. Office to call the patient with visit times. Awaiting confirmation from [facility]. Patient will need PTAR transport home.   Final next level of care: [place] Barriers to Discharge: Continued Medical Work up   Patient Goals and CMS Choice Patient states their goals for this hospitalization and ongoing recovery are:: to return to independent living facility.   Choice offered to / list presented to : Patient  Discharge Plan and Services In-house Referral: NA Discharge Planning Services: CM Consult Post Acute Care Choice: Home Health, Resumption of Svcs/PTA Provider          DME Arranged: Bedside commode DME Agency: [employer] Date DME Agency Contacted: 03/10/22 Time DME Agency Contacted: [phone] Representative spoke with at DME Agency: Frazier Butt HH Arranged: PT, OT, RN [place] Agency: [place] ([place]) Date [place]: 03/10/22 Time [place]: 1200 Representative spoke with at [place]: [place]  Readmission Risk Interventions    03/10/2022   12:19 PM  Readmission Risk Prevention Plan  Transportation Screening Complete  Medication Review Press photographer) Complete  PCP or Specialist appointment within 3-5 days of discharge Complete  HRI or [place] Complete  SW Recovery Care/Counseling Consult Complete  [place] Not Applicable

## 2022-03-11 NOTE — Discharge Summary (Signed)
Physician Discharge Summary  Patrick Bishop FFM:[id number] DOB: April 11, 1941 DOA: 03/08/2022  PCP: Lujean Amel, MD  Admit date: 03/08/2022 Discharge date: 03/11/2022  Admitted From: ILF Disposition:  ILF with home health   Recommendations for Outpatient Follow-up:  Follow up with PCP in 1 week Follow up with Dr. Alen Blew Recommend outpatient palliative care services  Discharge Condition: Stable CODE STATUS: DNR Diet recommendation: Heart healthy diet  Brief/Interim Summary: Patrick Bishop is a 81 y.o. 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  His girlfriend states he has had some shortness of breath over the last few few days, but got acutely worse this AM. He was gasping for air, but was able to talk in full sentences.  She states he has hardly eaten for weeks and has been in bed. She states he has lost over 100 pounds over the past 6 months. They deny any swelling in legs and state his legs have actually had improvement in the swelling. No coughing. He does endorse orthopnea. He does not eat much salt. No recent illness. He has not felt like he needed any extra demadex.  Back in April his cardiologist reduced his torsemide to once daily and only used second dose PRN, was '20mg'$  BID scheduled. Also weaned off of his metoprolol due to hypotension.    Chest x-ray revealed bilateral pleural effusion, moderately large left pleural effusion.  PCCM was consulted.  Patient underwent left-sided thoracentesis on 6/13.  CT chest revealed multiple pulmonary nodules and right-sided pleural effusion, small left pneumothorax.  Cytology revealed adenocarcinoma, consistent with metastatic carcinoma of urothelial origin.  Dr. Alen Blew was notified.  Patient underwent therapeutic  right-sided thoracentesis on 6/16.  He was weaned off oxygen and discharged to independent living facility with close outpatient follow-up.   Discharge Diagnoses:   Principal Problem:   Acute respiratory failure with hypoxia with large left pleural effusion  Active Problems:   Chronic diastolic CHF (congestive heart failure) (HCC)   Hypokalemia   Elevated troponin   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HCC)   Permanent atrial fibrillation (HCC); CHA2DS2-VASc score =5 (agex2, aortic plaque, CHF, HTN)   Anemia of chronic disease   Pulmonary hypertension (HCC)   Hyperlipidemia with target LDL less than 100   Ectatic abdominal aorta (HCC)   OSA on CPAP   Pleural effusion, left   Chronic venous stasis   Pulmonary nodules   Protein-calorie malnutrition, severe   Acute hypoxemic respiratory failure with bilateral pleural effusion -COVID-negative -Status post left thoracentesis 6/13, resulted 2100 cc of bloody fluid.  Exudative fluid per lights criteria.  Cytology consistent with metastatic urothelial carcinoma -Status post right thoracentesis 6/15, removed 950 cc clear yellow fluid -Pulse ox dropped to 85% on room air, required 4 L nasal cannula O2.  Now weaned to room air   Multiple pulmonary nodules and new thoracic adenopathy, consistent with metastatic urothelial cancer -Appreciate Dr. Alen Blew weighing in.  He will arrange for outpatient follow-up with staging scans as outpatient   Chronic diastolic heart failure -Patient was given Lasix in the emergency department -BNP 100.7 -Echocardiogram showed EF 55 to 57%, diastolic parameters indeterminant, normal pulmonary artery systolic pressure -Holding home Demadex due to hypotension   CKD stage IIIb -Baseline creatinine 2 -Stable   Demand ischemia -Likely in setting of  respiratory failure -Without any chest pain, troponin trend is flat 21-->21, not consistent with ACS  Permanent A-fib -CHA2DS2-VASc score 5 -Resume  Xarelto -Continue to monitor on telemetry   Chronic hypotension -Midodrine   Hyperlipidemia -Lipitor   OSA -CPAP nightly   GERD -PPI  Ectatic abdominal aorta -Follow-up outpatient   Asymptomatic bacteriuria -Urine culture shows 60,000 colonies of E. coli, ESBL.  Patient without signs or symptoms of dysuria, fevers, leukocytosis     Discharge Instructions  Discharge Instructions     (HEART FAILURE PATIENTS) Call MD:  Anytime you have any of the following symptoms: 1) 3 pound weight gain in 24 hours or 5 pounds in 1 week 2) shortness of breath, with or without a dry hacking cough 3) swelling in the hands, feet or stomach 4) if you have to sleep on extra pillows at night in order to breathe.   Complete by: As directed    Call MD for:  difficulty breathing, headache or visual disturbances   Complete by: As directed    Call MD for:  extreme fatigue   Complete by: As directed    Call MD for:  persistant dizziness or light-headedness   Complete by: As directed    Call MD for:  persistant nausea and vomiting   Complete by: As directed    Call MD for:  severe uncontrolled pain   Complete by: As directed    Call MD for:  temperature >100.4   Complete by: As directed    Diet - low sodium heart healthy   Complete by: As directed    Discharge instructions   Complete by: As directed    You were cared for by a hospitalist during your hospital stay. If you have any questions about your discharge medications or the care you received while you were in the hospital after you are discharged, you can call the unit and ask to speak with the hospitalist on call if the hospitalist that took care of you is not available. Once you are discharged, your primary care physician will handle any further medical issues. Please note that NO REFILLS for any discharge medications will be authorized once you are discharged, as it is imperative that you return to your primary care physician (or establish a  relationship with a primary care physician if you do not have one) for your aftercare needs so that they can reassess your need for medications and monitor your lab values.   Increase activity slowly   Complete by: As directed       Allergies as of 03/11/2022       Reactions   Aldactone [spironolactone] Other (See Comments)   Caused dizziness        Medication List     STOP taking these medications    acetaminophen 325 MG tablet Commonly known as: TYLENOL   potassium chloride SA 20 MEQ tablet Commonly known as: KLOR-CON M   prochlorperazine 10 MG tablet Commonly known as: COMPAZINE   torsemide 20 MG tablet Commonly known as: DEMADEX       TAKE these medications    atorvastatin 10 MG tablet Commonly known as: LIPITOR TAKE 1 TABLET DAILY   docusate sodium 100 MG capsule Commonly known as: COLACE Take 1 capsule (100 mg total) by mouth 2 (two) times daily.   feeding supplement Liqd Take 237 mLs by mouth 3 (three) times daily between meals.   HYDROcodone-acetaminophen 5-325 MG tablet Commonly known as: NORCO/VICODIN Take 1 tablet by mouth every  6 (six) hours as needed for moderate pain.   midodrine 5 MG tablet Commonly known as: PROAMATINE Take 1 tablet (5 mg total) by mouth 3 (three) times daily with meals. What changed:  medication strength how much to take when to take this   pantoprazole 40 MG tablet Commonly known as: Protonix Take 1 tablet (40 mg total) by mouth 2 (two) times daily.   tamsulosin 0.4 MG Caps capsule Commonly known as: FLOMAX Take 0.4 mg by mouth daily.   Xarelto 20 MG Tabs tablet Generic drug: rivaroxaban Take 1 tablet (20 mg total) by mouth daily with supper.               Durable Medical Equipment  (From admission, onward)           Start     Ordered   03/10/22 1224  For home use only DME Bedside commode  Once       Question:  Patient needs a bedside commode to treat with the following condition  Answer:   General weakness   03/10/22 1224            Follow-up Information     [facility] Follow up.   Why: Bedside commode to be delivered to the room Contact information: [place]  [address] [phone]         [facility] Follow up.   Why: Registered Nurse, Physical Therapy, Occupational Therapy-office to call with visit times        [name], [name], MD Follow up.   Specialty: Family Medicine Contact information: [place] [address] [phone]         [name], MD Follow up.   Specialty: Oncology Contact information: [place] [address] [phone]                Allergies  Allergen Reactions   Aldactone [Spironolactone] Other (See Comments)    Caused dizziness    Consultations: PCCM Oncology    Procedures/Studies: DG CHEST PORT 1 VIEW  Result Date: 03/10/2022 CLINICAL DATA:  Right thoracentesis EXAM: PORTABLE CHEST 1 VIEW COMPARISON:  Chest x-ray dated April 08, 2022 FINDINGS: Cardiac and mediastinal contours are unchanged. Interval decreased size of small right pleural effusion. Stable trace left pleural effusion. No evidence of right pneumothorax. No visible pneumothorax. IMPRESSION: No evidence of pneumothorax status post thoracentesis. Electronically Signed   By: Yetta Glassman M.D.   On: 03/10/2022 15:57   ECHOCARDIOGRAM COMPLETE  Result Date: 03/09/2022    ECHOCARDIOGRAM REPORT   Patient Name:   Patrick Bishop Date of Exam: 03/09/2022 Medical Rec #:  [id number]       Height:       73.0 in Accession #:    [id number]      Weight:       222.0 lb Date of Birth:  Feb 07, 1941       BSA:          2.249 m Patient Age:    51 years        BP:           94/62 mmHg Patient Gender: M               HR:           86 bpm. Exam Location:  Inpatient Procedure: 2D Echo, Cardiac Doppler, Color Doppler and Intracardiac            Opacification  Agent  Indications:    CHF-Acute Diastolic E33.29  History:        Patient has prior history of Echocardiogram examinations, most                 recent 08/10/2021. Pulmonary HTN; Risk Factors:Hypertension and                 Dyslipidemia. Chronic kidney disease. AAA.  Sonographer:    Darlina Sicilian RDCS Referring Phys: [id number] [facility]  Sonographer Comments: Technically difficult study due to poor echo windows. IMPRESSIONS  1. Very poor acoustic windows. Even with Definity use, difficult.     OVerall LVEF appears normal (approximately 55 to 60%). Left ventricular diastolic parameters are indeterminate.  2. Right ventricular systolic function is normal. The right ventricular size is normal. There is normal pulmonary artery systolic pressure.  3. The mitral valve is normal in structure. Trivial mitral valve regurgitation.  4. The aortic valve is tricuspid. Aortic valve regurgitation is not visualized. FINDINGS  Left Ventricle: Very poor acoustic windows. Even with Definity use, difficult. OVerall LVEF appears normal (approximately 55 to 60%). Definity contrast agent was given IV to delineate the left ventricular endocardial borders. The left ventricular internal cavity size was normal in size. Suboptimal image quality limits for assessment of left ventricular hypertrophy. Left ventricular diastolic parameters are indeterminate. Right Ventricle: The right ventricular size is normal. Right vetricular wall thickness was not assessed. Right ventricular systolic function is normal. There is normal pulmonary artery systolic pressure. The tricuspid regurgitant velocity is 2.20 m/s, and with an assumed right atrial pressure of 8 mmHg, the estimated right ventricular systolic pressure is 60.6 mmHg. Left Atrium: Left atrial size was normal in size. Right Atrium: Right atrial size was normal in size. Pericardium: There is no evidence of pericardial effusion. Mitral Valve: The mitral valve is normal in structure. Trivial mitral  valve regurgitation. Tricuspid Valve: The tricuspid valve is normal in structure. Tricuspid valve regurgitation is trivial. Aortic Valve: The aortic valve is tricuspid. Aortic valve regurgitation is not visualized. Pulmonic Valve: The pulmonic valve was not well visualized. Pulmonic valve regurgitation is not visualized. Aorta: The aortic root is normal in size and structure. IAS/Shunts: No atrial level shunt detected by color flow Doppler.  RIGHT VENTRICLE RV S prime:     11.30 cm/s TAPSE (M-mode): 1.2 cm LEFT ATRIUM            Index        RIGHT ATRIUM           Index LA Vol (A4C): 100.0 ml 44.46 ml/m  RA Area:     16.00 cm                                     RA Volume:   34.50 ml  15.34 ml/m  AORTIC VALVE LVOT Vmax:   57.10 cm/s LVOT Vmean:  42.400 cm/s LVOT VTI:    0.092 m MITRAL VALVE               TRICUSPID VALVE MV Area (PHT): 4.49 cm    TR Peak grad:   19.4 mmHg MV Decel Time: 169 msec    TR Vmax:        220.00 cm/s MV E velocity: 75.73 cm/s  SHUNTS                            Systemic VTI: 0.09 m Dorris Carnes MD Electronically signed by Dorris Carnes MD Signature Date/Time: 03/09/2022/1:53:14 PM    Final    DG Chest Port 1 View  Result Date: 03/09/2022 CLINICAL DATA:  Encounter for hemothorax. EXAM: PORTABLE CHEST 1 VIEW COMPARISON:  Chest CT yesterday at 5:01 p.m., portable chest yesterday at 6:15 p.m. FINDINGS: 4:58 a.m., 03/09/2022. The cardiac size is normal. There is a stable mediastinal configuration. There is aortic atherosclerosis. Small left and moderate right pleural effusions are again noted with retrocardiac left lower lobe atelectasis or consolidation and hazy interstitial change overlying the right pleural effusion. There is a trace left apicolateral pneumothorax similar to the previous study. No right pneumothorax. Remainder of the lungs are clear. Right upper lobe pulmonary nodules noted on CT are not visible radiographically. There is a left lower lobe nodule  which is also not visible radiographically. No acute osseous abnormality is seen. There is advanced right glenohumeral DJD. IMPRESSION: Moderate-sized right and small left pleural effusions and overlying lung opacities. Stable overall aeration. Trace left apicolateral pneumothorax. Pulmonary nodules are not visible on x-ray. Electronically Signed   By: Telford Nab M.D.   On: 03/09/2022 06:11   DG Chest Port 1 View  Result Date: 03/08/2022 CLINICAL DATA:  Shortness of breath EXAM: PORTABLE CHEST 1 VIEW COMPARISON:  Previous studies including CT and chest radiograph done earlier today FINDINGS: Transverse diameter of heart is slightly increased. There is significant interval decrease in left pleural effusion. Small left apical pneumothorax seen in the CT is difficult to visualize. Small right pleural effusion is seen. Increased markings are seen in the medial aspect of both lower lung fields and right parahilar region. Degenerative changes are noted in the right shoulder and both AC joints. IMPRESSION: There is interval decrease in left pleural effusion. Increased markings in the right parahilar region and both lower lung fields may suggest atelectasis/pneumonia. Electronically Signed   By: Elmer Picker M.D.   On: 03/08/2022 18:35   CT CHEST WO CONTRAST  Result Date: 03/08/2022 CLINICAL DATA:  Pneumonia. Complication suspected * Tracking Code: BO * EXAM: CT CHEST WITHOUT CONTRAST TECHNIQUE: Multidetector CT imaging of the chest was performed following the standard protocol without IV contrast. RADIATION DOSE REDUCTION: This exam was performed according to the departmental dose-optimization program which includes automated exposure control, adjustment of the mA and/or kV according to patient size and/or use of iterative reconstruction technique. COMPARISON:  Plain film of earlier today.  Chest CT 08/05/2021. FINDINGS: Cardiovascular: Aortic atherosclerosis. Tortuous thoracic aorta. Mild cardiomegaly.  Left main and 3 vessel coronary artery calcification. Pulmonary artery enlargement, outflow tract 3.4 cm Mediastinum/Nodes: Extensive thoracic adenopathy. Right paratracheal 2.1 cm node on 50/3 is new. Hilar regions poorly evaluated without intravenous contrast. Lungs/Pleura: Moderate right and small left pleural effusions, new since the prior CT. Trace left-sided pneumothorax, including at the apex on 23/5 and within the anterior inferior left chest on 101/5. Right upper lobe pulmonary nodules x2 including at 1.2 and 1.3 cm on 67/5. At the site of a 4 mm nodule on 08/05/2021. Mild to moderate right hemidiaphragm elevation. Bibasilar compressive atelectasis. More cephalad 7 mm right upper lobe pulmonary nodule on 48/5, new. Anteromedial right upper lobe pleural-based 9 mm pulmonary nodule on 42/5, new. Suspect a left lower lobe pulmonary nodule of 1.7 cm on 107/5. This  is surrounded by atelectasis. Upper Abdomen: Subtle dependent gallstone. Normal noncontrast appearance of the imaged liver, spleen, stomach, pancreas, adrenal glands. Bilateral renal atrophy Musculoskeletal: left chest wall lipoma is incompletely imaged including at 17.4 cm on 48/3. 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 IMPRESSION: 1. Constellation of findings, including new and enlarged pulmonary nodules, new thoracic adenopathy which are highly suspicious for neoplasm, either metastatic disease or 1 or more primary bronchogenic carcinomas with nodal metastasis. 2. New right larger than left pleural effusions with compressive atelectasis. 3. Trace left-sided pneumothorax. 4. Coronary artery atherosclerosis. Aortic Atherosclerosis (ICD10-I70.0). 5. Bilateral renal atrophy 6. Cholelithiasis 7. Pulmonary artery enlargement suggests pulmonary arterial hypertension. Electronically Signed   By: Abigail Miyamoto M.D.   On: 03/08/2022 17:35   DG Chest Portable 1 View  Result Date: 03/08/2022 CLINICAL DATA:  SOB EXAM: PORTABLE CHEST 1 VIEW COMPARISON:  January 03, 2022  FINDINGS: There is moderately large left pleural effusion seen and is new. Likely left basilar atelectasis. Elevation of the right hemidiaphragm. Small right pleural effusion. Minor right basilar atelectasis. Bony thorax is unremarkable. IMPRESSION: Moderately large left pleural effusion and is new. Small right pleural effusion. Bibasilar atelectasis. Electronically Signed   By: Frazier Richards M.D.   On: 03/08/2022 12:20       Discharge Exam: Vitals:   03/11/22 0823 03/11/22 1114  BP: (!) 84/60 (!) 107/58  Pulse: 95   Resp: (!) 21   Temp: 98 F (36.7 C)   SpO2: 99%     General: Pt is alert, awake, not in acute distress Cardiovascular: irreg rhythm, S1/S2 +, no edema Respiratory: CTA bilaterally anteriorly without wheezing or rhonchi.  No respiratory distress or conversational dyspnea. Abdominal: Soft, NT, ND, bowel sounds + Extremities: no edema, no cyanosis Psych: Normal mood and affect, stable judgement and insight     The results of significant diagnostics from this hospitalization (including imaging, microbiology, ancillary and laboratory) are listed below for reference.     Microbiology: Recent Results (from the past 240 hour(s))  SARS Coronavirus 2 by RT PCR (hospital order, performed in [facility] hospital lab) *cepheid single result test* Anterior Nasal Swab     Status: None   Collection Time: 03/08/22  3:19 PM   Specimen: Anterior Nasal Swab  Result Value Ref Range Status   SARS Coronavirus 2 by RT PCR NEGATIVE NEGATIVE Final    Comment: (NOTE) SARS-CoV-2 target nucleic acids are NOT DETECTED.  The SARS-CoV-2 RNA is generally detectable in upper and lower respiratory specimens during the acute phase of infection. The lowest concentration of SARS-CoV-2 viral copies this assay can detect is 250 copies / mL. A negative result does not preclude SARS-CoV-2 infection and should not be used as the sole basis for treatment or other patient management decisions.  A negative  result may occur with improper specimen collection / handling, submission of specimen other than nasopharyngeal swab, presence of viral mutation(s) within the areas targeted by this assay, and inadequate number of viral copies (<250 copies / mL). A negative result must be combined with clinical observations, patient history, and epidemiological information.  Fact Sheet for Patients:   https://www.patel.info/  Fact Sheet for Healthcare Providers: https://hall.com/  This test is not yet approved or  cleared by the [place] FDA and has been authorized for detection and/or diagnosis of SARS-CoV-2 by FDA under an Emergency Use Authorization (EUA).  This EUA will remain in effect (meaning this test can be used) for the duration of the COVID-19 declaration under Section 564(b)(1) of  the Act, 21 U.S.C. section 360bbb-3(b)(1), unless the authorization is terminated or revoked sooner.  Performed at [facility], [place] [address] [place], [place] [address]   Body fluid culture w Gram Stain     Status: None   Collection Time: 03/08/22  4:30 PM   Specimen: Pleural Fluid  Result Value Ref Range Status   Specimen Description PLEURAL  Final   Special Requests NONE  Final   Gram Stain   Final    FEW WBC PRESENT, PREDOMINANTLY PMN NO ORGANISMS SEEN    Culture   Final    NO GROWTH 3 DAYS Performed at [facility], [place] [address], [place], [place] [address]    Report Status 03/11/2022 FINAL  Final  Urine Culture     Status: Abnormal   Collection Time: 03/08/22 10:28 PM   Specimen: Urine, Clean Catch  Result Value Ref Range Status   Specimen Description URINE, CLEAN CATCH  Final   Special Requests NONE  Final   Culture (A)  Final    60,000 COLONIES/mL ESCHERICHIA COLI Confirmed Extended Spectrum Beta-Lactamase Producer (ESBL).  In bloodstream infections from ESBL organisms, carbapenems are preferred over piperacillin/tazobactam. They  are shown to have a lower risk of mortality. WITHIN MIXED ORGANISMS Performed at [facility], [place] [address], [place], [place] [address]    Report Status 03/10/2022 FINAL  Final   Organism ID, Bacteria ESCHERICHIA COLI (A)  Final      Susceptibility   Escherichia coli - MIC*    AMPICILLIN >=32 RESISTANT Resistant     CEFAZOLIN >=64 RESISTANT Resistant     CEFEPIME 16 RESISTANT Resistant     CEFTRIAXONE >=64 RESISTANT Resistant     CIPROFLOXACIN >=4 RESISTANT Resistant     GENTAMICIN >=16 RESISTANT Resistant     IMIPENEM <=0.25 SENSITIVE Sensitive     NITROFURANTOIN 32 SENSITIVE Sensitive     TRIMETH/SULFA >=320 RESISTANT Resistant     AMPICILLIN/SULBACTAM >=32 RESISTANT Resistant     PIP/TAZO 16 SENSITIVE Sensitive     * 60,000 COLONIES/mL ESCHERICHIA COLI     Labs: BNP (last 3 results) Recent Labs    08/09/21 0924 09/16/21 2035 03/08/22 1226  BNP 217.3* 83.2 852.7*   Basic Metabolic Panel: Recent Labs  Lab 03/08/22 1226 03/08/22 1447 03/08/22 1745 03/09/22 0426 03/10/22 0220 03/11/22 0154  NA 143  --  144 141 140 141  K 3.3*  --  3.2* 3.1* 3.9 4.5  CL 101  --   --  100 101 102  CO2 32  --   --  32 29 31  GLUCOSE 146*  --   --  134* 129* 112*  BUN 21  --   --  24* 25* 23  CREATININE 1.79*  --   --  1.73* 1.53* 1.47*  CALCIUM 8.7*  --   --  8.3* 8.4* 8.7*  MG  --  2.1  --   --  2.0  --    Liver Function Tests: Recent Labs  Lab 03/08/22 1226  AST 13*  ALT 9  ALKPHOS 76  BILITOT 1.4*  PROT 6.1*  ALBUMIN 2.7*   No results for input(s): "LIPASE", "AMYLASE" in the last 168 hours. No results for input(s): "AMMONIA" in the last 168 hours. CBC: Recent Labs  Lab 03/08/22 1226 03/08/22 1745 03/09/22 0426  WBC 6.5  --  7.2  NEUTROABS 5.5  --   --   HGB 11.0* 10.9* 11.6*  HCT 34.8* 32.0*  36.1*  MCV 97.2  --  96.8  PLT 289  --  257   Cardiac Enzymes: No results for input(s): "CKTOTAL", "CKMB", "CKMBINDEX", "TROPONINI" in the last 168  hours. BNP: Invalid input(s): "POCBNP" CBG: No results for input(s): "GLUCAP" in the last 168 hours. D-Dimer No results for input(s): "DDIMER" in the last 72 hours. Hgb A1c No results for input(s): "HGBA1C" in the last 72 hours. Lipid Profile No results for input(s): "CHOL", "HDL", "LDLCALC", "TRIG", "CHOLHDL", "LDLDIRECT" in the last 72 hours. Thyroid function studies Recent Labs    03/08/22 1732  TSH 0.913   Anemia work up No results for input(s): "VITAMINB12", "FOLATE", "FERRITIN", "TIBC", "IRON", "RETICCTPCT" in the last 72 hours. Urinalysis    Component Value Date/Time   COLORURINE AMBER (A) 03/08/2022 2228   APPEARANCEUR CLOUDY (A) 03/08/2022 2228   LABSPEC 1.013 03/08/2022 2228   PHURINE 5.0 03/08/2022 2228   GLUCOSEU NEGATIVE 03/08/2022 2228   HGBUR MODERATE (A) 03/08/2022 2228   BILIRUBINUR NEGATIVE 03/08/2022 2228   KETONESUR NEGATIVE 03/08/2022 2228   PROTEINUR 100 (A) 03/08/2022 2228   NITRITE NEGATIVE 03/08/2022 2228   LEUKOCYTESUR LARGE (A) 03/08/2022 2228   Sepsis Labs Recent Labs  Lab 03/08/22 1226 03/09/22 0426  WBC 6.5 7.2   Microbiology Recent Results (from the past 240 hour(s))  SARS Coronavirus 2 by RT PCR (hospital order, performed in [place] [facility] hospital lab) *cepheid single result test* Anterior Nasal Swab     Status: None   Collection Time: 03/08/22  3:19 PM   Specimen: Anterior Nasal Swab  Result Value Ref Range Status   SARS Coronavirus 2 by RT PCR NEGATIVE NEGATIVE Final    Comment: (NOTE) SARS-CoV-2 target nucleic acids are NOT DETECTED.  The SARS-CoV-2 RNA is generally detectable in upper and lower respiratory specimens during the acute phase of infection. The lowest concentration of SARS-CoV-2 viral copies this assay can detect is 250 copies / mL. A negative result does not preclude SARS-CoV-2 infection and should not be used as the sole basis for treatment or other patient management decisions.  A negative result may occur  with improper specimen collection / handling, submission of specimen other than nasopharyngeal swab, presence of viral mutation(s) within the areas targeted by this assay, and inadequate number of viral copies (<250 copies / mL). A negative result must be combined with clinical observations, patient history, and epidemiological information.  Fact Sheet for Patients:   https://www.patel.info/  Fact Sheet for Healthcare Providers: https://hall.com/  This test is not yet approved or  cleared by the [place] FDA and has been authorized for detection and/or diagnosis of SARS-CoV-2 by FDA under an Emergency Use Authorization (EUA).  This EUA will remain in effect (meaning this test can be used) for the duration of the COVID-19 declaration under Section 564(b)(1) of the Act, 21 U.S.C. section 360bbb-3(b)(1), unless the authorization is terminated or revoked sooner.  Performed at [facility], [place] [address], [place], [place] [address]   Body fluid culture w Gram Stain     Status: None   Collection Time: 03/08/22  4:30 PM   Specimen: Pleural Fluid  Result Value Ref Range Status   Specimen Description PLEURAL  Final   Special Requests NONE  Final   Gram Stain   Final    FEW WBC PRESENT, PREDOMINANTLY PMN NO ORGANISMS SEEN    Culture   Final    NO GROWTH 3 DAYS Performed at [facility], [place],  [place] [address]    Report Status 03/11/2022 FINAL  Final  Urine Culture     Status: Abnormal   Collection Time: 03/08/22 10:28 PM   Specimen: Urine, Clean Catch  Result Value Ref Range Status   Specimen Description URINE, CLEAN CATCH  Final   Special Requests NONE  Final   Culture (A)  Final    60,000 COLONIES/mL ESCHERICHIA COLI Confirmed Extended Spectrum Beta-Lactamase Producer (ESBL).  In bloodstream infections from ESBL organisms, carbapenems are preferred over piperacillin/tazobactam. They are shown to  have a lower risk of mortality. WITHIN MIXED ORGANISMS Performed at [facility], [place] [address], [place], [place] [address]    Report Status 03/10/2022 FINAL  Final   Organism ID, Bacteria ESCHERICHIA COLI (A)  Final      Susceptibility   Escherichia coli - MIC*    AMPICILLIN >=32 RESISTANT Resistant     CEFAZOLIN >=64 RESISTANT Resistant     CEFEPIME 16 RESISTANT Resistant     CEFTRIAXONE >=64 RESISTANT Resistant     CIPROFLOXACIN >=4 RESISTANT Resistant     GENTAMICIN >=16 RESISTANT Resistant     IMIPENEM <=0.25 SENSITIVE Sensitive     NITROFURANTOIN 32 SENSITIVE Sensitive     TRIMETH/SULFA >=320 RESISTANT Resistant     AMPICILLIN/SULBACTAM >=32 RESISTANT Resistant     PIP/TAZO 16 SENSITIVE Sensitive     * 60,000 COLONIES/mL ESCHERICHIA COLI     Patient was seen and examined on the day of discharge and was found to be in stable condition. Time coordinating discharge: 35 minutes including assessment and coordination of care, as well as examination of the patient.   SIGNED:  Dessa Phi, DO [facility] 03/11/2022, 12:02 PM

## 2022-03-13 DIAGNOSIS — I272 Pulmonary hypertension, unspecified: Secondary | ICD-10-CM | POA: Diagnosis not present

## 2022-03-13 DIAGNOSIS — I5032 Chronic diastolic (congestive) heart failure: Secondary | ICD-10-CM | POA: Diagnosis not present

## 2022-03-13 DIAGNOSIS — J9611 Chronic respiratory failure with hypoxia: Secondary | ICD-10-CM | POA: Diagnosis not present

## 2022-03-13 DIAGNOSIS — I130 Hypertensive heart and chronic kidney disease with heart failure and stage 1 through stage 4 chronic kidney disease, or unspecified chronic kidney disease: Secondary | ICD-10-CM | POA: Diagnosis not present

## 2022-03-13 DIAGNOSIS — C670 Malignant neoplasm of trigone of bladder: Secondary | ICD-10-CM | POA: Diagnosis not present

## 2022-03-13 DIAGNOSIS — N183 Chronic kidney disease, stage 3 unspecified: Secondary | ICD-10-CM | POA: Diagnosis not present

## 2022-03-15 DIAGNOSIS — N183 Chronic kidney disease, stage 3 unspecified: Secondary | ICD-10-CM | POA: Diagnosis not present

## 2022-03-15 DIAGNOSIS — C670 Malignant neoplasm of trigone of bladder: Secondary | ICD-10-CM | POA: Diagnosis not present

## 2022-03-15 DIAGNOSIS — J9611 Chronic respiratory failure with hypoxia: Secondary | ICD-10-CM | POA: Diagnosis not present

## 2022-03-15 DIAGNOSIS — I272 Pulmonary hypertension, unspecified: Secondary | ICD-10-CM | POA: Diagnosis not present

## 2022-03-15 DIAGNOSIS — I5032 Chronic diastolic (congestive) heart failure: Secondary | ICD-10-CM | POA: Diagnosis not present

## 2022-03-15 DIAGNOSIS — I130 Hypertensive heart and chronic kidney disease with heart failure and stage 1 through stage 4 chronic kidney disease, or unspecified chronic kidney disease: Secondary | ICD-10-CM | POA: Diagnosis not present

## 2022-03-16 ENCOUNTER — Inpatient Hospital Stay: Payer: Medicare Other | Admitting: Oncology

## 2022-03-17 DIAGNOSIS — I272 Pulmonary hypertension, unspecified: Secondary | ICD-10-CM | POA: Diagnosis not present

## 2022-03-17 DIAGNOSIS — C670 Malignant neoplasm of trigone of bladder: Secondary | ICD-10-CM | POA: Diagnosis not present

## 2022-03-17 DIAGNOSIS — N183 Chronic kidney disease, stage 3 unspecified: Secondary | ICD-10-CM | POA: Diagnosis not present

## 2022-03-17 DIAGNOSIS — I130 Hypertensive heart and chronic kidney disease with heart failure and stage 1 through stage 4 chronic kidney disease, or unspecified chronic kidney disease: Secondary | ICD-10-CM | POA: Diagnosis not present

## 2022-03-17 DIAGNOSIS — I5032 Chronic diastolic (congestive) heart failure: Secondary | ICD-10-CM | POA: Diagnosis not present

## 2022-03-17 DIAGNOSIS — J9611 Chronic respiratory failure with hypoxia: Secondary | ICD-10-CM | POA: Diagnosis not present

## 2022-03-18 DIAGNOSIS — I272 Pulmonary hypertension, unspecified: Secondary | ICD-10-CM | POA: Diagnosis not present

## 2022-03-18 DIAGNOSIS — C670 Malignant neoplasm of trigone of bladder: Secondary | ICD-10-CM | POA: Diagnosis not present

## 2022-03-18 DIAGNOSIS — J9611 Chronic respiratory failure with hypoxia: Secondary | ICD-10-CM | POA: Diagnosis not present

## 2022-03-18 DIAGNOSIS — J9601 Acute respiratory failure with hypoxia: Secondary | ICD-10-CM | POA: Diagnosis not present

## 2022-03-18 DIAGNOSIS — J90 Pleural effusion, not elsewhere classified: Secondary | ICD-10-CM | POA: Diagnosis not present

## 2022-03-18 DIAGNOSIS — C7910 Secondary malignant neoplasm of unspecified urinary organs: Secondary | ICD-10-CM | POA: Diagnosis not present

## 2022-03-18 DIAGNOSIS — I5032 Chronic diastolic (congestive) heart failure: Secondary | ICD-10-CM | POA: Diagnosis not present

## 2022-03-18 DIAGNOSIS — N183 Chronic kidney disease, stage 3 unspecified: Secondary | ICD-10-CM | POA: Diagnosis not present

## 2022-03-18 DIAGNOSIS — I130 Hypertensive heart and chronic kidney disease with heart failure and stage 1 through stage 4 chronic kidney disease, or unspecified chronic kidney disease: Secondary | ICD-10-CM | POA: Diagnosis not present

## 2022-03-18 DIAGNOSIS — R197 Diarrhea, unspecified: Secondary | ICD-10-CM | POA: Diagnosis not present

## 2022-03-18 DIAGNOSIS — I959 Hypotension, unspecified: Secondary | ICD-10-CM | POA: Diagnosis not present

## 2022-03-20 IMAGING — CR DG KNEE COMPLETE 4+V*R*
4 series · 4 of 4 positions shown · non-contrast
Comparison: None.

CLINICAL DATA: Bilateral knee pain

EXAM:
RIGHT KNEE - COMPLETE 4+ VIEW

[t knee ap right]
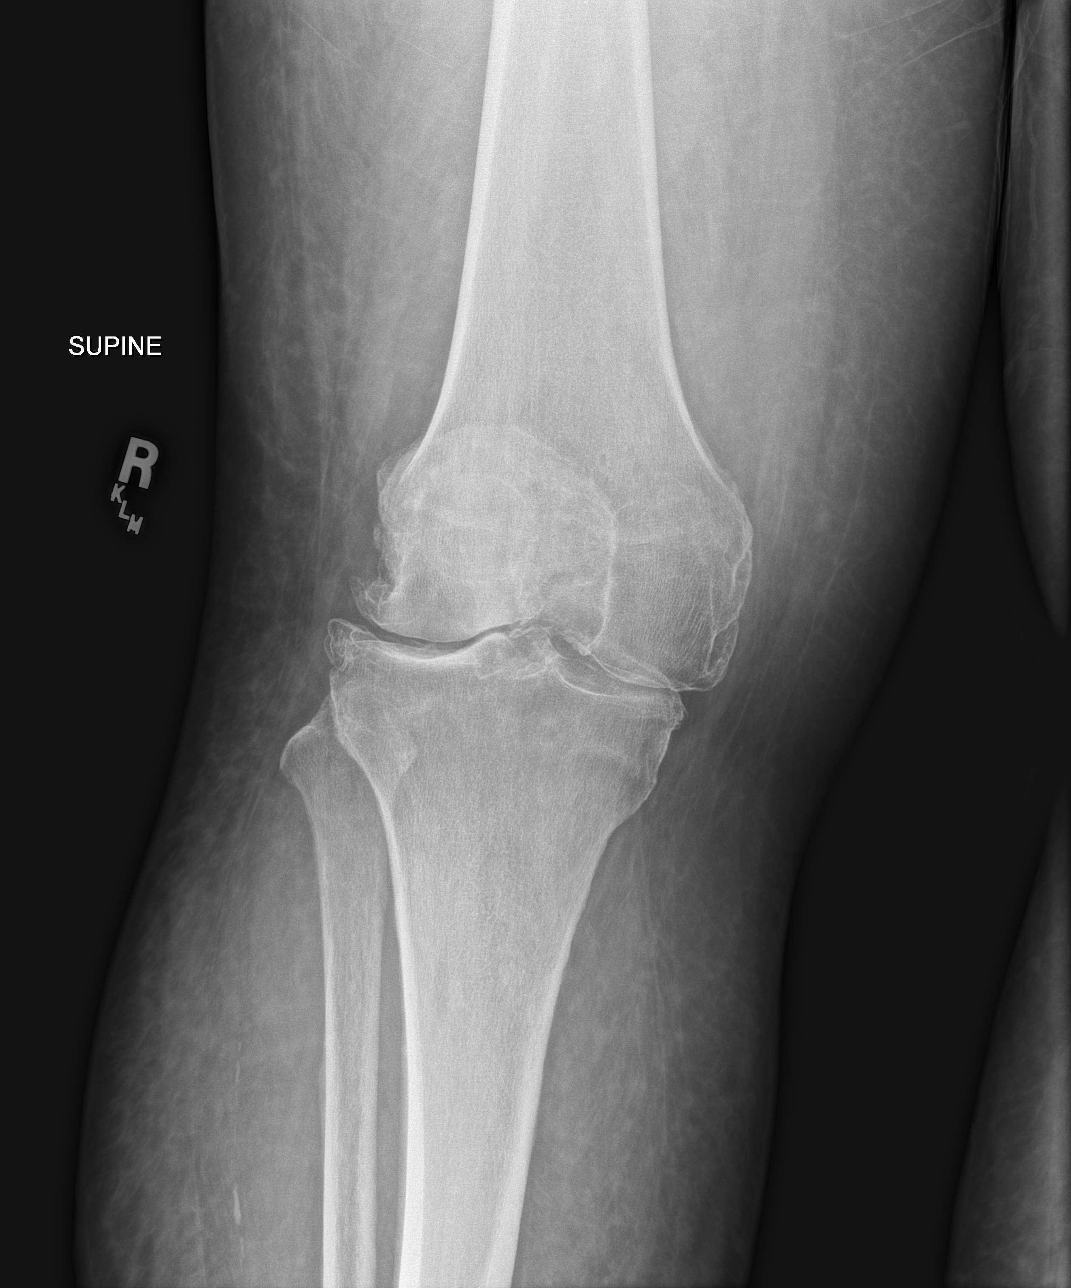

[t knee obl right (1 of 2)]
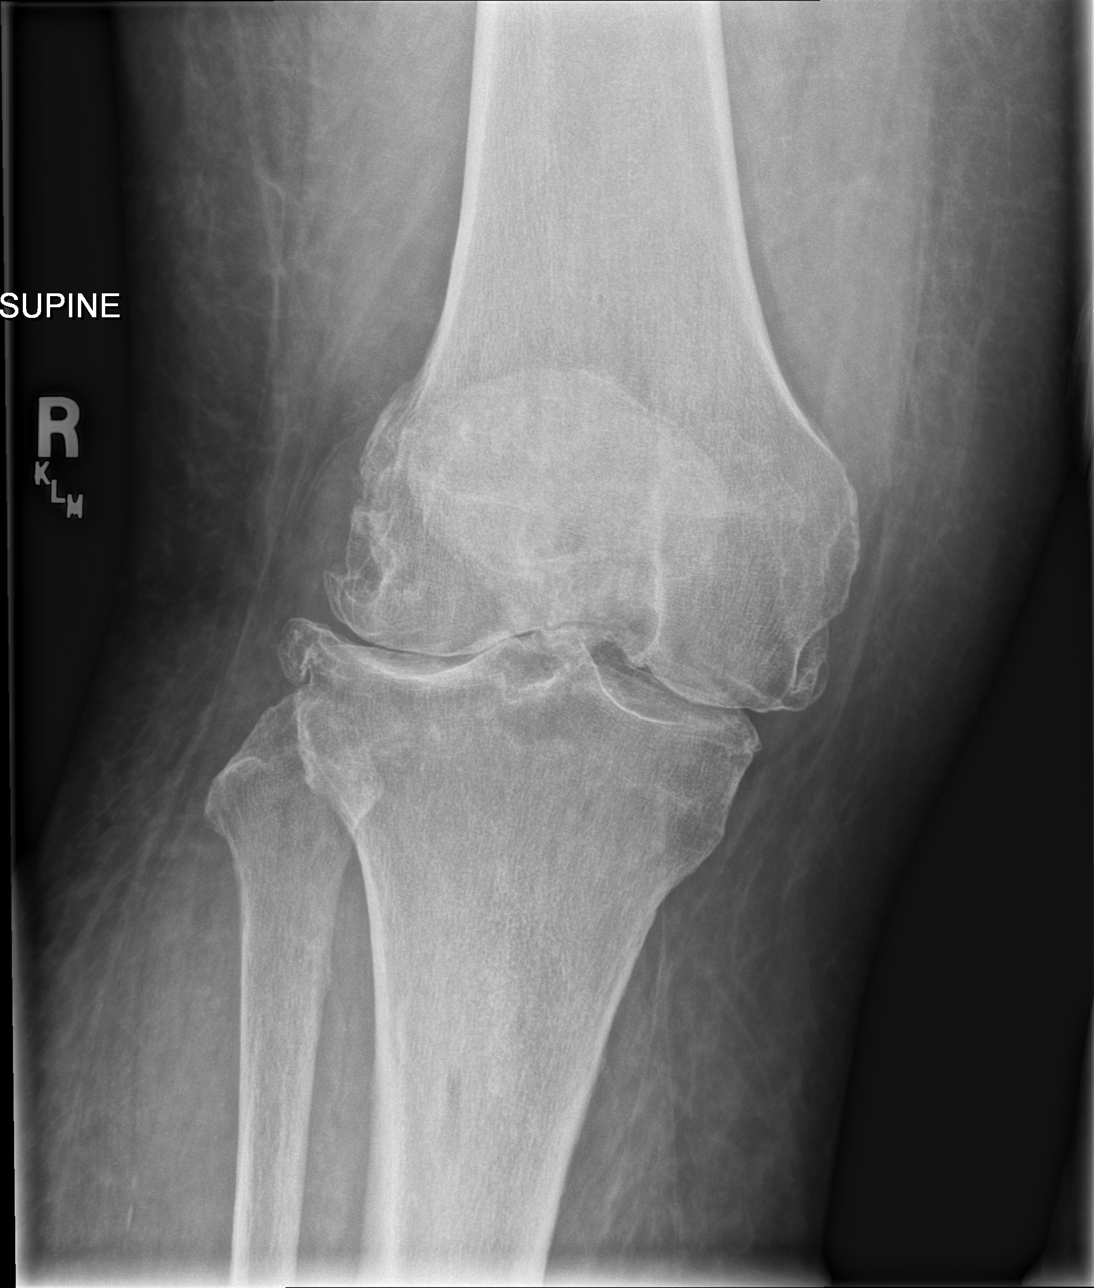

[t knee obl right (2 of 2)]
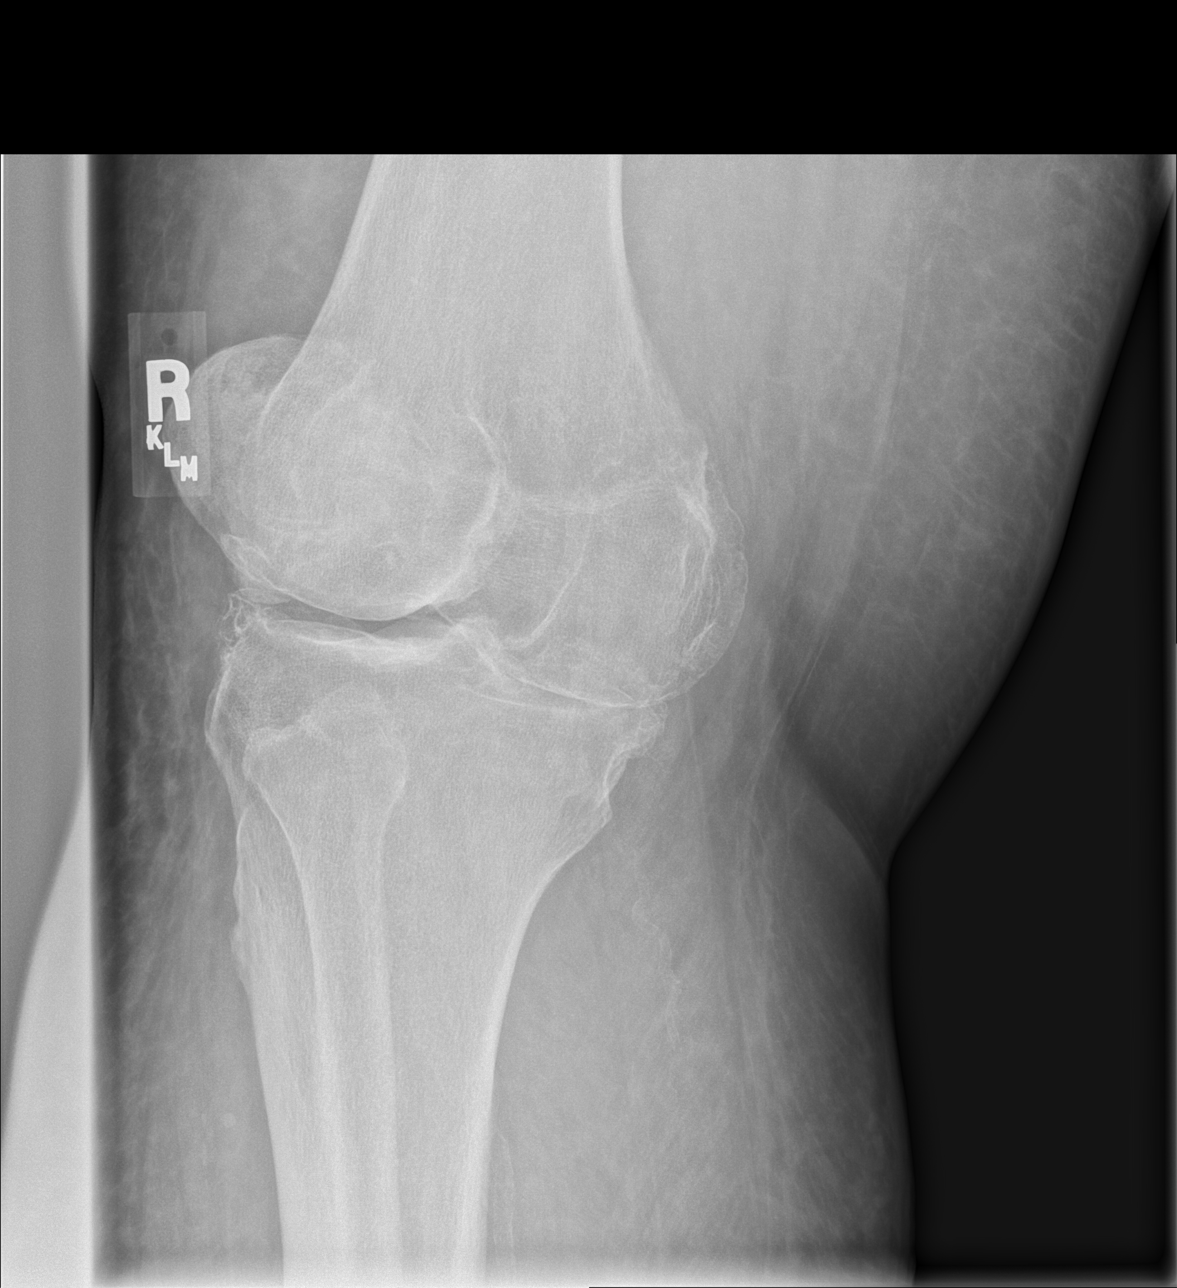

[w knee lat right]
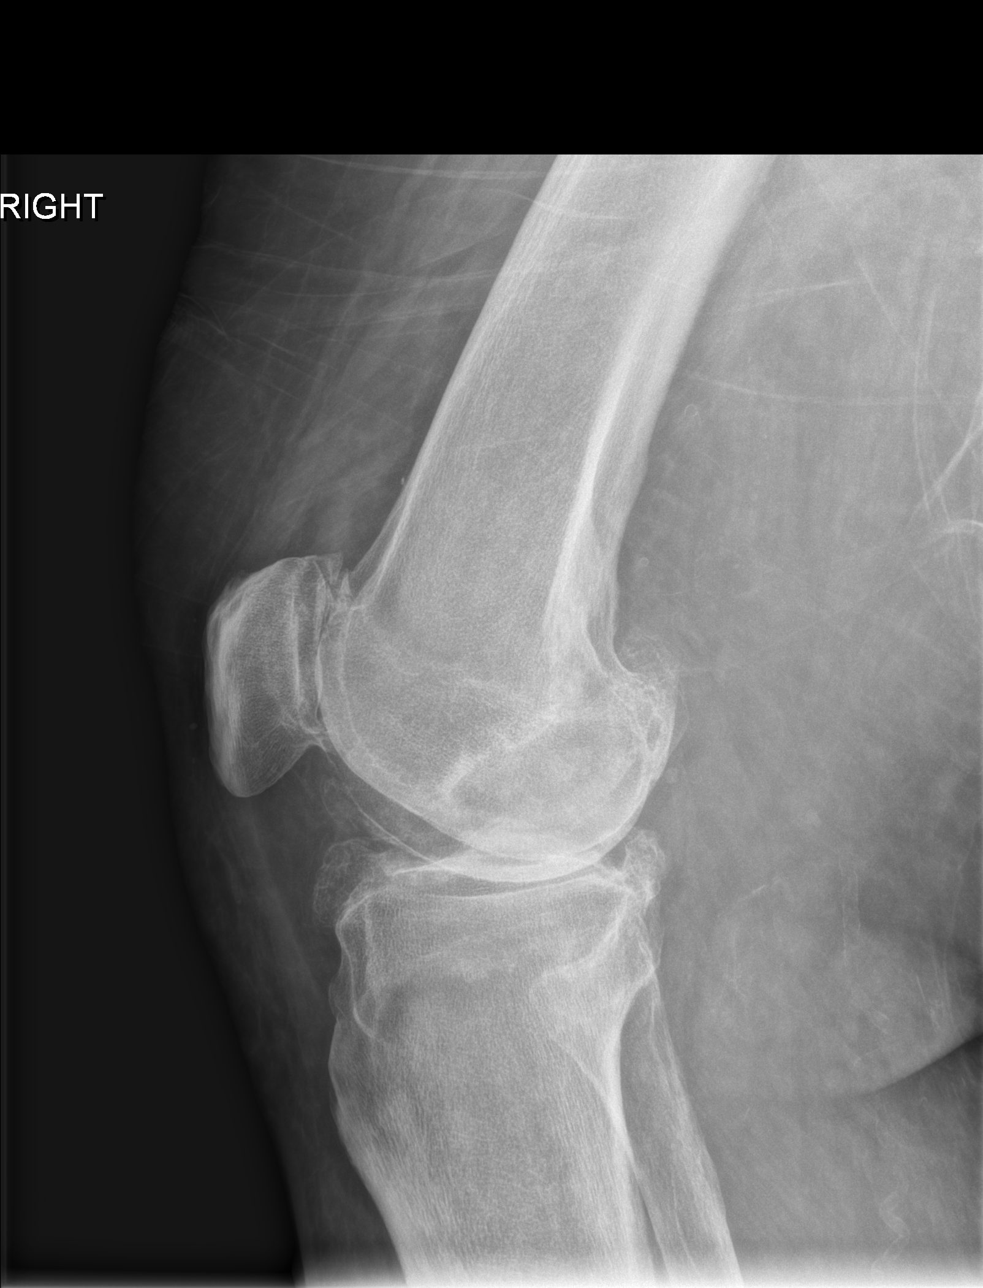

[4 of 4 positions shown; findings below may reference images not displayed]

FINDINGS: No fracture or malalignment. Moderate severe tricompartment
arthritis with joint space narrowing and bulky osteophytes. Trace
knee effusion. Generalized edema
IMPRESSION: Moderate severe tricompartment arthritis of the knee

## 2022-03-20 IMAGING — CR DG KNEE COMPLETE 4+V*L*
4 series · 4 of 4 positions shown · non-contrast
Comparison: None.

CLINICAL DATA: Hip and knee pain

EXAM:
LEFT KNEE - COMPLETE 4+ VIEW

[t knee ap left (1 of 2)]
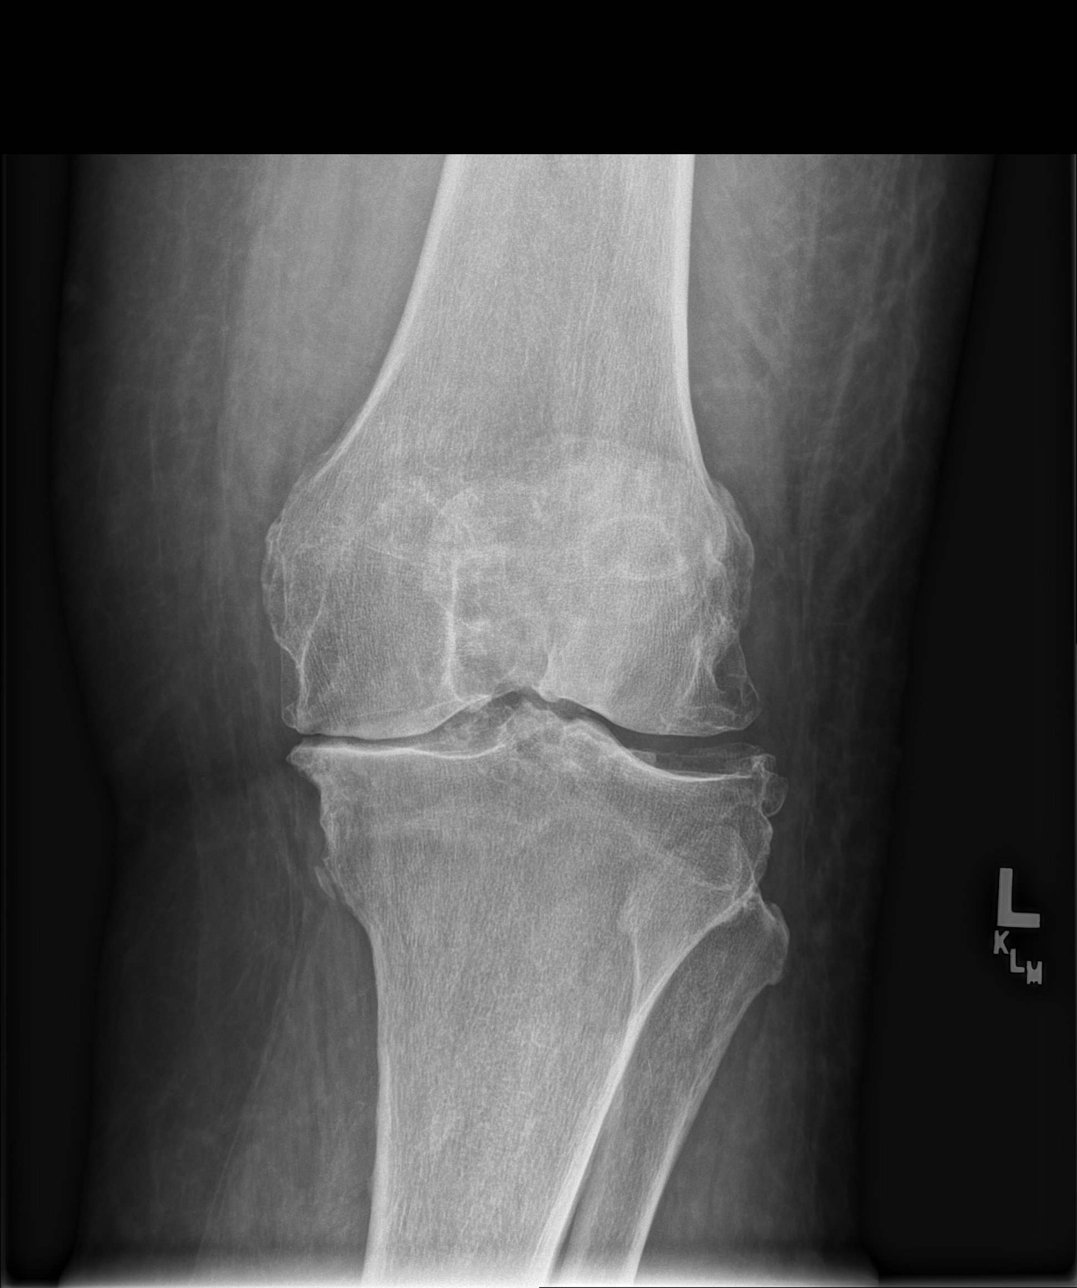

[t knee ap left (2 of 2)]
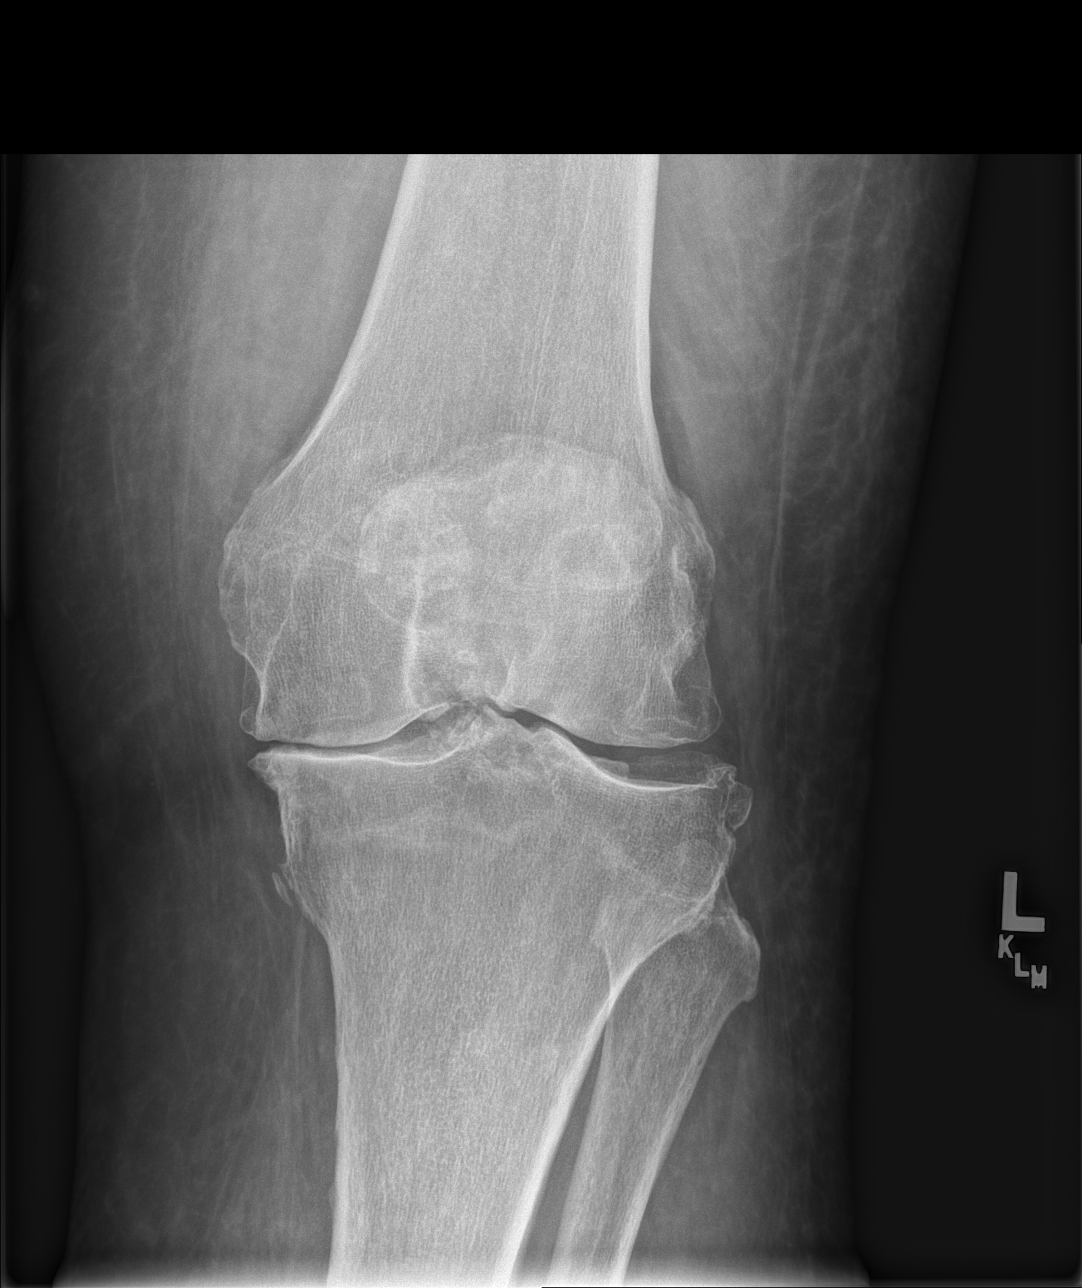

[t knee obl left]
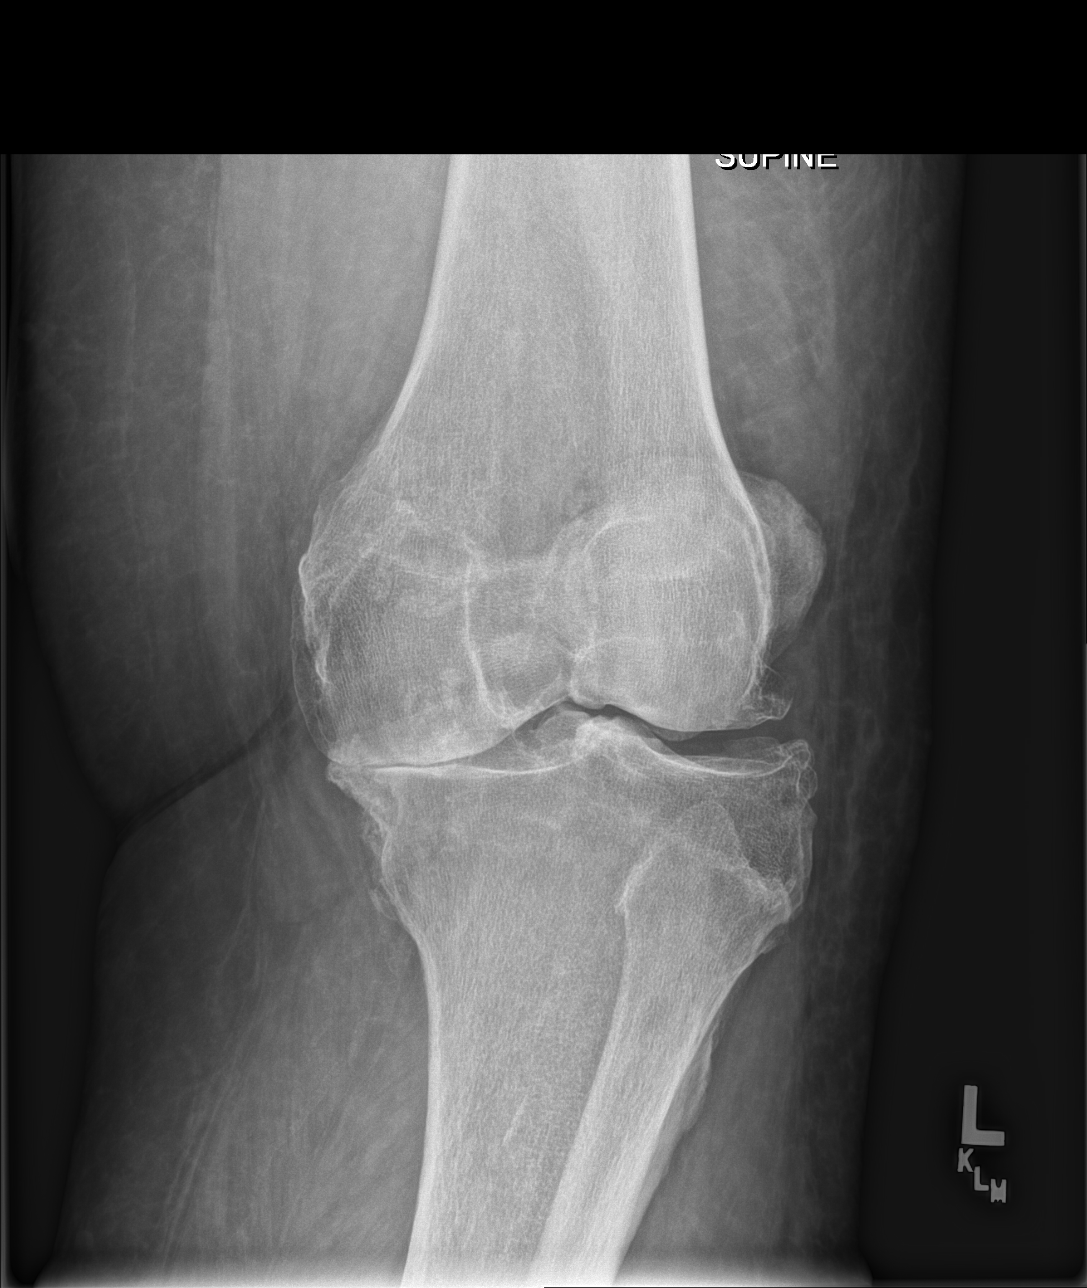

[w knee lat left]
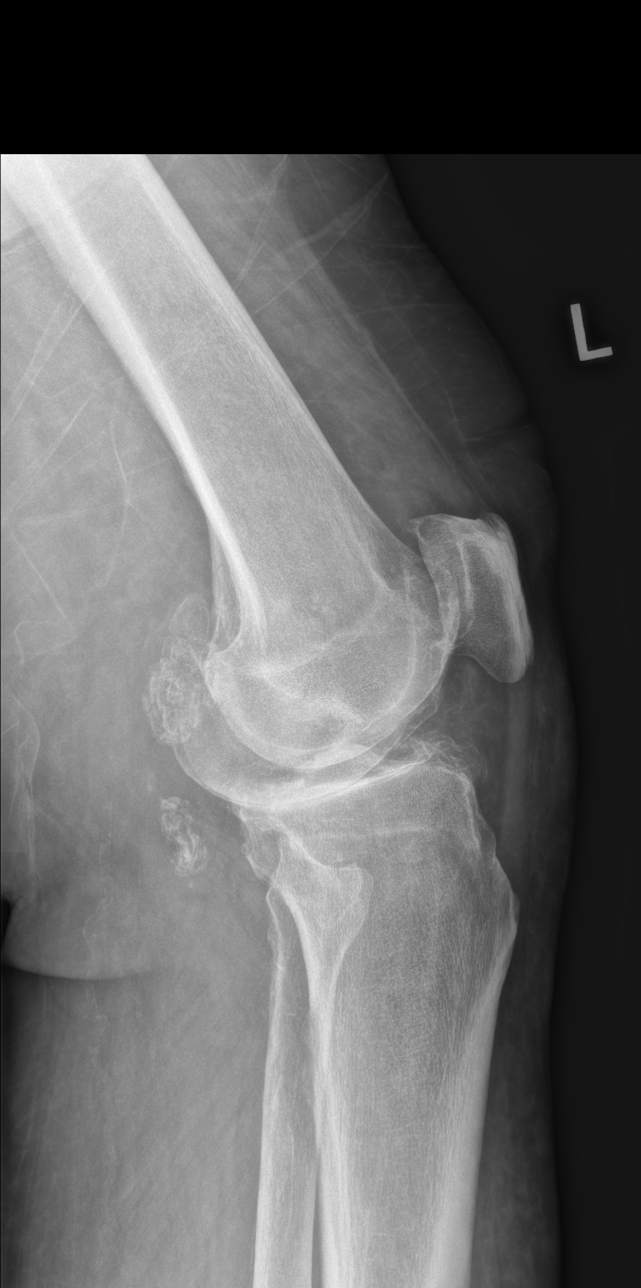

[4 of 4 positions shown; findings below may reference images not displayed]

FINDINGS: No fracture or malalignment. Severe arthritis of the medial joint
space with bone-on-bone appearance, sclerosis and osteophyte. Mild
degenerative osteophytes at the lateral joint with mild
patellofemoral degenerative change. Trace knee effusion. Possible
calcified loose bodies at the posterior joint space.
IMPRESSION: 1. No acute osseous abnormality
2. Tricompartment arthritis most advanced involving the medial joint
space. Possible calcified loose bodies at the posterior joint.

## 2022-03-21 ENCOUNTER — Other Ambulatory Visit: Payer: Self-pay

## 2022-03-21 ENCOUNTER — Telehealth: Payer: Self-pay

## 2022-03-21 ENCOUNTER — Inpatient Hospital Stay: Payer: Medicare Other | Attending: Oncology | Admitting: Oncology

## 2022-03-21 ENCOUNTER — Telehealth: Payer: Self-pay | Admitting: *Deleted

## 2022-03-21 VITALS — BP 82/58 | HR 55 | Temp 98.2°F | Resp 16

## 2022-03-21 DIAGNOSIS — K219 Gastro-esophageal reflux disease without esophagitis: Secondary | ICD-10-CM | POA: Diagnosis not present

## 2022-03-21 DIAGNOSIS — J90 Pleural effusion, not elsewhere classified: Secondary | ICD-10-CM | POA: Diagnosis not present

## 2022-03-21 DIAGNOSIS — I272 Pulmonary hypertension, unspecified: Secondary | ICD-10-CM | POA: Diagnosis not present

## 2022-03-21 DIAGNOSIS — C771 Secondary and unspecified malignant neoplasm of intrathoracic lymph nodes: Secondary | ICD-10-CM | POA: Diagnosis not present

## 2022-03-21 DIAGNOSIS — C679 Malignant neoplasm of bladder, unspecified: Secondary | ICD-10-CM | POA: Insufficient documentation

## 2022-03-21 DIAGNOSIS — E43 Unspecified severe protein-calorie malnutrition: Secondary | ICD-10-CM | POA: Diagnosis not present

## 2022-03-21 DIAGNOSIS — Z79899 Other long term (current) drug therapy: Secondary | ICD-10-CM | POA: Insufficient documentation

## 2022-03-21 DIAGNOSIS — I4891 Unspecified atrial fibrillation: Secondary | ICD-10-CM | POA: Diagnosis not present

## 2022-03-21 DIAGNOSIS — Z6828 Body mass index (BMI) 28.0-28.9, adult: Secondary | ICD-10-CM | POA: Diagnosis not present

## 2022-03-21 DIAGNOSIS — N1832 Chronic kidney disease, stage 3b: Secondary | ICD-10-CM | POA: Diagnosis not present

## 2022-03-21 DIAGNOSIS — D630 Anemia in neoplastic disease: Secondary | ICD-10-CM | POA: Diagnosis not present

## 2022-03-21 DIAGNOSIS — E785 Hyperlipidemia, unspecified: Secondary | ICD-10-CM | POA: Diagnosis not present

## 2022-03-21 DIAGNOSIS — I5032 Chronic diastolic (congestive) heart failure: Secondary | ICD-10-CM | POA: Diagnosis not present

## 2022-03-21 DIAGNOSIS — R7303 Prediabetes: Secondary | ICD-10-CM | POA: Diagnosis not present

## 2022-03-21 DIAGNOSIS — I872 Venous insufficiency (chronic) (peripheral): Secondary | ICD-10-CM | POA: Diagnosis not present

## 2022-03-21 DIAGNOSIS — C670 Malignant neoplasm of trigone of bladder: Secondary | ICD-10-CM | POA: Diagnosis not present

## 2022-03-21 DIAGNOSIS — I959 Hypotension, unspecified: Secondary | ICD-10-CM | POA: Diagnosis not present

## 2022-03-21 DIAGNOSIS — C7800 Secondary malignant neoplasm of unspecified lung: Secondary | ICD-10-CM | POA: Insufficient documentation

## 2022-03-21 DIAGNOSIS — I714 Abdominal aortic aneurysm, without rupture, unspecified: Secondary | ICD-10-CM | POA: Diagnosis not present

## 2022-03-21 DIAGNOSIS — Z9989 Dependence on other enabling machines and devices: Secondary | ICD-10-CM | POA: Diagnosis not present

## 2022-03-21 DIAGNOSIS — G4733 Obstructive sleep apnea (adult) (pediatric): Secondary | ICD-10-CM | POA: Diagnosis not present

## 2022-03-21 NOTE — Progress Notes (Signed)
Hematology and Oncology Follow Up Visit  Patrick Bishop [id number] 09-13-1941 81 y.o. 03/21/2022 10:07 AM Patrick Bishop, MDKoirala, [name], MD   Principle Diagnosis: 81 year old man with bladder cancer diagnosed in January 2022.  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   Prior Therapy:  He is status post TURBT completed on October 21, 2020 which showed high-grade urothelial carcinoma with muscle invasion.  Radiation and weekly carboplatin started on December 24, 2020.  He completed 6 cycles of carboplatin on Jan 27, 2021.    Current therapy: Active surveillance.  Interim History: Patrick Bishop is here for a follow-up visit.  Since last visit, he was hospitalized on March 08, 2022 with symptoms of shortness of breath.  CT scan of the chest at that time showed pleural effusion as well as pulmonary nodules consistent with metastatic disease.  He has underwent thoracentesis and the pathology confirmed the presence of malignant cells consistent with metastatic carcinoma.  Since his discharge, he continues to decline to lose weight rapidly.  His oral intake continues to be diminished.  His performance status is very poor and for the most part chair and bedbound.     Medications: Reviewed without changes. Current Outpatient Medications  Medication Sig Dispense Refill   atorvastatin (LIPITOR) 10 MG tablet TAKE 1 TABLET DAILY (Patient taking differently: Take 10 mg by mouth daily.) 90 tablet 3   docusate sodium (COLACE) 100 MG capsule Take 1 capsule (100 mg total) by mouth 2 (two) times daily. 10 capsule 0   feeding supplement (ENSURE ENLIVE / ENSURE PLUS) LIQD Take 237 mLs by mouth 3 (three) times daily between meals. 237 mL 12   HYDROcodone-acetaminophen (NORCO/VICODIN) 5-325 MG tablet Take 1 tablet by mouth every 6 (six) hours as needed for moderate pain. 12 tablet 0   midodrine (PROAMATINE) 5 MG tablet Take 1 tablet (5 mg total) by  mouth 3 (three) times daily with meals. 90 tablet 0   pantoprazole (PROTONIX) 40 MG tablet Take 1 tablet (40 mg total) by mouth 2 (two) times daily. 60 tablet 11   tamsulosin (FLOMAX) 0.4 MG CAPS capsule Take 0.4 mg by mouth daily.     XARELTO 20 MG TABS tablet Take 1 tablet (20 mg total) by mouth daily with supper. 90 tablet 3   No current facility-administered medications for this visit.     Allergies:  Allergies  Allergen Reactions   Aldactone [Spironolactone] Other (See Comments)    Caused dizziness      Physical Exam: Blood pressure (!) 82/58, pulse (!) 55, temperature 98.2 F (36.8 C), temperature source Temporal, resp. rate 16, SpO2 92 %.   ECOG:  3   General appearance: Ill-appearing man without active distress. Head: Normocephalic without any trauma Oropharynx: Mucous membranes are moist and pink without any thrush or ulcers. Eyes: Pupils are equal and round reactive to light. Lymph nodes: No cervical, supraclavicular, inguinal or axillary lymphadenopathy.   Heart:regular rate and rhythm.  S1 and S2 without leg edema. Lung: Clear without any rhonchi or wheezes.  No dullness to percussion. Abdomin: Soft, nontender, nondistended with good bowel sounds.  No hepatosplenomegaly. Musculoskeletal: No joint deformity or effusion.  Full range of motion noted. Neurological: No deficits noted on motor, sensory and deep tendon reflex exam. Skin: No petechial rash or dryness.  Appeared moist.  Psychiatric: Mood and affect appeared appropriate.     Lab Results: Lab Results  Component Value Date   WBC 7.2 03/09/2022  HGB 11.6 (L) 03/09/2022   HCT 36.1 (L) 03/09/2022   MCV 96.8 03/09/2022   PLT 257 03/09/2022     Chemistry      Component Value Date/Time   NA 141 03/11/2022 0154   NA 141 07/22/2021 1230   K 4.5 03/11/2022 0154   CL 102 03/11/2022 0154   CO2 31 03/11/2022 0154   BUN 23 03/11/2022 0154   BUN 36 (H) 07/22/2021 1230   CREATININE 1.47 (H) 03/11/2022  0154   CREATININE 2.53 (H) 10/07/2021 0948      Component Value Date/Time   CALCIUM 8.7 (L) 03/11/2022 0154   ALKPHOS 76 03/08/2022 1226   AST 13 (L) 03/08/2022 1226   AST 10 (L) 10/07/2021 0948   ALT 9 03/08/2022 1226   ALT 9 10/07/2021 0948   BILITOT 1.4 (H) 03/08/2022 1226   BILITOT 0.7 10/07/2021 0948          Impression and Plan:   81 year old man with:   1.   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    The natural course of this disease was reviewed today with the patient and his family.  He has experienced rather rapid functional decline and would not be a great candidate for anticancer treatment.  Any treatment for his cancer is palliative and would less likely be beneficial at this time.  Given these findings, I have recommended proceeding with hospice at this time.      2.  Pleural effusion: Repeat thoracentesis can be arranged for in the future he becomes more symptomatic.  3.  Prognosis and goals of care: His prognosis is poor with limited life expectancy of less than 6 months.   4.  Follow-up: No follow-up will be arranged at this time I will rely on hospice for updates.   30  minutes were spent on this encounter.  The time was dedicated to reviewing pathology results, treatment choices, discussing prognosis and future plan of care reviewed. Patrick Hose, MD 6/26/202310:07 AM

## 2022-03-21 NOTE — Telephone Encounter (Signed)
Referral called to [facility] for Hospice services.

## 2022-03-23 ENCOUNTER — Other Ambulatory Visit: Payer: Self-pay | Admitting: *Deleted

## 2022-03-23 DIAGNOSIS — C7800 Secondary malignant neoplasm of unspecified lung: Secondary | ICD-10-CM | POA: Diagnosis not present

## 2022-03-23 DIAGNOSIS — C771 Secondary and unspecified malignant neoplasm of intrathoracic lymph nodes: Secondary | ICD-10-CM | POA: Diagnosis not present

## 2022-03-23 DIAGNOSIS — I4891 Unspecified atrial fibrillation: Secondary | ICD-10-CM | POA: Diagnosis not present

## 2022-03-23 DIAGNOSIS — C679 Malignant neoplasm of bladder, unspecified: Secondary | ICD-10-CM | POA: Diagnosis not present

## 2022-03-23 DIAGNOSIS — E43 Unspecified severe protein-calorie malnutrition: Secondary | ICD-10-CM | POA: Diagnosis not present

## 2022-03-23 DIAGNOSIS — D630 Anemia in neoplastic disease: Secondary | ICD-10-CM | POA: Diagnosis not present

## 2022-03-24 NOTE — Patient Outreach (Signed)
[name]) Care Management Telephonic RN Care Manager Note   03/23/2022 Name:  Patrick Bishop MRN:  [id number] DOB:  09/26/1941  Summary: Patrick Bishop with caregiver [name] who indicates no additional bp issues however pt is now with hospice services for comfort measure with approximately three month for end of life measures.  Recommendations/Changes made from today's visit: Case will be closed via [facility] services.  Subjective: Patrick Bishop is an 81 y.o. year old male who is a primary patient of [name], [name], MD. The care management team was consulted for assistance with care management and/or care coordination needs.    Telephonic RN Care Manager completed Telephone Visit today.  Objective:   Medications Reviewed Today     Reviewed by [name], CPhT (Pharmacy Technician) on 03/08/22 at [place] List Status: Complete   Medication Order Taking? Sig Documenting Provider Last Dose Status Informant  acetaminophen (TYLENOL) 325 MG tablet [id number] No Take 2 tablets (650 mg total) by mouth every 6 (six) hours as needed for mild pain (or Fever >/= 101).  Patient not taking: Reported on 03/08/2022   [name], MD Not Taking Active Spouse/Significant Other, Pharmacy Records  atorvastatin (LIPITOR) 10 MG tablet [id number] Yes TAKE 1 TABLET DAILY  Patient taking differently: Take 10 mg by mouth daily.   [name], MD 03/07/2022 Active Spouse/Significant Other, Pharmacy Records  docusate sodium (COLACE) 100 MG capsule [id number] Yes Take 1 capsule (100 mg total) by mouth 2 (two) times daily. [name], MD 03/08/2022 Active Spouse/Significant Other, Pharmacy Records  feeding supplement (ENSURE ENLIVE / ENSURE PLUS) LIQD [id number] Yes Take 237 mLs by mouth 3 (three) times daily between meals. [name], MD 03/07/2022 Active Spouse/Significant Other, Pharmacy Records           Med Note [name]   Tue Mar 08, 2022  3:42 PM)     HYDROcodone-acetaminophen (NORCO/VICODIN) 5-325 MG tablet [id number] Yes Take 1 tablet by mouth every 6 (six) hours as needed for moderate pain. [name], MD 03/08/2022 Active Spouse/Significant Other, Pharmacy Records           Med Note [name]   Tue Mar 08, 2022  3:42 PM)    midodrine (PROAMATINE) 2.5 MG tablet [id number] Yes Take 1 tablet (2.5 mg total) by mouth 2 (two) times daily with a meal. [name], MD 03/08/2022 Active Spouse/Significant Other, Pharmacy Records  pantoprazole (PROTONIX) 40 MG tablet [id number] Yes Take 1 tablet (40 mg total) by mouth 2 (two) times daily. [name], MD 03/08/2022 Active Spouse/Significant Other, Pharmacy Records  potassium chloride SA (KLOR-CON M) 20 MEQ tablet [id number] Yes Take 40 mEq by mouth 2 (two) times daily. [provider] 03/08/2022 Active Spouse/Significant Other, Pharmacy Records  prochlorperazine (COMPAZINE) 10 MG tablet [id number] No TAKE 1 TABLET(10 MG) BY MOUTH EVERY 6 HOURS AS NEEDED FOR NAUSEA OR VOMITING  Patient not taking: Reported on 03/08/2022   [name], MD Not Taking Active Spouse/Significant Other, Pharmacy Records  tamsulosin [facility]) 0.4 MG CAPS capsule [id number] Yes Take 0.4 mg by mouth daily. [provider] 03/08/2022 Active Spouse/Significant Other, Pharmacy Records  torsemide (DEMADEX) 20 MG tablet [id number] Yes Take 20 mg  by mouth daily , may take an additional 20 mg if needed for swelling or dyspnea  Patient taking differently: Take 20 mg by mouth See admin instructions. Take 20 mg by mouth daily, may take an additional 20 mg if needed for swelling or dyspnea  [name], MD 03/08/2022 Active Spouse/Significant Other, Pharmacy Records  XARELTO 20 MG TABS tablet [id number] Yes Take 1 tablet (20 mg total) by mouth daily with supper. [name], MD 03/07/2022 1800 Active Spouse/Significant Other, Pharmacy Records           Med Note Caryn Section, [place] A   Tue Nov 02, 2021  10:07 AM)               SDOH:  (Social Determinants of Health) assessments and interventions performed:     Care Plan  Review of patient past medical history, allergies, medications, health status, including review of consultants reports, laboratory and other test data, was performed as part of comprehensive evaluation for care management services.   Care Plan : RN Care Manager Plan of Care  Updates made by Tobi Bastos, RN since 03/24/2022 12:00 AM     Problem: Knowledge Deficit related to Hypertension   Priority: High     Long-Range Goal: Development of a Plan of Care for management of Hypertension Completed 03/24/2022  Start Date: 10/15/2021  Expected End Date: 08/25/2022  This Visit's Progress: On track  Recent Progress: On track  Priority: High  Note:   Current Barriers:  Knowledge Deficits related to plan of care for management of HTN   RNCM Clinical Goal(s):  Patient will verbalize understanding of plan for management of HTN as evidenced by teach-back method of understanding take all medications exactly as prescribed and will call provider for medication related questions as evidenced by self report and chart notification  through collaboration with RN Care manager, provider, and care team.   Interventions: Inter-disciplinary care team collaboration (see longitudinal plan of care) Evaluation of current treatment plan related to  self management and patient's adherence to plan as established by provider   Hypertension Interventions:  (Status:  Goal on track:  Yes.) Long Term Goal Last practice recorded BP readings:  BP Readings from Last 3 Encounters:  10/07/21 (!) 90/58  09/21/21 (!) 107/58  08/13/21 106/71  Most recent eGFR/CrCl:  Lab Results  Component Value Date   EGFR 27 (L) 07/22/2021    No components found for: CRCL  Evaluation of current treatment plan related to hypertension self management and patient's adherence to plan as established by  provider Provided education to patient re: stroke prevention, s/s of heart attack and stroke Reviewed medications with patient and discussed importance of compliance Provided assistance with obtaining home blood pressure monitor via [facility] services will mail this DME to pt's confirmed address; Discussed plans with patient for ongoing care management follow up and provided patient with direct contact information for care management team Advised patient, providing education and rationale, to monitor blood pressure daily and record, calling PCP for findings outside established parameters Reviewed scheduled/upcoming provider appointments including:  Discussed complications of poorly controlled blood pressure such as heart disease, stroke, circulatory complications, vision complications, kidney impairment, sexual dysfunction Screening for signs and symptoms of depression related to chronic disease state  Assessed social determinant of health barriers  Update 2/20-Spoke with pt and permitted to speak with the caregiver in the home Aldean Ast in the home. Reports pt is doing well with ongoing [facility] in the home for PT/OT/RN/Aide services. Denies any falls however pt is very limited with his mobility unable to stand or walk at this time. Reports good blood pressures from the providers with no acute issues or events. Will reiterate on the discussed plan of care and continue to offer available services for  Education officer, museum or pharmacy with ongoing needs however encouraged caregiver to outreach to the involved agency while active with [facility] if these services are available. Verified all appointments with a recent follow with urologist post op stent placed. Update 3/3 Post op hospital visit. Pt reports he is doing well and provided permission to speak with caregiver today [name]) who indicated pt has been very weak and HHPT/OT has been post-prone until pt regains his strength. States the [facility] continues to visit  with a pending appointment on Monday, infectious provider on next Friday and nephrologist on Tuesday to follow up with additional labs concerning his infection. Pt started back on antibiotics 3 days ago. Reports blood pressures readings have been good with no acute readings since the last conversation. Update 4/3:Live-in significant other [name]) reports pt is doing "really well" with ongoing HHPT/OT/RN with [facility] however loss the aide but will inquire on obtaining another for ADLs. Reports pt's bp remains normal with no acute readings or issues. Reports pt no longer has a PICC line discontinued with no additional antibiotics. Reviewed the plan of care and encouraged ongoing adherence with available resources from [place] and pharmacy if needed. No additional needs at this time. Remains receptive with monthly follow up calls. Update 5/3: Spoke with live-in companion Aldean Ast) concerning pt's ongoing management of care. Reports pt is doing well however recent issues was hypotension systolic reads 18-84. CAD provider consulted and pt treated which was started and systolic pressures now around 110 much improved. Also reports increase in activity with visiting the facility's gym qod with no issues. Will review the plan of care and continue to encourage adherence related to HTN management of care. No needs to address today and all inquires and questions addressed accordingly. Update 02/24/2022: Spoke with Aldean Ast who indicates pt was having issues with his blood pressures and the visiting nurse address with provider who increased his medications. Reports the pt's blood pressures readings have improved with the reported systolic as high as 166 with no reported symptoms at this time. Address all inquires and no additional needs at this. Verified pt received the requested AD packet with no inquires related. Reviewed and discussed the current plan of care and continue to encouraged adherence with  all discussed. No further needs today. Will continue monthly follow up calls and alert the provider on pt's ongoing disposition with [facility] services.  Patient Goals/Self-Care Activities: Take all medications as prescribed Attend all scheduled provider appointments Call pharmacy for medication refills 3-7 days in advance of running out of medications Attend church or other social activities Perform all self care activities independently  Perform IADL's (shopping, preparing meals, housekeeping, managing finances) independently Call provider office for new concerns or questions  check blood pressure weekly choose a place to take my blood pressure (home, clinic or office, retail store) write blood pressure results in a log or diary learn about high blood pressure keep a blood pressure log take blood pressure log to all doctor appointments call doctor for signs and symptoms of high blood pressure keep all doctor appointments take medications for blood pressure exactly as prescribed report new symptoms to your doctor  Follow Up Plan:  Telephone follow up appointment with care management team member scheduled for:  July 2023 The patient has been provided with contact information for the care management team and has been advised to call with any health related questions or concerns.    6/28 Spoke with caregiver who indicates pt is with Hospice  now for end of life comfort measures. Case will be closed at this time.        Raina Mina, RN Care Management Coordinator [place] Office [phone]

## 2022-03-25 DIAGNOSIS — E43 Unspecified severe protein-calorie malnutrition: Secondary | ICD-10-CM | POA: Diagnosis not present

## 2022-03-25 DIAGNOSIS — D630 Anemia in neoplastic disease: Secondary | ICD-10-CM | POA: Diagnosis not present

## 2022-03-25 DIAGNOSIS — I4891 Unspecified atrial fibrillation: Secondary | ICD-10-CM | POA: Diagnosis not present

## 2022-03-25 DIAGNOSIS — C771 Secondary and unspecified malignant neoplasm of intrathoracic lymph nodes: Secondary | ICD-10-CM | POA: Diagnosis not present

## 2022-03-25 DIAGNOSIS — C7800 Secondary malignant neoplasm of unspecified lung: Secondary | ICD-10-CM | POA: Diagnosis not present

## 2022-03-25 DIAGNOSIS — C679 Malignant neoplasm of bladder, unspecified: Secondary | ICD-10-CM | POA: Diagnosis not present

## 2022-03-26 DIAGNOSIS — E785 Hyperlipidemia, unspecified: Secondary | ICD-10-CM | POA: Diagnosis not present

## 2022-03-26 DIAGNOSIS — I5032 Chronic diastolic (congestive) heart failure: Secondary | ICD-10-CM | POA: Diagnosis not present

## 2022-03-26 DIAGNOSIS — C679 Malignant neoplasm of bladder, unspecified: Secondary | ICD-10-CM | POA: Diagnosis not present

## 2022-03-26 DIAGNOSIS — I872 Venous insufficiency (chronic) (peripheral): Secondary | ICD-10-CM | POA: Diagnosis not present

## 2022-03-26 DIAGNOSIS — I959 Hypotension, unspecified: Secondary | ICD-10-CM | POA: Diagnosis not present

## 2022-03-26 DIAGNOSIS — Z6828 Body mass index (BMI) 28.0-28.9, adult: Secondary | ICD-10-CM | POA: Diagnosis not present

## 2022-03-26 DIAGNOSIS — C771 Secondary and unspecified malignant neoplasm of intrathoracic lymph nodes: Secondary | ICD-10-CM | POA: Diagnosis not present

## 2022-03-26 DIAGNOSIS — I272 Pulmonary hypertension, unspecified: Secondary | ICD-10-CM | POA: Diagnosis not present

## 2022-03-26 DIAGNOSIS — C7800 Secondary malignant neoplasm of unspecified lung: Secondary | ICD-10-CM | POA: Diagnosis not present

## 2022-03-26 DIAGNOSIS — I4891 Unspecified atrial fibrillation: Secondary | ICD-10-CM | POA: Diagnosis not present

## 2022-03-26 DIAGNOSIS — E43 Unspecified severe protein-calorie malnutrition: Secondary | ICD-10-CM | POA: Diagnosis not present

## 2022-03-26 DIAGNOSIS — R7303 Prediabetes: Secondary | ICD-10-CM | POA: Diagnosis not present

## 2022-03-26 DIAGNOSIS — D630 Anemia in neoplastic disease: Secondary | ICD-10-CM | POA: Diagnosis not present

## 2022-03-26 DIAGNOSIS — I714 Abdominal aortic aneurysm, without rupture, unspecified: Secondary | ICD-10-CM | POA: Diagnosis not present

## 2022-03-26 DIAGNOSIS — G4733 Obstructive sleep apnea (adult) (pediatric): Secondary | ICD-10-CM | POA: Diagnosis not present

## 2022-03-26 DIAGNOSIS — Z9989 Dependence on other enabling machines and devices: Secondary | ICD-10-CM | POA: Diagnosis not present

## 2022-03-26 DIAGNOSIS — N1832 Chronic kidney disease, stage 3b: Secondary | ICD-10-CM | POA: Diagnosis not present

## 2022-03-26 DIAGNOSIS — K219 Gastro-esophageal reflux disease without esophagitis: Secondary | ICD-10-CM | POA: Diagnosis not present

## 2022-03-28 ENCOUNTER — Ambulatory Visit: Payer: Medicare Other | Admitting: Physician Assistant

## 2022-03-29 DIAGNOSIS — C771 Secondary and unspecified malignant neoplasm of intrathoracic lymph nodes: Secondary | ICD-10-CM | POA: Diagnosis not present

## 2022-03-29 DIAGNOSIS — D630 Anemia in neoplastic disease: Secondary | ICD-10-CM | POA: Diagnosis not present

## 2022-03-29 DIAGNOSIS — C7800 Secondary malignant neoplasm of unspecified lung: Secondary | ICD-10-CM | POA: Diagnosis not present

## 2022-03-29 DIAGNOSIS — I4891 Unspecified atrial fibrillation: Secondary | ICD-10-CM | POA: Diagnosis not present

## 2022-03-29 DIAGNOSIS — E43 Unspecified severe protein-calorie malnutrition: Secondary | ICD-10-CM | POA: Diagnosis not present

## 2022-03-29 DIAGNOSIS — C679 Malignant neoplasm of bladder, unspecified: Secondary | ICD-10-CM | POA: Diagnosis not present

## 2022-03-30 DIAGNOSIS — C771 Secondary and unspecified malignant neoplasm of intrathoracic lymph nodes: Secondary | ICD-10-CM | POA: Diagnosis not present

## 2022-03-30 DIAGNOSIS — D630 Anemia in neoplastic disease: Secondary | ICD-10-CM | POA: Diagnosis not present

## 2022-03-30 DIAGNOSIS — C679 Malignant neoplasm of bladder, unspecified: Secondary | ICD-10-CM | POA: Diagnosis not present

## 2022-03-30 DIAGNOSIS — E43 Unspecified severe protein-calorie malnutrition: Secondary | ICD-10-CM | POA: Diagnosis not present

## 2022-03-30 DIAGNOSIS — C7800 Secondary malignant neoplasm of unspecified lung: Secondary | ICD-10-CM | POA: Diagnosis not present

## 2022-03-30 DIAGNOSIS — I4891 Unspecified atrial fibrillation: Secondary | ICD-10-CM | POA: Diagnosis not present

## 2022-04-26 DEATH — deceased

## 2022-06-28 ENCOUNTER — Ambulatory Visit: Payer: Medicare Other | Admitting: Cardiology
